# Patient Record
Sex: Male | Born: 1945 | Race: White | Hispanic: No | Marital: Married
Health system: Midwestern US, Community
[De-identification: ages and names within clinical notes are randomized; demographics above are authoritative.]

## PROBLEM LIST (undated history)

## (undated) DIAGNOSIS — I10 Essential (primary) hypertension: Secondary | ICD-10-CM

## (undated) DIAGNOSIS — N179 Acute kidney failure, unspecified: Secondary | ICD-10-CM

## (undated) DIAGNOSIS — Z515 Encounter for palliative care: Secondary | ICD-10-CM

## (undated) DIAGNOSIS — A0472 Enterocolitis due to Clostridium difficile, not specified as recurrent: Secondary | ICD-10-CM

## (undated) DIAGNOSIS — I2510 Atherosclerotic heart disease of native coronary artery without angina pectoris: Secondary | ICD-10-CM

## (undated) DIAGNOSIS — Z8639 Personal history of other endocrine, nutritional and metabolic disease: Secondary | ICD-10-CM

## (undated) DIAGNOSIS — D472 Monoclonal gammopathy: Secondary | ICD-10-CM

## (undated) DIAGNOSIS — R339 Retention of urine, unspecified: Secondary | ICD-10-CM

## (undated) DIAGNOSIS — Z7189 Other specified counseling: Secondary | ICD-10-CM

## (undated) DIAGNOSIS — R1010 Upper abdominal pain, unspecified: Secondary | ICD-10-CM

## (undated) DIAGNOSIS — R1084 Generalized abdominal pain: Secondary | ICD-10-CM

## (undated) DIAGNOSIS — E119 Type 2 diabetes mellitus without complications: Secondary | ICD-10-CM

## (undated) DIAGNOSIS — R531 Weakness: Secondary | ICD-10-CM

## (undated) DIAGNOSIS — D72829 Elevated white blood cell count, unspecified: Secondary | ICD-10-CM

## (undated) DIAGNOSIS — E785 Hyperlipidemia, unspecified: Secondary | ICD-10-CM

## (undated) DIAGNOSIS — C801 Malignant (primary) neoplasm, unspecified: Secondary | ICD-10-CM

## (undated) HISTORY — DX: Hyperlipidemia, unspecified: E78.5

## (undated) HISTORY — DX: Weakness: R53.1

## (undated) HISTORY — DX: Encounter for palliative care: Z51.5

## (undated) HISTORY — DX: Atherosclerotic heart disease of native coronary artery without angina pectoris: I25.10

## (undated) HISTORY — DX: Retention of urine, unspecified: R33.9

## (undated) HISTORY — DX: Generalized abdominal pain: R10.84

## (undated) HISTORY — DX: Enterocolitis due to Clostridium difficile, not specified as recurrent: A04.72

## (undated) HISTORY — DX: Other specified counseling: Z71.89

## (undated) HISTORY — DX: Upper abdominal pain, unspecified: R10.10

## (undated) HISTORY — DX: Acute kidney failure, unspecified: N17.9

## (undated) HISTORY — DX: Essential (primary) hypertension: I10

## (undated) HISTORY — DX: Elevated white blood cell count, unspecified: D72.829

## (undated) HISTORY — DX: Monoclonal gammopathy: D47.2

## (undated) HISTORY — DX: Personal history of other endocrine, nutritional and metabolic disease: Z86.39

---

## 2003-11-23 ENCOUNTER — Encounter: Admission: RE | Admit: 2003-11-23 | Discharge: 2003-11-23 | Payer: Self-pay | Admitting: Internal Medicine

## 2004-01-07 DIAGNOSIS — I2510 Atherosclerotic heart disease of native coronary artery without angina pectoris: Secondary | ICD-10-CM | POA: Insufficient documentation

## 2004-01-07 HISTORY — PX: CARDIAC CATHETERIZATION: SHX172

## 2004-01-07 HISTORY — DX: Atherosclerotic heart disease of native coronary artery without angina pectoris: I25.10

## 2004-04-15 ENCOUNTER — Emergency Department (HOSPITAL_COMMUNITY): Admission: EM | Admit: 2004-04-15 | Discharge: 2004-04-15 | Payer: Self-pay | Admitting: Emergency Medicine

## 2004-05-17 ENCOUNTER — Encounter: Payer: Self-pay | Admitting: Cardiology

## 2004-05-17 ENCOUNTER — Ambulatory Visit (HOSPITAL_COMMUNITY): Admission: RE | Admit: 2004-05-17 | Discharge: 2004-05-18 | Payer: Self-pay | Admitting: Cardiovascular Disease

## 2005-01-06 HISTORY — PX: CORONARY ANGIOPLASTY WITH STENT PLACEMENT: SHX49

## 2006-03-29 ENCOUNTER — Emergency Department (HOSPITAL_COMMUNITY): Admission: EM | Admit: 2006-03-29 | Discharge: 2006-03-30 | Payer: Self-pay | Admitting: Emergency Medicine

## 2011-07-21 DIAGNOSIS — E119 Type 2 diabetes mellitus without complications: Secondary | ICD-10-CM | POA: Diagnosis present

## 2011-07-21 DIAGNOSIS — I10 Essential (primary) hypertension: Secondary | ICD-10-CM | POA: Insufficient documentation

## 2011-07-21 DIAGNOSIS — I2510 Atherosclerotic heart disease of native coronary artery without angina pectoris: Secondary | ICD-10-CM

## 2011-07-21 HISTORY — DX: Atherosclerotic heart disease of native coronary artery without angina pectoris: I25.10

## 2011-07-21 HISTORY — DX: Type 2 diabetes mellitus without complications: E11.9

## 2011-07-21 HISTORY — DX: Essential (primary) hypertension: I10

## 2011-08-29 ENCOUNTER — Ambulatory Visit (INDEPENDENT_AMBULATORY_CARE_PROVIDER_SITE_OTHER): Payer: BC Managed Care – PPO | Admitting: Cardiology

## 2011-08-29 ENCOUNTER — Encounter: Payer: Self-pay | Admitting: Cardiology

## 2011-08-29 VITALS — BP 148/94 | HR 73 | Ht 71.0 in | Wt 170.0 lb

## 2011-08-29 DIAGNOSIS — E785 Hyperlipidemia, unspecified: Secondary | ICD-10-CM

## 2011-08-29 DIAGNOSIS — E119 Type 2 diabetes mellitus without complications: Secondary | ICD-10-CM

## 2011-08-29 DIAGNOSIS — I2510 Atherosclerotic heart disease of native coronary artery without angina pectoris: Secondary | ICD-10-CM

## 2011-08-29 DIAGNOSIS — I10 Essential (primary) hypertension: Secondary | ICD-10-CM

## 2011-08-29 DIAGNOSIS — Z8639 Personal history of other endocrine, nutritional and metabolic disease: Secondary | ICD-10-CM

## 2011-08-29 DIAGNOSIS — Z862 Personal history of diseases of the blood and blood-forming organs and certain disorders involving the immune mechanism: Secondary | ICD-10-CM

## 2011-08-29 DIAGNOSIS — E7800 Pure hypercholesterolemia, unspecified: Secondary | ICD-10-CM

## 2011-08-29 MED ORDER — AMLODIPINE BESYLATE 5 MG PO TABS: 5.0000 mg | ORAL_TABLET | Freq: Every day | ORAL | Status: DC

## 2011-08-29 MED ORDER — ATORVASTATIN CALCIUM 40 MG PO TABS: 40.0000 mg | ORAL_TABLET | Freq: Every day | ORAL | Status: DC

## 2011-08-29 MED ORDER — METFORMIN HCL 1000 MG PO TABS: 1000.0000 mg | ORAL_TABLET | Freq: Two times a day (BID) | ORAL | Status: DC

## 2011-08-29 NOTE — Progress Notes (Signed)
Douglas Rose. Date of Birth: 05-17-1945 Medical Record #[id number]  History of Present Illness: Mr. Delmonaco is seen at the request of Dr. Kathrynn Running for evaluation of coronary disease. 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 In 2006 he presented with symptoms of fatigue on exertion and chest pain. He had an abnormal stress test which led to cardiac catheterization. This demonstrated a occlusion of the right coronary with collateral flow. He also had a 95% stenosis in the proximal LAD. The LAD was stented with a 3.0 x 18 mm Cypher stent. He has been lost to cardiac followup since then. He has multiple cardiac risk factors including diabetes, hypertension, and hyperlipidemia. He states that he has been feeling well. He denies any significant fatigue, chest pain, or shortness of breath. His energy level is good. He has no history of stroke or TIA. His father did die of a stroke at age 62.  Current Outpatient Prescriptions on File Prior to Visit  Medication Sig Dispense Refill  . lisinopril-hydrochlorothiazide (PRINZIDE,ZESTORETIC) 20-25 MG per tablet Take 1 tablet by mouth daily.       . metoprolol succinate (TOPROL-XL) 50 MG 24 hr tablet Take 50 mg by mouth daily.       [name] amLODipine (NORVASC) 5 MG tablet Take 1 tablet (5 mg total) by mouth daily.  30 tablet  11  . atorvastatin (LIPITOR) 40 MG tablet Take 1 tablet (40 mg total) by mouth daily.  30 tablet  11  . metFORMIN (GLUCOPHAGE) 1000 MG tablet Take 1 tablet (1,000 mg total) by mouth 2 (two) times daily with a meal.  180 tablet  3    No Known Allergies  Past Medical History  Diagnosis Date  . H/O non-insulin dependent diabetes mellitus   . Hypertension   . Hyperlipidemia   . CAD (coronary artery disease) 2006    stent LAD 3.0x18 Cypher, occluded RCA    Past Surgical History  Procedure Date  . Cardiac catheterization 2006    History  Smoking status  . Never Smoker     Smokeless tobacco  . Not on file    History  Alcohol Use No    Family History  Problem Relation Age of Onset  . Arrhythmia Mother   . Diabetes Sister   . Diabetes Brother   . Diabetes Brother     Review of Systems: As noted in history of present illness. No history of diabetic neuropathy or nephropathy..  All other systems were reviewed and are negative.  Physical Exam: BP 148/94  Pulse 73  Ht 5\' 11"  (1.803 m)  Wt 170 lb (77.111 kg)  BMI 23.71 kg/m2 He is a pleasant black male in no acute distress.The patient is alert and oriented x 3.  The mood and affect are normal.  The skin is warm and dry.  Color is normal.  The HEENT exam reveals that the sclera are nonicteric.  The mucous membranes are moist.  The carotids are 2+ without bruits.  There is no thyromegaly.  There is no JVD.  The lungs are clear.  The chest wall is non tender.  The heart exam reveals a regular rate with a normal S1 and S2.  There are no murmurs, gallops, or rubs.  The PMI is not displaced.   Abdominal exam reveals good bowel sounds.  There is no guarding or rebound.  There is no hepatosplenomegaly or tenderness.  There are no masses.  Exam of  the legs reveal no clubbing, cyanosis, or edema.  The legs are without rashes.  The distal pulses are intact.  Cranial nerves II - XII are intact.  Motor and sensory functions are intact.  The gait is normal.  LABORATORY DATA:  ECG demonstrates normal sinus rhythm with possible left atrial enlargement. There is mild nonspecific ST abnormality.  Recent laboratory data from 07/21/2011 shows a glucose of 201, A1c of 8.5%. Cholesterol 224, triglycerides 96, HDL 65, LDL 140. Remainder of chemistry panel was normal. Assessment / Plan: 1. Coronary disease status post stenting of the LAD in 2006 with a drug-eluting stent. Chronic total occlusion of the RCA. Patient is without significant symptoms. I recommended a followup nuclear stress test at this point. I would anticipate that  there may be some ischemia in the inferior wall from his RCA occlusion but I think stress testing will still be beneficial to evaluate his overall risk status of his other vessels.  2. Diabetes mellitus type 2, poorly controlled. Target A1c of less than 7%. I recommended increasing his metformin to 1000 mg twice a day. Patient was given dietary information today.  3. Hypertension, poorly controlled. Continue lisinopril HCT and metoprolol at current doses. We will add amlodipine 5 mg daily.  4. Hypercholesterolemia. Patient is not at target on his current dose of simvastatin. Ideal target would be an LDL of 70. I recommended switching his simvastatin to Lipitor 40 mg per day. He will need repeat fasting lab work in 2-3 months. If still not at goal I would increase his Lipitor to 80 mg per day.

## 2011-08-29 NOTE — Patient Instructions (Signed)
We will add amlodipine 5 mg daily to your current medication for BP control. Check your blood pressure at home several times a week.  We will switch simvastatin to Lipitor 40 mg daily for cholesterol and recheck fasting lab work in 3 months.  Increase metformin to 1000 mg twice a day.  We will schedule you for a nuclear stress test.

## 2011-09-09 ENCOUNTER — Encounter (HOSPITAL_COMMUNITY): Payer: BC Managed Care – PPO

## 2011-09-11 ENCOUNTER — Ambulatory Visit (HOSPITAL_COMMUNITY): Payer: BC Managed Care – PPO | Attending: Cardiology | Admitting: Radiology

## 2011-09-11 VITALS — BP 141/70 | Ht 71.0 in | Wt 165.0 lb

## 2011-09-11 DIAGNOSIS — I10 Essential (primary) hypertension: Secondary | ICD-10-CM | POA: Insufficient documentation

## 2011-09-11 DIAGNOSIS — R9431 Abnormal electrocardiogram [ECG] [EKG]: Secondary | ICD-10-CM

## 2011-09-11 DIAGNOSIS — E7800 Pure hypercholesterolemia, unspecified: Secondary | ICD-10-CM

## 2011-09-11 DIAGNOSIS — E119 Type 2 diabetes mellitus without complications: Secondary | ICD-10-CM | POA: Insufficient documentation

## 2011-09-11 DIAGNOSIS — R002 Palpitations: Secondary | ICD-10-CM | POA: Insufficient documentation

## 2011-09-11 DIAGNOSIS — R0609 Other forms of dyspnea: Secondary | ICD-10-CM | POA: Insufficient documentation

## 2011-09-11 DIAGNOSIS — I2510 Atherosclerotic heart disease of native coronary artery without angina pectoris: Secondary | ICD-10-CM

## 2011-09-11 DIAGNOSIS — R0989 Other specified symptoms and signs involving the circulatory and respiratory systems: Secondary | ICD-10-CM

## 2011-09-11 MED ORDER — TECHNETIUM TC 99M TETROFOSMIN IV KIT
33.0000 | PACK | Freq: Once | INTRAVENOUS | Status: AC | PRN
  Administered 2011-09-11: 33 via INTRAVENOUS

## 2011-09-11 MED ORDER — TECHNETIUM TC 99M TETROFOSMIN IV KIT
11.0000 | PACK | Freq: Once | INTRAVENOUS | Status: AC | PRN
  Administered 2011-09-11: 11 via INTRAVENOUS

## 2011-09-11 NOTE — Progress Notes (Signed)
[facility] SITE 3 NUCLEAR MED [address] [place] [address] [phone]  Cardiology Nuclear Med Study  Douglas Rose. is a 66 y.o. male     MRN : [id number]     DOB: [birth year]  Procedure Date: 09/11/2011  Nuclear Med Background Indication for Stress Test:  Evaluation for Ischemia, Stent Patency, PTCA Patency and Abnormal EKG History:  '06  [facility]: occluded RCA  stent LAD-Angioplasty LAD  Cardiac Risk Factors: Hypertension, Lipids and NIDDM  Symptoms:  DOE and Palpitations   Nuclear Pre-Procedure Caffeine/Decaff Intake:  None NPO After: 9:00am   Lungs:  clear O2 Sat: 98% on room air. IV 0.9% NS with Angio Cath:  20g  IV Site: R Antecubital  IV Started by:  Stanton Kidney, EMT-P  Chest Size (in):  42 Cup Size: n/a  Height: 5\' 11"  (1.803 m)  Weight:  165 lb (74.844 kg)  BMI:  Body mass index is 23.01 kg/(m^2). Tech Comments:  Toprol held > 48 hours, per patient.    Nuclear Med Study 1 or 2 day study: 1 day  Stress Test Type:  Stress  Reading MD: Cassell Clement, MD  Order Authorizing Provider:  P.Jordan  Resting Radionuclide: Technetium 65m Tetrofosmin  Resting Radionuclide Dose: 11.0 mCi   Stress Radionuclide:  Technetium 19m Tetrofosmin  Stress Radionuclide Dose: 33.0 mCi           Stress Protocol Rest HR: 71 Stress HR: 153  Rest BP: 141/70 Stress BP: 225/98  Exercise Time (min): 6:30 METS: 7.70   Predicted Max HR: 153 bpm % Max HR: 100 bpm Rate Pressure Product: 56387   Dose of Adenosine (mg):  n/a Dose of Lexiscan: n/a mg  Dose of Atropine (mg): n/a Dose of Dobutamine: n/a mcg/kg/min (at max HR)  Stress Test Technologist: Milana Na, EMT-P  Nuclear Technologist:  Domenic Polite, CNMT     Rest Procedure:  Myocardial perfusion imaging was performed at rest 45 minutes following the intravenous administration of Technetium 79m Tetrofosmin. Rest ECG: NSR - Normal EKG  Stress Procedure:  The  patient performed treadmill exercise using a Bruce  Protocol for 6:30 minutes. The patient stopped due to fatigue and denied any chest pain.  There were + significant ST-T wave changes and rare pvcs.  Technetium 45m Tetrofosmin was injected at peak exercise and myocardial perfusion imaging was performed after a brief delay. Stress ECG: Insignificant upsloping ST depression in inferior leads at peak exercise  QPS Raw Data Images:  Normal; no motion artifact; normal heart/lung ratio. Stress Images:  Normal homogeneous uptake in all areas of the myocardium. Rest Images:  Normal homogeneous uptake in all areas of the myocardium. Subtraction (SDS):  No evidence of ischemia. Transient Ischemic Dilatation (Normal <1.22):  1.01 Lung/Heart Ratio (Normal <0.45):  0.30  Quantitative Gated Spect Images QGS EDV:  78 ml QGS ESV:  34 ml  Impression Exercise Capacity:  Good exercise capacity. BP Response:  Hypertensive blood pressure response. Clinical Symptoms:  No significant symptoms noted. ECG Impression:  Insignificant upsloping ST segment depression. Comparison with Prior Nuclear Study: No images to compare  Overall Impression:  Normal stress nuclear study.  LV Ejection Fraction: 56%.  LV Wall Motion:  NL LV Function; NL Wall Motion  [employer]

## 2011-11-05 ENCOUNTER — Other Ambulatory Visit: Payer: Self-pay | Admitting: *Deleted

## 2011-11-05 DIAGNOSIS — I2510 Atherosclerotic heart disease of native coronary artery without angina pectoris: Secondary | ICD-10-CM

## 2011-11-05 DIAGNOSIS — E785 Hyperlipidemia, unspecified: Secondary | ICD-10-CM

## 2011-11-05 DIAGNOSIS — Z8639 Personal history of other endocrine, nutritional and metabolic disease: Secondary | ICD-10-CM

## 2011-11-05 DIAGNOSIS — I10 Essential (primary) hypertension: Secondary | ICD-10-CM

## 2011-11-11 ENCOUNTER — Other Ambulatory Visit: Payer: BC Managed Care – PPO

## 2012-11-12 ENCOUNTER — Encounter (HOSPITAL_COMMUNITY): Payer: Self-pay | Admitting: Emergency Medicine

## 2012-11-12 ENCOUNTER — Emergency Department (HOSPITAL_COMMUNITY): Payer: BC Managed Care – PPO

## 2012-11-12 ENCOUNTER — Observation Stay (HOSPITAL_COMMUNITY)
Admission: EM | Admit: 2012-11-12 | Discharge: 2012-11-13 | Disposition: A | Discharge status: Home / Self Care | Payer: BC Managed Care – PPO | Attending: Cardiology | Admitting: Cardiology

## 2012-11-12 DIAGNOSIS — D649 Anemia, unspecified: Secondary | ICD-10-CM | POA: Insufficient documentation

## 2012-11-12 DIAGNOSIS — I10 Essential (primary) hypertension: Secondary | ICD-10-CM | POA: Insufficient documentation

## 2012-11-12 DIAGNOSIS — R079 Chest pain, unspecified: Secondary | ICD-10-CM

## 2012-11-12 DIAGNOSIS — I2510 Atherosclerotic heart disease of native coronary artery without angina pectoris: Secondary | ICD-10-CM

## 2012-11-12 DIAGNOSIS — Z9119 Patient's noncompliance with other medical treatment and regimen: Secondary | ICD-10-CM | POA: Insufficient documentation

## 2012-11-12 DIAGNOSIS — I208 Other forms of angina pectoris: Secondary | ICD-10-CM

## 2012-11-12 DIAGNOSIS — Z9861 Coronary angioplasty status: Secondary | ICD-10-CM | POA: Insufficient documentation

## 2012-11-12 DIAGNOSIS — Z79899 Other long term (current) drug therapy: Secondary | ICD-10-CM | POA: Insufficient documentation

## 2012-11-12 DIAGNOSIS — Z91199 Patient's noncompliance with other medical treatment and regimen due to unspecified reason: Secondary | ICD-10-CM | POA: Insufficient documentation

## 2012-11-12 DIAGNOSIS — E119 Type 2 diabetes mellitus without complications: Secondary | ICD-10-CM | POA: Insufficient documentation

## 2012-11-12 DIAGNOSIS — E785 Hyperlipidemia, unspecified: Secondary | ICD-10-CM

## 2012-11-12 LAB — BASIC METABOLIC PANEL
CO2: 26 mEq/L (ref 19–32)
Chloride: 101 mEq/L (ref 96–112)
GFR calc Af Amer: 83 mL/min — ABNORMAL LOW (ref >90)
Potassium: 4.1 mEq/L (ref 3.5–5.1)

## 2012-11-12 LAB — CBC WITH DIFFERENTIAL/PLATELET
Basophils Relative: 0 % (ref 0–1)
Eosinophils Absolute: 0.1 10*3/uL (ref 0.0–0.7)
Eosinophils Relative: 1 % (ref 0–5)
Lymphocytes Relative: 20 % (ref 12–46)
Lymphs Abs: 1 10*3/uL (ref 0.7–4.0)
MCH: 29.4 pg (ref 26.0–34.0)
MCHC: 35.8 g/dL (ref 30.0–36.0)
MCV: 82 fL (ref 78.0–100.0)
Neutrophils Relative %: 71 % (ref 43–77)
Platelets: 188 10*3/uL (ref 150–400)
RDW: 14 % (ref 11.5–15.5)
WBC: 4.9 10*3/uL (ref 4.0–10.5)

## 2012-11-12 LAB — TROPONIN I: Troponin I: <0.3 ng/mL (ref <0.30)

## 2012-11-12 MED ORDER — SODIUM CHLORIDE 0.9 % IJ SOLN: 3.0000 mL | Freq: Two times a day (BID) | INTRAMUSCULAR | Status: DC

## 2012-11-12 MED ORDER — ASPIRIN 81 MG PO CHEW: 324.0000 mg | CHEWABLE_TABLET | Freq: Once | ORAL | Status: DC

## 2012-11-12 MED ORDER — METOPROLOL SUCCINATE ER 50 MG PO TB24
50.0000 mg | ORAL_TABLET | Freq: Every day | ORAL | Status: DC
  Administered 2012-11-13: 50 mg via ORAL
  Filled 2012-11-12: qty 1

## 2012-11-12 MED ORDER — SODIUM CHLORIDE 0.9 % IV SOLN: 250.0000 mL | INTRAVENOUS | Status: DC | PRN

## 2012-11-12 MED ORDER — LISINOPRIL 20 MG PO TABS
20.0000 mg | ORAL_TABLET | Freq: Every day | ORAL | Status: DC
  Administered 2012-11-13: 20 mg via ORAL
  Filled 2012-11-12: qty 1

## 2012-11-12 MED ORDER — HYDROCHLOROTHIAZIDE 25 MG PO TABS
25.0000 mg | ORAL_TABLET | Freq: Every day | ORAL | Status: DC
  Administered 2012-11-13: 25 mg via ORAL
  Filled 2012-11-12: qty 1

## 2012-11-12 MED ORDER — LISINOPRIL-HYDROCHLOROTHIAZIDE 20-25 MG PO TABS: 1.0000 | ORAL_TABLET | Freq: Every day | ORAL | Status: DC

## 2012-11-12 MED ORDER — ACETAMINOPHEN 325 MG PO TABS: 650.0000 mg | ORAL_TABLET | ORAL | Status: DC | PRN

## 2012-11-12 MED ORDER — SODIUM CHLORIDE 0.9 % IJ SOLN: 3.0000 mL | INTRAMUSCULAR | Status: DC | PRN

## 2012-11-12 MED ORDER — ONDANSETRON HCL 4 MG/2ML IJ SOLN: 4.0000 mg | Freq: Four times a day (QID) | INTRAMUSCULAR | Status: DC | PRN

## 2012-11-12 MED ORDER — INSULIN ASPART 100 UNIT/ML ~~LOC~~ SOLN
0.0000 [IU] | Freq: Three times a day (TID) | SUBCUTANEOUS | Status: DC
  Administered 2012-11-13 (×2): 2 [IU] via SUBCUTANEOUS
  Administered 2012-11-13: 3 [IU] via SUBCUTANEOUS

## 2012-11-12 MED ORDER — NITROGLYCERIN 0.4 MG SL SUBL: 0.4000 mg | SUBLINGUAL_TABLET | SUBLINGUAL | Status: DC | PRN

## 2012-11-12 MED ORDER — ASPIRIN 81 MG PO CHEW
81.0000 mg | CHEWABLE_TABLET | Freq: Every day | ORAL | Status: DC
  Administered 2012-11-13: 81 mg via ORAL
  Filled 2012-11-12: qty 1

## 2012-11-12 NOTE — ED Provider Notes (Signed)
CSN: [id number]     Arrival date & time 11/12/12  1929 History   First MD Initiated Contact with Patient 11/12/12 1933     No chief complaint on file.  (Consider location/radiation/quality/duration/timing/severity/associated sxs/prior Treatment) HPI  The patient presents to the ED for chest pain that started approx an hour prior to arrival. He has a hx of stent placement for significant occlusions 7 years ago. He has had poor follow-up since then but saw Dr. Swaziland with [facility] cardiology last year. He has not been returning due to costs of appointments. His risk factors are diabetes, hypertension, hyperlipidemia. Up until today he has been feeling well. He was carrying his baby granddaughter up two flights of stairs when the symptoms slowly started, he then went back downstairs and then up the flights of stairs a second time carrying groceries. The chest tightness was severe at this point at which he sat down to rest and the pain slowly eased off until it completely resolved. His wife called EMS. The episode of pain lasted a total of 20 minutes. He has had no pain in the ER. EMS gave 1 nitro and 325 baby aspirin.     Past Medical History  Diagnosis Date  . H/O non-insulin dependent diabetes mellitus   . Hypertension   . Hyperlipidemia   . CAD (coronary artery disease) 2006    stent LAD 3.0x18 Cypher, occluded RCA   Past Surgical History  Procedure Laterality Date  . Cardiac catheterization  2006   Family History  Problem Relation Age of Onset  . Arrhythmia Mother   . Diabetes Sister   . Diabetes Brother   . Diabetes Brother    History  Substance Use Topics  . Smoking status: Never Smoker   . Smokeless tobacco: Never Used  . Alcohol Use: No    Review of Systems The patient denies anorexia, fever, weight loss,, vision loss, decreased hearing, hoarseness, chest pain, syncope, dyspnea on exertion, peripheral edema, balance deficits, hemoptysis, abdominal pain, melena,  hematochezia, severe indigestion/heartburn, hematuria, incontinence, genital sores, muscle weakness, suspicious skin lesions, transient blindness, difficulty walking, depression, unusual weight change, abnormal bleeding, enlarged lymph nodes, angioedema, and breast masses.  Allergies  Review of patient's allergies indicates no known allergies.  Home Medications   Current Outpatient Rx  Name  Route  Sig  Dispense  Refill  . aspirin 81 MG tablet   Oral   Take 81 mg by mouth daily.         [name] glipiZIDE (GLUCOTROL) 5 MG tablet   Oral   Take 10 mg by mouth every morning.          [name] lisinopril-hydrochlorothiazide (PRINZIDE,ZESTORETIC) 20-25 MG per tablet   Oral   Take 1 tablet by mouth daily.          . metFORMIN (GLUCOPHAGE) 1000 MG tablet   Oral   Take 1,000 mg by mouth 2 (two) times daily with a meal.         . metoprolol succinate (TOPROL-XL) 50 MG 24 hr tablet   Oral   Take 50 mg by mouth daily.          . sildenafil (VIAGRA) 50 MG tablet   Oral   Take 50 mg by mouth daily as needed for erectile dysfunction.          BP 165/87  Pulse 78  Temp(Src) 98.1 F (36.7 C) (Oral)  Resp 16  SpO2 100% Physical Exam  Nursing note and vitals reviewed. Constitutional: He  appears well-developed and well-nourished. No distress.  HENT:  Head: Normocephalic and atraumatic.  Eyes: Pupils are equal, round, and reactive to light.  Neck: Normal range of motion. Neck supple.  Cardiovascular: Normal rate and regular rhythm.   Pulmonary/Chest: Effort normal.  Abdominal: Soft.  Neurological: He is alert.  Skin: Skin is warm and dry.    ED Course  Procedures (including critical care time) Labs Review Labs Reviewed  CBC WITH DIFFERENTIAL - Abnormal; Notable for the following:    Hemoglobin 12.9 (*)    HCT 36.0 (*)    All other components within normal limits  BASIC METABOLIC PANEL - Abnormal; Notable for the following:    Glucose, Bld 105 (*)    GFR calc non Af Amer 72  (*)    GFR calc Af Amer 83 (*)    All other components within normal limits  TROPONIN I   Imaging Review Dg Chest 2 View  11/12/2012   CLINICAL DATA:  Mid chest pain starting today  EXAM: CHEST  2 VIEW  COMPARISON:  05/17/2004  FINDINGS: Cardiac silhouette mildly enlarged but stable. Vascular pattern normal. Lungs clear.  IMPRESSION: No active cardiopulmonary disease.   Electronically Signed   By: Esperanza Heir M.D.   On: 11/12/2012 20:46    EKG Interpretation     Ventricular Rate:  76 PR Interval:  193 QRS Duration: 80 QT Interval:  368 QTC Calculation: 414 R Axis:   -16 Text Interpretation:  Sinus rhythm Borderline left axis deviation RSR' in V1 or V2, probably normal variant ST elevation, consider inferior injury            MDM   1. Exertional angina     Patients ekg and labs are WNL. Due to exertional angina symptoms patient does not meet criteria for discharge at this time. I discussed with Dr. Adriana Simas and he agrees that patient should be admitted.  [facility] cardiology has agreed to come see patient and will most likely admit.  Patient and family members have been made aware of results and plan    Dorthula Matas, PA-C 11/12/12 2136

## 2012-11-12 NOTE — ED Notes (Signed)
Patient transported to X-ray 

## 2012-11-12 NOTE — ED Notes (Signed)
Family at bedside. 

## 2012-11-12 NOTE — ED Notes (Signed)
According to EMS, patient had been at the grocery store and came home and was carrying baby up stairs and he began experiencing pain in his chest.  The pain started on the right side of his chest and it radiated to his left and down his arm.  Denies N/V, SOB, dizziness and lightheadedness.  He describes his pain as tight and rated it at a 7/10.  He took some baby aspirins at home and EMS gave him one Nitro.  Pain now is a 0/10.  Patient does have a prescription for Viagra but he denies taking any in the last 36hours.

## 2012-11-12 NOTE — H&P (Addendum)
History and Physical  Patient ID: Douglas Rose. MRN: [id number], SOB: [birth year] 67 y.o. Date of Encounter: 11/12/2012, 9:38 PM  Primary Physician: Arlan Organ., MD Primary Cardiologist: Dr. Swaziland  Chief Complaint: chest pain  HPI: 67 y.o. male w/ PMHx significant for known CAD s/p PCI to LAD, known RCA occlusion 2006, DM, HTN and hyperlipidemia who presented to [facility] on 11/12/2012 with complaints of chest pain.  Last seen by [facility] cardiology 08/2011 presenting without symptoms. For risk stratification, he underwent an exercise MPI which did not demonstrate any ischemia by imaging, EF of 56%. Exercised 6 minutes with hypertensive response, upsloping EKG depression.  In 2006, his presentation was chest pain associated with moving heavy objects out of his car. Cath at that time demonstated RCA occlusion and LAD 95% that was stented with DES. Resolution of his symptoms. His presentation today is very similar to his symptoms then with a 20-30 minute episode of chest pressure that occurred with lifting his granddaughter and carry her up a half flight of stairs. Noted tingling sensation in his left elbow/arm which also concerned him. No other assoc symptoms (diaphoresis, presyncope, nausea). With resting, pain subsided substantially and resolved with a nitro provided by EMS. Asa given en route. Also noted at the time to have blood pressures in the 170s-190s.  He has been out of his metoprolol and atorvastatin for the last week as he has been out of town. Compliant with other medications.  No PND, orthopnea. Not physically active but is not limited by chest pain previously. No viagra use in > 6 months,.  EKG revealed NSR with j point elevation in V2, I, avf. CXR was without acute cardiopulmonary abnormalities. Labs are significant for negative troponin, mild normocytic anemia.   Past Medical History  Diagnosis Date  . H/O non-insulin dependent diabetes mellitus   .  Hypertension   . Hyperlipidemia   . CAD (coronary artery disease) 2006    stent LAD 3.0x18 Cypher, occluded RCA     Surgical History:  Past Surgical History  Procedure Laterality Date  . Cardiac catheterization  2006     Home Meds: Prior to Admission medications   Medication Sig Start Date End Date Taking? Authorizing Provider  aspirin 81 MG tablet Take 81 mg by mouth daily.   Yes Historical Provider, MD  glipiZIDE (GLUCOTROL) 5 MG tablet Take 10 mg by mouth every morning.  10/25/12  Yes Historical Provider, MD  lisinopril-hydrochlorothiazide (PRINZIDE,ZESTORETIC) 20-25 MG per tablet Take 1 tablet by mouth daily.  07/21/11  Yes Historical Provider, MD  metFORMIN (GLUCOPHAGE) 1000 MG tablet Take 1,000 mg by mouth 2 (two) times daily with a meal.   Yes Historical Provider, MD  metoprolol succinate (TOPROL-XL) 50 MG 24 hr tablet Take 50 mg by mouth daily.  07/21/11  Yes Historical Provider, MD  sildenafil (VIAGRA) 50 MG tablet Take 50 mg by mouth daily as needed for erectile dysfunction.   Yes Historical Provider, MD    Allergies: No Known Allergies  History   Social History  . Marital Status: Married    Spouse Name: N/A    Number of Children: 6  . Years of Education: N/A   Occupational History  . retired Optician, dispensing    Social History Main Topics  . Smoking status: Never Smoker   . Smokeless tobacco: Never Used  . Alcohol Use: No  . Drug Use: No  . Sexual Activity: Not on file   Other Topics Concern  . Not on  file   Social History Narrative  . No narrative on file     Family History  Problem Relation Age of Onset  . Arrhythmia Mother   . Diabetes Sister   . Diabetes Brother   . Diabetes Brother    Review of Systems: General: negative for chills, fever, night sweats or weight changes.  Cardiovascular: see HPI. Dermatological: negative for rash Respiratory: negative for cough or wheezing Urologic: negative for hematuria Abdominal: negative for nausea, vomiting,  diarrhea, bright red blood per rectum, melena, or hematemesis Neurologic: negative for visual changes, syncope, or dizziness All other systems reviewed and are otherwise negative except as noted above.  Labs:   Lab Results  Component Value Date   WBC 4.9 11/12/2012   HGB 12.9* 11/12/2012   HCT 36.0* 11/12/2012   MCV 82.0 11/12/2012   PLT 188 11/12/2012    Recent Labs Lab 11/12/12 2010  NA 138  K 4.1  CL 101  CO2 26  BUN 19  CREATININE 1.04  CALCIUM 9.2  GLUCOSE 105*    Recent Labs  11/12/12 2009  TROPONINI <0.30   No results found for this basename: CHOL, HDL, LDLCALC, TRIG   No results found for this basename: DDIMER    Radiology/Studies:  Dg Chest 2 View  11/12/2012   CLINICAL DATA:  Mid chest pain starting today  EXAM: CHEST  2 VIEW  COMPARISON:  05/17/2004  FINDINGS: Cardiac silhouette mildly enlarged but stable. Vascular pattern normal. Lungs clear.  IMPRESSION: No active cardiopulmonary disease.   Electronically Signed   By: Esperanza Heir M.D.   On: 11/12/2012 20:46     EKG: EKG revealed NSR with j point elevation in V2, I, avf. Unchanged from EMS recording.  Physical Exam: Blood pressure 165/87, pulse 78, temperature 98.1 F (36.7 C), temperature source Oral, resp. rate 16, SpO2 100.00%. General: Well developed, well nourished, in no acute distress. Head: Normocephalic, atraumatic, sclera non-icteric, nares are without discharge Neck: Supple. Negative for carotid bruits. JVD not elevated. Lungs: Clear bilaterally to auscultation without wheezes, rales, or rhonchi. Breathing is unlabored. Heart: RRR with S1 S2. No murmurs, rubs, or gallops appreciated. Abdomen: Soft, non-tender, non-distended with normoactive bowel sounds. No rebound/guarding. No obvious abdominal masses. Msk:  Strength and tone appear normal for age. Extremities: No edema. No clubbing or cyanosis. Distal pedal pulses are 2+ and equal bilaterally. Neuro: Alert and oriented X 3. Moves all  extremities spontaneously. Psych:  Responds to questions appropriately with a normal affect.   1. Chest pain consistent with accelerating angina 2. Known CAD s/p PCI 2006 3. HTN, poorly controlled due to being off meds 4. Diabetes 5. Hyperlipidemia 6. Mild normocytic anemia.  ASSESSMENT AND PLAN:   67 y.o. male w/ PMHx significant for known CAD s/p PCI to LAD, known RCA occlusion 2006, DM, HTN and hyperlipidemia who presented to [facility] on 11/12/2012 with complaints of chest pain --> classic symptoms of accelerating angina.  Encouragingly, is chest pain free, without EKG changes and negative initial troponin. Warrants observation with telemetry and serial enzymes. Disposition in the AM depends upon overnight course (inpatient vs. outpt stress vs. Catheterization). Symptoms possibly provoked by having underlying disease but being off his some of his meds x 1 week and having poorly controlled hypertension. Re-instate his medications of ACEI, HCTZ, beta blocker and statin. Continue aspirin. No heparin unless recurrent symptoms or + troponin.  Hold oral hypoglycemics while NPO. Cover with SSI.   Mild anemia- no evidence of blood  loss. Needs to ensure that he is up to date with colon CA screeening.  Discontinue outpt viagra at discharge.  Prophylaxis: Early ambulation NPO for possible stress in AM (exercise with imaging).  Full code.  Signed, Adolm Lula, Troy Sine MD 11/12/2012, 9:38 PM

## 2012-11-12 NOTE — ED Notes (Signed)
Dr. Adolm Ziquan, MD at bedside.

## 2012-11-12 NOTE — ED Notes (Signed)
Patient transported to CT 

## 2012-11-12 NOTE — ED Notes (Signed)
Patient is resting comfortably. 

## 2012-11-13 DIAGNOSIS — I209 Angina pectoris, unspecified: Secondary | ICD-10-CM

## 2012-11-13 DIAGNOSIS — I10 Essential (primary) hypertension: Secondary | ICD-10-CM

## 2012-11-13 DIAGNOSIS — E785 Hyperlipidemia, unspecified: Secondary | ICD-10-CM

## 2012-11-13 DIAGNOSIS — I2510 Atherosclerotic heart disease of native coronary artery without angina pectoris: Secondary | ICD-10-CM

## 2012-11-13 LAB — TROPONIN I
Troponin I: <0.3 ng/mL (ref <0.30)
Troponin I: <0.3 ng/mL (ref <0.30)

## 2012-11-13 LAB — BASIC METABOLIC PANEL
BUN: 16 mg/dL (ref 6–23)
CO2: 28 mEq/L (ref 19–32)
Calcium: 9.1 mg/dL (ref 8.4–10.5)
Creatinine, Ser: 1.08 mg/dL (ref 0.50–1.35)
GFR calc non Af Amer: 69 mL/min — ABNORMAL LOW (ref >90)
Glucose, Bld: 175 mg/dL — ABNORMAL HIGH (ref 70–99)
Sodium: 139 mEq/L (ref 135–145)

## 2012-11-13 LAB — LIPID PANEL
Cholesterol: 143 mg/dL (ref 0–200)
LDL Cholesterol: 74 mg/dL (ref 0–99)
Triglycerides: 122 mg/dL (ref <150)
VLDL: 24 mg/dL (ref 0–40)

## 2012-11-13 LAB — CBC
HCT: 36.1 % — ABNORMAL LOW (ref 39.0–52.0)
Hemoglobin: 12.5 g/dL — ABNORMAL LOW (ref 13.0–17.0)
MCH: 28.3 pg (ref 26.0–34.0)
MCHC: 34.6 g/dL (ref 30.0–36.0)
MCV: 81.7 fL (ref 78.0–100.0)
RBC: 4.42 MIL/uL (ref 4.22–5.81)

## 2012-11-13 LAB — HEMOGLOBIN A1C: Mean Plasma Glucose: 232 mg/dL — ABNORMAL HIGH (ref <117)

## 2012-11-13 LAB — PROTIME-INR: INR: 0.94 (ref 0.00–1.49)

## 2012-11-13 LAB — GLUCOSE, CAPILLARY: Glucose-Capillary: 150 mg/dL — ABNORMAL HIGH (ref 70–99)

## 2012-11-13 MED ORDER — NITROGLYCERIN 0.4 MG SL SUBL: 0.4000 mg | SUBLINGUAL_TABLET | SUBLINGUAL | Status: AC | PRN

## 2012-11-13 NOTE — Discharge Summary (Signed)
Please refer to my full rounding note for details.

## 2012-11-13 NOTE — Discharge Summary (Signed)
CARDIOLOGY DISCHARGE SUMMARY    Patient ID: Douglas Rose.,  MRN: [id number], DOB/AGE: 1945-04-21 67 y.o.  Admit date: 11/12/2012 Discharge date: 11/13/2012  Primary Care Physician: Aleatha Borer, MD Primary Cardiologist: Peter Swaziland, MD  Primary Discharge Diagnosis:  1. Chest pain - in setting of medical noncompliance; resolved, CEs negative 2. Medical noncompliance  Secondary Discharge Diagnoses:  1. CAD s/p DES to LAD in 2006, known occluded RCA 2. DM 3. HTN 4. Dyslipidemia  Procedures This Admission:  None  History and Hospital Course:  [name] is a 67 year old man with CAD, HTN, dyslipidemia and DM who presented with CP. He has been noncompliant with his metoprolol. He had an episode of CP lasting approximately 15 minutes after walking up and down steps a few times yesterday. This resolved and he has not had any recurrence. As noted above, he has been out of beta blocker for one week and has known CAD as detailed above. He was restarted on metoprolol. He was observed overnight. MI was ruled out with serial negative troponin. He remained hemodynamically stable. He is ambulating without difficulty. He has been seen, examined and deemed stable for discharge today by Dr. Diona Browner. He will have an outpatient LexiScan nuclear stress test in the next 1-2 weeks.   Discharge Vitals: Blood pressure 132/78, pulse 67, temperature 97.5 F (36.4 C), temperature source Oral, resp. rate 19, height 5\' 11"  (1.803 m), weight 169 lb 12.1 oz (77 kg), SpO2 100.00%.   Labs: Lab Results  Component Value Date   WBC 3.7* 11/13/2012   HGB 12.5* 11/13/2012   HCT 36.1* 11/13/2012   MCV 81.7 11/13/2012   PLT 183 11/13/2012    Recent Labs Lab 11/13/12 0220  NA 139  K 3.3*  CL 102  CO2 28  BUN 16  CREATININE 1.08  CALCIUM 9.1  GLUCOSE 175*   Lab Results  Component Value Date   TROPONINI <0.30 11/13/2012    Lab Results  Component Value Date   CHOL 143 11/13/2012   Lab Results    Component Value Date   HDL 45 11/13/2012   Lab Results  Component Value Date   LDLCALC 74 11/13/2012   Lab Results  Component Value Date   TRIG 122 11/13/2012   Lab Results  Component Value Date   CHOLHDL 3.2 11/13/2012   Recent Labs  11/13/12 0220  INR 0.94    Disposition:  The patient is being discharged in stable condition.  Follow-up: Follow-up Information   Follow up with [facility] In 1 week. (For stress test; Our office will call you with your appointment date and time)    Specialty:  Cardiology   Contact information:   [address], [address] [place] [address] [phone]      Follow up with Norma Fredrickson, NP On 12/06/2012. (At 3:00 PM for hospital follow-up)    Specialty:  Nurse Practitioner   Contact information:   [address] [place] [address] [phone]      Discharge Medications:    Medication List    STOP taking these medications       sildenafil 50 MG tablet  Commonly known as:  VIAGRA      TAKE these medications       aspirin 81 MG tablet  Take 81 mg by mouth daily.     glipiZIDE 5 MG tablet  Commonly known as:  GLUCOTROL  Take 10 mg by mouth every  morning.     lisinopril-hydrochlorothiazide 20-25 MG per tablet  Commonly known as:  PRINZIDE,ZESTORETIC  Take 1 tablet by mouth daily.     metFORMIN 1000 MG tablet  Commonly known as:  GLUCOPHAGE  Take 1,000 mg by mouth 2 (two) times daily with a meal.     metoprolol succinate 50 MG 24 hr tablet  Commonly known as:  TOPROL-XL  Take 50 mg by mouth daily.     nitroGLYCERIN 0.4 MG SL tablet  Commonly known as:  NITROSTAT  Place 1 tablet (0.4 mg total) under the tongue every 5 (five) minutes x 3 doses as needed for chest pain.       Duration of Discharge Encounter: Greater than 30 minutes including physician time.  Signed, Rick Duff, PA-C 11/13/2012, 3:50 PM

## 2012-11-13 NOTE — Progress Notes (Signed)
    Primary cardiologist: Dr. Peter Swaziland  Subjective:    Comfortable this morning, no recurrent chest pain. No breathlessness at rest.  Objective:   Temp:  [97.7 F (36.5 C)-98.1 F (36.7 C)] 97.7 F (36.5 C) (11/08 0340) Pulse Rate:  [68-81] 68 (11/08 0340) Resp:  [15-21] 19 (11/08 0340) BP: (138-197)/(80-98) 138/80 mmHg (11/08 0340) SpO2:  [98 %-100 %] 100 % (11/08 0340) Weight:  [169 lb 12.1 oz (77 kg)] 169 lb 12.1 oz (77 kg) (11/07 2344) Last BM Date: 11/12/12  Filed Weights   11/12/12 2344  Weight: 169 lb 12.1 oz (77 kg)    Intake/Output Summary (Last 24 hours) at 11/13/12 0852 Last data filed at 11/13/12 0700  Gross per 24 hour  Intake      0 ml  Output      0 ml  Net      0 ml    Telemetry: Sinus rhythm.  Exam:  General: Comfortable.  Lungs: Clear, nonlabored.  Cardiac: RRR, no gallop.  Extremities: No pitting edema.  Lab Results:  Basic Metabolic Panel:  Recent Labs Lab 11/12/12 2010 11/13/12 0220  NA 138 139  K 4.1 3.3*  CL 101 102  CO2 26 28  GLUCOSE 105* 175*  BUN 19 16  CREATININE 1.04 1.08  CALCIUM 9.2 9.1    CBC:  Recent Labs Lab 11/12/12 2010 11/13/12 0220  WBC 4.9 3.7*  HGB 12.9* 12.5*  HCT 36.0* 36.1*  MCV 82.0 81.7  PLT 188 183    Cardiac Enzymes:  Recent Labs Lab 11/12/12 2009 11/13/12 0540  TROPONINI <0.30 <0.30    ECG: Sinus rhythm with decreased R-wave progression and nonspecific ST changes.   Medications:   Scheduled Medications: . aspirin  81 mg Oral Daily  . hydrochlorothiazide  25 mg Oral Daily  . insulin aspart  0-15 Units Subcutaneous TID WC  . lisinopril  20 mg Oral Daily  . metoprolol succinate  50 mg Oral Daily  . sodium chloride  3 mL Intravenous Q12H     PRN Medications:  sodium chloride, acetaminophen, nitroGLYCERIN, ondansetron (ZOFRAN) IV, sodium chloride   Assessment:   1. Angina complicated by medication noncompliance, out of Lipitor and Lopressor for a week. Cardiac  markers argue against ACS.  2. CAD status post DES to LAD in 2006, known occluded RCA.  3. Hyperlipidemia.  4. Type 2 diabetes mellitus.   Plan/Discussion:    Discussed history with patient this morning. He tells that he had an episode of angina lasting approximately 15 minutes after walking up and down steps a few times on the day of presentation, none since then. As noted above, he has been out of beta blocker for a week and has known CAD as detailed above. Plan is to have him ambulate this morning and await last troponin I. If he remains clinically stable with normal cardiac markers, he can likely be discharged home for an outpatient followup Lexiscan Cardiolite on medical therapy next week and visit with Dr. Swaziland. Otherwise, we will keep him for further inpatient evaluation.   Jonelle Sidle, M.D., F.A.C.C.

## 2012-11-16 NOTE — ED Provider Notes (Signed)
Medical screening examination/treatment/procedure(s) were conducted as a shared visit with non-physician practitioner(s) and myself.  I personally evaluated the patient during the encounter.  EKG Interpretation     Ventricular Rate:  76 PR Interval:  193 QRS Duration: 80 QT Interval:  368 QTC Calculation: 414 R Axis:   -16 Text Interpretation:  Sinus rhythm Borderline left axis deviation RSR' in V1 or V2, probably normal variant ST elevation, consider inferior injury           Patient has chest pain with exertion with associated history of coronary artery disease. Admit  Donnetta Hutching, MD 11/16/12 1004

## 2012-12-06 ENCOUNTER — Encounter: Payer: Self-pay | Admitting: Nurse Practitioner

## 2012-12-06 ENCOUNTER — Ambulatory Visit (INDEPENDENT_AMBULATORY_CARE_PROVIDER_SITE_OTHER): Payer: BC Managed Care – PPO | Admitting: Nurse Practitioner

## 2012-12-06 VITALS — BP 160/90 | HR 79 | Ht 71.0 in | Wt 173.4 lb

## 2012-12-06 DIAGNOSIS — I2510 Atherosclerotic heart disease of native coronary artery without angina pectoris: Secondary | ICD-10-CM

## 2012-12-06 DIAGNOSIS — I10 Essential (primary) hypertension: Secondary | ICD-10-CM

## 2012-12-06 MED ORDER — ATORVASTATIN CALCIUM 20 MG PO TABS: 20.0000 mg | ORAL_TABLET | Freq: Every day | ORAL | Status: DC

## 2012-12-06 MED ORDER — AMLODIPINE BESYLATE 5 MG PO TABS: 5.0000 mg | ORAL_TABLET | Freq: Every day | ORAL | Status: DC

## 2012-12-06 NOTE — Progress Notes (Signed)
Douglas Rose. Date of Birth: 26-Aug-1945 Medical Record #[id number]  History of Present Illness: Douglas Rose is seen back today for a post hospital visit. Seen for Dr. Swaziland. He has known CAD, HTN, HLD and DM. He has had past DES to the LAD in 2006 and has a known totally occluded RCA.   Most recently presented to the hospital with chest pain. Had been noncompliant with his metoprolol. Enzymes negative. Was to have had an outpatient Lexiscan.   Comes back today. Here alone. Doing ok. BP up. Tells me that his BP is lower at home but later in the visit told me that he needed a new BP cuff. No more chest pain. Not interested in repeating his stress test - says he is "still paying for the last one". Can't tell me why he is no longer on statin. Looks like he was given Norvasc back in 2013 - not clear as to why he is not taking this either. Very poor historian.    Current Outpatient Prescriptions  Medication Sig Dispense Refill  . aspirin 81 MG tablet Take 81 mg by mouth daily.      [name] glipiZIDE (GLUCOTROL) 5 MG tablet Take 10 mg by mouth every morning.       [name] lisinopril-hydrochlorothiazide (PRINZIDE,ZESTORETIC) 20-25 MG per tablet Take 1 tablet by mouth daily.       . metFORMIN (GLUCOPHAGE) 1000 MG tablet Take 1,000 mg by mouth 2 (two) times daily with a meal.      . metoprolol succinate (TOPROL-XL) 50 MG 24 hr tablet Take 50 mg by mouth daily.       . nitroGLYCERIN (NITROSTAT) 0.4 MG SL tablet Place 1 tablet (0.4 mg total) under the tongue every 5 (five) minutes x 3 doses as needed for chest pain.  30 tablet  12  . amLODipine (NORVASC) 5 MG tablet Take 1 tablet (5 mg total) by mouth daily.  30 tablet  3  . atorvastatin (LIPITOR) 20 MG tablet Take 1 tablet (20 mg total) by mouth daily.  90 tablet  3   No current facility-administered medications for this visit.    No Known Allergies  Past Medical History  Diagnosis Date  . H/O non-insulin dependent diabetes mellitus   . Hypertension    . Hyperlipidemia   . CAD (coronary artery disease) 2006    stent LAD 3.0x18 Cypher, occluded RCA    Past Surgical History  Procedure Laterality Date  . Cardiac catheterization  2006    History  Smoking status  . Never Smoker   Smokeless tobacco  . Never Used    History  Alcohol Use No    Family History  Problem Relation Age of Onset  . Arrhythmia Mother   . Diabetes Sister   . Diabetes Brother   . Diabetes Brother     Review of Systems: The review of systems is per the HPI.  All other systems were reviewed and are negative.  Physical Exam: BP 160/90  Pulse 79  Ht 5\' 11"  (1.803 m)  Wt 173 lb 6.4 oz (78.654 kg)  BMI 24.20 kg/m2  SpO2 99% Patient is very pleasant and in no acute distress. Skin is warm and dry. Color is normal.  HEENT is unremarkable. Normocephalic/atraumatic. PERRL. Sclera are nonicteric. Neck is supple. No masses. No JVD. Lungs are clear. Cardiac exam shows a regular rate and rhythm. Abdomen is soft. Extremities are without edema. Gait and ROM are intact. No gross neurologic deficits noted.  LABORATORY  DATA:  Lab Results  Component Value Date   WBC 3.7* 11/13/2012   HGB 12.5* 11/13/2012   HCT 36.1* 11/13/2012   PLT 183 11/13/2012   GLUCOSE 175* 11/13/2012   CHOL 143 11/13/2012   TRIG 122 11/13/2012   HDL 45 11/13/2012   LDLCALC 74 11/13/2012   NA 139 11/13/2012   K 3.3* 11/13/2012   CL 102 11/13/2012   CREATININE 1.08 11/13/2012   BUN 16 11/13/2012   CO2 28 11/13/2012   INR 0.94 11/13/2012   HGBA1C 9.7* 11/13/2012   Lab Results  Component Value Date   TROPONINI <0.30 11/13/2012     Assessment / Plan: 1. Chest pain - this is resolved - he does not want to proceed on with stress testing  2. HTN - BP is 170/100 by me - I am adding back Norvasc 5 mg a day - he is not monitoring at home. BP was high for the most part during this last admission.   3. HLD - will restart statin.  See him back in a 4 weeks with fasting labs.   Patient is agreeable  to this plan and will call if any problems develop in the interim.   Rosalio Macadamia, RN, ANP-C [facility] [address] [place], [place]  [address]

## 2012-12-06 NOTE — Patient Instructions (Addendum)
See me in 1 month with fasting labs  Monitor your blood pressure at home and keep a diary  I am adding back Lipitor 20 mg a day - this is at the drug store  I am adding back Amlopidine 5 mg a day - this is at the drug store  Get a new blood pressure cuff  Call the [facility] office at [phone] if you have any questions, problems or concerns.

## 2013-01-11 ENCOUNTER — Ambulatory Visit (INDEPENDENT_AMBULATORY_CARE_PROVIDER_SITE_OTHER): Payer: BC Managed Care – PPO | Admitting: Nurse Practitioner

## 2013-01-11 ENCOUNTER — Encounter (INDEPENDENT_AMBULATORY_CARE_PROVIDER_SITE_OTHER): Payer: Self-pay

## 2013-01-11 ENCOUNTER — Encounter: Payer: Self-pay | Admitting: Nurse Practitioner

## 2013-01-11 VITALS — BP 140/82 | HR 72 | Ht 71.0 in | Wt 172.0 lb

## 2013-01-11 DIAGNOSIS — I10 Essential (primary) hypertension: Secondary | ICD-10-CM

## 2013-01-11 DIAGNOSIS — I2510 Atherosclerotic heart disease of native coronary artery without angina pectoris: Secondary | ICD-10-CM

## 2013-01-11 DIAGNOSIS — E785 Hyperlipidemia, unspecified: Secondary | ICD-10-CM

## 2013-01-11 LAB — LIPID PANEL
Cholesterol: 178 mg/dL (ref 0–200)
HDL: 48.4 mg/dL (ref >39.00)
LDL Cholesterol: 113 mg/dL — ABNORMAL HIGH (ref 0–99)
Total CHOL/HDL Ratio: 4
Triglycerides: 84 mg/dL (ref 0.0–149.0)
VLDL: 16.8 mg/dL (ref 0.0–40.0)

## 2013-01-11 LAB — BASIC METABOLIC PANEL
BUN: 27 mg/dL — ABNORMAL HIGH (ref 6–23)
CO2: 28 mEq/L (ref 19–32)
Calcium: 9.4 mg/dL (ref 8.4–10.5)
Chloride: 100 mEq/L (ref 96–112)
Creatinine, Ser: 1.4 mg/dL (ref 0.4–1.5)
GFR: 64.6 mL/min (ref >60.00)
Glucose, Bld: 214 mg/dL — ABNORMAL HIGH (ref 70–99)
Potassium: 3.8 mEq/L (ref 3.5–5.1)
Sodium: 134 mEq/L — ABNORMAL LOW (ref 135–145)

## 2013-01-11 LAB — HEPATIC FUNCTION PANEL
ALT: 22 U/L (ref 0–53)
AST: 19 U/L (ref 0–37)
Albumin: 4 g/dL (ref 3.5–5.2)
Alkaline Phosphatase: 59 U/L (ref 39–117)
Bilirubin, Direct: 0.1 mg/dL (ref 0.0–0.3)
Total Bilirubin: 0.6 mg/dL (ref 0.3–1.2)
Total Protein: 8.2 g/dL (ref 6.0–8.3)

## 2013-01-11 NOTE — Patient Instructions (Addendum)
We need to check fasting labs today  See me in 4 months  Stay on your current medicines  Let us know if you have any more chest pain/discomfort  Call the [facility] office at [phone] if you have any questions, problems or concerns.

## 2013-01-11 NOTE — Progress Notes (Signed)
Douglas Rose. Date of Birth: [birth year] Medical Record #[id number]  History of Present Illness: Mr. Witczak is seen back today for a one month follow up visit. Seen for Dr. [place]. He has known CAD, HTN, HLD and DM. He has had past DES to the LAD in 2006 and has a known totally occluded RCA.   Was in the hospital back in November with chest pain. Had been noncompliant with his metoprolol. Enzymes negative. Was to have had an outpatient Lexiscan.   Seen a month ago - was not interested in repeating his stress test - says he is "still paying for the last one". Could not tell me why he was no longer on statin. Looks like he was given Norvasc back in 2013 - not clear as to why he was not taking this either. Very poor historian. I restarted his statin and his Norvasc.   Comes back today. Here alone. Doing ok. Yesterday he noted that he was sore under his left arm - it hurt to touch - now fine. No fever or cough. No chest pain. BP has come down and is even lower at home. Notes that his only complaint is that his metformin makes him nauseated - leaves a chalky taste in his mouth. Has not touched base with his PCP to discuss. He is otherwise doing ok.   Current Outpatient Prescriptions  Medication Sig Dispense Refill  . amLODipine (NORVASC) 5 MG tablet Take 1 tablet (5 mg total) by mouth daily.  30 tablet  3  . aspirin 81 MG tablet Take 81 mg by mouth daily.      [name] atorvastatin (LIPITOR) 20 MG tablet Take 1 tablet (20 mg total) by mouth daily.  90 tablet  3  . glipiZIDE (GLUCOTROL) 5 MG tablet Take 10 mg by mouth every morning.       [name] lisinopril-hydrochlorothiazide (PRINZIDE,ZESTORETIC) 20-25 MG per tablet Take 1 tablet by mouth daily.       . metFORMIN (GLUCOPHAGE) 1000 MG tablet Take 500 mg by mouth 2 (two) times daily with a meal.       . metoprolol succinate (TOPROL-XL) 50 MG 24 hr tablet Take 50 mg by mouth daily.       . nitroGLYCERIN (NITROSTAT) 0.4 MG SL tablet Place 1 tablet (0.4 mg  total) under the tongue every 5 (five) minutes x 3 doses as needed for chest pain.  30 tablet  12   No current facility-administered medications for this visit.    No Known Allergies  Past Medical History  Diagnosis Date  . H/O non-insulin dependent diabetes mellitus   . Hypertension   . Hyperlipidemia   . CAD (coronary artery disease) 2006    stent LAD 3.0x18 [place]her, occluded RCA    Past Surgical History  Procedure Laterality Date  . Cardiac catheterization  2006    History  Smoking status  . Never Smoker   Smokeless tobacco  . Never Used    History  Alcohol Use No    Family History  Problem Relation Age of Onset  . Arrhythmia Mother   . Diabetes Sister   . Diabetes Brother   . Diabetes Brother     Review of Systems: The review of systems is per the HPI.  All other systems were reviewed and are negative.  Physical Exam: BP 140/82  Pulse 72  Ht 5\' 11"  (1.803 m)  Wt 172 lb (78.019 kg)  BMI 24.00 kg/m2 Patient is very pleasant and in no acute  distress. Skin is warm and dry. Color is normal.  HEENT is unremarkable. Normocephalic/atraumatic. PERRL. Sclera are nonicteric. Neck is supple. No masses. No JVD. Lungs are clear. Cardiac exam shows a regular rate and rhythm. Abdomen is soft. Extremities are without edema. Gait and ROM are intact. No gross neurologic deficits noted.  LABORATORY DATA: Lab Results  Component Value Date   WBC 3.7* 11/13/2012   HGB 12.5* 11/13/2012   HCT 36.1* 11/13/2012   PLT 183 11/13/2012   GLUCOSE 175* 11/13/2012   CHOL 143 11/13/2012   TRIG 122 11/13/2012   HDL 45 11/13/2012   LDLCALC 74 11/13/2012   NA 139 11/13/2012   K 3.3* 11/13/2012   CL 102 11/13/2012   CREATININE 1.08 11/13/2012   BUN 16 11/13/2012   CO2 28 11/13/2012   INR 0.94 11/13/2012   HGBA1C 9.7* 11/13/2012     Assessment / Plan: 1. Chest pain - this remains resolved - he still does not want to proceed on with stress testing - will continue with current management.   2.  HTN - back on Norvasc 5 mg a day - BP has improved and is lower at home. No change in current therapy.   3. HLD - statin restarted - needs labs today.    See him back in 4 months. Encouraged CV risk factor modification. He is planning on seeing PCP next month for discussion/treatment of his diabetes.   Patient is agreeable to this plan and will call if any problems develop in the interim.   Burtis Junes, RN, [place]  [address] [place]  [place], [place] [address]

## 2013-05-10 ENCOUNTER — Ambulatory Visit: Payer: BC Managed Care – PPO | Admitting: Nurse Practitioner

## 2014-07-19 DIAGNOSIS — D179 Benign lipomatous neoplasm, unspecified: Secondary | ICD-10-CM | POA: Insufficient documentation

## 2014-07-19 HISTORY — DX: Benign lipomatous neoplasm, unspecified: D17.9

## 2015-01-24 DIAGNOSIS — R972 Elevated prostate specific antigen [PSA]: Secondary | ICD-10-CM

## 2015-01-24 DIAGNOSIS — C61 Malignant neoplasm of prostate: Secondary | ICD-10-CM | POA: Insufficient documentation

## 2015-01-24 HISTORY — DX: Elevated prostate specific antigen (PSA): R97.20

## 2015-01-30 ENCOUNTER — Other Ambulatory Visit: Payer: Self-pay | Admitting: Cardiology

## 2015-01-30 DIAGNOSIS — R079 Chest pain, unspecified: Secondary | ICD-10-CM

## 2015-02-21 ENCOUNTER — Encounter (HOSPITAL_COMMUNITY)
Admission: RE | Admit: 2015-02-21 | Discharge: 2015-02-21 | Disposition: A | Discharge status: Home / Self Care | Payer: BLUE CROSS/BLUE SHIELD | Source: Ambulatory Visit | Attending: Cardiology | Admitting: Cardiology

## 2015-02-21 ENCOUNTER — Ambulatory Visit (HOSPITAL_COMMUNITY)
Admission: RE | Admit: 2015-02-21 | Discharge: 2015-02-21 | Disposition: A | Discharge status: Home / Self Care | Payer: BLUE CROSS/BLUE SHIELD | Source: Ambulatory Visit | Attending: Cardiology | Admitting: Cardiology

## 2015-02-21 DIAGNOSIS — R079 Chest pain, unspecified: Secondary | ICD-10-CM | POA: Insufficient documentation

## 2015-02-21 MED ORDER — TECHNETIUM TC 99M SESTAMIBI GENERIC - CARDIOLITE
30.0000 | Freq: Once | INTRAVENOUS | Status: AC | PRN
  Administered 2015-02-21: 30 via INTRAVENOUS

## 2015-02-21 MED ORDER — REGADENOSON 0.4 MG/5ML IV SOLN
INTRAVENOUS | Status: AC
  Filled 2015-02-21: qty 5

## 2015-02-21 MED ORDER — REGADENOSON 0.4 MG/5ML IV SOLN
0.4000 mg | Freq: Once | INTRAVENOUS | Status: AC
  Administered 2015-02-21: 0.4 mg via INTRAVENOUS

## 2015-02-21 MED ORDER — TECHNETIUM TC 99M SESTAMIBI GENERIC - CARDIOLITE
10.0000 | Freq: Once | INTRAVENOUS | Status: AC | PRN
  Administered 2015-02-21: 10 via INTRAVENOUS

## 2015-02-21 NOTE — Progress Notes (Signed)
5 mins, Dr. Lemmie Evens at bedside, pt denies SOB/CP, or Nausea.  Stress test ended

## 2015-02-21 NOTE — Progress Notes (Signed)
1 min, Dr. Terrence Dupont at bedside, pt reports "stomach upset." Pt denies CP/SOB

## 2015-02-21 NOTE — Progress Notes (Signed)
3 mins, Dr. Lemmie Evens at bedside. Pt denies CP/SOB, states "stomach upset is better."

## 2015-02-22 LAB — NM MYOCAR MULTI W/SPECT W/WALL MOTION / EF
CHL CUP STRESS STAGE 1 SBP: 194 mmHg
CHL CUP STRESS STAGE 3 SPEED: 0 mph
CHL CUP STRESS STAGE 4 GRADE: 0 %
CSEPEW: 1 METS
CSEPPHR: 82 {beats}/min
Peak BP: 164 mmHg
Percent of predicted max HR: 55 %
Stage 1 DBP: 96 mmHg
Stage 1 Grade: 0 %
Stage 1 HR: 65 {beats}/min
Stage 1 Speed: 0 mph
Stage 2 Grade: 0 %
Stage 2 HR: 65 {beats}/min
Stage 2 Speed: 0 mph
Stage 3 DBP: 75 mmHg
Stage 3 Grade: 0 %
Stage 3 HR: 85 {beats}/min
Stage 3 SBP: 145 mmHg
Stage 4 DBP: 87 mmHg
Stage 4 HR: 82 {beats}/min
Stage 4 SBP: 164 mmHg
Stage 4 Speed: 0 mph

## 2015-11-21 DIAGNOSIS — R591 Generalized enlarged lymph nodes: Secondary | ICD-10-CM | POA: Diagnosis present

## 2015-11-21 HISTORY — DX: Generalized enlarged lymph nodes: R59.1

## 2016-02-26 DIAGNOSIS — R198 Other specified symptoms and signs involving the digestive system and abdomen: Secondary | ICD-10-CM

## 2016-02-26 HISTORY — DX: Other specified symptoms and signs involving the digestive system and abdomen: R19.8

## 2016-03-01 ENCOUNTER — Encounter (HOSPITAL_COMMUNITY): Payer: Self-pay

## 2016-03-01 ENCOUNTER — Inpatient Hospital Stay (HOSPITAL_COMMUNITY)
Admission: EM | Admit: 2016-03-01 | Discharge: 2016-03-04 | DRG: 682 | Disposition: A | Discharge status: Home / Self Care | Payer: Medicare HMO | Attending: Oncology | Admitting: Oncology

## 2016-03-01 DIAGNOSIS — N401 Enlarged prostate with lower urinary tract symptoms: Secondary | ICD-10-CM | POA: Diagnosis present

## 2016-03-01 DIAGNOSIS — N179 Acute kidney failure, unspecified: Secondary | ICD-10-CM

## 2016-03-01 DIAGNOSIS — I2510 Atherosclerotic heart disease of native coronary artery without angina pectoris: Secondary | ICD-10-CM | POA: Diagnosis present

## 2016-03-01 DIAGNOSIS — K859 Acute pancreatitis without necrosis or infection, unspecified: Secondary | ICD-10-CM

## 2016-03-01 DIAGNOSIS — E1169 Type 2 diabetes mellitus with other specified complication: Secondary | ICD-10-CM | POA: Diagnosis present

## 2016-03-01 DIAGNOSIS — R1010 Upper abdominal pain, unspecified: Secondary | ICD-10-CM

## 2016-03-01 DIAGNOSIS — N138 Other obstructive and reflux uropathy: Secondary | ICD-10-CM | POA: Diagnosis present

## 2016-03-01 DIAGNOSIS — R338 Other retention of urine: Secondary | ICD-10-CM | POA: Diagnosis present

## 2016-03-01 DIAGNOSIS — R911 Solitary pulmonary nodule: Secondary | ICD-10-CM | POA: Diagnosis present

## 2016-03-01 DIAGNOSIS — C9000 Multiple myeloma not having achieved remission: Secondary | ICD-10-CM | POA: Diagnosis present

## 2016-03-01 DIAGNOSIS — N189 Chronic kidney disease, unspecified: Secondary | ICD-10-CM

## 2016-03-01 DIAGNOSIS — R339 Retention of urine, unspecified: Secondary | ICD-10-CM

## 2016-03-01 DIAGNOSIS — R630 Anorexia: Secondary | ICD-10-CM | POA: Diagnosis present

## 2016-03-01 DIAGNOSIS — E1122 Type 2 diabetes mellitus with diabetic chronic kidney disease: Secondary | ICD-10-CM | POA: Diagnosis present

## 2016-03-01 DIAGNOSIS — R319 Hematuria, unspecified: Secondary | ICD-10-CM | POA: Diagnosis present

## 2016-03-01 DIAGNOSIS — N289 Disorder of kidney and ureter, unspecified: Secondary | ICD-10-CM | POA: Diagnosis present

## 2016-03-01 DIAGNOSIS — R198 Other specified symptoms and signs involving the digestive system and abdomen: Secondary | ICD-10-CM | POA: Diagnosis present

## 2016-03-01 DIAGNOSIS — N400 Enlarged prostate without lower urinary tract symptoms: Secondary | ICD-10-CM

## 2016-03-01 DIAGNOSIS — Z79899 Other long term (current) drug therapy: Secondary | ICD-10-CM

## 2016-03-01 DIAGNOSIS — D649 Anemia, unspecified: Secondary | ICD-10-CM | POA: Diagnosis present

## 2016-03-01 DIAGNOSIS — R109 Unspecified abdominal pain: Secondary | ICD-10-CM | POA: Diagnosis not present

## 2016-03-01 DIAGNOSIS — I10 Essential (primary) hypertension: Secondary | ICD-10-CM | POA: Diagnosis present

## 2016-03-01 DIAGNOSIS — Z8639 Personal history of other endocrine, nutritional and metabolic disease: Secondary | ICD-10-CM

## 2016-03-01 DIAGNOSIS — K5900 Constipation, unspecified: Secondary | ICD-10-CM | POA: Diagnosis present

## 2016-03-01 DIAGNOSIS — K319 Disease of stomach and duodenum, unspecified: Secondary | ICD-10-CM | POA: Diagnosis present

## 2016-03-01 DIAGNOSIS — R591 Generalized enlarged lymph nodes: Secondary | ICD-10-CM | POA: Diagnosis present

## 2016-03-01 DIAGNOSIS — Z955 Presence of coronary angioplasty implant and graft: Secondary | ICD-10-CM

## 2016-03-01 DIAGNOSIS — E43 Unspecified severe protein-calorie malnutrition: Secondary | ICD-10-CM | POA: Insufficient documentation

## 2016-03-01 DIAGNOSIS — D472 Monoclonal gammopathy: Secondary | ICD-10-CM

## 2016-03-01 DIAGNOSIS — Z7984 Long term (current) use of oral hypoglycemic drugs: Secondary | ICD-10-CM

## 2016-03-01 DIAGNOSIS — Z7982 Long term (current) use of aspirin: Secondary | ICD-10-CM

## 2016-03-01 DIAGNOSIS — E785 Hyperlipidemia, unspecified: Secondary | ICD-10-CM | POA: Diagnosis present

## 2016-03-01 HISTORY — DX: Acute kidney failure, unspecified: N17.9

## 2016-03-01 HISTORY — DX: Chronic kidney disease, unspecified: N18.9

## 2016-03-01 LAB — CBC WITH DIFFERENTIAL/PLATELET
Basophils Absolute: 0 10*3/uL (ref 0.0–0.1)
Basophils Relative: 0 %
EOS PCT: 1 %
Eosinophils Absolute: 0.1 10*3/uL (ref 0.0–0.7)
HCT: 32.6 % — ABNORMAL LOW (ref 39.0–52.0)
HEMOGLOBIN: 10.8 g/dL — AB (ref 13.0–17.0)
LYMPHS ABS: 1.7 10*3/uL (ref 0.7–4.0)
Lymphocytes Relative: 34 %
MCH: 27.9 pg (ref 26.0–34.0)
MCHC: 33.1 g/dL (ref 30.0–36.0)
MCV: 84.2 fL (ref 78.0–100.0)
Monocytes Absolute: 0.7 10*3/uL (ref 0.1–1.0)
Monocytes Relative: 13 %
NEUTROS ABS: 2.6 10*3/uL (ref 1.7–7.7)
Neutrophils Relative %: 52 %
PLATELETS: 252 10*3/uL (ref 150–400)
RBC: 3.87 MIL/uL — ABNORMAL LOW (ref 4.22–5.81)
RDW: 14.1 % (ref 11.5–15.5)
WBC: 5.1 10*3/uL (ref 4.0–10.5)

## 2016-03-01 LAB — COMPREHENSIVE METABOLIC PANEL
ALT: 9 U/L — AB (ref 17–63)
AST: 14 U/L — ABNORMAL LOW (ref 15–41)
Albumin: 3.4 g/dL — ABNORMAL LOW (ref 3.5–5.0)
Alkaline Phosphatase: 49 U/L (ref 38–126)
Anion gap: 11 (ref 5–15)
BUN: 39 mg/dL — ABNORMAL HIGH (ref 6–20)
CHLORIDE: 100 mmol/L — AB (ref 101–111)
CO2: 25 mmol/L (ref 22–32)
Calcium: 9.2 mg/dL (ref 8.9–10.3)
Creatinine, Ser: 5.06 mg/dL — ABNORMAL HIGH (ref 0.61–1.24)
GFR calc non Af Amer: 10 mL/min — ABNORMAL LOW (ref >60)
GFR, EST AFRICAN AMERICAN: 12 mL/min — AB (ref >60)
Glucose, Bld: 172 mg/dL — ABNORMAL HIGH (ref 65–99)
POTASSIUM: 3.7 mmol/L (ref 3.5–5.1)
SODIUM: 136 mmol/L (ref 135–145)
Total Bilirubin: 0.3 mg/dL (ref 0.3–1.2)
Total Protein: 7.9 g/dL (ref 6.5–8.1)

## 2016-03-01 LAB — URINALYSIS, ROUTINE W REFLEX MICROSCOPIC
BACTERIA UA: NONE SEEN
BILIRUBIN URINE: NEGATIVE
Glucose, UA: NEGATIVE mg/dL
KETONES UR: NEGATIVE mg/dL
LEUKOCYTES UA: NEGATIVE
NITRITE: NEGATIVE
Protein, ur: NEGATIVE mg/dL
Specific Gravity, Urine: 1.004 — ABNORMAL LOW (ref 1.005–1.030)
Squamous Epithelial / LPF: NONE SEEN
pH: 6 (ref 5.0–8.0)

## 2016-03-01 LAB — LIPASE, BLOOD: Lipase: 102 U/L — ABNORMAL HIGH (ref 11–51)

## 2016-03-01 MED ORDER — IOPAMIDOL (ISOVUE-300) INJECTION 61%
INTRAVENOUS | Status: AC
  Filled 2016-03-01: qty 30

## 2016-03-01 MED ORDER — SODIUM CHLORIDE 0.9 % IV BOLUS (SEPSIS)
500.0000 mL | Freq: Once | INTRAVENOUS | Status: AC
  Administered 2016-03-01: 500 mL via INTRAVENOUS

## 2016-03-01 NOTE — H&P (Signed)
Date: 03/02/2016               Patient Name:  Douglas Rose. MRN: [id number]  DOB: 03-03-1945 Age / Sex: 71 y.o., male   PCP: Vickii Penna, MD         Medical Service: Internal Medicine Teaching Service         Attending Physician: Dr. Annia Belt, MD    First Contact: Dr. Philipp Ovens  Pager: [phone]  Second Contact: Dr. Charlynn Grimes  Pager: [phone]       After Hours (After 5p/  First Contact Pager: [phone]  weekends / holidays): Second Contact Pager: [phone]   Chief Complaint: abdominal bloating   History of Present Illness: [name]. is a 71 y.o. male with a PMH of non insulin dependent type 2 diabetes and hypertension who presents with abdominal bloating and pain for the past 3 and a half weeks. This pain is just over his umbilicus it is relieved with urination and worse first thing in the morning when he wakes up and has to urinate. It does not change with eating or defecation and does not radiate. He has not tried any pain medications to relieve the pain. Initially the pain was associated with constipation and back pain but those symptoms have resolved. He has noticed a change in the frequency and consistency of his bowel movements. He has "small, weak" bowel movements every 2-3 days and a feeling that he has not completely defecated. The bowel movements are soft without blood or mucous or change in color. He has also had a decrease in his appetite which is not related to feeling the abdominal pain. He believes that he has lost weight based on his home scale ready compared to office visit 2/20 where he weighed 163 lbs but today he weighs 172 lbs. He has not had any change in his urination. He has been drinking about 5x 4-oz waters per day. He denies fever, nausea, vomiting, or swelling. Prior colonoscopy was 12 years ago and revealed a polyp, he was told to follow up with colonoscopy in 10 years, the results of this colonoscopy are not available to me in his EMR.     He was seen in urgent care earlier this month and prescribed reglan, he took this 3x per day not in correlation with his eating schedule. The reglan did not help to relieve his symptoms.   Meds:  Current Meds  Medication Sig  . amLODipine (NORVASC) 10 MG tablet Take 10 mg by mouth daily.  [name] aspirin 81 MG tablet Take 81 mg by mouth daily.  [name] atorvastatin (LIPITOR) 20 MG tablet Take 1 tablet (20 mg total) by mouth daily.  [name] glipiZIDE (GLUCOTROL) 5 MG tablet Take 10 mg by mouth every morning.   [name] JANUMET XR [phone] MG TB24 Take 1 tablet by mouth daily.  [name] lisinopril-hydrochlorothiazide (PRINZIDE,ZESTORETIC) 20-25 MG per tablet Take 1 tablet by mouth daily.   . metFORMIN (GLUCOPHAGE) 1000 MG tablet Take 500 mg by mouth 2 (two) times daily with a meal.   . metoprolol succinate (TOPROL-XL) 50 MG 24 hr tablet Take 50 mg by mouth daily.   . nitroGLYCERIN (NITROSTAT) 0.4 MG SL tablet Place 1 tablet (0.4 mg total) under the tongue every 5 (five) minutes x 3 doses as needed for chest pain.     Allergies: Allergies as of 03/01/2016  . (No Known Allergies)   Past Medical History:  Diagnosis Date  . CAD (  coronary artery disease) 2006   stent LAD 3.0x18 Cypher, occluded RCA  . H/O non-insulin dependent diabetes mellitus   . Hyperlipidemia   . Hypertension    Family History:  Family History  Problem Relation Age of Onset  . Arrhythmia Mother   . Diabetes Sister   . Diabetes Brother   . Diabetes Brother    Social History: Social History   Social History  . Marital status: Married    Spouse name: N/A  . Number of children: 6  . Years of education: N/A   Occupational History  . retired Company secretary    Social History Main Topics  . Smoking status: Never Smoker  . Smokeless tobacco: Never Used  . Alcohol use No  . Drug use: No  . Sexual activity: Not Currently   Other Topics Concern  . Not on file   Social History Narrative  . No narrative on file   Review of Systems: A  complete ROS was negative except as per HPI.   Physical Exam: Blood pressure 165/79, pulse 82, temperature 98.6 F (37 C), temperature source Oral, resp. rate 18, SpO2 100 %. Physical Exam  Constitutional: He appears well-developed and well-nourished. No distress.  HENT:  Head: Normocephalic and atraumatic.  Eyes: Conjunctivae are normal. No scleral icterus.  Neck:  Right posterior auricular lymph node  Cardiovascular: Normal rate and regular rhythm.   No murmur heard. Pulmonary/Chest: Effort normal. No respiratory distress. He has no wheezes. He has no rales.  Abdominal: Soft. He exhibits no distension and no mass.  Tenderness and guarding with palpation over the umbilical area  Neurological: He is alert.  Skin: Skin is warm and dry. He is not diaphoretic.  Psychiatric: He has a normal mood and affect. His behavior is normal.   CT abdomen and pelvis: Personal review of the image reveals inflammatory changes in within the pancreas, thickened bladder wall, enlarged prostate gland and 2 mm pulmonary nodule.   LABS  Na 136, K 3.7, CO2 25, BUN 39, Crt 5.06, GFR 12, Glucose 172 Alk phos 49, Albumin 3.4, Lipase 102, AST 14, ALT 9 WBC 5.1, Hgb 10.8 (b/l 12.3), MCV 84, RDW 14, Plt 252  Urinalysis - small hgb dipstick, 0-5 RBC, negative protein spec grav 1.004, no squam epi, WBC 0-5   Assessment & Plan by Problem: Principal Problem:   Acute renal failure (ARF) (HCC) Active Problems:   Coronary artery disease   Hyperlipidemia   Hypertension   H/O non-insulin dependent diabetes mellitus   Abdominal fullness   Lymphadenopathy  71 y.o. male with a PMH of type 2 diabetes and hypertension who presents with abdominal pain and changes in bowel movements for the past month.  In the ED vitals were temp 98.2, HR 85, BP 160/74, and SpO2 100% on room air. On exam he was found to have supraumbilical tenderness. He was found to have an elevated lipase and abnormal renal function on labs.Was given  500cc NaCl bolus and admitted for acute kidney injury.   Acute renal failure  Crt 5.06 on admission up from 1.16 12/2015. At office visit 2/20 he had CRP 9.93 and ESR 72. Hep C antibody was negative 12/2015. His decreased po intake would correlate with prerenal AKI however he has hemodynamically stable vitals and FeNa >3 suggest that this is not the cause. In the ED he was found to have urinary retention and a foley catheter was placed so this kidney dysfunction may be related to post renal obstruction. He had  a normal PSA of 3.7 01/2015 but was found to have prostate enlargement on CT abdomen and pelvis. Imaging also showed thickened bladder wall which could be related to chronic obstruction.  - NS 124 cc/hr - f/u HIV  -f/u renal ultrasound  -f/u AM BMP   Pancreatitis  On admission lipase 102 with fat stranding on CT abdomen and pelvis. He denies alcohol use.  - NPO   Prostate enlargement  Found on CT abdomen and pelvis. He had a normal PSA of 3.7 01/2015. Was found to have post void residual on bladder scanning in the ED. He may have untreated BPH.  - f/u PSA   Lymphadenopathy PCP is following right submandibular lymph node. He has had CBCs showing mild anemia but normal peripheral smear. They have referred him to ENT.   Type 2 Diabetes  Last HbA1c 02/2016 8.1. Home medications include glipizide 5 mg daily and metformin 500 mg BID.  - ordered ISS- sensitive w HS coverage   HTN  Home medications include Amlodipine 5 mg daily, and lisinopril- HCTZ 20-25 mg daily.   Coronary artery disease  - continue home meds ASA, atorvastatin 20 mg daily, and metoprolol-xl 50 mg daily   Pulmonary nodule  CT abdomen and pelvis incidentally revealed 2 mm left anterior lung base nodule. Pt states he is a non smoker and has no family hx of lung cancer. Will not need follow up imaging scheduled at this time.   N NPO  DVT Ppx heparin Code Status FULL   Dispo: Admit patient to Inpatient with expected  length of stay greater than 2 midnights.  Signed: Ledell Noss, MD 03/02/2016, 12:51 AM  Pager: [phone]

## 2016-03-01 NOTE — ED Triage Notes (Signed)
Pt complaining of diarrhea x 4 weeks. Pt states seen by UC last week for same. Pt states no appetite since. Pt states scheduled for colonoscopy. Pt complaining of bloating in stomach. Pt states dry mouth at triage.

## 2016-03-01 NOTE — ED Provider Notes (Addendum)
[facility] DEPT Provider Note   CSN: [id number] Arrival date & time: 03/01/16  1718     History   Chief Complaint Chief Complaint  Patient presents with  . Bloated  . Anorexia    HPI Jermayne Galin. is a 71 y.o. male.  HPI Patient presents with abdominal pain and bloating. Has had it for the last month. Had initially had some diarrhea now is having more constipation. Has only around 3 bowel movements a week which is not as much form. States that he feels if he had a good bowel movement it would feel better. His been seen by his primary care doctor couple 4. Has had x-rays and some blood work along with treatment that has not really helped. Is scheduled to see a test for neurologist for colonoscopy and upper endoscopy. No chest pain. No trouble breathing. He has had a decreased appetite due to the pain. No blood in the stool. States he has lost around 7 or 8 pounds. He's had a decreased appetite.   Past Medical History:  Diagnosis Date  . CAD (coronary artery disease) 2006   stent LAD 3.0x18 Cypher, occluded RCA  . H/O non-insulin dependent diabetes mellitus   . Hyperlipidemia   . Hypertension     Patient Active Problem List   Diagnosis Date Noted  . CAD (coronary artery disease)   . Hyperlipidemia   . Hypertension   . H/O non-insulin dependent diabetes mellitus     Past Surgical History:  Procedure Laterality Date  . CARDIAC CATHETERIZATION  2006       Home Medications    Prior to Admission medications   Medication Sig Start Date End Date Taking? Authorizing Provider  amLODipine (NORVASC) 5 MG tablet Take 1 tablet (5 mg total) by mouth daily. 12/06/12   Burtis Junes, NP  aspirin 81 MG tablet Take 81 mg by mouth daily.    Historical Provider, MD  atorvastatin (LIPITOR) 20 MG tablet Take 1 tablet (20 mg total) by mouth daily. 12/06/12   Burtis Junes, NP  glipiZIDE (GLUCOTROL) 5 MG tablet Take 10 mg by mouth every morning.  10/25/12   Historical  Provider, MD  lisinopril-hydrochlorothiazide (PRINZIDE,ZESTORETIC) 20-25 MG per tablet Take 1 tablet by mouth daily.  07/21/11   Historical Provider, MD  metFORMIN (GLUCOPHAGE) 1000 MG tablet Take 500 mg by mouth 2 (two) times daily with a meal.     Historical Provider, MD  metoprolol succinate (TOPROL-XL) 50 MG 24 hr tablet Take 50 mg by mouth daily.  07/21/11   Historical Provider, MD  nitroGLYCERIN (NITROSTAT) 0.4 MG SL tablet Place 1 tablet (0.4 mg total) under the tongue every 5 (five) minutes x 3 doses as needed for chest pain. 11/13/12   Andrez Grime, PA-C    Family History Family History  Problem Relation Age of Onset  . Arrhythmia Mother   . Diabetes Sister   . Diabetes Brother   . Diabetes Brother     Social History Social History  Substance Use Topics  . Smoking status: Never Smoker  . Smokeless tobacco: Never Used  . Alcohol use No     Allergies   Patient has no known allergies.   Review of Systems Review of Systems  Constitutional: Positive for appetite change, fatigue and unexpected weight change. Negative for fever.  HENT: Negative for congestion.   Eyes: Negative for redness.  Respiratory: Negative for shortness of breath.   Cardiovascular: Negative for chest pain.  Gastrointestinal: Positive for  abdominal pain, constipation, diarrhea and nausea.  Endocrine: Negative for polyuria.  Genitourinary: Negative for flank pain.  Musculoskeletal: Negative for back pain.  Neurological: Negative for seizures and headaches.  Hematological: Negative for adenopathy.  Psychiatric/Behavioral: Negative for confusion.     Physical Exam Updated Vital Signs BP 165/79   Pulse 82   Temp 98.6 F (37 C) (Oral)   Resp 18   SpO2 100%   Physical Exam  Constitutional: He appears well-developed.  HENT:  Head: Atraumatic.  Neck: Neck supple.  Cardiovascular: Normal rate.   Pulmonary/Chest: Effort normal.  Abdominal: There is tenderness. There is no guarding.  Mild  upper abdominal tenderness without rebound or guarding. No masses.  Musculoskeletal: He exhibits no edema.  Neurological: He is alert.  Skin: Skin is warm. Capillary refill takes less than 2 seconds.     ED Treatments / Results  Labs (all labs ordered are listed, but only abnormal results are displayed) Labs Reviewed  COMPREHENSIVE METABOLIC PANEL - Abnormal; Notable for the following:       Result Value   Chloride 100 (*)    Glucose, Bld 172 (*)    BUN 39 (*)    Creatinine, Ser 5.06 (*)    Albumin 3.4 (*)    AST 14 (*)    ALT 9 (*)    GFR calc non Af Amer 10 (*)    GFR calc Af Amer 12 (*)    All other components within normal limits  LIPASE, BLOOD - Abnormal; Notable for the following:    Lipase 102 (*)    All other components within normal limits  CBC WITH DIFFERENTIAL/PLATELET - Abnormal; Notable for the following:    RBC 3.87 (*)    Hemoglobin 10.8 (*)    HCT 32.6 (*)    All other components within normal limits  URINALYSIS, ROUTINE W REFLEX MICROSCOPIC - Abnormal; Notable for the following:    Color, Urine COLORLESS (*)    Specific Gravity, Urine 1.004 (*)    Hgb urine dipstick SMALL (*)    All other components within normal limits  URINE CULTURE  SODIUM, URINE, RANDOM  CREATININE, URINE, RANDOM    EKG  EKG Interpretation None       Radiology No results found.  Procedures Procedures (including critical care time)  Medications Ordered in ED Medications  iopamidol (ISOVUE-300) 61 % injection (not administered)  sodium chloride 0.9 % bolus 500 mL (0 mLs Intravenous Stopped 03/01/16 2218)     Initial Impression / Assessment and Plan / ED Course  I have reviewed the triage vital signs and the nursing notes.  Pertinent labs & imaging results that were available during my care of the patient were reviewed by me and considered in my medical decision making (see chart for details).     Patient with abdominal pain. Decreased oral intake. Found to have new  renal failure. Creatinine of 5. It was normal 2 weeks ago. Did have some urinary retention after he voided. Foley catheter placed. Lipase is mildly elevated. Will admit to internal medicine.  Final Clinical Impressions(s) / ED Diagnoses   Final diagnoses:  Acute renal failure, unspecified acute renal failure type ([place])  Upper abdominal pain  Urinary retention    New Prescriptions New Prescriptions   No medications on file     Davonna Belling, MD 03/01/16 [place], MD 03/01/16 [phone]

## 2016-03-02 ENCOUNTER — Encounter (HOSPITAL_COMMUNITY): Payer: Self-pay | Admitting: *Deleted

## 2016-03-02 ENCOUNTER — Emergency Department (HOSPITAL_COMMUNITY): Payer: Medicare HMO

## 2016-03-02 ENCOUNTER — Inpatient Hospital Stay (HOSPITAL_COMMUNITY): Payer: Medicare HMO

## 2016-03-02 DIAGNOSIS — Z79899 Other long term (current) drug therapy: Secondary | ICD-10-CM | POA: Diagnosis not present

## 2016-03-02 DIAGNOSIS — R590 Localized enlarged lymph nodes: Secondary | ICD-10-CM | POA: Diagnosis not present

## 2016-03-02 DIAGNOSIS — Z955 Presence of coronary angioplasty implant and graft: Secondary | ICD-10-CM | POA: Diagnosis not present

## 2016-03-02 DIAGNOSIS — N401 Enlarged prostate with lower urinary tract symptoms: Secondary | ICD-10-CM

## 2016-03-02 DIAGNOSIS — R911 Solitary pulmonary nodule: Secondary | ICD-10-CM

## 2016-03-02 DIAGNOSIS — R339 Retention of urine, unspecified: Secondary | ICD-10-CM

## 2016-03-02 DIAGNOSIS — E119 Type 2 diabetes mellitus without complications: Secondary | ICD-10-CM

## 2016-03-02 DIAGNOSIS — Z8249 Family history of ischemic heart disease and other diseases of the circulatory system: Secondary | ICD-10-CM

## 2016-03-02 DIAGNOSIS — Z960 Presence of urogenital implants: Secondary | ICD-10-CM

## 2016-03-02 DIAGNOSIS — R748 Abnormal levels of other serum enzymes: Secondary | ICD-10-CM | POA: Diagnosis not present

## 2016-03-02 DIAGNOSIS — K859 Acute pancreatitis without necrosis or infection, unspecified: Secondary | ICD-10-CM

## 2016-03-02 DIAGNOSIS — I10 Essential (primary) hypertension: Secondary | ICD-10-CM

## 2016-03-02 DIAGNOSIS — N400 Enlarged prostate without lower urinary tract symptoms: Secondary | ICD-10-CM

## 2016-03-02 DIAGNOSIS — I2510 Atherosclerotic heart disease of native coronary artery without angina pectoris: Secondary | ICD-10-CM | POA: Diagnosis present

## 2016-03-02 DIAGNOSIS — K319 Disease of stomach and duodenum, unspecified: Secondary | ICD-10-CM | POA: Diagnosis present

## 2016-03-02 DIAGNOSIS — R630 Anorexia: Secondary | ICD-10-CM | POA: Diagnosis present

## 2016-03-02 DIAGNOSIS — E785 Hyperlipidemia, unspecified: Secondary | ICD-10-CM | POA: Diagnosis present

## 2016-03-02 DIAGNOSIS — N289 Disorder of kidney and ureter, unspecified: Secondary | ICD-10-CM | POA: Diagnosis present

## 2016-03-02 DIAGNOSIS — N179 Acute kidney failure, unspecified: Principal | ICD-10-CM

## 2016-03-02 DIAGNOSIS — N138 Other obstructive and reflux uropathy: Secondary | ICD-10-CM | POA: Diagnosis present

## 2016-03-02 DIAGNOSIS — Z7982 Long term (current) use of aspirin: Secondary | ICD-10-CM

## 2016-03-02 DIAGNOSIS — Z833 Family history of diabetes mellitus: Secondary | ICD-10-CM

## 2016-03-02 DIAGNOSIS — R338 Other retention of urine: Secondary | ICD-10-CM | POA: Diagnosis present

## 2016-03-02 DIAGNOSIS — E1169 Type 2 diabetes mellitus with other specified complication: Secondary | ICD-10-CM | POA: Diagnosis present

## 2016-03-02 DIAGNOSIS — Z7984 Long term (current) use of oral hypoglycemic drugs: Secondary | ICD-10-CM | POA: Diagnosis not present

## 2016-03-02 DIAGNOSIS — R319 Hematuria, unspecified: Secondary | ICD-10-CM | POA: Diagnosis present

## 2016-03-02 DIAGNOSIS — E1122 Type 2 diabetes mellitus with diabetic chronic kidney disease: Secondary | ICD-10-CM | POA: Diagnosis present

## 2016-03-02 DIAGNOSIS — K5900 Constipation, unspecified: Secondary | ICD-10-CM | POA: Diagnosis present

## 2016-03-02 DIAGNOSIS — R109 Unspecified abdominal pain: Secondary | ICD-10-CM | POA: Diagnosis present

## 2016-03-02 DIAGNOSIS — D649 Anemia, unspecified: Secondary | ICD-10-CM | POA: Diagnosis present

## 2016-03-02 DIAGNOSIS — C9000 Multiple myeloma not having achieved remission: Secondary | ICD-10-CM | POA: Diagnosis present

## 2016-03-02 DIAGNOSIS — R1010 Upper abdominal pain, unspecified: Secondary | ICD-10-CM

## 2016-03-02 HISTORY — DX: Benign prostatic hyperplasia without lower urinary tract symptoms: N40.0

## 2016-03-02 HISTORY — DX: Acute pancreatitis without necrosis or infection, unspecified: K85.90

## 2016-03-02 LAB — GLUCOSE, CAPILLARY
GLUCOSE-CAPILLARY: 136 mg/dL — AB (ref 65–99)
GLUCOSE-CAPILLARY: 109 mg/dL — AB (ref 65–99)
GLUCOSE-CAPILLARY: 134 mg/dL — AB (ref 65–99)
Glucose-Capillary: 88 mg/dL (ref 65–99)
Glucose-Capillary: 82 mg/dL (ref 65–99)

## 2016-03-02 LAB — CREATININE, URINE, RANDOM: Creatinine, Urine: 55.42 mg/dL

## 2016-03-02 LAB — CBC
HEMATOCRIT: 31.2 % — AB (ref 39.0–52.0)
Hemoglobin: 10.3 g/dL — ABNORMAL LOW (ref 13.0–17.0)
MCH: 27.6 pg (ref 26.0–34.0)
MCHC: 33 g/dL (ref 30.0–36.0)
MCV: 83.6 fL (ref 78.0–100.0)
PLATELETS: 242 10*3/uL (ref 150–400)
RBC: 3.73 MIL/uL — AB (ref 4.22–5.81)
RDW: 14 % (ref 11.5–15.5)
WBC: 5.4 10*3/uL (ref 4.0–10.5)

## 2016-03-02 LAB — BASIC METABOLIC PANEL
Anion gap: 8 (ref 5–15)
BUN: 33 mg/dL — AB (ref 6–20)
CHLORIDE: 106 mmol/L (ref 101–111)
CO2: 22 mmol/L (ref 22–32)
CREATININE: 4.37 mg/dL — AB (ref 0.61–1.24)
Calcium: 8.7 mg/dL — ABNORMAL LOW (ref 8.9–10.3)
GFR calc Af Amer: 14 mL/min — ABNORMAL LOW (ref >60)
GFR calc non Af Amer: 12 mL/min — ABNORMAL LOW (ref >60)
Glucose, Bld: 141 mg/dL — ABNORMAL HIGH (ref 65–99)
POTASSIUM: 3.9 mmol/L (ref 3.5–5.1)
Sodium: 136 mmol/L (ref 135–145)

## 2016-03-02 LAB — SODIUM, URINE, RANDOM: Sodium, Ur: 44 mmol/L

## 2016-03-02 LAB — PSA: PSA: 5.26 ng/mL — ABNORMAL HIGH (ref 0.00–4.00)

## 2016-03-02 MED ORDER — INSULIN ASPART 100 UNIT/ML ~~LOC~~ SOLN: 0.0000 [IU] | Freq: Every day | SUBCUTANEOUS | Status: DC

## 2016-03-02 MED ORDER — SODIUM CHLORIDE 0.9 % IV SOLN
INTRAVENOUS | Status: AC
  Administered 2016-03-02 – 2016-03-03 (×4): via INTRAVENOUS

## 2016-03-02 MED ORDER — POLYETHYLENE GLYCOL 3350 17 G PO PACK
17.0000 g | PACK | Freq: Every day | ORAL | Status: DC
  Administered 2016-03-02: 17 g via ORAL
  Filled 2016-03-02: qty 1

## 2016-03-02 MED ORDER — HEPARIN SODIUM (PORCINE) 5000 UNIT/ML IJ SOLN
5000.0000 [IU] | Freq: Three times a day (TID) | INTRAMUSCULAR | Status: DC
  Administered 2016-03-02 – 2016-03-03 (×6): 5000 [IU] via SUBCUTANEOUS
  Filled 2016-03-02 (×6): qty 1

## 2016-03-02 MED ORDER — METOPROLOL SUCCINATE ER 50 MG PO TB24
50.0000 mg | ORAL_TABLET | Freq: Every day | ORAL | Status: DC
  Administered 2016-03-02 – 2016-03-03 (×2): 50 mg via ORAL
  Filled 2016-03-02 (×2): qty 1

## 2016-03-02 MED ORDER — INSULIN ASPART 100 UNIT/ML ~~LOC~~ SOLN
0.0000 [IU] | Freq: Three times a day (TID) | SUBCUTANEOUS | Status: DC
  Administered 2016-03-02 – 2016-03-03 (×2): 1 [IU] via SUBCUTANEOUS
  Administered 2016-03-03: 2 [IU] via SUBCUTANEOUS

## 2016-03-02 MED ORDER — ACETAMINOPHEN 325 MG PO TABS
650.0000 mg | ORAL_TABLET | Freq: Four times a day (QID) | ORAL | Status: DC | PRN
  Administered 2016-03-04: 650 mg via ORAL
  Filled 2016-03-02: qty 2

## 2016-03-02 MED ORDER — ATORVASTATIN CALCIUM 20 MG PO TABS
20.0000 mg | ORAL_TABLET | Freq: Every day | ORAL | Status: DC
  Administered 2016-03-02 – 2016-03-03 (×2): 20 mg via ORAL
  Filled 2016-03-02 (×2): qty 1

## 2016-03-02 MED ORDER — ACETAMINOPHEN 650 MG RE SUPP: 650.0000 mg | Freq: Four times a day (QID) | RECTAL | Status: DC | PRN

## 2016-03-02 MED ORDER — ASPIRIN 81 MG PO CHEW
81.0000 mg | CHEWABLE_TABLET | Freq: Every day | ORAL | Status: DC
  Administered 2016-03-02 – 2016-03-04 (×3): 81 mg via ORAL
  Filled 2016-03-02 (×3): qty 1

## 2016-03-02 MED ORDER — AMLODIPINE BESYLATE 10 MG PO TABS
10.0000 mg | ORAL_TABLET | Freq: Every day | ORAL | Status: DC
  Administered 2016-03-02 – 2016-03-04 (×3): 10 mg via ORAL
  Filled 2016-03-02 (×3): qty 1

## 2016-03-02 NOTE — Progress Notes (Signed)
Subjective:  Douglas Rose reports feeling much improved this morning. Reports his abdominal pain is doing much better since the foley was placed. Denies any nausea or vomiting this morning.   Objective:  Vital signs in last 24 hours: Vitals:   03/02/16 0049 03/02/16 0148 03/02/16 0548 03/02/16 1032  BP: 175/96 (!) 188/85 (!) 152/82 140/76  Pulse: 82 85 86 83  Resp: 16 17 16 14   Temp:  98.5 F (36.9 C) 98.6 F (37 C) 97.6 F (36.4 C)  TempSrc:  Oral Oral Oral  SpO2: 100% 100% 100% 99%  Weight:  163 lb 4.8 oz (74.1 kg)    Height:  5\' 11"  (1.803 m)     Physical Exam  Constitutional: He is well-developed, well-nourished, and in no distress.  Cardiovascular: Normal rate and regular rhythm.   Pulmonary/Chest: Effort normal and breath sounds normal.  Abdominal: Soft. Bowel sounds are normal. He exhibits no distension. There is no tenderness. There is no rebound.  Genitourinary:  Genitourinary Comments: Foley in place; blood tinged urine in foley bag  Skin: Skin is warm and dry.  Vitals reviewed.  Assessment/Plan:  Acute renal failure  Cr 5.06 > 4.37 today following decompression with foley. 2.1 L documented output this morning. FeNa does not suggest a pre-renal process. CT abdomen int he ED only notable for enlarged prostate and thickened urinary bladder walls. No hydronephrosis or stones. Renal US this morning does not show any masses of hydronephrosis. Decompressed bladder with foley in place. He denies any symptoms of starting and stopping, weak stream/dribbling, incomplete voiding, straining. Does report frequent nocturnal urination. PSA was 3.7 01/2015. Mildly elevated at 5.26 this morning but difficult to interpret in light of foley insertion with some trauma to the prostate. UA largely unremarkable. Suspect his renal failure is secondary to outlet obstruction from BPH. Will continue foley and plan for discharge with foley in place and follow up with urology if kidney function  continues to improve.  -BMET in am -Strict I/O  Pancreatitis  On admission lipase 102 with fat stranding on CT abdomen and pelvis. He denies alcohol use or history of gallstones. No history of hypertriglyceridemia. Last lipid panel from 12/2015 shows TG of 68. Denies any nausea or vomiting today.  -Advance to clears; advance as tolerated afterwards -Continue IVF until tolerating PO   Prostate enlargement  Found on CT abdomen and pelvis. He had a normal PSA of 3.7 01/2015. PSA mildly elevated here at 5.26 but difficult to interpret given prostate manipulation with foley insertion. Will likely need to start Flomax at discharge with foley in place and follow up with urology.   Lymphadenopathy PCP is following right submandibular lymph node. He has had CBCs showing mild anemia but normal peripheral smear. They have referred him to ENT.   Type 2 Diabetes  Last HbA1c 02/2016 8.1. Home medications include glipizide 5 mg daily and metformin 500 mg BID.  CBGs well controlled here, 109-141. -Continue SSI-s  HTN  Home medications include Amlodipine 5 mg daily, and lisinopril- HCTZ 20-25 mg daily, metoprolol 50 mg BID.  BP at goal on home amlodipine and metoprolol; continue. Holding Lisinopril-HCTZ given AKI.   Coronary artery disease  - continue home meds ASA, atorvastatin 20 mg daily, and metoprolol-xl 50 mg daily   Pulmonary nodule  CT abdomen and pelvis incidentally revealed 2 mm left anterior lung base nodule. Pt states he is a non smoker and has no family hx of lung cancer. Will not need follow up imaging  scheduled at this time.   Dispo: Anticipated discharge in approximately 1 day(s).   Douglas Pile, MD 03/02/2016, 3:40 PM Pager: [phone]

## 2016-03-03 DIAGNOSIS — K5900 Constipation, unspecified: Secondary | ICD-10-CM

## 2016-03-03 DIAGNOSIS — E43 Unspecified severe protein-calorie malnutrition: Secondary | ICD-10-CM

## 2016-03-03 HISTORY — DX: Unspecified severe protein-calorie malnutrition: E43

## 2016-03-03 LAB — URINE CULTURE: Culture: NO GROWTH

## 2016-03-03 LAB — BASIC METABOLIC PANEL
Anion gap: 8 (ref 5–15)
BUN: 29 mg/dL — AB (ref 6–20)
CALCIUM: 8.5 mg/dL — AB (ref 8.9–10.3)
CO2: 22 mmol/L (ref 22–32)
CREATININE: 4.14 mg/dL — AB (ref 0.61–1.24)
Chloride: 108 mmol/L (ref 101–111)
GFR calc Af Amer: 15 mL/min — ABNORMAL LOW (ref >60)
GFR, EST NON AFRICAN AMERICAN: 13 mL/min — AB (ref >60)
GLUCOSE: 185 mg/dL — AB (ref 65–99)
Potassium: 3.9 mmol/L (ref 3.5–5.1)
Sodium: 138 mmol/L (ref 135–145)

## 2016-03-03 LAB — HIV ANTIBODY (ROUTINE TESTING W REFLEX): HIV SCREEN 4TH GENERATION: NONREACTIVE

## 2016-03-03 LAB — GLUCOSE, CAPILLARY
GLUCOSE-CAPILLARY: 196 mg/dL — AB (ref 65–99)
Glucose-Capillary: 129 mg/dL — ABNORMAL HIGH (ref 65–99)
Glucose-Capillary: 83 mg/dL (ref 65–99)
Glucose-Capillary: 156 mg/dL — ABNORMAL HIGH (ref 65–99)

## 2016-03-03 MED ORDER — TAMSULOSIN HCL 0.4 MG PO CAPS
0.4000 mg | ORAL_CAPSULE | Freq: Every day | ORAL | Status: DC
  Administered 2016-03-03: 0.4 mg via ORAL
  Filled 2016-03-03: qty 1

## 2016-03-03 MED ORDER — GLUCERNA SHAKE PO LIQD
237.0000 mL | Freq: Two times a day (BID) | ORAL | Status: DC
  Administered 2016-03-03 – 2016-03-04 (×2): 237 mL via ORAL

## 2016-03-03 MED ORDER — POLYETHYLENE GLYCOL 3350 17 G PO PACK
17.0000 g | PACK | Freq: Two times a day (BID) | ORAL | Status: DC
  Administered 2016-03-03 – 2016-03-04 (×3): 17 g via ORAL
  Filled 2016-03-03 (×3): qty 1

## 2016-03-03 MED ORDER — SODIUM CHLORIDE 0.9 % IV SOLN: INTRAVENOUS | Status: AC

## 2016-03-03 MED ORDER — GLUCERNA SHAKE PO LIQD: 237.0000 mL | Freq: Two times a day (BID) | ORAL | Status: DC

## 2016-03-03 NOTE — Progress Notes (Signed)
Medicine attending: I examined this patient today and reviewed pertinent clinical and laboratory data and I concur with the evaluation and management plan as recorded by resident physician Dr. Candace Cruise which we discussed together.  Modest improvement in creatinine since admission with ongoing high-volume diuresis however at this point, it is likely we are dealing with a major component of medical renal disease with an additional, minor, component of bladder outlet obstruction.  Initial urine analysis was negative with no protein, no casts, no hematuria making glomerular disease unlikely.  He has no signs or symptoms of a collagen vascular disorder.  He does have underlying risk factors for renal disease including his diabetes and hypertension.  We will go ahead and get a myeloma screen with SPEP, IFE, and kappa/lambda free light chain analysis.  We can get routine serologic tests and complements but I think they will be low yield. Interface with his primary care physician to discuss nephrology and urology referrals at time of discharge.

## 2016-03-03 NOTE — Progress Notes (Addendum)
   Subjective: Patient is doing well this morning. Complaining of some constipation, no BM in 3 days but wife reports he has had a poor appetite. He has had good UOP since foley placement, 3.4L over the past 24 hours. No abdominal pain.   Objective:  Vital signs in last 24 hours: Vitals:   03/02/16 1032 03/02/16 1842 03/02/16 2125 03/03/16 0608  BP: 140/76 (!) 155/77 (!) 155/84 (!) 174/81  Pulse: 83 83 85 83  Resp: 14 16 16 16   Temp: 97.6 F (36.4 C) 98.5 F (36.9 C) 99.4 F (37.4 C) 98.4 F (36.9 C)  TempSrc: Oral Oral Oral Tympanic  SpO2: 99% 100% 100% 99%  Weight:   165 lb (74.8 kg)   Height:       Physical Exam Constitutional: NAD, appears comfortable Cardiovascular: RRR, no murmurs, rubs, or gallops.  Pulmonary/Chest: CTAB, no wheezes, rales, or rhonchi.  Abdominal: Soft, non tender, non distended. +BS.  GU: Foley in place  Extremities: Warm and well perfused. Distal pulses intact. No edema.  Neurological: A&Ox3, CN II - XII grossly intact.   Assessment/Plan:  71 y.o. male with a PMH of type 2 diabetes and hypertension who presented with acute renal failure secondary to urinary retention, likely BPH.   Acute Renal Failure: Likely acute on chronic, possible bladder outlet obstruction from BPH. Prostate enlargement noted on CT abdomen pelvis. UOP improved with foley catheter, 3.4L since yesterday and net negative 1.6L. Morning labs are still pending. Patient will likely be discharge with foley catheter in place and plan to follow up urology outpatient.  -- Continue NS @ 125  -- Start flomax 0.4mg  daily  -- F/u HIV -- PSA 5.26 -- Renal US unremarkable  -- Daily BMP  ?Pancreatitis: Lipase elevated 102 on admission and fat stranding was noted on CT abdomen pelvis. However patient denies N/V and abdominal pain. He is tolerating some po but reports a poor appetite.   -- Clear liquid diet, advance as tolerated  -- NS @ 125  Constipation: Patient is complaining of  constipation, no BM in 3 days.  -- Changed miralax daily --> BID  Type 2 Diabetes  Last HbA1c 02/2016 8.1. Home medications include glipizide 5 mg daily and metformin 500 mg BID.  -- Hold home meds -- SSI - sensitive w HS coverage   HTN: Home medications include Amlodipine 5 mg daily, and lisinopril- HCTZ 20-25 mg daily. -- Continue home amlodipine  -- Holding home lisinopril-HCTZ in the setting of AKI   Coronary artery disease  -- Continue home ASA, atorvastatin 20 mg daily, and metoprolol-xl 50 mg daily   Pulmonary nodule: CT abdomen and pelvis incidentally revealed 2 mm left anterior lung base nodule. Pt states he is a non smoker and has no family hx of lung cancer. Will not need follow up imaging scheduled at this time.  -- F/u PCP  FEN: No fluids for now, replete lytes prn, advance diet as tolerated VTE ppx: Heparin  Code Status: FULL   Dispo: Anticipated discharge pending improvement in renal function.   Velna Ochs, MD 03/03/2016, 7:23 AM Pager: [phone]

## 2016-03-03 NOTE — Progress Notes (Signed)
Transitions of Care Pharmacy Note  Plan:  Educated on new tamsulosin. Reviewed indication, side effects, and dosing.  Addressed concerns regarding what blood pressure medications he is taking inpatient.   --------------------------------------------- Douglas Rose. is an 71 y.o. male who presents with a chief complaint of abdominal pain and bloating. Patient was found to have acute renal failure.  In anticipation of discharge, pharmacy has reviewed this patient's prior to admission medication history, as well as current inpatient medications listed per the [facility].  Current medication indications, dosing, frequency, and notable side effects reviewed with patient and family. patient and family verbalized understanding of current inpatient medication regimen and is aware that the After Visit Summary when presented, will represent the most accurate medication list at discharge.     Assessment: Understanding of regimen: good Understanding of indications: good Potential of compliance: good Barriers to Obtaining Medications: No  Patient instructed to contact inpatient pharmacy team with further questions or concerns if needed.    Time spent preparing for discharge counseling: 10 minutes Time spent counseling patient: 10 minutes   Thank you for allowing pharmacy to be a part of this patient's care.  Ihor Austin, PharmD PGY1 Pharmacy Resident Pager: [phone]

## 2016-03-03 NOTE — Progress Notes (Signed)
Initial Nutrition Assessment  DOCUMENTATION CODES:   Severe malnutrition in context of acute illness/injury  INTERVENTION:  Provide Glucerna Shake po TID, each supplement provides 220 kcal and 10 grams of protein.  Encourage adequate PO intake.   NUTRITION DIAGNOSIS:   Malnutrition (severe) related to acute illness as evidenced by energy intake < or equal to 50% for > or equal to 5 days, moderate depletions of muscle mass.  GOAL:   Patient will meet greater than or equal to 90% of their needs  MONITOR:   PO intake, Supplement acceptance, Diet advancement, Labs, Weight trends, Skin, I & O's  REASON FOR ASSESSMENT:   Malnutrition Screening Tool    ASSESSMENT:   71 y.o. male with a PMH of type 2 diabetes and hypertension who presented with acute renal failure secondary to urinary retention, likely BPH.   Pt has just been advanced to a full liquid diet. Meal completion has been 75-100% on a clear liquid diet. Pt reports having a decreased appetite which has been ongoing over the past 1 month. Pt reports he has only been able to consume 1 meal a day. Pt reports a 8-10 lb weight loss over the past 1 month. Pt with a 4.6-5.7% weight loss in 1 month. Pt is agreeable to nutritional supplements to aid in caloric and protein needs. RD to order. Pt encouraged to eat his food at meals.   Nutrition-Focused physical exam completed. Findings are no fat depletion, mild to moderate muscle depletion, and no edema.   Labs and mediations reviewed.   Diet Order:  Diet full liquid Room service appropriate? Yes; Fluid consistency: Thin  Skin:  Reviewed, no issues  Last BM:  2/23  Height:   Ht Readings from Last 1 Encounters:  03/02/16 5\' 11"  (1.803 m)    Weight:   Wt Readings from Last 1 Encounters:  03/02/16 165 lb (74.8 kg)    Ideal Body Weight:  78 kg  BMI:  Body mass index is 23.01 kg/m.  Estimated Nutritional Needs:   Kcal:  2000-2200  Protein:  90-100 grams  Fluid:   Per MD  EDUCATION NEEDS:   No education needs identified at this time  Douglas Parker, MS, RD, LDN Pager # [phone] After hours/ weekend pager # [phone]

## 2016-03-04 DIAGNOSIS — C9000 Multiple myeloma not having achieved remission: Secondary | ICD-10-CM

## 2016-03-04 DIAGNOSIS — D472 Monoclonal gammopathy: Secondary | ICD-10-CM

## 2016-03-04 LAB — BASIC METABOLIC PANEL
ANION GAP: 6 (ref 5–15)
BUN: 25 mg/dL — ABNORMAL HIGH (ref 6–20)
CALCIUM: 8.7 mg/dL — AB (ref 8.9–10.3)
CO2: 24 mmol/L (ref 22–32)
Chloride: 113 mmol/L — ABNORMAL HIGH (ref 101–111)
Creatinine, Ser: 3.71 mg/dL — ABNORMAL HIGH (ref 0.61–1.24)
GFR, EST AFRICAN AMERICAN: 17 mL/min — AB (ref >60)
GFR, EST NON AFRICAN AMERICAN: 15 mL/min — AB (ref >60)
GLUCOSE: 117 mg/dL — AB (ref 65–99)
POTASSIUM: 3.7 mmol/L (ref 3.5–5.1)
Sodium: 143 mmol/L (ref 135–145)

## 2016-03-04 LAB — KAPPA/LAMBDA LIGHT CHAINS
KAPPA, LAMDA LIGHT CHAIN RATIO: 46.65 — AB (ref 0.26–1.65)
Kappa free light chain: 858.4 mg/L — ABNORMAL HIGH (ref 3.3–19.4)
LAMDA FREE LIGHT CHAINS: 18.4 mg/L (ref 5.7–26.3)

## 2016-03-04 LAB — GLUCOSE, CAPILLARY
GLUCOSE-CAPILLARY: 105 mg/dL — AB (ref 65–99)
Glucose-Capillary: 213 mg/dL — ABNORMAL HIGH (ref 65–99)

## 2016-03-04 MED ORDER — METOPROLOL SUCCINATE ER 25 MG PO TB24
75.0000 mg | ORAL_TABLET | Freq: Every day | ORAL | Status: DC
  Administered 2016-03-04: 75 mg via ORAL
  Filled 2016-03-04: qty 1

## 2016-03-04 MED ORDER — METOPROLOL SUCCINATE ER 25 MG PO TB24: 75.0000 mg | ORAL_TABLET | Freq: Every day | ORAL | 1 refills | Status: DC

## 2016-03-04 MED ORDER — TAMSULOSIN HCL 0.4 MG PO CAPS: 0.4000 mg | ORAL_CAPSULE | Freq: Every day | ORAL | 2 refills | Status: DC

## 2016-03-04 MED ORDER — HYDROCHLOROTHIAZIDE 25 MG PO TABS
25.0000 mg | ORAL_TABLET | Freq: Every day | ORAL | Status: DC
  Filled 2016-03-04: qty 1

## 2016-03-04 NOTE — Progress Notes (Signed)
Patient discharge teaching given, including activity, diet, follow-up appoints, and medications. Patient verbalized understanding of all discharge instructions. IV access was d/c'd. Vitals are stable. Skin is intact except as charted in most recent assessments. Pt to be escorted out by NT, to be driven home by family.  Tonetta Napoles, MBA, BSN, RN 

## 2016-03-04 NOTE — Discharge Summary (Signed)
Medicine attending discharge note: I personally examined this patient on the day of discharge and I tested the accuracy of the discharge evaluation and plan as recorded by resident physician Dr. Candace Cruise.  Further details forthcoming in her discharge summary.  Clinical summary: 71 year old man with hypertension, coronary artery disease status post prior stent placement, hyperlipidemia, and type 2 diabetes on oral agents.  Lab values on record from January 2015 showed mild chronic renal insufficiency with creatinine 1.4 and estimated GFR 65 mL/min.  He presented on the day of the current admission March 01, 2016 with a 3 week history of mid abdominal pain and change in bowel habit.  On presentation to the emergency department he was found to have acute renal failure with BUN 39 and creatinine 5.1.  CT scan remarkable for mild peripancreatic inflammation, normal appearing kidneys without hydronephrosis or obstructing stones, no intra-abdominal adenopathy or splenomegaly.  Prostate gland was enlarged.  Bladder wall was thickened.  Incidental 2 mm left base pulmonary nodule noted.  A Foley catheter was placed with a working diagnosis of bladder outlet obstruction secondary to prostate hyperplasia. There was no abdominal tenderness or suprapubic tenderness at time of my initial exam. He had a normochromic anemia with hemoglobin 10.8, MCV 84.  Mild elevation of PSA 5.26.  Serum total protein 7.9, albumin 3.4.  Calcium 9.2.  Lipase 102. He had voluminous urine output.  Renal ultrasound confirmed CT findings with absence of hydronephrosis, normal size kidneys without focal mass, no obstructing stones.  Bladder decompressed with Foley catheter.He had no complaints of bone pain while hospitalized.  He denied any obstructive symptoms prior to admission and we felt it was reasonable to remove the Foley catheter.  He did well on a voiding trial.  He was started on Flomax a day before the Foley catheter was  removed. Despite good urine output, there was only a modest improvement of his creatinine down to 3.7 by discharge.  In view of previous renal insufficiency and other risk factors for renal disease, we felt that there was likely an additional reason for his progressive renal dysfunction.  Screening tests to exclude multiple myeloma were ordered.  After the patient was discharged,  markedly abnormal results returned with serum light chain analysis showing kappa free light chains 858 mg percent, lambda 18.4, with a ratio of 46.65 virtually diagnostic for multiple myeloma. Quantitative immunoglobulins and immunofixation electrophoresis of the serum are pending.  Disposition: Condition stable at time of discharge Abnormal laboratories returned after discharge. Patient will be called back to complete an evaluation including a 24-hour urine for creatinine clearance, total protein, and immunofixation electrophoresis, skeletal bone survey, and a bone marrow aspiration and biopsy will be scheduled. We will be in touch with his primary care physician to coordinate further care.  If myeloma diagnosis confirmed, he will need expeditious oncology referral and treatment.

## 2016-03-04 NOTE — Progress Notes (Signed)
Subjective: Patient feels well today and is excited about the prospect of discharge home. He has no complaints. Foley catheter is in place, UOP recorded as almost 4L since yesterday. Renal function is somewhat improved this morning.   Objective:  Vital signs in last 24 hours: Vitals:   03/03/16 1700 03/03/16 2310 03/04/16 0503 03/04/16 0946  BP: (!) 171/83 (!) 171/78 (!) 156/76 (!) 163/78  Pulse: 80 86 74 83  Resp: 20 20 19 18   Temp: 98.3 F (36.8 C) 98.4 F (36.9 C) 98.5 F (36.9 C) 97.9 F (36.6 C)  TempSrc: Oral Oral Oral Oral  SpO2: 100% 100% 98% 97%  Weight:  167 lb 1.6 oz (75.8 kg)    Height:       Physical Exam Constitutional: NAD, appears comfortable Cardiovascular: RRR, no murmurs, rubs, or gallops.  Pulmonary/Chest: CTAB, no wheezes, rales, or rhonchi.  Abdominal: Soft, non tender, non distended. +BS.  GU: Foley in place  Extremities: Warm and well perfused. Distal pulses intact. No edema.  Neurological: A&Ox3, CN II - XII grossly intact.   Assessment/Plan:  71 y.o. malewith a PMH of type 2 diabetes and hypertensionwho presented with acute renal failure secondary to urinary retention, likely BPH.   Acute Renal Failure: Likely acute on chronic, from medical renal disease (?HTN, DM) and bladder outlet obstruction from BPH. Prostate enlargement was noted on CT abdomen pelvis, no evidence of hydronephrosis. 1.5L urine was drained after foley placement on admission. Patient denies any symptoms of urinary retention or BPH and says he has been urinating normally at home. He continues to have good UOP with foley in place and diuresing well with IVFs, almost 4L out since yesterday. Renal function moderately improved since admission. I am hopeful that creatinine will continue to downtrend towards his baseline. He will need outpatient nephrology follow up. Flomax was started yesterday. We will do a voiding trial today without the foley catheter. If he fails, he will need to be  discharged with catheter in place and follow up with outpatient urology.  -- Voiding trial, post void residual bladder scan  -- Continue NS @ 125  -- Continue flomax 0.4mg  daily  -- HIV negative  -- Daily BMP  HTN: Home medications include Amlodipine 5 mg daily, metoprolol 50 mg daily, and lisinopril- HCTZ 20-25 mg daily. We have been holding lisinopril-HCTZ due to AKI. Patient has been hypertensive with SBP 150s - 170s. Will increase metoprolol today, continue to hold ACEi and diuretic.  -- Continue home amlodipine  -- Increase Toprol-XL to 75 mg daily  -- Holding home lisinopril-HCTZ in the setting of AKI   Constipation: Patient is complaining of constipation, no BM in 3 days.  -- Changed miralax daily --> BID  Type 2 Diabetes  Last HbA1c 02/2016 8.1. Home medications include glipizide 5 mg daily and metformin 500 mg BID.  -- Hold home meds -- SSI - sensitive w HS coverage   Coronary artery disease  -- Continue home ASA, atorvastatin 20 mg daily, and metoprolol-xl 50 mg daily   Pulmonary nodule: CT abdomen and pelvis incidentally revealed 2 mm left anterior lung base nodule. Pt states he is a non smoker and has no family hx of lung cancer. Will not need follow up imaging scheduled at this time.  -- F/u PCP  FEN: No fluids for now, replete lytes prn, regular diet VTE ppx: Heparin  Code Status: FULL   Dispo: Anticipated discharge today pending voiding trial.   Velna Ochs, MD 03/04/2016, 11:14  AM Pager: [phone]

## 2016-03-04 NOTE — Discharge Summary (Signed)
Name: Douglas Rose. MRN: [id number] DOB: [birth year] 71 y.o. PCP: [name], MD  Date of Admission: 03/01/2016  8:28 PM Date of Discharge: 03/04/2016 Attending Physician: Dr. Annia Belt  Discharge Diagnosis: 1. Multiple Myeloma  2. Acute Renal Failure   Discharge Medications: Allergies as of 03/04/2016   No Known Allergies     Medication List    STOP taking these medications   lisinopril-hydrochlorothiazide 20-25 MG tablet Commonly known as:  PRINZIDE,ZESTORETIC     TAKE these medications   amLODipine 10 MG tablet Commonly known as:  NORVASC Take 10 mg by mouth daily.   aspirin 81 MG tablet Take 81 mg by mouth daily.   atorvastatin 20 MG tablet Commonly known as:  LIPITOR Take 1 tablet (20 mg total) by mouth daily.   glipiZIDE 5 MG tablet Commonly known as:  GLUCOTROL Take 10 mg by mouth every morning.   JANUMET XR [phone] MG Tb24 Generic drug:  SitaGLIPtin-MetFORMIN HCl Take 1 tablet by mouth daily.   metFORMIN 1000 MG tablet Commonly known as:  GLUCOPHAGE Take 500 mg by mouth 2 (two) times daily with a meal.   metoprolol succinate 25 MG 24 hr tablet Commonly known as:  TOPROL-XL Take 3 tablets (75 mg total) by mouth daily. Take with or immediately following a meal. Start taking on:  03/05/2016 What changed:  medication strength  how much to take  additional instructions   nitroGLYCERIN 0.4 MG SL tablet Commonly known as:  NITROSTAT Place 1 tablet (0.4 mg total) under the tongue every 5 (five) minutes x 3 doses as needed for chest pain.   tamsulosin 0.4 MG Caps capsule Commonly known as:  FLOMAX Take 1 capsule (0.4 mg total) by mouth daily after supper.       Disposition and follow-up:   [name]. was discharged from [facility] in Stable condition.  At the hospital follow up visit please address:  1.  Multiple Myeloma: Patient presented with acute renal failure, initially felt to be acute on  chronic from medical renal disease (HTN, DM) and bladder outlet obstruction from BPH. MM screen was checked and pending on discharge. Unfortunately, shortly after patient left the hospital, kappa free light chains resulted and were elevated at 858 with an elevated kappa, lamda light chain ratio of 46. He has now been scheduled to follow up with Dr. Beryle Beams in the Internal Medicine Clinic on Feb 29th at 3:30 pm for further work up and evaluation. Patient was called with the appointment time and instructed to arrive 30 minutes early for blood work.   2.  Labs / imaging needed at time of follow-up: Per Oncology (Dr. Beryle Beams)   3.  Pending labs/ test needing follow-up: IFE, SPEP  Follow-up Appointments: Follow-up Information    Suzanna Obey, MD. Go on 03/11/2016.   Specialty:  Family Medicine Why:  at 2:45pm for hospital follow up  Contact information: [place] [address] [place] by problem list:  1. Multiple Myeloma: Patient is a 71 yo AA male who presented to the ED with acute renal failure. He presented with complaints of suprapubic abdominal pain and bloating x 3 weeks. On laboratory evaluation in the ED he was found to have an elevated BUN and creatinine of 39 and 5.1 respectively. CT abdomen and pelvis revealed non specific bilateral perinephric fat stranding, a thickened urinary bladder wall, and an  enlarged prostate - but no hydronephrosis. UA was notable for small hematuria but otherwise negative. There was initial concern for urinary retention due to bladder outlet obstruction from BPH, and a foley catheter was placed with 1.5L output. PSA was also checked and mildly elevated, 5.26. He was admitted and placed on continuous maintainence fluids. He continued to have good UOP with foley catheter in place, and diuresed well with fluids putting out almost 4L a day. He was also started on Flomax 0.30m daily. Renal  function only moderate improved on discharge, creatinine 5.1 -> 3.7.  Foley catheter was removed prior to discharge with a successful voiding trial. He was discharged with plans to follow up with his PCP, outpatient urology, and outpatient nephrology. A multiple myeloma screen (SPEP, IFE, and Kappa/lambda light chains) was also checked during his stay and pending on discharge. Unfortunately, shortly after patient left the hospital, kappa free light chains resulted and were elevated at 858 with an elevated kappa, lamda light chain ratio of 46. Patient has now been scheduled to follow up with Dr. GBeryle Beamsin the Internal Medicine clinic on 2/29/2018 for further work up. He was called 03/05/16 (the day after discharge) and informed that his lab tests were abnormal. He is agreeable to come in tomorrow for follow up.  2. Elevated Lipase: CT abdomen pelvis on admission showed mild inflammatory pancreatic changes concerning for possible pancreatitis. Lipase was mildly elevated 102. Physical exam was reassuring and patient was tolerating PO without N/V. Clinically patient did not appear to have pancreatitis.   3. HTN: Home amlodipine 10 mg daily and metoprolol 50 mg daily were continued. His home lisinopril-HCTZ 20-25 mg was held due to his renal dysfunction. BP up-trended during his stay with SBP in the 150s-170s. Metoprolol was increased to 75 mg daily. He was instructed to stop taking his lisinopril-HCTZ until he followed up with his PCP.   4. Pulmonary Nodule: CT abdomen and pelvis incidentally revealed 2 mm left anterior lung base nodule. Pt states he is a non smoker and has no family hx of lung cancer. This was initially felt to be low risk without need for follow up imaging. However, given his newly diagnosed multiple myeloma, patient may benefit from a CT chest. Defer to oncology.   Discharge Vitals:   BP (!) 163/78 (BP Location: Left Arm)   Pulse 83   Temp 97.9 F (36.6 C) (Oral)   Resp 18   Ht 5'  11" (1.803 m)   Wt 167 lb 1.6 oz (75.8 kg)   SpO2 97%   BMI 23.31 kg/m   Pertinent Labs, Studies, and Procedures:   03/02/2016 CT Abdomen Pelvis Wo Contrast: IMPRESSION: 1. Indistinct appearance of the neck and body of pancreas with suggestion of mild inflammatory change, findings may relate to a mild pancreatitis, suggest correlation with laboratory values. 2. Nonspecific bilateral perinephric fat stranding. No ureteral stones. No hydronephrosis. Thick-walled urinary bladder could be secondary to cystitis or chronic obstruction. 3. Enlarged prostate gland 4. 2 mm left anterior lung base pulmonary nodule. No follow-up needed if patient is low-risk. Non-contrast chest CT can be considered in 12 months if patient is high-risk. This recommendation follows the consensus statement: Guidelines for Management of Incidental Pulmonary Nodules Detected on CT Images: From the Fleischner Society 2017; Radiology 2017; 2681:275-170  03/02/2016 Renal [place] FINDINGS: Right Kidney: Length: 11.0. Echogenicity within normal limits. No mass or hydronephrosis visualized.  Left Kidney: Length: 11.6. Echogenicity within normal limits. No mass or hydronephrosis visualized.  Bladder:  Completely decompressed with a Foley catheter in place.  IMPRESSION: Negative exam.   Discharge Instructions: Discharge Instructions    Call MD for:  persistant nausea and vomiting    Complete by:  As directed    Call MD for:  severe uncontrolled pain    Complete by:  As directed    Call MD for:  temperature >100.4    Complete by:  As directed    Diet - low sodium heart healthy    Complete by:  As directed    Discharge instructions    Complete by:  As directed    Mr. Pavek, it was a pleasure taking care of you. I am glad you are feeling better! We have made a few changes to your medications. Please take Flomax 0.4 mg daily for your enlarged prostate. For your blood pressure, please stop taking your  lisinopril-HCTZ as this can be harmful to your kidneys. Instead, I have increased the dose of your metoprolol to 75 mg (3 tablets) daily. I have sent prescriptions for these medications to your pharmacy. You are scheduled to follow up with your primary care doctor on March 6th at 2:45pm. Please keep this appointment. You will also need to follow up with Urology and Nephrology, please ask your primary care doctor to refer you. I will send her my recommendations. Thank you!   Increase activity slowly    Complete by:  As directed       Signed: Velna Ochs, MD 03/04/2016, 4:48 PM   Pager: [phone]

## 2016-03-05 ENCOUNTER — Other Ambulatory Visit: Payer: Self-pay | Admitting: Oncology

## 2016-03-05 DIAGNOSIS — D472 Monoclonal gammopathy: Secondary | ICD-10-CM

## 2016-03-05 DIAGNOSIS — N179 Acute kidney failure, unspecified: Secondary | ICD-10-CM

## 2016-03-06 ENCOUNTER — Ambulatory Visit (INDEPENDENT_AMBULATORY_CARE_PROVIDER_SITE_OTHER): Payer: Medicare HMO | Admitting: Oncology

## 2016-03-06 ENCOUNTER — Ambulatory Visit (HOSPITAL_COMMUNITY)
Admission: RE | Admit: 2016-03-06 | Discharge: 2016-03-06 | Disposition: A | Discharge status: Home / Self Care | Payer: Medicare HMO | Source: Ambulatory Visit | Attending: Oncology | Admitting: Oncology

## 2016-03-06 ENCOUNTER — Encounter: Payer: Self-pay | Admitting: Oncology

## 2016-03-06 VITALS — BP 157/83 | HR 76 | Temp 98.1°F | Wt 163.3 lb

## 2016-03-06 DIAGNOSIS — D472 Monoclonal gammopathy: Secondary | ICD-10-CM | POA: Diagnosis present

## 2016-03-06 DIAGNOSIS — R590 Localized enlarged lymph nodes: Secondary | ICD-10-CM

## 2016-03-06 DIAGNOSIS — N179 Acute kidney failure, unspecified: Secondary | ICD-10-CM | POA: Diagnosis not present

## 2016-03-06 DIAGNOSIS — R591 Generalized enlarged lymph nodes: Secondary | ICD-10-CM

## 2016-03-06 HISTORY — DX: Acute kidney failure, unspecified: N17.9

## 2016-03-06 LAB — CBC WITH DIFFERENTIAL/PLATELET
BASOS ABS: 0 10*3/uL (ref 0.0–0.1)
Basophils Relative: 0 %
EOS PCT: 1 %
Eosinophils Absolute: 0.1 10*3/uL (ref 0.0–0.7)
HCT: 34.7 % — ABNORMAL LOW (ref 39.0–52.0)
HEMOGLOBIN: 11.4 g/dL — AB (ref 13.0–17.0)
LYMPHS ABS: 1.3 10*3/uL (ref 0.7–4.0)
LYMPHS PCT: 24 %
MCH: 27.5 pg (ref 26.0–34.0)
MCHC: 32.9 g/dL (ref 30.0–36.0)
MCV: 83.8 fL (ref 78.0–100.0)
Monocytes Absolute: 0.3 10*3/uL (ref 0.1–1.0)
Monocytes Relative: 5 %
Neutro Abs: 3.7 10*3/uL (ref 1.7–7.7)
Neutrophils Relative %: 70 %
Platelets: 217 10*3/uL (ref 150–400)
RBC: 4.14 MIL/uL — AB (ref 4.22–5.81)
RDW: 14 % (ref 11.5–15.5)
WBC: 5.3 10*3/uL (ref 4.0–10.5)

## 2016-03-06 LAB — COMPREHENSIVE METABOLIC PANEL
ALK PHOS: 67 U/L (ref 38–126)
ALT: 81 U/L — AB (ref 17–63)
AST: 113 U/L — AB (ref 15–41)
Albumin: 3.6 g/dL (ref 3.5–5.0)
Anion gap: 8 (ref 5–15)
BILIRUBIN TOTAL: 0.6 mg/dL (ref 0.3–1.2)
BUN: 22 mg/dL — AB (ref 6–20)
CALCIUM: 9.5 mg/dL (ref 8.9–10.3)
CHLORIDE: 107 mmol/L (ref 101–111)
CO2: 24 mmol/L (ref 22–32)
CREATININE: 3.61 mg/dL — AB (ref 0.61–1.24)
GFR, EST AFRICAN AMERICAN: 18 mL/min — AB (ref >60)
GFR, EST NON AFRICAN AMERICAN: 15 mL/min — AB (ref >60)
Glucose, Bld: 169 mg/dL — ABNORMAL HIGH (ref 65–99)
Potassium: 3.8 mmol/L (ref 3.5–5.1)
Sodium: 139 mmol/L (ref 135–145)
TOTAL PROTEIN: 8.8 g/dL — AB (ref 6.5–8.1)

## 2016-03-06 LAB — IMMUNOFIXATION ELECTROPHORESIS
IGM, SERUM: 31 mg/dL (ref 15–143)
IgA: 107 mg/dL (ref 61–437)
IgG (Immunoglobin G), Serum: 2499 mg/dL — ABNORMAL HIGH (ref 700–1600)
Total Protein ELP: 7.5 g/dL (ref 6.0–8.5)

## 2016-03-06 LAB — PROTEIN ELECTROPHORESIS, SERUM
A/G Ratio: 0.7 (ref 0.7–1.7)
ALBUMIN ELP: 3.1 g/dL (ref 2.9–4.4)
Alpha-1-Globulin: 0.3 g/dL (ref 0.0–0.4)
Alpha-2-Globulin: 0.9 g/dL (ref 0.4–1.0)
BETA GLOBULIN: 0.9 g/dL (ref 0.7–1.3)
GAMMA GLOBULIN: 2.4 g/dL — AB (ref 0.4–1.8)
Globulin, Total: 4.6 g/dL — ABNORMAL HIGH (ref 2.2–3.9)
M-SPIKE, %: 1.7 g/dL — AB
TOTAL PROTEIN ELP: 7.7 g/dL (ref 6.0–8.5)

## 2016-03-06 LAB — LACTATE DEHYDROGENASE: LDH: 253 U/L — AB (ref 98–192)

## 2016-03-06 NOTE — Patient Instructions (Addendum)
Schedule bone marrow aspiration and biopsy with Interventional Radiology as soon as possible To Radiology today for skeletal bone survey Urgent consult with Dr Irene Limbo at [facility]  We will contact you as results become available.

## 2016-03-06 NOTE — Progress Notes (Signed)
Hematology and Oncology Follow Up Visit  Douglas Rose [id number] 1945/07/17 71 y.o. 03/06/2016 6:10 PM   Principle Diagnosis: Encounter Diagnoses  Name Primary?  . Monoclonal gammopathy   . Acute renal failure, unspecified acute renal failure type ([place])   . Posterior cervical adenopathy Yes   Interim History:  71 year old man I recently saw when I was attending on the inpatient medical service.  He came to the emergency department on February 24 for evaluation of periumbilical abdominal pain.  He was found to be in acute renal failure with BUN 39 and creatinine 5.06.  Most recent kidney functions in our system from January 11, 2013 when BUN was 27 and creatinine 1.4.  He is hypertensive and diabetic. CT scan of the abdomen did not show hydronephrosis or nephrolithiasis.  Ultrasound of the kidneys also normal.  The CT scan did show an enlarged prostate and a thickened bladder wall.  A Foley catheter was placed in the emergency department and there was a large volume of residual urine.  Urine analysis did not show an active sediment and was negative for protein on dipstick.  Lipase was elevated at 102 and there was some mild peripancreatic stranding but overnight the patient's abdominal pain resolved.  Abdominal exam was benign.  He does not use alcohol.  Liver functions were normal.  Borderline decrease in albumin at 3.4 g percent.  Normal transaminases and alkaline phosphatase, and bilirubin. He continued to diuresis well but after 48 hours only a moderate improvement in his creatinine down to 4.1.  Estimated GFR 15.  Other potential etiologies of acute renal failure were explored. Serum free light chain analysis showed a marked elevation of kappa free light chains at 858 mg percent, lambda normal 18.4 mg percent, ratio 46.65.  Results returned the day the patient left the hospital.  Additional results returned today.  There is an M spike in the gamma region 2.4 g percent.  Immunofixation  electrophoresis shows an IgG monoclonal protein with kappa specificity.  Total IgG immunoglobulin 2499 mg percent without suppression of IgA, 107 mg percent, or IgM, 31 mg percent. Calcium 9.2 with albumin 3.4 on February 24. He has no complaints of bone pain. Skeletal bone survey will be done later today. 24-hour urine will be collected for total protein, creatinine clearance, and IFE. Bone marrow biopsy has been scheduled.  Prior to admission he was under evaluation for an approximate 3 cm right posterior cervical lymph gland enlargement.  Referral to ear nose and throat surgery did not occur due to the interim hospitalization.  Medications: reviewed  Allergies: No Known Allergies  Review of Systems: See interim history. Remaining ROS negative:   Physical Exam: Blood pressure (!) 157/83, pulse 76, temperature 98.1 F (36.7 C), temperature source Oral, weight 163 lb 4.8 oz (74.1 kg), SpO2 100 %. Wt Readings from Last 3 Encounters:  03/06/16 163 lb 4.8 oz (74.1 kg)  03/03/16 167 lb 1.6 oz (75.8 kg)  01/11/13 172 lb (78 kg)     General appearance: Thin, adequately nourished, African-American man HENNT: Pharynx no erythema, exudate, mass, or ulcer. No thyromegaly or thyroid nodules Lymph nodes: Approximate 3 cm high posterior right cervical rubbery movable lymph node palpable, no supraclavicular, or axillary lymphadenopathy Remainder of the exam done during recent hospitalization and not repeated today.   Lab Results: CBC W/Diff    Component Value Date/Time   WBC 5.3 03/06/2016 1445   RBC 4.14 (L) 03/06/2016 1445   HGB 11.4 (L) 03/06/2016 1445  HCT 34.7 (L) 03/06/2016 1445   PLT 217 03/06/2016 1445   MCV 83.8 03/06/2016 1445   MCH 27.5 03/06/2016 1445   MCHC 32.9 03/06/2016 1445   RDW 14.0 03/06/2016 1445   LYMPHSABS 1.3 03/06/2016 1445   MONOABS 0.3 03/06/2016 1445   EOSABS 0.1 03/06/2016 1445   BASOSABS 0.0 03/06/2016 1445     Chemistry      Component Value  Date/Time   NA 139 03/06/2016 1445   K 3.8 03/06/2016 1445   CL 107 03/06/2016 1445   CO2 24 03/06/2016 1445   BUN 22 (H) 03/06/2016 1445   CREATININE 3.61 (H) 03/06/2016 1445      Component Value Date/Time   CALCIUM 9.5 03/06/2016 1445   ALKPHOS 67 03/06/2016 1445   AST 113 (H) 03/06/2016 1445   ALT 81 (H) 03/06/2016 1445   BILITOT 0.6 03/06/2016 1445       Radiological Studies: Ct Abdomen Pelvis Wo Contrast  Result Date: 03/02/2016 CLINICAL DATA:  Upper abdominal pain, difficulty urinating EXAM: CT ABDOMEN AND PELVIS WITHOUT CONTRAST TECHNIQUE: Multidetector CT imaging of the abdomen and pelvis was performed following the standard protocol without IV contrast. COMPARISON:  None. FINDINGS: Lower chest: No significant pleural effusion. 2 mm pulmonary nodule anterior left lung base. Borderline cardiomegaly. Small esophageal hiatal hernia. Hepatobiliary: No focal liver abnormality is seen. No gallstones, gallbladder wall thickening, or biliary dilatation. Pancreas: Mildly indistinct appearance of the neck and body of pancreas with suggestion of mild surrounding soft tissue stranding. Spleen: Normal in size without focal abnormality. Adrenals/Urinary Tract: Adrenal glands are within normal limits. Nonspecific bilateral perinephric fat stranding. No hydronephrosis. Thick-walled urinary bladder with small air and a Foley catheter Stomach/Bowel: Stomach is within normal limits. Appendix appears normal. No evidence of bowel wall thickening, distention, or inflammatory changes. Vascular/Lymphatic: Aortic atherosclerosis. No enlarged abdominal or pelvic lymph nodes. Reproductive: Enlarged prostate gland. Other: No significant free air or free fluid. Musculoskeletal: No acute or suspicious bone lesions. IMPRESSION: 1. Indistinct appearance of the neck and body of pancreas with suggestion of mild inflammatory change, findings may relate to a mild pancreatitis, suggest correlation with laboratory values. 2.  Nonspecific bilateral perinephric fat stranding. No ureteral stones. No hydronephrosis. Thick-walled urinary bladder could be secondary to cystitis or chronic obstruction. 3. Enlarged prostate gland 4. 2 mm left anterior lung base pulmonary nodule. No follow-up needed if patient is low-risk. Non-contrast chest CT can be considered in 12 months if patient is high-risk. This recommendation follows the consensus statement: Guidelines for Management of Incidental Pulmonary Nodules Detected on CT Images: From the Fleischner Society 2017; Radiology 2017; 284:228-243. Electronically Signed   By: Donavan Foil M.D.   On: 03/02/2016 01:14   US Renal  Result Date: 03/02/2016 CLINICAL DATA:  Acute kidney injury. Patient admitted with pancreatitis 03/02/2016. 3.5 week history of abdominal pain. EXAM: RENAL / URINARY TRACT ULTRASOUND COMPLETE COMPARISON:  CT abdomen and pelvis 03/02/2016. FINDINGS: Right Kidney: Length: 11.0. Echogenicity within normal limits. No mass or hydronephrosis visualized. Left Kidney: Length: 11.6. Echogenicity within normal limits. No mass or hydronephrosis visualized. Bladder: Completely decompressed with a Foley catheter in place. IMPRESSION: Negative exam. Electronically Signed   By: Inge Rise M.D.   On: 03/02/2016 11:57    Impression: 1.  Acute renal failure with findings of a marked elevation of kappa serum free light chain and significant elevation of kappa/lambda free light chain ratio with only a moderate elevation of total IgG and no concomitant suppression of IgA or  IgM.  Bone survey and ancillary laboratory studies to include LDH, and beta-2 microglobulin, are pending.  24-hour urine collection for total protein and IFE pending.  He will be scheduled for a bone marrow aspiration and biopsy.  Further recommendations pending results of this evaluation. At present, laboratory values alone do not meet criteria for active myeloma unless we demonstrate that his acute renal failure  is related to light chain proteinuria or bone marrow plasma cells are greater than 10%. I had an approximate 1 hour discussion with the patient and his wife about the possible diagnosis of myeloma and what the treatment would involve with respect to induction, consolidation, and maintenance treatment.  I gave them written notes to summarize treatments if in fact we secure the diagnosis of active myeloma. I will expedite his evaluation so that if he does have myeloma we can initiate treatment promptly and hopefully not see further deterioration in his renal function.  2.  Enlarged right posterior cervical lymph node. This still needs evaluation as well.    CC: Patient Care Team: [name], MD as PCP - General (Family Medicine)   Murriel Hopper, MD, [place]  Hematology-Oncology/Internal Medicine  3/1/20186:10 PM

## 2016-03-07 ENCOUNTER — Other Ambulatory Visit: Payer: Self-pay | Admitting: Hematology

## 2016-03-07 LAB — BETA 2 MICROGLOBULIN, SERUM: BETA 2 MICROGLOBULIN: 4.4 mg/L — AB (ref 0.6–2.4)

## 2016-03-10 ENCOUNTER — Other Ambulatory Visit: Payer: Medicare HMO

## 2016-03-10 ENCOUNTER — Telehealth: Payer: Self-pay | Admitting: Hematology

## 2016-03-10 DIAGNOSIS — N179 Acute kidney failure, unspecified: Secondary | ICD-10-CM

## 2016-03-10 DIAGNOSIS — D472 Monoclonal gammopathy: Secondary | ICD-10-CM

## 2016-03-10 NOTE — Telephone Encounter (Signed)
Spoke to the pt's wife and scheduled an appt for the pt to see [name] on 3/8 at 12pm. Aware to arrive 30 minutes early. Voiced understanding.

## 2016-03-11 ENCOUNTER — Other Ambulatory Visit: Payer: Self-pay | Admitting: Radiology

## 2016-03-11 LAB — UPEP/TP, 24-HR URINE
ALPHA 1 UR: 0.4 %
ALPHA 2 UR: 1.6 %
Albumin, U: 3.2 %
Beta, Urine: 91.8 %
GAMMA UR: 3 %
M-SPIKE, %-U24PEL: 85.9 % — AB
M-Spike, mg/24 hr: 1318.7 mg/24 hr — ABNORMAL HIGH
PROTEIN UR: 80.8 mg/dL
Protein, 24H Urine: 1535 mg/24 hr — ABNORMAL HIGH (ref 30–150)

## 2016-03-13 ENCOUNTER — Encounter (HOSPITAL_COMMUNITY): Payer: Self-pay

## 2016-03-13 ENCOUNTER — Ambulatory Visit (HOSPITAL_COMMUNITY)
Admission: RE | Admit: 2016-03-13 | Discharge: 2016-03-13 | Disposition: A | Discharge status: Home / Self Care | Payer: Medicare HMO | Source: Ambulatory Visit | Attending: Oncology | Admitting: Oncology

## 2016-03-13 ENCOUNTER — Ambulatory Visit (HOSPITAL_BASED_OUTPATIENT_CLINIC_OR_DEPARTMENT_OTHER): Payer: Medicare HMO | Admitting: Hematology

## 2016-03-13 ENCOUNTER — Encounter: Payer: Self-pay | Admitting: Hematology

## 2016-03-13 ENCOUNTER — Other Ambulatory Visit: Payer: Self-pay | Admitting: Oncology

## 2016-03-13 VITALS — BP 184/94 | HR 79 | Temp 97.8°F | Resp 18 | Ht 71.0 in | Wt 161.4 lb

## 2016-03-13 DIAGNOSIS — Z833 Family history of diabetes mellitus: Secondary | ICD-10-CM | POA: Diagnosis not present

## 2016-03-13 DIAGNOSIS — I2510 Atherosclerotic heart disease of native coronary artery without angina pectoris: Secondary | ICD-10-CM | POA: Insufficient documentation

## 2016-03-13 DIAGNOSIS — Z7982 Long term (current) use of aspirin: Secondary | ICD-10-CM | POA: Diagnosis not present

## 2016-03-13 DIAGNOSIS — R590 Localized enlarged lymph nodes: Secondary | ICD-10-CM | POA: Diagnosis not present

## 2016-03-13 DIAGNOSIS — D649 Anemia, unspecified: Secondary | ICD-10-CM | POA: Diagnosis not present

## 2016-03-13 DIAGNOSIS — I10 Essential (primary) hypertension: Secondary | ICD-10-CM

## 2016-03-13 DIAGNOSIS — C9030 Solitary plasmacytoma not having achieved remission: Secondary | ICD-10-CM | POA: Diagnosis not present

## 2016-03-13 DIAGNOSIS — N179 Acute kidney failure, unspecified: Secondary | ICD-10-CM | POA: Insufficient documentation

## 2016-03-13 DIAGNOSIS — D472 Monoclonal gammopathy: Secondary | ICD-10-CM | POA: Diagnosis present

## 2016-03-13 DIAGNOSIS — Z79899 Other long term (current) drug therapy: Secondary | ICD-10-CM | POA: Diagnosis not present

## 2016-03-13 DIAGNOSIS — Z9889 Other specified postprocedural states: Secondary | ICD-10-CM | POA: Insufficient documentation

## 2016-03-13 DIAGNOSIS — Z8249 Family history of ischemic heart disease and other diseases of the circulatory system: Secondary | ICD-10-CM | POA: Insufficient documentation

## 2016-03-13 DIAGNOSIS — N08 Glomerular disorders in diseases classified elsewhere: Secondary | ICD-10-CM

## 2016-03-13 DIAGNOSIS — E119 Type 2 diabetes mellitus without complications: Secondary | ICD-10-CM | POA: Diagnosis not present

## 2016-03-13 DIAGNOSIS — I119 Hypertensive heart disease without heart failure: Secondary | ICD-10-CM | POA: Diagnosis not present

## 2016-03-13 DIAGNOSIS — N289 Disorder of kidney and ureter, unspecified: Secondary | ICD-10-CM | POA: Diagnosis not present

## 2016-03-13 DIAGNOSIS — E785 Hyperlipidemia, unspecified: Secondary | ICD-10-CM | POA: Insufficient documentation

## 2016-03-13 DIAGNOSIS — Z7984 Long term (current) use of oral hypoglycemic drugs: Secondary | ICD-10-CM | POA: Diagnosis not present

## 2016-03-13 DIAGNOSIS — C9000 Multiple myeloma not having achieved remission: Secondary | ICD-10-CM

## 2016-03-13 LAB — GLUCOSE, CAPILLARY: GLUCOSE-CAPILLARY: 122 mg/dL — AB (ref 65–99)

## 2016-03-13 LAB — BASIC METABOLIC PANEL
ANION GAP: 7 (ref 5–15)
BUN: 25 mg/dL — ABNORMAL HIGH (ref 6–20)
CHLORIDE: 108 mmol/L (ref 101–111)
CO2: 25 mmol/L (ref 22–32)
Calcium: 9.4 mg/dL (ref 8.9–10.3)
Creatinine, Ser: 3.59 mg/dL — ABNORMAL HIGH (ref 0.61–1.24)
GFR calc non Af Amer: 16 mL/min — ABNORMAL LOW (ref >60)
GFR, EST AFRICAN AMERICAN: 18 mL/min — AB (ref >60)
Glucose, Bld: 118 mg/dL — ABNORMAL HIGH (ref 65–99)
POTASSIUM: 3.8 mmol/L (ref 3.5–5.1)
SODIUM: 140 mmol/L (ref 135–145)

## 2016-03-13 LAB — CBC WITH DIFFERENTIAL/PLATELET
BASOS PCT: 0 %
Basophils Absolute: 0 10*3/uL (ref 0.0–0.1)
EOS ABS: 0.1 10*3/uL (ref 0.0–0.7)
Eosinophils Relative: 1 %
HEMATOCRIT: 32.3 % — AB (ref 39.0–52.0)
Hemoglobin: 10.7 g/dL — ABNORMAL LOW (ref 13.0–17.0)
LYMPHS ABS: 2.2 10*3/uL (ref 0.7–4.0)
Lymphocytes Relative: 39 %
MCH: 27.5 pg (ref 26.0–34.0)
MCHC: 33.1 g/dL (ref 30.0–36.0)
MCV: 83 fL (ref 78.0–100.0)
Monocytes Absolute: 0.6 10*3/uL (ref 0.1–1.0)
Monocytes Relative: 11 %
NEUTROS ABS: 2.8 10*3/uL (ref 1.7–7.7)
NEUTROS PCT: 49 %
PLATELETS: 224 10*3/uL (ref 150–400)
RBC: 3.89 MIL/uL — ABNORMAL LOW (ref 4.22–5.81)
RDW: 14.5 % (ref 11.5–15.5)
WBC: 5.6 10*3/uL (ref 4.0–10.5)

## 2016-03-13 LAB — PROTIME-INR
INR: 1.03
Prothrombin Time: 13.5 seconds (ref 11.4–15.2)

## 2016-03-13 MED ORDER — FENTANYL CITRATE (PF) 100 MCG/2ML IJ SOLN
INTRAMUSCULAR | Status: AC
  Filled 2016-03-13: qty 4

## 2016-03-13 MED ORDER — MIDAZOLAM HCL 2 MG/2ML IJ SOLN
INTRAMUSCULAR | Status: AC
  Filled 2016-03-13: qty 6

## 2016-03-13 MED ORDER — HYDROCODONE-ACETAMINOPHEN 5-325 MG PO TABS: 1.0000 | ORAL_TABLET | ORAL | Status: DC | PRN

## 2016-03-13 MED ORDER — SODIUM CHLORIDE 0.9 % IV SOLN
INTRAVENOUS | Status: DC
  Administered 2016-03-13: 09:00:00 via INTRAVENOUS

## 2016-03-13 MED ORDER — MIDAZOLAM HCL 2 MG/2ML IJ SOLN
INTRAMUSCULAR | Status: AC | PRN
  Administered 2016-03-13: 1 mg via INTRAVENOUS

## 2016-03-13 MED ORDER — FENTANYL CITRATE (PF) 100 MCG/2ML IJ SOLN
INTRAMUSCULAR | Status: AC | PRN
  Administered 2016-03-13: 50 ug via INTRAVENOUS

## 2016-03-13 NOTE — Discharge Instructions (Signed)
Bone Marrow Aspiration and Bone Marrow Biopsy, Adult Bone marrow aspiration and bone marrow biopsy are procedures that are done to diagnose blood disorders. You may also have one of these procedures to help diagnose infections or some types of cancer. Bone marrow is the soft tissue that is inside your bones. Blood cells are produced in bone marrow. For bone marrow aspiration, a sample of tissue in liquid form is removed from inside your bone. For a bone marrow biopsy, a small core of bone marrow tissue is removed. These samples are examined under a microscope or tested in a lab. You may need these procedures if you have an abnormal complete blood count (CBC). The aspiration or biopsy sample is usually taken from the top of your hip bone. Sometimes, an aspiration sample is taken from your chest bone (sternum). Tell a health care provider about:  Any allergies you have.  All medicines you are taking, including vitamins, herbs, eye drops, creams, and over-the-counter medicines.  Any problems you or family members have had with anesthetic medicines.  Any blood or bone disorders you have.  Any surgeries you have had.  Any medical conditions you have.  Whether you are pregnant or you think that you may be pregnant. What are the risks? Generally, this is a safe procedure. However, problems may occur, including:  Infection.  Bleeding.  Persistent pain after the procedure.  Cracking (fracture) of the bone.  Allergic reactions to medicines. What happens before the procedure? Staying hydrated  Follow instructions from your health care provider about hydration, which may include:  Up to 2 hours before the procedure - you may continue to drink clear liquids, such as water, clear fruit juice, black coffee, and plain tea. Eating and drinking restrictions  Follow instructions from your health care provider about eating and drinking, which may include:  8 hours before the procedure - stop  eating heavy meals or foods such as meat, fried foods, or fatty foods.  6 hours before the procedure - stop eating light meals or foods, such as toast or cereal.  6 hours before the procedure - stop drinking milk or drinks that contain milk.  2 hours before the procedure - stop drinking clear liquids. Medicines   Ask your health care provider about:  Changing or stopping your regular medicines. This is especially important if you are taking diabetes medicines or blood thinners.  Taking medicines such as aspirin and ibuprofen. These medicines can thin your blood. Do not take these medicines before your procedure if your health care provider instructs you not to.  You may be given antibiotic medicine to help prevent infection. General instructions   Plan to have someone take you home after the procedure.  If you will be going home right after the procedure, plan to have someone with you for 24 hours.  Ask your health care provider how your surgical site will be marked or identified. What happens during the procedure?  To reduce your risk of infection:  Your health care team will wash or sanitize their hands.  Your skin will be washed with soap.  Hair may be removed from the surgical area.  An IV tube may be inserted into one of your veins.  The injection site will be cleaned with a germ-killing solution (antiseptic).  You will be given one or more of the following:  A medicine to help you relax (sedative).  A medicine to numb the area (local anesthetic).  A medicine to make you fall  asleep (general anesthetic).  The bone marrow sample will be removed as follows:  For an aspiration, a hollow needle will be inserted through your skin and into your bone. Bone marrow fluid will be drawn up into a syringe.  For a biopsy, your health care provider will use a hollow needle to remove a core of tissue from your bone marrow.  The needle will be removed.  A bandage (dressing)  will be placed over the insertion site and taped in place. The procedure may vary among health care providers and hospitals. What happens after the procedure?  Your blood pressure, heart rate, breathing rate, and blood oxygen level will be monitored until the medicines you were given have worn off.  Your IV tube will be removed, and the insertion site will be checked for bleeding.  Do not drive for 24 hours if you were given a sedative. This information is not intended to replace advice given to you by your health care provider. Make sure you discuss any questions you have with your health care provider. Document Released: 12/27/2003 Document Revised: 07/13/2015 Document Reviewed: 06/06/2015 Elsevier Interactive Patient Education  2017 [place]. Bone Marrow Aspiration and Bone Marrow Biopsy, Adult, Care After This sheet gives you information about how to care for yourself after your procedure. Your health care provider may also give you more specific instructions. If you have problems or questions, contact your health care provider. What can I expect after the procedure? After the procedure, it is common to have:  Mild pain and tenderness.  Swelling.  Bruising. Follow these instructions at home:  Take over-the-counter or prescription medicines only as told by your health care provider.  Do not take baths, swim, or use a hot tub until your health care provider approves. Ask if you can take a shower or have a sponge bath.  Follow instructions from your health care provider about how to take care of the puncture site. Make sure you:  Wash your hands with soap and water before you change your bandage (dressing). If soap and water are not available, use hand sanitizer.  Change your dressing as told by your health care provider.  Check your puncture siteevery day for signs of infection. Check for:  More redness, swelling, or pain.  More fluid or blood.  Warmth.  Pus or a bad  smell.  Return to your normal activities as told by your health care provider. Ask your health care provider what activities are safe for you.  Do not drive for 24 hours if you were given a medicine to help you relax (sedative).  Keep all follow-up visits as told by your health care provider. This is important. Contact a health care provider if:  You have more redness, swelling, or pain around the puncture site.  You have more fluid or blood coming from the puncture site.  Your puncture site feels warm to the touch.  You have pus or a bad smell coming from the puncture site.  You have a fever.  Your pain is not controlled with medicine. This information is not intended to replace advice given to you by your health care provider. Make sure you discuss any questions you have with your health care provider. Document Released: 07/12/2004 Document Revised: 07/13/2015 Document Reviewed: 06/06/2015 Elsevier Interactive Patient Education  2017 [place]. Moderate Conscious Sedation, Adult, Care After These instructions provide you with information about caring for yourself after your procedure. Your health care provider may also give you more  specific instructions. Your treatment has been planned according to current medical practices, but problems sometimes occur. Call your health care provider if you have any problems or questions after your procedure. What can I expect after the procedure? After your procedure, it is common:  To feel sleepy for several hours.  To feel clumsy and have poor balance for several hours.  To have poor judgment for several hours.  To vomit if you eat too soon. Follow these instructions at home: For at least 24 hours after the procedure:    Do not:  Participate in activities where you could fall or become injured.  Drive.  Use heavy machinery.  Drink alcohol.  Take sleeping pills or medicines that cause drowsiness.  Make important decisions  or sign legal documents.  Take care of children on your own.  Rest. Eating and drinking   Follow the diet recommended by your health care provider.  If you vomit:  Drink water, juice, or soup when you can drink without vomiting.  Make sure you have little or no nausea before eating solid foods. General instructions   Have a responsible adult stay with you until you are awake and alert.  Take over-the-counter and prescription medicines only as told by your health care provider.  If you smoke, do not smoke without supervision.  Keep all follow-up visits as told by your health care provider. This is important. Contact a health care provider if:  You keep feeling nauseous or you keep vomiting.  You feel light-headed.  You develop a rash.  You have a fever. Get help right away if:  You have trouble breathing. This information is not intended to replace advice given to you by your health care provider. Make sure you discuss any questions you have with your health care provider. Document Released: 10/13/2012 Document Revised: 05/28/2015 Document Reviewed: 04/14/2015 Elsevier Interactive Patient Education  2017 [employer].

## 2016-03-13 NOTE — Patient Instructions (Signed)
-  Hold metformin -discuss with PCP Dr Doreene Nest about education/teaching to start sliding scale insulin that would be needed while on high dose dexamethasone. -we will recommend Dr Doreene Nest set you up for a nephrology followup.

## 2016-03-13 NOTE — Progress Notes (Signed)
[name]    HEMATOLOGY/ONCOLOGY CONSULTATION NOTE  Date of Service: 03/13/2016  Patient Care Team: [name], MD as PCP - General (Family Medicine)  [facility] family medicine ([employer] ) [phone]  CHIEF COMPLAINTS/PURPOSE OF CONSULTATION:  Concern for multiple myeloma  HISTORY OF PRESENTING ILLNESS:   Douglas Rose. is a wonderful 71 y.o. male who has been referred to [name] by Dr .Suzanna Obey, MD And Dr Waymon Budge MD for evaluation and management of likely multiple myeloma.  Patient has a history of hypertension, coronary artery disease status post PCi about 7 yrs ago, dyslipidemia, type 2 diabetes and presented recently to the hospital with 3-4 week history of abdominal pain and diarrhea. Patient was noted to have acute renal failure with a creatinine of 5.1 with a CT scan showing no overt hydronephrosis or intra-abdominal pathology. He was noted to have an enlarged prostate with possible element of bladder outlet obstruction secondary to BPH requiring Foley's catheterization. He was also noted to have normocytic anemia with a hemoglobin of 10.8 and MCV of 84. Patient had good urine output after Foley's catheterization but his creatinine only improved down to 3.7 on discharge. (Baseline creatinine was 1.1 in December 2017) Workup for his kidney failure showed significantly elevated kappa free light chains 858 mg percent, lambda 18.4, with a ratio of 46.65 . He was also noted to have an M spike of 1.7 g/dL with IFE showing IgG kappa monoclonal protein. Quantitative IgG level was increased to nearly 2500 mg/dL. 24-hour UPEP showed monoclonal protein of 1300 mg per 24 hours which constituted about 86% of all the urinary protein.  Patient was referred to [name] for further evaluation and management of his multiple myeloma. He had a bone marrow biopsy this morning the results of which are currently pending.  Patient and his wife had multiple questions about his  lab results and his kidney function which were answered in details.  We discussed that his abdominal pain could have been from lactic acidosis due to metformin use in the setting of new renal failure. We discussed that he should absolutely hold his metformin.  MEDICAL HISTORY:  Past Medical History:  Diagnosis Date  . CAD (coronary artery disease) 2006   stent LAD 3.0x18 Cypher, occluded RCA  . H/O non-insulin dependent diabetes mellitus   . Hyperlipidemia   . Hypertension     SURGICAL HISTORY: Past Surgical History:  Procedure Laterality Date  . CARDIAC CATHETERIZATION  2006    SOCIAL HISTORY: Social History   Social History  . Marital status: Married    Spouse name: N/A  . Number of children: 6  . Years of education: N/A   Occupational History  . retired Company secretary    Social History Main Topics  . Smoking status: Never Smoker  . Smokeless tobacco: Never Used  . Alcohol use No  . Drug use: No  . Sexual activity: Not Currently   Other Topics Concern  . Not on file   Social History Narrative  . No narrative on file    FAMILY HISTORY: Family History  Problem Relation Age of Onset  . Arrhythmia Mother   . Diabetes Sister   . Diabetes Brother   . Diabetes Brother     ALLERGIES:  has No Known Allergies.  MEDICATIONS:  Current Outpatient Prescriptions  Medication Sig Dispense Refill  . amLODipine (NORVASC) 10 MG tablet Take 10 mg by mouth daily.    [name] aspirin 81 MG tablet Take 81 mg by  mouth daily.    [name] atorvastatin (LIPITOR) 20 MG tablet Take 1 tablet (20 mg total) by mouth daily. 90 tablet 3  . glipiZIDE (GLUCOTROL) 5 MG tablet Take 10 mg by mouth every morning.     [name] JANUMET XR [phone] MG TB24 Take 1 tablet by mouth daily.    . metFORMIN (GLUCOPHAGE) 1000 MG tablet Take 500 mg by mouth 2 (two) times daily with a meal.     . metoprolol succinate (TOPROL-XL) 25 MG 24 hr tablet Take 3 tablets (75 mg total) by mouth daily. Take with or immediately following a  meal. 90 tablet 1  . nitroGLYCERIN (NITROSTAT) 0.4 MG SL tablet Place 1 tablet (0.4 mg total) under the tongue every 5 (five) minutes x 3 doses as needed for chest pain. 30 tablet 12  . tamsulosin (FLOMAX) 0.4 MG CAPS capsule Take 1 capsule (0.4 mg total) by mouth daily after supper. 30 capsule 2   No current facility-administered medications for this visit.    Facility-Administered Medications Ordered in Other Visits  Medication Dose Route Frequency Provider Last Rate Last Dose  . 0.9 %  sodium chloride infusion   Intravenous Continuous Darrell K Allred, PA-C   Stopped at 03/13/16 1230  . HYDROcodone-acetaminophen (NORCO/VICODIN) 5-325 MG per tablet 1-2 tablet  1-2 tablet Oral Q4H PRN Markus Daft, MD        REVIEW OF SYSTEMS:    10 Point review of Systems was done is negative except as noted above.  PHYSICAL EXAMINATION: ECOG PERFORMANCE STATUS: 1 - Symptomatic but completely ambulatory  . Vitals:   03/13/16 1256  BP: (!) 184/94  Pulse: 79  Resp: 18  Temp: 97.8 F (36.6 C)   Filed Weights   03/13/16 1256  Weight: 161 lb 6 oz (73.2 kg)   .Body mass index is 22.51 kg/m.  GENERAL:alert, in no acute distress and comfortable SKIN: skin color, texture, turgor are normal, no rashes or significant lesions EYES: normal, conjunctiva are pink and non-injected, sclera clear OROPHARYNX:no exudate, no erythema and lips, buccal mucosa, and tongue normal  NECK: supple, no JVD, thyroid normal size, non-tender, without nodularity LYMPH:  no palpable lymphadenopathy in the cervical, axillary or inguinal LUNGS: clear to auscultation with normal respiratory effort HEART: regular rate & rhythm,  no murmurs and no lower extremity edema ABDOMEN: abdomen soft, non-tender, normoactive bowel sounds  Musculoskeletal: no cyanosis of digits and no clubbing  PSYCH: alert & oriented x 3 with fluent speech NEURO: no focal motor/sensory deficits  LABORATORY DATA:  I have reviewed the data as  listed  . CBC Latest Ref Rng & Units 03/13/2016 03/06/2016 03/02/2016  WBC 4.0 - 10.5 K/uL 5.6 5.3 5.4  Hemoglobin 13.0 - 17.0 g/dL 10.7(L) 11.4(L) 10.3(L)  Hematocrit 39.0 - 52.0 % 32.3(L) 34.7(L) 31.2(L)  Platelets 150 - 400 K/uL 224 217 242    . CMP Latest Ref Rng & Units 03/13/2016 03/06/2016 03/04/2016  Glucose 65 - 99 mg/dL 118(H) 169(H) 117(H)  BUN 6 - 20 mg/dL 25(H) 22(H) 25(H)  Creatinine 0.61 - 1.24 mg/dL 3.59(H) 3.61(H) 3.71(H)  Sodium 135 - 145 mmol/L 140 139 143  Potassium 3.5 - 5.1 mmol/L 3.8 3.8 3.7  Chloride 101 - 111 mmol/L 108 107 113(H)  CO2 22 - 32 mmol/L 25 24 24   Calcium 8.9 - 10.3 mg/dL 9.4 9.5 8.7(L)  Total Protein 6.5 - 8.1 g/dL - 8.8(H) -  Total Bilirubin 0.3 - 1.2 mg/dL - 0.6 -  Alkaline Phos 38 - 126 U/L -  67 -  AST 15 - 41 U/L - 113(H) -  ALT 17 - 63 U/L - 81(H) -   Component     Latest Ref Rng & Units 03/03/2016 03/03/2016 03/06/2016         6:16 PM  6:16 PM   Total Protein ELP     6.0 - 8.5 g/dL 7.5 7.7   Albumin ELP     2.9 - 4.4 g/dL  3.1   Alpha-1-Globulin     0.0 - 0.4 g/dL  0.3   Alpha-2-Globulin     0.4 - 1.0 g/dL  0.9   Beta Globulin     0.7 - 1.3 g/dL  0.9   Gamma Globulin     0.4 - 1.8 g/dL  2.4 (H)   M-SPIKE, %     Not Observed g/dL  1.7 (H)   SPE Interp.       Comment   Comment       Comment   Globulin, Total     2.2 - 3.9 g/dL  4.6 (H)   A/G Ratio     0.7 - 1.7  0.7   IgG (Immunoglobin G), Serum     700 - 1,600 mg/dL 2,499 (H)    IgA     61 - 437 mg/dL 107    IgM, Serum     15 - 143 mg/dL 31    Immunofixation Result, Serum      Comment    Kappa free light chain     3.3 - 19.4 mg/L 858.4 (H)    Lamda free light chains     5.7 - 26.3 mg/L 18.4    Kappa, lamda light chain ratio     0.26 - 1.65 46.65 (H)    LDH     98 - 192 U/L   253 (H)  Beta-2 Microglobulin     0.6 - 2.4 mg/L   4.4 (H)   Immunofixation electrophoresis      The value has a corrected status.      No reference range information available       Comments:           (NOTE)           Immunofixation shows IgG monoclonal protein with kappa light           chain           Specificity.    RADIOGRAPHIC STUDIES: I have personally reviewed the radiological images as listed and agreed with the findings in the report. Ct Abdomen Pelvis Wo Contrast  Result Date: 03/02/2016 CLINICAL DATA:  Upper abdominal pain, difficulty urinating EXAM: CT ABDOMEN AND PELVIS WITHOUT CONTRAST TECHNIQUE: Multidetector CT imaging of the abdomen and pelvis was performed following the standard protocol without IV contrast. COMPARISON:  None. FINDINGS: Lower chest: No significant pleural effusion. 2 mm pulmonary nodule anterior left lung base. Borderline cardiomegaly. Small esophageal hiatal hernia. Hepatobiliary: No focal liver abnormality is seen. No gallstones, gallbladder wall thickening, or biliary dilatation. Pancreas: Mildly indistinct appearance of the neck and body of pancreas with suggestion of mild surrounding soft tissue stranding. Spleen: Normal in size without focal abnormality. Adrenals/Urinary Tract: Adrenal glands are within normal limits. Nonspecific bilateral perinephric fat stranding. No hydronephrosis. Thick-walled urinary bladder with small air and a Foley catheter Stomach/Bowel: Stomach is within normal limits. Appendix appears normal. No evidence of bowel wall thickening, distention, or inflammatory changes. Vascular/Lymphatic: Aortic atherosclerosis. No enlarged abdominal or pelvic lymph nodes. Reproductive: Enlarged prostate  gland. Other: No significant free air or free fluid. Musculoskeletal: No acute or suspicious bone lesions. IMPRESSION: 1. Indistinct appearance of the neck and body of pancreas with suggestion of mild inflammatory change, findings may relate to a mild pancreatitis, suggest correlation with laboratory values. 2. Nonspecific bilateral perinephric fat stranding. No ureteral stones. No hydronephrosis. Thick-walled urinary bladder could  be secondary to cystitis or chronic obstruction. 3. Enlarged prostate gland 4. 2 mm left anterior lung base pulmonary nodule. No follow-up needed if patient is low-risk. Non-contrast chest CT can be considered in 12 months if patient is high-risk. This recommendation follows the consensus statement: Guidelines for Management of Incidental Pulmonary Nodules Detected on CT Images: From the Fleischner Society 2017; Radiology 2017; 284:228-243. Electronically Signed   By: Donavan Foil M.D.   On: 03/02/2016 01:14   US Renal  Result Date: 03/02/2016 CLINICAL DATA:  Acute kidney injury. Patient admitted with pancreatitis 03/02/2016. 3.5 week history of abdominal pain. EXAM: RENAL / URINARY TRACT ULTRASOUND COMPLETE COMPARISON:  CT abdomen and pelvis 03/02/2016. FINDINGS: Right Kidney: Length: 11.0. Echogenicity within normal limits. No mass or hydronephrosis visualized. Left Kidney: Length: 11.6. Echogenicity within normal limits. No mass or hydronephrosis visualized. Bladder: Completely decompressed with a Foley catheter in place. IMPRESSION: Negative exam. Electronically Signed   By: Inge Rise M.D.   On: 03/02/2016 11:57   Dg Bone Survey Met  Result Date: 03/07/2016 CLINICAL DATA:  Monoclonal gammopathy.  Rule out myeloma. EXAM: METASTATIC BONE SURVEY COMPARISON:  None. FINDINGS: No lytic lesions of myeloma identified on skeletal survey. Negative for fracture. Cervical spondylosis with prominent spurring at C5-6 and C6-7. No acute abnormality in the chest. IMPRESSION: No acute skeletal abnormality.  No evidence of myeloma. Electronically Signed   By: Franchot Gallo M.D.   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 Anemia + Renal insufficiency. Bone survey neg for concerning bone lesions. significantly elevated kappa free light  chains 858 mg percent, lambda 18.4, with a ratio of 46.65 . He was also noted to have an M spike of 1.7 g/dL with IFE showing IgG kappa monoclonal protein. Quantitative IgG level was increased to nearly 2500 mg/dL. 24-hour UPEP showed monoclonal protein of 1300 mg per 24 hours which constituted about 86% of all the urinary protein.  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 Had an element of obstructive uropathy due to BPH which has resolved. Creatinine still at about 3.5.  3) normocytic anemia likely related to multiple myeloma. Plan -I discussed with the patient and his wife the likely diagnosis of multiple myeloma, ending workup including bone marrow biopsy results, prognosis and likely treatment options. -Patient had a bone marrow biopsy this morning the results of which should be available in the next 3-4 days. -I discussed with the patient that he needs to continue following up with his primary care physician to optimize his other medical comorbidities -He will also need to discuss a plan for treatment of his diabetes and consideration of sliding scale insulin since he will likely be on high-dose steroids which will cause hyperglycemia and uncontrolled diabetes. -He will also need a nephrology referral for management of myeloma kidney alongside [name]. -No indication for PRBC transfusion at this time.  4)  Patient Active Problem List   Diagnosis Date Noted  . Multiple myeloma (  [place]) 03/20/2016  . Acute renal failure ([place]) 03/06/2016  . Monoclonal gammopathy   . Protein-calorie malnutrition, severe 03/03/2016  . Pancreatitis 03/02/2016  . Enlarged prostate 03/02/2016  . Upper abdominal pain   . Urinary retention   . Acute renal failure (ARF) ([place]) 03/01/2016  . Abdominal fullness 02/26/2016  . Lymphadenopathy 11/21/2015  . Lipoma 07/19/2014  . Coronary artery disease   . Hyperlipidemia   . Hypertension   . H/O non-insulin dependent  diabetes mellitus    Plan -Continue follow-up with primary care physician for management of other medical comorbidities. --Hold metformin -discuss with PCP Dr Doreene Nest about education/teaching to start sliding scale insulin that would be needed while on high dose dexamethasone. -we will recommend Dr Doreene Nest set you up for a nephrology followu  RTC with Dr Irene Limbo in about 1 week with results of BM Bx to determine specific treatment options  All of the patients questions were answered with apparent satisfaction. The patient knows to call the clinic with any problems, questions or concerns.  I spent 60 minutes counseling the patient face to face. The total time spent in the appointment was 80 minutes and more than 50% was on counseling and direct patient cares.    Sullivan Lone MD [place] AAHIVMS [facility] Hematology/Oncology Physician [facility]  (Office):       [phone] (Work cell):  [phone] (Fax):           [phone]  03/13/2016 1:18 PM

## 2016-03-13 NOTE — Progress Notes (Signed)
Patient transported to [facility] via wheelchair with no issues noted.  Patient companied with wife for appointment with [name].

## 2016-03-13 NOTE — Consult Note (Signed)
Chief Complaint: Patient was seen in consultation today for CT-guided bone marrow biopsy  Referring Physician(s): Granfortuna,James M  Supervising Physician: Markus Daft  Patient Status: [facility] - Out-pt  History of Present Illness: Douglas Rose. is a 71 y.o. male with past medical history as listed below as well as recently noted acute renal failure and MGUS/abnormal SPEP/UPEP who presents today for CT-guided bone marrow biopsy for further evaluation.  Past Medical History:  Diagnosis Date  . CAD (coronary artery disease) 2006   stent LAD 3.0x18 [place], occluded RCA  . H/O non-insulin dependent diabetes mellitus   . Hyperlipidemia   . Hypertension     Past Surgical History:  Procedure Laterality Date  . CARDIAC CATHETERIZATION  2006    Allergies: Patient has no known allergies.  Medications: Prior to Admission medications   Medication Sig Start Date End Date Taking? Authorizing Provider  amLODipine (NORVASC) 10 MG tablet Take 10 mg by mouth daily.   Yes Historical Provider, MD  aspirin 81 MG tablet Take 81 mg by mouth daily.   Yes Historical Provider, MD  atorvastatin (LIPITOR) 20 MG tablet Take 1 tablet (20 mg total) by mouth daily. 12/06/12  Yes Burtis Junes, NP  glipiZIDE (GLUCOTROL) 5 MG tablet Take 10 mg by mouth every morning.  10/25/12  Yes Historical Provider, MD  JANUMET XR [phone] MG TB24 Take 1 tablet by mouth daily. 02/02/16  Yes Historical Provider, MD  metFORMIN (GLUCOPHAGE) 1000 MG tablet Take 500 mg by mouth 2 (two) times daily with a meal.    Yes Historical Provider, MD  metoprolol succinate (TOPROL-XL) 25 MG 24 hr tablet Take 3 tablets (75 mg total) by mouth daily. Take with or immediately following a meal. 03/05/16  Yes Velna Ochs, MD  tamsulosin (FLOMAX) 0.4 MG CAPS capsule Take 1 capsule (0.4 mg total) by mouth daily after supper. 03/04/16  Yes Velna Ochs, MD  nitroGLYCERIN (NITROSTAT) 0.4 MG SL tablet Place 1 tablet (0.4 mg total)  under the tongue every 5 (five) minutes x 3 doses as needed for chest pain. 11/13/12   Azzie Roup Edmisten, PA-C     Family History  Problem Relation Age of Onset  . Arrhythmia Mother   . Diabetes Sister   . Diabetes Brother   . Diabetes Brother     Social History   Social History  . Marital status: Married    Spouse name: N/A  . Number of children: 6  . Years of education: N/A   Occupational History  . retired Company secretary    Social History Main Topics  . Smoking status: Never Smoker  . Smokeless tobacco: Never Used  . Alcohol use No  . Drug use: No  . Sexual activity: Not Currently   Other Topics Concern  . None   Social History Narrative  . None      Review of Systems denies fever, headache, chest pain, dyspnea, cough, abdominal pain, nausea, vomiting abnormal bleeding. He does have a broken as well as loose tooth on the right.  Vital Signs: Blood pressure 169/92, temp 98.2, heart rate 83, respirations 18, O2 sat 100% room air  Physical Exam awake, alert. Chest clear to auscultation bilaterally. Heart with regular rate and rhythm. Abdomen soft, positive bowel sounds, nontender. Lower extremities with no edema.  Mallampati Score:     Imaging: Ct Abdomen Pelvis Wo Contrast  Result Date: 03/02/2016 CLINICAL DATA:  Upper abdominal pain, difficulty urinating EXAM: CT ABDOMEN AND PELVIS WITHOUT CONTRAST TECHNIQUE: Multidetector  CT imaging of the abdomen and pelvis was performed following the standard protocol without IV contrast. COMPARISON:  None. FINDINGS: Lower chest: No significant pleural effusion. 2 mm pulmonary nodule anterior left lung base. Borderline cardiomegaly. Small esophageal hiatal hernia. Hepatobiliary: No focal liver abnormality is seen. No gallstones, gallbladder wall thickening, or biliary dilatation. Pancreas: Mildly indistinct appearance of the neck and body of pancreas with suggestion of mild surrounding soft tissue stranding. Spleen: Normal in size  without focal abnormality. Adrenals/Urinary Tract: Adrenal glands are within normal limits. Nonspecific bilateral perinephric fat stranding. No hydronephrosis. Thick-walled urinary bladder with small air and a Foley catheter Stomach/Bowel: Stomach is within normal limits. Appendix appears normal. No evidence of bowel wall thickening, distention, or inflammatory changes. Vascular/Lymphatic: Aortic atherosclerosis. No enlarged abdominal or pelvic lymph nodes. Reproductive: Enlarged prostate gland. Other: No significant free air or free fluid. Musculoskeletal: No acute or suspicious bone lesions. IMPRESSION: 1. Indistinct appearance of the neck and body of pancreas with suggestion of mild inflammatory change, findings may relate to a mild pancreatitis, suggest correlation with laboratory values. 2. Nonspecific bilateral perinephric fat stranding. No ureteral stones. No hydronephrosis. Thick-walled urinary bladder could be secondary to cystitis or chronic obstruction. 3. Enlarged prostate gland 4. 2 mm left anterior lung base pulmonary nodule. No follow-up needed if patient is low-risk. Non-contrast chest CT can be considered in 12 months if patient is high-risk. This recommendation follows the consensus statement: Guidelines for Management of Incidental Pulmonary Nodules Detected on CT Images: From the Fleischner Society 2017; Radiology 2017; 284:228-243. Electronically Signed   By: Donavan Foil M.D.   On: 03/02/2016 01:14   US Renal  Result Date: 03/02/2016 CLINICAL DATA:  Acute kidney injury. Patient admitted with pancreatitis 03/02/2016. 3.5 week history of abdominal pain. EXAM: RENAL / URINARY TRACT ULTRASOUND COMPLETE COMPARISON:  CT abdomen and pelvis 03/02/2016. FINDINGS: Right Kidney: Length: 11.0. Echogenicity within normal limits. No mass or hydronephrosis visualized. Left Kidney: Length: 11.6. Echogenicity within normal limits. No mass or hydronephrosis visualized. Bladder: Completely decompressed with  a Foley catheter in place. IMPRESSION: Negative exam. Electronically Signed   By: Inge Rise M.D.   On: 03/02/2016 11:57   Dg Bone Survey Met  Result Date: 03/07/2016 CLINICAL DATA:  Monoclonal gammopathy.  Rule out myeloma. EXAM: METASTATIC BONE SURVEY COMPARISON:  None. FINDINGS: No lytic lesions of myeloma identified on skeletal survey. Negative for fracture. Cervical spondylosis with prominent spurring at C5-6 and C6-7. No acute abnormality in the chest. IMPRESSION: No acute skeletal abnormality.  No evidence of myeloma. Electronically Signed   By: Franchot Gallo M.D.   On: 03/07/2016 08:17    Labs:  CBC:  Recent Labs  03/01/16 2058 03/02/16 0724 03/06/16 1445  WBC 5.1 5.4 5.3  HGB 10.8* 10.3* 11.4*  HCT 32.6* 31.2* 34.7*  PLT 252 242 217    COAGS: No results for input(s): INR, APTT in the last 8760 hours.  BMP:  Recent Labs  03/02/16 0724 03/03/16 0835 03/04/16 0612 03/06/16 1445  NA 136 138 143 139  K 3.9 3.9 3.7 3.8  CL 106 108 113* 107  CO2 22 22 24 24   GLUCOSE 141* 185* 117* 169*  BUN 33* 29* 25* 22*  CALCIUM 8.7* 8.5* 8.7* 9.5  CREATININE 4.37* 4.14* 3.71* 3.61*  GFRNONAA 12* 13* 15* 15*  GFRAA 14* 15* 17* 18*    LIVER FUNCTION TESTS:  Recent Labs  03/01/16 2058 03/06/16 1445  BILITOT 0.3 0.6  AST 14* 113*  ALT 9* 81*  ALKPHOS 49 67  PROT 7.9 8.8*  ALBUMIN 3.4* 3.6    TUMOR MARKERS: No results for input(s): AFPTM, CEA, CA199, CHROMGRNA in the last 8760 hours.  Assessment and Plan: 71 y.o. male with hx CAD/prior stenting, DM,HTN,HLD as well as recently noted acute renal failure and MGUS/abnormal SPEP/UPEP who presents today for CT-guided bone marrow biopsy for further evaluation.Risks and benefits discussed with the patient/spouse including, but not limited to bleeding, infection, damage to adjacent structures or low yield requiring additional tests. All of the patient's questions were answered, patient is agreeable to proceed. Consent  signed and in chart.      Thank you for this interesting consult.  I greatly enjoyed meeting Rishaan Gunner. and look forward to participating in their care.  A copy of this report was sent to the requesting provider on this date.  Electronically Signed: D. Rowe Robert 03/13/2016, 8:53 AM   I spent a total of  20 minutes  in face to face in clinical consultation, greater than 50% of which was counseling/coordinating care for CT-guided bone marrow biopsy

## 2016-03-13 NOTE — Discharge Instructions (Signed)
Bone Marrow Aspiration and Bone Marrow Biopsy, Adult, Care After °This sheet gives you information about how to care for yourself after your procedure. Your health care provider may also give you more specific instructions. If you have problems or questions, contact your health care provider. °What can I expect after the procedure? °After the procedure, it is common to have: °· Mild pain and tenderness. °· Swelling. °· Bruising. °Follow these instructions at home: °· Take over-the-counter or prescription medicines only as told by your health care provider. °· Do not take baths, swim, or use a hot tub until your health care provider approves. Ask if you can take a shower or have a sponge bath. °· Follow instructions from your health care provider about how to take care of the puncture site. Make sure you: °¨ Wash your hands with soap and water before you change your bandage (dressing). If soap and water are not available, use hand sanitizer. °¨ Change your dressing as told by your health care provider. °· Check your puncture site every day for signs of infection. Check for: °¨ More redness, swelling, or pain. °¨ More fluid or blood. °¨ Warmth. °¨ Pus or a bad smell. °· Return to your normal activities as told by your health care provider. Ask your health care provider what activities are safe for you. °· Do not drive for 24 hours if you were given a medicine to help you relax (sedative). °· Keep all follow-up visits as told by your health care provider. This is important. °Contact a health care provider if: °· You have more redness, swelling, or pain around the puncture site. °· You have more fluid or blood coming from the puncture site. °· Your puncture site feels warm to the touch. °· You have pus or a bad smell coming from the puncture site. °· You have a fever. °· Your pain is not controlled with medicine. °This information is not intended to replace advice given to you by your health care provider. Make sure you  discuss any questions you have with your health care provider. °Document Released: 07/12/2004 Document Revised: 07/13/2015 Document Reviewed: 06/06/2015 °Elsevier Interactive Patient Education © 2017 [employer] °Moderate Conscious Sedation, Adult, Care After °These instructions provide you with information about caring for yourself after your procedure. Your health care provider may also give you more specific instructions. Your treatment has been planned according to current medical practices, but problems sometimes occur. Call your health care provider if you have any problems or questions after your procedure. °What can I expect after the procedure? °After your procedure, it is common: °· To feel sleepy for several hours. °· To feel clumsy and have poor balance for several hours. °· To have poor judgment for several hours. °· To vomit if you eat too soon. °Follow these instructions at home: °For at least 24 hours after the procedure:  ° °· Do not: °¨ Participate in activities where you could fall or become injured. °¨ Drive. °¨ Use heavy machinery. °¨ Drink alcohol. °¨ Take sleeping pills or medicines that cause drowsiness. °¨ Make important decisions or sign legal documents. °¨ Take care of children on your own. °· Rest. °Eating and drinking  °· Follow the diet recommended by your health care provider. °· If you vomit: °¨ Drink water, juice, or soup when you can drink without vomiting. °¨ Make sure you have little or no nausea before eating solid foods. °General instructions  °· Have a responsible adult stay with you until you   until you are awake and alert.  Take over-the-counter and prescription medicines only as told by your health care provider.  If you smoke, do not smoke without supervision.  Keep all follow-up visits as told by your health care provider. This is important. Contact a health care provider if:  You keep feeling nauseous or you keep vomiting.  You feel light-headed.  You develop a  rash.  You have a fever. Get help right away if:  You have trouble breathing. This information is not intended to replace advice given to you by your health care provider. Make sure you discuss any questions you have with your health care provider. Document Released: 10/13/2012 Document Revised: 05/28/2015 Document Reviewed: 04/14/2015 Elsevier Interactive Patient Education  2017 [employer].

## 2016-03-13 NOTE — Procedures (Signed)
  Pre-operative Diagnosis: Monoclonal gammopathy       Post-operative Diagnosis: Monoclonal gammopathy  Indications: Bone marrow is part of workup  Procedure: CT guided bone marrow biopsy  Findings: 2 aspirates and 1 core from right ilium  Complications: None     EBL: Minimal    Plan: Bedrest 1 hour.

## 2016-03-17 ENCOUNTER — Ambulatory Visit (HOSPITAL_COMMUNITY): Payer: Medicare HMO

## 2016-03-18 ENCOUNTER — Other Ambulatory Visit: Payer: Self-pay | Admitting: *Deleted

## 2016-03-18 DIAGNOSIS — D472 Monoclonal gammopathy: Secondary | ICD-10-CM

## 2016-03-20 ENCOUNTER — Other Ambulatory Visit (HOSPITAL_BASED_OUTPATIENT_CLINIC_OR_DEPARTMENT_OTHER): Payer: Medicare HMO

## 2016-03-20 ENCOUNTER — Ambulatory Visit (HOSPITAL_BASED_OUTPATIENT_CLINIC_OR_DEPARTMENT_OTHER): Payer: Medicare HMO | Admitting: Hematology

## 2016-03-20 ENCOUNTER — Encounter: Payer: Self-pay | Admitting: Hematology

## 2016-03-20 VITALS — BP 172/84 | HR 78 | Temp 97.9°F | Resp 18 | Wt 162.1 lb

## 2016-03-20 DIAGNOSIS — C9000 Multiple myeloma not having achieved remission: Secondary | ICD-10-CM | POA: Diagnosis not present

## 2016-03-20 DIAGNOSIS — D472 Monoclonal gammopathy: Secondary | ICD-10-CM | POA: Diagnosis not present

## 2016-03-20 DIAGNOSIS — N179 Acute kidney failure, unspecified: Secondary | ICD-10-CM

## 2016-03-20 DIAGNOSIS — I10 Essential (primary) hypertension: Secondary | ICD-10-CM

## 2016-03-20 DIAGNOSIS — D649 Anemia, unspecified: Secondary | ICD-10-CM | POA: Diagnosis not present

## 2016-03-20 DIAGNOSIS — N08 Glomerular disorders in diseases classified elsewhere: Secondary | ICD-10-CM

## 2016-03-20 HISTORY — DX: Multiple myeloma not having achieved remission: C90.00

## 2016-03-20 LAB — COMPREHENSIVE METABOLIC PANEL
ALK PHOS: 82 U/L (ref 40–150)
ALT: 43 U/L (ref 0–55)
ANION GAP: 9 meq/L (ref 3–11)
AST: 21 U/L (ref 5–34)
Albumin: 3.5 g/dL (ref 3.5–5.0)
BUN: 20 mg/dL (ref 7.0–26.0)
CALCIUM: 9.3 mg/dL (ref 8.4–10.4)
CHLORIDE: 106 meq/L (ref 98–109)
CO2: 24 mEq/L (ref 22–29)
Creatinine: 2.9 mg/dL — ABNORMAL HIGH (ref 0.7–1.3)
EGFR: 24 mL/min/{1.73_m2} — AB (ref >90)
Glucose: 164 mg/dl — ABNORMAL HIGH (ref 70–140)
POTASSIUM: 3.9 meq/L (ref 3.5–5.1)
SODIUM: 139 meq/L (ref 136–145)
Total Bilirubin: 0.3 mg/dL (ref 0.20–1.20)
Total Protein: 8.6 g/dL — ABNORMAL HIGH (ref 6.4–8.3)

## 2016-03-20 LAB — CBC WITH DIFFERENTIAL/PLATELET
BASO%: 0.4 % (ref 0.0–2.0)
Basophils Absolute: 0 10*3/uL (ref 0.0–0.1)
EOS ABS: 0.1 10*3/uL (ref 0.0–0.5)
EOS%: 1.6 % (ref 0.0–7.0)
HCT: 32.6 % — ABNORMAL LOW (ref 38.4–49.9)
HGB: 10.7 g/dL — ABNORMAL LOW (ref 13.0–17.1)
LYMPH%: 39 % (ref 14.0–49.0)
MCH: 27.8 pg (ref 27.2–33.4)
MCHC: 32.8 g/dL (ref 32.0–36.0)
MCV: 84.6 fL (ref 79.3–98.0)
MONO#: 0.7 10*3/uL (ref 0.1–0.9)
MONO%: 12.3 % (ref 0.0–14.0)
NEUT%: 46.7 % (ref 39.0–75.0)
NEUTROS ABS: 2.5 10*3/uL (ref 1.5–6.5)
Platelets: 199 10*3/uL (ref 140–400)
RBC: 3.86 10*6/uL — ABNORMAL LOW (ref 4.20–5.82)
RDW: 14.5 % (ref 11.0–14.6)
WBC: 5.3 10*3/uL (ref 4.0–10.3)
lymph#: 2.1 10*3/uL (ref 0.9–3.3)

## 2016-03-20 NOTE — Progress Notes (Signed)
START ON PATHWAY REGIMEN - Multiple Myeloma     A cycle is every 21 days:     Bortezomib      Cyclophosphamide      Dexamethasone   **Always confirm dose/schedule in your pharmacy ordering system**    Patient Characteristics: Newly Diagnosed, Transplant Eligible, High Risk R-ISS Staging: II Disease Classification: Newly Diagnosed Is Patient Eligible for Transplant? Transplant Eligible Risk Status: High Risk  Intent of Therapy: Non-Curative / Palliative Intent, Discussed with Patient

## 2016-03-24 LAB — CHROMOSOME ANALYSIS, BONE MARROW

## 2016-03-24 LAB — TISSUE HYBRIDIZATION (BONE MARROW)-NCBH

## 2016-03-24 NOTE — Progress Notes (Signed)
[facility]  HEMATOLOGY ONCOLOGY PROGRESS NOTE  Date of service: .03/20/2016  Patient Care Team: [name], MD as PCP - General (Family Medicine)  CC: f/u for multiple myeloma  Diagnosis:  IgG Kappa multiple myeloma - newly diagnosed  Current Treatment: Planned to start treatment with CyBorD  INTERVAL HISTORY: Patient and his wife are here for follow-up on the bone marrow results. Bone marrow biopsy shows 20-30% Restricted plasma cells consistent with multiple myeloma. Results were discussed in details, printed information regarding multiple myeloma diagnosis and treatment were provided to the patient. We discussed treatment options including the recommendation to use CyBorD. We discussed the patient needs to follow up with his primary care physician to optimize diabetes management and get education for sliding scale insulin.  REVIEW OF SYSTEMS:    10 Point review of systems of done and is negative except as noted above.  . Past Medical History:  Diagnosis Date  . CAD (coronary artery disease) 2006   stent LAD 3.0x18 [place], occluded RCA  . H/O non-insulin dependent diabetes mellitus   . Hyperlipidemia   . Hypertension     . Past Surgical History:  Procedure Laterality Date  . CARDIAC CATHETERIZATION  2006    . Social History  Substance Use Topics  . Smoking status: Never Smoker  . Smokeless tobacco: Never Used  . Alcohol use No    ALLERGIES:  has No Known Allergies.  MEDICATIONS:  Current Outpatient Prescriptions  Medication Sig Dispense Refill  . amLODipine (NORVASC) 10 MG tablet Take 10 mg by mouth daily.    [facility] aspirin 81 MG tablet Take 81 mg by mouth daily.    [facility] atorvastatin (LIPITOR) 20 MG tablet Take 1 tablet (20 mg total) by mouth daily. 90 tablet 3  . glipiZIDE (GLUCOTROL) 5 MG tablet Take 10 mg by mouth every morning.     . metoprolol succinate (TOPROL-XL) 25 MG 24 hr tablet Take 3 tablets (75 mg total) by mouth daily. Take with or immediately following a  meal. 90 tablet 1  . nitroGLYCERIN (NITROSTAT) 0.4 MG SL tablet Place 1 tablet (0.4 mg total) under the tongue every 5 (five) minutes x 3 doses as needed for chest pain. 30 tablet 12  . tamsulosin (FLOMAX) 0.4 MG CAPS capsule Take 1 capsule (0.4 mg total) by mouth daily after supper. 30 capsule 2   No current facility-administered medications for this visit.     PHYSICAL EXAMINATION: ECOG PERFORMANCE STATUS: 1 - Symptomatic but completely ambulatory  . Vitals:   03/20/16 1009  BP: (!) 172/84  Pulse: 78  Resp: 18  Temp: 97.9 F (36.6 C)    Filed Weights   03/20/16 1009  Weight: 162 lb 1.6 oz (73.5 kg)   .Body mass index is 22.61 kg/m.  GENERAL:alert, in no acute distress and comfortable SKIN: no acute rashes, no significant lesions EYES: conjunctiva are pink and non-injected, sclera anicteric OROPHARYNX: MMM, no exudates, no oropharyngeal erythema or ulceration NECK: supple, no JVD LYMPH:  no palpable lymphadenopathy in the cervical, axillary or inguinal regions LUNGS: clear to auscultation b/l with normal respiratory effort HEART: regular rate & rhythm ABDOMEN:  normoactive bowel sounds , non tender, not distended. Extremity: no pedal edema PSYCH: alert & oriented x 3 with fluent speech NEURO: no focal motor/sensory deficits  LABORATORY DATA:   I have reviewed the data as listed  . CBC Latest Ref Rng & Units 03/20/2016 03/13/2016 03/06/2016  WBC 4.0 - 10.3 10e3/uL 5.3 5.6 5.3  Hemoglobin 13.0 -  17.1 g/dL 10.7(L) 10.7(L) 11.4(L)  Hematocrit 38.4 - 49.9 % 32.6(L) 32.3(L) 34.7(L)  Platelets 140 - 400 10e3/uL 199 224 217    . CMP Latest Ref Rng & Units 03/20/2016 03/13/2016 03/06/2016  Glucose 70 - 140 mg/dl 164(H) 118(H) 169(H)  BUN 7.0 - 26.0 mg/dL 20.0 25(H) 22(H)  Creatinine 0.7 - 1.3 mg/dL 2.9(H) 3.59(H) 3.61(H)  Sodium 136 - 145 mEq/L 139 140 139  Potassium 3.5 - 5.1 mEq/L 3.9 3.8 3.8  Chloride 101 - 111 mmol/L - 108 107  CO2 22 - 29 mEq/L _0 Calcium 8.4 -  10.4 mg/dL 9.3 9.4 9.5  Total Protein 6.4 - 8.3 g/dL 8.6(H) - 8.8(H)  Total Bilirubin 0.20 - 1.20 mg/dL 0.30 - 0.6  Alkaline Phos 40 - 150 U/L 82 - 67  AST 5 - 34 U/L 21 - 113(H)  ALT 0 - 55 U/L 43 - 81(H)     RADIOGRAPHIC STUDIES: I have personally reviewed the radiological images as listed and agreed with the findings in the report. Ct Abdomen Pelvis Wo Contrast  Result Date: 03/02/2016 CLINICAL DATA:  Upper abdominal pain, difficulty urinating EXAM: CT ABDOMEN AND PELVIS WITHOUT CONTRAST TECHNIQUE: Multidetector CT imaging of the abdomen and pelvis was performed following the standard protocol without IV contrast. COMPARISON:  None. FINDINGS: Lower chest: No significant pleural effusion. 2 mm pulmonary nodule anterior left lung base. Borderline cardiomegaly. Small esophageal hiatal hernia. Hepatobiliary: No focal liver abnormality is seen. No gallstones, gallbladder wall thickening, or biliary dilatation. Pancreas: Mildly indistinct appearance of the neck and body of pancreas with suggestion of mild surrounding soft tissue stranding. Spleen: Normal in size without focal abnormality. Adrenals/Urinary Tract: Adrenal glands are within normal limits. Nonspecific bilateral perinephric fat stranding. No hydronephrosis. Thick-walled urinary bladder with small air and a Foley catheter Stomach/Bowel: Stomach is within normal limits. Appendix appears normal. No evidence of bowel wall thickening, distention, or inflammatory changes. Vascular/Lymphatic: Aortic atherosclerosis. No enlarged abdominal or pelvic lymph nodes. Reproductive: Enlarged prostate gland. Other: No significant free air or free fluid. Musculoskeletal: No acute or suspicious bone lesions. IMPRESSION: 1. Indistinct appearance of the neck and body of pancreas with suggestion of mild inflammatory change, findings may relate to a mild pancreatitis, suggest correlation with laboratory values. 2. Nonspecific bilateral perinephric fat stranding. No  ureteral stones. No hydronephrosis. Thick-walled urinary bladder could be secondary to cystitis or chronic obstruction. 3. Enlarged prostate gland 4. 2 mm left anterior lung base pulmonary nodule. No follow-up needed if patient is low-risk. Non-contrast chest CT can be considered in 12 months if patient is high-risk. This recommendation follows the consensus statement: Guidelines for Management of Incidental Pulmonary Nodules Detected on CT Images: From the Fleischner Society 2017; Radiology 2017; 284:228-243. Electronically Signed   By: Donavan Foil M.D.   On: 03/02/2016 01:14   US Renal  Result Date: 03/02/2016 CLINICAL DATA:  Acute kidney injury. Patient admitted with pancreatitis 03/02/2016. 3.5 week history of abdominal pain. EXAM: RENAL / URINARY TRACT ULTRASOUND COMPLETE COMPARISON:  CT abdomen and pelvis 03/02/2016. FINDINGS: Right Kidney: Length: 11.0. Echogenicity within normal limits. No mass or hydronephrosis visualized. Left Kidney: Length: 11.6. Echogenicity within normal limits. No mass or hydronephrosis visualized. Bladder: Completely decompressed with a Foley catheter in place. IMPRESSION: Negative exam. Electronically Signed   By: Inge Rise M.D.   On: 03/02/2016 11:57   Ct Biopsy  Result Date: 03/13/2016 INDICATION: 71 year old with monoclonal gammopathy. EXAM: CT GUIDED BONE MARROW ASPIRATES AND BIOPSY Physician: Stephan Minister.  Anselm Pancoast, MD MEDICATIONS: None. ANESTHESIA/SEDATION: Fentanyl 50 mcg IV; Versed 2.0 mg IV Moderate Sedation Time:  13 The patient was continuously monitored during the procedure by the interventional radiology nurse under my direct supervision. COMPLICATIONS: None immediate. PROCEDURE: The procedure was explained to the patient. The risks and benefits of the procedure were discussed and the patient's questions were addressed. Informed consent was obtained from the patient. The patient was placed prone on CT scan. Images of the pelvis were obtained. The right side of  back was prepped and draped in sterile fashion. The skin and right posterior iliac bone were anesthetized with 1% lidocaine. 11 gauge bone needle was directed into the right iliac bone with CT guidance. Two aspirates and one core biopsy obtained. Bandage placed over the puncture site. FINDINGS: Bone needle directed into the posterior right ilium. IMPRESSION: CT guided bone marrow aspirates and core biopsy. Electronically Signed   By: Markus Daft M.D.   On: 03/13/2016 14:35   Dg Bone Survey Met  Result Date: 03/07/2016 CLINICAL DATA:  Monoclonal gammopathy.  Rule out myeloma. EXAM: METASTATIC BONE SURVEY COMPARISON:  None. FINDINGS: No lytic lesions of myeloma identified on skeletal survey. Negative for fracture. Cervical spondylosis with prominent spurring at C5-6 and C6-7. No acute abnormality in the chest. IMPRESSION: No acute skeletal abnormality.  No evidence of myeloma. Electronically Signed   By: Franchot Gallo M.D.   On: 03/07/2016 08:17   Ct Bone Marrow Biopsy & Aspiration  Result Date: 03/13/2016 INDICATION: 71 year old with monoclonal gammopathy. EXAM: CT GUIDED BONE MARROW ASPIRATES AND BIOPSY Physician: Stephan Minister. Anselm Pancoast, MD MEDICATIONS: None. ANESTHESIA/SEDATION: Fentanyl 50 mcg IV; Versed 2.0 mg IV Moderate Sedation Time:  13 The patient was continuously monitored during the procedure by the interventional radiology nurse under my direct supervision. COMPLICATIONS: None immediate. PROCEDURE: The procedure was explained to the patient. The risks and benefits of the procedure were discussed and the patient's questions were addressed. Informed consent was obtained from the patient. The patient was placed prone on CT scan. Images of the pelvis were obtained. The right side of back was prepped and draped in sterile fashion. The skin and right posterior iliac bone were anesthetized with 1% lidocaine. 11 gauge bone needle was directed into the right iliac bone with CT guidance. Two aspirates and one core  biopsy obtained. Bandage placed over the puncture site. FINDINGS: Bone needle directed into the posterior right ilium. IMPRESSION: CT guided bone marrow aspirates and core biopsy. Electronically Signed   By: Markus Daft M.D.   On: 03/13/2016 14:35    ASSESSMENT & PLAN:   71 yo with multiple medical comorbidities but fairly good performance status overall with   1) ISS Stage II IgG Kapppa multiple myeloma. Anemia + Renal insufficiency. Bone survey neg for concerning bone lesions. significantly elevated kappafree light chains 858 mg percent, lambda 18.4, with a ratio of 46.65 . He was also noted to have an M spike of 1.7 g/dL with IFE showing IgG kappa monoclonal protein. Quantitative IgG level was increased to nearly 2500 mg/dL. 24-hour UPEP showed monoclonal protein of 1300 mg per 24 hours which constituted about 86% of all the urinary protein. Bone marrow biopsy showed 20-30% Restricted plasma cells consistent with multiple myeloma Cytogenetics/FISH pending.  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 Had an element of obstructive uropathy due to BPH which has resolved. Creatinine some somewhat improved from 3.5 to 2.9 3) normocytic anemia likely related to multiple myeloma.-stable. Plan -  I discussed with the patient and his wife the likely diagnosis of multiple myeloma, ending workup including bone marrow biopsy results, prognosis and likely treatment options. - Recommended starting treatment with CyBorD (plan built) -chemo-counseling -He will also need to discuss a plan for treatment of his diabetes and consideration of sliding scale insulin since he will likely be on high-dose steroids which will cause hyperglycemia and uncontrolled diabetes. 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 -No indication for PRBC transfusion at this time.  Chemo-counseling for CyBorD CyBorD starting treatment 03/24/2016 Labs D1  and D8 every 21 day cycle RTC with Dr Irene Limbo in C1D8 for toxicity check Nephrology referral   I spent 30 minutes counseling the patient face to face. The total time spent in the appointment was 40 minutes and more than 50% was on counseling and direct patient cares.    Sullivan Lone MD [place] AAHIVMS [facility] Hematology/Oncology Physician [facility]  (Office):       [phone] (Work cell):  [phone] (Fax):           [phone]

## 2016-03-28 ENCOUNTER — Encounter (HOSPITAL_COMMUNITY): Payer: Self-pay

## 2016-03-30 ENCOUNTER — Other Ambulatory Visit: Payer: Self-pay | Admitting: Hematology

## 2016-03-31 ENCOUNTER — Other Ambulatory Visit: Payer: Self-pay | Admitting: *Deleted

## 2016-03-31 DIAGNOSIS — K047 Periapical abscess without sinus: Secondary | ICD-10-CM | POA: Insufficient documentation

## 2016-03-31 DIAGNOSIS — R590 Localized enlarged lymph nodes: Secondary | ICD-10-CM

## 2016-03-31 DIAGNOSIS — C9000 Multiple myeloma not having achieved remission: Secondary | ICD-10-CM

## 2016-03-31 DIAGNOSIS — L720 Epidermal cyst: Secondary | ICD-10-CM | POA: Insufficient documentation

## 2016-03-31 HISTORY — DX: Localized enlarged lymph nodes: R59.0

## 2016-03-31 HISTORY — DX: Periapical abscess without sinus: K04.7

## 2016-03-31 HISTORY — DX: Epidermal cyst: L72.0

## 2016-03-31 MED ORDER — CYCLOPHOSPHAMIDE 50 MG PO TABS: 300.0000 mg/m2 | ORAL_TABLET | ORAL | 3 refills | Status: DC

## 2016-03-31 MED ORDER — DEXAMETHASONE 4 MG PO TABS: ORAL_TABLET | ORAL | 3 refills | Status: DC

## 2016-03-31 MED ORDER — ONDANSETRON HCL 8 MG PO TABS: 8.0000 mg | ORAL_TABLET | Freq: Two times a day (BID) | ORAL | 1 refills | Status: DC | PRN

## 2016-03-31 MED ORDER — PROCHLORPERAZINE MALEATE 10 MG PO TABS: 10.0000 mg | ORAL_TABLET | Freq: Four times a day (QID) | ORAL | 1 refills | Status: DC | PRN

## 2016-04-02 ENCOUNTER — Encounter: Payer: Self-pay | Admitting: Hematology

## 2016-04-02 ENCOUNTER — Other Ambulatory Visit: Payer: Medicare HMO

## 2016-04-02 ENCOUNTER — Ambulatory Visit (HOSPITAL_COMMUNITY)
Admission: RE | Admit: 2016-04-02 | Discharge: 2016-04-02 | Disposition: A | Discharge status: Home / Self Care | Payer: Medicare HMO | Source: Ambulatory Visit | Attending: Otolaryngology | Admitting: Otolaryngology

## 2016-04-02 ENCOUNTER — Encounter: Payer: Self-pay | Admitting: *Deleted

## 2016-04-02 ENCOUNTER — Other Ambulatory Visit (HOSPITAL_COMMUNITY): Payer: Self-pay | Admitting: Otolaryngology

## 2016-04-02 DIAGNOSIS — R590 Localized enlarged lymph nodes: Secondary | ICD-10-CM

## 2016-04-02 DIAGNOSIS — K047 Periapical abscess without sinus: Secondary | ICD-10-CM

## 2016-04-02 NOTE — Progress Notes (Signed)
Received PA request for Cytoxan.  Called [employer] to initiate and spoke with [facility]. Answered 3 clinical questions. Decision will be received via fax in 24-72 hours.

## 2016-04-03 ENCOUNTER — Other Ambulatory Visit: Payer: Self-pay | Admitting: *Deleted

## 2016-04-03 ENCOUNTER — Encounter: Payer: Self-pay | Admitting: Hematology

## 2016-04-03 ENCOUNTER — Other Ambulatory Visit: Payer: Self-pay | Admitting: Hematology

## 2016-04-03 DIAGNOSIS — C9000 Multiple myeloma not having achieved remission: Secondary | ICD-10-CM

## 2016-04-03 MED ORDER — CLINDAMYCIN HCL 300 MG PO CAPS: 300.0000 mg | ORAL_CAPSULE | Freq: Three times a day (TID) | ORAL | 0 refills | Status: AC

## 2016-04-03 MED ORDER — CYCLOPHOSPHAMIDE 50 MG PO TABS: 300.0000 mg/m2 | ORAL_TABLET | ORAL | 3 refills | Status: DC

## 2016-04-03 MED ORDER — CHLORHEXIDINE GLUCONATE 0.12 % MT SOLN: 15.0000 mL | Freq: Three times a day (TID) | OROMUCOSAL | 0 refills | Status: AC

## 2016-04-03 NOTE — Progress Notes (Signed)
Called patient to introduce myself as Arboriculturist and to ask if he had any financial questions or concerns. Patient had a question regarding co-pays to come here. Advised patient his specialist office visit co-pay will be charged only when he has an appointment to see the doctor. Patient verbalized understanding. I asked patient if he knows if he has met his deductible/OOP for his insurance. Patient states he has met his deductible for medications and not sure about the OOP. Asked patient if he would like for me to apply on his behalf through [place] whom currently has funding available for his diagnosis. He states that would be great. While enrolling patient online through portal, I asked him for a verbal of the annual gross household income. He and his spouse were able to provide this information to the best of their knowledge. Advised patient I would continue to work on application and would also place a note for them to see me at his next visit on 04/08/16 to introduce in person and go over application and have him sign any neccessary paperwork. They thanked me and verbalized understanding.     I submitted application online and it is pending documents. Will have patient and physician sign their portions and return to LLS once complete.

## 2016-04-08 ENCOUNTER — Ambulatory Visit (HOSPITAL_BASED_OUTPATIENT_CLINIC_OR_DEPARTMENT_OTHER): Payer: Medicare HMO

## 2016-04-08 ENCOUNTER — Encounter: Payer: Self-pay | Admitting: Hematology

## 2016-04-08 ENCOUNTER — Encounter: Payer: Self-pay | Admitting: *Deleted

## 2016-04-08 ENCOUNTER — Other Ambulatory Visit (HOSPITAL_BASED_OUTPATIENT_CLINIC_OR_DEPARTMENT_OTHER): Payer: Medicare HMO

## 2016-04-08 ENCOUNTER — Other Ambulatory Visit: Payer: Self-pay | Admitting: *Deleted

## 2016-04-08 VITALS — BP 151/78 | HR 76 | Temp 98.1°F | Resp 18

## 2016-04-08 DIAGNOSIS — Z5112 Encounter for antineoplastic immunotherapy: Secondary | ICD-10-CM

## 2016-04-08 DIAGNOSIS — C9000 Multiple myeloma not having achieved remission: Secondary | ICD-10-CM

## 2016-04-08 LAB — COMPREHENSIVE METABOLIC PANEL
ALBUMIN: 3.8 g/dL (ref 3.5–5.0)
ALK PHOS: 82 U/L (ref 40–150)
ALT: 31 U/L (ref 0–55)
ANION GAP: 11 meq/L (ref 3–11)
AST: 19 U/L (ref 5–34)
BILIRUBIN TOTAL: 0.36 mg/dL (ref 0.20–1.20)
BUN: 21 mg/dL (ref 7.0–26.0)
CO2: 21 mEq/L — ABNORMAL LOW (ref 22–29)
Calcium: 9.4 mg/dL (ref 8.4–10.4)
Chloride: 104 mEq/L (ref 98–109)
Creatinine: 2.5 mg/dL — ABNORMAL HIGH (ref 0.7–1.3)
EGFR: 29 mL/min/{1.73_m2} — AB (ref >90)
Glucose: 309 mg/dl — ABNORMAL HIGH (ref 70–140)
Potassium: 4 mEq/L (ref 3.5–5.1)
Sodium: 135 mEq/L — ABNORMAL LOW (ref 136–145)
Total Protein: 8.9 g/dL — ABNORMAL HIGH (ref 6.4–8.3)

## 2016-04-08 LAB — CBC & DIFF AND RETIC
BASO%: 0.1 % (ref 0.0–2.0)
Basophils Absolute: 0 10*3/uL (ref 0.0–0.1)
EOS%: 0.1 % (ref 0.0–7.0)
Eosinophils Absolute: 0 10*3/uL (ref 0.0–0.5)
HCT: 29.5 % — ABNORMAL LOW (ref 38.4–49.9)
HGB: 9.9 g/dL — ABNORMAL LOW (ref 13.0–17.1)
Immature Retic Fract: 2.8 % — ABNORMAL LOW (ref 3.00–10.60)
LYMPH%: 19.2 % (ref 14.0–49.0)
MCH: 27.6 pg (ref 27.2–33.4)
MCHC: 33.6 g/dL (ref 32.0–36.0)
MCV: 82.2 fL (ref 79.3–98.0)
MONO#: 0.2 10*3/uL (ref 0.1–0.9)
MONO%: 2.2 % (ref 0.0–14.0)
NEUT#: 5.4 10*3/uL (ref 1.5–6.5)
NEUT%: 78.4 % — AB (ref 39.0–75.0)
PLATELETS: 224 10*3/uL (ref 140–400)
RBC: 3.59 10*6/uL — ABNORMAL LOW (ref 4.20–5.82)
RDW: 14.8 % — AB (ref 11.0–14.6)
RETIC %: 0.7 % — AB (ref 0.80–1.80)
RETIC CT ABS: 25.13 10*3/uL — AB (ref 34.80–93.90)
WBC: 6.9 10*3/uL (ref 4.0–10.3)
lymph#: 1.3 10*3/uL (ref 0.9–3.3)
nRBC: 0 % (ref 0–0)

## 2016-04-08 MED ORDER — PROCHLORPERAZINE MALEATE 10 MG PO TABS
ORAL_TABLET | ORAL | Status: AC
  Filled 2016-04-08: qty 1

## 2016-04-08 MED ORDER — ACYCLOVIR 200 MG PO CAPS
200.0000 mg | ORAL_CAPSULE | Freq: Two times a day (BID) | ORAL | 3 refills | Status: DC
Start: 2016-04-08 — End: 2016-07-08

## 2016-04-08 MED ORDER — PROCHLORPERAZINE MALEATE 10 MG PO TABS
10.0000 mg | ORAL_TABLET | Freq: Once | ORAL | Status: AC
  Administered 2016-04-08: 10 mg via ORAL

## 2016-04-08 MED ORDER — BORTEZOMIB CHEMO SQ INJECTION 3.5 MG (2.5MG/ML)
1.3000 mg/m2 | Freq: Once | INTRAMUSCULAR | Status: AC
  Administered 2016-04-08: 2.5 mg via SUBCUTANEOUS
  Filled 2016-04-08: qty 2.5

## 2016-04-08 NOTE — Progress Notes (Signed)
Okay to treat with creatinine 2.5

## 2016-04-08 NOTE — Progress Notes (Signed)
Patient came to office. Introduced myself as Arboriculturist. Obtained signature on LLS application. Left physician form with RN for signature. Will then upload on the portal.

## 2016-04-08 NOTE — Progress Notes (Signed)
Pt tolerated injection well, pt monitored 30 minutes post Velcade injection. Pt and patient's wife educated and given printed discharge instructions, pt and patient's wife verbalizes understanding. Pt and VS stable at discharge.

## 2016-04-08 NOTE — Patient Instructions (Signed)
[facility] Discharge Instructions for Patients Receiving Chemotherapy  Today you received the following chemotherapy agents Velcade.  To help prevent nausea and vomiting after your treatment, we encourage you to take your nausea medication as directed by    If you develop nausea and vomiting that is not controlled by your nausea medication, call the clinic.   BELOW ARE SYMPTOMS THAT SHOULD BE REPORTED IMMEDIATELY:  *FEVER GREATER THAN 100.5 F  *CHILLS WITH OR WITHOUT FEVER  NAUSEA AND VOMITING THAT IS NOT CONTROLLED WITH YOUR NAUSEA MEDICATION  *UNUSUAL SHORTNESS OF BREATH  *UNUSUAL BRUISING OR BLEEDING  TENDERNESS IN MOUTH AND THROAT WITH OR WITHOUT PRESENCE OF ULCERS  *URINARY PROBLEMS  *BOWEL PROBLEMS  UNUSUAL RASH Items with * indicate a potential emergency and should be followed up as soon as possible.  Feel free to call the clinic you have any questions or concerns. The clinic phone number is [phone].  Please show the [place] at check-in to the Emergency Department and triage nurse.  Prochlorperazine tablets What is this medicine? PROCHLORPERAZINE (proe klor PER a zeen) helps to control severe nausea and vomiting. This medicine is also used to treat schizophrenia. It can also help patients who experience anxiety that is not due to psychological illness. This medicine may be used for other purposes; ask your health care provider or pharmacist if you have questions. COMMON BRAND NAME(S): Compazine What should I tell my health care provider before I take this medicine? They need to know if you have any of these conditions: -blood disorders or disease -dementia -liver disease or jaundice -Parkinson's disease -uncontrollable movement disorder -an unusual or allergic reaction to prochlorperazine, other medicines, foods, dyes, or preservatives -pregnant or trying to get pregnant -breast-feeding How should I use this medicine? Take this  medicine by mouth with a glass of water. Follow the directions on the prescription label. Take your doses at regular intervals. Do not take your medicine more often than directed. Do not stop taking this medicine suddenly. This can cause nausea, vomiting, and dizziness. Ask your doctor or health care professional for advice. Talk to your pediatrician regarding the use of this medicine in children. Special care may be needed. While this drug may be prescribed for children as young as 2 years for selected conditions, precautions do apply. Overdosage: If you think you have taken too much of this medicine contact a poison control center or emergency room at once. NOTE: This medicine is only for you. Do not share this medicine with others. What if I miss a dose? If you miss a dose, take it as soon as you can. If it is almost time for your next dose, take only that dose. Do not take double or extra doses. What may interact with this medicine? Do not take this medicine with any of the following medications: -amoxapine -antidepressants like citalopram, escitalopram, fluoxetine, paroxetine, and sertraline -deferoxamine -dofetilide -maprotiline -tricyclic antidepressants like amitriptyline, clomipramine, imipramine, nortiptyline and others This medicine may also interact with the following medications: -lithium -medicines for pain -phenytoin -propranolol -warfarin This list may not describe all possible interactions. Give your health care provider a list of all the medicines, herbs, non-prescription drugs, or dietary supplements you use. Also tell them if you smoke, drink alcohol, or use illegal drugs. Some items may interact with your medicine. What should I watch for while using this medicine? Visit your doctor or health care professional for regular checks on your progress. You may get drowsy or  dizzy. Do not drive, use machinery, or do anything that needs mental alertness until you know how this  medicine affects you. Do not stand or sit up quickly, especially if you are an older patient. This reduces the risk of dizzy or fainting spells. Alcohol may interfere with the effect of this medicine. Avoid alcoholic drinks. This medicine can reduce the response of your body to heat or cold. Dress warm in cold weather and stay hydrated in hot weather. If possible, avoid extreme temperatures like saunas, hot tubs, very hot or cold showers, or activities that can cause dehydration such as vigorous exercise. This medicine can make you more sensitive to the sun. Keep out of the sun. If you cannot avoid being in the sun, wear protective clothing and use sunscreen. Do not use sun lamps or tanning beds/booths. Your mouth may get dry. Chewing sugarless gum or sucking hard candy, and drinking plenty of water may help. Contact your doctor if the problem does not go away or is severe. What side effects may I notice from receiving this medicine? Side effects that you should report to your doctor or health care professional as soon as possible: -blurred vision -breast enlargement in men or women -breast milk in women who are not breast-feeding -chest pain, fast or irregular heartbeat -confusion, restlessness -dark yellow or brown urine -difficulty breathing or swallowing -dizziness or fainting spells -drooling, shaking, movement difficulty (shuffling walk) or rigidity -fever, chills, sore throat -involuntary or uncontrollable movements of the eyes, mouth, head, arms, and legs -seizures -stomach area pain -unusually weak or tired -unusual bleeding or bruising -yellowing of skin or eyes Side effects that usually do not require medical attention (report to your doctor or health care professional if they continue or are bothersome): -difficulty passing urine -difficulty sleeping -headache -sexual dysfunction -skin rash, or itching This list may not describe all possible side effects. Call your doctor for  medical advice about side effects. You may report side effects to FDA at 1-800-FDA-1088. Where should I keep my medicine? Keep out of the reach of children. Store at room temperature between 15 and 30 degrees C (59 and 86 degrees F). Protect from light. Throw away any unused medicine after the expiration date. NOTE: This sheet is a summary. It may not cover all possible information. If you have questions about this medicine, talk to your doctor, pharmacist, or health care provider.  2018 Elsevier/Gold Standard (2011-05-13 16:59:39)  Bortezomib injection What is this medicine? BORTEZOMIB (bor TEZ oh mib) is a medicine that targets proteins in cancer cells and stops the cancer cells from growing. It is used to treat multiple myeloma and mantle-cell lymphoma. This medicine may be used for other purposes; ask your health care provider or pharmacist if you have questions. COMMON BRAND NAME(S): Velcade What should I tell my health care provider before I take this medicine? They need to know if you have any of these conditions: -diabetes -heart disease -irregular heartbeat -liver disease -on hemodialysis -low blood counts, like low white blood cells, platelets, or hemoglobin -peripheral neuropathy -taking medicine for blood pressure -an unusual or allergic reaction to bortezomib, mannitol, boron, other medicines, foods, dyes, or preservatives -pregnant or trying to get pregnant -breast-feeding How should I use this medicine? This medicine is for injection into a vein or for injection under the skin. It is given by a health care professional in a hospital or clinic setting. Talk to your pediatrician regarding the use of this medicine in children. Special care may  be needed. Overdosage: If you think you have taken too much of this medicine contact a poison control center or emergency room at once. NOTE: This medicine is only for you. Do not share this medicine with others. What if I miss a  dose? It is important not to miss your dose. Call your doctor or health care professional if you are unable to keep an appointment. What may interact with this medicine? This medicine may interact with the following medications: -ketoconazole -rifampin -ritonavir -St. John's Wort This list may not describe all possible interactions. Give your health care provider a list of all the medicines, herbs, non-prescription drugs, or dietary supplements you use. Also tell them if you smoke, drink alcohol, or use illegal drugs. Some items may interact with your medicine. What should I watch for while using this medicine? You may get drowsy or dizzy. Do not drive, use machinery, or do anything that needs mental alertness until you know how this medicine affects you. Do not stand or sit up quickly, especially if you are an older patient. This reduces the risk of dizzy or fainting spells. In some cases, you may be given additional medicines to help with side effects. Follow all directions for their use. Call your doctor or health care professional for advice if you get a fever, chills or sore throat, or other symptoms of a cold or flu. Do not treat yourself. This drug decreases your body's ability to fight infections. Try to avoid being around people who are sick. This medicine may increase your risk to bruise or bleed. Call your doctor or health care professional if you notice any unusual bleeding. You may need blood work done while you are taking this medicine. In some patients, this medicine may cause a serious brain infection that may cause death. If you have any problems seeing, thinking, speaking, walking, or standing, tell your doctor right away. If you cannot reach your doctor, urgently seek other source of medical care. Check with your doctor or health care professional if you get an attack of severe diarrhea, nausea and vomiting, or if you sweat a lot. The loss of too much body fluid can make it  dangerous for you to take this medicine. Do not become pregnant while taking this medicine or for at least 2 months after stopping it. Women should inform their doctor if they wish to become pregnant or think they might be pregnant. Men should not father a child while taking this medicine and for at least 2 months after stopping it. There is a potential for serious side effects to an unborn child. Talk to your health care professional or pharmacist for more information. Do not breast-feed an infant while taking this medicine or for 2 months after stopping it. This medicine may interfere with the ability to have a child. You should talk with your doctor or health care professional if you are concerned about your fertility. What side effects may I notice from receiving this medicine? Side effects that you should report to your doctor or health care professional as soon as possible: -allergic reactions like skin rash, itching or hives, swelling of the face, lips, or tongue -breathing problems -changes in hearing -changes in vision -fast, irregular heartbeat -feeling faint or lightheaded, falls -pain, tingling, numbness in the hands or feet -right upper belly pain -seizures -swelling of the ankles, feet, hands -unusual bleeding or bruising -unusually weak or tired -vomiting -yellowing of the eyes or skin Side effects that usually do not  require medical attention (report to your doctor or health care professional if they continue or are bothersome): -changes in emotions or moods -constipation -diarrhea -loss of appetite -headache -irritation at site where injected -nausea This list may not describe all possible side effects. Call your doctor for medical advice about side effects. You may report side effects to FDA at 1-800-FDA-1088. Where should I keep my medicine? This drug is given in a hospital or clinic and will not be stored at home. NOTE: This sheet is a summary. It may not cover all  possible information. If you have questions about this medicine, talk to your doctor, pharmacist, or health care provider.  2018 Elsevier/Gold Standard (2015-11-22 15:53:51)

## 2016-04-09 ENCOUNTER — Encounter: Payer: Self-pay | Admitting: Hematology

## 2016-04-09 ENCOUNTER — Telehealth: Payer: Self-pay | Admitting: *Deleted

## 2016-04-09 LAB — KAPPA/LAMBDA LIGHT CHAINS
IG KAPPA FREE LIGHT CHAIN: 616.2 mg/L — AB (ref 3.3–19.4)
IG LAMBDA FREE LIGHT CHAIN: 16.2 mg/L (ref 5.7–26.3)
KAPPA/LAMBDA FLC RATIO: 38.04 — AB (ref 0.26–1.65)

## 2016-04-09 NOTE — Progress Notes (Signed)
Called [employer] to initiate PA and spoke with McGally. He submitted clinical answers and advised would receive a response in 24-72 hours.   Received PA approval from [facility] under Part Medicare part B for 2 years.  [place] and spoke with Jimmie Molly to advise of approval through Medicare B vs D. He states a new rx with the ICD 10 code on it needs to be submitted.   Will leave for oral chemo clinic.    Electronically signed by Minette Brine at 04/02/2016 5:32 PM      Documentation on 04/02/2016

## 2016-04-09 NOTE — Telephone Encounter (Signed)
Returned pt's call - stated he was prescribed Flomax in the hospital and now it's almost time for another refill. He wants to know if he should continue taking it or not . He has appt w/PCP on the 24th.

## 2016-04-09 NOTE — Telephone Encounter (Signed)
Does he feel like it is helping with his urinary retention symptoms? I am happy to give a 1 month supply if you send me a refill request. He can follow up with his PCP regarding continuation of the medication. Thanks!

## 2016-04-10 LAB — MULTIPLE MYELOMA PANEL, SERUM
ALBUMIN/GLOB SERPL: 0.9 (ref 0.7–1.7)
ALPHA2 GLOB SERPL ELPH-MCNC: 0.8 g/dL (ref 0.4–1.0)
Albumin SerPl Elph-Mcnc: 3.8 g/dL (ref 2.9–4.4)
Alpha 1: 0.2 g/dL (ref 0.0–0.4)
B-GLOBULIN SERPL ELPH-MCNC: 0.9 g/dL (ref 0.7–1.3)
Gamma Glob SerPl Elph-Mcnc: 2.6 g/dL — ABNORMAL HIGH (ref 0.4–1.8)
Globulin, Total: 4.7 g/dL — ABNORMAL HIGH (ref 2.2–3.9)
IgA, Qn, Serum: 106 mg/dL (ref 61–437)
IgM, Qn, Serum: 32 mg/dL (ref 15–143)
M PROTEIN SERPL ELPH-MCNC: 2 g/dL — AB
Total Protein: 8.5 g/dL (ref 6.0–8.5)

## 2016-04-10 NOTE — Telephone Encounter (Signed)
Talked to pt - stated he's just started chemo and taking many pills so he's not sure if Flomax is helping which he never knew he had urinary problems until after being in the hospital. He has refills on Flomax so he stated he will refill it and f/u with his PCP and go from there.

## 2016-04-10 NOTE — Telephone Encounter (Signed)
Ok great. Thanks Glenda.

## 2016-04-11 ENCOUNTER — Ambulatory Visit (HOSPITAL_BASED_OUTPATIENT_CLINIC_OR_DEPARTMENT_OTHER): Payer: Medicare HMO

## 2016-04-11 VITALS — BP 172/84 | HR 79 | Temp 97.8°F | Resp 18

## 2016-04-11 DIAGNOSIS — C9000 Multiple myeloma not having achieved remission: Secondary | ICD-10-CM

## 2016-04-11 DIAGNOSIS — Z5112 Encounter for antineoplastic immunotherapy: Secondary | ICD-10-CM

## 2016-04-11 MED ORDER — PROCHLORPERAZINE MALEATE 10 MG PO TABS
ORAL_TABLET | ORAL | Status: AC
  Filled 2016-04-11: qty 1

## 2016-04-11 MED ORDER — PROCHLORPERAZINE MALEATE 10 MG PO TABS
10.0000 mg | ORAL_TABLET | Freq: Once | ORAL | Status: AC
  Administered 2016-04-11: 10 mg via ORAL

## 2016-04-11 MED ORDER — BORTEZOMIB CHEMO SQ INJECTION 3.5 MG (2.5MG/ML)
1.3000 mg/m2 | Freq: Once | INTRAMUSCULAR | Status: AC
  Administered 2016-04-11: 2.5 mg via SUBCUTANEOUS
  Filled 2016-04-11: qty 2.5

## 2016-04-11 NOTE — Patient Instructions (Signed)
[facility] Discharge Instructions for Patients Receiving Chemotherapy  Today you received the following chemotherapy agents Velcade. To help prevent nausea and vomiting after your treatment, we encourage you to take your nausea medication as directed.  If you develop nausea and vomiting that is not controlled by your nausea medication, call the clinic.   BELOW ARE SYMPTOMS THAT SHOULD BE REPORTED IMMEDIATELY:  *FEVER GREATER THAN 100.5 F  *CHILLS WITH OR WITHOUT FEVER  NAUSEA AND VOMITING THAT IS NOT CONTROLLED WITH YOUR NAUSEA MEDICATION  *UNUSUAL SHORTNESS OF BREATH  *UNUSUAL BRUISING OR BLEEDING  TENDERNESS IN MOUTH AND THROAT WITH OR WITHOUT PRESENCE OF ULCERS  *URINARY PROBLEMS  *BOWEL PROBLEMS  UNUSUAL RASH Items with * indicate a potential emergency and should be followed up as soon as possible.  Feel free to call the clinic you have any questions or concerns. The clinic phone number is [phone].  Please show the CHEMO ALERT CARD at check-in to the Emergency Department and triage nurse.    

## 2016-04-14 ENCOUNTER — Encounter: Payer: Self-pay | Admitting: Hematology

## 2016-04-14 NOTE — Progress Notes (Signed)
Received signed physician form for [facility]. Uploaded the physician and patient forms on the [facility] portal for processing.

## 2016-04-15 ENCOUNTER — Other Ambulatory Visit (HOSPITAL_BASED_OUTPATIENT_CLINIC_OR_DEPARTMENT_OTHER): Payer: Medicare HMO

## 2016-04-15 ENCOUNTER — Telehealth: Payer: Self-pay | Admitting: Hematology

## 2016-04-15 ENCOUNTER — Encounter: Payer: Self-pay | Admitting: Hematology

## 2016-04-15 ENCOUNTER — Ambulatory Visit (HOSPITAL_BASED_OUTPATIENT_CLINIC_OR_DEPARTMENT_OTHER): Payer: Medicare HMO | Admitting: Hematology

## 2016-04-15 ENCOUNTER — Ambulatory Visit (HOSPITAL_BASED_OUTPATIENT_CLINIC_OR_DEPARTMENT_OTHER): Payer: Medicare HMO

## 2016-04-15 VITALS — BP 148/79 | HR 81 | Temp 98.2°F | Resp 16 | Wt 166.0 lb

## 2016-04-15 DIAGNOSIS — M189 Osteoarthritis of first carpometacarpal joint, unspecified: Secondary | ICD-10-CM | POA: Diagnosis not present

## 2016-04-15 DIAGNOSIS — C9000 Multiple myeloma not having achieved remission: Secondary | ICD-10-CM

## 2016-04-15 DIAGNOSIS — D649 Anemia, unspecified: Secondary | ICD-10-CM | POA: Diagnosis not present

## 2016-04-15 DIAGNOSIS — K047 Periapical abscess without sinus: Secondary | ICD-10-CM | POA: Diagnosis not present

## 2016-04-15 DIAGNOSIS — N08 Glomerular disorders in diseases classified elsewhere: Secondary | ICD-10-CM

## 2016-04-15 DIAGNOSIS — E119 Type 2 diabetes mellitus without complications: Secondary | ICD-10-CM | POA: Diagnosis not present

## 2016-04-15 DIAGNOSIS — Z5112 Encounter for antineoplastic immunotherapy: Secondary | ICD-10-CM | POA: Diagnosis not present

## 2016-04-15 LAB — COMPREHENSIVE METABOLIC PANEL
ALBUMIN: 3.5 g/dL (ref 3.5–5.0)
ALT: 36 U/L (ref 0–55)
AST: 21 U/L (ref 5–34)
Alkaline Phosphatase: 74 U/L (ref 40–150)
Anion Gap: 9 mEq/L (ref 3–11)
BILIRUBIN TOTAL: 0.33 mg/dL (ref 0.20–1.20)
BUN: 23.1 mg/dL (ref 7.0–26.0)
CALCIUM: 9.5 mg/dL (ref 8.4–10.4)
CO2: 24 mEq/L (ref 22–29)
Chloride: 105 mEq/L (ref 98–109)
Creatinine: 1.9 mg/dL — ABNORMAL HIGH (ref 0.7–1.3)
EGFR: 39 mL/min/{1.73_m2} — AB (ref >90)
GLUCOSE: 219 mg/dL — AB (ref 70–140)
Potassium: 4.4 mEq/L (ref 3.5–5.1)
SODIUM: 138 meq/L (ref 136–145)
TOTAL PROTEIN: 7.9 g/dL (ref 6.4–8.3)

## 2016-04-15 LAB — CBC & DIFF AND RETIC
BASO%: 0 % (ref 0.0–2.0)
Basophils Absolute: 0 10*3/uL (ref 0.0–0.1)
EOS ABS: 0 10*3/uL (ref 0.0–0.5)
EOS%: 0.7 % (ref 0.0–7.0)
HCT: 29.6 % — ABNORMAL LOW (ref 38.4–49.9)
HEMOGLOBIN: 9.8 g/dL — AB (ref 13.0–17.1)
IMMATURE RETIC FRACT: 4.7 % (ref 3.00–10.60)
LYMPH%: 20.8 % (ref 14.0–49.0)
MCH: 26.9 pg — AB (ref 27.2–33.4)
MCHC: 33.1 g/dL (ref 32.0–36.0)
MCV: 81.3 fL (ref 79.3–98.0)
MONO#: 0.3 10*3/uL (ref 0.1–0.9)
MONO%: 4.8 % (ref 0.0–14.0)
NEUT%: 73.7 % (ref 39.0–75.0)
NEUTROS ABS: 4.3 10*3/uL (ref 1.5–6.5)
Platelets: 174 10*3/uL (ref 140–400)
RBC: 3.64 10*6/uL — AB (ref 4.20–5.82)
RDW: 14.8 % — AB (ref 11.0–14.6)
RETIC %: 0.66 % — AB (ref 0.80–1.80)
Retic Ct Abs: 24.02 10*3/uL — ABNORMAL LOW (ref 34.80–93.90)
WBC: 5.8 10*3/uL (ref 4.0–10.3)
lymph#: 1.2 10*3/uL (ref 0.9–3.3)

## 2016-04-15 MED ORDER — PROCHLORPERAZINE MALEATE 10 MG PO TABS
10.0000 mg | ORAL_TABLET | Freq: Once | ORAL | Status: AC
  Administered 2016-04-15: 10 mg via ORAL

## 2016-04-15 MED ORDER — PROCHLORPERAZINE MALEATE 10 MG PO TABS
ORAL_TABLET | ORAL | Status: AC
  Filled 2016-04-15: qty 1

## 2016-04-15 MED ORDER — BORTEZOMIB CHEMO SQ INJECTION 3.5 MG (2.5MG/ML)
1.3000 mg/m2 | Freq: Once | INTRAMUSCULAR | Status: AC
  Administered 2016-04-15: 2.5 mg via SUBCUTANEOUS
  Filled 2016-04-15: qty 2.5

## 2016-04-15 MED ORDER — CHLORHEXIDINE GLUCONATE 0.12 % MT SOLN: 15.0000 mL | Freq: Two times a day (BID) | OROMUCOSAL | 0 refills | Status: DC

## 2016-04-15 NOTE — Patient Instructions (Signed)
Thank you for choosing [facility] to provide your oncology and hematology care.  To afford each patient quality time with our providers, please arrive 30 minutes before your scheduled appointment time.  If you arrive late for your appointment, you may be asked to reschedule.  We strive to give you quality time with our providers, and arriving late affects you and other patients whose appointments are after yours.  If you are a no show for multiple scheduled visits, you may be dismissed from the clinic at the providers discretion.   Again, thank you for choosing [facility], our hope is that these requests will decrease the amount of time that you wait before being seen by our physicians.  ______________________________________________________________________ Should you have questions after your visit to the [facility], please contact our office at [phone] between the hours of 8:30 and 4:30 p.m.    Voicemails left after 4:30p.m will not be returned until the following business day.   For prescription refill requests, please have your pharmacy contact us directly.  Please also try to allow 48 hours for prescription requests.   Please contact the scheduling department for questions regarding scheduling.  For scheduling of procedures such as PET scans, CT scans, MRI, Ultrasound, etc please contact central scheduling at [phone].   Resources For Cancer Patients and Caregivers:  American Cancer Society:  800-227-2345  Can help patients locate various types of support and financial assistance Cancer Care: 1-800-813-HOPE (4673) Provides financial assistance, online support groups, medication/co-pay assistance.   [place] DSS:  336-641-3447 Where to apply for food stamps, Medicaid, and utility assistance Medicare Rights Center: 800-333-4114 Helps people with Medicare understand their rights and benefits, navigate the Medicare system, and secure the  quality healthcare they deserve SCAT: 336-333-6589 [employer]'s shared-ride transportation service for eligible riders who have a disability that prevents them from riding the fixed route bus.   For additional information on assistance programs please contact our social worker:   Grier Hock/Abigail Elmore:  [phone] 

## 2016-04-15 NOTE — Telephone Encounter (Signed)
Appointments scheduled per 04/15/16 los. Patient was given a copy of the AVS report and appointment schedule,per 04/15/16 los. °

## 2016-04-15 NOTE — Patient Instructions (Signed)
[facility] Discharge Instructions for Patients Receiving Chemotherapy  Today you received the following chemotherapy agents Velcade. To help prevent nausea and vomiting after your treatment, we encourage you to take your nausea medication as directed.  If you develop nausea and vomiting that is not controlled by your nausea medication, call the clinic.   BELOW ARE SYMPTOMS THAT SHOULD BE REPORTED IMMEDIATELY:  *FEVER GREATER THAN 100.5 F  *CHILLS WITH OR WITHOUT FEVER  NAUSEA AND VOMITING THAT IS NOT CONTROLLED WITH YOUR NAUSEA MEDICATION  *UNUSUAL SHORTNESS OF BREATH  *UNUSUAL BRUISING OR BLEEDING  TENDERNESS IN MOUTH AND THROAT WITH OR WITHOUT PRESENCE OF ULCERS  *URINARY PROBLEMS  *BOWEL PROBLEMS  UNUSUAL RASH Items with * indicate a potential emergency and should be followed up as soon as possible.  Feel free to call the clinic you have any questions or concerns. The clinic phone number is [phone].  Please show the CHEMO ALERT CARD at check-in to the Emergency Department and triage nurse.    

## 2016-04-15 NOTE — Progress Notes (Signed)
[name]  HEMATOLOGY ONCOLOGY PROGRESS NOTE  Date of service: .04/15/2016  Patient Care Team: [name], MD as PCP - General (Family Medicine)  CC: f/u for multiple myeloma  Diagnosis:  IgG Kappa multiple myeloma   Current Treatment:  CyBorD  INTERVAL HISTORY: Patient and his wife are here for follow-up for a toxicity check after starting treatment for his myeloma with CyborD. He was seen by Dr Constance Holster and has a dental Xray which had shown a likely dental abscess. He completed his Clindamycin prescribed by me and has been doing peridex mouthwashes as directed and notes that his dental symptoms have resolved. He was given a dental referral by his primary care physician but wonders if he could see someone at the [facility]. He was given a referral to Dr. Enrique Sack for dental evaluation to determine if any dental extractions are needed.  No fevers no chills no night sweats . No overt toxicities from his myeloma treatment thus far . Labs are stable today with improvement in his creatinine which is now down to 1.9 . He notes that his blood sugars have been well-controlled and that he has been started on Lantus and sliding scale NovoLog insulin by his primary care physician.  REVIEW OF SYSTEMS:    10 Point review of systems of done and is negative except as noted above.  . Past Medical History:  Diagnosis Date  . CAD (coronary artery disease) 2006   stent LAD 3.0x18 Cypher, occluded RCA  . H/O non-insulin dependent diabetes mellitus   . Hyperlipidemia   . Hypertension     . Past Surgical History:  Procedure Laterality Date  . CARDIAC CATHETERIZATION  2006    . Social History  Substance Use Topics  . Smoking status: Never Smoker  . Smokeless tobacco: Never Used  . Alcohol use No    ALLERGIES:  has No Known Allergies.  MEDICATIONS:  Current Outpatient Prescriptions  Medication Sig Dispense Refill  . acyclovir (ZOVIRAX) 200 MG capsule Take 1 capsule (200 mg total) by  mouth 2 (two) times daily. 30 capsule 3  . amLODipine (NORVASC) 10 MG tablet Take 10 mg by mouth daily.    [name] aspirin 81 MG tablet Take 81 mg by mouth daily.    [name] atorvastatin (LIPITOR) 20 MG tablet Take 1 tablet (20 mg total) by mouth daily. 90 tablet 3  . cyclophosphamide (CYTOXAN) 50 MG tablet Take 12 tablets (600 mg total) by mouth once a week. Take days 1,8,15 of chemo 1hr before or 2hr after meals every 21d. Capsules. ICD:C90.00 36 tablet 3  . dexamethasone (DECADRON) 4 MG tablet Take 5 tablets (20 mg) on days 1, 8, and 15 of chemo. Repeat every 21 days. Take 2 tablets (52m) daily for 2 days after days1 and 8 of chemo 30 tablet 3  . glipiZIDE (GLUCOTROL) 5 MG tablet Take 10 mg by mouth every morning.     . insulin aspart (NOVOLOG) 100 UNIT/ML FlexPen Three times a day before meals per sliding scale    . Insulin Glargine (LANTUS SOLOSTAR) 100 UNIT/ML Solostar Pen     . metoprolol succinate (TOPROL-XL) 25 MG 24 hr tablet Take 3 tablets (75 mg total) by mouth daily. Take with or immediately following a meal. 90 tablet 1  . nitroGLYCERIN (NITROSTAT) 0.4 MG SL tablet Place 1 tablet (0.4 mg total) under the tongue every 5 (five) minutes x 3 doses as needed for chest pain. 30 tablet 12  . ondansetron (ZOFRAN) 8 MG tablet  Take 1 tablet (8 mg total) by mouth 2 (two) times daily as needed for refractory nausea / vomiting. 30 tablet 1  . prochlorperazine (COMPAZINE) 10 MG tablet Take 1 tablet (10 mg total) by mouth every 6 (six) hours as needed (Nausea or vomiting). 30 tablet 1  . tamsulosin (FLOMAX) 0.4 MG CAPS capsule Take 1 capsule (0.4 mg total) by mouth daily after supper. 30 capsule 2  . chlorhexidine (PERIDEX) 0.12 % solution Use as directed 15 mLs in the mouth or throat 2 (two) times daily. 473 mL 0   No current facility-administered medications for this visit.     PHYSICAL EXAMINATION: ECOG PERFORMANCE STATUS: 1 - Symptomatic but completely ambulatory  . Vitals:   04/15/16 1008  BP: (!)  148/79  Pulse: 81  Resp: 16  Temp: 98.2 F (36.8 C)    Filed Weights   04/15/16 1008  Weight: 166 lb (75.3 kg)   .Body mass index is 23.15 kg/m.  GENERAL:alert, in no acute distress and comfortable SKIN: no acute rashes, no significant lesions EYES: conjunctiva are pink and non-injected, sclera anicteric OROPHARYNX: MMM, no exudates, no oropharyngeal erythema or ulceration NECK: supple, no JVD LYMPH:  no palpable lymphadenopathy in the cervical, axillary or inguinal regions LUNGS: clear to auscultation b/l with normal respiratory effort HEART: regular rate & rhythm ABDOMEN:  normoactive bowel sounds , non tender, not distended. Extremity: no pedal edema PSYCH: alert & oriented x 3 with fluent speech NEURO: no focal motor/sensory deficits  LABORATORY DATA:   I have reviewed the data as listed  . CBC Latest Ref Rng & Units 04/15/2016 04/08/2016 03/20/2016  WBC 4.0 - 10.3 10e3/uL 5.8 6.9 5.3  Hemoglobin 13.0 - 17.1 g/dL 9.8(L) 9.9(L) 10.7(L)  Hematocrit 38.4 - 49.9 % 29.6(L) 29.5(L) 32.6(L)  Platelets 140 - 400 10e3/uL 174 224 199    . CMP Latest Ref Rng & Units 04/15/2016 04/08/2016 04/08/2016  Glucose 70 - 140 mg/dl 219(H) 309(H) -  BUN 7.0 - 26.0 mg/dL 23.1 21.0 -  Creatinine 0.7 - 1.3 mg/dL 1.9(H) 2.5(H) -  Sodium 136 - 145 mEq/L 138 135(L) -  Potassium 3.5 - 5.1 mEq/L 4.4 4.0 -  Chloride 101 - 111 mmol/L - - -  CO2 22 - 29 mEq/L 24 21(L) -  Calcium 8.4 - 10.4 mg/dL 9.5 9.4 -  Total Protein 6.4 - 8.3 g/dL 7.9 8.9(H) 8.5  Total Bilirubin 0.20 - 1.20 mg/dL 0.33 0.36 -  Alkaline Phos 40 - 150 U/L 74 82 -  AST 5 - 34 U/L 21 19 -  ALT 0 - 55 U/L 36 31 -     RADIOGRAPHIC STUDIES: I have personally reviewed the radiological images as listed and agreed with the findings in the report. Dg Orthopantogram  Result Date: 04/02/2016 CLINICAL DATA:  Infected tooth. EXAM: ORTHOPANTOGRAM/PANORAMIC COMPARISON:  None. FINDINGS: No fracture is noted. Extremely poor dentition is  noted. There is noted lucency surrounding the root of a right molar in the mandible which may represent infection. IMPRESSION: Extremely poor dentition is noted. No fracture is noted. Lucency surrounding root of molar and right mandible concerning for possible infection. Electronically Signed   By: Marijo Conception, M.D.   On: 04/02/2016 16:29    ASSESSMENT & PLAN:   71 yo with multiple medical comorbidities but fairly good performance status overall with   1) ISS Stage II IgG Kapppa multiple myeloma. Anemia + Renal insufficiency. Bone survey neg for concerning bone lesions. significantly elevated kappafree light chains  858 mg percent, lambda 18.4, with a ratio of 46.65 . He was also noted to have an M spike of 1.7 g/dL with IFE showing IgG kappa monoclonal protein. Quantitative IgG level was increased to nearly 2500 mg/dL. 24-hour UPEP showed monoclonal protein of 1300 mg per 24 hours which constituted about 86% of all the urinary protein. 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 Had an element of obstructive uropathy due to BPH which has resolved. Creatinine continues to improve from 3.5 to 2.9 to 1.9 3) normocytic anemia likely related to multiple myeloma.-stable. Plan -Patient has no prohibitive toxicities from his CyBorD treatment thus far - labs today are stable and we will continue with his treatment. -Continue follow-up with nephrology . -No indication for PRBC transfusion at this time.  4)  poor dentition with right mandibular dental abscess . Patient is completed course of clindamycin and noted that his tooth pain has resolved . Plan  -Continue Peridex mouthwash twice daily -given refill . -Given referral to Dr. Enrique Sack for dental evaluation to determine need for dental extraction and other dental cares .  5)  DM2 - continue management as per primary care physician   Continue treatment as per schedule RTC with Dr Irene Limbo C2D1 with labs Continue labs D1 and D8 every cycle  I spent 20 minutes counseling the patient face to face. The total time spent in the appointment was 25 minutes and more than 50% was on counseling and direct patient cares.    Sullivan Lone MD [place] AAHIVMS [facility] Hematology/Oncology [facility]  (Office):       [phone] (Work cell):  [phone] (Fax):           [phone]

## 2016-04-16 ENCOUNTER — Telehealth (HOSPITAL_COMMUNITY): Payer: Self-pay | Admitting: Dentistry

## 2016-04-16 ENCOUNTER — Telehealth: Payer: Self-pay | Admitting: Hematology

## 2016-04-16 NOTE — Telephone Encounter (Signed)
Called Dr. Ritta Slot office and spoke to Tennille - she has patient's name and med rec. - will take a look at the notes and f/u with Dr. Tommie Raymond and reach out to patient when appt is scheduled.

## 2016-04-16 NOTE — Telephone Encounter (Signed)
04/16/16  Pt. called to cancel Dental Consult w/Dr. Enrique Sack on 04/18/16 @ 7:45 due to economic concerns.  Pt. does not wish to reschedule.  LRI

## 2016-04-18 ENCOUNTER — Other Ambulatory Visit (HOSPITAL_COMMUNITY): Payer: Medicare HMO | Admitting: Dentistry

## 2016-04-18 ENCOUNTER — Ambulatory Visit (HOSPITAL_BASED_OUTPATIENT_CLINIC_OR_DEPARTMENT_OTHER): Payer: Medicare HMO

## 2016-04-18 VITALS — BP 145/74 | HR 76 | Temp 97.9°F | Resp 18

## 2016-04-18 DIAGNOSIS — C9000 Multiple myeloma not having achieved remission: Secondary | ICD-10-CM | POA: Diagnosis not present

## 2016-04-18 DIAGNOSIS — Z5112 Encounter for antineoplastic immunotherapy: Secondary | ICD-10-CM

## 2016-04-18 MED ORDER — PROCHLORPERAZINE MALEATE 10 MG PO TABS
ORAL_TABLET | ORAL | Status: AC
  Filled 2016-04-18: qty 1

## 2016-04-18 MED ORDER — PROCHLORPERAZINE MALEATE 10 MG PO TABS
10.0000 mg | ORAL_TABLET | Freq: Once | ORAL | Status: AC
  Administered 2016-04-18: 10 mg via ORAL

## 2016-04-18 MED ORDER — BORTEZOMIB CHEMO SQ INJECTION 3.5 MG (2.5MG/ML)
1.3000 mg/m2 | Freq: Once | INTRAMUSCULAR | Status: AC
  Administered 2016-04-18: 2.5 mg via SUBCUTANEOUS
  Filled 2016-04-18: qty 2.5

## 2016-04-18 NOTE — Patient Instructions (Signed)
[facility] Discharge Instructions for Patients Receiving Chemotherapy  Today you received the following chemotherapy agents:  Velcade  To help prevent nausea and vomiting after your treatment, we encourage you to take your nausea medication as prescribed.   If you develop nausea and vomiting that is not controlled by your nausea medication, call the clinic.   BELOW ARE SYMPTOMS THAT SHOULD BE REPORTED IMMEDIATELY:  *FEVER GREATER THAN 100.5 F  *CHILLS WITH OR WITHOUT FEVER  NAUSEA AND VOMITING THAT IS NOT CONTROLLED WITH YOUR NAUSEA MEDICATION  *UNUSUAL SHORTNESS OF BREATH  *UNUSUAL BRUISING OR BLEEDING  TENDERNESS IN MOUTH AND THROAT WITH OR WITHOUT PRESENCE OF ULCERS  *URINARY PROBLEMS  *BOWEL PROBLEMS  UNUSUAL RASH Items with * indicate a potential emergency and should be followed up as soon as possible.  Feel free to call the clinic you have any questions or concerns. The clinic phone number is [phone].  Please show the CHEMO ALERT CARD at check-in to the Emergency Department and triage nurse.   

## 2016-04-21 ENCOUNTER — Encounter (HOSPITAL_COMMUNITY): Payer: Self-pay | Admitting: Emergency Medicine

## 2016-04-21 ENCOUNTER — Emergency Department (HOSPITAL_COMMUNITY): Payer: Medicare HMO

## 2016-04-21 ENCOUNTER — Telehealth: Payer: Self-pay | Admitting: *Deleted

## 2016-04-21 ENCOUNTER — Emergency Department (HOSPITAL_COMMUNITY)
Admission: EM | Admit: 2016-04-21 | Discharge: 2016-04-22 | Disposition: A | Discharge status: Home / Self Care | Payer: Medicare HMO | Attending: Emergency Medicine | Admitting: Emergency Medicine

## 2016-04-21 DIAGNOSIS — I10 Essential (primary) hypertension: Secondary | ICD-10-CM | POA: Diagnosis not present

## 2016-04-21 DIAGNOSIS — K5909 Other constipation: Secondary | ICD-10-CM | POA: Diagnosis not present

## 2016-04-21 DIAGNOSIS — I2510 Atherosclerotic heart disease of native coronary artery without angina pectoris: Secondary | ICD-10-CM | POA: Diagnosis not present

## 2016-04-21 DIAGNOSIS — Z79899 Other long term (current) drug therapy: Secondary | ICD-10-CM | POA: Diagnosis not present

## 2016-04-21 DIAGNOSIS — Z7982 Long term (current) use of aspirin: Secondary | ICD-10-CM | POA: Diagnosis not present

## 2016-04-21 DIAGNOSIS — R109 Unspecified abdominal pain: Secondary | ICD-10-CM | POA: Diagnosis present

## 2016-04-21 HISTORY — DX: Malignant (primary) neoplasm, unspecified: C80.1

## 2016-04-21 LAB — CBC WITH DIFFERENTIAL/PLATELET
BASOS ABS: 0 10*3/uL (ref 0.0–0.1)
Basophils Relative: 0 %
EOS PCT: 1 %
Eosinophils Absolute: 0 10*3/uL (ref 0.0–0.7)
HCT: 27.3 % — ABNORMAL LOW (ref 39.0–52.0)
Hemoglobin: 9.7 g/dL — ABNORMAL LOW (ref 13.0–17.0)
LYMPHS ABS: 0.8 10*3/uL (ref 0.7–4.0)
LYMPHS PCT: 21 %
MCH: 27.8 pg (ref 26.0–34.0)
MCHC: 35.5 g/dL (ref 30.0–36.0)
MCV: 78.2 fL (ref 78.0–100.0)
MONO ABS: 0.5 10*3/uL (ref 0.1–1.0)
Monocytes Relative: 13 %
Neutro Abs: 2.5 10*3/uL (ref 1.7–7.7)
Neutrophils Relative %: 66 %
PLATELETS: 121 10*3/uL — AB (ref 150–400)
RBC: 3.49 MIL/uL — ABNORMAL LOW (ref 4.22–5.81)
RDW: 15.1 % (ref 11.5–15.5)
WBC: 3.8 10*3/uL — ABNORMAL LOW (ref 4.0–10.5)

## 2016-04-21 LAB — BASIC METABOLIC PANEL
Anion gap: 7 (ref 5–15)
BUN: 21 mg/dL — AB (ref 6–20)
CALCIUM: 8.8 mg/dL — AB (ref 8.9–10.3)
CHLORIDE: 109 mmol/L (ref 101–111)
CO2: 24 mmol/L (ref 22–32)
Creatinine, Ser: 1.49 mg/dL — ABNORMAL HIGH (ref 0.61–1.24)
GFR calc non Af Amer: 45 mL/min — ABNORMAL LOW (ref >60)
GFR, EST AFRICAN AMERICAN: 52 mL/min — AB (ref >60)
Glucose, Bld: 143 mg/dL — ABNORMAL HIGH (ref 65–99)
Potassium: 3.1 mmol/L — ABNORMAL LOW (ref 3.5–5.1)
SODIUM: 140 mmol/L (ref 135–145)

## 2016-04-21 LAB — URINALYSIS, ROUTINE W REFLEX MICROSCOPIC
BILIRUBIN URINE: NEGATIVE
Glucose, UA: NEGATIVE mg/dL
HGB URINE DIPSTICK: NEGATIVE
KETONES UR: NEGATIVE mg/dL
Leukocytes, UA: NEGATIVE
NITRITE: NEGATIVE
PH: 6 (ref 5.0–8.0)
Protein, ur: NEGATIVE mg/dL
SPECIFIC GRAVITY, URINE: 1.005 (ref 1.005–1.030)

## 2016-04-21 MED ORDER — POTASSIUM CHLORIDE CRYS ER 20 MEQ PO TBCR
30.0000 meq | EXTENDED_RELEASE_TABLET | Freq: Once | ORAL | Status: AC
  Administered 2016-04-21: 30 meq via ORAL
  Filled 2016-04-21: qty 1

## 2016-04-21 MED ORDER — SODIUM CHLORIDE 0.9 % IV SOLN
INTRAVENOUS | Status: DC
  Administered 2016-04-21: 20 mL/h via INTRAVENOUS

## 2016-04-21 NOTE — Consult Note (Signed)
Reason for Consult:  Possible appendicitis Chief complaint:  Bloating and discomfort on left side Referring Physician: Dr. Lacretia Leigh  Douglas Rose. is an 71 y.o. male.  HPI: Pt with multiple medical issues, recently diagnosed with Multiple myeloma, possible pancreatitis 2/24-27/2018. He reports presenting with abdominal bloating and some discomfort at that time also. Symptoms at that time are very similar to what he is experiencing today. He reports symptoms resolved after his hospitalization in February. Symptoms started again last evening around midnight and the discomfort and bloating persist currently.  He was seen and followed by Dr. Waymon Budge.  Bone Marrow biopsy 03/13/16. Seen on 04/15/16 by Dr. Ruben Im a diagnosis of IgG Kappa multiple myeloma, and is undergoing chemotherapy. Last treatment was 3 days ago. He presents to the ED with back/kidney pain that started last PM. Pain reported going to his left flank.  Nothing is making his symptoms better.  Last BM was yesterday and it was normal.    Work up in the ED shows BP is up.  K+ 3.1,creatinine is 1.49, WBC is 3.8, H/H 9.7/27, platelets are 121K.  UA is normal.  CT renal study without contrast: Mid to distal appendix top-normal in size with minimal haziness of adjacent fat planes. In the proper clinical setting, very early appendicitis cannot be excluded based on imaging. This represents a change from prior CT. He has a very heavy stool burden on CT also. We are asked to see.  Past Medical History:  Diagnosis Date  . CAD (coronary artery disease) 2006   stent LAD 3.0x18 Cypher, occluded RCA  . Cancer ([place])   . H/O non-insulin dependent diabetes mellitus   . Hyperlipidemia   . Hypertension     Past Surgical History:  Procedure Laterality Date  . CARDIAC CATHETERIZATION  2006    Family History  Problem Relation Age of Onset  . Arrhythmia Mother   . Diabetes Sister   . Diabetes Brother   . Diabetes  Brother     Social History:  reports that he has never smoked. He has never used smokeless tobacco. He reports that he does not drink alcohol or use drugs.  Allergies: No Known Allergies  Medications: Prior to Admission:  Prior to Admission medications   Medication Sig Start Date End Date Taking? Authorizing Provider  acyclovir (ZOVIRAX) 200 MG capsule Take 1 capsule (200 mg total) by mouth 2 (two) times daily. 04/08/16   Brunetta Genera, MD  amLODipine (NORVASC) 10 MG tablet Take 10 mg by mouth daily.    Historical Provider, MD  aspirin 81 MG tablet Take 81 mg by mouth daily.    Historical Provider, MD  atorvastatin (LIPITOR) 20 MG tablet Take 1 tablet (20 mg total) by mouth daily. 12/06/12   Burtis Junes, NP  chlorhexidine (PERIDEX) 0.12 % solution Use as directed 15 mLs in the mouth or throat 2 (two) times daily. 04/15/16   Brunetta Genera, MD  cyclophosphamide (CYTOXAN) 50 MG tablet Take 12 tablets (600 mg total) by mouth once a week. Take days 1,8,15 of chemo 1hr before or 2hr after meals every 21d. Capsules. ICD:C90.00 04/03/16   Brunetta Genera, MD  dexamethasone (DECADRON) 4 MG tablet Take 5 tablets (20 mg) on days 1, 8, and 15 of chemo. Repeat every 21 days. Take 2 tablets (26m) daily for 2 days after days1 and 8 of chemo 03/31/16   GBrunetta Genera MD  glipiZIDE (GLUCOTROL) 5 MG tablet Take 10 mg by  mouth every morning.  10/25/12   Historical Provider, MD  insulin aspart (NOVOLOG) 100 UNIT/ML FlexPen Three times a day before meals per sliding scale 04/09/16   Historical Provider, MD  Insulin Glargine (LANTUS SOLOSTAR) 100 UNIT/ML Solostar Pen  03/25/16   Historical Provider, MD  metoprolol succinate (TOPROL-XL) 25 MG 24 hr tablet Take 3 tablets (75 mg total) by mouth daily. Take with or immediately following a meal. 03/05/16   Velna Ochs, MD  nitroGLYCERIN (NITROSTAT) 0.4 MG SL tablet Place 1 tablet (0.4 mg total) under the tongue every 5 (five) minutes x 3 doses as  needed for chest pain. 11/13/12   Brooke O Edmisten, PA-C  ondansetron (ZOFRAN) 8 MG tablet Take 1 tablet (8 mg total) by mouth 2 (two) times daily as needed for refractory nausea / vomiting. 03/31/16   Brunetta Genera, MD  prochlorperazine (COMPAZINE) 10 MG tablet Take 1 tablet (10 mg total) by mouth every 6 (six) hours as needed (Nausea or vomiting). 03/31/16   Brunetta Genera, MD  tamsulosin (FLOMAX) 0.4 MG CAPS capsule Take 1 capsule (0.4 mg total) by mouth daily after supper. 03/04/16   Velna Ochs, MD     Results for orders placed or performed during the hospital encounter of 04/21/16 (from the past 48 hour(s))  CBC with Differential/Platelet     Status: Abnormal   Collection Time: 04/21/16  7:32 AM  Result Value Ref Range   WBC 3.8 (L) 4.0 - 10.5 K/uL   RBC 3.49 (L) 4.22 - 5.81 MIL/uL   Hemoglobin 9.7 (L) 13.0 - 17.0 g/dL   HCT 27.3 (L) 39.0 - 52.0 %   MCV 78.2 78.0 - 100.0 fL   MCH 27.8 26.0 - 34.0 pg   MCHC 35.5 30.0 - 36.0 g/dL   RDW 15.1 11.5 - 15.5 %   Platelets 121 (L) 150 - 400 K/uL   Neutrophils Relative % 66 %   Neutro Abs 2.5 1.7 - 7.7 K/uL   Lymphocytes Relative 21 %   Lymphs Abs 0.8 0.7 - 4.0 K/uL   Monocytes Relative 13 %   Monocytes Absolute 0.5 0.1 - 1.0 K/uL   Eosinophils Relative 1 %   Eosinophils Absolute 0.0 0.0 - 0.7 K/uL   Basophils Relative 0 %   Basophils Absolute 0.0 0.0 - 0.1 K/uL  Basic metabolic panel     Status: Abnormal   Collection Time: 04/21/16  7:32 AM  Result Value Ref Range   Sodium 140 135 - 145 mmol/L   Potassium 3.1 (L) 3.5 - 5.1 mmol/L   Chloride 109 101 - 111 mmol/L   CO2 24 22 - 32 mmol/L   Glucose, Bld 143 (H) 65 - 99 mg/dL   BUN 21 (H) 6 - 20 mg/dL   Creatinine, Ser 1.49 (H) 0.61 - 1.24 mg/dL   Calcium 8.8 (L) 8.9 - 10.3 mg/dL   GFR calc non Af Amer 45 (L) >60 mL/min   GFR calc Af Amer 52 (L) >60 mL/min    Comment: (NOTE) The eGFR has been calculated using the CKD EPI equation. This calculation has not been  validated in all clinical situations. eGFR's persistently <60 mL/min signify possible Chronic Kidney Disease.    Anion gap 7 5 - 15  Urinalysis, Routine w reflex microscopic     Status: Abnormal   Collection Time: 04/21/16  8:57 AM  Result Value Ref Range   Color, Urine COLORLESS (A) YELLOW   APPearance CLEAR CLEAR   Specific Gravity, Urine 1.005  1.005 - 1.030   pH 6.0 5.0 - 8.0   Glucose, UA NEGATIVE NEGATIVE mg/dL   Hgb urine dipstick NEGATIVE NEGATIVE   Bilirubin Urine NEGATIVE NEGATIVE   Ketones, ur NEGATIVE NEGATIVE mg/dL   Protein, ur NEGATIVE NEGATIVE mg/dL   Nitrite NEGATIVE NEGATIVE   Leukocytes, UA NEGATIVE NEGATIVE    Ct Renal Stone Study  Result Date: 04/21/2016 CLINICAL DATA:  71 year old hypertensive male with left flank pain starting last night. Abdominal bloating. New diagnosis of multiple myeloma 3 weeks ago. Chemotherapy in progress. Initial encounter. EXAM: CT ABDOMEN AND PELVIS WITHOUT CONTRAST TECHNIQUE: Multidetector CT imaging of the abdomen and pelvis was performed following the standard protocol without IV contrast. COMPARISON:  03/02/2016 CT and ultrasound. FINDINGS: Lower chest: Tiny granuloma left lung base. Minimal basilar atelectatic changes. Coronary artery calcifications suspected.  Heart size top-normal. Hepatobiliary: Taking into account limitation by non contrast imaging, no hepatic lesion. No calcified gallstone. Gallbladder sludge or noncalcified stones noted. Pancreas: Taking into account limitation by non contrast imaging, no mass or focal inflammation. Slight third spacing of fluid limits evaluation. Spleen: Taking into account limitation by non contrast imaging, no mass or enlargement. Adrenals/Urinary Tract: No renal or ureteral obstructing stone or evidence hydronephrosis. Taking into account limitation by non contrast imaging, no renal or adrenal lesion. Mild impression upon the bladder base by slightly lobulated prostate gland. Stomach/Bowel:  Scattered diverticula without surrounding inflammation. Mid to distal appendix top-normal in size with minimal haziness of adjacent fat planes. In the proper clinical setting, very early appendicitis cannot be excluded based on imaging. This represents a change from prior CT. Portions of stomach, small bowel and colon are under distended and evaluation limited. Vascular/Lymphatic: Atherosclerotic changes aorta, iliac arteries, femoral arteries and aortic branch vessels without aneurysm. Scattered small lymph nodes. Reproductive: Enlarged slightly lobulated prostate gland with impression upon the bladder base. Clinical laboratory correlation recommended. Other: Mild third spacing of fluid. Musculoskeletal: Subtle lucency left aspect L3 vertebral body (Series 2, image 34) may represent result of myeloma. Mild degenerative changes lumbar spine and hip joints. Small well-circumscribed lucency right intertrochanteric region felt to be incidental finding. IMPRESSION: No renal or ureteral obstructing stone or evidence hydronephrosis. Mid to distal appendix top-normal in size with minimal haziness of adjacent fat planes. In the proper clinical setting, very early appendicitis cannot be excluded. The minimal haziness of fat planes may be related to third spacing of fluid which is new from the prior exam. Scattered colonic diverticula without surrounding inflammation. Gallbladder sludge/ noncalcified stones noted. Subtle lucency left aspect L3 vertebral body (Series 2, image 34) may represent result of myeloma. Slightly lobulated prostate gland impresses upon the bladder base. Clinical and laboratory correlation recommended. Aortic atherosclerosis. Coronary artery calcifications. Call is into Dr. Zenia Resides (via Lattie Haw) currently on phone. Electronically Signed   By: Genia Del M.D.   On: 04/21/2016 08:37    Review of Systems  HENT: Negative.   Eyes: Negative.   Respiratory: Negative.   Cardiovascular: Negative.    Gastrointestinal: Negative for abdominal pain, blood in stool, constipation, diarrhea, heartburn, melena, nausea and vomiting.       Pt complains of bloating and discomfort left side and flank over kidney.  Genitourinary: Negative.   Musculoskeletal: Negative.   Skin: Negative.   Neurological: Negative.   Endo/Heme/Allergies: Negative.   Psychiatric/Behavioral: Negative.    Blood pressure (!) 157/94, pulse 79, temperature 97.8 F (36.6 C), temperature source Oral, resp. rate 16, SpO2 100 %. Physical Exam  Constitutional: He  is oriented to person, place, and time. He appears well-developed and well-nourished. No distress.  HENT:  Head: Normocephalic and atraumatic.  Mouth/Throat: No oropharyngeal exudate.  Eyes: Right eye exhibits no discharge. Left eye exhibits no discharge. No scleral icterus.  Pupils are equal  Neck: Normal range of motion. Neck supple. No JVD present. No tracheal deviation present. No thyromegaly present.  Cardiovascular: Normal rate, regular rhythm, normal heart sounds and intact distal pulses.   No murmur heard. Respiratory: Effort normal and breath sounds normal. No respiratory distress. He has no wheezes. He has no rales. He exhibits no tenderness.  GI: Soft. Bowel sounds are normal. He exhibits no distension and no mass. There is no tenderness. There is no rebound and no guarding.  She describes a bloating and fullness. Says it feels like he could get relief with a bowel movement. Some discomfort of the left kidney. On exam he's not really tender over his abdomen or sides.  Musculoskeletal: He exhibits no edema or tenderness.  Lymphadenopathy:    He has no cervical adenopathy.  Neurological: He is alert and oriented to person, place, and time. No cranial nerve deficit.  Skin: Skin is warm and dry. No rash noted. He is not diaphoretic. No erythema. No pallor.  Psychiatric: He has a normal mood and affect. His behavior is normal. Judgment and thought content  normal.    Assessment/Plan: Left flank abdominal discomfort/bloating IgG Kappa multiple myeloma on chemotherapy Chronic kidney disease CAD Type II diabetes Hypertension  Plan: Dr. Johney Maine to review the CT and see the patient. Current exam is not consistent with appendicitis.   Allee Busk 04/21/2016, 9:38 AM

## 2016-04-21 NOTE — ED Notes (Signed)
Pts wife given sandwich and a coke

## 2016-04-21 NOTE — ED Provider Notes (Signed)
[facility] DEPT Provider Note   CSN: [id number] Arrival date & time: 04/21/16  0715     History   Chief Complaint Chief Complaint  Patient presents with  . Back Pain  . Abdominal Pain    HPI Douglas Overby. is a 71 y.o. male.  71 year old male presents with 12 hour history of midline Abdominal pain going to his left flank. Pain characterized as cramping and has been persistent and not associated with fever, vomiting, nausea. Has had some urinary frequency but denies any dysuria or hematuria. No prior history of renal colic. He is currently being treated for multiple myeloma and last chemotherapy treatment was 3 days ago. No history of same. Nothing makes his symptoms better. Last bowel movement was yesterday and was normal per him. He has passing flatus.      Past Medical History:  Diagnosis Date  . CAD (coronary artery disease) 2006   stent LAD 3.0x18 Cypher, occluded RCA  . Cancer ([place])   . H/O non-insulin dependent diabetes mellitus   . Hyperlipidemia   . Hypertension     Patient Active Problem List   Diagnosis Date Noted  . Multiple myeloma ([place]) 03/20/2016  . Acute renal failure ([place]) 03/06/2016  . Monoclonal gammopathy   . Protein-calorie malnutrition, severe 03/03/2016  . Pancreatitis 03/02/2016  . Enlarged prostate 03/02/2016  . Upper abdominal pain   . Urinary retention   . Acute renal failure (ARF) ([place]) 03/01/2016  . Abdominal fullness 02/26/2016  . Lymphadenopathy 11/21/2015  . Lipoma 07/19/2014  . Coronary artery disease   . Hyperlipidemia   . Hypertension   . H/O non-insulin dependent diabetes mellitus     Past Surgical History:  Procedure Laterality Date  . CARDIAC CATHETERIZATION  2006       Home Medications    Prior to Admission medications   Medication Sig Start Date End Date Taking? Authorizing Provider  acyclovir (ZOVIRAX) 200 MG capsule Take 1 capsule (200 mg total) by mouth 2 (two) times daily. 04/08/16   Brunetta Genera, MD  amLODipine (NORVASC) 10 MG tablet Take 10 mg by mouth daily.    Historical Provider, MD  aspirin 81 MG tablet Take 81 mg by mouth daily.    Historical Provider, MD  atorvastatin (LIPITOR) 20 MG tablet Take 1 tablet (20 mg total) by mouth daily. 12/06/12   Burtis Junes, NP  chlorhexidine (PERIDEX) 0.12 % solution Use as directed 15 mLs in the mouth or throat 2 (two) times daily. 04/15/16   Brunetta Genera, MD  cyclophosphamide (CYTOXAN) 50 MG tablet Take 12 tablets (600 mg total) by mouth once a week. Take days 1,8,15 of chemo 1hr before or 2hr after meals every 21d. Capsules. ICD:C90.00 04/03/16   Brunetta Genera, MD  dexamethasone (DECADRON) 4 MG tablet Take 5 tablets (20 mg) on days 1, 8, and 15 of chemo. Repeat every 21 days. Take 2 tablets (60m) daily for 2 days after days1 and 8 of chemo 03/31/16   GBrunetta Genera MD  glipiZIDE (GLUCOTROL) 5 MG tablet Take 10 mg by mouth every morning.  10/25/12   Historical Provider, MD  insulin aspart (NOVOLOG) 100 UNIT/ML FlexPen Three times a day before meals per sliding scale 04/09/16   Historical Provider, MD  Insulin Glargine (LANTUS SOLOSTAR) 100 UNIT/ML Solostar Pen  03/25/16   Historical Provider, MD  metoprolol succinate (TOPROL-XL) 25 MG 24 hr tablet Take 3 tablets (75 mg total) by mouth daily. Take with or immediately following  a meal. 03/05/16   Velna Ochs, MD  nitroGLYCERIN (NITROSTAT) 0.4 MG SL tablet Place 1 tablet (0.4 mg total) under the tongue every 5 (five) minutes x 3 doses as needed for chest pain. 11/13/12   Brooke O Edmisten, PA-C  ondansetron (ZOFRAN) 8 MG tablet Take 1 tablet (8 mg total) by mouth 2 (two) times daily as needed for refractory nausea / vomiting. 03/31/16   Brunetta Genera, MD  prochlorperazine (COMPAZINE) 10 MG tablet Take 1 tablet (10 mg total) by mouth every 6 (six) hours as needed (Nausea or vomiting). 03/31/16   Brunetta Genera, MD  tamsulosin (FLOMAX) 0.4 MG CAPS capsule Take 1 capsule  (0.4 mg total) by mouth daily after supper. 03/04/16   Velna Ochs, MD    Family History Family History  Problem Relation Age of Onset  . Arrhythmia Mother   . Diabetes Sister   . Diabetes Brother   . Diabetes Brother     Social History Social History  Substance Use Topics  . Smoking status: Never Smoker  . Smokeless tobacco: Never Used  . Alcohol use No     Allergies   Patient has no known allergies.   Review of Systems Review of Systems  All other systems reviewed and are negative.    Physical Exam Updated Vital Signs BP (!) 157/94 (BP Location: Right Arm)   Pulse 79   Temp 97.8 F (36.6 C) (Oral)   Resp 16   SpO2 100%   Physical Exam  Constitutional: He is oriented to person, place, and time. He appears well-developed and well-nourished.  Non-toxic appearance. No distress.  HENT:  Head: Normocephalic and atraumatic.  Eyes: Conjunctivae, EOM and lids are normal. Pupils are equal, round, and reactive to light.  Neck: Normal range of motion. Neck supple. No tracheal deviation present. No thyroid mass present.  Cardiovascular: Normal rate, regular rhythm and normal heart sounds.  Exam reveals no gallop.   No murmur heard. Pulmonary/Chest: Effort normal and breath sounds normal. No stridor. No respiratory distress. He has no decreased breath sounds. He has no wheezes. He has no rhonchi. He has no rales.  Abdominal: Soft. Normal appearance and bowel sounds are normal. He exhibits no distension. There is no tenderness. There is no rigidity, no rebound, no guarding and no CVA tenderness.  Musculoskeletal: Normal range of motion. He exhibits no edema or tenderness.  Neurological: He is alert and oriented to person, place, and time. He has normal strength. No cranial nerve deficit or sensory deficit. GCS eye subscore is 4. GCS verbal subscore is 5. GCS motor subscore is 6.  Skin: Skin is warm and dry. No abrasion and no rash noted.  Psychiatric: He has a normal mood  and affect. His speech is normal and behavior is normal.  Nursing note and vitals reviewed.    ED Treatments / Results  Labs (all labs ordered are listed, but only abnormal results are displayed) Labs Reviewed - No data to display  EKG  EKG Interpretation None       Radiology No results found.  Procedures Procedures (including critical care time)  Medications Ordered in ED Medications - No data to display   Initial Impression / Assessment and Plan / ED Course  I have reviewed the triage vital signs and the nursing notes.  Pertinent labs & imaging results that were available during my care of the patient were reviewed by me and considered in my medical decision making (see chart for details).  Patient's labs and x-rays noted. Potassium was low at 3.1 and he was given oral potassium. Discussed the case with general surgery and will see the patient for evaluation of possible early appendicitis.  Final Clinical Impressions(s) / ED Diagnoses   Final diagnoses:  None    New Prescriptions New Prescriptions   No medications on file     Lacretia Leigh, MD 04/21/16 [phone]

## 2016-04-21 NOTE — ED Triage Notes (Signed)
Per pt, states same symptoms one month ago-was diagnosed with myeloma-states back/kidney pain and abdominal pain since last night-states no dysuria

## 2016-04-21 NOTE — ED Notes (Signed)
Pt providing urine sample.

## 2016-04-21 NOTE — Telephone Encounter (Signed)
"  This is Licensed conveyancer calling to notify [name] we've been in the ED for three hours.  Douglas Rose is having the same pain to his left side he had before he was diagnosed with the myeloma.  They're saying his appendix is swollen.  Calling as I'm not sure what we need to do."  Confirmed presenting their chemo alert card to ED staff and are in a room.   Notified her the ED staff will take care of this visit.  If ED staff has any questions they will contact [name].  No further questions or needs at this time.

## 2016-04-22 ENCOUNTER — Telehealth: Payer: Self-pay | Admitting: *Deleted

## 2016-04-22 ENCOUNTER — Encounter: Payer: Self-pay | Admitting: Hematology

## 2016-04-22 NOTE — Progress Notes (Signed)
Reviewed portal on LLS for co-pay assistance. Patient approved 04/17/16-04/05/17 for up to $7500. A copy was mailed to patient as well. Emailed a copy of approval letter and POE to billing.

## 2016-04-22 NOTE — Telephone Encounter (Signed)
FYI, forty minute phone call.  On 04-29-2016 F/U please provide detailed calender with generic medicine names for Cycle 2 and every cycle.  "I am confused about what medications to give my husband.  He is on Cycle 15.  I gave him two pills or 8 mg of the dexamethasone.  Does he need to take the cytoxan and how much.  The names on the bottles are not what they should be so I'm looking at the dose on te bottles.  He was discharged and is at home.  I did not give him the decadron on 04-09-2016 or 04-10-2016 because I did not know.  Is this going to hurt him taking the pills the wrong way?"  Twenty-one days equals one cycle.  Today is day fifteen.  He is to take five of the decadron or dexamethasone 4 mg tablets today with food and take twelve cytoxan or cyclophosphamide 50 mg one hour before or two hours after a meal.  The brand and generic names begin with the same alphabet letter.  Answered questions.  Spouse and patient on this call have no further questions.  "Will bring bottles to [facility] for the visit on 04-29-2016.  Will take there to make sure he's taking them correctly."

## 2016-04-29 ENCOUNTER — Ambulatory Visit: Payer: Medicare HMO

## 2016-04-29 ENCOUNTER — Telehealth: Payer: Self-pay | Admitting: Hematology

## 2016-04-29 ENCOUNTER — Other Ambulatory Visit (HOSPITAL_BASED_OUTPATIENT_CLINIC_OR_DEPARTMENT_OTHER): Payer: Medicare HMO

## 2016-04-29 ENCOUNTER — Ambulatory Visit (HOSPITAL_BASED_OUTPATIENT_CLINIC_OR_DEPARTMENT_OTHER): Payer: Medicare HMO | Admitting: Hematology

## 2016-04-29 ENCOUNTER — Encounter: Payer: Self-pay | Admitting: Hematology

## 2016-04-29 VITALS — BP 177/74 | HR 84 | Temp 98.1°F | Resp 18 | Ht 71.0 in | Wt 167.8 lb

## 2016-04-29 DIAGNOSIS — E11649 Type 2 diabetes mellitus with hypoglycemia without coma: Secondary | ICD-10-CM | POA: Diagnosis not present

## 2016-04-29 DIAGNOSIS — K089 Disorder of teeth and supporting structures, unspecified: Secondary | ICD-10-CM

## 2016-04-29 DIAGNOSIS — N179 Acute kidney failure, unspecified: Secondary | ICD-10-CM | POA: Diagnosis not present

## 2016-04-29 DIAGNOSIS — C9000 Multiple myeloma not having achieved remission: Secondary | ICD-10-CM

## 2016-04-29 DIAGNOSIS — D472 Monoclonal gammopathy: Secondary | ICD-10-CM

## 2016-04-29 DIAGNOSIS — R740 Nonspecific elevation of levels of transaminase and lactic acid dehydrogenase [LDH]: Secondary | ICD-10-CM

## 2016-04-29 DIAGNOSIS — N289 Disorder of kidney and ureter, unspecified: Secondary | ICD-10-CM | POA: Diagnosis not present

## 2016-04-29 DIAGNOSIS — D649 Anemia, unspecified: Secondary | ICD-10-CM

## 2016-04-29 LAB — COMPREHENSIVE METABOLIC PANEL
ALK PHOS: 99 U/L (ref 40–150)
ALT: 171 U/L — ABNORMAL HIGH (ref 0–55)
AST: 106 U/L — ABNORMAL HIGH (ref 5–34)
Albumin: 3.6 g/dL (ref 3.5–5.0)
Anion Gap: 11 mEq/L (ref 3–11)
BUN: 13.6 mg/dL (ref 7.0–26.0)
CALCIUM: 8.9 mg/dL (ref 8.4–10.4)
CO2: 25 mEq/L (ref 22–29)
Chloride: 102 mEq/L (ref 98–109)
Creatinine: 1.6 mg/dL — ABNORMAL HIGH (ref 0.7–1.3)
EGFR: 48 mL/min/{1.73_m2} — AB (ref >90)
Glucose: 517 mg/dl — ABNORMAL HIGH (ref 70–140)
POTASSIUM: 4.3 meq/L (ref 3.5–5.1)
Sodium: 138 mEq/L (ref 136–145)
Total Bilirubin: 0.23 mg/dL (ref 0.20–1.20)
Total Protein: 7.6 g/dL (ref 6.4–8.3)

## 2016-04-29 LAB — CBC & DIFF AND RETIC
BASO%: 0 % (ref 0.0–2.0)
BASOS ABS: 0 10*3/uL (ref 0.0–0.1)
EOS ABS: 0 10*3/uL (ref 0.0–0.5)
EOS%: 0.2 % (ref 0.0–7.0)
HEMATOCRIT: 30.4 % — AB (ref 38.4–49.9)
HEMOGLOBIN: 10.2 g/dL — AB (ref 13.0–17.1)
Immature Retic Fract: 3.1 % (ref 3.00–10.60)
LYMPH%: 5.2 % — AB (ref 14.0–49.0)
MCH: 28 pg (ref 27.2–33.4)
MCHC: 33.6 g/dL (ref 32.0–36.0)
MCV: 83.5 fL (ref 79.3–98.0)
MONO#: 0.2 10*3/uL (ref 0.1–0.9)
MONO%: 2.6 % (ref 0.0–14.0)
NEUT%: 92 % — ABNORMAL HIGH (ref 39.0–75.0)
NEUTROS ABS: 5.3 10*3/uL (ref 1.5–6.5)
Platelets: 242 10*3/uL (ref 140–400)
RBC: 3.64 10*6/uL — ABNORMAL LOW (ref 4.20–5.82)
RDW: 16.7 % — ABNORMAL HIGH (ref 11.0–14.6)
Retic %: 1.77 % (ref 0.80–1.80)
Retic Ct Abs: 64.43 10*3/uL (ref 34.80–93.90)
WBC: 5.8 10*3/uL (ref 4.0–10.3)
lymph#: 0.3 10*3/uL — ABNORMAL LOW (ref 0.9–3.3)

## 2016-04-29 NOTE — Telephone Encounter (Signed)
Scheduled addition cycles for treatment per treatment plan

## 2016-04-29 NOTE — Telephone Encounter (Signed)
Scheduled appt per 4/24 los. And 5/8 follow up and lab per Power ( verbal) - [facility] off site on 5/4.

## 2016-04-29 NOTE — Progress Notes (Signed)
[name]  HEMATOLOGY ONCOLOGY PROGRESS NOTE  Date of service: .04/29/2016  Patient Care Team: [name], MD as PCP - General (Family Medicine)  CC: f/u for multiple myeloma  Diagnosis:  IgG Kappa multiple myeloma   Current Treatment:  CyBorD  INTERVAL HISTORY: Patient and his wife are here for follow-up prior to his second cycle of treatment for his multiple myeloma. He had severe hypoglycemia this morning with a blood sugar in the 20s requiring EMS to be called. He notes he is had a few low blood sugars. It appears that he was continued take his glipizide despite this being discontinued by his primary care physician. He was taking this in addition to his Lantus and NovoLog. He required dietary interventions and oral sugars to try to and his labs show a bump in his blood sugar to the 500s right now. He also didn't get his morning dexamethasone and did take a cyclophosphamide. He was asked to immediately call his primary care physician to optimize his diabetes management. Given that his blood sugars are this high and he had some bump in his liver functions-transaminases we decided to hold his Velcade dose today and recheck labs on day 4 before additional Velcade. No fevers no chills. he has been eating healthy and notes that his blood sugars have generally been well-controlled.  REVIEW OF SYSTEMS:    10 Point review of systems of done and is negative except as noted above.  . Past Medical History:  Diagnosis Date  . CAD (coronary artery disease) 2006   stent LAD 3.0x18 [place], occluded RCA  . Cancer ([place])   . H/O non-insulin dependent diabetes mellitus   . Hyperlipidemia   . Hypertension     . Past Surgical History:  Procedure Laterality Date  . CARDIAC CATHETERIZATION  2006    . Social History  Substance Use Topics  . Smoking status: Never Smoker  . Smokeless tobacco: Never Used  . Alcohol use No    ALLERGIES:  has No Known Allergies.  MEDICATIONS:  Current  Outpatient Prescriptions  Medication Sig Dispense Refill  . acyclovir (ZOVIRAX) 200 MG capsule Take 1 capsule (200 mg total) by mouth 2 (two) times daily. 30 capsule 3  . amLODipine (NORVASC) 10 MG tablet Take 10 mg by mouth daily.    [name] aspirin 81 MG tablet Take 81 mg by mouth daily.    [name] atorvastatin (LIPITOR) 20 MG tablet Take 1 tablet (20 mg total) by mouth daily. 90 tablet 3  . chlorhexidine (PERIDEX) 0.12 % solution Use as directed 15 mLs in the mouth or throat 2 (two) times daily. 473 mL 0  . cyclophosphamide (CYTOXAN) 50 MG tablet Take 12 tablets (600 mg total) by mouth once a week. Take days 1,8,15 of chemo 1hr before or 2hr after meals every 21d. Capsules. ICD:C90.00 36 tablet 3  . dexamethasone (DECADRON) 4 MG tablet Take 5 tablets (20 mg) on days 1, 8, and 15 of chemo. Repeat every 21 days. Take 2 tablets (29m) daily for 2 days after days1 and 8 of chemo 30 tablet 3  . glipiZIDE (GLUCOTROL) 5 MG tablet Take 15 mg by mouth every morning.     . insulin aspart (NOVOLOG) 100 UNIT/ML FlexPen Three times a day before meals per sliding scale. between 2-6 on average    . Insulin Glargine (LANTUS SOLOSTAR) 100 UNIT/ML Solostar Pen Inject 5 Units into the skin daily.     . metoprolol succinate (TOPROL-XL) 25 MG 24 hr tablet Take  3 tablets (75 mg total) by mouth daily. Take with or immediately following a meal. 90 tablet 1  . nitroGLYCERIN (NITROSTAT) 0.4 MG SL tablet Place 1 tablet (0.4 mg total) under the tongue every 5 (five) minutes x 3 doses as needed for chest pain. 30 tablet 12  . ondansetron (ZOFRAN) 8 MG tablet Take 1 tablet (8 mg total) by mouth 2 (two) times daily as needed for refractory nausea / vomiting. 30 tablet 1  . prochlorperazine (COMPAZINE) 10 MG tablet Take 1 tablet (10 mg total) by mouth every 6 (six) hours as needed (Nausea or vomiting). 30 tablet 1  . tamsulosin (FLOMAX) 0.4 MG CAPS capsule Take 1 capsule (0.4 mg total) by mouth daily after supper. 30 capsule 2   No current  facility-administered medications for this visit.     PHYSICAL EXAMINATION: ECOG PERFORMANCE STATUS: 1 - Symptomatic but completely ambulatory  . Vitals:   04/29/16 0840  BP: (!) 177/74  Pulse: 84  Resp: 18  Temp: 98.1 F (36.7 C)    Filed Weights   04/29/16 0840  Weight: 167 lb 12.8 oz (76.1 kg)   .Body mass index is 23.4 kg/m.  GENERAL:alert, in no acute distress and comfortable SKIN: no acute rashes, no significant lesions EYES: conjunctiva are pink and non-injected, sclera anicteric OROPHARYNX: MMM, no exudates, no oropharyngeal erythema or ulceration NECK: supple, no JVD LYMPH:  no palpable lymphadenopathy in the cervical, axillary or inguinal regions LUNGS: clear to auscultation b/l with normal respiratory effort HEART: regular rate & rhythm ABDOMEN:  normoactive bowel sounds , non tender, not distended. Extremity: no pedal edema PSYCH: alert & oriented x 3 with fluent speech NEURO: no focal motor/sensory deficits  LABORATORY DATA:   I have reviewed the data as listed  . CBC Latest Ref Rng & Units 04/29/2016 04/21/2016 04/15/2016  WBC 4.0 - 10.3 10e3/uL 5.8 3.8(L) 5.8  Hemoglobin 13.0 - 17.1 g/dL 10.2(L) 9.7(L) 9.8(L)  Hematocrit 38.4 - 49.9 % 30.4(L) 27.3(L) 29.6(L)  Platelets 140 - 400 10e3/uL 242 121(L) 174    . CMP Latest Ref Rng & Units 04/29/2016 04/21/2016 04/15/2016  Glucose 70 - 140 mg/dl 517(H) 143(H) 219(H)  BUN 7.0 - 26.0 mg/dL 13.6 21(H) 23.1  Creatinine 0.7 - 1.3 mg/dL 1.6(H) 1.49(H) 1.9(H)  Sodium 136 - 145 mEq/L 138 140 138  Potassium 3.5 - 5.1 mEq/L 4.3 3.1(L) 4.4  Chloride 101 - 111 mmol/L - 109 -  CO2 22 - 29 mEq/L _0 Calcium 8.4 - 10.4 mg/dL 8.9 8.8(L) 9.5  Total Protein 6.4 - 8.3 g/dL 7.6 - 7.9  Total Bilirubin 0.20 - 1.20 mg/dL 0.23 - 0.33  Alkaline Phos 40 - 150 U/L 99 - 74  AST 5 - 34 U/L 106(H) - 21  ALT 0 - 55 U/L 171(H) - 36     RADIOGRAPHIC STUDIES: I have personally reviewed the radiological images as listed and  agreed with the findings in the report. Dg Orthopantogram  Result Date: 04/02/2016 CLINICAL DATA:  Infected tooth. EXAM: ORTHOPANTOGRAM/PANORAMIC COMPARISON:  None. FINDINGS: No fracture is noted. Extremely poor dentition is noted. There is noted lucency surrounding the root of a right molar in the mandible which may represent infection. IMPRESSION: Extremely poor dentition is noted. No fracture is noted. Lucency surrounding root of molar and right mandible concerning for possible infection. Electronically Signed   By: Marijo Conception, M.D.   On: 04/02/2016 16:29   Ct Renal Stone Study  Result Date: 04/21/2016 CLINICAL DATA:  71 year old hypertensive male with left flank pain starting last night. Abdominal bloating. New diagnosis of multiple myeloma 3 weeks ago. Chemotherapy in progress. Initial encounter. EXAM: CT ABDOMEN AND PELVIS WITHOUT CONTRAST TECHNIQUE: Multidetector CT imaging of the abdomen and pelvis was performed following the standard protocol without IV contrast. COMPARISON:  03/02/2016 CT and ultrasound. FINDINGS: Lower chest: Tiny granuloma left lung base. Minimal basilar atelectatic changes. Coronary artery calcifications suspected.  Heart size top-normal. Hepatobiliary: Taking into account limitation by non contrast imaging, no hepatic lesion. No calcified gallstone. Gallbladder sludge or noncalcified stones noted. Pancreas: Taking into account limitation by non contrast imaging, no mass or focal inflammation. Slight third spacing of fluid limits evaluation. Spleen: Taking into account limitation by non contrast imaging, no mass or enlargement. Adrenals/Urinary Tract: No renal or ureteral obstructing stone or evidence hydronephrosis. Taking into account limitation by non contrast imaging, no renal or adrenal lesion. Mild impression upon the bladder base by slightly lobulated prostate gland. Stomach/Bowel: Scattered diverticula without surrounding inflammation. Mid to distal appendix  top-normal in size with minimal haziness of adjacent fat planes. In the proper clinical setting, very early appendicitis cannot be excluded based on imaging. This represents a change from prior CT. Portions of stomach, small bowel and colon are under distended and evaluation limited. Vascular/Lymphatic: Atherosclerotic changes aorta, iliac arteries, femoral arteries and aortic branch vessels without aneurysm. Scattered small lymph nodes. Reproductive: Enlarged slightly lobulated prostate gland with impression upon the bladder base. Clinical laboratory correlation recommended. Other: Mild third spacing of fluid. Musculoskeletal: Subtle lucency left aspect L3 vertebral body (Series 2, image 34) may represent result of myeloma. Mild degenerative changes lumbar spine and hip joints. Small well-circumscribed lucency right intertrochanteric region felt to be incidental finding. IMPRESSION: No renal or ureteral obstructing stone or evidence hydronephrosis. Mid to distal appendix top-normal in size with minimal haziness of adjacent fat planes. In the proper clinical setting, very early appendicitis cannot be excluded. The minimal haziness of fat planes may be related to third spacing of fluid which is new from the prior exam. Scattered colonic diverticula without surrounding inflammation. Gallbladder sludge/ noncalcified stones noted. Subtle lucency left aspect L3 vertebral body (Series 2, image 34) may represent result of myeloma. Slightly lobulated prostate gland impresses upon the bladder base. Clinical and laboratory correlation recommended. Aortic atherosclerosis. Coronary artery calcifications. Call is into Dr. Zenia Resides (via [facility] Haw) currently on phone. Electronically Signed   By: Genia Del M.D.   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 Anemia + Renal insufficiency. Bone  survey neg for concerning bone lesions. significantly elevated kappafree light chains 858 mg percent, lambda 18.4, with a ratio of 46.65 . He was also noted to have an M spike of 1.7 g/dL with IFE showing IgG kappa monoclonal protein. Quantitative IgG level was increased to nearly 2500 mg/dL. 24-hour UPEP showed monoclonal protein of 1300 mg per 24 hours which constituted about 86% of all the urinary protein. 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 Had an element of obstructive uropathy due to BPH which has resolved. Creatinine continues to improve from 3.5 to 2.9 to 1.9 to 1.6 3) Normocytic anemia likely related to multiple myeloma.-stable, improving 4) Elevated transaminases today - ?related to cyclophosphamide  vs metabolic insult from severe hypoglycemia. No abdominal pain or discomfort over the right upper quadrant. No fevers or chills. Plan -Patient has no prohibitive toxicities from his CyBorD treatment thus far - labs today showed improving anemia and stable renal function but noted to have elevated transaminases. Given the elevated transaminases severe uncontrolled blood sugars including severe hypoglycemia followed by significant hyperglycemia will hold off on Velcade dose today. -he will come back on cycle 2 day 4 with repeat labs and if his liver functions are improving we'll continue his treatment as per plan. -Continue follow-up with nephrology- patient is following with Dr Ila Mcgill. -No indication for PRBC transfusion at this time.  4) Poor dentition with right mandibular dental abscess . Patient is completed course of clindamycin and noted that his tooth pain has resolved . Plan  -Continue Peridex mouthwash twice daily -given refill . -He notes that he has plans for follow-up with a dentist that's within his  insurance network. He was recommended to see one as soon as possible to address his poor dentition and any possible residual dental abscess. No active discomfort at this time.   5) DM2 - poorly controlled with multiple episodes of significant hypoglycemia. Had blood sugar 23 requiring EMS treatment today. -It appears that he has been erroneously taking the glipizide in addition to his Lantus and NovoLog. -He was recommended to call his primary care physician urgently to ensure he has an appropriate plan for his diabetes management. -Given his severe blood sugar fluctuations and metabolic changes today we will hold off on treatment with Velcade today. -he has been recommended to discontinue his glipizide. -Would recommend minimal needed basal Lantus insulin and NovoLog sliding scale. If there is predictable blood sugars could schedule some of the NovoLog pre meals. -Patient is aware that his diabetes management will be primarily driven by his primary care physician - continue management as per primary care physician   Continue treatment as per schedule. He will need repeat labs on day 4 prior to his Velcade to check his liver function tests again. RTC with Dr Irene Limbo [phone] with labs Continue labs D1 and D8 every cycle  I spent 25 minutes counseling the patient face to face. The total time spent in the appointment was 30 minutes and more than 50% was on counseling and direct patient cares.    Sullivan Lone MD [place] [facility] Hematology/Oncology Physician [facility]  (Office):       [phone] (Work cell):  [phone] (Fax):           [phone]

## 2016-04-30 LAB — KAPPA/LAMBDA LIGHT CHAINS
IG KAPPA FREE LIGHT CHAIN: 14.2 mg/L (ref 3.3–19.4)
Ig Lambda Free Light Chain: 9.8 mg/L (ref 5.7–26.3)
Kappa/Lambda FluidC Ratio: 1.45 (ref 0.26–1.65)

## 2016-05-01 ENCOUNTER — Other Ambulatory Visit: Payer: Self-pay | Admitting: *Deleted

## 2016-05-01 ENCOUNTER — Telehealth: Payer: Self-pay | Admitting: *Deleted

## 2016-05-01 NOTE — Telephone Encounter (Signed)
Call received from patient's wife to see if patient should be taking Decadron at this time. Per Dr. Grier Mitts office visit note, Velcade was held on 04/29/16 d/t elevated glucose and liver enzymes.  Instructed patient's wife to hold Decadron at this time until lab results are back tomorrow and patient's nurse in infusion will give further instructions at that time regarding Decadron. Patient's wife appreciative of assistance and has no further questions at this time.

## 2016-05-02 ENCOUNTER — Other Ambulatory Visit (HOSPITAL_BASED_OUTPATIENT_CLINIC_OR_DEPARTMENT_OTHER): Payer: Medicare HMO

## 2016-05-02 ENCOUNTER — Encounter: Payer: Self-pay | Admitting: *Deleted

## 2016-05-02 ENCOUNTER — Ambulatory Visit (HOSPITAL_BASED_OUTPATIENT_CLINIC_OR_DEPARTMENT_OTHER): Payer: Medicare HMO

## 2016-05-02 VITALS — BP 159/86 | HR 76 | Temp 97.6°F | Resp 16

## 2016-05-02 DIAGNOSIS — C9000 Multiple myeloma not having achieved remission: Secondary | ICD-10-CM

## 2016-05-02 DIAGNOSIS — Z5112 Encounter for antineoplastic immunotherapy: Secondary | ICD-10-CM

## 2016-05-02 LAB — COMPREHENSIVE METABOLIC PANEL
ALBUMIN: 3.6 g/dL (ref 3.5–5.0)
ALK PHOS: 81 U/L (ref 40–150)
ALT: 142 U/L — ABNORMAL HIGH (ref 0–55)
ANION GAP: 8 meq/L (ref 3–11)
AST: 47 U/L — ABNORMAL HIGH (ref 5–34)
BILIRUBIN TOTAL: 0.37 mg/dL (ref 0.20–1.20)
BUN: 16 mg/dL (ref 7.0–26.0)
CALCIUM: 8.9 mg/dL (ref 8.4–10.4)
CO2: 26 mEq/L (ref 22–29)
Chloride: 103 mEq/L (ref 98–109)
Creatinine: 1.6 mg/dL — ABNORMAL HIGH (ref 0.7–1.3)
EGFR: 49 mL/min/{1.73_m2} — AB (ref >90)
Glucose: 262 mg/dl — ABNORMAL HIGH (ref 70–140)
POTASSIUM: 3.6 meq/L (ref 3.5–5.1)
SODIUM: 137 meq/L (ref 136–145)
TOTAL PROTEIN: 7.4 g/dL (ref 6.4–8.3)

## 2016-05-02 LAB — CBC & DIFF AND RETIC
BASO%: 0 % (ref 0.0–2.0)
BASOS ABS: 0 10*3/uL (ref 0.0–0.1)
EOS ABS: 0 10*3/uL (ref 0.0–0.5)
EOS%: 0.5 % (ref 0.0–7.0)
HEMATOCRIT: 29.3 % — AB (ref 38.4–49.9)
HGB: 10 g/dL — ABNORMAL LOW (ref 13.0–17.1)
Immature Retic Fract: 1.7 % — ABNORMAL LOW (ref 3.00–10.60)
LYMPH%: 10.4 % — AB (ref 14.0–49.0)
MCH: 28.2 pg (ref 27.2–33.4)
MCHC: 34.1 g/dL (ref 32.0–36.0)
MCV: 82.8 fL (ref 79.3–98.0)
MONO#: 0.3 10*3/uL (ref 0.1–0.9)
MONO%: 5.9 % (ref 0.0–14.0)
NEUT#: 3.5 10*3/uL (ref 1.5–6.5)
NEUT%: 83.2 % — ABNORMAL HIGH (ref 39.0–75.0)
PLATELETS: 214 10*3/uL (ref 140–400)
RBC: 3.54 10*6/uL — ABNORMAL LOW (ref 4.20–5.82)
RDW: 17 % — AB (ref 11.0–14.6)
Retic %: 1.64 % (ref 0.80–1.80)
Retic Ct Abs: 58.06 10*3/uL (ref 34.80–93.90)
WBC: 4.3 10*3/uL (ref 4.0–10.3)
lymph#: 0.4 10*3/uL — ABNORMAL LOW (ref 0.9–3.3)

## 2016-05-02 MED ORDER — BORTEZOMIB CHEMO SQ INJECTION 3.5 MG (2.5MG/ML)
1.3000 mg/m2 | Freq: Once | INTRAMUSCULAR | Status: AC
  Administered 2016-05-02: 2.5 mg via SUBCUTANEOUS
  Filled 2016-05-02: qty 2.5

## 2016-05-02 MED ORDER — PROCHLORPERAZINE MALEATE 10 MG PO TABS
10.0000 mg | ORAL_TABLET | Freq: Once | ORAL | Status: AC
  Administered 2016-05-02: 10 mg via ORAL

## 2016-05-02 MED ORDER — PROCHLORPERAZINE MALEATE 10 MG PO TABS
ORAL_TABLET | ORAL | Status: AC
  Filled 2016-05-02: qty 1

## 2016-05-02 NOTE — Patient Instructions (Signed)
[facility] Discharge Instructions for Patients Receiving Chemotherapy  Today you received the following chemotherapy agents Velcade. To help prevent nausea and vomiting after your treatment, we encourage you to take your nausea medication as directed.  If you develop nausea and vomiting that is not controlled by your nausea medication, call the clinic.   BELOW ARE SYMPTOMS THAT SHOULD BE REPORTED IMMEDIATELY:  *FEVER GREATER THAN 100.5 F  *CHILLS WITH OR WITHOUT FEVER  NAUSEA AND VOMITING THAT IS NOT CONTROLLED WITH YOUR NAUSEA MEDICATION  *UNUSUAL SHORTNESS OF BREATH  *UNUSUAL BRUISING OR BLEEDING  TENDERNESS IN MOUTH AND THROAT WITH OR WITHOUT PRESENCE OF ULCERS  *URINARY PROBLEMS  *BOWEL PROBLEMS  UNUSUAL RASH Items with * indicate a potential emergency and should be followed up as soon as possible.  Feel free to call the clinic you have any questions or concerns. The clinic phone number is [phone].  Please show the CHEMO ALERT CARD at check-in to the Emergency Department and triage nurse.    

## 2016-05-02 NOTE — Progress Notes (Signed)
OK to treat with CMET 1.6

## 2016-05-05 LAB — MULTIPLE MYELOMA PANEL, SERUM
ALBUMIN/GLOB SERPL: 1.2 (ref 0.7–1.7)
ALPHA 1: 0.2 g/dL (ref 0.0–0.4)
ALPHA2 GLOB SERPL ELPH-MCNC: 0.7 g/dL (ref 0.4–1.0)
Albumin SerPl Elph-Mcnc: 3.7 g/dL (ref 2.9–4.4)
B-Globulin SerPl Elph-Mcnc: 0.8 g/dL (ref 0.7–1.3)
GAMMA GLOB SERPL ELPH-MCNC: 1.6 g/dL (ref 0.4–1.8)
GLOBULIN, TOTAL: 3.3 g/dL (ref 2.2–3.9)
IGA/IMMUNOGLOBULIN A, SERUM: 83 mg/dL (ref 61–437)
IgG, Qn, Serum: 1707 mg/dL — ABNORMAL HIGH (ref 700–1600)
IgM, Qn, Serum: 28 mg/dL (ref 15–143)
M Protein SerPl Elph-Mcnc: 1 g/dL — ABNORMAL HIGH
Total Protein: 7 g/dL (ref 6.0–8.5)

## 2016-05-06 ENCOUNTER — Ambulatory Visit (HOSPITAL_BASED_OUTPATIENT_CLINIC_OR_DEPARTMENT_OTHER): Payer: Medicare HMO

## 2016-05-06 ENCOUNTER — Ambulatory Visit: Payer: Medicare HMO | Admitting: Nutrition

## 2016-05-06 ENCOUNTER — Other Ambulatory Visit (HOSPITAL_BASED_OUTPATIENT_CLINIC_OR_DEPARTMENT_OTHER): Payer: Medicare HMO

## 2016-05-06 VITALS — BP 148/80 | HR 70 | Temp 97.7°F | Resp 16

## 2016-05-06 DIAGNOSIS — C9000 Multiple myeloma not having achieved remission: Secondary | ICD-10-CM | POA: Diagnosis not present

## 2016-05-06 DIAGNOSIS — Z5112 Encounter for antineoplastic immunotherapy: Secondary | ICD-10-CM | POA: Diagnosis not present

## 2016-05-06 LAB — COMPREHENSIVE METABOLIC PANEL
ALK PHOS: 75 U/L (ref 40–150)
ALT: 118 U/L — AB (ref 0–55)
ANION GAP: 6 meq/L (ref 3–11)
AST: 22 U/L (ref 5–34)
Albumin: 3.4 g/dL — ABNORMAL LOW (ref 3.5–5.0)
BILIRUBIN TOTAL: 0.39 mg/dL (ref 0.20–1.20)
BUN: 19.2 mg/dL (ref 7.0–26.0)
CHLORIDE: 104 meq/L (ref 98–109)
CO2: 27 meq/L (ref 22–29)
Calcium: 9.1 mg/dL (ref 8.4–10.4)
Creatinine: 1.5 mg/dL — ABNORMAL HIGH (ref 0.7–1.3)
EGFR: 52 mL/min/{1.73_m2} — ABNORMAL LOW (ref >90)
Glucose: 289 mg/dl — ABNORMAL HIGH (ref 70–140)
POTASSIUM: 4 meq/L (ref 3.5–5.1)
Sodium: 138 mEq/L (ref 136–145)
Total Protein: 6.9 g/dL (ref 6.4–8.3)

## 2016-05-06 LAB — CBC & DIFF AND RETIC
BASO%: 0 % (ref 0.0–2.0)
Basophils Absolute: 0 10*3/uL (ref 0.0–0.1)
EOS%: 0.2 % (ref 0.0–7.0)
Eosinophils Absolute: 0 10*3/uL (ref 0.0–0.5)
HCT: 29.2 % — ABNORMAL LOW (ref 38.4–49.9)
HGB: 9.8 g/dL — ABNORMAL LOW (ref 13.0–17.1)
Immature Retic Fract: 4.4 % (ref 3.00–10.60)
LYMPH#: 0.3 10*3/uL — AB (ref 0.9–3.3)
LYMPH%: 6 % — ABNORMAL LOW (ref 14.0–49.0)
MCH: 27.8 pg (ref 27.2–33.4)
MCHC: 33.6 g/dL (ref 32.0–36.0)
MCV: 82.7 fL (ref 79.3–98.0)
MONO#: 0.1 10*3/uL (ref 0.1–0.9)
MONO%: 3 % (ref 0.0–14.0)
NEUT#: 3.9 10*3/uL (ref 1.5–6.5)
NEUT%: 90.8 % — ABNORMAL HIGH (ref 39.0–75.0)
PLATELETS: 171 10*3/uL (ref 140–400)
RBC: 3.53 10*6/uL — AB (ref 4.20–5.82)
RDW: 16.9 % — AB (ref 11.0–14.6)
RETIC CT ABS: 26.48 10*3/uL — AB (ref 34.80–93.90)
Retic %: 0.75 % — ABNORMAL LOW (ref 0.80–1.80)
WBC: 4.3 10*3/uL (ref 4.0–10.3)

## 2016-05-06 MED ORDER — BORTEZOMIB CHEMO SQ INJECTION 3.5 MG (2.5MG/ML)
1.3000 mg/m2 | Freq: Once | INTRAMUSCULAR | Status: AC
  Administered 2016-05-06: 2.5 mg via SUBCUTANEOUS
  Filled 2016-05-06: qty 2.5

## 2016-05-06 MED ORDER — PROCHLORPERAZINE MALEATE 10 MG PO TABS
ORAL_TABLET | ORAL | Status: AC
  Filled 2016-05-06: qty 1

## 2016-05-06 MED ORDER — PROCHLORPERAZINE MALEATE 10 MG PO TABS
10.0000 mg | ORAL_TABLET | Freq: Once | ORAL | Status: AC
  Administered 2016-05-06: 10 mg via ORAL

## 2016-05-06 NOTE — Progress Notes (Signed)
71 year old male diagnosed with multiple myeloma receiving chemotherapy.  Past medical history includes hypertension, hyperlipidemia, non-insulin-dependent diabetes, CAD.  Medications include Lipitor, Decadron, Glucotrol, Lantus, NovoLog.  Height: 5 feet 11 inches. Weight: 167.8 pounds on April 24. Usual body weight: 160 pounds. BMI: 20 3.  Patient reports he is eating well and has a good appetite. He denies nutrition impact symptoms from chemotherapy. Wife would like clarification on reducing total carbohydrates and salt in his diet in the setting of new cancer diagnosis.  Nutrition diagnosis:  Food and nutrition related knowledge deficit related to multiple myeloma and associated treatments as evidenced by no prior need for nutrition related information.  Intervention: Educated patient and wife on the importance of eating a controlled carbohydrate diet.  Small frequent meals and snacks which contain complex carbohydrates and adequate protein. Encouraged patient to continue taking blood sugars and communicate with his physician regarding abnormal CBGs Educated patient's wife on seasoning without salt and provided information on processed foods which have a lot of sodium in them. Questions were answered.  Teach back method used.  Contact information provided.  Monitoring, evaluation, goals: Patient will tolerate healthy plant-based diet with adequate protein and calories to maintain weight.  No follow-up scheduled; nutrition diagnosis resolved.  **Disclaimer: This note was dictated with voice recognition software. Similar sounding words can inadvertently be transcribed and this note may contain transcription errors which may not have been corrected upon publication of note.**

## 2016-05-06 NOTE — Patient Instructions (Signed)
[facility] Discharge Instructions for Patients Receiving Chemotherapy  Today you received the following chemotherapy agents Velcade. To help prevent nausea and vomiting after your treatment, we encourage you to take your nausea medication as directed.  If you develop nausea and vomiting that is not controlled by your nausea medication, call the clinic.   BELOW ARE SYMPTOMS THAT SHOULD BE REPORTED IMMEDIATELY:  *FEVER GREATER THAN 100.5 F  *CHILLS WITH OR WITHOUT FEVER  NAUSEA AND VOMITING THAT IS NOT CONTROLLED WITH YOUR NAUSEA MEDICATION  *UNUSUAL SHORTNESS OF BREATH  *UNUSUAL BRUISING OR BLEEDING  TENDERNESS IN MOUTH AND THROAT WITH OR WITHOUT PRESENCE OF ULCERS  *URINARY PROBLEMS  *BOWEL PROBLEMS  UNUSUAL RASH Items with * indicate a potential emergency and should be followed up as soon as possible.  Feel free to call the clinic you have any questions or concerns. The clinic phone number is [phone].  Please show the CHEMO ALERT CARD at check-in to the Emergency Department and triage nurse.    

## 2016-05-09 ENCOUNTER — Ambulatory Visit (HOSPITAL_BASED_OUTPATIENT_CLINIC_OR_DEPARTMENT_OTHER): Payer: Medicare HMO

## 2016-05-09 DIAGNOSIS — Z5112 Encounter for antineoplastic immunotherapy: Secondary | ICD-10-CM

## 2016-05-09 DIAGNOSIS — C9000 Multiple myeloma not having achieved remission: Secondary | ICD-10-CM | POA: Diagnosis not present

## 2016-05-09 MED ORDER — PROCHLORPERAZINE MALEATE 10 MG PO TABS
10.0000 mg | ORAL_TABLET | Freq: Once | ORAL | Status: AC
  Administered 2016-05-09: 10 mg via ORAL

## 2016-05-09 MED ORDER — BORTEZOMIB CHEMO SQ INJECTION 3.5 MG (2.5MG/ML)
1.3000 mg/m2 | Freq: Once | INTRAMUSCULAR | Status: AC
  Administered 2016-05-09: 2.5 mg via SUBCUTANEOUS
  Filled 2016-05-09: qty 2.5

## 2016-05-09 MED ORDER — PROCHLORPERAZINE MALEATE 10 MG PO TABS
ORAL_TABLET | ORAL | Status: AC
  Filled 2016-05-09: qty 1

## 2016-05-09 NOTE — Patient Instructions (Signed)
[facility] Discharge Instructions for Patients Receiving Chemotherapy  Today you received the following chemotherapy agents Velcade. To help prevent nausea and vomiting after your treatment, we encourage you to take your nausea medication as directed.  If you develop nausea and vomiting that is not controlled by your nausea medication, call the clinic.   BELOW ARE SYMPTOMS THAT SHOULD BE REPORTED IMMEDIATELY:  *FEVER GREATER THAN 100.5 F  *CHILLS WITH OR WITHOUT FEVER  NAUSEA AND VOMITING THAT IS NOT CONTROLLED WITH YOUR NAUSEA MEDICATION  *UNUSUAL SHORTNESS OF BREATH  *UNUSUAL BRUISING OR BLEEDING  TENDERNESS IN MOUTH AND THROAT WITH OR WITHOUT PRESENCE OF ULCERS  *URINARY PROBLEMS  *BOWEL PROBLEMS  UNUSUAL RASH Items with * indicate a potential emergency and should be followed up as soon as possible.  Feel free to call the clinic you have any questions or concerns. The clinic phone number is [phone].  Please show the CHEMO ALERT CARD at check-in to the Emergency Department and triage nurse.    

## 2016-05-10 ENCOUNTER — Encounter (HOSPITAL_COMMUNITY): Payer: Self-pay | Admitting: Emergency Medicine

## 2016-05-10 ENCOUNTER — Emergency Department (HOSPITAL_COMMUNITY)
Admission: EM | Admit: 2016-05-10 | Discharge: 2016-05-11 | Disposition: A | Discharge status: Home / Self Care | Payer: Medicare HMO | Source: Home / Self Care | Attending: Emergency Medicine | Admitting: Emergency Medicine

## 2016-05-10 ENCOUNTER — Other Ambulatory Visit: Payer: Self-pay

## 2016-05-10 DIAGNOSIS — E1165 Type 2 diabetes mellitus with hyperglycemia: Secondary | ICD-10-CM

## 2016-05-10 DIAGNOSIS — Z794 Long term (current) use of insulin: Secondary | ICD-10-CM

## 2016-05-10 DIAGNOSIS — R739 Hyperglycemia, unspecified: Secondary | ICD-10-CM

## 2016-05-10 DIAGNOSIS — Z7982 Long term (current) use of aspirin: Secondary | ICD-10-CM

## 2016-05-10 DIAGNOSIS — I10 Essential (primary) hypertension: Secondary | ICD-10-CM | POA: Insufficient documentation

## 2016-05-10 DIAGNOSIS — Z79899 Other long term (current) drug therapy: Secondary | ICD-10-CM

## 2016-05-10 DIAGNOSIS — E860 Dehydration: Secondary | ICD-10-CM | POA: Insufficient documentation

## 2016-05-10 DIAGNOSIS — I2510 Atherosclerotic heart disease of native coronary artery without angina pectoris: Secondary | ICD-10-CM

## 2016-05-10 DIAGNOSIS — A414 Sepsis due to anaerobes: Secondary | ICD-10-CM | POA: Diagnosis not present

## 2016-05-10 HISTORY — DX: Type 2 diabetes mellitus without complications: E11.9

## 2016-05-10 LAB — COMPREHENSIVE METABOLIC PANEL
ALBUMIN: 2.8 g/dL — AB (ref 3.5–5.0)
ALT: 42 U/L (ref 17–63)
ANION GAP: 12 (ref 5–15)
AST: 19 U/L (ref 15–41)
Alkaline Phosphatase: 48 U/L (ref 38–126)
BUN: 18 mg/dL (ref 6–20)
CO2: 20 mmol/L — AB (ref 22–32)
Calcium: 8.4 mg/dL — ABNORMAL LOW (ref 8.9–10.3)
Chloride: 102 mmol/L (ref 101–111)
Creatinine, Ser: 1.67 mg/dL — ABNORMAL HIGH (ref 0.61–1.24)
GFR calc Af Amer: 46 mL/min — ABNORMAL LOW (ref >60)
GFR calc non Af Amer: 39 mL/min — ABNORMAL LOW (ref >60)
Glucose, Bld: 310 mg/dL — ABNORMAL HIGH (ref 65–99)
POTASSIUM: 3.3 mmol/L — AB (ref 3.5–5.1)
SODIUM: 134 mmol/L — AB (ref 135–145)
Total Bilirubin: 0.8 mg/dL (ref 0.3–1.2)
Total Protein: 5.8 g/dL — ABNORMAL LOW (ref 6.5–8.1)

## 2016-05-10 LAB — CBC
HCT: 31.2 % — ABNORMAL LOW (ref 39.0–52.0)
Hemoglobin: 10.6 g/dL — ABNORMAL LOW (ref 13.0–17.0)
MCH: 27.5 pg (ref 26.0–34.0)
MCHC: 34 g/dL (ref 30.0–36.0)
MCV: 80.8 fL (ref 78.0–100.0)
Platelets: 126 10*3/uL — ABNORMAL LOW (ref 150–400)
RBC: 3.86 MIL/uL — ABNORMAL LOW (ref 4.22–5.81)
RDW: 16.9 % — AB (ref 11.5–15.5)
WBC: 6 10*3/uL (ref 4.0–10.5)

## 2016-05-10 NOTE — ED Notes (Signed)
Assisted MD with rectal exam

## 2016-05-10 NOTE — ED Notes (Signed)
Pt unable to void at this time, 

## 2016-05-10 NOTE — ED Notes (Signed)
Called for room, no response.  

## 2016-05-10 NOTE — ED Provider Notes (Signed)
[facility] DEPT Provider Note   CSN: [id number] Arrival date & time: 05/10/16  2233  By signing my name below, I, Douglas Rose, attest that this documentation has been prepared under the direction and in the presence of Douglas Fraise, MD . Electronically Signed: Levester Rose, Scribe. 05/11/2016. 1:08 AM.  History   Chief Complaint Chief Complaint  Patient presents with  . Abdominal Pain  . Fecal Impaction    Douglas Rose. is a 71 y.o. male, currently undergoing chemo for multiple myleoma, who presents to the Emergency Department with complaints of abdominal pain 2/2 constipation, x2 days. Pt feels as if he may be impacted.   Pt had a small BM PTA, but was instructed to come to the ED to r/o obstruction.   The history is provided by the patient. No language interpreter was used.  Abdominal Pain   This is a new problem. The current episode started 2 days ago. The problem occurs daily. The problem has not changed since onset.The pain is associated with an unknown factor. The pain is at a severity of 7/10. The pain is moderate. Associated symptoms include constipation. Pertinent negatives include nausea and vomiting. Nothing aggravates the symptoms. Nothing relieves the symptoms.   Past Medical History:  Diagnosis Date  . CAD (coronary artery disease) 2006   stent LAD 3.0x18 [place], occluded RCA  . Cancer ([place])    multiple myeloma  . Diabetes mellitus without complication ([place])   . H/O non-insulin dependent diabetes mellitus   . Hyperlipidemia   . Hypertension     Patient Active Problem List   Diagnosis Date Noted  . Multiple myeloma ([name]) 03/20/2016  . Acute renal failure ([place]) 03/06/2016  . Monoclonal gammopathy   . Protein-calorie malnutrition, severe 03/03/2016  . Pancreatitis 03/02/2016  . Enlarged prostate 03/02/2016  . Upper abdominal pain   . Urinary retention   . Acute renal failure (ARF) ([place]) 03/01/2016  . Abdominal fullness 02/26/2016  .  Lymphadenopathy 11/21/2015  . Lipoma 07/19/2014  . Coronary artery disease   . Hyperlipidemia   . Hypertension   . H/O non-insulin dependent diabetes mellitus     Past Surgical History:  Procedure Laterality Date  . CARDIAC CATHETERIZATION  2006       Home Medications    Prior to Admission medications   Medication Sig Start Date End Date Taking? Authorizing Provider  acyclovir (ZOVIRAX) 200 MG capsule Take 1 capsule (200 mg total) by mouth 2 (two) times daily. 04/08/16   Brunetta Genera, MD  amLODipine (NORVASC) 10 MG tablet Take 10 mg by mouth daily.    [provider]  aspirin 81 MG tablet Take 81 mg by mouth daily.    [provider]  atorvastatin (LIPITOR) 20 MG tablet Take 1 tablet (20 mg total) by mouth daily. 12/06/12   Burtis Junes, NP  chlorhexidine (PERIDEX) 0.12 % solution Use as directed 15 mLs in the mouth or throat 2 (two) times daily. 04/15/16   Brunetta Genera, MD  cyclophosphamide (CYTOXAN) 50 MG tablet Take 12 tablets (600 mg total) by mouth once a week. Take days 1,8,15 of chemo 1hr before or 2hr after meals every 21d. Capsules. ICD:C90.00 04/03/16   Brunetta Genera, MD  dexamethasone (DECADRON) 4 MG tablet Take 5 tablets (20 mg) on days 1, 8, and 15 of chemo. Repeat every 21 days. Take 2 tablets (62m) daily for 2 days after days1 and 8 of chemo 03/31/16   KBrunetta Genera  MD  insulin aspart (NOVOLOG) 100 UNIT/ML FlexPen Three times a day before meals per sliding scale. between 2-6 on average 04/09/16   [provider]  Insulin Glargine (LANTUS SOLOSTAR) 100 UNIT/ML Solostar Pen Inject 5 Units into the skin daily.  03/25/16   [provider]  metoprolol succinate (TOPROL-XL) 25 MG 24 hr tablet Take 3 tablets (75 mg total) by mouth daily. Take with or immediately following a meal. 03/05/16   Velna Ochs, MD  nitroGLYCERIN (NITROSTAT) 0.4 MG SL tablet Place 1 tablet (0.4 mg total) under the tongue every 5 (five)  minutes x 3 doses as needed for chest pain. 11/13/12   Edmisten, Brooke O, PA-C  ondansetron (ZOFRAN) 8 MG tablet Take 1 tablet (8 mg total) by mouth 2 (two) times daily as needed for refractory nausea / vomiting. 03/31/16   Brunetta Genera, MD  prochlorperazine (COMPAZINE) 10 MG tablet Take 1 tablet (10 mg total) by mouth every 6 (six) hours as needed (Nausea or vomiting). 03/31/16   Brunetta Genera, MD  tamsulosin (FLOMAX) 0.4 MG CAPS capsule Take 1 capsule (0.4 mg total) by mouth daily after supper. 03/04/16   Velna Ochs, MD    Family History Family History  Problem Relation Age of Onset  . Arrhythmia Mother   . Diabetes Sister   . Diabetes Brother   . Diabetes Brother     Social History Social History  Substance Use Topics  . Smoking status: Never Smoker  . Smokeless tobacco: Never Used  . Alcohol use No     Allergies   Patient has no known allergies.   Review of Systems Review of Systems  Respiratory: Negative for shortness of breath.   Gastrointestinal: Positive for abdominal pain and constipation. Negative for nausea and vomiting.  All other systems reviewed and are negative.   Physical Exam Updated Vital Signs BP 120/86 (BP Location: Right Arm)   Pulse (!) 112   Temp 99.8 F (37.7 C) (Oral)   Resp 16   SpO2 100%   Physical Exam CONSTITUTIONAL: Elderly, no acute distress HEAD: Normocephalic/atraumatic EYES: EOMI/PERRL ENMT: Mucous membranes moist NECK: supple no meningeal signs SPINE/BACK:entire spine nontender CV: S1/S2 noted, no murmurs/rubs/gallops noted LUNGS: Lungs are clear to auscultation bilaterally, no apparent distress ABDOMEN: soft, nontender, no rebound or guarding, bowel sounds noted throughout abdomen RECTAL: No stool impaction noted. No blood or melena. No abscess. Chaperone present. GU:no cva tenderness NEURO: Pt is awake/alert/appropriate, moves all extremitiesx4.  No facial droop.   EXTREMITIES: pulses normal/equal, full  ROM SKIN: warm, color normal PSYCH: no abnormalities of mood noted, alert and oriented to situation   ED Treatments / Results  DIAGNOSTIC STUDIES: Oxygen Saturation is 100% on RA, nl by my interpretation.    COORDINATION OF CARE: 11:55 PM Discussed treatment plan with pt at bedside and pt agreed to plan.  1:08 AM Updated pt and family on labs and imaging results.   Labs (all labs ordered are listed, but only abnormal results are displayed) Labs Reviewed  CBC - Abnormal; Notable for the following:       Result Value   RBC 3.86 (*)    Hemoglobin 10.6 (*)    HCT 31.2 (*)    RDW 16.9 (*)    Platelets 126 (*)    All other components within normal limits  COMPREHENSIVE METABOLIC PANEL - Abnormal; Notable for the following:    Sodium 134 (*)    Potassium 3.3 (*)    CO2 20 (*)  Glucose, Bld 310 (*)    Creatinine, Ser 1.67 (*)    Calcium 8.4 (*)    Total Protein 5.8 (*)    Albumin 2.8 (*)    GFR calc non Af Amer 39 (*)    GFR calc Af Amer 46 (*)    All other components within normal limits    EKG  EKG Interpretation  Date/Time:  Saturday May 10 2016 23:20:00 EDT Ventricular Rate:  105 PR Interval:    QRS Duration: 94 QT Interval:  333 QTC Calculation: 441 R Axis:   93 Text Interpretation:  Sinus tachycardia Probable left atrial enlargement Right axis deviation Interpretation limited secondary to artifact Confirmed by Douglas Rose [phone]) on 05/11/2016 12:07:52 AM       Radiology Dg Abd Acute W/chest  Result Date: 05/11/2016 CLINICAL DATA:  Abdominal pain and constipation EXAM: DG ABDOMEN ACUTE W/ 1V CHEST COMPARISON:  None. FINDINGS: There is a generous volume of air an small bowel, more likely nonobstructive. No extraluminal gas. Stool and air is present in the colon. No biliary or urinary calculi. Upright view of the chest is negative for acute cardiopulmonary abnormality. IMPRESSION: Generous volume of air throughout small bowel. There also is stool and air in  the colon. No evidence of bowel obstruction or perforation. Electronically Signed   By: Andreas Newport M.D.   On: 05/11/2016 00:45    Procedures Procedures   Medications Ordered in ED Medications - No data to display   Initial Impression / Assessment and Plan / ED Course  I have reviewed the triage vital signs and the nursing notes.  Pertinent labs & imaging results that were available during my care of the patient were reviewed by me and considered in my medical decision making (see chart for details).     Pt improved Taking PO He ambulates without diffculty He had NO focal abdominal tenderness He is well appearing He is mildly dehydrated and also mild hyperglycemia without anion gap He would like to be discharged  At this point, no signs of acute obstruction or other acute abdominal emergency Encouraged fluid intake We discussed ER return precautions   Final Clinical Impressions(s) / ED Diagnoses   Final diagnoses:  Dehydration  Hyperglycemia    New Prescriptions Discharge Medication List as of 05/11/2016  3:32 AM     I personally performed the services described in this documentation, which was scribed in my presence. The recorded information has been reviewed and is accurate.       Douglas Fraise, MD 05/11/16 [phone]

## 2016-05-10 NOTE — ED Triage Notes (Signed)
Pt presents with abd pain and constipation; pt reports small BM prior to arrival with small hard pebbles; pt reports hx of impaction; pt also currently getting chemo treatment for multple myeloma

## 2016-05-11 ENCOUNTER — Encounter (HOSPITAL_COMMUNITY): Payer: Self-pay | Admitting: Emergency Medicine

## 2016-05-11 ENCOUNTER — Emergency Department (HOSPITAL_COMMUNITY): Payer: Medicare HMO

## 2016-05-11 ENCOUNTER — Other Ambulatory Visit: Payer: Self-pay

## 2016-05-11 ENCOUNTER — Inpatient Hospital Stay (HOSPITAL_COMMUNITY)
Admission: EM | Admit: 2016-05-11 | Discharge: 2016-06-01 | DRG: 871 | Disposition: A | Discharge status: Home Health | Payer: Medicare HMO | Attending: Internal Medicine | Admitting: Internal Medicine

## 2016-05-11 DIAGNOSIS — N472 Paraphimosis: Secondary | ICD-10-CM | POA: Diagnosis not present

## 2016-05-11 DIAGNOSIS — E876 Hypokalemia: Secondary | ICD-10-CM | POA: Diagnosis not present

## 2016-05-11 DIAGNOSIS — K219 Gastro-esophageal reflux disease without esophagitis: Secondary | ICD-10-CM | POA: Diagnosis present

## 2016-05-11 DIAGNOSIS — Z7982 Long term (current) use of aspirin: Secondary | ICD-10-CM

## 2016-05-11 DIAGNOSIS — T508X5A Adverse effect of diagnostic agents, initial encounter: Secondary | ICD-10-CM | POA: Diagnosis present

## 2016-05-11 DIAGNOSIS — Z87891 Personal history of nicotine dependence: Secondary | ICD-10-CM

## 2016-05-11 DIAGNOSIS — N179 Acute kidney failure, unspecified: Secondary | ICD-10-CM

## 2016-05-11 DIAGNOSIS — I10 Essential (primary) hypertension: Secondary | ICD-10-CM | POA: Diagnosis present

## 2016-05-11 DIAGNOSIS — R531 Weakness: Secondary | ICD-10-CM

## 2016-05-11 DIAGNOSIS — E43 Unspecified severe protein-calorie malnutrition: Secondary | ICD-10-CM | POA: Diagnosis present

## 2016-05-11 DIAGNOSIS — E1165 Type 2 diabetes mellitus with hyperglycemia: Secondary | ICD-10-CM | POA: Diagnosis present

## 2016-05-11 DIAGNOSIS — R5381 Other malaise: Secondary | ICD-10-CM

## 2016-05-11 DIAGNOSIS — E8770 Fluid overload, unspecified: Secondary | ICD-10-CM | POA: Diagnosis not present

## 2016-05-11 DIAGNOSIS — T451X5A Adverse effect of antineoplastic and immunosuppressive drugs, initial encounter: Secondary | ICD-10-CM | POA: Diagnosis present

## 2016-05-11 DIAGNOSIS — E871 Hypo-osmolality and hyponatremia: Secondary | ICD-10-CM | POA: Diagnosis present

## 2016-05-11 DIAGNOSIS — A414 Sepsis due to anaerobes: Principal | ICD-10-CM | POA: Diagnosis present

## 2016-05-11 DIAGNOSIS — N400 Enlarged prostate without lower urinary tract symptoms: Secondary | ICD-10-CM | POA: Diagnosis present

## 2016-05-11 DIAGNOSIS — R6510 Systemic inflammatory response syndrome (SIRS) of non-infectious origin without acute organ dysfunction: Secondary | ICD-10-CM | POA: Diagnosis present

## 2016-05-11 DIAGNOSIS — I2510 Atherosclerotic heart disease of native coronary artery without angina pectoris: Secondary | ICD-10-CM | POA: Diagnosis present

## 2016-05-11 DIAGNOSIS — D6959 Other secondary thrombocytopenia: Secondary | ICD-10-CM | POA: Diagnosis present

## 2016-05-11 DIAGNOSIS — E1122 Type 2 diabetes mellitus with diabetic chronic kidney disease: Secondary | ICD-10-CM | POA: Diagnosis present

## 2016-05-11 DIAGNOSIS — R64 Cachexia: Secondary | ICD-10-CM | POA: Diagnosis present

## 2016-05-11 DIAGNOSIS — R1084 Generalized abdominal pain: Secondary | ICD-10-CM

## 2016-05-11 DIAGNOSIS — N183 Chronic kidney disease, stage 3 (moderate): Secondary | ICD-10-CM | POA: Diagnosis present

## 2016-05-11 DIAGNOSIS — Z794 Long term (current) use of insulin: Secondary | ICD-10-CM

## 2016-05-11 DIAGNOSIS — Z833 Family history of diabetes mellitus: Secondary | ICD-10-CM

## 2016-05-11 DIAGNOSIS — E872 Acidosis: Secondary | ICD-10-CM | POA: Diagnosis present

## 2016-05-11 DIAGNOSIS — A0472 Enterocolitis due to Clostridium difficile, not specified as recurrent: Secondary | ICD-10-CM

## 2016-05-11 DIAGNOSIS — E785 Hyperlipidemia, unspecified: Secondary | ICD-10-CM | POA: Diagnosis present

## 2016-05-11 DIAGNOSIS — Z6822 Body mass index (BMI) 22.0-22.9, adult: Secondary | ICD-10-CM

## 2016-05-11 DIAGNOSIS — I129 Hypertensive chronic kidney disease with stage 1 through stage 4 chronic kidney disease, or unspecified chronic kidney disease: Secondary | ICD-10-CM | POA: Diagnosis present

## 2016-05-11 DIAGNOSIS — Z79899 Other long term (current) drug therapy: Secondary | ICD-10-CM

## 2016-05-11 DIAGNOSIS — I959 Hypotension, unspecified: Secondary | ICD-10-CM | POA: Diagnosis present

## 2016-05-11 DIAGNOSIS — Z515 Encounter for palliative care: Secondary | ICD-10-CM

## 2016-05-11 DIAGNOSIS — Z955 Presence of coronary angioplasty implant and graft: Secondary | ICD-10-CM

## 2016-05-11 DIAGNOSIS — D631 Anemia in chronic kidney disease: Secondary | ICD-10-CM | POA: Diagnosis present

## 2016-05-11 DIAGNOSIS — M272 Inflammatory conditions of jaws: Secondary | ICD-10-CM | POA: Diagnosis present

## 2016-05-11 DIAGNOSIS — R6520 Severe sepsis without septic shock: Secondary | ICD-10-CM | POA: Diagnosis present

## 2016-05-11 DIAGNOSIS — R34 Anuria and oliguria: Secondary | ICD-10-CM | POA: Diagnosis not present

## 2016-05-11 DIAGNOSIS — D72829 Elevated white blood cell count, unspecified: Secondary | ICD-10-CM

## 2016-05-11 DIAGNOSIS — E860 Dehydration: Secondary | ICD-10-CM | POA: Diagnosis present

## 2016-05-11 DIAGNOSIS — R627 Adult failure to thrive: Secondary | ICD-10-CM | POA: Diagnosis present

## 2016-05-11 DIAGNOSIS — D638 Anemia in other chronic diseases classified elsewhere: Secondary | ICD-10-CM | POA: Diagnosis present

## 2016-05-11 DIAGNOSIS — E1159 Type 2 diabetes mellitus with other circulatory complications: Secondary | ICD-10-CM

## 2016-05-11 DIAGNOSIS — E119 Type 2 diabetes mellitus without complications: Secondary | ICD-10-CM | POA: Diagnosis present

## 2016-05-11 DIAGNOSIS — Z7189 Other specified counseling: Secondary | ICD-10-CM

## 2016-05-11 DIAGNOSIS — R000 Tachycardia, unspecified: Secondary | ICD-10-CM | POA: Diagnosis present

## 2016-05-11 DIAGNOSIS — N19 Unspecified kidney failure: Secondary | ICD-10-CM

## 2016-05-11 DIAGNOSIS — N141 Nephropathy induced by other drugs, medicaments and biological substances: Secondary | ICD-10-CM | POA: Diagnosis present

## 2016-05-11 DIAGNOSIS — C9000 Multiple myeloma not having achieved remission: Secondary | ICD-10-CM | POA: Diagnosis present

## 2016-05-11 DIAGNOSIS — K529 Noninfective gastroenteritis and colitis, unspecified: Secondary | ICD-10-CM | POA: Diagnosis present

## 2016-05-11 MED ORDER — SODIUM CHLORIDE 0.9 % IV BOLUS (SEPSIS): 1000.0000 mL | Freq: Once | INTRAVENOUS | Status: DC

## 2016-05-11 MED ORDER — DOCUSATE SODIUM 100 MG PO CAPS: 100.0000 mg | ORAL_CAPSULE | Freq: Two times a day (BID) | ORAL | 0 refills | Status: DC

## 2016-05-11 NOTE — ED Triage Notes (Signed)
Patient arrived with EMS from home reports hypotension this evening 58/30 , alert and oriented at arrival , CBG= 116 , BP= 134/82 at arrival , pt. added diarrhea today after taking Miralax.

## 2016-05-11 NOTE — Discharge Instructions (Signed)

## 2016-05-11 NOTE — ED Notes (Signed)
Patient back from radiology.

## 2016-05-11 NOTE — ED Notes (Signed)
Patient transported to x-ray. ?

## 2016-05-11 NOTE — ED Notes (Signed)
Tolerating PO fluids, denies nausea

## 2016-05-12 ENCOUNTER — Encounter (HOSPITAL_COMMUNITY): Payer: Self-pay | Admitting: Internal Medicine

## 2016-05-12 ENCOUNTER — Emergency Department (HOSPITAL_COMMUNITY): Payer: Medicare HMO

## 2016-05-12 DIAGNOSIS — R531 Weakness: Secondary | ICD-10-CM | POA: Diagnosis not present

## 2016-05-12 DIAGNOSIS — E785 Hyperlipidemia, unspecified: Secondary | ICD-10-CM | POA: Diagnosis present

## 2016-05-12 DIAGNOSIS — E43 Unspecified severe protein-calorie malnutrition: Secondary | ICD-10-CM | POA: Diagnosis present

## 2016-05-12 DIAGNOSIS — Z7189 Other specified counseling: Secondary | ICD-10-CM | POA: Diagnosis not present

## 2016-05-12 DIAGNOSIS — N471 Phimosis: Secondary | ICD-10-CM | POA: Diagnosis not present

## 2016-05-12 DIAGNOSIS — R197 Diarrhea, unspecified: Secondary | ICD-10-CM | POA: Diagnosis not present

## 2016-05-12 DIAGNOSIS — R6520 Severe sepsis without septic shock: Secondary | ICD-10-CM | POA: Diagnosis present

## 2016-05-12 DIAGNOSIS — A419 Sepsis, unspecified organism: Secondary | ICD-10-CM | POA: Diagnosis not present

## 2016-05-12 DIAGNOSIS — N183 Chronic kidney disease, stage 3 (moderate): Secondary | ICD-10-CM | POA: Diagnosis present

## 2016-05-12 DIAGNOSIS — K529 Noninfective gastroenteritis and colitis, unspecified: Secondary | ICD-10-CM | POA: Diagnosis present

## 2016-05-12 DIAGNOSIS — Z515 Encounter for palliative care: Secondary | ICD-10-CM | POA: Diagnosis not present

## 2016-05-12 DIAGNOSIS — R34 Anuria and oliguria: Secondary | ICD-10-CM | POA: Diagnosis not present

## 2016-05-12 DIAGNOSIS — R6510 Systemic inflammatory response syndrome (SIRS) of non-infectious origin without acute organ dysfunction: Secondary | ICD-10-CM

## 2016-05-12 DIAGNOSIS — R5381 Other malaise: Secondary | ICD-10-CM | POA: Diagnosis not present

## 2016-05-12 DIAGNOSIS — N189 Chronic kidney disease, unspecified: Secondary | ICD-10-CM | POA: Diagnosis not present

## 2016-05-12 DIAGNOSIS — R000 Tachycardia, unspecified: Secondary | ICD-10-CM | POA: Diagnosis not present

## 2016-05-12 DIAGNOSIS — I129 Hypertensive chronic kidney disease with stage 1 through stage 4 chronic kidney disease, or unspecified chronic kidney disease: Secondary | ICD-10-CM | POA: Diagnosis present

## 2016-05-12 DIAGNOSIS — R739 Hyperglycemia, unspecified: Secondary | ICD-10-CM | POA: Diagnosis not present

## 2016-05-12 DIAGNOSIS — K219 Gastro-esophageal reflux disease without esophagitis: Secondary | ICD-10-CM | POA: Diagnosis present

## 2016-05-12 DIAGNOSIS — E1159 Type 2 diabetes mellitus with other circulatory complications: Secondary | ICD-10-CM

## 2016-05-12 DIAGNOSIS — D649 Anemia, unspecified: Secondary | ICD-10-CM | POA: Diagnosis not present

## 2016-05-12 DIAGNOSIS — A4189 Other specified sepsis: Secondary | ICD-10-CM | POA: Diagnosis not present

## 2016-05-12 DIAGNOSIS — A0472 Enterocolitis due to Clostridium difficile, not specified as recurrent: Secondary | ICD-10-CM | POA: Diagnosis present

## 2016-05-12 DIAGNOSIS — E1122 Type 2 diabetes mellitus with diabetic chronic kidney disease: Secondary | ICD-10-CM | POA: Diagnosis present

## 2016-05-12 DIAGNOSIS — E876 Hypokalemia: Secondary | ICD-10-CM | POA: Diagnosis not present

## 2016-05-12 DIAGNOSIS — E8770 Fluid overload, unspecified: Secondary | ICD-10-CM | POA: Diagnosis not present

## 2016-05-12 DIAGNOSIS — D631 Anemia in chronic kidney disease: Secondary | ICD-10-CM | POA: Diagnosis present

## 2016-05-12 DIAGNOSIS — I2510 Atherosclerotic heart disease of native coronary artery without angina pectoris: Secondary | ICD-10-CM

## 2016-05-12 DIAGNOSIS — I10 Essential (primary) hypertension: Secondary | ICD-10-CM | POA: Diagnosis not present

## 2016-05-12 DIAGNOSIS — N179 Acute kidney failure, unspecified: Secondary | ICD-10-CM | POA: Diagnosis present

## 2016-05-12 DIAGNOSIS — D72829 Elevated white blood cell count, unspecified: Secondary | ICD-10-CM | POA: Diagnosis not present

## 2016-05-12 DIAGNOSIS — R64 Cachexia: Secondary | ICD-10-CM | POA: Diagnosis present

## 2016-05-12 DIAGNOSIS — E860 Dehydration: Secondary | ICD-10-CM | POA: Diagnosis present

## 2016-05-12 DIAGNOSIS — D638 Anemia in other chronic diseases classified elsewhere: Secondary | ICD-10-CM | POA: Diagnosis present

## 2016-05-12 DIAGNOSIS — R1084 Generalized abdominal pain: Secondary | ICD-10-CM | POA: Diagnosis not present

## 2016-05-12 DIAGNOSIS — E1165 Type 2 diabetes mellitus with hyperglycemia: Secondary | ICD-10-CM | POA: Diagnosis present

## 2016-05-12 DIAGNOSIS — K5100 Ulcerative (chronic) pancolitis without complications: Secondary | ICD-10-CM | POA: Diagnosis not present

## 2016-05-12 DIAGNOSIS — E872 Acidosis: Secondary | ICD-10-CM | POA: Diagnosis present

## 2016-05-12 DIAGNOSIS — C9000 Multiple myeloma not having achieved remission: Secondary | ICD-10-CM | POA: Diagnosis present

## 2016-05-12 DIAGNOSIS — E871 Hypo-osmolality and hyponatremia: Secondary | ICD-10-CM | POA: Diagnosis present

## 2016-05-12 DIAGNOSIS — A414 Sepsis due to anaerobes: Secondary | ICD-10-CM | POA: Diagnosis present

## 2016-05-12 HISTORY — DX: Systemic inflammatory response syndrome (sirs) of non-infectious origin without acute organ dysfunction: R65.10

## 2016-05-12 HISTORY — DX: Noninfective gastroenteritis and colitis, unspecified: K52.9

## 2016-05-12 LAB — CBC WITH DIFFERENTIAL/PLATELET
BASOS ABS: 0 10*3/uL (ref 0.0–0.1)
BASOS PCT: 0 %
Basophils Absolute: 0 10*3/uL (ref 0.0–0.1)
Basophils Relative: 0 %
EOS ABS: 0 10*3/uL (ref 0.0–0.7)
EOS ABS: 0 10*3/uL (ref 0.0–0.7)
Eosinophils Relative: 0 %
Eosinophils Relative: 0 %
HCT: 31.8 % — ABNORMAL LOW (ref 39.0–52.0)
HCT: 30.2 % — ABNORMAL LOW (ref 39.0–52.0)
HEMOGLOBIN: 11 g/dL — AB (ref 13.0–17.0)
Hemoglobin: 10.3 g/dL — ABNORMAL LOW (ref 13.0–17.0)
LYMPHS PCT: 3 %
LYMPHS PCT: 4 %
Lymphs Abs: 0.4 10*3/uL — ABNORMAL LOW (ref 0.7–4.0)
Lymphs Abs: 0.5 10*3/uL — ABNORMAL LOW (ref 0.7–4.0)
MCH: 27.7 pg (ref 26.0–34.0)
MCH: 27.5 pg (ref 26.0–34.0)
MCHC: 34.1 g/dL (ref 30.0–36.0)
MCHC: 34.6 g/dL (ref 30.0–36.0)
MCV: 80.1 fL (ref 78.0–100.0)
MCV: 80.5 fL (ref 78.0–100.0)
Monocytes Absolute: 1 10*3/uL (ref 0.1–1.0)
Monocytes Absolute: 1.2 10*3/uL — ABNORMAL HIGH (ref 0.1–1.0)
Monocytes Relative: 8 %
Monocytes Relative: 9 %
NEUTROS ABS: 10.9 10*3/uL — AB (ref 1.7–7.7)
NEUTROS PCT: 87 %
Neutro Abs: 11.8 10*3/uL — ABNORMAL HIGH (ref 1.7–7.7)
Neutrophils Relative %: 89 %
PLATELETS: 102 10*3/uL — AB (ref 150–400)
PLATELETS: 117 10*3/uL — AB (ref 150–400)
RBC: 3.97 MIL/uL — ABNORMAL LOW (ref 4.22–5.81)
RBC: 3.75 MIL/uL — ABNORMAL LOW (ref 4.22–5.81)
RDW: 16.7 % — ABNORMAL HIGH (ref 11.5–15.5)
RDW: 17.1 % — AB (ref 11.5–15.5)
WBC MORPHOLOGY: INCREASED
WBC: 12.3 10*3/uL — ABNORMAL HIGH (ref 4.0–10.5)
WBC: 13.5 10*3/uL — ABNORMAL HIGH (ref 4.0–10.5)

## 2016-05-12 LAB — COMPREHENSIVE METABOLIC PANEL
ALBUMIN: 2.1 g/dL — AB (ref 3.5–5.0)
ALBUMIN: 2.4 g/dL — AB (ref 3.5–5.0)
ALK PHOS: 48 U/L (ref 38–126)
ALT: 25 U/L (ref 17–63)
ALT: 31 U/L (ref 17–63)
ANION GAP: 13 (ref 5–15)
AST: 15 U/L (ref 15–41)
AST: 14 U/L — ABNORMAL LOW (ref 15–41)
Alkaline Phosphatase: 45 U/L (ref 38–126)
Anion gap: 10 (ref 5–15)
BUN: 25 mg/dL — AB (ref 6–20)
BUN: 27 mg/dL — ABNORMAL HIGH (ref 6–20)
CALCIUM: 8.4 mg/dL — AB (ref 8.9–10.3)
CHLORIDE: 101 mmol/L (ref 101–111)
CHLORIDE: 104 mmol/L (ref 101–111)
CO2: 17 mmol/L — AB (ref 22–32)
CO2: 23 mmol/L (ref 22–32)
CREATININE: 1.89 mg/dL — AB (ref 0.61–1.24)
Calcium: 7.7 mg/dL — ABNORMAL LOW (ref 8.9–10.3)
Creatinine, Ser: 1.88 mg/dL — ABNORMAL HIGH (ref 0.61–1.24)
GFR calc non Af Amer: 34 mL/min — ABNORMAL LOW (ref >60)
GFR calc non Af Amer: 34 mL/min — ABNORMAL LOW (ref >60)
GFR, EST AFRICAN AMERICAN: 39 mL/min — AB (ref >60)
GFR, EST AFRICAN AMERICAN: 39 mL/min — AB (ref >60)
GLUCOSE: 148 mg/dL — AB (ref 65–99)
Glucose, Bld: 202 mg/dL — ABNORMAL HIGH (ref 65–99)
Potassium: 3.7 mmol/L (ref 3.5–5.1)
Potassium: 3.3 mmol/L — ABNORMAL LOW (ref 3.5–5.1)
SODIUM: 134 mmol/L — AB (ref 135–145)
SODIUM: 134 mmol/L — AB (ref 135–145)
Total Bilirubin: 0.8 mg/dL (ref 0.3–1.2)
Total Bilirubin: 1 mg/dL (ref 0.3–1.2)
Total Protein: 5 g/dL — ABNORMAL LOW (ref 6.5–8.1)
Total Protein: 4.4 g/dL — ABNORMAL LOW (ref 6.5–8.1)

## 2016-05-12 LAB — URINALYSIS, ROUTINE W REFLEX MICROSCOPIC
BACTERIA UA: NONE SEEN
Bilirubin Urine: NEGATIVE
GLUCOSE, UA: NEGATIVE mg/dL
HGB URINE DIPSTICK: NEGATIVE
KETONES UR: 5 mg/dL — AB
LEUKOCYTES UA: NEGATIVE
NITRITE: NEGATIVE
PH: 5 (ref 5.0–8.0)
PROTEIN: 30 mg/dL — AB
Specific Gravity, Urine: 1.031 — ABNORMAL HIGH (ref 1.005–1.030)

## 2016-05-12 LAB — I-STAT CG4 LACTIC ACID, ED
Lactic Acid, Venous: 1.3 mmol/L (ref 0.5–1.9)
Lactic Acid, Venous: 1.53 mmol/L (ref 0.5–1.9)

## 2016-05-12 LAB — GLUCOSE, CAPILLARY
GLUCOSE-CAPILLARY: 297 mg/dL — AB (ref 65–99)
GLUCOSE-CAPILLARY: 452 mg/dL — AB (ref 65–99)
GLUCOSE-CAPILLARY: 397 mg/dL — AB (ref 65–99)
GLUCOSE-CAPILLARY: 463 mg/dL — AB (ref 65–99)
Glucose-Capillary: 220 mg/dL — ABNORMAL HIGH (ref 65–99)

## 2016-05-12 LAB — C DIFFICILE QUICK SCREEN W PCR REFLEX
C DIFFICLE (CDIFF) ANTIGEN: POSITIVE — AB
C Diff interpretation: DETECTED
C Diff toxin: POSITIVE — AB

## 2016-05-12 LAB — MAGNESIUM: MAGNESIUM: 1.6 mg/dL — AB (ref 1.7–2.4)

## 2016-05-12 LAB — TROPONIN I
TROPONIN I: 0.07 ng/mL — AB (ref <0.03)
Troponin I: 0.1 ng/mL (ref <0.03)
Troponin I: 0.06 ng/mL (ref <0.03)

## 2016-05-12 LAB — I-STAT TROPONIN, ED: Troponin i, poc: 0.04 ng/mL (ref 0.00–0.08)

## 2016-05-12 LAB — LACTIC ACID, PLASMA: LACTIC ACID, VENOUS: 1.5 mmol/L (ref 0.5–1.9)

## 2016-05-12 LAB — LIPASE, BLOOD: Lipase: 16 U/L (ref 11–51)

## 2016-05-12 MED ORDER — MAGNESIUM SULFATE 4 GM/100ML IV SOLN
4.0000 g | Freq: Once | INTRAVENOUS | Status: AC
  Administered 2016-05-12: 4 g via INTRAVENOUS
  Filled 2016-05-12: qty 100

## 2016-05-12 MED ORDER — POTASSIUM CHLORIDE 10 MEQ/100ML IV SOLN
10.0000 meq | INTRAVENOUS | Status: AC
  Administered 2016-05-12 (×4): 10 meq via INTRAVENOUS
  Filled 2016-05-12 (×4): qty 100

## 2016-05-12 MED ORDER — ENOXAPARIN SODIUM 40 MG/0.4ML ~~LOC~~ SOLN
40.0000 mg | SUBCUTANEOUS | Status: DC
  Administered 2016-05-12 – 2016-05-13 (×2): 40 mg via SUBCUTANEOUS
  Filled 2016-05-12 (×2): qty 0.4

## 2016-05-12 MED ORDER — IOPAMIDOL (ISOVUE-300) INJECTION 61%
INTRAVENOUS | Status: AC
  Administered 2016-05-12: 100 mL
  Filled 2016-05-12: qty 100

## 2016-05-12 MED ORDER — HYDROCORTISONE NA SUCCINATE PF 100 MG IJ SOLR
50.0000 mg | Freq: Once | INTRAMUSCULAR | Status: AC
  Administered 2016-05-12: 50 mg via INTRAVENOUS
  Filled 2016-05-12: qty 2

## 2016-05-12 MED ORDER — SODIUM CHLORIDE 0.9 % IV BOLUS (SEPSIS)
1000.0000 mL | Freq: Once | INTRAVENOUS | Status: AC
  Administered 2016-05-12: 1000 mL via INTRAVENOUS

## 2016-05-12 MED ORDER — SODIUM CHLORIDE 0.9 % IV SOLN
INTRAVENOUS | Status: DC
  Administered 2016-05-12: 07:00:00 via INTRAVENOUS

## 2016-05-12 MED ORDER — ONDANSETRON HCL 4 MG/2ML IJ SOLN
4.0000 mg | Freq: Four times a day (QID) | INTRAMUSCULAR | Status: DC | PRN
  Administered 2016-05-12 – 2016-05-14 (×3): 4 mg via INTRAVENOUS
  Filled 2016-05-12 (×3): qty 2

## 2016-05-12 MED ORDER — INSULIN ASPART 100 UNIT/ML ~~LOC~~ SOLN
0.0000 [IU] | Freq: Three times a day (TID) | SUBCUTANEOUS | Status: DC
  Administered 2016-05-12: 3 [IU] via SUBCUTANEOUS
  Administered 2016-05-12: 5 [IU] via SUBCUTANEOUS

## 2016-05-12 MED ORDER — NITROGLYCERIN 0.4 MG SL SUBL: 0.4000 mg | SUBLINGUAL_TABLET | SUBLINGUAL | Status: DC | PRN

## 2016-05-12 MED ORDER — INSULIN ASPART 100 UNIT/ML ~~LOC~~ SOLN: 10.0000 [IU] | Freq: Once | SUBCUTANEOUS | Status: DC

## 2016-05-12 MED ORDER — CIPROFLOXACIN HCL 500 MG PO TABS
500.0000 mg | ORAL_TABLET | Freq: Once | ORAL | Status: AC
  Administered 2016-05-12: 500 mg via ORAL
  Filled 2016-05-12: qty 1

## 2016-05-12 MED ORDER — INSULIN DETEMIR 100 UNIT/ML ~~LOC~~ SOLN
5.0000 [IU] | Freq: Every day | SUBCUTANEOUS | Status: DC
  Administered 2016-05-12: 5 [IU] via SUBCUTANEOUS
  Filled 2016-05-12 (×2): qty 0.05

## 2016-05-12 MED ORDER — ACYCLOVIR 200 MG PO CAPS
200.0000 mg | ORAL_CAPSULE | Freq: Two times a day (BID) | ORAL | Status: DC
  Administered 2016-05-12 – 2016-05-19 (×14): 200 mg via ORAL
  Filled 2016-05-12 (×15): qty 1

## 2016-05-12 MED ORDER — METRONIDAZOLE 500 MG PO TABS
500.0000 mg | ORAL_TABLET | Freq: Once | ORAL | Status: AC
  Administered 2016-05-12: 500 mg via ORAL
  Filled 2016-05-12: qty 1

## 2016-05-12 MED ORDER — VANCOMYCIN 50 MG/ML ORAL SOLUTION
500.0000 mg | Freq: Four times a day (QID) | ORAL | Status: AC
  Administered 2016-05-12 – 2016-05-26 (×50): 500 mg via ORAL
  Filled 2016-05-12 (×59): qty 10

## 2016-05-12 MED ORDER — INSULIN ASPART 100 UNIT/ML ~~LOC~~ SOLN
0.0000 [IU] | SUBCUTANEOUS | Status: DC
  Administered 2016-05-13: 8 [IU] via SUBCUTANEOUS
  Administered 2016-05-13: 15 [IU] via SUBCUTANEOUS
  Administered 2016-05-13: 11 [IU] via SUBCUTANEOUS
  Administered 2016-05-14: 3 [IU] via SUBCUTANEOUS
  Administered 2016-05-14: 2 [IU] via SUBCUTANEOUS
  Administered 2016-05-14: 5 [IU] via SUBCUTANEOUS

## 2016-05-12 MED ORDER — ACETAMINOPHEN 325 MG PO TABS: 650.0000 mg | ORAL_TABLET | Freq: Four times a day (QID) | ORAL | Status: DC | PRN

## 2016-05-12 MED ORDER — ACETAMINOPHEN 650 MG RE SUPP: 650.0000 mg | Freq: Four times a day (QID) | RECTAL | Status: DC | PRN

## 2016-05-12 MED ORDER — INSULIN ASPART 100 UNIT/ML ~~LOC~~ SOLN: 0.0000 [IU] | SUBCUTANEOUS | Status: DC

## 2016-05-12 MED ORDER — KCL-LACTATED RINGERS-D5W 20 MEQ/L IV SOLN
INTRAVENOUS | Status: DC
  Administered 2016-05-12 (×2): via INTRAVENOUS
  Filled 2016-05-12 (×3): qty 1000

## 2016-05-12 MED ORDER — METRONIDAZOLE IN NACL 5-0.79 MG/ML-% IV SOLN
500.0000 mg | Freq: Three times a day (TID) | INTRAVENOUS | Status: DC
  Administered 2016-05-12 – 2016-05-22 (×30): 500 mg via INTRAVENOUS
  Filled 2016-05-12 (×30): qty 100

## 2016-05-12 MED ORDER — ASPIRIN EC 81 MG PO TBEC
81.0000 mg | DELAYED_RELEASE_TABLET | Freq: Every day | ORAL | Status: DC
  Administered 2016-05-12 – 2016-05-19 (×7): 81 mg via ORAL
  Filled 2016-05-12 (×8): qty 1

## 2016-05-12 MED ORDER — INSULIN ASPART 100 UNIT/ML ~~LOC~~ SOLN
15.0000 [IU] | Freq: Once | SUBCUTANEOUS | Status: AC
  Administered 2016-05-12: 15 [IU] via SUBCUTANEOUS

## 2016-05-12 MED ORDER — ATORVASTATIN CALCIUM 20 MG PO TABS
20.0000 mg | ORAL_TABLET | Freq: Every day | ORAL | Status: DC
  Administered 2016-05-12 – 2016-05-31 (×19): 20 mg via ORAL
  Filled 2016-05-12 (×20): qty 1

## 2016-05-12 MED ORDER — ONDANSETRON HCL 4 MG PO TABS
4.0000 mg | ORAL_TABLET | Freq: Four times a day (QID) | ORAL | Status: DC | PRN
  Administered 2016-05-23: 4 mg via ORAL
  Filled 2016-05-12: qty 1

## 2016-05-12 NOTE — ED Notes (Signed)
Pt. had another BM on the way to CT scan , returned to room for incontinent care .

## 2016-05-12 NOTE — ED Notes (Signed)
Dr. Hal Hope notified on pt.'s hypotension , he ordered 1 liter bolus.

## 2016-05-12 NOTE — ED Notes (Signed)
CT scan tech notified that pt. Is ready for transport.

## 2016-05-12 NOTE — Progress Notes (Signed)
NURSING PROGRESS NOTE  Douglas Rose [id number] Admission Data: 05/12/2016 5:27 AM Attending Provider: Rise Patience, MD [id number], Douglas Rodney, MD Code Status: Full  Allergies:  Patient has no known allergies. Past Medical History:   has a past medical history of CAD (coronary artery disease) (2006); Cancer ([place]); Diabetes mellitus without complication ([place]); H/O non-insulin dependent diabetes mellitus; Hyperlipidemia; and Hypertension. Past Surgical History:   has a past surgical history that includes Cardiac catheterization (2006). Social History:   reports that he has never smoked. He has never used smokeless tobacco. He reports that he does not drink alcohol or use drugs.  Douglas Rose. is a 71 y.o. male patient admitted from ED:   Last Documented Vital Signs: Blood pressure 122/69, pulse (!) 116, temperature 97.8 F (36.6 C), temperature source Oral, resp. rate 18, height 5\' 11"  (1.803 m), weight 73.9 kg (163 lb), SpO2 99 %.  Cardiac Monitoring: Box # 37 in place. Cardiac monitor yields:sinus tachycardia.  IV Fluids:  IV in place, occlusive dsg intact without redness, IV cath antecubital left, condition patent and no redness normal saline. IV cath right hand, condition patent and no redness  Skin: Appropriate for ethnicity and intact.  Patient/Family orientated to room. Information packet given to patient/family. Admission inpatient armband information verified with patient/family to include name and date of birth and placed on patient arm. Side rails up x 2, fall assessment and education completed with patient/family. Patient/family able to verbalize understanding of risk associated with falls and verbalized understanding to call for assistance before getting out of bed. Call light within reach. Patient/family able to voice and demonstrate understanding of unit orientation instructions.    Will continue to evaluate and treat per MD orders.  Douglas Hakim RN,  BSN

## 2016-05-12 NOTE — ED Provider Notes (Signed)
[facility] DEPT Provider Note   CSN: [id number] Arrival date & time: 05/11/16  2348  By signing my name below, I, Douglas Rose, attest that this documentation has been prepared under the direction and in the presence of Mackuen, [name], MD. Electronically Signed: [name], ED Scribe. 05/12/16. 12:40 AM.    History   Chief Complaint Chief Complaint  Patient presents with  . Hypotension    HPI Douglas Rose. is a 71 y.o. male with h/o CAD, stent placement, multiple myeloma, DM, HLD, HTN who presents to the Emergency Department complaining of moderate, generalized abdominal pain that began 3 days ago. Pt was seen in the ED on 05/11/16 for his pain, was dx with constipation via XR and was started on an aggressive bowel regimen. Per wife, the pt received Miralax one  time yesterday and it began to work today, causing diarrhea. Pt complains he became generally weak and began to vomit yesterday evening. No h/o CHF. He denies fever.    The history is provided by the patient. No language interpreter was used.    Past Medical History:  Diagnosis Date  . CAD (coronary artery disease) 2006   stent LAD 3.0x18 [place], occluded RCA  . Cancer ([place])    multiple myeloma  . Diabetes mellitus without complication ([place])   . H/O non-insulin dependent diabetes mellitus   . Hyperlipidemia   . Hypertension     Patient Active Problem List   Diagnosis Date Noted  . Colitis 05/12/2016  . SIRS (systemic inflammatory response syndrome) ([place]) 05/12/2016  . 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 Multiple myeloma ([place]) 03/20/2016  . Acute renal failure ([place]) 03/06/2016  . Monoclonal gammopathy   . Protein-calorie malnutrition, severe 03/03/2016  . Pancreatitis 03/02/2016  . Enlarged prostate 03/02/2016  . Upper abdominal pain   . Urinary retention   . Acute renal failure (ARF) ([place]) 03/01/2016  . Abdominal fullness 02/26/2016  . Lymphadenopathy 11/21/2015  .  Lipoma 07/19/2014  . Coronary artery disease   . Hyperlipidemia   . Hypertension   . H/O non-insulin dependent diabetes mellitus     Past Surgical History:  Procedure Laterality Date  . CARDIAC CATHETERIZATION  2006       Home Medications    Prior to Admission medications   Medication Sig Start Date End Date Taking? Authorizing Provider  acyclovir (ZOVIRAX) 200 MG capsule Take 1 capsule (200 mg total) by mouth 2 (two) times daily. 04/08/16  Yes Brunetta Genera, MD  amLODipine (NORVASC) 10 MG tablet Take 10 mg by mouth daily.   Yes [provider]  aspirin 81 MG tablet Take 81 mg by mouth daily.   Yes [provider]  atorvastatin (LIPITOR) 20 MG tablet Take 1 tablet (20 mg total) by mouth daily. 12/06/12  Yes Burtis Junes, NP  chlorhexidine (PERIDEX) 0.12 % solution Use as directed 15 mLs in the mouth or throat 2 (two) times daily. 04/15/16  Yes Brunetta Genera, MD  cyclophosphamide (CYTOXAN) 50 MG tablet Take 12 tablets (600 mg total) by mouth once a week. Take days 1,8,15 of chemo 1hr before or 2hr after meals every 21d. Capsules. ICD:C90.00 04/03/16  Yes Brunetta Genera, MD  dexamethasone (DECADRON) 4 MG tablet Take 5 tablets (20 mg) on days 1, 8, and 15 of chemo. Repeat every 21 days. Take 2 tablets (39m) daily for 2 days after days1 and 8 of chemo 03/31/16  Yes KBrunetta Genera MD  docusate sodium (COLACE) 100 MG capsule Take 1 capsule (100 mg total) by mouth every 12 (twelve) hours. 05/11/16  Yes [name], MD  insulin aspart (NOVOLOG) 100 UNIT/ML FlexPen Three times a day before meals per sliding scale. between 2-6 on average 04/09/16  Yes [provider]  Insulin Glargine (LANTUS SOLOSTAR) 100 UNIT/ML Solostar Pen Inject 5 Units into the skin daily.  03/25/16  Yes [provider]  metoprolol succinate (TOPROL-XL) 25 MG 24 hr tablet Take 3 tablets (75 mg total) by mouth daily. Take with or immediately following a meal.  03/05/16  Yes Velna Ochs, MD  nitroGLYCERIN (NITROSTAT) 0.4 MG SL tablet Place 1 tablet (0.4 mg total) under the tongue every 5 (five) minutes x 3 doses as needed for chest pain. 11/13/12  Yes Edmisten, Brooke O, PA-C  ondansetron (ZOFRAN) 8 MG tablet Take 1 tablet (8 mg total) by mouth 2 (two) times daily as needed for refractory nausea / vomiting. 03/31/16  Yes Brunetta Genera, MD  prochlorperazine (COMPAZINE) 10 MG tablet Take 1 tablet (10 mg total) by mouth every 6 (six) hours as needed (Nausea or vomiting). 03/31/16  Yes Brunetta Genera, MD  tamsulosin (FLOMAX) 0.4 MG CAPS capsule Take 1 capsule (0.4 mg total) by mouth daily after supper. 03/04/16  Yes Velna Ochs, MD    Family History Family History  Problem Relation Age of Onset  . Arrhythmia Mother   . Diabetes Sister   . Diabetes Brother   . Diabetes Brother     Social History Social History  Substance Use Topics  . Smoking status: Never Smoker  . Smokeless tobacco: Never Used  . Alcohol use No     Allergies   Patient has no known allergies.   Review of Systems Review of Systems  Constitutional: Negative for fever.  Gastrointestinal: Positive for abdominal pain, diarrhea and vomiting.  Neurological: Positive for weakness (generalized).  All other systems reviewed and are negative.    Physical Exam Updated Vital Signs BP 122/69 (BP Location: Right Arm)   Pulse (!) 116   Temp 97.8 F (36.6 C) (Oral)   Resp 18   Ht 5' 11"  (1.803 m)   Wt 163 lb (73.9 kg)   SpO2 99%   BMI 22.73 kg/m   Physical Exam  Constitutional: He appears well-developed and well-nourished.  HENT:  Head: Normocephalic.  Eyes: Conjunctivae are normal.  Cardiovascular: Normal rate, regular rhythm and normal heart sounds.   Pulmonary/Chest: Effort normal and breath sounds normal. No respiratory distress. He has no wheezes. He has no rales.  Abdominal: He exhibits no distension. There is tenderness.  Diffuse tenderness,  worse in LLQ.   Musculoskeletal: Normal range of motion. He exhibits no edema.  No peripheral edema.   Neurological: He is alert.  Skin: Skin is warm and dry.  Psychiatric: He has a normal mood and affect. His behavior is normal.  Nursing note and vitals reviewed.    ED Treatments / Results   DIAGNOSTIC STUDIES: Oxygen Saturation is 100% on RA, normal by my interpretation.    COORDINATION OF CARE: 12:37 AM Discussed treatment plan with pt at bedside which includes labs, CT, CXR and pt agreed to plan.    Labs (all labs ordered are listed, but only abnormal results are displayed) Labs Reviewed  CBC WITH DIFFERENTIAL/PLATELET - Abnormal; Notable for the following:       Result Value   WBC 12.3 (*)    RBC 3.97 (*)    Hemoglobin 11.0 (*)  HCT 31.8 (*)    RDW 16.7 (*)    Platelets 117 (*)    Neutro Abs 10.9 (*)    Lymphs Abs 0.4 (*)    All other components within normal limits  COMPREHENSIVE METABOLIC PANEL - Abnormal; Notable for the following:    Sodium 134 (*)    Glucose, Bld 148 (*)    BUN 25 (*)    Creatinine, Ser 1.89 (*)    Calcium 8.4 (*)    Total Protein 5.0 (*)    Albumin 2.4 (*)    GFR calc non Af Amer 34 (*)    GFR calc Af Amer 39 (*)    All other components within normal limits  URINALYSIS, ROUTINE W REFLEX MICROSCOPIC - Abnormal; Notable for the following:    Specific Gravity, Urine 1.031 (*)    Ketones, ur 5 (*)    Protein, ur 30 (*)    Squamous Epithelial / LPF 0-5 (*)    All other components within normal limits  CBC WITH DIFFERENTIAL/PLATELET - Abnormal; Notable for the following:    WBC 13.5 (*)    RBC 3.75 (*)    Hemoglobin 10.3 (*)    HCT 30.2 (*)    RDW 17.1 (*)    Platelets 102 (*)    All other components within normal limits  C DIFFICILE QUICK SCREEN W PCR REFLEX  GASTROINTESTINAL PANEL BY PCR, STOOL (REPLACES STOOL CULTURE)  LIPASE, BLOOD  LACTIC ACID, PLASMA  TROPONIN I  TROPONIN I  TROPONIN I  COMPREHENSIVE METABOLIC PANEL    CBC  I-STAT TROPOININ, ED  I-STAT CG4 LACTIC ACID, ED  I-STAT CG4 LACTIC ACID, ED    EKG  EKG Interpretation None       Radiology Dg Chest 2 View  Result Date: 05/12/2016 CLINICAL DATA:  Diffuse abdominal pain and fever tonight, chest pain. EXAM: CHEST  2 VIEW COMPARISON:  Chest radiograph May 11, 2016 FINDINGS: Cardiomediastinal silhouette is normal. No pleural effusions or focal consolidations. Trachea projects midline and there is no pneumothorax. Soft tissue planes and included osseous structures are non-suspicious. Multiple EKG lines overlie the patient and may obscure subtle underlying pathology. IMPRESSION: Stable examination:  No acute cardiopulmonary process. Electronically Signed   By: Elon Alas M.D.   On: 05/12/2016 02:57   Ct Abdomen Pelvis W Contrast  Result Date: 05/12/2016 CLINICAL DATA:  Constipation since yesterday, took MiraLax and now has uncontrollable diarrhea and vomiting. Diffuse abdominal pain. History of multiple myeloma, diabetes. EXAM: CT ABDOMEN AND PELVIS WITH CONTRAST TECHNIQUE: Multidetector CT imaging of the abdomen and pelvis was performed using the standard protocol following bolus administration of intravenous contrast. CONTRAST:  166m ISOVUE-300 IOPAMIDOL (ISOVUE-300) INJECTION 61% COMPARISON:  Abdominal radiograph May 11, 2016 and CT abdomen and pelvis April 21, 2016 FINDINGS: LOWER CHEST: Lung bases are clear. Included heart size is normal. No pericardial effusion. HEPATOBILIARY: Liver and gallbladder are normal. PANCREAS: Normal. SPLEEN: Normal. ADRENALS/URINARY TRACT: Kidneys are orthotopic, demonstrating symmetric enhancement. No nephrolithiasis, hydronephrosis or solid renal masses. Too small to characterize hypodensities in the kidneys bilaterally. The unopacified ureters are normal in course and caliber. Delayed imaging through the kidneys demonstrates symmetric prompt contrast excretion within the proximal urinary collecting system. Urinary  bladder is partially distended. Normal adrenal glands. STOMACH/BOWEL: Severe general colonic wall thickening and edema with pericolonic inflammation. Very small hiatal hernia. The stomach, small bowel are normal in course and caliber without inflammatory changes. Normal appendix. VASCULAR/LYMPHATIC: Aortoiliac vessels are normal in course and caliber,  mild to moderate calcific atherosclerosis. No lymphadenopathy by CT size criteria. REPRODUCTIVE: Prostatomegaly invading the base of the bladder. OTHER: Small to moderate low-density free fluid without focal fluid collection or intraperitoneal free air. MUSCULOSKELETAL: Nonacute. Bridging RIGHT sacroiliac osteophyte. Severe L5-S1 facet arthropathy. IMPRESSION: Severe colitis, recommend correlation with stool sampling (Clostridium difficile infection can have this appearance). Small to moderate ascites. Electronically Signed   By: Elon Alas M.D.   On: 05/12/2016 02:54   Dg Abd Acute W/chest  Result Date: 05/11/2016 CLINICAL DATA:  Abdominal pain and constipation EXAM: DG ABDOMEN ACUTE W/ 1V CHEST COMPARISON:  None. FINDINGS: There is a generous volume of air an small bowel, more likely nonobstructive. No extraluminal gas. Stool and air is present in the colon. No biliary or urinary calculi. Upright view of the chest is negative for acute cardiopulmonary abnormality. IMPRESSION: Generous volume of air throughout small bowel. There also is stool and air in the colon. No evidence of bowel obstruction or perforation. Electronically Signed   By: Andreas Newport M.D.   On: 05/11/2016 00:45    Procedures Procedures (including critical care time)  Medications Ordered in ED Medications  acyclovir (ZOVIRAX) 200 MG capsule 200 mg (not administered)  atorvastatin (LIPITOR) tablet 20 mg (not administered)  nitroGLYCERIN (NITROSTAT) SL tablet 0.4 mg (not administered)  aspirin EC tablet 81 mg (not administered)  metroNIDAZOLE (FLAGYL) IVPB 500 mg (500 mg  Intravenous New Bag/Given 05/12/16 0636)  acetaminophen (TYLENOL) tablet 650 mg (not administered)    Or  acetaminophen (TYLENOL) suppository 650 mg (not administered)  ondansetron (ZOFRAN) tablet 4 mg (not administered)    Or  ondansetron (ZOFRAN) injection 4 mg (not administered)  insulin aspart (novoLOG) injection 0-9 Units (not administered)  enoxaparin (LOVENOX) injection 40 mg (not administered)  0.9 %  sodium chloride infusion ( Intravenous New Bag/Given 05/12/16 0633)  sodium chloride 0.9 % bolus 1,000 mL (0 mLs Intravenous Stopped 05/12/16 0252)  iopamidol (ISOVUE-300) 61 % injection (100 mLs  Contrast Given 05/12/16 0228)  metroNIDAZOLE (FLAGYL) tablet 500 mg (500 mg Oral Given 05/12/16 0338)  ciprofloxacin (CIPRO) tablet 500 mg (500 mg Oral Given 05/12/16 0338)  sodium chloride 0.9 % bolus 1,000 mL (1,000 mLs Intravenous New Bag/Given 05/12/16 0436)  hydrocortisone sodium succinate (SOLU-CORTEF) 100 MG injection 50 mg (50 mg Intravenous Given 05/12/16 5053)     Initial Impression / Assessment and Plan / ED Course  I have reviewed the triage vital signs and the nursing notes.  Pertinent labs & imaging results that were available during my care of the patient were reviewed by me and considered in my medical decision making (see chart for details).     I personally performed the services described in this documentation, which was scribed in my presence. The recorded information has been reviewed and is accurate.   Patient is a 71 year old male on chemotherapy for multiple myeloma presenting today with abdominal pain. Patient has had abdominal pain since last week. He had a CAT scan showing some inflammation done 2 weeks ago. Patient is a things gotten worse the last several days. Patient came to the emergency Department yesterday had an x-ray and was treated for constipation with MiraLAX. Patient took one cap full. Today's pt started stooling and unable to stop. Since being in the emergency  department patient's had 6-7 loose bowel movements. Patient feels very dehydrated. CT reveals severe colitis. Patient tachycardic despite fluid resuscitation with 1 L. Creatinine went from 1.3 to1.8 since last month.  We will send  C. difficile. Given his hypotension at home, residual tachycardia after 1 L, will admit for observation to hospital.  Final Clinical Impressions(s) / ED Diagnoses   Final diagnoses:  None    New Prescriptions Current Discharge Medication List       Macarthur Critchley, MD 05/12/16 [phone]

## 2016-05-12 NOTE — Progress Notes (Signed)
CRITICAL VALUE ALERT  Critical value received:  Troponin 0.07  Date of notification:  05/12/2016  Time of notification:  0650  Critical value read back:Yes.    Nurse who received alert:  Clydell Hakim RN  MD notified (1st page):  NP Schorr  Time of first page:  [phone]

## 2016-05-12 NOTE — ED Notes (Signed)
Stool specimen delivered to lab. 

## 2016-05-12 NOTE — Progress Notes (Signed)
CRITICAL VALUE ALERT  Critical value received: C.dif positive  Date of notification: 05/12/16  Time of notification: 7:35  Critical value read back yes  Nurse who received alert: Lonn Georgia, RN  MD notified (1st page): Dr. Sheran Fava  Time of first page: 7:45  Responding MD: Dr. Sheran Fava  Time MD responded: 7:45

## 2016-05-12 NOTE — Progress Notes (Signed)
Attempted to receive report.  ED will call RN back.

## 2016-05-12 NOTE — Progress Notes (Signed)
Received report from ED RN, Mortimer Fries.

## 2016-05-12 NOTE — Progress Notes (Signed)
Inpatient Diabetes Program Recommendations  AACE/ADA: New Consensus Statement on Inpatient Glycemic Control (2015)  Target Ranges:  Prepandial:   less than 140 mg/dL      Peak postprandial:   less than 180 mg/dL (1-2 hours)      Critically ill patients:  140 - 180 mg/dL   Lab Results  Component Value Date   GLUCAP 297 (H) 05/12/2016   HGBA1C 9.7 (H) 11/13/2012    Review of Glycemic Control:  Results for Douglas Rose, Douglas Rose (MRN [id number]) as of 05/12/2016 13:46  Ref. Range 05/12/2016 08:35 05/12/2016 12:25  Glucose-Capillary Latest Ref Range: 65 - 99 mg/dL 220 (H) 297 (H)   Diabetes history: Type 2 diabetes Outpatient Diabetes medications: Novolog sensitive tid with meals Current orders for Inpatient glycemic control:  Novolog sensitive tid with meals  Inpatient Diabetes Program Recommendations:   May consider adding Lantus 10 units daily while in the hospital.    Thanks, Adah Perl, RN, BC-ADM Inpatient Diabetes Coordinator Pager [phone] (8a-5p)

## 2016-05-12 NOTE — Progress Notes (Signed)
Patient admitted after midnight.  Seen and examined.  He continues to have severe abdominal pain with frequent emesis and diarrhea.  He reports he had C. Diff colitis in February and completed his course of antibiotics.  His diarrhea resolved and he was doing well and tolerating his chemotherapy since then.  He does not typically get vomiting or diarrhea with his chemotherapy.  C. Diff PCR positive this morning.  He is increasingly tachycardic and currently unable to tolerate PO.  On exam, ill-appearing male, no acute distress.  Dry mouth, NCAT.  Tachycardic.  CTAB.  Hypoactive bowel sounds, soft, non-distended, TTP diffusely without rebound or guarding.  He vomited yellow-green and had a liquid stool during my exam.  Added oral vancomycin to IV flagyl.  1 L NS bolus.  Changed fluids to contain dextrose as CBGs are now in low 100s and not tolerating PO intake.  I will adjust insulin as needed to compensate for dextrose in fluids if needed.

## 2016-05-12 NOTE — ED Notes (Signed)
Pt. transported back to CT scan.

## 2016-05-12 NOTE — H&P (Signed)
History and Physical    [name]. ZOX:[id number] DOB: [birth year] DOA: 05/11/2016  PCP: Douglas Mires, MD  Patient coming from: Home.  Chief Complaint: Abdominal pain with diarrhea.  HPI: Douglas Rose. is a 71 y.o. male with history of multiple myeloma recently diagnosed and chemotherapy last chemotherapy 3 days ago and recently placed on clindamycin for the last 3 weeks for mandibular abscess presents to the ER with complaints of diffuse crampy abdominal pain with multiple episodes of diarrhea. Patient had one episode of nausea and vomiting. Denies any fever or chills. Over the last 4 weeks patient has been on chemotherapy for multiple myeloma. Patient states over the last 24 hours patient also had some exertional chest pressure. No chest pain on resting.   ED Course: In the ER patient is found to be tachycardic with leukocytosis. Patient blood pressure was in the low normal. Lactate was normal. CT scan shows diffuse colitis concerning for C. difficile. C. difficile panel and GI pathogen panel are pending. Patient was given 1 L fluid bolus and the second liter is in the process. On my exam patient is not having any chest pain presently. Abdomen is diffusely mildly tender. No rigidity. Patient is admitted for SIRS from diffuse colitis.  Review of Systems: As per HPI, rest all negative.   Past Medical History:  Diagnosis Date  . CAD (coronary artery disease) 2006   stent LAD 3.0x18 [place], occluded RCA  . Cancer (Douglas Rose)    multiple myeloma  . Diabetes mellitus without complication (Douglas Rose)   . H/O non-insulin dependent diabetes mellitus   . Hyperlipidemia   . Hypertension     Past Surgical History:  Procedure Laterality Date  . CARDIAC CATHETERIZATION  2006     reports that he has never smoked. He has never used smokeless tobacco. He reports that he does not drink alcohol or use drugs.  No Known Allergies  Family History  Problem Relation Age of Onset  . Arrhythmia  Mother   . Diabetes Sister   . Diabetes Brother   . Diabetes Brother     Prior to Admission medications   Medication Sig Start Date End Date Taking? Authorizing Provider  acyclovir (ZOVIRAX) 200 MG capsule Take 1 capsule (200 mg total) by mouth 2 (two) times daily. 04/08/16  Yes Brunetta Genera, MD  amLODipine (NORVASC) 10 MG tablet Take 10 mg by mouth daily.   Yes [provider]  aspirin 81 MG tablet Take 81 mg by mouth daily.   Yes [provider]  atorvastatin (LIPITOR) 20 MG tablet Take 1 tablet (20 mg total) by mouth daily. 12/06/12  Yes Burtis Junes, NP  chlorhexidine (PERIDEX) 0.12 % solution Use as directed 15 mLs in the mouth or throat 2 (two) times daily. 04/15/16  Yes Brunetta Genera, MD  cyclophosphamide (CYTOXAN) 50 MG tablet Take 12 tablets (600 mg total) by mouth once a week. Take days 1,8,15 of chemo 1hr before or 2hr after meals every 21d. Capsules. ICD:C90.00 04/03/16  Yes Brunetta Genera, MD  dexamethasone (DECADRON) 4 MG tablet Take 5 tablets (20 mg) on days 1, 8, and 15 of chemo. Repeat every 21 days. Take 2 tablets (12m) daily for 2 days after days1 and 8 of chemo 03/31/16  Yes KBrunetta Genera MD  docusate sodium (COLACE) 100 MG capsule Take 1 capsule (100 mg total) by mouth every 12 (twelve) hours. 05/11/16  Yes WRipley Fraise MD  insulin aspart (NOVOLOG) 100 UNIT/ML  FlexPen Three times a day before meals per sliding scale. between 2-6 on average 04/09/16  Yes [provider]  Insulin Glargine (LANTUS SOLOSTAR) 100 UNIT/ML Solostar Pen Inject 5 Units into the skin daily.  03/25/16  Yes [provider]  metoprolol succinate (TOPROL-XL) 25 MG 24 hr tablet Take 3 tablets (75 mg total) by mouth daily. Take with or immediately following a meal. 03/05/16  Yes Velna Ochs, MD  nitroGLYCERIN (NITROSTAT) 0.4 MG SL tablet Place 1 tablet (0.4 mg total) under the tongue every 5 (five) minutes x 3 doses as needed for chest pain.  11/13/12  Yes Edmisten, Brooke O, PA-C  ondansetron (ZOFRAN) 8 MG tablet Take 1 tablet (8 mg total) by mouth 2 (two) times daily as needed for refractory nausea / vomiting. 03/31/16  Yes Brunetta Genera, MD  prochlorperazine (COMPAZINE) 10 MG tablet Take 1 tablet (10 mg total) by mouth every 6 (six) hours as needed (Nausea or vomiting). 03/31/16  Yes Brunetta Genera, MD  tamsulosin (FLOMAX) 0.4 MG CAPS capsule Take 1 capsule (0.4 mg total) by mouth daily after supper. 03/04/16  Yes Velna Ochs, MD    Physical Exam: Vitals:   05/12/16 0400 05/12/16 0424 05/12/16 0430 05/12/16 0501  BP: (!) 108/56 (!) 91/59 101/62 122/69  Pulse:  (!) 112 (!) 112 (!) 116  Resp: _0 Temp:    97.8 F (36.6 C)  TempSrc:    Oral  SpO2:  100% 98% 99%  Weight:    73.9 kg (163 lb)  Height:    _1  (1.803 m)      Constitutional: Moderately built and nourished. Vitals:   05/12/16 0400 05/12/16 0424 05/12/16 0430 05/12/16 0501  BP: (!) 108/56 (!) 91/59 101/62 122/69  Pulse:  (!) 112 (!) 112 (!) 116  Resp: _2 Temp:    97.8 F (36.6 C)  TempSrc:    Oral  SpO2:  100% 98% 99%  Weight:    73.9 kg (163 lb)  Height:    _3  (1.803 m)   Eyes: Anicteric. No pallor. ENMT: No discharge from the ears eyes nose and mouth. Neck: No mass felt. No neck rigidity. No JVD appreciated. Respiratory: No rhonchi or crepitations. Cardiovascular: S1 and S2 heard no murmurs appreciated. Abdomen: Mild diffuse tenderness no guarding or rigidity. Musculoskeletal: No edema. No joint effusion. Skin: No rash. Skin appears warm. Neurologic: Alert awake oriented to time place and person. Moves all extremities. Psychiatric: Appears normal. Normal affect.   Labs on Admission: I have personally reviewed following labs and imaging studies  CBC:  Recent Labs Lab 05/06/16 1302 05/10/16 2258 05/12/16 0050  WBC 4.3 6.0 12.3*  NEUTROABS 3.9  --  10.9*  HGB 9.8* 10.6* 11.0*  HCT 29.2* 31.2* 31.8*   MCV 82.7 80.8 80.1  PLT 171 126* 482*   Basic Metabolic Panel:  Recent Labs Lab 05/06/16 1302 05/10/16 2258 05/12/16 0050  NA 138 134* 134*  K 4.0 3.3* 3.7  CL  --  102 101  CO2 27 20* 23  GLUCOSE 289* 310* 148*  BUN 19.2 18 25*  CREATININE 1.5* 1.67* 1.89*  CALCIUM 9.1 8.4* 8.4*   GFR: Estimated Creatinine Clearance: 36.9 mL/min (A) (by C-G formula based on SCr of 1.89 mg/dL (H)). Liver Function Tests:  Recent Labs Lab 05/06/16 1302 05/10/16 2258 05/12/16 0050  AST _4 ALT 118* 42 31  ALKPHOS 75 48 48  BILITOT 0.39 0.8  0.8  PROT 6.9 5.8* 5.0*  ALBUMIN 3.4* 2.8* 2.4*    Recent Labs Lab 05/12/16 0050  LIPASE 16   No results for input(s): AMMONIA in the last 168 hours. Coagulation Profile: No results for input(s): INR, PROTIME in the last 168 hours. Cardiac Enzymes: No results for input(s): CKTOTAL, CKMB, CKMBINDEX, TROPONINI in the last 168 hours. BNP (last 3 results) No results for input(s): PROBNP in the last 8760 hours. HbA1C: No results for input(s): HGBA1C in the last 72 hours. CBG: No results for input(s): GLUCAP in the last 168 hours. Lipid Profile: No results for input(s): CHOL, HDL, LDLCALC, TRIG, CHOLHDL, LDLDIRECT in the last 72 hours. Thyroid Function Tests: No results for input(s): TSH, T4TOTAL, FREET4, T3FREE, THYROIDAB in the last 72 hours. Anemia Panel: No results for input(s): VITAMINB12, FOLATE, FERRITIN, TIBC, IRON, RETICCTPCT in the last 72 hours. Urine analysis:    Component Value Date/Time   COLORURINE YELLOW 05/12/2016 0400   APPEARANCEUR CLEAR 05/12/2016 0400   LABSPEC 1.031 (H) 05/12/2016 0400   PHURINE 5.0 05/12/2016 0400   GLUCOSEU NEGATIVE 05/12/2016 0400   HGBUR NEGATIVE 05/12/2016 0400   BILIRUBINUR NEGATIVE 05/12/2016 0400   KETONESUR 5 (A) 05/12/2016 0400   PROTEINUR 30 (A) 05/12/2016 0400   NITRITE NEGATIVE 05/12/2016 0400   LEUKOCYTESUR NEGATIVE 05/12/2016 0400   Sepsis  Labs: _0 (procalcitonin:4,lacticidven:4) )No results found for this or any previous visit (from the past 240 hour(s)).   Radiological Exams on Admission: Dg Chest 2 View  Result Date: 05/12/2016 CLINICAL DATA:  Diffuse abdominal pain and fever tonight, chest pain. EXAM: CHEST  2 VIEW COMPARISON:  Chest radiograph May 11, 2016 FINDINGS: Cardiomediastinal silhouette is normal. No pleural effusions or focal consolidations. Trachea projects midline and there is no pneumothorax. Soft tissue planes and included osseous structures are non-suspicious. Multiple EKG lines overlie the patient and may obscure subtle underlying pathology. IMPRESSION: Stable examination:  No acute cardiopulmonary process. Electronically Signed   By: Elon Alas M.D.   On: 05/12/2016 02:57   Ct Abdomen Pelvis W Contrast  Result Date: 05/12/2016 CLINICAL DATA:  Constipation since yesterday, took MiraLax and now has uncontrollable diarrhea and vomiting. Diffuse abdominal pain. History of multiple myeloma, diabetes. EXAM: CT ABDOMEN AND PELVIS WITH CONTRAST TECHNIQUE: Multidetector CT imaging of the abdomen and pelvis was performed using the standard protocol following bolus administration of intravenous contrast. CONTRAST:  165m ISOVUE-300 IOPAMIDOL (ISOVUE-300) INJECTION 61% COMPARISON:  Abdominal radiograph May 11, 2016 and CT abdomen and pelvis April 21, 2016 FINDINGS: LOWER CHEST: Lung bases are clear. Included heart size is normal. No pericardial effusion. HEPATOBILIARY: Liver and gallbladder are normal. PANCREAS: Normal. SPLEEN: Normal. ADRENALS/URINARY TRACT: Kidneys are orthotopic, demonstrating symmetric enhancement. No nephrolithiasis, hydronephrosis or solid renal masses. Too small to characterize hypodensities in the kidneys bilaterally. The unopacified ureters are normal in course and caliber. Delayed imaging through the kidneys demonstrates symmetric prompt contrast excretion within the proximal urinary collecting  system. Urinary bladder is partially distended. Normal adrenal glands. STOMACH/BOWEL: Severe general colonic wall thickening and edema with pericolonic inflammation. Very small hiatal hernia. The stomach, small bowel are normal in course and caliber without inflammatory changes. Normal appendix. VASCULAR/LYMPHATIC: Aortoiliac vessels are normal in course and caliber, mild to moderate calcific atherosclerosis. No lymphadenopathy by CT size criteria. REPRODUCTIVE: Prostatomegaly invading the base of the bladder. OTHER: Small to moderate low-density free fluid without focal fluid collection or intraperitoneal free air. MUSCULOSKELETAL: Nonacute. Bridging RIGHT sacroiliac osteophyte. Severe L5-S1 facet arthropathy. IMPRESSION: Severe colitis, recommend  correlation with stool sampling (Clostridium difficile infection can have this appearance). Small to moderate ascites. Electronically Signed   By: Elon Alas M.D.   On: 05/12/2016 02:54   Dg Abd Acute W/chest  Result Date: 05/11/2016 CLINICAL DATA:  Abdominal pain and constipation EXAM: DG ABDOMEN ACUTE W/ 1V CHEST COMPARISON:  None. FINDINGS: There is a generous volume of air an small bowel, more likely nonobstructive. No extraluminal gas. Stool and air is present in the colon. No biliary or urinary calculi. Upright view of the chest is negative for acute cardiopulmonary abnormality. IMPRESSION: Generous volume of air throughout small bowel. There also is stool and air in the colon. No evidence of bowel obstruction or perforation. Electronically Signed   By: Andreas Newport M.D.   On: 05/11/2016 00:45    EKG: Independently reviewed. Normal sinus rhythm.  Assessment/Plan Principal Problem:   SIRS (systemic inflammatory response syndrome) (HCC) Active Problems:   Coronary artery disease   Hyperlipidemia   Hypertension   Multiple myeloma (HCC)   Colitis   Type 2 diabetes mellitus with vascular disease ([place])    1. SIRS secondary to colitis -  patient was initially given Cipro and Flagyl in the ER. Since highly suspicious for C. difficile I continued patient on IV Flagyl for now. Follow C. difficile panel GI pathogen panel and cultures. Continue with aggressive hydration. Patient is placed on clear liquid diet. Since patient has recently been on multiple doses of Decadron I have given 1 dose of hydrocortisone 50 mg for now. May need further doses. 2. CAD status post stenting in 2006 - patient has been having some exertional symptoms since patient's diarrhea started. EKG is unremarkable. Will check troponin. Patient is on statins and aspirin. Unable to give beta blocker secondary to low normal blood pressure. 3. Hypertension - holding off antihypertensives secondary to low normal blood pressure. 4. Diabetes mellitus type 2 - since patient has not been eating well from yesterday morning due to the abdominal pain and diarrhea will keep patient on sliding scale coverage only and hold off Lantus. 5. Anemia and thrombocytopenia most likely from chemotherapy. Follow CBC. 6. 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 7. Multiple myeloma being managed by [name] - oncologist. 8. Mandibular abscess - presently on Flagyl.   DVT prophylaxis: Lovenox. Code Status: Full code.  Family Communication: Patient's wife.  Disposition Plan: Home.  Consults called: None.  Admission status: Observation.    Rise Patience MD [facility] Pager [phone].  If 7PM-7AM, please contact night-coverage [email or web address] Password [facility]  05/12/2016, 5:33 AM

## 2016-05-12 NOTE — ED Notes (Signed)
Patient transported to X-ray and CT scan.  

## 2016-05-13 ENCOUNTER — Inpatient Hospital Stay (HOSPITAL_COMMUNITY): Payer: Medicare HMO

## 2016-05-13 ENCOUNTER — Other Ambulatory Visit: Payer: Medicare HMO

## 2016-05-13 ENCOUNTER — Ambulatory Visit: Payer: Medicare HMO | Admitting: Hematology

## 2016-05-13 DIAGNOSIS — E860 Dehydration: Secondary | ICD-10-CM

## 2016-05-13 DIAGNOSIS — R000 Tachycardia, unspecified: Secondary | ICD-10-CM

## 2016-05-13 DIAGNOSIS — A0472 Enterocolitis due to Clostridium difficile, not specified as recurrent: Secondary | ICD-10-CM

## 2016-05-13 DIAGNOSIS — K5100 Ulcerative (chronic) pancolitis without complications: Secondary | ICD-10-CM

## 2016-05-13 DIAGNOSIS — D649 Anemia, unspecified: Secondary | ICD-10-CM

## 2016-05-13 DIAGNOSIS — C9000 Multiple myeloma not having achieved remission: Secondary | ICD-10-CM

## 2016-05-13 DIAGNOSIS — R197 Diarrhea, unspecified: Secondary | ICD-10-CM

## 2016-05-13 DIAGNOSIS — N179 Acute kidney failure, unspecified: Secondary | ICD-10-CM

## 2016-05-13 DIAGNOSIS — R739 Hyperglycemia, unspecified: Secondary | ICD-10-CM

## 2016-05-13 HISTORY — DX: Tachycardia, unspecified: R00.0

## 2016-05-13 LAB — BASIC METABOLIC PANEL
Anion gap: 10 (ref 5–15)
Anion gap: 7 (ref 5–15)
BUN: 38 mg/dL — AB (ref 6–20)
BUN: 39 mg/dL — ABNORMAL HIGH (ref 6–20)
CALCIUM: 7.6 mg/dL — AB (ref 8.9–10.3)
CHLORIDE: 107 mmol/L (ref 101–111)
CO2: 17 mmol/L — AB (ref 22–32)
CO2: 17 mmol/L — AB (ref 22–32)
CREATININE: 2.91 mg/dL — AB (ref 0.61–1.24)
CREATININE: 3.02 mg/dL — AB (ref 0.61–1.24)
Calcium: 7.5 mg/dL — ABNORMAL LOW (ref 8.9–10.3)
Chloride: 102 mmol/L (ref 101–111)
GFR calc non Af Amer: 20 mL/min — ABNORMAL LOW (ref >60)
GFR calc non Af Amer: 19 mL/min — ABNORMAL LOW (ref >60)
GFR, EST AFRICAN AMERICAN: 23 mL/min — AB (ref >60)
GFR, EST AFRICAN AMERICAN: 22 mL/min — AB (ref >60)
GLUCOSE: 298 mg/dL — AB (ref 65–99)
Glucose, Bld: 494 mg/dL — ABNORMAL HIGH (ref 65–99)
Potassium: 3.9 mmol/L (ref 3.5–5.1)
Potassium: 3.8 mmol/L (ref 3.5–5.1)
Sodium: 129 mmol/L — ABNORMAL LOW (ref 135–145)
Sodium: 131 mmol/L — ABNORMAL LOW (ref 135–145)

## 2016-05-13 LAB — URINALYSIS, ROUTINE W REFLEX MICROSCOPIC
BACTERIA UA: NONE SEEN
BILIRUBIN URINE: NEGATIVE
Glucose, UA: >=500 mg/dL — AB
KETONES UR: NEGATIVE mg/dL
Nitrite: NEGATIVE
Protein, ur: 30 mg/dL — AB
SPECIFIC GRAVITY, URINE: 1.027 (ref 1.005–1.030)
SQUAMOUS EPITHELIAL / LPF: NONE SEEN
pH: 5 (ref 5.0–8.0)

## 2016-05-13 LAB — CBC
HCT: 28.8 % — ABNORMAL LOW (ref 39.0–52.0)
Hemoglobin: 10.5 g/dL — ABNORMAL LOW (ref 13.0–17.0)
MCH: 28.9 pg (ref 26.0–34.0)
MCHC: 36.5 g/dL — AB (ref 30.0–36.0)
MCV: 79.3 fL (ref 78.0–100.0)
PLATELETS: 98 10*3/uL — AB (ref 150–400)
RBC: 3.63 MIL/uL — AB (ref 4.22–5.81)
RDW: 17.5 % — AB (ref 11.5–15.5)
WBC: 11.1 10*3/uL — ABNORMAL HIGH (ref 4.0–10.5)

## 2016-05-13 LAB — GLUCOSE, CAPILLARY
GLUCOSE-CAPILLARY: 383 mg/dL — AB (ref 65–99)
GLUCOSE-CAPILLARY: 457 mg/dL — AB (ref 65–99)
Glucose-Capillary: 230 mg/dL — ABNORMAL HIGH (ref 65–99)
Glucose-Capillary: 278 mg/dL — ABNORMAL HIGH (ref 65–99)
Glucose-Capillary: 327 mg/dL — ABNORMAL HIGH (ref 65–99)
Glucose-Capillary: 375 mg/dL — ABNORMAL HIGH (ref 65–99)
Glucose-Capillary: 420 mg/dL — ABNORMAL HIGH (ref 65–99)
Glucose-Capillary: 436 mg/dL — ABNORMAL HIGH (ref 65–99)

## 2016-05-13 LAB — LACTIC ACID, PLASMA
LACTIC ACID, VENOUS: 1.6 mmol/L (ref 0.5–1.9)
Lactic Acid, Venous: 1.9 mmol/L (ref 0.5–1.9)

## 2016-05-13 LAB — SODIUM, URINE, RANDOM

## 2016-05-13 LAB — CREATININE, URINE, RANDOM: Creatinine, Urine: 187.37 mg/dL

## 2016-05-13 MED ORDER — HYDROMORPHONE HCL 1 MG/ML IJ SOLN
0.5000 mg | INTRAMUSCULAR | Status: DC | PRN
  Administered 2016-05-13 – 2016-05-15 (×5): 0.5 mg via INTRAVENOUS
  Filled 2016-05-13 (×5): qty 1

## 2016-05-13 MED ORDER — INSULIN ASPART 100 UNIT/ML ~~LOC~~ SOLN
8.0000 [IU] | Freq: Once | SUBCUTANEOUS | Status: AC
  Administered 2016-05-13: 8 [IU] via SUBCUTANEOUS

## 2016-05-13 MED ORDER — SODIUM CHLORIDE 0.9 % IV BOLUS (SEPSIS)
1000.0000 mL | Freq: Once | INTRAVENOUS | Status: AC
  Administered 2016-05-13: 1000 mL via INTRAVENOUS

## 2016-05-13 MED ORDER — DEXTROSE-NACL 5-0.9 % IV SOLN
INTRAVENOUS | Status: DC
  Administered 2016-05-13: 01:00:00 via INTRAVENOUS

## 2016-05-13 MED ORDER — GLUCERNA SHAKE PO LIQD
237.0000 mL | Freq: Three times a day (TID) | ORAL | Status: DC
  Administered 2016-05-13 – 2016-05-19 (×5): 237 mL via ORAL

## 2016-05-13 MED ORDER — INSULIN DETEMIR 100 UNIT/ML ~~LOC~~ SOLN
20.0000 [IU] | Freq: Every day | SUBCUTANEOUS | Status: DC
  Administered 2016-05-13 – 2016-05-14 (×2): 20 [IU] via SUBCUTANEOUS
  Filled 2016-05-13 (×2): qty 0.2

## 2016-05-13 MED ORDER — INSULIN ASPART 100 UNIT/ML ~~LOC~~ SOLN
15.0000 [IU] | Freq: Once | SUBCUTANEOUS | Status: AC
Start: 2016-05-13 — End: 2016-05-13
  Administered 2016-05-13: 15 [IU] via SUBCUTANEOUS

## 2016-05-13 MED ORDER — SODIUM CHLORIDE 0.9 % IV BOLUS (SEPSIS): 1000.0000 mL | Freq: Once | INTRAVENOUS | Status: DC

## 2016-05-13 MED ORDER — METOPROLOL SUCCINATE ER 25 MG PO TB24: 75.0000 mg | ORAL_TABLET | Freq: Three times a day (TID) | ORAL | Status: DC

## 2016-05-13 MED ORDER — ENOXAPARIN SODIUM 30 MG/0.3ML ~~LOC~~ SOLN
30.0000 mg | SUBCUTANEOUS | Status: DC
  Administered 2016-05-14 – 2016-05-21 (×7): 30 mg via SUBCUTANEOUS
  Filled 2016-05-13 (×8): qty 0.3

## 2016-05-13 MED ORDER — METOPROLOL TARTRATE 5 MG/5ML IV SOLN
5.0000 mg | Freq: Four times a day (QID) | INTRAVENOUS | Status: DC | PRN
  Administered 2016-05-13: 5 mg via INTRAVENOUS
  Filled 2016-05-13 (×2): qty 5

## 2016-05-13 MED ORDER — BACID PO TABS
2.0000 | ORAL_TABLET | Freq: Three times a day (TID) | ORAL | Status: DC
  Administered 2016-05-14 – 2016-05-26 (×32): 2 via ORAL
  Filled 2016-05-13 (×39): qty 2

## 2016-05-13 MED ORDER — METOPROLOL SUCCINATE ER 50 MG PO TB24
75.0000 mg | ORAL_TABLET | Freq: Every day | ORAL | Status: DC
  Administered 2016-05-13 – 2016-05-31 (×16): 75 mg via ORAL
  Filled 2016-05-13 (×18): qty 1

## 2016-05-13 MED ORDER — ADULT MULTIVITAMIN W/MINERALS CH
1.0000 | ORAL_TABLET | Freq: Every day | ORAL | Status: DC
  Administered 2016-05-14 – 2016-05-19 (×6): 1 via ORAL
  Filled 2016-05-13 (×6): qty 1

## 2016-05-13 MED ORDER — INSULIN ASPART 100 UNIT/ML ~~LOC~~ SOLN
12.0000 [IU] | Freq: Once | SUBCUTANEOUS | Status: AC
  Administered 2016-05-13: 12 [IU] via SUBCUTANEOUS

## 2016-05-13 MED ORDER — SODIUM CHLORIDE 0.9 % IV SOLN
INTRAVENOUS | Status: DC
  Administered 2016-05-13 – 2016-05-14 (×5): via INTRAVENOUS

## 2016-05-13 MED ORDER — BOOST / RESOURCE BREEZE PO LIQD
1.0000 | Freq: Three times a day (TID) | ORAL | Status: DC
  Administered 2016-05-13: 1 via ORAL

## 2016-05-13 NOTE — Progress Notes (Signed)
Inpatient Diabetes Program Recommendations  AACE/ADA: New Consensus Statement on Inpatient Glycemic Control (2015)  Target Ranges:  Prepandial:   less than 140 mg/dL      Peak postprandial:   less than 180 mg/dL (1-2 hours)      Critically ill patients:  140 - 180 mg/dL   Lab Results  Component Value Date   GLUCAP 375 (H) 05/13/2016   HGBA1C 9.7 (H) 11/13/2012    Review of Glycemic Control:  Results for PETR, BONTEMPO (MRN [id number]) as of 05/13/2016 13:46  Ref. Range 05/13/2016 01:33 05/13/2016 04:38 05/13/2016 08:29 05/13/2016 10:04 05/13/2016 11:51  Glucose-Capillary Latest Ref Range: 65 - 99 mg/dL 383 (H) 457 (H) 436 (H) 420 (H) 375 (H)   Inpatient Diabetes Program Recommendations:    Consider increasing Lantus to 20 units daily.  Note that patient resistant to insulins.  Will continue to follow.  If blood sugars remain>350 mg/dL, consider IV insulin.   Thanks, Adah Perl, RN, BC-ADM Inpatient Diabetes Coordinator Pager [phone] (8a-5p)

## 2016-05-13 NOTE — Progress Notes (Signed)
[name]   HEMATOLOGY/ONCOLOGY INPATIENT PROGRESS NOTE  Date of Service: 05/13/2016  Inpatient Attending: .Janece Canterbury, MD   SUBJECTIVE  Douglas Rose is my patient from clinic with recently diagnosed multiple myeloma on on CyBorD chemotherapy. He recently completed his 1st cycle of treatment with good response to treatment thus far. He recently received antibiotics for a periodontal abscess and is now currently admitted with C. difficile colitis, dehydration, acute kidney injury and uncontrolled hyperglycemia. He notes that his nausea settled down and his diarrhea is starting to settle. He noted poor oral intake for couple of days. Today was cycle 2 Day 4 of his treatment and this was understandably held. The patient is on treatment with IV Flagyl and oral vancomycin. Notes that his nausea and diarrhea are starting to settle down. No other acute symptoms at this time. Encouraged to hydrate as much as possible orally. Also receiving IV fluids.    OBJECTIVE:  NAD  PHYSICAL EXAMINATION: . Vitals:   05/13/16 0605 05/13/16 0608 05/13/16 1344 05/13/16 2133  BP: 131/72  119/82 131/78  Pulse: (!) 129  (!) 107 (!) 104  Resp: 18  17 18   Temp:  98.7 F (37.1 C) 97.6 F (36.4 C) 97.8 F (36.6 C)  TempSrc: Oral Oral Oral Oral  SpO2: 97%  100% 99%  Weight:      Height:       Filed Weights   05/11/16 2353 05/12/16 0501  Weight: 168 lb (76.2 kg) 163 lb (73.9 kg)   .Body mass index is 22.73 kg/m.  GENERAL:alert, in no acute distress and comfortable SKIN: skin color, texture, turgor are normal, no rashes or significant lesions EYES: normal, conjunctiva are pink and non-injected, sclera clear OROPHARYNX:no exudate, no erythema and lips, buccal mucosa, and tongue normal  NECK: supple, no JVD, thyroid normal size, non-tender, without nodularity LYMPH:  no palpable lymphadenopathy in the cervical, axillary or inguinal LUNGS: clear to auscultation with normal respiratory effort HEART: regular  rate & rhythm,  no murmurs and no lower extremity edema ABDOMEN: abdomen soft, mild non focal soreness to palpation, normoactive bowel sounds  Musculoskeletal: no cyanosis of digits and no clubbing  PSYCH: alert & oriented x 3 with fluent speech NEURO: no focal motor/sensory deficits  MEDICAL HISTORY:  Past Medical History:  Diagnosis Date  . CAD (coronary artery disease) 2006   stent LAD 3.0x18 [place], occluded RCA  . Cancer ([place])    multiple myeloma  . Diabetes mellitus without complication ([place])   . H/O non-insulin dependent diabetes mellitus   . Hyperlipidemia   . Hypertension     SURGICAL HISTORY: Past Surgical History:  Procedure Laterality Date  . CARDIAC CATHETERIZATION  2006    SOCIAL HISTORY: Social History   Social History  . Marital status: Married    Spouse name: N/A  . Number of children: 6  . Years of education: N/A   Occupational History  . retired Company secretary    Social History Main Topics  . Smoking status: Never Smoker  . Smokeless tobacco: Never Used  . Alcohol use No  . Drug use: No  . Sexual activity: Not Currently   Other Topics Concern  . Not on file   Social History Narrative  . No narrative on file    FAMILY HISTORY: Family History  Problem Relation Age of Onset  . Arrhythmia Mother   . Diabetes Sister   . Diabetes Brother   . Diabetes Brother     ALLERGIES:  has No Known Allergies.  MEDICATIONS:  Scheduled Meds: . acyclovir  200 mg Oral BID  . aspirin EC  81 mg Oral Daily  . atorvastatin  20 mg Oral Daily  . [START ON 05/14/2016] enoxaparin (LOVENOX) injection  30 mg Subcutaneous Q24H  . feeding supplement  1 Container Oral TID BM  . insulin aspart  0-15 Units Subcutaneous Q4H  . insulin detemir  5 Units Subcutaneous QHS  . metoprolol succinate  75 mg Oral Daily  . [START ON 05/14/2016] multivitamin with minerals  1 tablet Oral Daily  . vancomycin  500 mg Oral Q6H   Continuous Infusions: . sodium chloride 125 mL/hr at  05/13/16 0737  . metronidazole 500 mg (05/13/16 1427)   PRN Meds:.acetaminophen **OR** acetaminophen, HYDROmorphone (DILAUDID) injection, metoprolol, nitroGLYCERIN, ondansetron **OR** ondansetron (ZOFRAN) IV  REVIEW OF SYSTEMS:    10 Point review of Systems was done is negative except as noted above.   LABORATORY DATA:  I have reviewed the data as listed  . CBC Latest Ref Rng & Units 05/13/2016 05/12/2016 05/12/2016  WBC 4.0 - 10.5 K/uL 11.1(H) 13.5(H) 12.3(H)  Hemoglobin 13.0 - 17.0 g/dL 10.5(L) 10.3(L) 11.0(L)  Hematocrit 39.0 - 52.0 % 28.8(L) 30.2(L) 31.8(L)  Platelets 150 - 400 K/uL 98(L) 102(L) 117(L)    . CMP Latest Ref Rng & Units 05/13/2016 05/13/2016 05/12/2016  Glucose 65 - 99 mg/dL 298(H) 494(H) 202(H)  BUN 6 - 20 mg/dL 39(H) 38(H) 27(H)  Creatinine 0.61 - 1.24 mg/dL 3.02(H) 2.91(H) 1.88(H)  Sodium 135 - 145 mmol/L 131(L) 129(L) 134(L)  Potassium 3.5 - 5.1 mmol/L 3.8 3.9 3.3(L)  Chloride 101 - 111 mmol/L 107 102 104  CO2 22 - 32 mmol/L 17(L) 17(L) 17(L)  Calcium 8.9 - 10.3 mg/dL 7.5(L) 7.6(L) 7.7(L)  Total Protein 6.5 - 8.1 g/dL - - 4.4(L)  Total Bilirubin 0.3 - 1.2 mg/dL - - 1.0  Alkaline Phos 38 - 126 U/L - - 45  AST 15 - 41 U/L - - 14(L)  ALT 17 - 63 U/L - - 25    RADIOGRAPHIC STUDIES: I have personally reviewed the radiological images as listed and agreed with the findings in the report. Dg Chest 2 View  Result Date: 05/12/2016 CLINICAL DATA:  Diffuse abdominal pain and fever tonight, chest pain. EXAM: CHEST  2 VIEW COMPARISON:  Chest radiograph May 11, 2016 FINDINGS: Cardiomediastinal silhouette is normal. No pleural effusions or focal consolidations. Trachea projects midline and there is no pneumothorax. Soft tissue planes and included osseous structures are non-suspicious. Multiple EKG lines overlie the patient and may obscure subtle underlying pathology. IMPRESSION: Stable examination:  No acute cardiopulmonary process. Electronically Signed   By: Elon Alas M.D.    On: 05/12/2016 02:57   Ct Abdomen Pelvis W Contrast  Result Date: 05/12/2016 CLINICAL DATA:  Constipation since yesterday, took MiraLax and now has uncontrollable diarrhea and vomiting. Diffuse abdominal pain. History of multiple myeloma, diabetes. EXAM: CT ABDOMEN AND PELVIS WITH CONTRAST TECHNIQUE: Multidetector CT imaging of the abdomen and pelvis was performed using the standard protocol following bolus administration of intravenous contrast. CONTRAST:  171m ISOVUE-300 IOPAMIDOL (ISOVUE-300) INJECTION 61% COMPARISON:  Abdominal radiograph May 11, 2016 and CT abdomen and pelvis April 21, 2016 FINDINGS: LOWER CHEST: Lung bases are clear. Included heart size is normal. No pericardial effusion. HEPATOBILIARY: Liver and gallbladder are normal. PANCREAS: Normal. SPLEEN: Normal. ADRENALS/URINARY TRACT: Kidneys are orthotopic, demonstrating symmetric enhancement. No nephrolithiasis, hydronephrosis or solid renal masses. Too small to characterize hypodensities in the kidneys bilaterally. The unopacified ureters  are normal in course and caliber. Delayed imaging through the kidneys demonstrates symmetric prompt contrast excretion within the proximal urinary collecting system. Urinary bladder is partially distended. Normal adrenal glands. STOMACH/BOWEL: Severe general colonic wall thickening and edema with pericolonic inflammation. Very small hiatal hernia. The stomach, small bowel are normal in course and caliber without inflammatory changes. Normal appendix. VASCULAR/LYMPHATIC: Aortoiliac vessels are normal in course and caliber, mild to moderate calcific atherosclerosis. No lymphadenopathy by CT size criteria. REPRODUCTIVE: Prostatomegaly invading the base of the bladder. OTHER: Small to moderate low-density free fluid without focal fluid collection or intraperitoneal free air. MUSCULOSKELETAL: Nonacute. Bridging RIGHT sacroiliac osteophyte. Severe L5-S1 facet arthropathy. IMPRESSION: Severe colitis, recommend  correlation with stool sampling (Clostridium difficile infection can have this appearance). Small to moderate ascites. Electronically Signed   By: Elon Alas M.D.   On: 05/12/2016 02:54   US Renal  Result Date: 05/13/2016 CLINICAL DATA:  Acute kidney injury 05/08/2016. EXAM: RENAL / URINARY TRACT ULTRASOUND COMPLETE COMPARISON:  CT abdomen and pelvis 05/12/2016. FINDINGS: Right Kidney: Length: 10.2 cm. Echogenicity within normal limits. No mass or hydronephrosis visualized. Left Kidney: Length: 10.3 cm. Echogenicity within normal limits. No mass or hydronephrosis visualized. Bladder: Appears normal for degree of bladder distention. Small bilateral pleural effusions and ascites are identified. IMPRESSION: Negative for hydronephrosis.  Normal appearing kidneys. Ascites and small bilateral pleural effusions Electronically Signed   By: Inge Rise M.D.   On: 05/13/2016 12:36   Dg Abd Acute W/chest  Result Date: 05/11/2016 CLINICAL DATA:  Abdominal pain and constipation EXAM: DG ABDOMEN ACUTE W/ 1V CHEST COMPARISON:  None. FINDINGS: There is a generous volume of air an small bowel, more likely nonobstructive. No extraluminal gas. Stool and air is present in the colon. No biliary or urinary calculi. Upright view of the chest is negative for acute cardiopulmonary abnormality. IMPRESSION: Generous volume of air throughout small bowel. There also is stool and air in the colon. No evidence of bowel obstruction or perforation. Electronically Signed   By: Andreas Newport M.D.   On: 05/11/2016 00:45   Ct Renal Stone Study  Result Date: 04/21/2016 CLINICAL DATA:  71 year old hypertensive male with left flank pain starting last night. Abdominal bloating. New diagnosis of multiple myeloma 3 weeks ago. Chemotherapy in progress. Initial encounter. EXAM: CT ABDOMEN AND PELVIS WITHOUT CONTRAST TECHNIQUE: Multidetector CT imaging of the abdomen and pelvis was performed following the standard protocol without IV  contrast. COMPARISON:  03/02/2016 CT and ultrasound. FINDINGS: Lower chest: Tiny granuloma left lung base. Minimal basilar atelectatic changes. Coronary artery calcifications suspected.  Heart size top-normal. Hepatobiliary: Taking into account limitation by non contrast imaging, no hepatic lesion. No calcified gallstone. Gallbladder sludge or noncalcified stones noted. Pancreas: Taking into account limitation by non contrast imaging, no mass or focal inflammation. Slight third spacing of fluid limits evaluation. Spleen: Taking into account limitation by non contrast imaging, no mass or enlargement. Adrenals/Urinary Tract: No renal or ureteral obstructing stone or evidence hydronephrosis. Taking into account limitation by non contrast imaging, no renal or adrenal lesion. Mild impression upon the bladder base by slightly lobulated prostate gland. Stomach/Bowel: Scattered diverticula without surrounding inflammation. Mid to distal appendix top-normal in size with minimal haziness of adjacent fat planes. In the proper clinical setting, very early appendicitis cannot be excluded based on imaging. This represents a change from prior CT. Portions of stomach, small bowel and colon are under distended and evaluation limited. Vascular/Lymphatic: Atherosclerotic changes aorta, iliac arteries, femoral arteries and aortic branch  vessels without aneurysm. Scattered small lymph nodes. Reproductive: Enlarged slightly lobulated prostate gland with impression upon the bladder base. Clinical laboratory correlation recommended. Other: Mild third spacing of fluid. Musculoskeletal: Subtle lucency left aspect L3 vertebral body (Series 2, image 34) may represent result of myeloma. Mild degenerative changes lumbar spine and hip joints. Small well-circumscribed lucency right intertrochanteric region felt to be incidental finding. IMPRESSION: No renal or ureteral obstructing stone or evidence hydronephrosis. Mid to distal appendix top-normal  in size with minimal haziness of adjacent fat planes. In the proper clinical setting, very early appendicitis cannot be excluded. The minimal haziness of fat planes may be related to third spacing of fluid which is new from the prior exam. Scattered colonic diverticula without surrounding inflammation. Gallbladder sludge/ noncalcified stones noted. Subtle lucency left aspect L3 vertebral body (Series 2, image 34) may represent result of myeloma. Slightly lobulated prostate gland impresses upon the bladder base. Clinical and laboratory correlation recommended. Aortic atherosclerosis. Coronary artery calcifications. Call is into Dr. Zenia Resides (via [facility]) currently on phone. Electronically Signed   By: Genia Del M.D.   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 Anemia + Renal insufficiency. Bone survey neg for concerning bone lesions. significantly elevated kappafree light chains 858 mg percent, lambda 18.4, with a ratio of 46.65 . He was also noted to have an M spike of 1.7 g/dL with IFE showing IgG kappa monoclonal protein. Quantitative IgG level was increased to nearly 2500 mg/dL. 24-hour UPEP showed monoclonal protein of 1300 mg per 24 hours which constituted about 86% of all the urinary protein. Bone marrow biopsy showed 20-30% Restricted plasma cells consistent with multiple myeloma Cytogenetics/FISH  Showed   2) acute kidney injury likely due to dehydration from the patient's oral intake and hyperglycemia and diarrhea. Also had an IV contrast load with his CT of the abdomen on 05/12/2016. Previously noted to have Subacute renal failure primary appears to be related to monoclonal paraproteinemia. Had an element of obstructive uropathy due to BPH which has resolved.  3) Normocytic anemia likely related to multiple myeloma.-stable, improving 4) pancolitis due  to Clostridium difficile infection. Recent clindamycin use for his periodontal abscess was likely risk factor so is his immunosuppression from myeloma treatment to some extent.  5) Plan -Aggressive treatment of Clostridium difficile diarrhea as is being done already with oralvancomycin and IV Flagyl.  -Aggressive IV fluids. -? Role for Mucomyst for nephro protection from IV contrast. Would avoid future IV contrast studies unless absolutely needed. -Avoid any nephrotoxic agents -Rule out possibility of some element of urinary obstruction with post void residual -Uncontrolled hyperglycemia - will need optimization of his diabetes control -Will hold his myeloma treatment at this time including improvement in clinical condition   I spent 30 minutes counseling the patient face to face. The total time spent in the appointment was 40 minutes and more than 50% was on counseling and direct patient cares.    Sullivan Lone MD [place] AAHIVMS [facility] Hematology/Oncology Physician [facility]  (Office):       [phone] (Work cell):  [phone] (Fax):           [phone]  05/13/2016 3:37 PM

## 2016-05-13 NOTE — Progress Notes (Signed)
PROGRESS NOTE  Douglas Rose.  IPJ:[id number] DOB: 1945-02-02 DOA: 05/11/2016 PCP: [name], MD  Brief Narrative:   Douglas Rose. is a 71 y.o. male with history of multiple myeloma on chemotherapy, recent history of C. Diff colitis, who was placed on clindamycin for the last 3 weeks for mandibular abscess who presented to the ER with complaints of diffuse crampy abdominal pain with multiple episodes of diarrhea.  He was found to have sepsis due to C. Diff colitis.  His course has been complicated by ongoing sinus tachycardia and AKI.   Assessment & Plan:   Principal Problem:   SIRS (systemic inflammatory response syndrome) (HCC) Active Problems:   Coronary artery disease   Hyperlipidemia   Hypertension   Multiple myeloma (HCC)   Colitis   Type 2 diabetes mellitus with vascular disease ([place])  Sepsis secondary to C. Diff colitis with improving leukocytosis, but persistent tachycardia and AKI.  Ongoing emesis with inability to tolerate more than a few sips of water and frequent diarrhea.   - continue IV flagyl - continue oral vancomycin day 2 - start IV dilaudid for pain control -  Continue zofran  Sinus tachycardia, likely related to sepsis and underresuscitation and holding beta blocker -  2L NS bolus -  Change to normal saline IVF -  Check lactic acid -  TSH likely to be abnormal in setting of acute severe infection -  Doubt that this is due to adrenal insufficiency as BP are currently stable -  Resume metoprolol  Diabetes mellitus type 2 with hyperglycemia  -  D/c dextrose fluids as tolerating at least some PO -  Stopped steroids after initial dose of hydrocortisone was given at admission -  Continue levemir -  Continue moderate dose SSI -  Will check q2 h CBG todays and give additional insulin as needed to get CBG to 140-180 range today  Acute on chronic kidney disease stage 3 due to MM and DM, likely due to dehydration and sepsis.  Creatinine up to 2.91,  baseline of 1.5 -  Bladder scan:  Only a little more than 257m retained -  RUS -  FENa -  Increase IVF, boluses -  Repeat BMP in AM -  Not on ACE/ARB or NSAIDS  Hypertension, blood pressures stable enough to resume metoprolol at this time  CAD s/p stenting in 2006, chest pain free -  Troponin negative -  Aspirin, statin, and beta blocker  Anemia and thrombocytopenia most likely from chemotherapy and MM - repeat CBC in AM  MM, followed by Dr. KIrene Limbo  Mandibular abscess, continue flagyl  Hypomagnesemia due to diarrhea -  IV magnesium repletion  Hyponatremia due to dehydration and hyperglycemia -  IVF and correct hyperglycemia  DVT prophylaxis: Lovenox. Code Status: Full code.  Family Communication: Patient alone Disposition Plan: Home in several more days.  Patient still in serious condition with tachycardia, AKI, inability to tolerate PO, ongoing vomiting and diarrhea.   Consultants:   none  Procedures:  none  Antimicrobials:  Anti-infectives    Start     Dose/Rate Route Frequency Ordered Stop   05/12/16 1000  acyclovir (ZOVIRAX) 200 MG capsule 200 mg     200 mg Oral 2 times daily 05/12/16 0532     05/12/16 0900  vancomycin (VANCOCIN) 50 mg/mL oral solution 500 mg     500 mg Oral Every 6 hours 05/12/16 0748 05/26/16 0859   05/12/16 0600  metroNIDAZOLE (FLAGYL) IVPB 500 mg  500 mg 100 mL/hr over 60 Minutes Intravenous Every 8 hours 05/12/16 0532     05/12/16 0330  metroNIDAZOLE (FLAGYL) tablet 500 mg     500 mg Oral  Once 05/12/16 0327 05/12/16 0338   05/12/16 0330  ciprofloxacin (CIPRO) tablet 500 mg     500 mg Oral  Once 05/12/16 0327 05/12/16 0338       Subjective: Feeling very unwell.  Frequent vomiting nad diarrhea which often occur at the same time.  Only had sips of water yesterday.  Denies chest pains, but having ongoing tachycardia on telemetry.  Feels very thirsty.    Objective: Vitals:   05/12/16 1510 05/12/16 2140 05/13/16 0605 05/13/16  0608  BP: 134/69 102/75 131/72   Pulse: (!) 120 (!) 140 (!) 129   Resp: 18 17 18    Temp: 98.6 F (37 C) 97.9 F (36.6 C)  98.7 F (37.1 C)  TempSrc:  Oral Oral Oral  SpO2: 98% 100% 97%   Weight:      Height:        Intake/Output Summary (Last 24 hours) at 05/13/16 1323 Last data filed at 05/13/16 0900  Gross per 24 hour  Intake          1310.83 ml  Output                0 ml  Net          1310.83 ml   Filed Weights   05/11/16 2353 05/12/16 0501  Weight: 76.2 kg (168 lb) 73.9 kg (163 lb)    Examination:  General exam:  Adult male.  No acute distress.  HEENT:  NCAT, dry tongue Respiratory system: Clear to auscultation bilaterally Cardiovascular system: tachycardic to 120s, normal S1/S2. No murmurs, rubs, gallops or clicks.  Warm extremities Gastrointestinal system: Hyperactive bowel sounds, soft, moderately distended and TTP diffusely without rebound or guarding MSK:  Normal tone and bulk, no lower extremity edema Neuro:  Grossly intact    Data Reviewed: I have personally reviewed following labs and imaging studies  CBC:  Recent Labs Lab 05/10/16 2258 05/12/16 0050 05/12/16 0533 05/13/16 0549  WBC 6.0 12.3* 13.5* 11.1*  NEUTROABS  --  10.9* 11.8*  --   HGB 10.6* 11.0* 10.3* 10.5*  HCT 31.2* 31.8* 30.2* 28.8*  MCV 80.8 80.1 80.5 79.3  PLT 126* 117* 102* 98*   Basic Metabolic Panel:  Recent Labs Lab 05/10/16 2258 05/12/16 0050 05/12/16 0533 05/12/16 0715 05/13/16 0549  NA 134* 134* 134*  --  129*  K 3.3* 3.7 3.3*  --  3.9  CL 102 101 104  --  102  CO2 20* 23 17*  --  17*  GLUCOSE 310* 148* 202*  --  494*  BUN 18 25* 27*  --  38*  CREATININE 1.67* 1.89* 1.88*  --  2.91*  CALCIUM 8.4* 8.4* 7.7*  --  7.6*  MG  --   --   --  1.6*  --    GFR: Estimated Creatinine Clearance: 24 mL/min (A) (by C-G formula based on SCr of 2.91 mg/dL (H)). Liver Function Tests:  Recent Labs Lab 05/10/16 2258 05/12/16 0050 05/12/16 0533  AST 19 15 14*  ALT 42 31  25  ALKPHOS 48 48 45  BILITOT 0.8 0.8 1.0  PROT 5.8* 5.0* 4.4*  ALBUMIN 2.8* 2.4* 2.1*    Recent Labs Lab 05/12/16 0050  LIPASE 16   No results for input(s): AMMONIA in the last 168 hours. Coagulation Profile:  No results for input(s): INR, PROTIME in the last 168 hours. Cardiac Enzymes:  Recent Labs Lab 05/12/16 0533 05/12/16 1133 05/12/16 1915  TROPONINI 0.07* 0.10* 0.06*   BNP (last 3 results) No results for input(s): PROBNP in the last 8760 hours. HbA1C: No results for input(s): HGBA1C in the last 72 hours. CBG:  Recent Labs Lab 05/13/16 0133 05/13/16 0438 05/13/16 0829 05/13/16 1004 05/13/16 1151  GLUCAP 383* 457* 436* 420* 375*   Lipid Profile: No results for input(s): CHOL, HDL, LDLCALC, TRIG, CHOLHDL, LDLDIRECT in the last 72 hours. Thyroid Function Tests: No results for input(s): TSH, T4TOTAL, FREET4, T3FREE, THYROIDAB in the last 72 hours. Anemia Panel: No results for input(s): VITAMINB12, FOLATE, FERRITIN, TIBC, IRON, RETICCTPCT in the last 72 hours. Urine analysis:    Component Value Date/Time   COLORURINE YELLOW 05/12/2016 0400   APPEARANCEUR CLEAR 05/12/2016 0400   LABSPEC 1.031 (H) 05/12/2016 0400   PHURINE 5.0 05/12/2016 0400   GLUCOSEU NEGATIVE 05/12/2016 0400   HGBUR NEGATIVE 05/12/2016 0400   BILIRUBINUR NEGATIVE 05/12/2016 0400   KETONESUR 5 (A) 05/12/2016 0400   PROTEINUR 30 (A) 05/12/2016 0400   NITRITE NEGATIVE 05/12/2016 0400   LEUKOCYTESUR NEGATIVE 05/12/2016 0400   Sepsis Labs: @LABRCNTIP (procalcitonin:4,lacticidven:4)  ) Recent Results (from the past 240 hour(s))  C difficile quick scan w PCR reflex     Status: Abnormal   Collection Time: 05/12/16  4:00 AM  Result Value Ref Range Status   C Diff antigen POSITIVE (A) NEGATIVE Final   C Diff toxin POSITIVE (A) NEGATIVE Final   C Diff interpretation Toxin producing C. difficile detected.  Final    Comment: RESULT CALLED TO, READ BACK BY AND VERIFIED WITH: KAYLA PRICE AT  [place] ON May 12 2016 BY [facility]       Radiology Studies: Dg Chest 2 View  Result Date: 05/12/2016 CLINICAL DATA:  Diffuse abdominal pain and fever tonight, chest pain. EXAM: CHEST  2 VIEW COMPARISON:  Chest radiograph May 11, 2016 FINDINGS: Cardiomediastinal silhouette is normal. No pleural effusions or focal consolidations. Trachea projects midline and there is no pneumothorax. Soft tissue planes and included osseous structures are non-suspicious. Multiple EKG lines overlie the patient and may obscure subtle underlying pathology. IMPRESSION: Stable examination:  No acute cardiopulmonary process. Electronically Signed   By: Elon Alas M.D.   On: 05/12/2016 02:57   Ct Abdomen Pelvis W Contrast  Result Date: 05/12/2016 CLINICAL DATA:  Constipation since yesterday, took MiraLax and now has uncontrollable diarrhea and vomiting. Diffuse abdominal pain. History of multiple myeloma, diabetes. EXAM: CT ABDOMEN AND PELVIS WITH CONTRAST TECHNIQUE: Multidetector CT imaging of the abdomen and pelvis was performed using the standard protocol following bolus administration of intravenous contrast. CONTRAST:  197m ISOVUE-300 IOPAMIDOL (ISOVUE-300) INJECTION 61% COMPARISON:  Abdominal radiograph May 11, 2016 and CT abdomen and pelvis April 21, 2016 FINDINGS: LOWER CHEST: Lung bases are clear. Included heart size is normal. No pericardial effusion. HEPATOBILIARY: Liver and gallbladder are normal. PANCREAS: Normal. SPLEEN: Normal. ADRENALS/URINARY TRACT: Kidneys are orthotopic, demonstrating symmetric enhancement. No nephrolithiasis, hydronephrosis or solid renal masses. Too small to characterize hypodensities in the kidneys bilaterally. The unopacified ureters are normal in course and caliber. Delayed imaging through the kidneys demonstrates symmetric prompt contrast excretion within the proximal urinary collecting system. Urinary bladder is partially distended. Normal adrenal glands. STOMACH/BOWEL: Severe general  colonic wall thickening and edema with pericolonic inflammation. Very small hiatal hernia. The stomach, small bowel are normal in course and caliber without inflammatory  changes. Normal appendix. VASCULAR/LYMPHATIC: Aortoiliac vessels are normal in course and caliber, mild to moderate calcific atherosclerosis. No lymphadenopathy by CT size criteria. REPRODUCTIVE: Prostatomegaly invading the base of the bladder. OTHER: Small to moderate low-density free fluid without focal fluid collection or intraperitoneal free air. MUSCULOSKELETAL: Nonacute. Bridging RIGHT sacroiliac osteophyte. Severe L5-S1 facet arthropathy. IMPRESSION: Severe colitis, recommend correlation with stool sampling (Clostridium difficile infection can have this appearance). Small to moderate ascites. Electronically Signed   By: Elon Alas M.D.   On: 05/12/2016 02:54   US Renal  Result Date: 05/13/2016 CLINICAL DATA:  Acute kidney injury 05/08/2016. EXAM: RENAL / URINARY TRACT ULTRASOUND COMPLETE COMPARISON:  CT abdomen and pelvis 05/12/2016. FINDINGS: Right Kidney: Length: 10.2 cm. Echogenicity within normal limits. No mass or hydronephrosis visualized. Left Kidney: Length: 10.3 cm. Echogenicity within normal limits. No mass or hydronephrosis visualized. Bladder: Appears normal for degree of bladder distention. Small bilateral pleural effusions and ascites are identified. IMPRESSION: Negative for hydronephrosis.  Normal appearing kidneys. Ascites and small bilateral pleural effusions Electronically Signed   By: Inge Rise M.D.   On: 05/13/2016 12:36     Scheduled Meds: . acyclovir  200 mg Oral BID  . aspirin EC  81 mg Oral Daily  . atorvastatin  20 mg Oral Daily  . enoxaparin (LOVENOX) injection  40 mg Subcutaneous Q24H  . insulin aspart  0-15 Units Subcutaneous Q4H  . insulin detemir  5 Units Subcutaneous QHS  . metoprolol succinate  75 mg Oral Daily  . vancomycin  500 mg Oral Q6H   Continuous Infusions: . sodium  chloride 125 mL/hr at 05/13/16 0737  . metronidazole Stopped (05/13/16 0617)     LOS: 1 day    Time spent: 30 min    Janece Canterbury, MD [facility] Pager [phone]  If 7PM-7AM, please contact night-coverage [email or web address] Password [email or web address] 05/13/2016, 1:23 PM

## 2016-05-13 NOTE — Progress Notes (Signed)
Initial Nutrition Assessment  DOCUMENTATION CODES:   Not applicable  INTERVENTION:   -Boost Breeze po TID, each supplement provides 250 kcal and 9 grams of protein -MVI daily  NUTRITION DIAGNOSIS:   Inadequate oral intake related to poor appetite, vomiting, nausea as evidenced by meal completion < 25%.  GOAL:   Patient will meet greater than or equal to 90% of their needs  MONITOR:   PO intake, Supplement acceptance, Labs, Weight trends, Skin, I & O's  REASON FOR ASSESSMENT:   Malnutrition Screening Tool    ASSESSMENT:   Douglas Rose. is a 71 y.o. male with history of multiple myeloma recently diagnosed and chemotherapy last chemotherapy 3 days ago and recently placed on clindamycin for the last 3 weeks for mandibular abscess presents to the ER with complaints of diffuse crampy abdominal pain with multiple episodes of diarrhea. Patient had one episode of nausea and vomiting. Denies any fever or chills. Over the last 4 weeks patient has been on chemotherapy for multiple myeloma. Patient states over the last 24 hours patient also had some exertional chest pressure. No chest pain on resting.   Pt admitted with sepsis secondary to C. Diff colitis with improving leukocytosis, but persistent tachycardia and AKI.   Pt was sleeping soundly at time of visit and did not arouse when name was called. Pt had just returned from ultrasound and was covered in multiple blankets. Abbreviated nutrition-focused physical exam revealed no signs of fat or muscle depletion.   Reviewed wt hx; wt has been stable.   Pt with very minimal intake (PO: 0-10%). Noted breakfast tray at bedside was unattempted.   Labs reviewed: CBGS: 375-420. Per DM coordinator note, recommending increasing Lantus to 20 units daily (pt is insulin resistant).   Diet Order:  Diet full liquid Room service appropriate? Yes; Fluid consistency: Thin  Skin:  Reviewed, no issues  Last BM:  05/13/16  Height:   Ht Readings  from Last 1 Encounters:  05/12/16 5' 11"  (1.803 m)    Weight:   Wt Readings from Last 1 Encounters:  05/12/16 163 lb (73.9 kg)    Ideal Body Weight:  78.2 kg  BMI:  Body mass index is 22.73 kg/m.  Estimated Nutritional Needs:   Kcal:  1800-2000  Protein:  90-105 grams  Fluid:  1.8-2.0 L  EDUCATION NEEDS:   Education needs addressed  Carling Liberman A. Jimmye Norman, RD, LDN, CDE Pager: [phone] After hours Pager: [phone]

## 2016-05-14 DIAGNOSIS — R000 Tachycardia, unspecified: Secondary | ICD-10-CM

## 2016-05-14 DIAGNOSIS — E785 Hyperlipidemia, unspecified: Secondary | ICD-10-CM

## 2016-05-14 LAB — BASIC METABOLIC PANEL
ANION GAP: 6 (ref 5–15)
BUN: 42 mg/dL — ABNORMAL HIGH (ref 6–20)
CALCIUM: 7.4 mg/dL — AB (ref 8.9–10.3)
CO2: 15 mmol/L — ABNORMAL LOW (ref 22–32)
CREATININE: 3.19 mg/dL — AB (ref 0.61–1.24)
Chloride: 112 mmol/L — ABNORMAL HIGH (ref 101–111)
GFR, EST AFRICAN AMERICAN: 21 mL/min — AB (ref >60)
GFR, EST NON AFRICAN AMERICAN: 18 mL/min — AB (ref >60)
Glucose, Bld: 158 mg/dL — ABNORMAL HIGH (ref 65–99)
Potassium: 3.7 mmol/L (ref 3.5–5.1)
SODIUM: 133 mmol/L — AB (ref 135–145)

## 2016-05-14 LAB — CBC
HEMATOCRIT: 32.4 % — AB (ref 39.0–52.0)
HEMOGLOBIN: 11.6 g/dL — AB (ref 13.0–17.0)
MCH: 28.4 pg (ref 26.0–34.0)
MCHC: 35.8 g/dL (ref 30.0–36.0)
MCV: 79.2 fL (ref 78.0–100.0)
Platelets: 117 10*3/uL — ABNORMAL LOW (ref 150–400)
RBC: 4.09 MIL/uL — ABNORMAL LOW (ref 4.22–5.81)
RDW: 17.9 % — ABNORMAL HIGH (ref 11.5–15.5)
WBC: 8.8 10*3/uL (ref 4.0–10.5)

## 2016-05-14 LAB — GLUCOSE, CAPILLARY
GLUCOSE-CAPILLARY: 147 mg/dL — AB (ref 65–99)
GLUCOSE-CAPILLARY: 145 mg/dL — AB (ref 65–99)
Glucose-Capillary: 170 mg/dL — ABNORMAL HIGH (ref 65–99)
Glucose-Capillary: 135 mg/dL — ABNORMAL HIGH (ref 65–99)
Glucose-Capillary: 146 mg/dL — ABNORMAL HIGH (ref 65–99)

## 2016-05-14 MED ORDER — INSULIN ASPART 100 UNIT/ML ~~LOC~~ SOLN
0.0000 [IU] | Freq: Three times a day (TID) | SUBCUTANEOUS | Status: DC
  Administered 2016-05-14 (×2): 2 [IU] via SUBCUTANEOUS
  Administered 2016-05-15: 3 [IU] via SUBCUTANEOUS

## 2016-05-14 MED ORDER — INSULIN ASPART 100 UNIT/ML ~~LOC~~ SOLN
5.0000 [IU] | Freq: Three times a day (TID) | SUBCUTANEOUS | Status: DC
  Administered 2016-05-15: 5 [IU] via SUBCUTANEOUS

## 2016-05-14 MED ORDER — INSULIN ASPART 100 UNIT/ML ~~LOC~~ SOLN: 0.0000 [IU] | Freq: Three times a day (TID) | SUBCUTANEOUS | Status: DC

## 2016-05-14 MED ORDER — STERILE WATER FOR INJECTION IV SOLN
INTRAVENOUS | Status: DC
  Administered 2016-05-14 – 2016-05-17 (×4): via INTRAVENOUS
  Filled 2016-05-14 (×9): qty 850

## 2016-05-14 MED ORDER — ALUM & MAG HYDROXIDE-SIMETH 200-200-20 MG/5ML PO SUSP
15.0000 mL | ORAL | Status: DC | PRN
  Administered 2016-05-14: 15 mL via ORAL
  Filled 2016-05-14: qty 30

## 2016-05-14 NOTE — Consult Note (Signed)
Douglas Rose. is an 71 y.o. male referred by Dr Douglas Rose   Chief Complaint: Acute on CKD 3 HPI: 71yo BM with hx of MM (on cytoxan, velcade and decadron) presented to ER 05/12/16 for abd pain and diarrhea.  He had been on clindamycin for last 3 weeks for mandibular abscess and now positive for C diff.  Baseline Scr mid to upper 1's (1.67 05/10/16) and on admission Scr was 1.89 and progressively increased to 3.19 today.  He had a contrasted CT of abdomen in the ER on 05/12/16 with some variably lowish BP.  UO has not been well recorded. UNa < 10.  I saw pt for initial renal consult in the office on 04/30/16 with CKD 3 most likely due to MM.  He also has hx DM x 6-23yr and HTN for 5 yrs.  Past Medical History:  Diagnosis Date  . CAD (coronary artery disease) 2006   stent LAD 3.0x18 [place], occluded RCA  . [place]    multiple [place]oma  . Diabetes mellitus without complication ([place]   . H/O non-insulin dependent diabetes mellitus   . Hyperlipidemia   . Hypertension     Past Surgical History:  Procedure Laterality Date  . CARDIAC CATHETERIZATION  2006    Family History  Problem Relation Age of Onset  . Arrhythmia Mother   . Diabetes Sister   . Diabetes Brother   . Diabetes Brother   FH neg renal ds  Social History:  reports that he has never smoked. He has never used smokeless tobacco. He reports that he does not drink alcohol or use drugs. Married, ex-smoker, quit 1968, retired  Allergies: No Known Allergies  Medications Prior to Admission  Medication Sig Dispense Refill  . acyclovir (ZOVIRAX) 200 MG capsule Take 1 capsule (200 mg total) by mouth 2 (two) times daily. 30 capsule 3  . amLODipine (NORVASC) 10 MG tablet Take 10 mg by mouth daily.    .[name] Kitchenaspirin 81 MG tablet Take 81 mg by mouth daily.    .[name] Kitchenatorvastatin (LIPITOR) 20 MG tablet Take 1 tablet (20 mg total) by mouth daily. 90 tablet 3  . chlorhexidine (PERIDEX) 0.12 % solution Use as directed 15 mLs in the mouth or throat 2  (two) times daily. 473 mL 0  . cyclophosphamide (CYTOXAN) 50 MG tablet Take 12 tablets (600 mg total) by mouth once a week. Take days 1,8,15 of chemo 1hr before or 2hr after meals every 21d. Capsules. ICD:C90.00 36 tablet 3  . dexamethasone (DECADRON) 4 MG tablet Take 5 tablets (20 mg) on days 1, 8, and 15 of chemo. Repeat every 21 days. Take 2 tablets (826m daily for 2 days after days1 and 8 of chemo 30 tablet 3  . docusate sodium (COLACE) 100 MG capsule Take 1 capsule (100 mg total) by mouth every 12 (twelve) hours. 60 capsule 0  . insulin aspart (NOVOLOG) 100 UNIT/ML FlexPen Three times a day before meals per sliding scale. between 2-6 on average    . Insulin Glargine (LANTUS SOLOSTAR) 100 UNIT/ML Solostar Pen Inject 5 Units into the skin daily.     . metoprolol succinate (TOPROL-XL) 25 MG 24 hr tablet Take 3 tablets (75 mg total) by mouth daily. Take with or immediately following a meal. 90 tablet 1  . nitroGLYCERIN (NITROSTAT) 0.4 MG SL tablet Place 1 tablet (0.4 mg total) under the tongue every 5 (five) minutes x 3 doses as needed for chest pain. 30 tablet 12  . ondansetron (ZOFRAN)  8 MG tablet Take 1 tablet (8 mg total) by mouth 2 (two) times daily as needed for refractory nausea / vomiting. 30 tablet 1  . prochlorperazine (COMPAZINE) 10 MG tablet Take 1 tablet (10 mg total) by mouth every 6 (six) hours as needed (Nausea or vomiting). 30 tablet 1  . tamsulosin (FLOMAX) 0.4 MG CAPS capsule Take 1 capsule (0.4 mg total) by mouth daily after supper. 30 capsule 2     Lab Results: UA: 0-5 WBC and rbc, 70m protein  Recent Labs  05/12/16 0533 05/13/16 0549 05/14/16 0724  WBC 13.5* 11.1* 8.8  HGB 10.3* 10.5* 11.6*  HCT 30.2* 28.8* 32.4*  PLT 102* 98* 117*   BMET  Recent Labs  05/13/16 0549 05/13/16 1719 05/14/16 0724  NA 129* 131* 133*  K 3.9 3.8 3.7  CL 102 107 112*  CO2 17* 17* 15*  GLUCOSE 494* 298* 158*  BUN 38* 39* 42*  CREATININE 2.91* 3.02* 3.19*  CALCIUM 7.6* 7.5*  7.4*   LFT  Recent Labs  05/12/16 0533  PROT 4.4*  ALBUMIN 2.1*  AST 14*  ALT 25  ALKPHOS 45  BILITOT 1.0   [place]  Result Date: 05/13/2016 CLINICAL DATA:  Acute kidney injury 05/08/2016. EXAM: RENAL / URINARY TRACT ULTRASOUND COMPLETE COMPARISON:  CT abdomen and pelvis 05/12/2016. FINDINGS: Right Kidney: Length: 10.2 cm. Echogenicity within normal limits. No mass or hydronephrosis visualized. Left Kidney: Length: 10.3 cm. Echogenicity within normal limits. No mass or hydronephrosis visualized. Bladder: Appears normal for degree of bladder distention. Small bilateral pleural effusions and ascites are identified. IMPRESSION: Negative for hydronephrosis.  Normal appearing kidneys. Ascites and small bilateral pleural effusions Electronically Signed   By: TInge RiseM.D.   On: 05/13/2016 12:36    ROS: Appetite poor + Chest tightness when he exerts himself No sob + abd bloating.   + diarrhea but lessening No dysuria  PHYSICAL EXAM: Blood pressure 137/82, pulse (!) 114, temperature 97.5 F (36.4 C), temperature source Oral, resp. rate 18, height _0  (1.803 m), weight 73.9 kg (163 lb), SpO2 98 %. HEENT: PERRLA EOMI NECK:No JVD LUNGS:Clear CARDIAC:RRR wo MRG ABD:+ BS Mildly distended. Minimal tenderness with deep palpation EXT:No edema NEURO:CNI Ox3 no asterixis  Assessment: 1. Acute on CKD 3.  Chronic portion most likely due to MM.  Acute portion most likely secondary contrast nephropathy 2. MM 3. HTN 4. DM 5. C diff colitis on vanco/flagyl 6. Met acidosis mostly due to C diff but ARF playing a role PLAN: 1. Change IV fluids to isotonic bicarb 2. Daily Scr 3. Cont AB for C diff 4. Discussed with pt the possibility of HD if renal fx were cont to worsen but hopefully Scr will level off and improve before that   Douglas Rose T 05/14/2016, 1:48 PM

## 2016-05-14 NOTE — Progress Notes (Signed)
05/14/2016 4:37 PM  Updated wife per patient's request about care plan. She verbalized understanding.    Murvin Natal, MD

## 2016-05-14 NOTE — Progress Notes (Signed)
PROGRESS NOTE  Douglas Rose.  PJK:[id number] DOB: 1945/02/10 DOA: 05/11/2016 PCP: [name], MD  Brief Narrative:   Douglas Rose. is a 71 y.o. male with history of multiple myeloma on chemotherapy, recent history of C. Diff colitis, who was placed on clindamycin for the last 3 weeks for mandibular abscess who presented to the ER with complaints of diffuse crampy abdominal pain with multiple episodes of diarrhea.  He was found to have sepsis due to C. Diff colitis.  His course has been complicated by ongoing sinus tachycardia and AKI.   Assessment & Plan:   Principal Problem:   SIRS (systemic inflammatory response syndrome) (HCC) Active Problems:   Coronary artery disease   Hyperlipidemia   Hypertension   Multiple myeloma (HCC)   Colitis   Type 2 diabetes mellitus with vascular disease ([place])   Sinus tachycardia   AKI (acute kidney injury) ([place])   Enteritis due to Clostridium difficile  Sepsis secondary to C. Diff colitis with improving leukocytosis, but persistent tachycardia and AKI.  Ongoing emesis with inability to tolerate more than a few sips of water and frequent diarrhea.   - continue IV flagyl - continue oral vancomycin day 3 - start IV dilaudid for pain control -  Continue zofran  Sinus tachycardia, likely related to sepsis and underresuscitation and holding beta blocker -  IVF  -  Doubt that this is due to adrenal insufficiency as BP are currently stable -  Resume metoprolol  Diabetes mellitus type 2 with hyperglycemia  -  D/c dextrose fluids as tolerating at least some PO -  Stopped steroids after initial dose of hydrocortisone was given at admission -  Continue levemir, add prandial coverage -  Continue moderate dose SSI  Acute on chronic kidney disease stage 3 due to MM and DM, likely due to dehydration and sepsis.  Creatinine up to 3.10, baseline of 1.5 -  Bladder scan:  Only a little more than 246m retained -  RUS: normal kidneys  -  FENa -   Repeat BMP in AM -  Not on ACE/ARB or NSAIDS -  Renal consult requested  Hypertension, blood pressures stable enough to resume metoprolol at this time  CAD s/p stenting in 2006, chest pain free -  Troponin negative -  Aspirin, statin, and beta blocker  Anemia and thrombocytopenia most likely from chemotherapy and MM - repeat CBC in AM  MM, followed by Dr. KIrene Limbo- see consult notes  Mandibular abscess, continue flagyl  Hypomagnesemia due to diarrhea -  IV magnesium repletion  Hyponatremia due to dehydration and hyperglycemia -  IVF and correct hyperglycemia  DVT prophylaxis: Lovenox. Code Status: Full code.  Family Communication: Patient alone Disposition Plan: TBD  Consultants:   nephrology  Procedures:  none  Antimicrobials:  Anti-infectives    Start     Dose/Rate Route Frequency Ordered Stop   05/12/16 1000  acyclovir (ZOVIRAX) 200 MG capsule 200 mg     200 mg Oral 2 times daily 05/12/16 0532     05/12/16 0900  vancomycin (VANCOCIN) 50 mg/mL oral solution 500 mg     500 mg Oral Every 6 hours 05/12/16 0748 05/26/16 0859   05/12/16 0600  metroNIDAZOLE (FLAGYL) IVPB 500 mg     500 mg 100 mL/hr over 60 Minutes Intravenous Every 8 hours 05/12/16 0532     05/12/16 0330  metroNIDAZOLE (FLAGYL) tablet 500 mg     500 mg Oral  Once 05/12/16 0327 05/12/16 08099  05/12/16 0330  ciprofloxacin (CIPRO) tablet 500 mg     500 mg Oral  Once 05/12/16 0327 05/12/16 5361      Subjective: Pt says he is a little better but overall has not been feeling well over last couple of days. Diarrhea seems to be slowing down.    Objective: Vitals:   05/13/16 0608 05/13/16 1344 05/13/16 2133 05/14/16 0541  BP:  119/82 131/78 137/82  Pulse:  (!) 107 (!) 104 (!) 114  Resp:  _0 Temp: 98.7 F (37.1 C) 97.6 F (36.4 C) 97.8 F (36.6 C) 97.5 F (36.4 C)  TempSrc: Oral Oral Oral Oral  SpO2:  100% 99% 98%  Weight:      Height:        Intake/Output Summary (Last 24 hours) at  05/14/16 1243 Last data filed at 05/14/16 0945  Gross per 24 hour  Intake          5226.67 ml  Output              200 ml  Net          5026.67 ml   Filed Weights   05/11/16 2353 05/12/16 0501  Weight: 76.2 kg (168 lb) 73.9 kg (163 lb)    Examination:  General exam:  Adult male.  No acute distress.  HEENT:  NCAT, dry tongue Respiratory system: Clear to auscultation bilaterally Cardiovascular system: tachycardic to 120s, normal S1/S2. No murmurs, rubs, gallops or clicks.  Warm extremities Gastrointestinal system: Hyperactive bowel sounds, soft, moderately distended and TTP diffusely without rebound or guarding MSK:  Normal tone and bulk, no lower extremity edema Neuro:  Grossly intact  Data Reviewed: I have personally reviewed following labs and imaging studies  CBC:  Recent Labs Lab 05/10/16 2258 05/12/16 0050 05/12/16 0533 05/13/16 0549 05/14/16 0724  WBC 6.0 12.3* 13.5* 11.1* 8.8  NEUTROABS  --  10.9* 11.8*  --   --   HGB 10.6* 11.0* 10.3* 10.5* 11.6*  HCT 31.2* 31.8* 30.2* 28.8* 32.4*  MCV 80.8 80.1 80.5 79.3 79.2  PLT 126* 117* 102* 98* 443*   Basic Metabolic Panel:  Recent Labs Lab 05/12/16 0050 05/12/16 0533 05/12/16 0715 05/13/16 0549 05/13/16 1719 05/14/16 0724  NA 134* 134*  --  129* 131* 133*  K 3.7 3.3*  --  3.9 3.8 3.7  CL 101 104  --  102 107 112*  CO2 23 17*  --  17* 17* 15*  GLUCOSE 148* 202*  --  494* 298* 158*  BUN 25* 27*  --  38* 39* 42*  CREATININE 1.89* 1.88*  --  2.91* 3.02* 3.19*  CALCIUM 8.4* 7.7*  --  7.6* 7.5* 7.4*  MG  --   --  1.6*  --   --   --    GFR: Estimated Creatinine Clearance: 21.9 mL/min (A) (by C-G formula based on SCr of 3.19 mg/dL (H)). Liver Function Tests:  Recent Labs Lab 05/10/16 2258 05/12/16 0050 05/12/16 0533  AST 19 15 14*  ALT 42 31 25  ALKPHOS 48 48 45  BILITOT 0.8 0.8 1.0  PROT 5.8* 5.0* 4.4*  ALBUMIN 2.8* 2.4* 2.1*    Recent Labs Lab 05/12/16 0050  LIPASE 16   No results for input(s):  AMMONIA in the last 168 hours. Coagulation Profile: No results for input(s): INR, PROTIME in the last 168 hours. Cardiac Enzymes:  Recent Labs Lab 05/12/16 0533 05/12/16 1133 05/12/16 1915  TROPONINI 0.07* 0.10* 0.06*  BNP (last 3 results) No results for input(s): PROBNP in the last 8760 hours. HbA1C: No results for input(s): HGBA1C in the last 72 hours. CBG:  Recent Labs Lab 05/13/16 2022 05/13/16 2356 05/14/16 0350 05/14/16 0813 05/14/16 1201  GLUCAP 327* 230* 170* 147* 135*   Lipid Profile: No results for input(s): CHOL, HDL, LDLCALC, TRIG, CHOLHDL, LDLDIRECT in the last 72 hours. Thyroid Function Tests: No results for input(s): TSH, T4TOTAL, FREET4, T3FREE, THYROIDAB in the last 72 hours. Anemia Panel: No results for input(s): VITAMINB12, FOLATE, FERRITIN, TIBC, IRON, RETICCTPCT in the last 72 hours. Urine analysis:    Component Value Date/Time   COLORURINE AMBER (A) 05/13/2016 1756   APPEARANCEUR HAZY (A) 05/13/2016 1756   LABSPEC 1.027 05/13/2016 1756   PHURINE 5.0 05/13/2016 1756   GLUCOSEU >=500 (A) 05/13/2016 1756   HGBUR SMALL (A) 05/13/2016 1756   BILIRUBINUR NEGATIVE 05/13/2016 1756   KETONESUR NEGATIVE 05/13/2016 1756   PROTEINUR 30 (A) 05/13/2016 1756   NITRITE NEGATIVE 05/13/2016 1756   LEUKOCYTESUR SMALL (A) 05/13/2016 1756    Recent Results (from the past 240 hour(s))  C difficile quick scan w PCR reflex     Status: Abnormal   Collection Time: 05/12/16  4:00 AM  Result Value Ref Range Status   C Diff antigen POSITIVE (A) NEGATIVE Final   C Diff toxin POSITIVE (A) NEGATIVE Final   C Diff interpretation Toxin producing C. difficile detected.  Final    Comment: RESULT CALLED TO, READ BACK BY AND VERIFIED WITH: KAYLA PRICE AT [place] ON May 12 2016 BY [facility]     Radiology Studies: US Renal  Result Date: 05/13/2016 CLINICAL DATA:  Acute kidney injury 05/08/2016. EXAM: RENAL / URINARY TRACT ULTRASOUND COMPLETE COMPARISON:  CT abdomen and  pelvis 05/12/2016. FINDINGS: Right Kidney: Length: 10.2 cm. Echogenicity within normal limits. No mass or hydronephrosis visualized. Left Kidney: Length: 10.3 cm. Echogenicity within normal limits. No mass or hydronephrosis visualized. Bladder: Appears normal for degree of bladder distention. Small bilateral pleural effusions and ascites are identified. IMPRESSION: Negative for hydronephrosis.  Normal appearing kidneys. Ascites and small bilateral pleural effusions Electronically Signed   By: Inge Rise M.D.   On: 05/13/2016 12:36   Scheduled Meds: . acyclovir  200 mg Oral BID  . aspirin EC  81 mg Oral Daily  . atorvastatin  20 mg Oral Daily  . enoxaparin (LOVENOX) injection  30 mg Subcutaneous Q24H  . feeding supplement (GLUCERNA SHAKE)  237 mL Oral TID BM  . insulin aspart  0-15 Units Subcutaneous Q4H  . insulin detemir  20 Units Subcutaneous QHS  . lactobacillus acidophilus  2 tablet Oral TID  . metoprolol succinate  75 mg Oral Daily  . multivitamin with minerals  1 tablet Oral Daily  . vancomycin  500 mg Oral Q6H   Continuous Infusions: . sodium chloride 150 mL/hr at 05/14/16 1012  . metronidazole Stopped (05/14/16 1324)    LOS: 2 days   Time spent: 30 min  Irwin Brakeman, MD [facility] Pager [phone]  If 7PM-7AM, please contact night-coverage [email or web address] Password [email or web address] 05/14/2016, 12:43 PM

## 2016-05-15 LAB — RENAL FUNCTION PANEL
ANION GAP: 10 (ref 5–15)
Albumin: 1.8 g/dL — ABNORMAL LOW (ref 3.5–5.0)
BUN: 50 mg/dL — ABNORMAL HIGH (ref 6–20)
CHLORIDE: 106 mmol/L (ref 101–111)
CO2: 16 mmol/L — ABNORMAL LOW (ref 22–32)
Calcium: 7.6 mg/dL — ABNORMAL LOW (ref 8.9–10.3)
Creatinine, Ser: 4.37 mg/dL — ABNORMAL HIGH (ref 0.61–1.24)
GFR calc non Af Amer: 12 mL/min — ABNORMAL LOW (ref >60)
GFR, EST AFRICAN AMERICAN: 14 mL/min — AB (ref >60)
GLUCOSE: 175 mg/dL — AB (ref 65–99)
Phosphorus: 5.8 mg/dL — ABNORMAL HIGH (ref 2.5–4.6)
Potassium: 4.2 mmol/L (ref 3.5–5.1)
SODIUM: 132 mmol/L — AB (ref 135–145)

## 2016-05-15 LAB — CBC
HCT: 33.6 % — ABNORMAL LOW (ref 39.0–52.0)
Hemoglobin: 12.1 g/dL — ABNORMAL LOW (ref 13.0–17.0)
MCH: 28.3 pg (ref 26.0–34.0)
MCHC: 36 g/dL (ref 30.0–36.0)
MCV: 78.7 fL (ref 78.0–100.0)
PLATELETS: 159 10*3/uL (ref 150–400)
RBC: 4.27 MIL/uL (ref 4.22–5.81)
RDW: 18.1 % — ABNORMAL HIGH (ref 11.5–15.5)
WBC: 8.9 10*3/uL (ref 4.0–10.5)

## 2016-05-15 LAB — GLUCOSE, CAPILLARY
GLUCOSE-CAPILLARY: 150 mg/dL — AB (ref 65–99)
GLUCOSE-CAPILLARY: 173 mg/dL — AB (ref 65–99)
Glucose-Capillary: 176 mg/dL — ABNORMAL HIGH (ref 65–99)
Glucose-Capillary: 201 mg/dL — ABNORMAL HIGH (ref 65–99)

## 2016-05-15 MED ORDER — INSULIN DETEMIR 100 UNIT/ML ~~LOC~~ SOLN
15.0000 [IU] | Freq: Every day | SUBCUTANEOUS | Status: DC
  Administered 2016-05-15 – 2016-05-21 (×7): 15 [IU] via SUBCUTANEOUS
  Filled 2016-05-15 (×8): qty 0.15

## 2016-05-15 MED ORDER — FUROSEMIDE 10 MG/ML IJ SOLN
160.0000 mg | Freq: Two times a day (BID) | INTRAVENOUS | Status: DC
  Administered 2016-05-15 – 2016-05-18 (×7): 160 mg via INTRAVENOUS
  Filled 2016-05-15: qty 16
  Filled 2016-05-15: qty 10
  Filled 2016-05-15: qty 4
  Filled 2016-05-15 (×4): qty 16

## 2016-05-15 MED ORDER — HYDROMORPHONE HCL 1 MG/ML IJ SOLN
0.5000 mg | INTRAMUSCULAR | Status: DC | PRN
  Administered 2016-05-15 – 2016-05-21 (×7): 0.5 mg via INTRAVENOUS
  Filled 2016-05-15 (×7): qty 1

## 2016-05-15 MED ORDER — INSULIN ASPART 100 UNIT/ML ~~LOC~~ SOLN
0.0000 [IU] | Freq: Three times a day (TID) | SUBCUTANEOUS | Status: DC
  Administered 2016-05-15: 1 [IU] via SUBCUTANEOUS
  Administered 2016-05-15 – 2016-05-16 (×2): 3 [IU] via SUBCUTANEOUS
  Administered 2016-05-16: 2 [IU] via SUBCUTANEOUS
  Administered 2016-05-16: 3 [IU] via SUBCUTANEOUS
  Administered 2016-05-17: 2 [IU] via SUBCUTANEOUS
  Administered 2016-05-17: 3 [IU] via SUBCUTANEOUS
  Administered 2016-05-17 – 2016-05-19 (×2): 2 [IU] via SUBCUTANEOUS
  Administered 2016-05-19 – 2016-05-20 (×4): 3 [IU] via SUBCUTANEOUS
  Administered 2016-05-21: 5 [IU] via SUBCUTANEOUS
  Administered 2016-05-21: 1 [IU] via SUBCUTANEOUS
  Administered 2016-05-22: 2 [IU] via SUBCUTANEOUS
  Administered 2016-05-22 (×2): 3 [IU] via SUBCUTANEOUS
  Administered 2016-05-23: 2 [IU] via SUBCUTANEOUS
  Administered 2016-05-24: 1 [IU] via SUBCUTANEOUS
  Administered 2016-05-25: 3 [IU] via SUBCUTANEOUS
  Administered 2016-05-25 (×2): 2 [IU] via SUBCUTANEOUS
  Administered 2016-05-27: 1 [IU] via SUBCUTANEOUS
  Administered 2016-05-27 (×2): 2 [IU] via SUBCUTANEOUS
  Administered 2016-05-28: 1 [IU] via SUBCUTANEOUS
  Administered 2016-05-28: 3 [IU] via SUBCUTANEOUS
  Administered 2016-05-28: 1 [IU] via SUBCUTANEOUS
  Administered 2016-05-29 – 2016-05-30 (×6): 2 [IU] via SUBCUTANEOUS
  Administered 2016-05-31: 3 [IU] via SUBCUTANEOUS
  Administered 2016-05-31: 1 [IU] via SUBCUTANEOUS
  Administered 2016-05-31: 3 [IU] via SUBCUTANEOUS

## 2016-05-15 MED ORDER — INSULIN ASPART 100 UNIT/ML ~~LOC~~ SOLN
4.0000 [IU] | Freq: Three times a day (TID) | SUBCUTANEOUS | Status: DC
  Administered 2016-05-15 – 2016-05-17 (×7): 4 [IU] via SUBCUTANEOUS

## 2016-05-15 MED ORDER — SODIUM BICARBONATE 650 MG PO TABS
650.0000 mg | ORAL_TABLET | Freq: Three times a day (TID) | ORAL | Status: DC
  Administered 2016-05-15 – 2016-05-21 (×18): 650 mg via ORAL
  Filled 2016-05-15 (×20): qty 1

## 2016-05-15 NOTE — Progress Notes (Signed)
Md notified pt bladder scan of 214ml and UO of 85ml post Lasix IV piggyback.

## 2016-05-15 NOTE — Progress Notes (Signed)
S: diarrhea less but still present.  No SOB.  Taking PO fluids O:BP 114/65 (BP Location: Right Arm)   Pulse (!) 109   Temp 97.5 F (36.4 C) (Oral)   Resp 12   Ht 5' 11" (1.803 m)   Wt 73.9 kg (163 lb)   SpO2 100%   BMI 22.73 kg/m   Intake/Output Summary (Last 24 hours) at 05/15/16 0744 Last data filed at 05/14/16 1851  Gross per 24 hour  Intake           402.08 ml  Output               75 ml  Net           327.08 ml   Weight change:  RTI:KKENI and alert CVS: RRR Resp:clear Abd:+ BS NT mild distention Ext:no edema NEURO:CNI Ox3 no asterixis   . acyclovir  200 mg Oral BID  . aspirin EC  81 mg Oral Daily  . atorvastatin  20 mg Oral Daily  . enoxaparin (LOVENOX) injection  30 mg Subcutaneous Q24H  . feeding supplement (GLUCERNA SHAKE)  237 mL Oral TID BM  . insulin aspart  0-15 Units Subcutaneous TID WC  . insulin aspart  5 Units Subcutaneous TID WC  . insulin detemir  20 Units Subcutaneous QHS  . lactobacillus acidophilus  2 tablet Oral TID  . metoprolol succinate  75 mg Oral Daily  . multivitamin with minerals  1 tablet Oral Daily  . vancomycin  500 mg Oral Q6H   US Renal  Result Date: 05/13/2016 CLINICAL DATA:  Acute kidney injury 05/08/2016. EXAM: RENAL / URINARY TRACT ULTRASOUND COMPLETE COMPARISON:  CT abdomen and pelvis 05/12/2016. FINDINGS: Right Kidney: Length: 10.2 cm. Echogenicity within normal limits. No mass or hydronephrosis visualized. Left Kidney: Length: 10.3 cm. Echogenicity within normal limits. No mass or hydronephrosis visualized. Bladder: Appears normal for degree of bladder distention. Small bilateral pleural effusions and ascites are identified. IMPRESSION: Negative for hydronephrosis.  Normal appearing kidneys. Ascites and small bilateral pleural effusions Electronically Signed   By: Inge Rise M.D.   On: 05/13/2016 12:36   BMET    Component Value Date/Time   NA 132 (L) 05/15/2016 0455   NA 138 05/06/2016 1302   K 4.2 05/15/2016 0455   K  4.0 05/06/2016 1302   CL 106 05/15/2016 0455   CO2 16 (L) 05/15/2016 0455   CO2 27 05/06/2016 1302   GLUCOSE 175 (H) 05/15/2016 0455   GLUCOSE 289 (H) 05/06/2016 1302   BUN 50 (H) 05/15/2016 0455   BUN 19.2 05/06/2016 1302   CREATININE 4.37 (H) 05/15/2016 0455   CREATININE 1.5 (H) 05/06/2016 1302   CALCIUM 7.6 (L) 05/15/2016 0455   CALCIUM 9.1 05/06/2016 1302   GFRNONAA 12 (L) 05/15/2016 0455   GFRAA 14 (L) 05/15/2016 0455   CBC    Component Value Date/Time   WBC 8.9 05/15/2016 0455   RBC 4.27 05/15/2016 0455   HGB 12.1 (L) 05/15/2016 0455   HGB 9.8 (L) 05/06/2016 1302   HCT 33.6 (L) 05/15/2016 0455   HCT 29.2 (L) 05/06/2016 1302   PLT 159 05/15/2016 0455   PLT 171 05/06/2016 1302   MCV 78.7 05/15/2016 0455   MCV 82.7 05/06/2016 1302   MCH 28.3 05/15/2016 0455   MCHC 36.0 05/15/2016 0455   RDW 18.1 (H) 05/15/2016 0455   RDW 16.9 (H) 05/06/2016 1302   LYMPHSABS 0.5 (L) 05/12/2016 0533   LYMPHSABS 0.3 (L) 05/06/2016 1302  MONOABS 1.2 (H) 05/12/2016 0533   MONOABS 0.1 05/06/2016 1302   EOSABS 0.0 05/12/2016 0533   EOSABS 0.0 05/06/2016 1302   BASOSABS 0.0 05/12/2016 0533   BASOSABS 0.0 05/06/2016 1302     Assessment: 1. Acute on CKD 3 sec contrast nephropathy.  Scr higher, UO not well recorded 2. Multiple Myeloma 3. HTN 4. DM 5. C diff colitis 6. Met acidosis  Plan: 1. Decrease IV fluids 2. PO bicarb 3. IV lasix to try to stimulate UO 4. Daily labs.  Discussed the role of HD if renal fx cont to worsen   Tiffinie Caillier T

## 2016-05-15 NOTE — Progress Notes (Signed)
Pt has Foley for acute urinary retention, post Lasix urinary output 50 cc. Bladder scan indicated 277 cc. Pt denied discomfort. Olevia Bowens, MD notified.

## 2016-05-15 NOTE — Progress Notes (Signed)
PROGRESS NOTE  Douglas Rose.  TZG:[id number] DOB: 1945-10-30 DOA: 05/11/2016 PCP: [name], MD  Brief Narrative:   Furious Chiarelli. is a 71 y.o. male with history of multiple myeloma on chemotherapy, recent history of C. Diff colitis, who was placed on clindamycin for the last 3 weeks for mandibular abscess who presented to the ER with complaints of diffuse crampy abdominal pain with multiple episodes of diarrhea.  He was found to have sepsis due to C. Diff colitis.  His course has been complicated by ongoing sinus tachycardia and AKI.   Assessment & Plan:   Principal Problem:   SIRS (systemic inflammatory response syndrome) (HCC) Active Problems:   Coronary artery disease   Hyperlipidemia   Hypertension   Multiple myeloma (HCC)   Colitis   Type 2 diabetes mellitus with vascular disease ([place])   Sinus tachycardia   AKI (acute kidney injury) ([place])   Enteritis due to Clostridium difficile  Sepsis secondary to C. Diff colitis with improving leukocytosis, but persistent tachycardia and AKI.  Ongoing emesis with inability to tolerate more than a few sips of water and frequent diarrhea.   - continue IV flagyl - continue oral vancomycin day 3 - start IV dilaudid for pain control -  Continue zofran  Sinus tachycardia, likely related to sepsis and underresuscitation and holding beta blocker -  IVF  -  Doubt that this is due to adrenal insufficiency as BP are currently stable -  Resumed home metoprolol with improvement  Diabetes mellitus type 2 with hyperglycemia  -  D/c dextrose fluids as tolerating at least some PO -  Stopped steroids after initial dose of hydrocortisone was given at admission -  Continue levemir, add prandial coverage - reduced doses due to worsening renal function -  Continue moderate dose SSI CBG (last 3)   Recent Labs  05/14/16 2119 05/15/16 0801 05/15/16 1212  GLUCAP 145* 176* 201*   Acute on chronic kidney disease stage 3 due to MM and DM,  likely due to dehydration and sepsis and contrast nephropathy.  Creatinine up to 4.37,  baseline of 1.5 -  RUS: normal kidneys  -  Following daily Renal function panel -  Not on ACE/ARB or NSAIDS -  Appreciate renal consult, discussed possibility of HD with patient if renal function continues to deteriorate  Hypertension, blood pressures well controlled.  Following closely.    CAD s/p stenting in 2006, chest pain free -  Troponin negative -  Aspirin, statin, and beta blocker  Anemia and thrombocytopenia most likely from chemotherapy and MM - repeat CBC in AM  MM, followed by [name] - see consult notes   Mandibular abscess, continue flagyl - Pt had been working on following up with an oral surgeon for definitive treatment.    Hypomagnesemia due to diarrhea -  IV magnesium repletion as needed, follow levels.   Hyponatremia due to dehydration and hyperglycemia -  Stable, continue bicarbonate infusion per nephrologist  DVT prophylaxis: Lovenox. Code Status: Full code.  Family Communication: wife and daughter telephone Disposition Plan: TBD  Consultants:   nephrology  Procedures:  none  Antimicrobials:  Anti-infectives    Start     Dose/Rate Route Frequency Ordered Stop   05/12/16 1000  acyclovir (ZOVIRAX) 200 MG capsule 200 mg     200 mg Oral 2 times daily 05/12/16 0532     05/12/16 0900  vancomycin (VANCOCIN) 50 mg/mL oral solution 500 mg     500 mg Oral Every  6 hours 05/12/16 0748 05/26/16 0859   05/12/16 0600  metroNIDAZOLE (FLAGYL) IVPB 500 mg     500 mg 100 mL/hr over 60 Minutes Intravenous Every 8 hours 05/12/16 0532     05/12/16 0330  metroNIDAZOLE (FLAGYL) tablet 500 mg     500 mg Oral  Once 05/12/16 0327 05/12/16 0338   05/12/16 0330  ciprofloxacin (CIPRO) tablet 500 mg     500 mg Oral  Once 05/12/16 0327 05/12/16 0338      Subjective: Pt sleepy but arousable, says diarrhea is slowing.  No CP, No SOB.  He is worried about his kidneys.    Objective: Vitals:   05/14/16 1352 05/14/16 2122 05/15/16 0617 05/15/16 0936  BP: 119/67 137/82 114/65 (!) 129/109  Pulse: (!) 111 (!) 112 (!) 109 (!) 109  Resp: 18  12   Temp: 97.8 F (36.6 C) 97.4 F (36.3 C) 97.5 F (36.4 C)   TempSrc:   Oral   SpO2: 100% 98% 100%   Weight:      Height:        Intake/Output Summary (Last 24 hours) at 05/15/16 1027 Last data filed at 05/14/16 1851  Gross per 24 hour  Intake           402.08 ml  Output               75 ml  Net           327.08 ml   Filed Weights   05/11/16 2353 05/12/16 0501  Weight: 76.2 kg (168 lb) 73.9 kg (163 lb)   Review of systems: As per history otherwise all reviewed negative  Examination:  General exam:  Adult male.  No acute distress.  HEENT:  NCAT, MMM Respiratory system: Clear to auscultation bilaterally Cardiovascular system: normal S1/S2. No murmurs, rubs, gallops or clicks.  Warm extremities Gastrointestinal system: Hyperactive bowel sounds, soft, nondistended, without rebound or guarding MSK:  Normal tone and bulk, no lower extremity edema Neuro:  Grossly intact  Data Reviewed: I have personally reviewed following labs and imaging studies  CBC:  Recent Labs Lab 05/12/16 0050 05/12/16 0533 05/13/16 0549 05/14/16 0724 05/15/16 0455  WBC 12.3* 13.5* 11.1* 8.8 8.9  NEUTROABS 10.9* 11.8*  --   --   --   HGB 11.0* 10.3* 10.5* 11.6* 12.1*  HCT 31.8* 30.2* 28.8* 32.4* 33.6*  MCV 80.1 80.5 79.3 79.2 78.7  PLT 117* 102* 98* 117* 338   Basic Metabolic Panel:  Recent Labs Lab 05/12/16 0533 05/12/16 0715 05/13/16 0549 05/13/16 1719 05/14/16 0724 05/15/16 0455  NA 134*  --  129* 131* 133* 132*  K 3.3*  --  3.9 3.8 3.7 4.2  CL 104  --  102 107 112* 106  CO2 17*  --  17* 17* 15* 16*  GLUCOSE 202*  --  494* 298* 158* 175*  BUN 27*  --  38* 39* 42* 50*  CREATININE 1.88*  --  2.91* 3.02* 3.19* 4.37*  CALCIUM 7.7*  --  7.6* 7.5* 7.4* 7.6*  MG  --  1.6*  --   --   --   --   PHOS  --   --    --   --   --  5.8*   GFR: Estimated Creatinine Clearance: 16 mL/min (A) (by C-G formula based on SCr of 4.37 mg/dL (H)). Liver Function Tests:  Recent Labs Lab 05/10/16 2258 05/12/16 0050 05/12/16 0533 05/15/16 0455  AST 19 15 14*  --  ALT 42 31 25  --   ALKPHOS 48 48 45  --   BILITOT 0.8 0.8 1.0  --   PROT 5.8* 5.0* 4.4*  --   ALBUMIN 2.8* 2.4* 2.1* 1.8*    Recent Labs Lab 05/12/16 0050  LIPASE 16   No results for input(s): AMMONIA in the last 168 hours. Coagulation Profile: No results for input(s): INR, PROTIME in the last 168 hours. Cardiac Enzymes:  Recent Labs Lab 05/12/16 0533 05/12/16 1133 05/12/16 1915  TROPONINI 0.07* 0.10* 0.06*   BNP (last 3 results) No results for input(s): PROBNP in the last 8760 hours. HbA1C: No results for input(s): HGBA1C in the last 72 hours. CBG:  Recent Labs Lab 05/14/16 0813 05/14/16 1201 05/14/16 1711 05/14/16 2119 05/15/16 0801  GLUCAP 147* 135* 146* 145* 176*   Lipid Profile: No results for input(s): CHOL, HDL, LDLCALC, TRIG, CHOLHDL, LDLDIRECT in the last 72 hours. Thyroid Function Tests: No results for input(s): TSH, T4TOTAL, FREET4, T3FREE, THYROIDAB in the last 72 hours. Anemia Panel: No results for input(s): VITAMINB12, FOLATE, FERRITIN, TIBC, IRON, RETICCTPCT in the last 72 hours. Urine analysis:    Component Value Date/Time   COLORURINE AMBER (A) 05/13/2016 1756   APPEARANCEUR HAZY (A) 05/13/2016 1756   LABSPEC 1.027 05/13/2016 1756   PHURINE 5.0 05/13/2016 1756   GLUCOSEU >=500 (A) 05/13/2016 1756   HGBUR SMALL (A) 05/13/2016 1756   BILIRUBINUR NEGATIVE 05/13/2016 1756   KETONESUR NEGATIVE 05/13/2016 1756   PROTEINUR 30 (A) 05/13/2016 1756   NITRITE NEGATIVE 05/13/2016 1756   LEUKOCYTESUR SMALL (A) 05/13/2016 1756    Recent Results (from the past 240 hour(s))  C difficile quick scan w PCR reflex     Status: Abnormal   Collection Time: 05/12/16  4:00 AM  Result Value Ref Range Status   C Diff  antigen POSITIVE (A) NEGATIVE Final   C Diff toxin POSITIVE (A) NEGATIVE Final   C Diff interpretation Toxin producing C. difficile detected.  Final    Comment: RESULT CALLED TO, READ BACK BY AND VERIFIED WITH: KAYLA PRICE AT [place] ON May 12 2016 BY [facility]     Radiology Studies: US Renal  Result Date: 05/13/2016 CLINICAL DATA:  Acute kidney injury 05/08/2016. EXAM: RENAL / URINARY TRACT ULTRASOUND COMPLETE COMPARISON:  CT abdomen and pelvis 05/12/2016. FINDINGS: Right Kidney: Length: 10.2 cm. Echogenicity within normal limits. No mass or hydronephrosis visualized. Left Kidney: Length: 10.3 cm. Echogenicity within normal limits. No mass or hydronephrosis visualized. Bladder: Appears normal for degree of bladder distention. Small bilateral pleural effusions and ascites are identified. IMPRESSION: Negative for hydronephrosis.  Normal appearing kidneys. Ascites and small bilateral pleural effusions Electronically Signed   By: Inge Rise M.D.   On: 05/13/2016 12:36   Scheduled Meds: . acyclovir  200 mg Oral BID  . aspirin EC  81 mg Oral Daily  . atorvastatin  20 mg Oral Daily  . enoxaparin (LOVENOX) injection  30 mg Subcutaneous Q24H  . feeding supplement (GLUCERNA SHAKE)  237 mL Oral TID BM  . insulin aspart  0-9 Units Subcutaneous TID WC  . insulin aspart  4 Units Subcutaneous TID WC  . insulin detemir  15 Units Subcutaneous QHS  . lactobacillus acidophilus  2 tablet Oral TID  . metoprolol succinate  75 mg Oral Daily  . multivitamin with minerals  1 tablet Oral Daily  . sodium bicarbonate  650 mg Oral TID  . vancomycin  500 mg Oral Q6H   Continuous Infusions: .  furosemide 160 mg (05/15/16 0931)  . metronidazole Stopped (05/15/16 0612)  .  sodium bicarbonate (isotonic) infusion in sterile water 50 mL/hr at 05/15/16 0930    LOS: 3 days   Time spent: 24 min  Irwin Brakeman, MD [facility] Pager [phone]  If 7PM-7AM, please contact  night-coverage [email or web address] Password [email or web address] 05/15/2016, 10:27 AM

## 2016-05-16 LAB — RENAL FUNCTION PANEL
Albumin: 1.8 g/dL — ABNORMAL LOW (ref 3.5–5.0)
Anion gap: 11 (ref 5–15)
BUN: 70 mg/dL — ABNORMAL HIGH (ref 6–20)
CALCIUM: 7.4 mg/dL — AB (ref 8.9–10.3)
CO2: 18 mmol/L — AB (ref 22–32)
CREATININE: 5.37 mg/dL — AB (ref 0.61–1.24)
Chloride: 103 mmol/L (ref 101–111)
GFR calc non Af Amer: 10 mL/min — ABNORMAL LOW (ref >60)
GFR, EST AFRICAN AMERICAN: 11 mL/min — AB (ref >60)
GLUCOSE: 166 mg/dL — AB (ref 65–99)
Phosphorus: 6.8 mg/dL — ABNORMAL HIGH (ref 2.5–4.6)
Potassium: 4.2 mmol/L (ref 3.5–5.1)
SODIUM: 132 mmol/L — AB (ref 135–145)

## 2016-05-16 LAB — CBC
HCT: 33.6 % — ABNORMAL LOW (ref 39.0–52.0)
HEMOGLOBIN: 11.7 g/dL — AB (ref 13.0–17.0)
MCH: 27.4 pg (ref 26.0–34.0)
MCHC: 34.8 g/dL (ref 30.0–36.0)
MCV: 78.7 fL (ref 78.0–100.0)
Platelets: 216 10*3/uL (ref 150–400)
RBC: 4.27 MIL/uL (ref 4.22–5.81)
RDW: 18.1 % — ABNORMAL HIGH (ref 11.5–15.5)
WBC: 11 10*3/uL — AB (ref 4.0–10.5)

## 2016-05-16 LAB — GLUCOSE, CAPILLARY
GLUCOSE-CAPILLARY: 213 mg/dL — AB (ref 65–99)
Glucose-Capillary: 194 mg/dL — ABNORMAL HIGH (ref 65–99)
Glucose-Capillary: 211 mg/dL — ABNORMAL HIGH (ref 65–99)
Glucose-Capillary: 170 mg/dL — ABNORMAL HIGH (ref 65–99)

## 2016-05-16 MED ORDER — PANTOPRAZOLE SODIUM 40 MG PO TBEC
40.0000 mg | DELAYED_RELEASE_TABLET | Freq: Every day | ORAL | Status: DC
  Administered 2016-05-16 – 2016-05-21 (×6): 40 mg via ORAL
  Filled 2016-05-16 (×6): qty 1

## 2016-05-16 MED ORDER — HYDROMORPHONE HCL 1 MG/ML IJ SOLN
0.5000 mg | INTRAMUSCULAR | Status: AC
  Administered 2016-05-16: 0.5 mg via INTRAVENOUS
  Filled 2016-05-16: qty 1

## 2016-05-16 NOTE — Progress Notes (Signed)
Pt wife called to get update on pt condition. She was informed that pt is resting at the time.

## 2016-05-16 NOTE — Progress Notes (Signed)
PT Cancellation Note  Patient Details Name: Douglas Rose. MRN: [id number] DOB: 20-Apr-1945   Cancelled Treatment:    Reason Eval/Treat Not Completed: Other (comment);Fatigue/lethargy limiting ability to participate.  Will check back as he is getting meds and does not want to have any more than one person in the room at a time.   Ramond Dial 05/16/2016, 9:40 AM   Mee Hives, PT MS Acute Rehab Dept. Number: [place] [facility] [place]

## 2016-05-16 NOTE — Progress Notes (Signed)
PROGRESS NOTE  Douglas Rose.  ZDG:[id number] DOB: November 16, 1945 DOA: 05/11/2016 PCP: [name], MD  Brief Narrative:   Douglas Rose. is a 71 y.o. male with history of multiple myeloma on chemotherapy, recent history of C. Diff colitis, who was placed on clindamycin for the last 3 weeks for mandibular abscess who presented to the ER with complaints of diffuse crampy abdominal pain with multiple episodes of diarrhea.  He was found to have sepsis due to C. Diff colitis.  His course has been complicated by ongoing sinus tachycardia and AKI.   Assessment & Plan:   Principal Problem:   SIRS (systemic inflammatory response syndrome) (HCC) Active Problems:   Coronary artery disease   Hyperlipidemia   Hypertension   Multiple myeloma (HCC)   Colitis   Type 2 diabetes mellitus with vascular disease ([place])   Sinus tachycardia   AKI (acute kidney injury) ([place])   Enteritis due to Clostridium difficile  Sepsis secondary to C. Diff colitis with improving leukocytosis, but persistent tachycardia and AKI.  Ongoing emesis with inability to tolerate more than a few sips of water and frequent diarrhea.   - continue IV flagyl - continue oral vancomycin day 3 - start IV dilaudid for pain control -  Continue zofran for nausea symptoms  Sinus tachycardia -  IVF discontinued by nephrology -  Doubt that this is due to adrenal insufficiency as BP are currently stable -  Resumed home metoprolol with improvement  Diabetes mellitus type 2 with hyperglycemia  -  D/c dextrose fluids as tolerating at least some PO -  Stopped steroids after initial dose of hydrocortisone was given at admission -  Continue levemir, add prandial coverage - reduced doses due to worsening renal function -  Continue moderate dose SSI CBG (last 3)   Recent Labs  05/15/16 1704 05/15/16 2041 05/16/16 0823  GLUCAP 150* 173* 194*   Acute on chronic kidney disease stage 3 due to MM and DM, likely due to dehydration  and sepsis and contrast nephropathy.  Creatinine up to 5.3,  baseline of 1.5 -  RUS: normal kidneys  -  Following daily Renal function panel -  Not on ACE/ARB or NSAIDS -  Appreciate renal consult, discussed possibility of HD with patient if renal function continues to deteriorate  Hypertension, blood pressures well controlled.  Following closely.    CAD s/p stenting in 2006, chest pain free -  Troponin negative -  Aspirin, statin, and beta blocker  Anemia and thrombocytopenia most likely from chemotherapy and MM - repeat CBC in AM  MM, followed by [name] - see consult notes   Mandibular abscess, continue flagyl - Pt had been working on following up with an oral surgeon for definitive treatment.    Hypomagnesemia due to diarrhea -  IV magnesium repletion as needed, follow levels.   Hyponatremia due to dehydration and hyperglycemia -  Stable, continue bicarbonate infusion per nephrologist  DVT prophylaxis: Lovenox. Code Status: Full code.  Family Communication: wife at bedside Disposition Plan: TBD  Consultants:   nephrology  Procedures:  none  Antimicrobials:  Anti-infectives    Start     Dose/Rate Route Frequency Ordered Stop   05/12/16 1000  acyclovir (ZOVIRAX) 200 MG capsule 200 mg     200 mg Oral 2 times daily 05/12/16 0532     05/12/16 0900  vancomycin (VANCOCIN) 50 mg/mL oral solution 500 mg     500 mg Oral Every 6 hours 05/12/16 0748 05/26/16 0859  05/12/16 0600  metroNIDAZOLE (FLAGYL) IVPB 500 mg     500 mg 100 mL/hr over 60 Minutes Intravenous Every 8 hours 05/12/16 0532     05/12/16 0330  metroNIDAZOLE (FLAGYL) tablet 500 mg     500 mg Oral  Once 05/12/16 0327 05/12/16 0338   05/12/16 0330  ciprofloxacin (CIPRO) tablet 500 mg     500 mg Oral  Once 05/12/16 0327 05/12/16 0338      Subjective: Pt says he does not feel well, he has poor appetite, nausea and sensitivity to smells. Denies CP and SOB.    Objective: Vitals:   05/15/16 2000 05/15/16  2043 05/16/16 0437 05/16/16 0931  BP: 118/76 135/75 120/77 111/77  Pulse: (!) 106 (!) 108 (!) 104 (!) 110  Resp:  18 18   Temp:  97.4 F (36.3 C) 97.9 F (36.6 C)   TempSrc:   Oral   SpO2:  97% 100% 98%  Weight:      Height:        Intake/Output Summary (Last 24 hours) at 05/16/16 1249 Last data filed at 05/16/16 0506  Gross per 24 hour  Intake             2701 ml  Output              200 ml  Net             2501 ml   Filed Weights   05/11/16 2353 05/12/16 0501  Weight: 76.2 kg (168 lb) 73.9 kg (163 lb)   Review of systems: As per history otherwise all reviewed negative  Examination:  General exam:  Adult male.  No acute distress.  HEENT:  NCAT, MMM Respiratory system: Clear to auscultation bilaterally Cardiovascular system: normal S1/S2. No murmurs, rubs, gallops or clicks.  Warm extremities Gastrointestinal system: active bowel sounds, soft, nondistended, without rebound or guarding MSK:  Normal tone and bulk, no lower extremity edema Neuro:  Grossly intact  Data Reviewed: I have personally reviewed following labs and imaging studies  CBC:  Recent Labs Lab 05/12/16 0050 05/12/16 0533 05/13/16 0549 05/14/16 0724 05/15/16 0455 05/16/16 0446  WBC 12.3* 13.5* 11.1* 8.8 8.9 11.0*  NEUTROABS 10.9* 11.8*  --   --   --   --   HGB 11.0* 10.3* 10.5* 11.6* 12.1* 11.7*  HCT 31.8* 30.2* 28.8* 32.4* 33.6* 33.6*  MCV 80.1 80.5 79.3 79.2 78.7 78.7  PLT 117* 102* 98* 117* 159 761   Basic Metabolic Panel:  Recent Labs Lab 05/12/16 0715 05/13/16 0549 05/13/16 1719 05/14/16 0724 05/15/16 0455 05/16/16 0446  NA  --  129* 131* 133* 132* 132*  K  --  3.9 3.8 3.7 4.2 4.2  CL  --  102 107 112* 106 103  CO2  --  17* 17* 15* 16* 18*  GLUCOSE  --  494* 298* 158* 175* 166*  BUN  --  38* 39* 42* 50* 70*  CREATININE  --  2.91* 3.02* 3.19* 4.37* 5.37*  CALCIUM  --  7.6* 7.5* 7.4* 7.6* 7.4*  MG 1.6*  --   --   --   --   --   PHOS  --   --   --   --  5.8* 6.8*    GFR: Estimated Creatinine Clearance: 13 mL/min (A) (by C-G formula based on SCr of 5.37 mg/dL (H)). Liver Function Tests:  Recent Labs Lab 05/10/16 2258 05/12/16 0050 05/12/16 0533 05/15/16 0455 05/16/16 0446  AST 19 15 14*  --   --  ALT 42 31 25  --   --   ALKPHOS 48 48 45  --   --   BILITOT 0.8 0.8 1.0  --   --   PROT 5.8* 5.0* 4.4*  --   --   ALBUMIN 2.8* 2.4* 2.1* 1.8* 1.8*    Recent Labs Lab 05/12/16 0050  LIPASE 16   No results for input(s): AMMONIA in the last 168 hours. Coagulation Profile: No results for input(s): INR, PROTIME in the last 168 hours. Cardiac Enzymes:  Recent Labs Lab 05/12/16 0533 05/12/16 1133 05/12/16 1915  TROPONINI 0.07* 0.10* 0.06*   BNP (last 3 results) No results for input(s): PROBNP in the last 8760 hours. HbA1C: No results for input(s): HGBA1C in the last 72 hours. CBG:  Recent Labs Lab 05/15/16 0801 05/15/16 1212 05/15/16 1704 05/15/16 2041 05/16/16 0823  GLUCAP 176* 201* 150* 173* 194*   Lipid Profile: No results for input(s): CHOL, HDL, LDLCALC, TRIG, CHOLHDL, LDLDIRECT in the last 72 hours. Thyroid Function Tests: No results for input(s): TSH, T4TOTAL, FREET4, T3FREE, THYROIDAB in the last 72 hours. Anemia Panel: No results for input(s): VITAMINB12, FOLATE, FERRITIN, TIBC, IRON, RETICCTPCT in the last 72 hours. Urine analysis:    Component Value Date/Time   COLORURINE AMBER (A) 05/13/2016 1756   APPEARANCEUR HAZY (A) 05/13/2016 1756   LABSPEC 1.027 05/13/2016 1756   PHURINE 5.0 05/13/2016 1756   GLUCOSEU >=500 (A) 05/13/2016 1756   HGBUR SMALL (A) 05/13/2016 1756   BILIRUBINUR NEGATIVE 05/13/2016 1756   KETONESUR NEGATIVE 05/13/2016 1756   PROTEINUR 30 (A) 05/13/2016 1756   NITRITE NEGATIVE 05/13/2016 1756   LEUKOCYTESUR SMALL (A) 05/13/2016 1756    Recent Results (from the past 240 hour(s))  C difficile quick scan w PCR reflex     Status: Abnormal   Collection Time: 05/12/16  4:00 AM  Result Value  Ref Range Status   C Diff antigen POSITIVE (A) NEGATIVE Final   C Diff toxin POSITIVE (A) NEGATIVE Final   C Diff interpretation Toxin producing C. difficile detected.  Final    Comment: RESULT CALLED TO, READ BACK BY AND VERIFIED WITH: KAYLA PRICE AT [place] ON May 12 2016 BY [facility]     Radiology Studies: No results found. Scheduled Meds: . acyclovir  200 mg Oral BID  . aspirin EC  81 mg Oral Daily  . atorvastatin  20 mg Oral Daily  . enoxaparin (LOVENOX) injection  30 mg Subcutaneous Q24H  . feeding supplement (GLUCERNA SHAKE)  237 mL Oral TID BM  . insulin aspart  0-9 Units Subcutaneous TID WC  . insulin aspart  4 Units Subcutaneous TID WC  . insulin detemir  15 Units Subcutaneous QHS  . lactobacillus acidophilus  2 tablet Oral TID  . metoprolol succinate  75 mg Oral Daily  . multivitamin with minerals  1 tablet Oral Daily  . sodium bicarbonate  650 mg Oral TID  . vancomycin  500 mg Oral Q6H   Continuous Infusions: . furosemide Stopped (05/16/16 1033)  . metronidazole 500 mg (05/16/16 0506)  .  sodium bicarbonate (isotonic) infusion in sterile water 50 mL/hr at 05/15/16 2319    LOS: 4 days   Time spent: 23 min  Irwin Brakeman, MD [facility] Pager [phone]  If 7PM-7AM, please contact night-coverage [email or web address] Password [facility] 05/16/2016, 12:49 PM

## 2016-05-16 NOTE — Care Management Important Message (Signed)
Important Message  Patient Details  Name: Douglas Rose. MRN: [id number] Date of Birth: 1945/12/04   Medicare Important Message Given:  Yes    Sharin Mons, RN 05/16/2016, 10:50 AM

## 2016-05-16 NOTE — Progress Notes (Signed)
Pt repositioned, slight pressure applied to bladder area, pt ambulated in room, no urinary output. Catheter tubing free of kinks. Pt resting. Olevia Bowens, MD notified.

## 2016-05-16 NOTE — Progress Notes (Signed)
PT Cancellation Note  Patient Details Name: Douglas Rose. MRN: [id number] DOB: 1945-03-26   Cancelled Treatment:    Reason Eval/Treat Not Completed: Other (comment).  Pt is asking to wait due to general exhaustion.  Try again tomorrow.   Ramond Dial 05/16/2016, 12:44 PM   Mee Hives, PT MS Acute Rehab Dept. Number: [place] and [place]

## 2016-05-17 ENCOUNTER — Inpatient Hospital Stay (HOSPITAL_COMMUNITY): Payer: Medicare HMO

## 2016-05-17 DIAGNOSIS — I10 Essential (primary) hypertension: Secondary | ICD-10-CM

## 2016-05-17 LAB — CBC
HCT: 31.9 % — ABNORMAL LOW (ref 39.0–52.0)
HEMOGLOBIN: 11.7 g/dL — AB (ref 13.0–17.0)
MCH: 28.6 pg (ref 26.0–34.0)
MCHC: 36.7 g/dL — ABNORMAL HIGH (ref 30.0–36.0)
MCV: 78 fL (ref 78.0–100.0)
Platelets: 238 10*3/uL (ref 150–400)
RBC: 4.09 MIL/uL — ABNORMAL LOW (ref 4.22–5.81)
RDW: 18.2 % — ABNORMAL HIGH (ref 11.5–15.5)
WBC: 14.2 10*3/uL — ABNORMAL HIGH (ref 4.0–10.5)

## 2016-05-17 LAB — RENAL FUNCTION PANEL
ALBUMIN: 1.8 g/dL — AB (ref 3.5–5.0)
ANION GAP: 11 (ref 5–15)
BUN: 81 mg/dL — ABNORMAL HIGH (ref 6–20)
CALCIUM: 7.2 mg/dL — AB (ref 8.9–10.3)
CO2: 19 mmol/L — AB (ref 22–32)
Chloride: 102 mmol/L (ref 101–111)
Creatinine, Ser: 5.68 mg/dL — ABNORMAL HIGH (ref 0.61–1.24)
GFR, EST AFRICAN AMERICAN: 10 mL/min — AB (ref >60)
GFR, EST NON AFRICAN AMERICAN: 9 mL/min — AB (ref >60)
Glucose, Bld: 154 mg/dL — ABNORMAL HIGH (ref 65–99)
PHOSPHORUS: 7.1 mg/dL — AB (ref 2.5–4.6)
Potassium: 3.8 mmol/L (ref 3.5–5.1)
SODIUM: 132 mmol/L — AB (ref 135–145)

## 2016-05-17 LAB — URINALYSIS, ROUTINE W REFLEX MICROSCOPIC
BACTERIA UA: NONE SEEN
Bilirubin Urine: NEGATIVE
GLUCOSE, UA: NEGATIVE mg/dL
KETONES UR: NEGATIVE mg/dL
NITRITE: NEGATIVE
PROTEIN: NEGATIVE mg/dL
Specific Gravity, Urine: 1.012 (ref 1.005–1.030)
Squamous Epithelial / LPF: NONE SEEN
pH: 5 (ref 5.0–8.0)

## 2016-05-17 LAB — GLUCOSE, CAPILLARY
Glucose-Capillary: 156 mg/dL — ABNORMAL HIGH (ref 65–99)
Glucose-Capillary: 249 mg/dL — ABNORMAL HIGH (ref 65–99)
Glucose-Capillary: 179 mg/dL — ABNORMAL HIGH (ref 65–99)

## 2016-05-17 NOTE — Progress Notes (Signed)
PT Cancellation Note  Patient Details Name: Douglas Rose. MRN: [id number] DOB: 12-27-1945   Cancelled Treatment:    Reason Eval/Treat Not Completed: Patient declined, no reason specified. Pt refusing to participate in PT evaluation at this time as he just transferred from chair to bed and reported that he was short of breath and tired. PT will continue to f/u with pt as available.   [name] 05/17/2016, 12:04 PM

## 2016-05-17 NOTE — Evaluation (Signed)
Physical Therapy Evaluation Patient Details Name: Douglas Rose. MRN: [id number] DOB: [birth year] Today's Date: 05/17/2016   History of Present Illness  Douglas Rose. is a 71 y.o. male with history of multiple myeloma on chemotherapy, recent history of C. Diff colitis, who was placed on clindamycin for the last 3 weeks for mandibular abscess who presented to the ER with complaints of diffuse crampy abdominal pain with multiple episodes of diarrhea.  He was found to have sepsis due to C. Diff colitis.  His course has been complicated by ongoing sinus tachycardia and AKI.   Clinical Impression  Pt admitted with/for sepsis due to C. Diff colitis.  Eval limited to exercise in the bed and rolling for pericare, but pt noticeably weak and will likely need moderate assist..  Pt currently limited functionally due to the problems listed. ( See problems list.)   Pt will benefit from PT to maximize function and safety in order to get ready for next venue listed below.     Follow Up Recommendations SNF;Other (comment) (unless pt gets going he will not have the function to go hom)    Equipment Recommendations  Other (comment) (TBA)    Recommendations for Other Services       Precautions / Restrictions Precautions Precautions: Fall      Mobility  Bed Mobility Overal bed mobility: Needs Assistance Bed Mobility: Rolling Rolling: Min assist         General bed mobility comments: rail and min assist initially then min guard once aroused from sleep.  Transfers                 General transfer comment: pt deferred all OOB  Ambulation/Gait                Stairs            Wheelchair Mobility    Modified Rankin (Stroke Patients Only)       Balance                                             Pertinent Vitals/Pain Pain Assessment: Faces Faces Pain Scale: Hurts little more Pain Location: bottom from cdiff Pain Descriptors / Indicators:  Burning Pain Intervention(s): Monitored during session    Home Living Family/patient expects to be discharged to:: Private residence Living Arrangements: Spouse/significant other;Children Available Help at Discharge: Family;Available 24 hours/day Type of Home: House Home Access: Stairs to enter Entrance Stairs-Rails: Right;Left   Home Layout: Multi-level Home Equipment: None      Prior Function Level of Independence: Independent         Comments: drove, ran errands     Hand Dominance        Extremity/Trunk Assessment   Upper Extremity Assessment Upper Extremity Assessment: Overall WFL for tasks assessed (bil weakness, but functional)    Lower Extremity Assessment Lower Extremity Assessment: Generalized weakness       Communication   Communication: No difficulties  Cognition Arousal/Alertness: Awake/alert;Lethargic Behavior During Therapy: WFL for tasks assessed/performed Overall Cognitive Status: Within Functional Limits for tasks assessed                                        General Comments      Exercises General Exercises - Lower Extremity  Straight Leg Raises: AROM;AAROM;Both;10 reps;Supine Hip Flexion/Marching: AROM;Strengthening;Both;10 reps (graded resistance) Other Exercises Other Exercises: bicep/tricep presses bil x 10 reps   Assessment/Plan    PT Assessment Patient needs continued PT services  PT Problem List Decreased strength;Decreased activity tolerance;Decreased mobility;Decreased knowledge of use of DME;Decreased coordination       PT Treatment Interventions Gait training;DME instruction;Functional mobility training;Therapeutic activities;Therapeutic exercise;Balance training;Patient/family education    PT Goals (Current goals can be found in the Care Plan section)  Acute Rehab PT Goals Patient Stated Goal: get back home PT Goal Formulation: With patient Time For Goal Achievement: 05/31/16 Potential to Achieve  Goals: Fair    Frequency Min 3X/week   Barriers to discharge        Co-evaluation               AM-PAC PT "6 Clicks" Daily Activity  Outcome Measure Difficulty turning over in bed (including adjusting bedclothes, sheets and blankets)?: Total Difficulty moving from lying on back to sitting on the side of the bed? : Total Difficulty sitting down on and standing up from a chair with arms (e.g., wheelchair, bedside commode, etc,.)?: Total Help needed moving to and from a bed to chair (including a wheelchair)?: A Lot Help needed walking in hospital room?: A Lot Help needed climbing 3-5 steps with a railing? : A Lot 6 Click Score: 9    End of Session   Activity Tolerance: Patient limited by fatigue;Patient limited by lethargy Patient left: in bed;with call bell/phone within reach;with bed alarm set Nurse Communication: Mobility status PT Visit Diagnosis: Muscle weakness (generalized) (M62.81)    Time: 5248-1859 PT Time Calculation (min) (ACUTE ONLY): 26 min   Charges:   PT Evaluation $PT Eval Moderate Complexity: 1 Procedure PT Treatments $Therapeutic Exercise: 8-22 mins   PT G Codes:        05-25-2016  Donnella Sham, PT [phone]  (pager)  Tessie Fass Jaimie Redditt 2016-05-25, 4:37 PM

## 2016-05-17 NOTE — Progress Notes (Signed)
S: Sitting up in chair.  Still with diarrhea.  Appetite poor but taking po fluids O:BP 135/66 (BP Location: Left Arm)   Pulse (!) 109   Temp 97.5 F (36.4 C) (Oral)   Resp 18   Ht _0  (1.803 m)   Wt 73.9 kg (163 lb)   SpO2 100%   BMI 22.73 kg/m   Intake/Output Summary (Last 24 hours) at 05/17/16 1041 Last data filed at 05/17/16 0618  Gross per 24 hour  Intake              120 ml  Output              300 ml  Net             -180 ml   Weight change:  QJF:HLKTG and alert CVS: RRR Resp:clear Abd:+ BS NT mild distention Ext: 1+ edema NEURO:CNI Ox3 no asterixis   . acyclovir  200 mg Oral BID  . aspirin EC  81 mg Oral Daily  . atorvastatin  20 mg Oral Daily  . enoxaparin (LOVENOX) injection  30 mg Subcutaneous Q24H  . feeding supplement (GLUCERNA SHAKE)  237 mL Oral TID BM  . insulin aspart  0-9 Units Subcutaneous TID WC  . insulin aspart  4 Units Subcutaneous TID WC  . insulin detemir  15 Units Subcutaneous QHS  . lactobacillus acidophilus  2 tablet Oral TID  . metoprolol succinate  75 mg Oral Daily  . multivitamin with minerals  1 tablet Oral Daily  . pantoprazole  40 mg Oral Daily  . sodium bicarbonate  650 mg Oral TID  . vancomycin  500 mg Oral Q6H   No results found. BMET    Component Value Date/Time   NA 132 (L) 05/17/2016 0816   NA 138 05/06/2016 1302   K 3.8 05/17/2016 0816   K 4.0 05/06/2016 1302   CL 102 05/17/2016 0816   CO2 19 (L) 05/17/2016 0816   CO2 27 05/06/2016 1302   GLUCOSE 154 (H) 05/17/2016 0816   GLUCOSE 289 (H) 05/06/2016 1302   BUN 81 (H) 05/17/2016 0816   BUN 19.2 05/06/2016 1302   CREATININE 5.68 (H) 05/17/2016 0816   CREATININE 1.5 (H) 05/06/2016 1302   CALCIUM 7.2 (L) 05/17/2016 0816   CALCIUM 9.1 05/06/2016 1302   GFRNONAA 9 (L) 05/17/2016 0816   GFRAA 10 (L) 05/17/2016 0816   CBC    Component Value Date/Time   WBC 14.2 (H) 05/17/2016 0816   RBC 4.09 (L) 05/17/2016 0816   HGB 11.7 (L) 05/17/2016 0816   HGB 9.8 (L)  05/06/2016 1302   HCT 31.9 (L) 05/17/2016 0816   HCT 29.2 (L) 05/06/2016 1302   PLT 238 05/17/2016 0816   PLT 171 05/06/2016 1302   MCV 78.0 05/17/2016 0816   MCV 82.7 05/06/2016 1302   MCH 28.6 05/17/2016 0816   MCHC 36.7 (H) 05/17/2016 0816   RDW 18.2 (H) 05/17/2016 0816   RDW 16.9 (H) 05/06/2016 1302   LYMPHSABS 0.5 (L) 05/12/2016 0533   LYMPHSABS 0.3 (L) 05/06/2016 1302   MONOABS 1.2 (H) 05/12/2016 0533   MONOABS 0.1 05/06/2016 1302   EOSABS 0.0 05/12/2016 0533   EOSABS 0.0 05/06/2016 1302   BASOSABS 0.0 05/12/2016 0533   BASOSABS 0.0 05/06/2016 1302     Assessment: 1. Acute on CKD 3 sec contrast nephropathy.  Scr higher, UO yest not much though UO appears to be increasing based on the urine in his foley 2. Multiple Myeloma  3. HTN 4. DM 5. C diff colitis 6. Met acidosis on oral and IV bicarb  Plan: 1. DC IV fluids 2. Cont lasix 3. Daily labs.  He is aware of potential need for HD if renal fx does not improve soon.  I would like to hold off as long as possible as I expect this to get better    Douglas Rose

## 2016-05-17 NOTE — Progress Notes (Signed)
PROGRESS NOTE  [name].  JJO:[id number] DOB: 11/28/1945 DOA: 05/11/2016 PCP: [name], MD  Brief Narrative:   Culver Feighner. is a 71 y.o. male with history of multiple myeloma on chemotherapy, recent history of C. Diff colitis, who was placed on clindamycin for the last 3 weeks for mandibular abscess who presented to the ER with complaints of diffuse crampy abdominal pain with multiple episodes of diarrhea.  He was found to have sepsis due to C. Diff colitis.  His course has been complicated by ongoing sinus tachycardia and AKI.   Assessment & Plan:   Principal Problem:   SIRS (systemic inflammatory response syndrome) (HCC) Active Problems:   Coronary artery disease   Hyperlipidemia   Hypertension   Multiple myeloma (HCC)   Colitis   Type 2 diabetes mellitus with vascular disease ([place])   Sinus tachycardia   AKI (acute kidney injury) ([place])   Enteritis due to Clostridium difficile  Sepsis secondary to C. Diff colitis with improving leukocytosis, but persistent tachycardia and AKI.  Ongoing emesis with inability to tolerate more than a few sips of water and frequent diarrhea.   - continue IV flagyl - continue oral vancomycin day 3 - start IV dilaudid for pain control -  Continue zofran for nausea symptoms  Sinus tachycardia -  IVF discontinued by nephrology -  Doubt that this is due to adrenal insufficiency as BP are currently stable -  Resumed home metoprolol with improvement  Diabetes mellitus type 2 with hyperglycemia  -  D/c dextrose fluids as tolerating at least some PO -  Stopped steroids after initial dose of hydrocortisone was given at admission -  Continue levemir, add prandial coverage - reduced doses due to worsening renal function -  Continue moderate dose SSI CBG (last 3)   Recent Labs  05/16/16 2153 05/17/16 0742 05/17/16 1206  GLUCAP 170* 156* 249*   Acute on chronic kidney disease stage 3 due to MM and DM, likely due to dehydration  and sepsis and contrast nephropathy.  Creatinine up to 5.3,  baseline of 1.5 -  RUS: normal kidneys  -  Following daily Renal function panel -  Not on ACE/ARB or NSAIDS -  Appreciate renal consult, discussed possibility of HD with patient if renal function continues to deteriorate but hopeful of recovery  Hypertension, blood pressures well controlled.  Following closely.    CAD s/p stenting in 2006, chest pain free -  Troponin negative -  Aspirin, statin, and beta blocker  Anemia and thrombocytopenia most likely from chemotherapy and MM - repeat CBC in AM  Leukocytosis - ? From c diff infection, check portable CXR, urinalysis   MM, followed by [name] - see consult notes   Mandibular abscess, continue flagyl - Pt had been working on following up with an oral surgeon for definitive treatment.    Hypomagnesemia due to diarrhea -  IV magnesium repletion as needed, follow levels.   Hyponatremia due to dehydration and hyperglycemia -  Stable, continue bicarbonate infusion per nephrologist  DVT prophylaxis: Lovenox. Code Status: Full code.  Family Communication: wife at bedside Disposition Plan: TBD  Consultants:   nephrology  Procedures:  none  Antimicrobials:  Anti-infectives    Start     Dose/Rate Route Frequency Ordered Stop   05/12/16 1000  acyclovir (ZOVIRAX) 200 MG capsule 200 mg     200 mg Oral 2 times daily 05/12/16 0532     05/12/16 0900  vancomycin (VANCOCIN) 50 mg/mL oral  solution 500 mg     500 mg Oral Every 6 hours 05/12/16 0748 05/26/16 0859   05/12/16 0600  metroNIDAZOLE (FLAGYL) IVPB 500 mg     500 mg 100 mL/hr over 60 Minutes Intravenous Every 8 hours 05/12/16 0532     05/12/16 0330  metroNIDAZOLE (FLAGYL) tablet 500 mg     500 mg Oral  Once 05/12/16 0327 05/12/16 0338   05/12/16 0330  ciprofloxacin (CIPRO) tablet 500 mg     500 mg Oral  Once 05/12/16 0327 05/12/16 0338      Subjective: Pt says he does not have appetite. Denies CP and SOB.     Objective: Vitals:   05/16/16 0931 05/16/16 1605 05/16/16 2155 05/17/16 0601  BP: 111/77 118/74 120/72 135/66  Pulse: (!) 110 (!) 104 (!) 113 (!) 109  Resp:  20 18 18   Temp:  97.4 F (36.3 C)  97.5 F (36.4 C)  TempSrc:    Oral  SpO2: 98% 95% 97% 100%  Weight:      Height:        Intake/Output Summary (Last 24 hours) at 05/17/16 1324 Last data filed at 05/17/16 1700  Gross per 24 hour  Intake              120 ml  Output              300 ml  Net             -180 ml   Filed Weights   05/11/16 2353 05/12/16 0501  Weight: 76.2 kg (168 lb) 73.9 kg (163 lb)   Review of systems: As per history otherwise all reviewed negative  Examination:  General exam:  Adult male.  No acute distress.  HEENT:  NCAT, MMM Respiratory system: Clear to auscultation bilaterally Cardiovascular system: normal S1/S2. No murmurs, rubs, gallops or clicks.  Warm extremities Gastrointestinal system: active bowel sounds, soft, nondistended, without rebound or guarding MSK:  Normal tone and bulk, no lower extremity edema Neuro:  Grossly intact  Data Reviewed: I have personally reviewed following labs and imaging studies  CBC:  Recent Labs Lab 05/12/16 0050 05/12/16 0533 05/13/16 0549 05/14/16 0724 05/15/16 0455 05/16/16 0446 05/17/16 0816  WBC 12.3* 13.5* 11.1* 8.8 8.9 11.0* 14.2*  NEUTROABS 10.9* 11.8*  --   --   --   --   --   HGB 11.0* 10.3* 10.5* 11.6* 12.1* 11.7* 11.7*  HCT 31.8* 30.2* 28.8* 32.4* 33.6* 33.6* 31.9*  MCV 80.1 80.5 79.3 79.2 78.7 78.7 78.0  PLT 117* 102* 98* 117* 159 216 174   Basic Metabolic Panel:  Recent Labs Lab 05/12/16 0715  05/13/16 1719 05/14/16 0724 05/15/16 0455 05/16/16 0446 05/17/16 0816  NA  --   < > 131* 133* 132* 132* 132*  K  --   < > 3.8 3.7 4.2 4.2 3.8  CL  --   < > 107 112* 106 103 102  CO2  --   < > 17* 15* 16* 18* 19*  GLUCOSE  --   < > 298* 158* 175* 166* 154*  BUN  --   < > 39* 42* 50* 70* 81*  CREATININE  --   < > 3.02* 3.19* 4.37*  5.37* 5.68*  CALCIUM  --   < > 7.5* 7.4* 7.6* 7.4* 7.2*  MG 1.6*  --   --   --   --   --   --   PHOS  --   --   --   --  5.8* 6.8* 7.1*  < > = values in this interval not displayed. GFR: Estimated Creatinine Clearance: 12.3 mL/min (A) (by C-G formula based on SCr of 5.68 mg/dL (H)). Liver Function Tests:  Recent Labs Lab 05/10/16 2258 05/12/16 0050 05/12/16 0533 05/15/16 0455 05/16/16 0446 05/17/16 0816  AST 19 15 14*  --   --   --   ALT 42 31 25  --   --   --   ALKPHOS 48 48 45  --   --   --   BILITOT 0.8 0.8 1.0  --   --   --   PROT 5.8* 5.0* 4.4*  --   --   --   ALBUMIN 2.8* 2.4* 2.1* 1.8* 1.8* 1.8*    Recent Labs Lab 05/12/16 0050  LIPASE 16   No results for input(s): AMMONIA in the last 168 hours. Coagulation Profile: No results for input(s): INR, PROTIME in the last 168 hours. Cardiac Enzymes:  Recent Labs Lab 05/12/16 0533 05/12/16 1133 05/12/16 1915  TROPONINI 0.07* 0.10* 0.06*   BNP (last 3 results) No results for input(s): PROBNP in the last 8760 hours. HbA1C: No results for input(s): HGBA1C in the last 72 hours. CBG:  Recent Labs Lab 05/16/16 1253 05/16/16 1727 05/16/16 2153 05/17/16 0742 05/17/16 1206  GLUCAP 213* 211* 170* 156* 249*   Lipid Profile: No results for input(s): CHOL, HDL, LDLCALC, TRIG, CHOLHDL, LDLDIRECT in the last 72 hours. Thyroid Function Tests: No results for input(s): TSH, T4TOTAL, FREET4, T3FREE, THYROIDAB in the last 72 hours. Anemia Panel: No results for input(s): VITAMINB12, FOLATE, FERRITIN, TIBC, IRON, RETICCTPCT in the last 72 hours. Urine analysis:    Component Value Date/Time   COLORURINE AMBER (A) 05/13/2016 1756   APPEARANCEUR HAZY (A) 05/13/2016 1756   LABSPEC 1.027 05/13/2016 1756   PHURINE 5.0 05/13/2016 1756   GLUCOSEU >=500 (A) 05/13/2016 1756   HGBUR SMALL (A) 05/13/2016 1756   BILIRUBINUR NEGATIVE 05/13/2016 1756   KETONESUR NEGATIVE 05/13/2016 1756   PROTEINUR 30 (A) 05/13/2016 1756    NITRITE NEGATIVE 05/13/2016 1756   LEUKOCYTESUR SMALL (A) 05/13/2016 1756    Recent Results (from the past 240 hour(s))  C difficile quick scan w PCR reflex     Status: Abnormal   Collection Time: 05/12/16  4:00 AM  Result Value Ref Range Status   C Diff antigen POSITIVE (A) NEGATIVE Final   C Diff toxin POSITIVE (A) NEGATIVE Final   C Diff interpretation Toxin producing C. difficile detected.  Final    Comment: RESULT CALLED TO, READ BACK BY AND VERIFIED WITH: KAYLA PRICE AT [place] ON May 12 2016 BY [facility]     Radiology Studies: No results found. Scheduled Meds: . acyclovir  200 mg Oral BID  . aspirin EC  81 mg Oral Daily  . atorvastatin  20 mg Oral Daily  . enoxaparin (LOVENOX) injection  30 mg Subcutaneous Q24H  . feeding supplement (GLUCERNA SHAKE)  237 mL Oral TID BM  . insulin aspart  0-9 Units Subcutaneous TID WC  . insulin aspart  4 Units Subcutaneous TID WC  . insulin detemir  15 Units Subcutaneous QHS  . lactobacillus acidophilus  2 tablet Oral TID  . metoprolol succinate  75 mg Oral Daily  . multivitamin with minerals  1 tablet Oral Daily  . pantoprazole  40 mg Oral Daily  . sodium bicarbonate  650 mg Oral TID  . vancomycin  500 mg Oral Q6H   Continuous Infusions: . furosemide  Stopped (05/17/16 9641)  . metronidazole Stopped (05/17/16 0655)    LOS: 5 days   Time spent: 21 min  Irwin Brakeman, MD [facility] Pager [phone]  If 7PM-7AM, please contact night-coverage [email or web address] Password [email or web address] 05/17/2016, 1:24 PM

## 2016-05-18 LAB — CBC
HCT: 31.9 % — ABNORMAL LOW (ref 39.0–52.0)
Hemoglobin: 11.5 g/dL — ABNORMAL LOW (ref 13.0–17.0)
MCH: 28.2 pg (ref 26.0–34.0)
MCHC: 36.1 g/dL — AB (ref 30.0–36.0)
MCV: 78.2 fL (ref 78.0–100.0)
PLATELETS: 295 10*3/uL (ref 150–400)
RBC: 4.08 MIL/uL — ABNORMAL LOW (ref 4.22–5.81)
RDW: 18.6 % — AB (ref 11.5–15.5)
WBC: 14.1 10*3/uL — ABNORMAL HIGH (ref 4.0–10.5)

## 2016-05-18 LAB — RENAL FUNCTION PANEL
Albumin: 1.7 g/dL — ABNORMAL LOW (ref 3.5–5.0)
Anion gap: 14 (ref 5–15)
BUN: 89 mg/dL — AB (ref 6–20)
CO2: 18 mmol/L — AB (ref 22–32)
CREATININE: 6.68 mg/dL — AB (ref 0.61–1.24)
Calcium: 7.2 mg/dL — ABNORMAL LOW (ref 8.9–10.3)
Chloride: 101 mmol/L (ref 101–111)
GFR calc Af Amer: 9 mL/min — ABNORMAL LOW (ref >60)
GFR calc non Af Amer: 7 mL/min — ABNORMAL LOW (ref >60)
GLUCOSE: 119 mg/dL — AB (ref 65–99)
Phosphorus: 7.5 mg/dL — ABNORMAL HIGH (ref 2.5–4.6)
Potassium: 3.9 mmol/L (ref 3.5–5.1)
Sodium: 133 mmol/L — ABNORMAL LOW (ref 135–145)

## 2016-05-18 LAB — GLUCOSE, CAPILLARY
GLUCOSE-CAPILLARY: 148 mg/dL — AB (ref 65–99)
Glucose-Capillary: 143 mg/dL — ABNORMAL HIGH (ref 65–99)
Glucose-Capillary: 124 mg/dL — ABNORMAL HIGH (ref 65–99)
Glucose-Capillary: 159 mg/dL — ABNORMAL HIGH (ref 65–99)
Glucose-Capillary: 161 mg/dL — ABNORMAL HIGH (ref 65–99)

## 2016-05-18 MED ORDER — FUROSEMIDE 10 MG/ML IJ SOLN
160.0000 mg | Freq: Three times a day (TID) | INTRAVENOUS | Status: DC
  Administered 2016-05-18 – 2016-05-20 (×5): 160 mg via INTRAVENOUS
  Filled 2016-05-18 (×6): qty 16

## 2016-05-18 NOTE — Progress Notes (Signed)
PROGRESS NOTE  Douglas Rose.  SVX:[id number] DOB: 1945-10-05 DOA: 05/11/2016 PCP: [name], MD  Brief Narrative:   Douglas Rose. is a 71 y.o. male with history of multiple myeloma on chemotherapy, recent history of C. Diff colitis, who was placed on clindamycin for the last 3 weeks for mandibular abscess who presented to the ER with complaints of diffuse crampy abdominal pain with multiple episodes of diarrhea.  He was found to have sepsis due to C. Diff colitis.  His course has been complicated by ongoing sinus tachycardia and AKI.   Assessment & Plan:   Principal Problem:   SIRS (systemic inflammatory response syndrome) (HCC) Active Problems:   Coronary artery disease   Hyperlipidemia   Hypertension   Multiple myeloma (HCC)   Colitis   Type 2 diabetes mellitus with vascular disease ([place])   Sinus tachycardia   AKI (acute kidney injury) ([place])   Enteritis due to Clostridium difficile  Sepsis secondary to C. Diff colitis with improving leukocytosis, but persistent tachycardia and AKI.  Ongoing emesis with inability to tolerate more than a few sips of water and frequent diarrhea.   - continue IV flagyl - continue oral vancomycin  -  IV dilaudid for pain control as needed -  Continue zofran for nausea symptoms  Sinus tachycardia -  IVF discontinued by nephrology -  Doubt that this is due to adrenal insufficiency as BP are currently stable -  Resumed home metoprolol with improvement  Diabetes mellitus type 2 with hyperglycemia  -  D/c dextrose fluids as tolerating at least some PO -  Stopped steroids after initial dose of hydrocortisone was given at admission -  Continue levemir, add prandial coverage - reduced doses due to worsening renal function -  Continue moderate dose SSI CBG (last 3)   Recent Labs  05/17/16 2125 05/18/16 0758 05/18/16 1146  GLUCAP 143* 124* 148*   Acute on chronic kidney disease stage 3 due to MM and DM, likely due to dehydration  and sepsis and contrast nephropathy.  Creatinine up to 5.3,  baseline of 1.5 -  RUS: normal kidneys  -  Following daily Renal function panel -  Not on ACE/ARB or NSAIDS -  Appreciate renal consult, discussed possibility of HD with patient if renal function continues to deteriorate but hopeful of recovery -  Pt with more symptoms of uremia, likely will need HD in next 24 hours.  Following closely.    Hypertension, blood pressures well controlled.  Following closely.    Metabolic acidosis - started on sodium bicarbonate tablets by nephrology  CAD s/p stenting in 2006, chest pain free -  Troponin negative -  Aspirin, statin, and beta blocker  Anemia and thrombocytopenia most likely from chemotherapy and MM - repeat CBC in AM  Leukocytosis - ? From c diff infection, portable CXR nonspecific for pneumonia, patient too weak to go for 2 view CXR, no fever, no cough, no SOB. Following closely.     MM, followed by [name] - see consult notes   Mandibular abscess, continue flagyl - Pt had been working on following up with an oral surgeon for definitive treatment.    Hypomagnesemia due to diarrhea -  IV magnesium repletion as needed, follow levels.   Hyponatremia due to dehydration and hyperglycemia -  Stable, completed bicarbonate infusion per nephrologist  DVT prophylaxis: Lovenox. Code Status: Full code.  Family Communication: wife at bedside Disposition Plan: TBD  Consultants:   nephrology  Procedures:  none  Antimicrobials:  Anti-infectives    Start     Dose/Rate Route Frequency Ordered Stop   05/12/16 1000  acyclovir (ZOVIRAX) 200 MG capsule 200 mg     200 mg Oral 2 times daily 05/12/16 0532     05/12/16 0900  vancomycin (VANCOCIN) 50 mg/mL oral solution 500 mg     500 mg Oral Every 6 hours 05/12/16 0748 05/26/16 0859   05/12/16 0600  metroNIDAZOLE (FLAGYL) IVPB 500 mg     500 mg 100 mL/hr over 60 Minutes Intravenous Every 8 hours 05/12/16 0532     05/12/16 0330   metroNIDAZOLE (FLAGYL) tablet 500 mg     500 mg Oral  Once 05/12/16 0327 05/12/16 0338   05/12/16 0330  ciprofloxacin (CIPRO) tablet 500 mg     500 mg Oral  Once 05/12/16 0327 05/12/16 0338      Subjective: Pt says he does not have appetite. Denies CP and SOB.    Objective: Vitals:   05/16/16 2155 05/17/16 0601 05/17/16 2129 05/18/16 0526  BP: 120/72 135/66 129/69 117/66  Pulse: (!) 113 (!) 109 (!) 108 (!) 104  Resp: 18 18 18 18   Temp:  97.5 F (36.4 C) 97.4 F (36.3 C) 97.7 F (36.5 C)  TempSrc:  Oral Oral Oral  SpO2: 97% 100% 100% 100%  Weight:      Height:        Intake/Output Summary (Last 24 hours) at 05/18/16 1245 Last data filed at 05/18/16 1200  Gross per 24 hour  Intake                0 ml  Output              650 ml  Net             -650 ml   Filed Weights   05/11/16 2353 05/12/16 0501  Weight: 76.2 kg (168 lb) 73.9 kg (163 lb)   Review of systems: As per history otherwise all reviewed negative  Examination:  General exam:  Adult male.  No acute distress.  HEENT:  NCAT, MMM Respiratory system: Clear to auscultation bilaterally Cardiovascular system: normal S1/S2. No murmurs, rubs, gallops or clicks.  Warm extremities Gastrointestinal system: active bowel sounds, soft, nondistended, without rebound or guarding MSK:  Normal tone and bulk, no lower extremity edema Neuro:  Grossly intact  Data Reviewed: I have personally reviewed following labs and imaging studies  CBC:  Recent Labs Lab 05/12/16 0050 05/12/16 0533  05/14/16 0724 05/15/16 0455 05/16/16 0446 05/17/16 0816 05/18/16 0824  WBC 12.3* 13.5*  < > 8.8 8.9 11.0* 14.2* 14.1*  NEUTROABS 10.9* 11.8*  --   --   --   --   --   --   HGB 11.0* 10.3*  < > 11.6* 12.1* 11.7* 11.7* 11.5*  HCT 31.8* 30.2*  < > 32.4* 33.6* 33.6* 31.9* 31.9*  MCV 80.1 80.5  < > 79.2 78.7 78.7 78.0 78.2  PLT 117* 102*  < > 117* 159 216 238 295  < > = values in this interval not displayed. Basic Metabolic  Panel:  Recent Labs Lab 05/12/16 0715  05/14/16 0724 05/15/16 0455 05/16/16 0446 05/17/16 0816 05/18/16 0824  NA  --   < > 133* 132* 132* 132* 133*  K  --   < > 3.7 4.2 4.2 3.8 3.9  CL  --   < > 112* 106 103 102 101  CO2  --   < > 15* 16*  18* 19* 18*  GLUCOSE  --   < > 158* 175* 166* 154* 119*  BUN  --   < > 42* 50* 70* 81* 89*  CREATININE  --   < > 3.19* 4.37* 5.37* 5.68* 6.68*  CALCIUM  --   < > 7.4* 7.6* 7.4* 7.2* 7.2*  MG 1.6*  --   --   --   --   --   --   PHOS  --   --   --  5.8* 6.8* 7.1* 7.5*  < > = values in this interval not displayed. GFR: Estimated Creatinine Clearance: 10.4 mL/min (A) (by C-G formula based on SCr of 6.68 mg/dL (H)). Liver Function Tests:  Recent Labs Lab 05/12/16 0050 05/12/16 0533 05/15/16 0455 05/16/16 0446 05/17/16 0816 05/18/16 0824  AST 15 14*  --   --   --   --   ALT 31 25  --   --   --   --   ALKPHOS 48 45  --   --   --   --   BILITOT 0.8 1.0  --   --   --   --   PROT 5.0* 4.4*  --   --   --   --   ALBUMIN 2.4* 2.1* 1.8* 1.8* 1.8* 1.7*    Recent Labs Lab 05/12/16 0050  LIPASE 16   No results for input(s): AMMONIA in the last 168 hours. Coagulation Profile: No results for input(s): INR, PROTIME in the last 168 hours. Cardiac Enzymes:  Recent Labs Lab 05/12/16 0533 05/12/16 1133 05/12/16 1915  TROPONINI 0.07* 0.10* 0.06*   BNP (last 3 results) No results for input(s): PROBNP in the last 8760 hours. HbA1C: No results for input(s): HGBA1C in the last 72 hours. CBG:  Recent Labs Lab 05/17/16 1206 05/17/16 1711 05/17/16 2125 05/18/16 0758 05/18/16 1146  GLUCAP 249* 179* 143* 124* 148*   Lipid Profile: No results for input(s): CHOL, HDL, LDLCALC, TRIG, CHOLHDL, LDLDIRECT in the last 72 hours. Thyroid Function Tests: No results for input(s): TSH, T4TOTAL, FREET4, T3FREE, THYROIDAB in the last 72 hours. Anemia Panel: No results for input(s): VITAMINB12, FOLATE, FERRITIN, TIBC, IRON, RETICCTPCT in the last 72  hours. Urine analysis:    Component Value Date/Time   COLORURINE AMBER (A) 05/17/2016 1736   APPEARANCEUR HAZY (A) 05/17/2016 1736   LABSPEC 1.012 05/17/2016 1736   PHURINE 5.0 05/17/2016 1736   GLUCOSEU NEGATIVE 05/17/2016 1736   HGBUR MODERATE (A) 05/17/2016 1736   BILIRUBINUR NEGATIVE 05/17/2016 1736   KETONESUR NEGATIVE 05/17/2016 1736   PROTEINUR NEGATIVE 05/17/2016 1736   NITRITE NEGATIVE 05/17/2016 1736   LEUKOCYTESUR TRACE (A) 05/17/2016 1736    Recent Results (from the past 240 hour(s))  C difficile quick scan w PCR reflex     Status: Abnormal   Collection Time: 05/12/16  4:00 AM  Result Value Ref Range Status   C Diff antigen POSITIVE (A) NEGATIVE Final   C Diff toxin POSITIVE (A) NEGATIVE Final   C Diff interpretation Toxin producing C. difficile detected.  Final    Comment: RESULT CALLED TO, READ BACK BY AND VERIFIED WITH: KAYLA PRICE AT [place] ON May 12 2016 BY [place]     Radiology Studies: Dg Chest Port 1 View  Result Date: 05/17/2016 CLINICAL DATA:  71 year old male with leukocytosis and general malaise EXAM: PORTABLE CHEST 1 VIEW COMPARISON:  Prior chest x-ray 05/12/2016 FINDINGS: Bilateral basilar hazy airspace opacities most consistent with bilateral layering pleural effusions  and associated atelectasis. The cardiac and mediastinal contours are unchanged. The visualized upper abdominal bowel gas pattern is unremarkable. No acute osseous abnormality. Inspiratory volumes are low. IMPRESSION: 1. Layering right pleural effusion and associated atelectasis. 2. Nonspecific left basilar opacity may also reflect pleural fluid with atelectasis. Superimposed airspace infiltrate/pneumonia is difficult to exclude on the left. Electronically Signed   By: Jacqulynn Cadet M.D.   On: 05/17/2016 14:41   Scheduled Meds: . acyclovir  200 mg Oral BID  . aspirin EC  81 mg Oral Daily  . atorvastatin  20 mg Oral Daily  . enoxaparin (LOVENOX) injection  30 mg Subcutaneous Q24H  .  feeding supplement (GLUCERNA SHAKE)  237 mL Oral TID BM  . insulin aspart  0-9 Units Subcutaneous TID WC  . insulin detemir  15 Units Subcutaneous QHS  . lactobacillus acidophilus  2 tablet Oral TID  . metoprolol succinate  75 mg Oral Daily  . multivitamin with minerals  1 tablet Oral Daily  . pantoprazole  40 mg Oral Daily  . sodium bicarbonate  650 mg Oral TID  . vancomycin  500 mg Oral Q6H   Continuous Infusions: . furosemide    . metronidazole Stopped (05/18/16 0723)    LOS: 6 days   Time spent: 21 min  Irwin Brakeman, MD [facility] Pager [phone]  If 7PM-7AM, please contact night-coverage [email or web address] Password [facility] 05/18/2016, 12:45 PM

## 2016-05-18 NOTE — Progress Notes (Signed)
S: Feels bad "all over"  CO abd pain, pain in legs.  PO intake marginal O:BP 117/66 (BP Location: Left Arm)   Pulse (!) 104   Temp 97.7 F (36.5 C) (Oral)   Resp 18   Ht _0  (1.803 m)   Wt 73.9 kg (163 lb)   SpO2 100%   BMI 22.73 kg/m   Intake/Output Summary (Last 24 hours) at 05/18/16 1003 Last data filed at 05/18/16 0329  Gross per 24 hour  Intake                0 ml  Output              400 ml  Net             -400 ml   Weight change:  XBM:WUXLK and alert CVS: RRR Resp:clear Abd:+ BS Mild Lt upper quadrant tenderness, mild distention Ext: 1+ edema NEURO:CNI Ox3 no asterixis   . acyclovir  200 mg Oral BID  . aspirin EC  81 mg Oral Daily  . atorvastatin  20 mg Oral Daily  . enoxaparin (LOVENOX) injection  30 mg Subcutaneous Q24H  . feeding supplement (GLUCERNA SHAKE)  237 mL Oral TID BM  . insulin aspart  0-9 Units Subcutaneous TID WC  . insulin detemir  15 Units Subcutaneous QHS  . lactobacillus acidophilus  2 tablet Oral TID  . metoprolol succinate  75 mg Oral Daily  . multivitamin with minerals  1 tablet Oral Daily  . pantoprazole  40 mg Oral Daily  . sodium bicarbonate  650 mg Oral TID  . vancomycin  500 mg Oral Q6H   Dg Chest Port 1 View  Result Date: 05/17/2016 CLINICAL DATA:  71 year old male with leukocytosis and general malaise EXAM: PORTABLE CHEST 1 VIEW COMPARISON:  Prior chest x-ray 05/12/2016 FINDINGS: Bilateral basilar hazy airspace opacities most consistent with bilateral layering pleural effusions and associated atelectasis. The cardiac and mediastinal contours are unchanged. The visualized upper abdominal bowel gas pattern is unremarkable. No acute osseous abnormality. Inspiratory volumes are low. IMPRESSION: 1. Layering right pleural effusion and associated atelectasis. 2. Nonspecific left basilar opacity may also reflect pleural fluid with atelectasis. Superimposed airspace infiltrate/pneumonia is difficult to exclude on the left. Electronically  Signed   By: Jacqulynn Cadet M.D.   On: 05/17/2016 14:41   BMET    Component Value Date/Time   NA 133 (L) 05/18/2016 0824   NA 138 05/06/2016 1302   K 3.9 05/18/2016 0824   K 4.0 05/06/2016 1302   CL 101 05/18/2016 0824   CO2 18 (L) 05/18/2016 0824   CO2 27 05/06/2016 1302   GLUCOSE 119 (H) 05/18/2016 0824   GLUCOSE 289 (H) 05/06/2016 1302   BUN 89 (H) 05/18/2016 0824   BUN 19.2 05/06/2016 1302   CREATININE 6.68 (H) 05/18/2016 0824   CREATININE 1.5 (H) 05/06/2016 1302   CALCIUM 7.2 (L) 05/18/2016 0824   CALCIUM 9.1 05/06/2016 1302   GFRNONAA 7 (L) 05/18/2016 0824   GFRAA 9 (L) 05/18/2016 0824   CBC    Component Value Date/Time   WBC 14.1 (H) 05/18/2016 0824   RBC 4.08 (L) 05/18/2016 0824   HGB 11.5 (L) 05/18/2016 0824   HGB 9.8 (L) 05/06/2016 1302   HCT 31.9 (L) 05/18/2016 0824   HCT 29.2 (L) 05/06/2016 1302   PLT 295 05/18/2016 0824   PLT 171 05/06/2016 1302   MCV 78.2 05/18/2016 0824   MCV 82.7 05/06/2016 1302   MCH 28.2  05/18/2016 0824   MCHC 36.1 (H) 05/18/2016 0824   RDW 18.6 (H) 05/18/2016 0824   RDW 16.9 (H) 05/06/2016 1302   LYMPHSABS 0.5 (L) 05/12/2016 0533   LYMPHSABS 0.3 (L) 05/06/2016 1302   MONOABS 1.2 (H) 05/12/2016 0533   MONOABS 0.1 05/06/2016 1302   EOSABS 0.0 05/12/2016 0533   EOSABS 0.0 05/06/2016 1302   BASOSABS 0.0 05/12/2016 0533   BASOSABS 0.0 05/06/2016 1302     Assessment: 1. Acute on CKD 3 sec contrast nephropathy.  Scr higher, UO yest not much though UO appears to be increasing based on the urine in his foley 2. Multiple Myeloma 3. HTN 4. DM 5. C diff colitis 6. Met acidosis on oral bicarb  Plan: 1. Cont lasix 2. Daily labs.  He is aware of potential need for HD if renal fx does not improve soon.  I am optimisitic renal fx will recover, it is just a matter of time and he may need HD before it does.  He would like to put off as long as possible but if no improvement in next 1-2d then will need it    Devone Bonilla T

## 2016-05-19 LAB — RENAL FUNCTION PANEL
ALBUMIN: 1.8 g/dL — AB (ref 3.5–5.0)
Anion gap: 14 (ref 5–15)
BUN: 98 mg/dL — AB (ref 6–20)
CHLORIDE: 100 mmol/L — AB (ref 101–111)
CO2: 20 mmol/L — ABNORMAL LOW (ref 22–32)
Calcium: 7.2 mg/dL — ABNORMAL LOW (ref 8.9–10.3)
Creatinine, Ser: 5.92 mg/dL — ABNORMAL HIGH (ref 0.61–1.24)
GFR calc Af Amer: 10 mL/min — ABNORMAL LOW (ref >60)
GFR calc non Af Amer: 9 mL/min — ABNORMAL LOW (ref >60)
GLUCOSE: 170 mg/dL — AB (ref 65–99)
PHOSPHORUS: 8.7 mg/dL — AB (ref 2.5–4.6)
Potassium: 4.2 mmol/L (ref 3.5–5.1)
Sodium: 134 mmol/L — ABNORMAL LOW (ref 135–145)

## 2016-05-19 LAB — CBC
HEMATOCRIT: 32.1 % — AB (ref 39.0–52.0)
Hemoglobin: 11.7 g/dL — ABNORMAL LOW (ref 13.0–17.0)
MCH: 28.9 pg (ref 26.0–34.0)
MCHC: 36.4 g/dL — AB (ref 30.0–36.0)
MCV: 79.3 fL (ref 78.0–100.0)
PLATELETS: 323 10*3/uL (ref 150–400)
RBC: 4.05 MIL/uL — ABNORMAL LOW (ref 4.22–5.81)
RDW: 19.1 % — AB (ref 11.5–15.5)
WBC: 12.7 10*3/uL — ABNORMAL HIGH (ref 4.0–10.5)

## 2016-05-19 LAB — GLUCOSE, CAPILLARY
GLUCOSE-CAPILLARY: 210 mg/dL — AB (ref 65–99)
GLUCOSE-CAPILLARY: 233 mg/dL — AB (ref 65–99)
Glucose-Capillary: 168 mg/dL — ABNORMAL HIGH (ref 65–99)
Glucose-Capillary: 265 mg/dL — ABNORMAL HIGH (ref 65–99)

## 2016-05-19 MED ORDER — ACYCLOVIR 200 MG PO CAPS
200.0000 mg | ORAL_CAPSULE | Freq: Every day | ORAL | Status: DC
  Administered 2016-05-20 – 2016-05-31 (×12): 200 mg via ORAL
  Filled 2016-05-19 (×13): qty 1

## 2016-05-19 NOTE — Progress Notes (Signed)
Benefits check for oral vancomycin:  Roselind Messier, RN        # 2.  S/W [place] @ [employer] [facility] # [phone]   VANCOMYCIN 500 MG Q 6H   COVER- YES  C-PAY- $ 47.00  TIER- 3 DRUG  PRIOR APPROVAL- NO   PREFERRED PHARMACY : [facility] AND [facility]   Previous Messages    ----- Message -----  From: Cecille Rubin, RN  Sent: 05/18/2016  9:41 PM  To: [facility] Case Mgr Assistant  Subject: benefits check                  Please check vancomycin 500 mg oral Q 6H, [place]. thanks       Whitman Hero RN,BSN,CM

## 2016-05-19 NOTE — Progress Notes (Signed)
Referring Physician(s): Shirl Harris  Supervising Physician: Aletta Edouard  Patient Status:  [facility] - In-pt  Chief Complaint:  Renal failure  Subjective: Pt familiar to IR service from prior bone marrow biopsy in March of this year. He has hx multiple myeloma, recently treated submandibular abscess and now admitted with sepsis secondary to C diff colitis and AKI. Creatinine currently 5.92(6.68), BUN 98. Request received from nephrology for nontunneled HD cath if creat does not continue downward trend. Additional hx as below. Pt currently appears weak with diarrhea, intermittent abd cramping/nausea.  Past Medical History:  Diagnosis Date  . CAD (coronary artery disease) 2006   stent LAD 3.0x18 Cypher, occluded RCA  . Cancer ([place])    multiple myeloma  . Diabetes mellitus without complication ([place])   . H/O non-insulin dependent diabetes mellitus   . Hyperlipidemia   . Hypertension    Past Surgical History:  Procedure Laterality Date  . CARDIAC CATHETERIZATION  2006    '  Allergies: Patient has no known allergies.  Medications: Prior to Admission medications   Medication Sig Start Date End Date Taking? Authorizing Provider  acyclovir (ZOVIRAX) 200 MG capsule Take 1 capsule (200 mg total) by mouth 2 (two) times daily. 04/08/16  Yes Brunetta Genera, MD  amLODipine (NORVASC) 10 MG tablet Take 10 mg by mouth daily.   Yes [provider]  aspirin 81 MG tablet Take 81 mg by mouth daily.   Yes [provider]  atorvastatin (LIPITOR) 20 MG tablet Take 1 tablet (20 mg total) by mouth daily. 12/06/12  Yes Burtis Junes, NP  chlorhexidine (PERIDEX) 0.12 % solution Use as directed 15 mLs in the mouth or throat 2 (two) times daily. 04/15/16  Yes Brunetta Genera, MD  cyclophosphamide (CYTOXAN) 50 MG tablet Take 12 tablets (600 mg total) by mouth once a week. Take days 1,8,15 of chemo 1hr before or 2hr after meals every 21d. Capsules. ICD:C90.00 04/03/16  Yes  Brunetta Genera, MD  dexamethasone (DECADRON) 4 MG tablet Take 5 tablets (20 mg) on days 1, 8, and 15 of chemo. Repeat every 21 days. Take 2 tablets (41m) daily for 2 days after days1 and 8 of chemo 03/31/16  Yes KBrunetta Genera MD  docusate sodium (COLACE) 100 MG capsule Take 1 capsule (100 mg total) by mouth every 12 (twelve) hours. 05/11/16  Yes WRipley Fraise MD  insulin aspart (NOVOLOG) 100 UNIT/ML FlexPen Three times a day before meals per sliding scale. between 2-6 on average 04/09/16  Yes [provider]  Insulin Glargine (LANTUS SOLOSTAR) 100 UNIT/ML Solostar Pen Inject 5 Units into the skin daily.  03/25/16  Yes [provider]  metoprolol succinate (TOPROL-XL) 25 MG 24 hr tablet Take 3 tablets (75 mg total) by mouth daily. Take with or immediately following a meal. 03/05/16  Yes GVelna Ochs MD  nitroGLYCERIN (NITROSTAT) 0.4 MG SL tablet Place 1 tablet (0.4 mg total) under the tongue every 5 (five) minutes x 3 doses as needed for chest pain. 11/13/12  Yes Edmisten, Brooke O, PA-C  ondansetron (ZOFRAN) 8 MG tablet Take 1 tablet (8 mg total) by mouth 2 (two) times daily as needed for refractory nausea / vomiting. 03/31/16  Yes KBrunetta Genera MD  prochlorperazine (COMPAZINE) 10 MG tablet Take 1 tablet (10 mg total) by mouth every 6 (six) hours as needed (Nausea or vomiting). 03/31/16  Yes KBrunetta Genera MD  tamsulosin (FLOMAX) 0.4 MG CAPS capsule Take 1 capsule (0.4 mg total)  by mouth daily after supper. 03/04/16  Yes Velna Ochs, MD     Vital Signs: BP 103/64   Pulse 71   Temp 97.5 F (36.4 C)   Resp 18   Ht _0  (1.803 m)   Wt 163 lb (73.9 kg)   SpO2 99%   BMI 22.73 kg/m   Physical Exam awake but weak appearing; answers questions appropriately; chest- dim BS bases; heart- tachy but reg; abd- soft,+BS,mild gen tenderness; LE- 1-2+ edema bilat   Imaging: Dg Chest Port 1 View  Result Date: 05/17/2016 CLINICAL DATA:  71 year old  male with leukocytosis and general malaise EXAM: PORTABLE CHEST 1 VIEW COMPARISON:  Prior chest x-ray 05/12/2016 FINDINGS: Bilateral basilar hazy airspace opacities most consistent with bilateral layering pleural effusions and associated atelectasis. The cardiac and mediastinal contours are unchanged. The visualized upper abdominal bowel gas pattern is unremarkable. No acute osseous abnormality. Inspiratory volumes are low. IMPRESSION: 1. Layering right pleural effusion and associated atelectasis. 2. Nonspecific left basilar opacity may also reflect pleural fluid with atelectasis. Superimposed airspace infiltrate/pneumonia is difficult to exclude on the left. Electronically Signed   By: Jacqulynn Cadet M.D.   On: 05/17/2016 14:41    Labs:  CBC:  Recent Labs  05/16/16 0446 05/17/16 0816 05/18/16 0824 05/19/16 0700  WBC 11.0* 14.2* 14.1* 12.7*  HGB 11.7* 11.7* 11.5* 11.7*  HCT 33.6* 31.9* 31.9* 32.1*  PLT 216 238 295 323    COAGS:  Recent Labs  03/13/16 0859  INR 1.03    BMP:  Recent Labs  05/16/16 0446 05/17/16 0816 05/18/16 0824 05/19/16 0700  NA 132* 132* 133* 134*  K 4.2 3.8 3.9 4.2  CL 103 102 101 100*  CO2 18* 19* 18* 20*  GLUCOSE 166* 154* 119* 170*  BUN 70* 81* 89* 98*  CALCIUM 7.4* 7.2* 7.2* 7.2*  CREATININE 5.37* 5.68* 6.68* 5.92*  GFRNONAA 10* 9* 7* 9*  GFRAA 11* 10* 9* 10*    LIVER FUNCTION TESTS:  Recent Labs  05/06/16 1302 05/10/16 2258 05/12/16 0050 05/12/16 0533  05/16/16 0446 05/17/16 0816 05/18/16 0824 05/19/16 0700  BILITOT 0.39 0.8 0.8 1.0  --   --   --   --   --   AST _1 14*  --   --   --   --   --   ALT 118* 42 31 25  --   --   --   --   --   ALKPHOS 75 48 48 45  --   --   --   --   --   PROT 6.9 5.8* 5.0* 4.4*  --   --   --   --   --   ALBUMIN 3.4* 2.8* 2.4* 2.1*  < > 1.8* 1.8* 1.7* 1.8*  < > = values in this interval not displayed.  Assessment and Plan: Pt with hx MM, DM, c diff sepsis, acute on CKD (creat 5.92), HTN,  CAD, anemia, mandibular abscess; request received from nephrology for nontunneled HD cath if creat cont to worsen. Details/risks of procedure, incl but not limited to, internal bleeding, infection, injury to adjacent structures d/w pt with his understanding and consent. Will cont to monitor.    Electronically Signed: D. Nash Mantis 05/19/2016, 12:40 PM   I spent a total of 20 minutes at the the patient's bedside AND on the patient's hospital floor or unit, greater than 50% of which was counseling/coordinating care for hemodialysis catheter placement    Patient  ID: Douglas Rose., male   DOB: 1945/04/09, 71 y.o.   MRN: [id number]

## 2016-05-19 NOTE — Progress Notes (Signed)
I spoke with urologist on call and we are concerned about paraphimosis because patient is uncircumcised.  Dr. Pilar Jarvis said will come and evaluate patient tonight and he is not far away.  I spoke with patient's wife and updated her and she verbalized understanding.   Murvin Natal MD

## 2016-05-19 NOTE — Progress Notes (Signed)
Subjective:  Feels and looks very weak... UOP more than it has been and creatinine down, but BUN up.  Foley cath was removed this AM due to swelling   Objective Vital signs in last 24 hours: Vitals:   05/18/16 1729 05/18/16 2217 05/19/16 0343 05/19/16 0945  BP: 108/78 127/73 134/84 103/64  Pulse: (!) 101 99 (!) 101 71  Resp: 20 16 18    Temp: 97.6 F (36.4 C) 97.5 F (36.4 C) 97.5 F (36.4 C)   TempSrc:      SpO2: 97% 98% 99%   Weight:      Height:       Weight change:   Intake/Output Summary (Last 24 hours) at 05/19/16 1126 Last data filed at 05/19/16 1021  Gross per 24 hour  Intake                0 ml  Output              950 ml  Net             -950 ml    Assessment/ Plan: Pt is a 71 y.o. yo male with baseline MM and creatinine in the mid 1's.  who was admitted on 05/11/2016 with C diff colitis- had contrasted CT.  Course complicated by AKI  Assessment/Plan: 1. Renal- creatinine 5/5 was 1.67 but has worsened every day he has been in house EXCEPT for today it has actually trended down with increased UOP.  However, BUN did go up and patient seems clinically uremic.  I want to give it one more day to see if it will continue to improve so that we may be able to avoid HD but if BUN worsens again with his symptoms he is willing to under go HD.  Given baseline crt good would hope only temporary and not permanent.  I also have concerns abut relatively high dose valcyte so will decrease to more appropriate dose based on renal function. Will need to make sure he can void without foley  2. HTN/vol- overloaded- BP marginal  - high dose lasix and lopressor- given tachycardia will cont both 3. Anemia- due to CKD and multiple myeloma- hgb over 11- supportive care  4. Metabolic acidosis- on oral bicarb 5.C Diff- still symptomatic- on oral vanc and flagyl   I have written order for HD access in am if numbers and patient not better    Chantelle Verdi A    Labs: Basic Metabolic  Panel:  Recent Labs Lab 05/17/16 0816 05/18/16 0824 05/19/16 0700  NA 132* 133* 134*  K 3.8 3.9 4.2  CL 102 101 100*  CO2 19* 18* 20*  GLUCOSE 154* 119* 170*  BUN 81* 89* 98*  CREATININE 5.68* 6.68* 5.92*  CALCIUM 7.2* 7.2* 7.2*  PHOS 7.1* 7.5* 8.7*   Liver Function Tests:  Recent Labs Lab 05/17/16 0816 05/18/16 0824 05/19/16 0700  ALBUMIN 1.8* 1.7* 1.8*   No results for input(s): LIPASE, AMYLASE in the last 168 hours. No results for input(s): AMMONIA in the last 168 hours. CBC:  Recent Labs Lab 05/15/16 0455 05/16/16 0446 05/17/16 0816 05/18/16 0824 05/19/16 0700  WBC 8.9 11.0* 14.2* 14.1* 12.7*  HGB 12.1* 11.7* 11.7* 11.5* 11.7*  HCT 33.6* 33.6* 31.9* 31.9* 32.1*  MCV 78.7 78.7 78.0 78.2 79.3  PLT 159 216 238 295 323   Cardiac Enzymes:  Recent Labs Lab 05/12/16 1133 05/12/16 1915  TROPONINI 0.10* 0.06*   CBG:  Recent Labs Lab 05/18/16 0758 05/18/16 1146  05/18/16 1730 05/18/16 2214 05/19/16 0811  GLUCAP 124* 148* 159* 161* 168*    Iron Studies: No results for input(s): IRON, TIBC, TRANSFERRIN, FERRITIN in the last 72 hours. Studies/Results: Dg Chest Port 1 View  Result Date: 05/17/2016 CLINICAL DATA:  71 year old male with leukocytosis and general malaise EXAM: PORTABLE CHEST 1 VIEW COMPARISON:  Prior chest x-ray 05/12/2016 FINDINGS: Bilateral basilar hazy airspace opacities most consistent with bilateral layering pleural effusions and associated atelectasis. The cardiac and mediastinal contours are unchanged. The visualized upper abdominal bowel gas pattern is unremarkable. No acute osseous abnormality. Inspiratory volumes are low. IMPRESSION: 1. Layering right pleural effusion and associated atelectasis. 2. Nonspecific left basilar opacity may also reflect pleural fluid with atelectasis. Superimposed airspace infiltrate/pneumonia is difficult to exclude on the left. Electronically Signed   By: Jacqulynn Cadet M.D.   On: 05/17/2016 14:41    Medications: Infusions: . furosemide Stopped (05/19/16 0717)  . metronidazole Stopped (05/19/16 0717)    Scheduled Medications: . acyclovir  200 mg Oral BID  . aspirin EC  81 mg Oral Daily  . atorvastatin  20 mg Oral Daily  . enoxaparin (LOVENOX) injection  30 mg Subcutaneous Q24H  . feeding supplement (GLUCERNA SHAKE)  237 mL Oral TID BM  . insulin aspart  0-9 Units Subcutaneous TID WC  . insulin detemir  15 Units Subcutaneous QHS  . lactobacillus acidophilus  2 tablet Oral TID  . metoprolol succinate  75 mg Oral Daily  . multivitamin with minerals  1 tablet Oral Daily  . pantoprazole  40 mg Oral Daily  . sodium bicarbonate  650 mg Oral TID  . vancomycin  500 mg Oral Q6H    have reviewed scheduled and prn medications.  Physical Exam: General:very weak- cannot move head- says feels terrible- nauseated and weak Heart: tachy Lungs: mostly clear Abdomen: soft, non tender Extremities: pitting edema throughout including scrotum    05/19/2016,11:26 AM  LOS: 7 days

## 2016-05-19 NOTE — Progress Notes (Signed)
Nurse Tech called to inform me about blood in the stool, I  was in room to assess stool. Pt had only been eating red jello which ar a result was seen in the stool. There was no any indication for blood in the stool. The subsequent stool was normal, there was no redness noted.

## 2016-05-19 NOTE — Progress Notes (Signed)
MD ordered indwelling cath to be removed due to swollen penis. Catheter was removed at 1130 am. MD Wynetta Emery was aware of swollen penis.

## 2016-05-19 NOTE — Progress Notes (Signed)
Patient states "I can't move my legs" Vital Signs taken, neuro assessment done. Although weak, he can move his legs. Will continue to monitor patient.

## 2016-05-19 NOTE — Progress Notes (Addendum)
Physical Therapy Treatment Patient Details Name: Douglas Rose. MRN: [id number] DOB: March 07, 1945 Today's Date: 05/19/2016    History of Present Illness Sebastyan Snodgrass. is a 71 y.o. male with history of multiple myeloma on chemotherapy, recent history of C. Diff colitis, who was placed on clindamycin for the last 3 weeks for mandibular abscess who presented to the ER with complaints of diffuse crampy abdominal pain with multiple episodes of diarrhea.  He was found to have sepsis due to C. Diff colitis.  His course has been complicated by ongoing sinus tachycardia and AKI.     PT Comments    Pt continues to feel weak and frequent statements of "I can't", but actually moved with only MIN A with 2nd person for safety.  He did demonstrate slow processing at times and needed cues to hold phone up to ear when he received phone call.  Encouraged pt to work with PT and [place] activities.  Con't to recommend SNF.   Follow Up Recommendations  SNF     Equipment Recommendations  None recommended by PT    Recommendations for Other Services       Precautions / Restrictions Precautions Precautions: Fall Restrictions Weight Bearing Restrictions: No    Mobility  Bed Mobility Overal bed mobility: Needs Assistance Bed Mobility: Rolling;Sidelying to Sit Rolling: Min assist Sidelying to sit: Min assist;+2 for safety/equipment       General bed mobility comments: heavy cues, but moved with min A  Transfers Overall transfer level: Needs assistance Equipment used: Rolling walker (2 wheeled) Transfers: Sit to/from [facility] Sit to Stand: Min assist;+2 safety/equipment Stand pivot transfers: Min assist;+2 safety/equipment       General transfer comment: SPT only due to loose stool.  Stood 2nd time to get cleaned  Ambulation/Gait             General Gait Details: deferred   Financial [employer] [place] (Stroke Patients  Only)       Balance                                            Cognition Arousal/Alertness: Awake/alert Behavior During Therapy: Flat affect Overall Cognitive Status: No family/caregiver present to determine baseline cognitive functioning Area of Impairment: Problem solving                             Problem Solving: Slow processing;Decreased initiation;Requires tactile cues;Requires verbal cues General Comments: Pt demostrating slow processing.  At end of session, phone ringing and he was able to answer with cues, but then left in lap attempting to talk.  Needed cues to hold up to his face.      Exercises      General Comments        Pertinent Vitals/Pain Pain Assessment: Faces Faces Pain Scale: Hurts a little bit Pain Location: bottom from cdiff Pain Descriptors / Indicators: Burning Pain Intervention(s): Limited activity within patient's tolerance;Repositioned    Home Living                      Prior Function            PT Goals (current goals can now be found in the care plan section) Acute  Rehab PT Goals PT Goal Formulation: With patient Time For Goal Achievement: 05/31/16 Potential to Achieve Goals: Fair Progress towards PT goals: Progressing toward goals    Frequency    Min 3X/week      PT Plan Current plan remains appropriate    Co-evaluation              AM-PAC PT "6 Clicks" Daily Activity  Outcome Measure  Difficulty turning over in bed (including adjusting bedclothes, sheets and blankets)?: Total Difficulty moving from lying on back to sitting on the side of the bed? : Total Difficulty sitting down on and standing up from a chair with arms (e.g., wheelchair, bedside commode, etc,.)?: Total Help needed moving to and from a bed to chair (including a wheelchair)?: A Little Help needed walking in hospital room?: A Lot Help needed climbing 3-5 steps with a railing? : A Lot 6 Click Score: 10     End of Session Equipment Utilized During Treatment: Gait belt Activity Tolerance: Patient limited by fatigue Patient left: in chair;with call bell/phone within reach;with chair alarm set Nurse Communication: Mobility status PT Visit Diagnosis: Muscle weakness (generalized) (M62.81)     Time: 1350-1410 PT Time Calculation (min) (ACUTE ONLY): 20 min  Charges:  $Therapeutic Activity: 8-22 mins                    G Codes:       Kali Deadwyler L. Tamala Julian, [place] Pager [phone] 05/19/2016    [name] 05/19/2016, 2:27 PM

## 2016-05-19 NOTE — Progress Notes (Signed)
MD, primary nurse and charge nurse were all made aware of pt edematous penis. This was the reason indwelling cath was removed in the the first place. Wife was concern when she saw husband swollen penis, while he was being cleaned after a bowel movement.  She alerted me about it. I paged the attending MD to come in the room twice to talk with the wife, and I also called the charge nurse to come and speak with wife. Charge nurse came in and talked to wife and also paged MD to come and speak to wife. Order was put in to do bladder scan, this was done and 68 ML of urine was noted during bladder scan. Wife refused in and out cath, as ordered by the MD.

## 2016-05-19 NOTE — Consult Note (Addendum)
05/19/16 7:52 PM   Douglas Rose. 1945-07-24 [id number]  Referring provider: Dr. Irwin Brakeman  CC: Paraphimosis  HPI: The patient is 71 year old gentleman who was admitted to the hospital for acute renal failure. Urology was consulted for distal penile swelling and a uncircumcised gentleman that was concerning for a paraphimosis.  1. Paraphimosis The patient a Foley catheter placed and it was removed today after low urine output from his AK I. The patient and his family as noted increased swelling of his distal penis since his Foley catheter was removed. The patient is uncircumcised but is never had a problem with paraphimosis or phimosis in the past. The patient's wife did show me a picture which showed a paraphimosis with his foreskin withdrawn and edematous in his glans is visible. This has never happened the patient before. It is not painful.  2. AKI The patient does have acute renal failure. He is making very slow urine times this time. His urine output has been minimal. A recent renal ultrasound during this admission did show no hydronephrosis or nephrolithiasis.     PMH: Past Medical History:  Diagnosis Date  . CAD (coronary artery disease) 2006   stent LAD 3.0x18 [place], occluded RCA  . Cancer ([place])    multiple myeloma  . Diabetes mellitus without complication ([place])   . H/O non-insulin dependent diabetes mellitus   . Hyperlipidemia   . Hypertension     Surgical History: Past Surgical History:  Procedure Laterality Date  . CARDIAC CATHETERIZATION  2006     Allergies: No Known Allergies  Family History: Family History  Problem Relation Age of Onset  . Arrhythmia Mother   . Diabetes Sister   . Diabetes Brother   . Diabetes Brother     Social History:  reports that he has never smoked. He has never used smokeless tobacco. He reports that he does not drink alcohol or use drugs.  ROS: 12 point ROS negative except for above                                         Physical Exam: BP 103/66 (BP Location: Right Arm)   Pulse (!) 101   Temp 98.6 F (37 C)   Resp 18   Ht _0  (1.803 m)   Wt 163 lb (73.9 kg)   SpO2 99%   BMI 22.73 kg/m   Constitutional:  Alert and oriented, No acute distress. HEENT: [place] AT, moist mucus membranes.  Trachea midline, no masses. Cardiovascular: No clubbing, cyanosis, or edema. Respiratory: Normal respiratory effort, no increased work of breathing. GI: Abdomen is soft, nontender, nondistended, no abdominal masses GU: No CVA tenderness. Phallus with foreskin reduced. The foreskin is edematous at this time however it is properly reduce the normal position. Testicles descended equally bilaterally nontender to palpation. Skin: No rashes, bruises or suspicious lesions. Lymph: No cervical or inguinal adenopathy. Neurologic: Grossly intact, no focal deficits, moving all 4 extremities. Psychiatric: Normal mood and affect.  Laboratory Data: Lab Results  Component Value Date   WBC 12.7 (H) 05/19/2016   HGB 11.7 (L) 05/19/2016   HCT 32.1 (L) 05/19/2016   MCV 79.3 05/19/2016   PLT 323 05/19/2016    Lab Results  Component Value Date   CREATININE 5.92 (H) 05/19/2016     Lab Results  Component Value Date   HGBA1C 9.7 (H) 11/13/2012    Urinalysis  Component Value Date/Time   COLORURINE AMBER (A) 05/17/2016 1736   APPEARANCEUR HAZY (A) 05/17/2016 1736   LABSPEC 1.012 05/17/2016 1736   PHURINE 5.0 05/17/2016 1736   GLUCOSEU NEGATIVE 05/17/2016 1736   HGBUR MODERATE (A) 05/17/2016 1736   BILIRUBINUR NEGATIVE 05/17/2016 1736   KETONESUR NEGATIVE 05/17/2016 1736   PROTEINUR NEGATIVE 05/17/2016 1736   NITRITE NEGATIVE 05/17/2016 1736   LEUKOCYTESUR TRACE (A) 05/17/2016 1736    Pertinent Imaging: Renal u/s reviewed. No hydronephrosis or stones  Assessment & Plan:    1. Paraphimosis When I examined the patient, his foreskin was reduced appropriately. He does have edema of his  prepuce likely from recent paraphimosis. In review of the wife's photos, he did in fact have a paraphimosis at the time of the photo that was reduced prior to my examination. At this point, no further intervention is needed.  2. AKI The patient is in acute renal failure. Postobstructive etiology has been ruled out. At this point, I do not think the patient needs a Foley catheter or intermittent catheterization. His residual edema from his recent paraphimosis would make this difficult, and since his acute renal failure is not postobstructive in nature, it is not necessary. Further management of AKI per nephrology.  Nickie Retort, MD

## 2016-05-19 NOTE — Progress Notes (Signed)
PROGRESS NOTE  Douglas Rose.  HCW:[id number] DOB: 1945/11/14 DOA: 05/11/2016 PCP: [name], MD  Brief Narrative:   Douglas Rose. is a 72 y.o. male with history of multiple myeloma on chemotherapy, recent history of C. Diff colitis, who was placed on clindamycin for the last 3 weeks for mandibular abscess who presented to the ER with complaints of diffuse crampy abdominal pain with multiple episodes of diarrhea.  He was found to have sepsis due to C. Diff colitis.  His course has been complicated by ongoing sinus tachycardia and AKI.   Assessment & Plan:   Principal Problem:   SIRS (systemic inflammatory response syndrome) (HCC) Active Problems:   Coronary artery disease   Hyperlipidemia   Hypertension   Multiple myeloma (HCC)   Colitis   Type 2 diabetes mellitus with vascular disease ([place])   Sinus tachycardia   AKI (acute kidney injury) ([place])   Enteritis due to Clostridium difficile  Sepsis secondary to C. Diff colitis with improving leukocytosis, but persistent tachycardia and AKI. Slowly improving.  - continue IV flagyl - continue oral vancomycin  -  IV dilaudid for pain control as needed -  Continue zofran for nausea symptoms  Sinus tachycardia -  IVF discontinued by nephrology -  Doubt that this is due to adrenal insufficiency as BP are currently stable -  Resumed home metoprolol with improvement  Diabetes mellitus type 2 with hyperglycemia  -  D/c dextrose fluids as tolerating at least some PO -  Stopped steroids after initial dose of hydrocortisone was given at admission -  Continue levemir, no prandial coverage as he is not eating well.  -  Continue moderate dose SSI CBG (last 3)   Recent Labs  05/18/16 1730 05/18/16 2214 05/19/16 0811  GLUCAP 159* 161* 168*   Acute on chronic kidney disease stage 3 due to MM and DM, likely due to dehydration and sepsis and contrast nephropathy.  Creatinine up to 5.3,  baseline of 1.5 -  RUS: normal kidneys    -  Following daily Renal function panel -  Not on ACE/ARB or NSAIDS -  Appreciate renal consult, discussed possibility of HD with patient if renal function continues to deteriorate but hopeful of recovery -  Pt with more symptoms of uremia, but UOP picking up, creatine slightly improved today.  Renal team following closely.   -  Foley discontinued today as penis was found to be swollen, placed condom cath.   Hypertension, blood pressures well controlled.  Following closely.    Metabolic acidosis - started on sodium bicarbonate tablets by nephrology  CAD s/p stenting in 2006, chest pain free -  Troponin negative -  Aspirin, statin, and beta blocker  Anemia and thrombocytopenia most likely from chemotherapy and MM - repeat CBC in AM  Leukocytosis - ? From c diff infection, portable CXR nonspecific for pneumonia, patient too weak to go for 2 view CXR, no fever, no cough, no SOB. Following closely.  Slowly trending down.    MM, followed by [name] - see consult notes   Mandibular abscess, continue flagyl - Pt had been working on following up with an oral surgeon for definitive treatment.    Hypomagnesemia due to diarrhea -  IV magnesium repletion as needed, follow levels.   Hyponatremia due to dehydration and hyperglycemia -  Stable, completed bicarbonate infusion per nephrologist  DVT prophylaxis: Lovenox. Code Status: Full code.  Family Communication: wife at bedside Disposition Plan: TBD  Consultants:  nephrology  Procedures:  none  Antimicrobials:  Anti-infectives    Start     Dose/Rate Route Frequency Ordered Stop   05/20/16 1000  acyclovir (ZOVIRAX) 200 MG capsule 200 mg     200 mg Oral Daily 05/19/16 1137     05/12/16 1000  acyclovir (ZOVIRAX) 200 MG capsule 200 mg  Status:  Discontinued     200 mg Oral 2 times daily 05/12/16 0532 05/19/16 1137   05/12/16 0900  vancomycin (VANCOCIN) 50 mg/mL oral solution 500 mg     500 mg Oral Every 6 hours 05/12/16 0748  05/26/16 0859   05/12/16 0600  metroNIDAZOLE (FLAGYL) IVPB 500 mg     500 mg 100 mL/hr over 60 Minutes Intravenous Every 8 hours 05/12/16 0532     05/12/16 0330  metroNIDAZOLE (FLAGYL) tablet 500 mg     500 mg Oral  Once 05/12/16 0327 05/12/16 0338   05/12/16 0330  ciprofloxacin (CIPRO) tablet 500 mg     500 mg Oral  Once 05/12/16 0327 05/12/16 0338      Subjective: Pt says he still feels terrible.  No appetite, only drinking sips of juice and water and ensure as tolerated.  Loose stools    Objective: Vitals:   05/18/16 1729 05/18/16 2217 05/19/16 0343 05/19/16 0945  BP: 108/78 127/73 134/84 103/64  Pulse: (!) 101 99 (!) 101 71  Resp: 20 16 18    Temp: 97.6 F (36.4 C) 97.5 F (36.4 C) 97.5 F (36.4 C)   TempSrc:      SpO2: 97% 98% 99%   Weight:      Height:        Intake/Output Summary (Last 24 hours) at 05/19/16 1209 Last data filed at 05/19/16 1021  Gross per 24 hour  Intake                0 ml  Output              700 ml  Net             -700 ml   Filed Weights   05/11/16 2353 05/12/16 0501  Weight: 76.2 kg (168 lb) 73.9 kg (163 lb)   Review of systems: As per history otherwise all reviewed negative  Examination:  General exam:  Adult male.  No acute distress.  HEENT:  NCAT, MMM Respiratory system: Clear to auscultation bilaterally Cardiovascular system: normal S1/S2. No murmurs, rubs, gallops or clicks.  Warm extremities Gastrointestinal system: active bowel sounds, soft, nondistended, without rebound or guarding GU: swollen penis glans with foley, (removed at bedside with RN) MSK:  Normal tone and bulk, no lower extremity edema Neuro:  Grossly intact  Data Reviewed: I have personally reviewed following labs and imaging studies  CBC:  Recent Labs Lab 05/15/16 0455 05/16/16 0446 05/17/16 0816 05/18/16 0824 05/19/16 0700  WBC 8.9 11.0* 14.2* 14.1* 12.7*  HGB 12.1* 11.7* 11.7* 11.5* 11.7*  HCT 33.6* 33.6* 31.9* 31.9* 32.1*  MCV 78.7 78.7 78.0 78.2  79.3  PLT 159 216 238 295 388   Basic Metabolic Panel:  Recent Labs Lab 05/15/16 0455 05/16/16 0446 05/17/16 0816 05/18/16 0824 05/19/16 0700  NA 132* 132* 132* 133* 134*  K 4.2 4.2 3.8 3.9 4.2  CL 106 103 102 101 100*  CO2 16* 18* 19* 18* 20*  GLUCOSE 175* 166* 154* 119* 170*  BUN 50* 70* 81* 89* 98*  CREATININE 4.37* 5.37* 5.68* 6.68* 5.92*  CALCIUM 7.6* 7.4* 7.2* 7.2* 7.2*  PHOS 5.8* 6.8* 7.1* 7.5* 8.7*   GFR: Estimated Creatinine Clearance: 11.8 mL/min (A) (by C-G formula based on SCr of 5.92 mg/dL (H)). Liver Function Tests:  Recent Labs Lab 05/15/16 0455 05/16/16 0446 05/17/16 0816 05/18/16 0824 05/19/16 0700  ALBUMIN 1.8* 1.8* 1.8* 1.7* 1.8*   No results for input(s): LIPASE, AMYLASE in the last 168 hours. No results for input(s): AMMONIA in the last 168 hours. Coagulation Profile: No results for input(s): INR, PROTIME in the last 168 hours. Cardiac Enzymes:  Recent Labs Lab 05/12/16 1915  TROPONINI 0.06*   BNP (last 3 results) No results for input(s): PROBNP in the last 8760 hours. HbA1C: No results for input(s): HGBA1C in the last 72 hours. CBG:  Recent Labs Lab 05/18/16 0758 05/18/16 1146 05/18/16 1730 05/18/16 2214 05/19/16 0811  GLUCAP 124* 148* 159* 161* 168*   Lipid Profile: No results for input(s): CHOL, HDL, LDLCALC, TRIG, CHOLHDL, LDLDIRECT in the last 72 hours. Thyroid Function Tests: No results for input(s): TSH, T4TOTAL, FREET4, T3FREE, THYROIDAB in the last 72 hours. Anemia Panel: No results for input(s): VITAMINB12, FOLATE, FERRITIN, TIBC, IRON, RETICCTPCT in the last 72 hours. Urine analysis:    Component Value Date/Time   COLORURINE AMBER (A) 05/17/2016 1736   APPEARANCEUR HAZY (A) 05/17/2016 1736   LABSPEC 1.012 05/17/2016 1736   PHURINE 5.0 05/17/2016 1736   GLUCOSEU NEGATIVE 05/17/2016 1736   HGBUR MODERATE (A) 05/17/2016 1736   BILIRUBINUR NEGATIVE 05/17/2016 1736   KETONESUR NEGATIVE 05/17/2016 1736   PROTEINUR  NEGATIVE 05/17/2016 1736   NITRITE NEGATIVE 05/17/2016 1736   LEUKOCYTESUR TRACE (A) 05/17/2016 1736    Recent Results (from the past 240 hour(s))  C difficile quick scan w PCR reflex     Status: Abnormal   Collection Time: 05/12/16  4:00 AM  Result Value Ref Range Status   C Diff antigen POSITIVE (A) NEGATIVE Final   C Diff toxin POSITIVE (A) NEGATIVE Final   C Diff interpretation Toxin producing C. difficile detected.  Final    Comment: RESULT CALLED TO, READ BACK BY AND VERIFIED WITH: KAYLA PRICE AT 0726 ON May 12 2016 BY [place]     Radiology Studies: Dg Chest Port 1 View  Result Date: 05/17/2016 CLINICAL DATA:  71 year old male with leukocytosis and general malaise EXAM: PORTABLE CHEST 1 VIEW COMPARISON:  Prior chest x-ray 05/12/2016 FINDINGS: Bilateral basilar hazy airspace opacities most consistent with bilateral layering pleural effusions and associated atelectasis. The cardiac and mediastinal contours are unchanged. The visualized upper abdominal bowel gas pattern is unremarkable. No acute osseous abnormality. Inspiratory volumes are low. IMPRESSION: 1. Layering right pleural effusion and associated atelectasis. 2. Nonspecific left basilar opacity may also reflect pleural fluid with atelectasis. Superimposed airspace infiltrate/pneumonia is difficult to exclude on the left. Electronically Signed   By: Jacqulynn Cadet M.D.   On: 05/17/2016 14:41   Scheduled Meds: . [START ON 05/20/2016] acyclovir  200 mg Oral Daily  . atorvastatin  20 mg Oral Daily  . enoxaparin (LOVENOX) injection  30 mg Subcutaneous Q24H  . feeding supplement (GLUCERNA SHAKE)  237 mL Oral TID BM  . insulin aspart  0-9 Units Subcutaneous TID WC  . insulin detemir  15 Units Subcutaneous QHS  . lactobacillus acidophilus  2 tablet Oral TID  . metoprolol succinate  75 mg Oral Daily  . multivitamin with minerals  1 tablet Oral Daily  . pantoprazole  40 mg Oral Daily  . sodium bicarbonate  650 mg Oral TID  .  vancomycin  500 mg Oral Q6H   Continuous Infusions: . furosemide Stopped (05/19/16 0717)  . metronidazole Stopped (05/19/16 0717)    LOS: 7 days   Time spent: 23 min  Irwin Brakeman, MD [facility] Pager [phone]  If 7PM-7AM, please contact night-coverage [email or web address] Password [facility] 05/19/2016, 12:09 PM

## 2016-05-19 NOTE — Progress Notes (Signed)
Pt voided large amount on the bed pads. The two bed pads on the bed was soak and wet.

## 2016-05-19 NOTE — Progress Notes (Signed)
05/19/2016 6:50 PM  RN texted me now that patient hasn't urinated since foley removed this morning and penis a little more swollen than it was this morning.  I am asking for bladder scan and will order in/out cath PRN and will request urology consult in AM.    Murvin Natal MD

## 2016-05-19 NOTE — Progress Notes (Signed)
Patients penis is edematous, as previously documented. Wife insistent that I page the MD. I Paged Dr Pilar Jarvis, and he returned the page right away. He informed me this was natural, it would not burst and that it will take 2-3 weeks for swelling to reduce.  Will inform the wife what the md said. He will not come to assess again. If the patient wants an icebag, that is okay, not necessary.

## 2016-05-20 ENCOUNTER — Ambulatory Visit: Payer: Medicare HMO

## 2016-05-20 ENCOUNTER — Other Ambulatory Visit: Payer: Medicare HMO

## 2016-05-20 ENCOUNTER — Inpatient Hospital Stay (HOSPITAL_COMMUNITY): Payer: Medicare HMO

## 2016-05-20 ENCOUNTER — Encounter (HOSPITAL_COMMUNITY): Payer: Self-pay | Admitting: Interventional Radiology

## 2016-05-20 HISTORY — PX: IR FLUORO GUIDE CV LINE RIGHT: IMG2283

## 2016-05-20 HISTORY — PX: IR US GUIDE VASC ACCESS RIGHT: IMG2390

## 2016-05-20 LAB — CBC WITH DIFFERENTIAL/PLATELET
Basophils Absolute: 0 10*3/uL (ref 0.0–0.1)
Basophils Relative: 0 %
EOS ABS: 0 10*3/uL (ref 0.0–0.7)
EOS PCT: 0 %
HCT: 32.6 % — ABNORMAL LOW (ref 39.0–52.0)
Hemoglobin: 11.5 g/dL — ABNORMAL LOW (ref 13.0–17.0)
LYMPHS PCT: 10 %
Lymphs Abs: 1.1 10*3/uL (ref 0.7–4.0)
MCH: 28 pg (ref 26.0–34.0)
MCHC: 35.3 g/dL (ref 30.0–36.0)
MCV: 79.3 fL (ref 78.0–100.0)
MONOS PCT: 6 %
Monocytes Absolute: 0.7 10*3/uL (ref 0.1–1.0)
NEUTROS PCT: 84 %
Neutro Abs: 9.8 10*3/uL — ABNORMAL HIGH (ref 1.7–7.7)
PLATELETS: 345 10*3/uL (ref 150–400)
RBC: 4.11 MIL/uL — AB (ref 4.22–5.81)
RDW: 19.5 % — ABNORMAL HIGH (ref 11.5–15.5)
WBC: 11.6 10*3/uL — ABNORMAL HIGH (ref 4.0–10.5)

## 2016-05-20 LAB — RENAL FUNCTION PANEL
Albumin: 1.8 g/dL — ABNORMAL LOW (ref 3.5–5.0)
Anion gap: 12 (ref 5–15)
BUN: 105 mg/dL — ABNORMAL HIGH (ref 6–20)
CALCIUM: 7.3 mg/dL — AB (ref 8.9–10.3)
CO2: 20 mmol/L — ABNORMAL LOW (ref 22–32)
Chloride: 101 mmol/L (ref 101–111)
Creatinine, Ser: 5.66 mg/dL — ABNORMAL HIGH (ref 0.61–1.24)
GFR, EST AFRICAN AMERICAN: 10 mL/min — AB (ref >60)
GFR, EST NON AFRICAN AMERICAN: 9 mL/min — AB (ref >60)
Glucose, Bld: 229 mg/dL — ABNORMAL HIGH (ref 65–99)
PHOSPHORUS: 8.3 mg/dL — AB (ref 2.5–4.6)
Potassium: 3.9 mmol/L (ref 3.5–5.1)
SODIUM: 133 mmol/L — AB (ref 135–145)

## 2016-05-20 LAB — BASIC METABOLIC PANEL
ANION GAP: 12 (ref 5–15)
BUN: 105 mg/dL — ABNORMAL HIGH (ref 6–20)
CHLORIDE: 102 mmol/L (ref 101–111)
CO2: 20 mmol/L — ABNORMAL LOW (ref 22–32)
CREATININE: 5.64 mg/dL — AB (ref 0.61–1.24)
Calcium: 7.4 mg/dL — ABNORMAL LOW (ref 8.9–10.3)
GFR calc non Af Amer: 9 mL/min — ABNORMAL LOW (ref >60)
GFR, EST AFRICAN AMERICAN: 10 mL/min — AB (ref >60)
GLUCOSE: 231 mg/dL — AB (ref 65–99)
Potassium: 4 mmol/L (ref 3.5–5.1)
Sodium: 134 mmol/L — ABNORMAL LOW (ref 135–145)

## 2016-05-20 LAB — GLUCOSE, CAPILLARY
GLUCOSE-CAPILLARY: 197 mg/dL — AB (ref 65–99)
Glucose-Capillary: 239 mg/dL — ABNORMAL HIGH (ref 65–99)
Glucose-Capillary: 203 mg/dL — ABNORMAL HIGH (ref 65–99)
Glucose-Capillary: 181 mg/dL — ABNORMAL HIGH (ref 65–99)

## 2016-05-20 LAB — PROTIME-INR
INR: 1.3
PROTHROMBIN TIME: 16.2 s — AB (ref 11.4–15.2)

## 2016-05-20 MED ORDER — SODIUM CHLORIDE 0.9% FLUSH
10.0000 mL | INTRAVENOUS | Status: DC | PRN
  Administered 2016-05-21 – 2016-05-30 (×6): 10 mL
  Filled 2016-05-20 (×6): qty 40

## 2016-05-20 MED ORDER — LIDOCAINE HCL 1 % IJ SOLN
INTRAMUSCULAR | Status: AC
  Filled 2016-05-20: qty 20

## 2016-05-20 MED ORDER — HEPARIN SODIUM (PORCINE) 1000 UNIT/ML IJ SOLN
INTRAMUSCULAR | Status: DC | PRN
  Administered 2016-05-20: 2.4 mL via INTRAVENOUS

## 2016-05-20 MED ORDER — HEPARIN SODIUM (PORCINE) 1000 UNIT/ML IJ SOLN
INTRAMUSCULAR | Status: AC
  Filled 2016-05-20: qty 1

## 2016-05-20 MED ORDER — BOOST / RESOURCE BREEZE PO LIQD
1.0000 | Freq: Three times a day (TID) | ORAL | Status: DC
  Administered 2016-05-20 – 2016-05-22 (×5): 1 via ORAL

## 2016-05-20 MED ORDER — PRO-STAT SUGAR FREE PO LIQD
30.0000 mL | Freq: Two times a day (BID) | ORAL | Status: DC
  Administered 2016-05-20 – 2016-05-31 (×18): 30 mL via ORAL
  Filled 2016-05-20 (×19): qty 30

## 2016-05-20 MED ORDER — LIDOCAINE HCL (PF) 1 % IJ SOLN
INTRAMUSCULAR | Status: DC | PRN
  Administered 2016-05-20: 5 mL

## 2016-05-20 NOTE — Progress Notes (Signed)
PROGRESS NOTE  Douglas Rose.  OXB:[id number] DOB: 1945/04/21 DOA: 05/11/2016 PCP: [name], MD  Brief Narrative:   Douglas Wachsmuth. is a 71 y.o. male with history of multiple myeloma on chemotherapy, recent history of C. Diff colitis, who was placed on clindamycin for the last 3 weeks for mandibular abscess who presented to the ER with complaints of diffuse crampy abdominal pain with multiple episodes of diarrhea.  He was found to have sepsis due to C. Diff colitis.  His course has been complicated by ongoing sinus tachycardia and AKI.   Assessment & Plan:   Principal Problem:   SIRS (systemic inflammatory response syndrome) (HCC) Active Problems:   Coronary artery disease   Hyperlipidemia   Hypertension   Multiple myeloma (HCC)   Colitis   Type 2 diabetes mellitus with vascular disease ([place])   Sinus tachycardia   AKI (acute kidney injury) ([place])   Enteritis due to Clostridium difficile  Sepsis secondary to C. Diff colitis with improving leukocytosis, but persistent tachycardia and AKI. Slowly improving.  - continue IV flagyl - continue oral vancomycin  -  IV dilaudid for pain control as needed -  Continue zofran for nausea symptoms  Sinus tachycardia -  IVF discontinued by nephrology -  Doubt that this is due to adrenal insufficiency as BP are currently stable -  Resumed home metoprolol with improvement  Diabetes mellitus type 2 with hyperglycemia  -  D/c dextrose fluids as tolerating at least some PO -  Stopped steroids after initial dose of hydrocortisone was given at admission -  Continue levemir, no prandial coverage as he is not eating well.  -  Continue moderate dose SSI CBG (last 3)   Recent Labs  05/19/16 2237 05/20/16 0815 05/20/16 1221  GLUCAP 265* 239* 203*   Acute on chronic kidney disease stage 3 due to MM and DM, likely due to dehydration and sepsis and contrast nephropathy.  Creatinine up to 5.3,  baseline of 1.5 -  RUS: normal kidneys    -  Following daily Renal function panel -  Not on ACE/ARB or NSAIDS -  Appreciate renal consult, discussed possibility of HD with patient if renal function continues to deteriorate but hopeful of recovery -  Pt with more symptoms of uremia, but UOP picking up, creatine slightly improved today.  Renal team following closely.   -  Foley discontinued 5/14 as penis was found to be swollen, placed condom cath.   Paraphimosis - I consulted urologist Dr. Pilar Jarvis to come and evaluate.  See his notes.  It has been reduced and can expect swelling to go down over next weeks.  Also HD will help with swelling.    Hypertension, blood pressures well controlled.  Following closely.    Metabolic acidosis - started on sodium bicarbonate tablets by nephrology  CAD s/p stenting in 2006, chest pain free -  Troponin negative -  Aspirin, statin, and beta blocker  Anemia and thrombocytopenia most likely from chemotherapy and MM - Has been stable, following.   Leukocytosis - ? From c diff infection, portable CXR nonspecific for pneumonia, repeat study after HD and fluid removal, no fever, no cough, no SOB. Following closely.  WBC slowly trending down.    MM, followed by [name] - see consult notes   Mandibular abscess, continue flagyl - Pt had been working on following up with an oral surgeon for definitive treatment.    Hypomagnesemia due to diarrhea -  IV magnesium repletion as  needed, follow levels.   Hyponatremia due to dehydration and hyperglycemia -  Stable, completed bicarbonate infusion per nephrologist  DVT prophylaxis: Lovenox. Code Status: Full code.  Family Communication: wife at bedside Disposition Plan: TBD  Consultants:   Nephrology  Urology  IR  Procedures:  Pending temp vas cath placement 5/15  Antimicrobials:  Anti-infectives    Start     Dose/Rate Route Frequency Ordered Stop   05/20/16 1000  acyclovir (ZOVIRAX) 200 MG capsule 200 mg     200 mg Oral Daily 05/19/16 1137      05/12/16 1000  acyclovir (ZOVIRAX) 200 MG capsule 200 mg  Status:  Discontinued     200 mg Oral 2 times daily 05/12/16 0532 05/19/16 1137   05/12/16 0900  vancomycin (VANCOCIN) 50 mg/mL oral solution 500 mg     500 mg Oral Every 6 hours 05/12/16 0748 05/26/16 0859   05/12/16 0600  metroNIDAZOLE (FLAGYL) IVPB 500 mg     500 mg 100 mL/hr over 60 Minutes Intravenous Every 8 hours 05/12/16 0532     05/12/16 0330  metroNIDAZOLE (FLAGYL) tablet 500 mg     500 mg Oral  Once 05/12/16 0327 05/12/16 0338   05/12/16 0330  ciprofloxacin (CIPRO) tablet 500 mg     500 mg Oral  Once 05/12/16 0327 05/12/16 0338      Subjective: Pt still feels terrible. He is going to start temporary HD today per renal team.     Objective: Vitals:   05/19/16 0945 05/19/16 1541 05/19/16 2244 05/20/16 0516  BP: 103/64 103/66 132/71 105/73  Pulse: 71 (!) 101 (!) 102 (!) 103  Resp:  _0 Temp:  98.6 F (37 C) 98 F (36.7 C) 98 F (36.7 C)  TempSrc:      SpO2:  99% 98% 98%  Weight:      Height:        Intake/Output Summary (Last 24 hours) at 05/20/16 1241 Last data filed at 05/19/16 1600  Gross per 24 hour  Intake             1418 ml  Output                0 ml  Net             1418 ml   Filed Weights   05/11/16 2353 05/12/16 0501  Weight: 76.2 kg (168 lb) 73.9 kg (163 lb)   Review of systems: As per history otherwise all reviewed negative  Examination:  General exam:  Adult male.  No acute distress.  HEENT:  NCAT, MMM Respiratory system: Clear to auscultation bilaterally Cardiovascular system: normal S1/S2. No murmurs, rubs, gallops or clicks.  Warm extremities Gastrointestinal system: active bowel sounds, soft, nondistended, without rebound or guarding GU: swollen penis glans with foley, (removed at bedside with RN) MSK:  Normal tone and bulk, no lower extremity edema Neuro:  Grossly intact  Data Reviewed: I have personally reviewed following labs and imaging studies  CBC:  Recent  Labs Lab 05/16/16 0446 05/17/16 0816 05/18/16 0824 05/19/16 0700 05/20/16 0624  WBC 11.0* 14.2* 14.1* 12.7* 11.6*  NEUTROABS  --   --   --   --  9.8*  HGB 11.7* 11.7* 11.5* 11.7* 11.5*  HCT 33.6* 31.9* 31.9* 32.1* 32.6*  MCV 78.7 78.0 78.2 79.3 79.3  PLT 216 238 295 323 546   Basic Metabolic Panel:  Recent Labs Lab 05/16/16 0446 05/17/16 0816 05/18/16 0824 05/19/16 0700 05/20/16 5681  NA 132* 132* 133* 134* 133*  134*  K 4.2 3.8 3.9 4.2 3.9  4.0  CL 103 102 101 100* 101  102  CO2 18* 19* 18* 20* 20*  20*  GLUCOSE 166* 154* 119* 170* 229*  231*  BUN 70* 81* 89* 98* 105*  105*  CREATININE 5.37* 5.68* 6.68* 5.92* 5.66*  5.64*  CALCIUM 7.4* 7.2* 7.2* 7.2* 7.3*  7.4*  PHOS 6.8* 7.1* 7.5* 8.7* 8.3*   GFR: Estimated Creatinine Clearance: 12.3 mL/min (A) (by C-G formula based on SCr of 5.66 mg/dL (H)). Liver Function Tests:  Recent Labs Lab 05/16/16 0446 05/17/16 0816 05/18/16 0824 05/19/16 0700 05/20/16 0624  ALBUMIN 1.8* 1.8* 1.7* 1.8* 1.8*   No results for input(s): LIPASE, AMYLASE in the last 168 hours. No results for input(s): AMMONIA in the last 168 hours. Coagulation Profile:  Recent Labs Lab 05/20/16 0624  INR 1.30   Cardiac Enzymes: No results for input(s): CKTOTAL, CKMB, CKMBINDEX, TROPONINI in the last 168 hours. BNP (last 3 results) No results for input(s): PROBNP in the last 8760 hours. HbA1C: No results for input(s): HGBA1C in the last 72 hours. CBG:  Recent Labs Lab 05/19/16 1245 05/19/16 1709 05/19/16 2237 05/20/16 0815 05/20/16 1221  GLUCAP 210* 233* 265* 239* 203*   Lipid Profile: No results for input(s): CHOL, HDL, LDLCALC, TRIG, CHOLHDL, LDLDIRECT in the last 72 hours. Thyroid Function Tests: No results for input(s): TSH, T4TOTAL, FREET4, T3FREE, THYROIDAB in the last 72 hours. Anemia Panel: No results for input(s): VITAMINB12, FOLATE, FERRITIN, TIBC, IRON, RETICCTPCT in the last 72 hours. Urine analysis:    Component  Value Date/Time   COLORURINE AMBER (A) 05/17/2016 1736   APPEARANCEUR HAZY (A) 05/17/2016 1736   LABSPEC 1.012 05/17/2016 1736   PHURINE 5.0 05/17/2016 1736   GLUCOSEU NEGATIVE 05/17/2016 1736   HGBUR MODERATE (A) 05/17/2016 1736   BILIRUBINUR NEGATIVE 05/17/2016 1736   KETONESUR NEGATIVE 05/17/2016 1736   PROTEINUR NEGATIVE 05/17/2016 1736   NITRITE NEGATIVE 05/17/2016 1736   LEUKOCYTESUR TRACE (A) 05/17/2016 1736    Recent Results (from the past 240 hour(s))  C difficile quick scan w PCR reflex     Status: Abnormal   Collection Time: 05/12/16  4:00 AM  Result Value Ref Range Status   C Diff antigen POSITIVE (A) NEGATIVE Final   C Diff toxin POSITIVE (A) NEGATIVE Final   C Diff interpretation Toxin producing C. difficile detected.  Final    Comment: RESULT CALLED TO, READ BACK BY AND VERIFIED WITH: KAYLA PRICE AT [place] ON May 12 2016 BY [facility]     Radiology Studies: No results found. Scheduled Meds: . acyclovir  200 mg Oral Daily  . atorvastatin  20 mg Oral Daily  . enoxaparin (LOVENOX) injection  30 mg Subcutaneous Q24H  . feeding supplement  1 Container Oral TID BM  . feeding supplement (PRO-STAT SUGAR FREE 64)  30 mL Oral BID  . insulin aspart  0-9 Units Subcutaneous TID WC  . insulin detemir  15 Units Subcutaneous QHS  . lactobacillus acidophilus  2 tablet Oral TID  . metoprolol succinate  75 mg Oral Daily  . pantoprazole  40 mg Oral Daily  . sodium bicarbonate  650 mg Oral TID  . vancomycin  500 mg Oral Q6H   Continuous Infusions: . metronidazole 500 mg (05/19/16 2210)    LOS: 8 days   Time spent: 21 min  Irwin Brakeman, MD [facility] Pager [phone]  If 7PM-7AM, please contact night-coverage  [email or web address] Password [facility] 05/20/2016, 12:41 PM

## 2016-05-20 NOTE — Progress Notes (Signed)
Subjective:  Feels and looks very weak... UOP not recorded but he says he is passing with some difficulty- foley taken out due to penile swelling, which I think is more of a volume overload issue- creatinine stable, but BUN up again. He still feels miserable  Objective Vital signs in last 24 hours: Vitals:   05/19/16 0945 05/19/16 1541 05/19/16 2244 05/20/16 0516  BP: 103/64 103/66 132/71 105/73  Pulse: 71 (!) 101 (!) 102 (!) 103  Resp:  18 18 18   Temp:  98.6 F (37 C) 98 F (36.7 C) 98 F (36.7 C)  TempSrc:      SpO2:  99% 98% 98%  Weight:      Height:       Weight change:   Intake/Output Summary (Last 24 hours) at 05/20/16 0843 Last data filed at 05/19/16 1600  Gross per 24 hour  Intake             1418 ml  Output                0 ml  Net             1418 ml    Assessment/ Plan: Pt is a 71 y.o. yo male with baseline MM and creatinine in the mid 1's.  who was admitted on 05/11/2016 with C diff colitis- had contrasted CT.  Course complicated by AKI  Assessment/Plan: 1. Renal- creatinine 5/5 was 1.67 but has worsened basically every day he has been in house.  I was hoping yesterday he was showing some signs of improvement but unfortunately it has not continued- Today, BUN did go up again and patient seems clinically uremic.  It is difficult for me to tell if he is miserable from C diff or uremia but will have him undergo HD today and tomorrow to correct the uremia and also the volume overload. IR to place vascath. Given baseline crt good would hope only temporary and not permanent.  I also have decreased valcyte to more appropriate dose based on renal function. He is voiding without foley, hopefully as we get volume off this will improve as swelling will come down.  2. HTN/vol- overloaded- BP marginal  - on lopressor for tachycardia will stop lasix as is starting HD 3. Anemia- due to CKD and multiple myeloma- hgb over 11- supportive care  4. Metabolic acidosis- on oral bicarb- will  correct further with HD 5.C Diff- still symptomatic- on oral vanc and flagyl      Verneal Wiers A    Labs: Basic Metabolic Panel:  Recent Labs Lab 05/18/16 0824 05/19/16 0700 05/20/16 0624  NA 133* 134* 133*  134*  K 3.9 4.2 3.9  4.0  CL 101 100* 101  102  CO2 18* 20* 20*  20*  GLUCOSE 119* 170* 229*  231*  BUN 89* 98* 105*  105*  CREATININE 6.68* 5.92* 5.66*  5.64*  CALCIUM 7.2* 7.2* 7.3*  7.4*  PHOS 7.5* 8.7* 8.3*   Liver Function Tests:  Recent Labs Lab 05/18/16 0824 05/19/16 0700 05/20/16 0624  ALBUMIN 1.7* 1.8* 1.8*   No results for input(s): LIPASE, AMYLASE in the last 168 hours. No results for input(s): AMMONIA in the last 168 hours. CBC:  Recent Labs Lab 05/16/16 0446 05/17/16 0816 05/18/16 0824 05/19/16 0700 05/20/16 0624  WBC 11.0* 14.2* 14.1* 12.7* 11.6*  NEUTROABS  --   --   --   --  9.8*  HGB 11.7* 11.7* 11.5* 11.7* 11.5*  HCT 33.6*  31.9* 31.9* 32.1* 32.6*  MCV 78.7 78.0 78.2 79.3 79.3  PLT 216 238 295 323 345   Cardiac Enzymes: No results for input(s): CKTOTAL, CKMB, CKMBINDEX, TROPONINI in the last 168 hours. CBG:  Recent Labs Lab 05/19/16 0811 05/19/16 1245 05/19/16 1709 05/19/16 2237 05/20/16 0815  GLUCAP 168* 210* 233* 265* 239*    Iron Studies: No results for input(s): IRON, TIBC, TRANSFERRIN, FERRITIN in the last 72 hours. Studies/Results: No results found. Medications: Infusions: . furosemide 160 mg (05/20/16 0656)  . metronidazole 500 mg (05/19/16 2210)    Scheduled Medications: . acyclovir  200 mg Oral Daily  . atorvastatin  20 mg Oral Daily  . enoxaparin (LOVENOX) injection  30 mg Subcutaneous Q24H  . feeding supplement (GLUCERNA SHAKE)  237 mL Oral TID BM  . insulin aspart  0-9 Units Subcutaneous TID WC  . insulin detemir  15 Units Subcutaneous QHS  . lactobacillus acidophilus  2 tablet Oral TID  . metoprolol succinate  75 mg Oral Daily  . multivitamin with minerals  1 tablet Oral Daily  .  pantoprazole  40 mg Oral Daily  . sodium bicarbonate  650 mg Oral TID  . vancomycin  500 mg Oral Q6H    have reviewed scheduled and prn medications.  Physical Exam: General:very weak- cannot move head- says feels terrible- nauseated and weak Heart: tachy Lungs: mostly clear Abdomen: soft, non tender Extremities: pitting edema throughout including scrotum    05/20/2016,8:43 AM  LOS: 8 days

## 2016-05-20 NOTE — Care Management Important Message (Signed)
Important Message  Patient Details  Name: Douglas Rose. MRN: [id number] Date of Birth: [birth year]   Medicare Important Message Given:  Yes    Douglas Rose May 05/20/2016, 11:35 AM

## 2016-05-20 NOTE — Progress Notes (Addendum)
Nutrition Follow-up  DOCUMENTATION CODES:   Not applicable  INTERVENTION:   -D/c Glucerna Shake po TID, each supplement provides 220 kcal and 10 grams of protein, secondary to poor acceptance -Boost Breeze po TID, each supplement provides 250 kcal and 9 grams of protein -30 ml Prostat BID, each supplement provides 100 kcals and 15 grams protein  NUTRITION DIAGNOSIS:   Inadequate oral intake related to poor appetite, vomiting, nausea as evidenced by meal completion < 25%.  Ongoing  GOAL:   Patient will meet greater than or equal to 90% of their needs  Unmet  MONITOR:   PO intake, Supplement acceptance, Labs, Weight trends, Skin, I & O's  REASON FOR ASSESSMENT:   Malnutrition Screening Tool    ASSESSMENT:   Douglas Rose. is a 71 y.o. male with history of multiple myeloma recently diagnosed and chemotherapy last chemotherapy 3 days ago and recently placed on clindamycin for the last 3 weeks for mandibular abscess presents to the ER with complaints of diffuse crampy abdominal pain with multiple episodes of diarrhea. Patient had one episode of nausea and vomiting. Denies any fever or chills. Over the last 4 weeks patient has been on chemotherapy for multiple myeloma. Patient states over the last 24 hours patient also had some exertional chest pressure. No chest pain on resting.   Spoke with pt and wife at bedside. Pt wife reports PTA that appetite was normal- pt typically consumes 1-2 meals daily at baseline. However, intake has been almost minimal during hospitalization. Per pt wife, intake has consisted mainly of jello and a few sips of liquids. Pt shares that the smell of foods often trigger nausea, which is a large barrier to adequate oral intake. Pt also does not like the Glucerna supplements and has been refusing them- noted 3 unopened bottles on tray table at bedside.   Pt denies any weight loss PTA, however, suspects that he has lost weight due to poor oral intake. Pt  slightly edematous on exam, which may be masking true weight loss.   Nutrition-Focused physical exam completed. Findings are no fat depletion, no muscle depletion, and mild edema.   Reviewed nephrology notes; plan to place HD cath today and undergo HD today and tomorrow to correct uremia. Pt and wife are hopeful that HD will help improve symptoms.   Discussed importance of adequate nutrition to support healing. Pt amenable to try Boost Breeze, despite high sugar content, due to clear liquid and renal friendliness. Will also order Prostat, due to small volume.   Noted high CBGS, but likely not contributing from po intake. Inpatient DM regimen includes sliding scale insulin aspart TID and insulin detemir 15 units daily.  Labs reviewed: Na: 133, CBGS: 233-265, Phos: 8.3.   Diet Order:  Diet NPO time specified  Skin:  Reviewed, no issues  Last BM:  05/19/16  Height:   Ht Readings from Last 1 Encounters:  05/12/16 5' 11"  (1.803 m)    Weight:   Wt Readings from Last 1 Encounters:  05/12/16 163 lb (73.9 kg)    Ideal Body Weight:  78.2 kg  BMI:  Body mass index is 22.73 kg/m.  Estimated Nutritional Needs:   Kcal:  1800-2000  Protein:  90-105 grams  Fluid:  1.8-2.0 L  EDUCATION NEEDS:   Education needs addressed  Jerah Esty A. Jimmye Norman, RD, LDN, CDE Pager: [phone] After hours Pager: [phone]

## 2016-05-20 NOTE — Progress Notes (Signed)
PT Cancellation Note  Patient Details Name: Douglas Rose. MRN: [id number] DOB: 23-Oct-1945   Cancelled Treatment:    Reason Eval/Treat Not Completed: Fatigue/lethargy limiting ability to participate (pt stated he's feeling too poorly to do bed exercises or any other activity at present. Will follow. )   Philomena Doheny 05/20/2016, 1:22 PM [phone]

## 2016-05-21 DIAGNOSIS — A419 Sepsis, unspecified organism: Secondary | ICD-10-CM

## 2016-05-21 DIAGNOSIS — N183 Chronic kidney disease, stage 3 (moderate): Secondary | ICD-10-CM

## 2016-05-21 LAB — RENAL FUNCTION PANEL
ANION GAP: 10 (ref 5–15)
Albumin: 1.7 g/dL — ABNORMAL LOW (ref 3.5–5.0)
BUN: 103 mg/dL — AB (ref 6–20)
CALCIUM: 7.3 mg/dL — AB (ref 8.9–10.3)
CO2: 22 mmol/L (ref 22–32)
Chloride: 104 mmol/L (ref 101–111)
Creatinine, Ser: 4.98 mg/dL — ABNORMAL HIGH (ref 0.61–1.24)
GFR calc Af Amer: 12 mL/min — ABNORMAL LOW (ref >60)
GFR calc non Af Amer: 10 mL/min — ABNORMAL LOW (ref >60)
GLUCOSE: 184 mg/dL — AB (ref 65–99)
PHOSPHORUS: 7.5 mg/dL — AB (ref 2.5–4.6)
Potassium: 3.3 mmol/L — ABNORMAL LOW (ref 3.5–5.1)
Sodium: 136 mmol/L (ref 135–145)

## 2016-05-21 LAB — CBC
HEMATOCRIT: 30.9 % — AB (ref 39.0–52.0)
Hemoglobin: 10.9 g/dL — ABNORMAL LOW (ref 13.0–17.0)
MCH: 27.9 pg (ref 26.0–34.0)
MCHC: 35.3 g/dL (ref 30.0–36.0)
MCV: 79 fL (ref 78.0–100.0)
Platelets: 350 10*3/uL (ref 150–400)
RBC: 3.91 MIL/uL — AB (ref 4.22–5.81)
RDW: 19.8 % — ABNORMAL HIGH (ref 11.5–15.5)
WBC: 10.5 10*3/uL (ref 4.0–10.5)

## 2016-05-21 LAB — GLUCOSE, CAPILLARY
GLUCOSE-CAPILLARY: 149 mg/dL — AB (ref 65–99)
Glucose-Capillary: 280 mg/dL — ABNORMAL HIGH (ref 65–99)
Glucose-Capillary: 237 mg/dL — ABNORMAL HIGH (ref 65–99)

## 2016-05-21 LAB — HEPATITIS B SURFACE ANTIGEN: Hepatitis B Surface Ag: NEGATIVE

## 2016-05-21 MED ORDER — FAMOTIDINE 20 MG PO TABS
20.0000 mg | ORAL_TABLET | Freq: Every day | ORAL | Status: DC
  Administered 2016-05-21 – 2016-05-30 (×10): 20 mg via ORAL
  Filled 2016-05-21 (×10): qty 1

## 2016-05-21 MED ORDER — SODIUM CHLORIDE 0.9 % IV SOLN: 100.0000 mL | INTRAVENOUS | Status: DC | PRN

## 2016-05-21 MED ORDER — PENTAFLUOROPROP-TETRAFLUOROETH EX AERO: 1.0000 "application " | INHALATION_SPRAY | CUTANEOUS | Status: DC | PRN

## 2016-05-21 MED ORDER — NEPRO/CARBSTEADY PO LIQD
237.0000 mL | Freq: Two times a day (BID) | ORAL | Status: DC
  Administered 2016-05-22 – 2016-05-31 (×11): 237 mL via ORAL
  Filled 2016-05-21 (×2): qty 237

## 2016-05-21 MED ORDER — HEPARIN SODIUM (PORCINE) 5000 UNIT/ML IJ SOLN
5000.0000 [IU] | Freq: Three times a day (TID) | INTRAMUSCULAR | Status: DC
  Administered 2016-05-21 – 2016-06-01 (×30): 5000 [IU] via SUBCUTANEOUS
  Filled 2016-05-21 (×32): qty 1

## 2016-05-21 MED ORDER — LIDOCAINE-PRILOCAINE 2.5-2.5 % EX CREA: 1.0000 "application " | TOPICAL_CREAM | CUTANEOUS | Status: DC | PRN

## 2016-05-21 MED ORDER — ALTEPLASE 2 MG IJ SOLR: 2.0000 mg | Freq: Once | INTRAMUSCULAR | Status: DC | PRN

## 2016-05-21 MED ORDER — HEPARIN SODIUM (PORCINE) 1000 UNIT/ML DIALYSIS: 1000.0000 [IU] | INTRAMUSCULAR | Status: DC | PRN

## 2016-05-21 MED ORDER — LIDOCAINE HCL (PF) 1 % IJ SOLN: 5.0000 mL | INTRAMUSCULAR | Status: DC | PRN

## 2016-05-21 NOTE — Progress Notes (Signed)
Left voicemail for patient's wife to contact CSW.   Douglas Rose LCSWA [phone]

## 2016-05-21 NOTE — Progress Notes (Signed)
[facility] PROGRESS NOTE  Douglas Rose. JME:[id number] DOB: 1945-10-02 DOA: 05/11/2016 PCP: [name], MD  Interim summary and history of present illness 71 year old male with history of multiple myeloma on chemotherapy and recent mandibular abscess which was treated with clindamycin; presented to the emergency department secondary to crampy abdominal pain and multiple episode of diarrhea. Patient was found to have sepsis due to C. difficile colitis. His course has been now complicated with acute on chronic renal failure and oliguria requiring hemodialysis.  Assessment/Plan: 1-sepsis secondary to C. difficile colitis -Slowly improving -Sepsis features resolved -Patient is now afebrile, with normal WBCs and improvement in the amount of loose stool episodes -Still tachycardic -Will continue vancomycin and Flagyl -Continue supportive care and as needed antiemetics. -PPI has been discontinue while patient received treatment for C. 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  -Up for nephrotoxic agents (ACE/ARB/NSAIDs).  -Renal ultrasound has rule out hydronephrosis and obstructive uropathy. -will d/c lovenox and use heparin instead.  5-paraphimosis -Urology has been consulted at this  particular moment Foley catheter has been removed and phimosis essentially reduce manually. -Anticipating improvement in this condition as his swelling decreases now that hemodialysis has been initiated  6-hypertension -Blood pressure overall stable and well controlled. -Lasix has been discontinue as he had not been started on hemodialysis -Continue beta blocker (with many intention of his metoprolol to be used for tachycardia control).  7-metabolic acidosis: Secondary to renal failure -Continue sodium bicarbonate -Anticipating further improvement now that he is on hemodialysis  8-hypoalbuminemia with severe protein calorie malnutrition -Started on Nepro  9-anemia/thrombocytopenia: Due to chronic kidney disease and chemotherapy given for his multiple myeloma. -Overall stable and not requiring transfusion currently -Will monitor training intermittently  10-hypomagnesemia -will replete as needed -most likely associated with diarrhea   11-MM  -continue outpatient follow-up with oncology service -Continue Zovirax   Code Status: Full code Family Communication: Wife at bedside Disposition Plan: To be determined. For now remains as inpatient and will follow response and need for further hemodialysis therapy; continue;    Consultants:  Nephrology  Urology  IR   Procedures:  See below for x-ray reports  Hemodialysis 5/16  Antibiotics:  Vancomycin  05/12/16  Metronidazole  05/13/16  Zovirax   HPI/Subjective: Patient is feeling nauseated, weak, and very decondition. Endorses very poor appetite. No chest pain and is afebrile. Per family there has been decrease in the amount of loose stools episodes.  Objective: Vitals:   05/21/16 1105 05/21/16 1610  BP: (!) 79/56 95/64  Pulse: (!) 114 (!) 117  Resp:  20  Temp:  97.7 F (36.5 C)    Intake/Output Summary (Last 24 hours) at 05/21/16 1805 Last data filed at 05/21/16 0955  Gross per 24 hour  Intake               10 ml  Output              700 ml  Net             -690 ml   Filed Weights   05/12/16 0501 05/21/16 0700 05/21/16 0955  Weight: 73.9 kg (163 lb) 90 kg (198 lb 6.6 oz) 88.8 kg (195 lb 12.3 oz)    Exam:   General: Sleepy, tired and chronically ill/deconditioned in appearance. Patient denies chest pain. Fluid overload on exam. Reports some improvement in urine output. Denies nausea/vomiting. Very poor oral intake.  Cardiovascular: S1 and S2, tachycardic, (sinus rhythm per telemetry evaluation), no rubs, no gallops  Respiratory: No using accessory muscles, no wheezing, decreased breath sounds at bases bilaterally, no frank crackles  Abdomen: Soft, nontender, nondistended, positive bowel sounds  Musculoskeletal: 2-3++ edema bilaterally all the way to his waist (including scrotal area); no open wounds, no cyanosis or clubbing.   Data Reviewed: Basic Metabolic Panel:  Recent Labs Lab 05/17/16 0816 05/18/16 0824 05/19/16 0700 05/20/16 0624 05/21/16 0800  NA 132* 133* 134* 133*  134* 136  K 3.8 3.9 4.2 3.9  4.0 3.3*  CL 102 101 100* 101  102 104  CO2 19* 18* 20* 20*  20* 22  GLUCOSE 154* 119* 170* 229*  231* 184*  BUN 81* 89* 98* 105*  105* 103*  CREATININE 5.68* 6.68* 5.92* 5.66*  5.64* 4.98*  CALCIUM 7.2* 7.2* 7.2* 7.3*  7.4* 7.3*  PHOS 7.1* 7.5* 8.7* 8.3* 7.5*   Liver Function Tests:  Recent Labs Lab 05/17/16 0816 05/18/16 0824 05/19/16 0700 05/20/16 0624 05/21/16 0800  ALBUMIN 1.8* 1.7* 1.8* 1.8* 1.7*   CBC:  Recent Labs Lab 05/17/16 0816 05/18/16 0824 05/19/16 0700 05/20/16 0624 05/21/16 0800  WBC 14.2* 14.1* 12.7* 11.6* 10.5  NEUTROABS  --   --   --  9.8*  --   HGB 11.7* 11.5* 11.7* 11.5* 10.9*  HCT 31.9* 31.9* 32.1* 32.6* 30.9*  MCV 78.0 78.2 79.3 79.3 79.0  PLT 238 295 323 345 350   CBG:  Recent Labs Lab 05/20/16 1221 05/20/16 1722 05/20/16 2120 05/21/16 1133 05/21/16 1727  GLUCAP 203* 197* 181* 149* 280*    Recent Results (from the  past 240 hour(s))  C difficile quick scan w PCR reflex     Status: Abnormal   Collection Time: 05/12/16  4:00 AM  Result Value Ref Range Status   C Diff antigen POSITIVE (A) NEGATIVE Final   C Diff toxin POSITIVE (A) NEGATIVE Final   C Diff interpretation Toxin producing C. difficile detected.  Final    Comment: RESULT CALLED TO, READ BACK BY AND VERIFIED WITH: KAYLA PRICE AT 0726 ON May 12 2016 BY SHEILA WOOD      Studies: Ir Fluoro Guide Cv Line Right  Result Date: 05/20/2016 CLINICAL DATA:  Renal failure and need for temporary hemodialysis catheter. EXAM: NON-TUNNELED CENTRAL VENOUS CATHETER PLACEMENT WITH ULTRASOUND AND FLUOROSCOPIC GUIDANCE FLUOROSCOPY TIME:  Less than 6 seconds. PROCEDURE: The procedure, risks, benefits, and alternatives were explained to the patient. Questions regarding the procedure were encouraged and answered. The patient understands and consents to the procedure. The right neck and chest were prepped with chlorhexidine in a sterile fashion, and a sterile drape was applied covering the operative field. Maximum barrier sterile technique with sterile gowns and gloves were used for the procedure. Local anesthesia was provided with 1% lidocaine. After creating a small venotomy incision, a 21 gauge needle was advanced into the right internal  jugular vein under direct, real-time ultrasound guidance. Ultrasound image documentation was performed. After securing guidewire access, the venotomy was dilated. A 13 French, 20 cm non tunneled dialysis catheter was then advanced over the wire. Final catheter positioning was confirmed and documented with a fluoroscopic spot image. The catheter was aspirated, flushed with saline, and injected with appropriate volume heparin dwells. The catheter exit site was secured with 0-Prolene retention sutures. COMPLICATIONS: None.  No pneumothorax. FINDINGS: After catheter placement, the tip lies at the cavoatrial junction. The catheter aspirates  normally and is ready for immediate use. IMPRESSION: Placement of non-tunneled central venous catheter via the right internal jugular vein. The catheter tip lies at the cavoatrial junction. The catheter is ready for immediate use. Electronically Signed   By: Glenn  Yamagata M.D.   On: 05/20/2016 16:49   Ir Us Guide Vasc Access Right  Result Date: 05/20/2016 CLINICAL DATA:  Renal failure and need for temporary hemodialysis catheter. EXAM: NON-TUNNELED CENTRAL VENOUS CATHETER PLACEMENT WITH ULTRASOUND AND FLUOROSCOPIC GUIDANCE FLUOROSCOPY TIME:  Less than 6 seconds. PROCEDURE: The procedure, risks, benefits, and alternatives were explained to the patient. Questions regarding the procedure were encouraged and answered. The patient understands and consents to the procedure. The right neck and chest were prepped with chlorhexidine in a sterile fashion, and a sterile drape was applied covering the operative field. Maximum barrier sterile technique with sterile gowns and gloves were used for the procedure. Local anesthesia was provided with 1% lidocaine. After creating a small venotomy incision, a 21 gauge needle was advanced into the right internal jugular vein under direct, real-time ultrasound guidance. Ultrasound image documentation was performed. After securing guidewire access, the venotomy was dilated. A 13 French, 20 cm non tunneled dialysis catheter was then advanced over the wire. Final catheter positioning was confirmed and documented with a fluoroscopic spot image. The catheter was aspirated, flushed with saline, and injected with appropriate volume heparin dwells. The catheter exit site was secured with 0-Prolene retention sutures. COMPLICATIONS: None.  No pneumothorax. FINDINGS: After catheter placement, the tip lies at the cavoatrial junction. The catheter aspirates normally and is ready for immediate use. IMPRESSION: Placement of non-tunneled central venous catheter via the right internal jugular vein.  The catheter tip lies at the cavoatrial junction. The catheter is ready for immediate use. Electronically Signed   By: Glenn  Yamagata M.D.   On: 05/20/2016 16:49    Scheduled Meds: . acyclovir  200 mg Oral Daily  . atorvastatin  20 mg Oral Daily  . enoxaparin (LOVENOX) injection  30 mg Subcutaneous Q24H  . feeding supplement  1 Container Oral TID BM  . feeding supplement (PRO-STAT SUGAR FREE 64)  30 mL Oral BID  . insulin aspart  0-9 Units Subcutaneous TID WC  . insulin detemir  15 Units Subcutaneous QHS  . lactobacillus acidophilus  2 tablet Oral TID  . metoprolol succinate  75 mg Oral Daily  . pantoprazole  40 mg Oral Daily  . sodium bicarbonate  650 mg Oral TID  . vancomycin  500 mg Oral Q6H   Continuous Infusions: . metronidazole Stopped (05/21/16 1544)    Principal Problem:   SIRS (systemic inflammatory response syndrome) (HCC) Active Problems:   Coronary artery disease   Hyperlipidemia   Hypertension   Multiple myeloma (HCC)   Colitis   Type 2 diabetes mellitus with vascular disease (HCC)   Sinus tachycardia   AKI (acute kidney injury) (HCC)   Enteritis due to Clostridium difficile    Time spent:   25 minutes    ,   [facility] Pager [phone]. If 7PM-7AM, please contact night-coverage at [email or web address], password [email or web address] 05/21/2016, 6:05 PM  LOS: 9 days             

## 2016-05-21 NOTE — Progress Notes (Signed)
Subjective:  Feels and looks very weak... UOP not recorded but he says he is passing a reasonable amount with some difficulty due to penile swelling- did not get vascath until late lat night so first HD was put off for this AM-   Objective Vital signs in last 24 hours: Vitals:   05/20/16 2122 05/21/16 0634 05/21/16 0700 05/21/16 0735  BP: 105/69 135/74  (!) 98/48  Pulse: 96 (!) 102  98  Resp: 18 20  20   Temp: 97.8 F (36.6 C) 98 F (36.7 C)  98 F (36.7 C)  TempSrc: Oral Oral  Oral  SpO2: 100% 95%  96%  Weight:   90 kg (198 lb 6.6 oz)   Height:       Weight change:   Intake/Output Summary (Last 24 hours) at 05/21/16 0851 Last data filed at 05/21/16 0557  Gross per 24 hour  Intake               10 ml  Output                0 ml  Net               10 ml    Assessment/ Plan: Pt is a 71 y.o. yo male with baseline MM and creatinine in the mid 1's.  who was admitted on 05/11/2016 with C diff colitis- had contrasted CT.  Course complicated by AKI  Assessment/Plan: 1. Renal- creatinine 5/5 was 1.67 but has worsened basically every day he has been in house.  I was hoping he was showing some signs of improvement but unfortunately it has not continued- Today, BUN stable and crt down but patient seems clinically uremic.  It is difficult for me to tell if he is miserable from C diff or uremia but will have him undergo HD today and tomorrow to correct the uremia and also the volume overload. IR placed vascath 5/15. Given baseline crt good would hope only temporary and not permanent.  I also have decreased valcyte to more appropriate dose based on renal function. He is voiding without foley, hopefully as we get volume off this will improve as his penile swelling will come down.  2. HTN/vol- overloaded- BP marginal  - on lopressor for tachycardia will stop lasix as is starting HD- third spacing due to low albumin  3. Anemia- due to CKD and multiple myeloma- hgb 10.9- supportive care  4. Metabolic  acidosis- on oral bicarb- will correct further with HD 5.C Diff- still symptomatic- on oral vanc and flagyl      Koben Daman A    Labs: Basic Metabolic Panel:  Recent Labs Lab 05/19/16 0700 05/20/16 0624 05/21/16 0800  NA 134* 133*  134* 136  K 4.2 3.9  4.0 3.3*  CL 100* 101  102 104  CO2 20* 20*  20* 22  GLUCOSE 170* 229*  231* 184*  BUN 98* 105*  105* 103*  CREATININE 5.92* 5.66*  5.64* 4.98*  CALCIUM 7.2* 7.3*  7.4* 7.3*  PHOS 8.7* 8.3* 7.5*   Liver Function Tests:  Recent Labs Lab 05/19/16 0700 05/20/16 0624 05/21/16 0800  ALBUMIN 1.8* 1.8* 1.7*   No results for input(s): LIPASE, AMYLASE in the last 168 hours. No results for input(s): AMMONIA in the last 168 hours. CBC:  Recent Labs Lab 05/17/16 0816 05/18/16 0824 05/19/16 0700 05/20/16 0624 05/21/16 0800  WBC 14.2* 14.1* 12.7* 11.6* 10.5  NEUTROABS  --   --   --  9.8*  --  HGB 11.7* 11.5* 11.7* 11.5* 10.9*  HCT 31.9* 31.9* 32.1* 32.6* 30.9*  MCV 78.0 78.2 79.3 79.3 79.0  PLT 238 295 323 345 350   Cardiac Enzymes: No results for input(s): CKTOTAL, CKMB, CKMBINDEX, TROPONINI in the last 168 hours. CBG:  Recent Labs Lab 05/19/16 2237 05/20/16 0815 05/20/16 1221 05/20/16 1722 05/20/16 2120  GLUCAP 265* 239* 203* 197* 181*    Iron Studies: No results for input(s): IRON, TIBC, TRANSFERRIN, FERRITIN in the last 72 hours. Studies/Results: Ir Fluoro Guide Cv Line Right  Result Date: 05/20/2016 CLINICAL DATA:  Renal failure and need for temporary hemodialysis catheter. EXAM: NON-TUNNELED CENTRAL VENOUS CATHETER PLACEMENT WITH ULTRASOUND AND FLUOROSCOPIC GUIDANCE FLUOROSCOPY TIME:  Less than 6 seconds. PROCEDURE: The procedure, risks, benefits, and alternatives were explained to the patient. Questions regarding the procedure were encouraged and answered. The patient understands and consents to the procedure. The right neck and chest were prepped with chlorhexidine in a sterile  fashion, and a sterile drape was applied covering the operative field. Maximum barrier sterile technique with sterile gowns and gloves were used for the procedure. Local anesthesia was provided with 1% lidocaine. After creating a small venotomy incision, a 21 gauge needle was advanced into the right internal jugular vein under direct, real-time ultrasound guidance. Ultrasound image documentation was performed. After securing guidewire access, the venotomy was dilated. A 13 French, 20 cm non tunneled dialysis catheter was then advanced over the wire. Final catheter positioning was confirmed and documented with a fluoroscopic spot image. The catheter was aspirated, flushed with saline, and injected with appropriate volume heparin dwells. The catheter exit site was secured with 0-Prolene retention sutures. COMPLICATIONS: None.  No pneumothorax. FINDINGS: After catheter placement, the tip lies at the cavoatrial junction. The catheter aspirates normally and is ready for immediate use. IMPRESSION: Placement of non-tunneled central venous catheter via the right internal jugular vein. The catheter tip lies at the cavoatrial junction. The catheter is ready for immediate use. Electronically Signed   By: Aletta Edouard M.D.   On: 05/20/2016 16:49   Ir US Guide Vasc Access Right  Result Date: 05/20/2016 CLINICAL DATA:  Renal failure and need for temporary hemodialysis catheter. EXAM: NON-TUNNELED CENTRAL VENOUS CATHETER PLACEMENT WITH ULTRASOUND AND FLUOROSCOPIC GUIDANCE FLUOROSCOPY TIME:  Less than 6 seconds. PROCEDURE: The procedure, risks, benefits, and alternatives were explained to the patient. Questions regarding the procedure were encouraged and answered. The patient understands and consents to the procedure. The right neck and chest were prepped with chlorhexidine in a sterile fashion, and a sterile drape was applied covering the operative field. Maximum barrier sterile technique with sterile gowns and gloves were  used for the procedure. Local anesthesia was provided with 1% lidocaine. After creating a small venotomy incision, a 21 gauge needle was advanced into the right internal jugular vein under direct, real-time ultrasound guidance. Ultrasound image documentation was performed. After securing guidewire access, the venotomy was dilated. A 13 French, 20 cm non tunneled dialysis catheter was then advanced over the wire. Final catheter positioning was confirmed and documented with a fluoroscopic spot image. The catheter was aspirated, flushed with saline, and injected with appropriate volume heparin dwells. The catheter exit site was secured with 0-Prolene retention sutures. COMPLICATIONS: None.  No pneumothorax. FINDINGS: After catheter placement, the tip lies at the cavoatrial junction. The catheter aspirates normally and is ready for immediate use. IMPRESSION: Placement of non-tunneled central venous catheter via the right internal jugular vein. The catheter tip lies at the cavoatrial junction.  The catheter is ready for immediate use. Electronically Signed   By: Aletta Edouard M.D.   On: 05/20/2016 16:49   Medications: Infusions: . sodium chloride    . sodium chloride    . metronidazole 500 mg (05/21/16 9570)    Scheduled Medications: . acyclovir  200 mg Oral Daily  . atorvastatin  20 mg Oral Daily  . enoxaparin (LOVENOX) injection  30 mg Subcutaneous Q24H  . feeding supplement  1 Container Oral TID BM  . feeding supplement (PRO-STAT SUGAR FREE 64)  30 mL Oral BID  . insulin aspart  0-9 Units Subcutaneous TID WC  . insulin detemir  15 Units Subcutaneous QHS  . lactobacillus acidophilus  2 tablet Oral TID  . metoprolol succinate  75 mg Oral Daily  . pantoprazole  40 mg Oral Daily  . sodium bicarbonate  650 mg Oral TID  . vancomycin  500 mg Oral Q6H    have reviewed scheduled and prn medications.  Physical Exam: General:very weak- cannot move head- says feels terrible- nauseated and weak Heart:  tachy Lungs: mostly clear Abdomen: soft, non tender Extremities: pitting edema throughout including scrotum    05/21/2016,8:51 AM  LOS: 9 days

## 2016-05-21 NOTE — Procedures (Signed)
Patient was seen on dialysis and the procedure was supervised.  BFR 250  Via vascath BP is  98/48.   Patient appears to be tolerating treatment well  Troy Hartzog A 05/21/2016

## 2016-05-21 NOTE — Care Management Note (Addendum)
Case Management Note  Patient Details  Name: Douglas Rose. MRN: [id number] Date of Birth: [birth year]  Subjective/Objective:      Admitted with SIRS/ sepsis 2/2 c.diff, hx of multiple myeloma recently diagnosed and chemotherapy last chemotherapy 3 days ago and recently placed on clindamycin for the last 3 weeks for mandibular abscess.PTA independent with ADL's, no DME usage.         - hospital course - AKI , tachycardia         - temp. Vasc. Cath placed 05/20/2016 for dialysis  [name] (Spouse)     [phone]       PCP: Suzanna Obey  Action/Plan: Nephrology following.... Per PT's recommendation : SNF. CSW is aware.   CM continue to follow for disposition needs.  Expected Discharge Date:                  Expected Discharge Plan:  [name] (resides with wife)  In-House Referral:  Clinical Social Work  Discharge planning Services  CM Consult  Post Acute Care Choice:    Choice offered to:     DME Arranged:    DME Agency:     HH Arranged:    [place] Agency:     Status of Service:  In process, will continue to follow  If discussed at Long Length of Stay Meetings, dates discussed:    Additional Comments:  Sharin Mons, RN 05/21/2016, 1:25 PM

## 2016-05-21 NOTE — Progress Notes (Signed)
Physical Therapy Treatment Patient Details Name: Douglas Rose. MRN: [id number] DOB: 1945-05-25 Today's Date: 05/21/2016    History of Present Illness Douglas Rose. is a 71 y.o. male with history of multiple myeloma on chemotherapy, recent history of C. Diff colitis, who was placed on clindamycin for the last 3 weeks for mandibular abscess who presented to the ER with complaints of diffuse crampy abdominal pain with multiple episodes of diarrhea.  He was found to have sepsis due to C. Diff colitis.  His course has been complicated by ongoing sinus tachycardia and AKI.     PT Comments    Pt a little more lethargic today, not moving LE's as well for exercise as earlier treatment session, but pt was able to participate with transfers throughout the transfer trial with face to face assist.  Pt too lethargic/fatigued to follow transfers with there ex.   Follow Up Recommendations  SNF     Equipment Recommendations  Other (comment) (TBA at next venue)    Recommendations for Other Services       Precautions / Restrictions Precautions Precautions: Fall    Mobility  Bed Mobility Overal bed mobility: Needs Assistance Bed Mobility: Rolling;Sidelying to Sit Rolling: Mod assist (with mod + assist to get knees bent up.) Sidelying to sit: Mod assist;Min assist       General bed mobility comments: needed more assist today, more lethargic  Transfers Overall transfer level: Needs assistance   Transfers: Sit to/from Stand;Squat Pivot Transfers Sit to Stand: Mod assist   Squat pivot transfers: Mod assist     General transfer comment: 2 person assist for safety.  Face to face transfer assist for support/control of trunk and guard of knees for the transfer.  Ambulation/Gait                 Stairs            Wheelchair Mobility    Modified Rankin (Stroke Patients Only)       Balance Overall balance assessment: Needs assistance Sitting-balance support: No  upper extremity supported Sitting balance-Leahy Scale: Fair                                      Cognition Arousal/Alertness: Awake/alert Behavior During Therapy: Flat affect                                          Exercises      General Comments        Pertinent Vitals/Pain Pain Assessment: Faces Faces Pain Scale: Hurts a little bit Pain Location: bottom with wiping Pain Descriptors / Indicators: Burning Pain Intervention(s): Monitored during session    Home Living                      Prior Function            PT Goals (current goals can now be found in the care plan section) Acute Rehab PT Goals Patient Stated Goal: get back home PT Goal Formulation: With patient Time For Goal Achievement: 05/31/16 Potential to Achieve Goals: Fair Progress towards PT goals: Progressing toward goals    Frequency    Min 3X/week      PT Plan Current plan remains appropriate    Co-evaluation  AM-PAC PT "6 Clicks" Daily Activity  Outcome Measure  Difficulty turning over in bed (including adjusting bedclothes, sheets and blankets)?: Total Difficulty moving from lying on back to sitting on the side of the bed? : Total Difficulty sitting down on and standing up from a chair with arms (e.g., wheelchair, bedside commode, etc,.)?: Total Help needed moving to and from a bed to chair (including a wheelchair)?: A Lot Help needed walking in hospital room?: A Lot Help needed climbing 3-5 steps with a railing? : A Lot 6 Click Score: 9    End of Session   Activity Tolerance: Patient limited by fatigue Patient left: in chair;with call bell/phone within reach;with family/visitor present Nurse Communication: Mobility status PT Visit Diagnosis: Unsteadiness on feet (R26.81);Muscle weakness (generalized) (M62.81);Other abnormalities of gait and mobility (R26.89)     Time: 5248-4948 PT Time Calculation (min) (ACUTE ONLY):  26 min  Charges:  $Therapeutic Activity: 23-37 mins                    G Codes:       06-09-16  Donnella Sham, PT [phone]  (pager)   Douglas Rose 06/09/2016, 6:42 PM

## 2016-05-22 LAB — GLUCOSE, CAPILLARY
Glucose-Capillary: 197 mg/dL — ABNORMAL HIGH (ref 65–99)
Glucose-Capillary: 212 mg/dL — ABNORMAL HIGH (ref 65–99)
Glucose-Capillary: 224 mg/dL — ABNORMAL HIGH (ref 65–99)

## 2016-05-22 LAB — HEPATITIS B CORE ANTIBODY, TOTAL: HEP B C TOTAL AB: NEGATIVE

## 2016-05-22 LAB — CBC
HEMATOCRIT: 31.6 % — AB (ref 39.0–52.0)
Hemoglobin: 11.2 g/dL — ABNORMAL LOW (ref 13.0–17.0)
MCH: 28.6 pg (ref 26.0–34.0)
MCHC: 35.4 g/dL (ref 30.0–36.0)
MCV: 80.6 fL (ref 78.0–100.0)
PLATELETS: 297 10*3/uL (ref 150–400)
RBC: 3.92 MIL/uL — ABNORMAL LOW (ref 4.22–5.81)
RDW: 20.3 % — AB (ref 11.5–15.5)
WBC: 12.5 10*3/uL — AB (ref 4.0–10.5)

## 2016-05-22 LAB — RENAL FUNCTION PANEL
ALBUMIN: 1.7 g/dL — AB (ref 3.5–5.0)
Anion gap: 12 (ref 5–15)
BUN: 82 mg/dL — AB (ref 6–20)
CHLORIDE: 101 mmol/L (ref 101–111)
CO2: 23 mmol/L (ref 22–32)
CREATININE: 4.1 mg/dL — AB (ref 0.61–1.24)
Calcium: 7.2 mg/dL — ABNORMAL LOW (ref 8.9–10.3)
GFR calc Af Amer: 15 mL/min — ABNORMAL LOW (ref >60)
GFR, EST NON AFRICAN AMERICAN: 13 mL/min — AB (ref >60)
GLUCOSE: 217 mg/dL — AB (ref 65–99)
PHOSPHORUS: 7 mg/dL — AB (ref 2.5–4.6)
POTASSIUM: 3.4 mmol/L — AB (ref 3.5–5.1)
Sodium: 136 mmol/L (ref 135–145)

## 2016-05-22 LAB — HEPATITIS B SURFACE ANTIBODY,QUALITATIVE: HEP B S AB: NONREACTIVE

## 2016-05-22 LAB — HEPATITIS C ANTIBODY: HCV AB: 0.1 {s_co_ratio} (ref 0.0–0.9)

## 2016-05-22 MED ORDER — INSULIN DETEMIR 100 UNIT/ML ~~LOC~~ SOLN
20.0000 [IU] | Freq: Every day | SUBCUTANEOUS | Status: DC
  Administered 2016-05-23 – 2016-05-31 (×10): 20 [IU] via SUBCUTANEOUS
  Filled 2016-05-22 (×10): qty 0.2

## 2016-05-22 NOTE — Progress Notes (Signed)
[facility] PROGRESS NOTE  Karyl Kinnier. CBJ:[id number] DOB: 1945-09-20 DOA: 05/11/2016 PCP: [name], MD  Interim summary and history of present illness 71 year old male with history of multiple myeloma on chemotherapy and recent mandibular abscess which was treated with clindamycin; presented to the emergency department secondary to crampy abdominal pain and multiple episode of diarrhea. Patient was found to have sepsis due to C. difficile colitis. His course has been now complicated with acute on chronic renal failure and oliguria requiring hemodialysis.  Assessment/Plan: 1-sepsis secondary to C. difficile colitis -Continue to improve -Patient's sepsis features essentially resolve Sepsis features resolved -Will discontinue Flagyl and continue treatment with oral vancomycin -Continue supportive care and as needed antiemetics -Will use Pepcid for gastroesophageal reflux symptoms  2-sinus tachycardia: Appears to be secondary to rebound tachycardia -No chest pain, no palpitations, patient denies feeling short of breath  -Will continue metoprolol for heart rate control  -Will continue telemetry monitoring and electrolytes repletion as needed  3-diabetes mellitus type 2 with hyperglycemia -CBGs continue to be elevated -Will continue sliding scale insulin and Levemir (last one adjusted for better control of his diabetes)  4-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 -Nephrology service is on before and will follow recommendations -Will continue avoiding any nephrotoxic agent for now (ACE/ARB/NSAIDs/diuretics)  -Renal ultrasound has rule out hydronephrosis and obstructive uropathy -Will follow renal function trend and recovery  5-paraphimosis -Patient  has been seen by urology service; at this moment folic catheter removed and phimosis manually reduced -Patient endorses some difficulty passing urine due to scrotal/penile swelling -Will monitor as his a volume status improved with HD  6-hypertension -Well controlled, to slightly soft around hemodialysis treatment -Will continue metoprolol -Follow vital signs and make adjustments as needed on his antihypertensive regimen.  7-metabolic acidosis: Secondary to renal failure -Stable after hemodialysis  -Will continue sodium bicarbonate and follow recommendations from nephrology service regarding further electrolytes adjustments/repletion  -Anticipated next HD treatment today (5/17).  8-hypoalbuminemia with severe protein calorie malnutrition -Encouraged to increase by mouth intake  -will continue feeding supplements -Patient with poor appetite   9-anemia/thrombocytopenia: Due to chronic kidney disease and chemotherapy given for his multiple myeloma. -Hemoglobin level has remained stable. -No needs for transfusion -Will follow trend.  10-hypomagnesemia -Stable. -Will replete as needed and follow trend  11-MM  -Patient will continue follow-up after discharge with his oncologist  -Will continue Zovirax, dose has been adjusted for his renal function.   Code Status: Full code Family Communication: Wife at bedside Disposition Plan: Given increased deconditioning, it might be reasonable pursuing skilled nursing facility at the moment of discharge. For now will continue supportive care and follow recommendations from renal service. We'll discontinue IV Flagyl.   Consultants:  Nephrology  Urology  IR   Procedures:  See below for x-ray reports  Hemodialysis 5/16; next treatment of hemodialysis anticipated for 5/17  Antibiotics:  Vancomycin  05/12/16  Metronidazole  05/13/16  Zovirax   HPI/Subjective: Patient expressing some reflux symptoms, no chest pain, no shortness of  breath and reporting some improvement in his urine output.   Objective: Vitals:   05/22/16 0536 05/22/16 1419  BP: 122/73 90/66  Pulse: (!) 111 91  Resp: 18 16  Temp: 98.2 F (36.8 C) 98.7 F (37.1 C)    Intake/Output Summary (Last 24 hours) at  05/22/16 1549 Last data filed at 05/22/16 1033  Gross per 24 hour  Intake              740 ml  Output                0 ml  Net              740 ml   Filed Weights   05/12/16 0501 05/21/16 0700 05/21/16 0955  Weight: 73.9 kg (163 lb) 90 kg (198 lb 6.6 oz) 88.8 kg (195 lb 12.3 oz)    Exam:   General: Patient is weak, didn't condition and complaining of poor appetite. Still with loose stools, but significant improved. Patient denies chest pain or shortness of breath.   Cardiovascular: S1 and S2, RRR, no rubs or gallops appreciated on exam   Respiratory: No wheezing, no using accessory muscles and overall with fair air movement. Some decrease breath sounds at the bases bilaterally appreciated.   Abdomen: Soft, nontender, nondistended, positive bowel sounds   Musculoskeletal: 2++ bilaterally; no cyanosis or clubbing appreciated.   Data Reviewed: Basic Metabolic Panel:  Recent Labs Lab 05/18/16 0824 05/19/16 0700 05/20/16 0624 05/21/16 0800 05/22/16 0422  NA 133* 134* 133*  134* 136 136  K 3.9 4.2 3.9  4.0 3.3* 3.4*  CL 101 100* 101  102 104 101  CO2 18* 20* 20*  20* 22 23  GLUCOSE 119* 170* 229*  231* 184* 217*  BUN 89* 98* 105*  105* 103* 82*  CREATININE 6.68* 5.92* 5.66*  5.64* 4.98* 4.10*  CALCIUM 7.2* 7.2* 7.3*  7.4* 7.3* 7.2*  PHOS 7.5* 8.7* 8.3* 7.5* 7.0*   Liver Function Tests:  Recent Labs Lab 05/18/16 0824 05/19/16 0700 05/20/16 0624 05/21/16 0800 05/22/16 0422  ALBUMIN 1.7* 1.8* 1.8* 1.7* 1.7*   CBC:  Recent Labs Lab 05/18/16 0824 05/19/16 0700 05/20/16 0624 05/21/16 0800 05/22/16 0422  WBC 14.1* 12.7* 11.6* 10.5 12.5*  NEUTROABS  --   --  9.8*  --   --   HGB 11.5* 11.7* 11.5* 10.9*  11.2*  HCT 31.9* 32.1* 32.6* 30.9* 31.6*  MCV 78.2 79.3 79.3 79.0 80.6  PLT 295 323 345 350 297   CBG:  Recent Labs Lab 05/21/16 1133 05/21/16 1727 05/21/16 2130 05/22/16 0812 05/22/16 1209  GLUCAP 149* 280* 237* 197* 212*    No results found for this or any previous visit (from the past 240 hour(s)).   Studies: Ir Fluoro Guide Cv Line Right  Result Date: 05/20/2016 CLINICAL DATA:  Renal failure and need for temporary hemodialysis catheter. EXAM: NON-TUNNELED CENTRAL VENOUS CATHETER PLACEMENT WITH ULTRASOUND AND FLUOROSCOPIC GUIDANCE FLUOROSCOPY TIME:  Less than 6 seconds. PROCEDURE: The procedure, risks, benefits, and alternatives were explained to the patient. Questions regarding the procedure were encouraged and answered. The patient understands and consents to the procedure. The right neck and chest were prepped with chlorhexidine in a sterile fashion, and a sterile drape was applied covering the operative field. Maximum barrier sterile technique with sterile gowns and gloves were used for the procedure. Local anesthesia was provided with 1% lidocaine. After creating a small venotomy incision, a 21 gauge needle was advanced into the right internal jugular vein under direct, real-time ultrasound guidance. Ultrasound image documentation was performed. After securing guidewire access, the venotomy was dilated. A 13 French, 20 cm non tunneled dialysis catheter was then advanced over the wire. Final catheter positioning was confirmed and documented with a fluoroscopic spot image. The catheter was  aspirated, flushed with saline, and injected with appropriate volume heparin dwells. The catheter exit site was secured with 0-Prolene retention sutures. COMPLICATIONS: None.  No pneumothorax. FINDINGS: After catheter placement, the tip lies at the cavoatrial junction. The catheter aspirates normally and is ready for immediate use. IMPRESSION: Placement of non-tunneled central venous catheter via the  right internal jugular vein. The catheter tip lies at the cavoatrial junction. The catheter is ready for immediate use. Electronically Signed   By: Aletta Edouard M.D.   On: 05/20/2016 16:49   Ir US Guide Vasc Access Right  Result Date: 05/20/2016 CLINICAL DATA:  Renal failure and need for temporary hemodialysis catheter. EXAM: NON-TUNNELED CENTRAL VENOUS CATHETER PLACEMENT WITH ULTRASOUND AND FLUOROSCOPIC GUIDANCE FLUOROSCOPY TIME:  Less than 6 seconds. PROCEDURE: The procedure, risks, benefits, and alternatives were explained to the patient. Questions regarding the procedure were encouraged and answered. The patient understands and consents to the procedure. The right neck and chest were prepped with chlorhexidine in a sterile fashion, and a sterile drape was applied covering the operative field. Maximum barrier sterile technique with sterile gowns and gloves were used for the procedure. Local anesthesia was provided with 1% lidocaine. After creating a small venotomy incision, a 21 gauge needle was advanced into the right internal jugular vein under direct, real-time ultrasound guidance. Ultrasound image documentation was performed. After securing guidewire access, the venotomy was dilated. A 13 French, 20 cm non tunneled dialysis catheter was then advanced over the wire. Final catheter positioning was confirmed and documented with a fluoroscopic spot image. The catheter was aspirated, flushed with saline, and injected with appropriate volume heparin dwells. The catheter exit site was secured with 0-Prolene retention sutures. COMPLICATIONS: None.  No pneumothorax. FINDINGS: After catheter placement, the tip lies at the cavoatrial junction. The catheter aspirates normally and is ready for immediate use. IMPRESSION: Placement of non-tunneled central venous catheter via the right internal jugular vein. The catheter tip lies at the cavoatrial junction. The catheter is ready for immediate use. Electronically  Signed   By: Aletta Edouard M.D.   On: 05/20/2016 16:49    Scheduled Meds: . acyclovir  200 mg Oral Daily  . atorvastatin  20 mg Oral Daily  . famotidine  20 mg Oral Daily  . feeding supplement  1 Container Oral TID BM  . feeding supplement (NEPRO CARB STEADY)  237 mL Oral BID BM  . feeding supplement (PRO-STAT SUGAR FREE 64)  30 mL Oral BID  . heparin subcutaneous  5,000 Units Subcutaneous Q8H  . insulin aspart  0-9 Units Subcutaneous TID WC  . insulin detemir  20 Units Subcutaneous QHS  . lactobacillus acidophilus  2 tablet Oral TID  . metoprolol succinate  75 mg Oral Daily  . vancomycin  500 mg Oral Q6H   Continuous Infusions:   Principal Problem:   SIRS (systemic inflammatory response syndrome) (HCC) Active Problems:   Coronary artery disease   Hyperlipidemia   Hypertension   Multiple myeloma (HCC)   Colitis   Type 2 diabetes mellitus with vascular disease ([place])   Sinus tachycardia   AKI (acute kidney injury) ([place])   Enteritis due to Clostridium difficile    Time spent: 25 minutes    Barton Dubois  [facility] Pager [phone]. If 7PM-7AM, please contact night-coverage at [email or web address], password [facility] 05/22/2016, 3:49 PM  LOS: 10 days

## 2016-05-22 NOTE — Progress Notes (Signed)
Patient verbalized that he is very weak and he refused to walk. Will continue to monitor.

## 2016-05-22 NOTE — Progress Notes (Signed)
Inpatient Diabetes Program Recommendations  AACE/ADA: New Consensus Statement on Inpatient Glycemic Control (2015)  Target Ranges:  Prepandial:   less than 140 mg/dL      Peak postprandial:   less than 180 mg/dL (1-2 hours)      Critically ill patients:  140 - 180 mg/dL   Results for Douglas Rose, Douglas Rose (MRN [id number]) as of 05/22/2016 09:26  Ref. Range 05/20/2016 21:20 05/21/2016 11:33 05/21/2016 17:27 05/21/2016 21:30 05/22/2016 08:12  Glucose-Capillary Latest Ref Range: 65 - 99 mg/dL 181 (H) 149 (H) 280 (H) 237 (H) 197 (H)   Review of Glycemic Control  Diabetes history: DM2 Outpatient Diabetes medications: Lantus 5 units daily, Novolog 2-6 units TID with meals Current orders for Inpatient glycemic control: Lantus 15 units QHS, Novolog 0-9 units TID with meals  Inpatient Diabetes Program Recommendations: Insulin - Basal: Please consider increasing Lantus to 17 units QHS. Correction (SSI): Please consider ordering Novolog 0-5 units QHS for bedtime correction scale.  Thanks, Barnie Alderman, RN, MSN, CDE Diabetes Coordinator Inpatient Diabetes Program [phone] (Team Pager from 8am to 5pm)

## 2016-05-22 NOTE — Progress Notes (Signed)
CSW received consult regarding PT recommendation of SNF at discharge.  CSW spoke with patient's wife. She stated that she would prefer to take the patient home with home health. She states she understands they do not come out everyday and she also has family and friends to help. RNCM alerted.   CSW signing off.    Douglas Rose LCSWA [phone]

## 2016-05-22 NOTE — Progress Notes (Signed)
Subjective:  Feels and looks very weak... UOP not recorded but he says he is passing with some difficulty due to penile swelling- s/p HD yesterday- removed 700 tolerated well- for second tx today  Objective Vital signs in last 24 hours: Vitals:   05/21/16 1105 05/21/16 1610 05/21/16 2133 05/22/16 0536  BP: (!) 79/56 95/64 105/68 122/73  Pulse: (!) 114 (!) 117 (!) 116 (!) 111  Resp:  20 18 18   Temp:  97.7 F (36.5 C) 98.6 F (37 C) 98.2 F (36.8 C)  TempSrc:  Oral    SpO2:  98% 96% 99%  Weight:      Height:       Weight change: -1.2 kg (-2 lb 10.3 oz)  Intake/Output Summary (Last 24 hours) at 05/22/16 0913 Last data filed at 05/22/16 0840  Gross per 24 hour  Intake              540 ml  Output              700 ml  Net             -160 ml    Assessment/ Plan: Pt is a 71 y.o. yo male with baseline MM and creatinine in the mid 1's.  who was admitted on 05/11/2016 with C diff colitis- had contrasted CT.  Course complicated by AKI  Assessment/Plan: 1. Renal- creatinine 5/5 was 1.67 but has worsened basically every day he has been in house.  I was hoping he was showing some signs of improvement but unfortunately it has not continued-  patient seems clinically uremic.  It is difficult for me to tell if he is miserable from C diff or uremia but made the decision to have him undergo  HD yesterday and today to correct the uremia and also the volume overload. IR placed vascath 5/15. Given baseline crt good would hope only temporary and not permanent.  I also have decreased valcyte to more appropriate dose based on renal function. He is voiding without foley, hopefully as we get volume off this will improve as his penile swelling will come down. Likely will do HD on Friday vs Sat as well 2. HTN/vol- overloaded- BP marginal  - on lopressor for tachycardia will stop lasix as is starting HD- third spacing due to low albumin  3. Anemia- due to CKD and multiple myeloma- hgb 11.2- supportive care  4.  Metabolic acidosis- on oral bicarb- will correct further with HD- will stop pills  5.C Diff- still symptomatic- on oral vanc and flagyl  6. Hyperphos- treat only with HD for now     Foster: Basic Metabolic Panel:  Recent Labs Lab 05/20/16 0624 05/21/16 0800 05/22/16 0422  NA 133*  134* 136 136  K 3.9  4.0 3.3* 3.4*  CL 101  102 104 101  CO2 20*  20* 22 23  GLUCOSE 229*  231* 184* 217*  BUN 105*  105* 103* 82*  CREATININE 5.66*  5.64* 4.98* 4.10*  CALCIUM 7.3*  7.4* 7.3* 7.2*  PHOS 8.3* 7.5* 7.0*   Liver Function Tests:  Recent Labs Lab 05/20/16 0624 05/21/16 0800 05/22/16 0422  ALBUMIN 1.8* 1.7* 1.7*   No results for input(s): LIPASE, AMYLASE in the last 168 hours. No results for input(s): AMMONIA in the last 168 hours. CBC:  Recent Labs Lab 05/18/16 0824 05/19/16 0700 05/20/16 0624 05/21/16 0800 05/22/16 0422  WBC 14.1* 12.7* 11.6* 10.5 12.5*  NEUTROABS  --   --  9.8*  --   --   HGB 11.5* 11.7* 11.5* 10.9* 11.2*  HCT 31.9* 32.1* 32.6* 30.9* 31.6*  MCV 78.2 79.3 79.3 79.0 80.6  PLT 295 323 345 350 297   Cardiac Enzymes: No results for input(s): CKTOTAL, CKMB, CKMBINDEX, TROPONINI in the last 168 hours. CBG:  Recent Labs Lab 05/20/16 2120 05/21/16 1133 05/21/16 1727 05/21/16 2130 05/22/16 0812  GLUCAP 181* 149* 280* 237* 197*    Iron Studies: No results for input(s): IRON, TIBC, TRANSFERRIN, FERRITIN in the last 72 hours. Studies/Results: Ir Fluoro Guide Cv Line Right  Result Date: 05/20/2016 CLINICAL DATA:  Renal failure and need for temporary hemodialysis catheter. EXAM: NON-TUNNELED CENTRAL VENOUS CATHETER PLACEMENT WITH ULTRASOUND AND FLUOROSCOPIC GUIDANCE FLUOROSCOPY TIME:  Less than 6 seconds. PROCEDURE: The procedure, risks, benefits, and alternatives were explained to the patient. Questions regarding the procedure were encouraged and answered. The patient understands and consents to the procedure. The right  neck and chest were prepped with chlorhexidine in a sterile fashion, and a sterile drape was applied covering the operative field. Maximum barrier sterile technique with sterile gowns and gloves were used for the procedure. Local anesthesia was provided with 1% lidocaine. After creating a small venotomy incision, a 21 gauge needle was advanced into the right internal jugular vein under direct, real-time ultrasound guidance. Ultrasound image documentation was performed. After securing guidewire access, the venotomy was dilated. A 13 French, 20 cm non tunneled dialysis catheter was then advanced over the wire. Final catheter positioning was confirmed and documented with a fluoroscopic spot image. The catheter was aspirated, flushed with saline, and injected with appropriate volume heparin dwells. The catheter exit site was secured with 0-Prolene retention sutures. COMPLICATIONS: None.  No pneumothorax. FINDINGS: After catheter placement, the tip lies at the cavoatrial junction. The catheter aspirates normally and is ready for immediate use. IMPRESSION: Placement of non-tunneled central venous catheter via the right internal jugular vein. The catheter tip lies at the cavoatrial junction. The catheter is ready for immediate use. Electronically Signed   By: Aletta Edouard M.D.   On: 05/20/2016 16:49   Ir US Guide Vasc Access Right  Result Date: 05/20/2016 CLINICAL DATA:  Renal failure and need for temporary hemodialysis catheter. EXAM: NON-TUNNELED CENTRAL VENOUS CATHETER PLACEMENT WITH ULTRASOUND AND FLUOROSCOPIC GUIDANCE FLUOROSCOPY TIME:  Less than 6 seconds. PROCEDURE: The procedure, risks, benefits, and alternatives were explained to the patient. Questions regarding the procedure were encouraged and answered. The patient understands and consents to the procedure. The right neck and chest were prepped with chlorhexidine in a sterile fashion, and a sterile drape was applied covering the operative field. Maximum  barrier sterile technique with sterile gowns and gloves were used for the procedure. Local anesthesia was provided with 1% lidocaine. After creating a small venotomy incision, a 21 gauge needle was advanced into the right internal jugular vein under direct, real-time ultrasound guidance. Ultrasound image documentation was performed. After securing guidewire access, the venotomy was dilated. A 13 French, 20 cm non tunneled dialysis catheter was then advanced over the wire. Final catheter positioning was confirmed and documented with a fluoroscopic spot image. The catheter was aspirated, flushed with saline, and injected with appropriate volume heparin dwells. The catheter exit site was secured with 0-Prolene retention sutures. COMPLICATIONS: None.  No pneumothorax. FINDINGS: After catheter placement, the tip lies at the cavoatrial junction. The catheter aspirates normally and is ready for immediate use. IMPRESSION: Placement of non-tunneled central venous catheter via the right internal jugular vein.  The catheter tip lies at the cavoatrial junction. The catheter is ready for immediate use. Electronically Signed   By: Aletta Edouard M.D.   On: 05/20/2016 16:49   Medications: Infusions: . metronidazole Stopped (05/22/16 5409)    Scheduled Medications: . acyclovir  200 mg Oral Daily  . atorvastatin  20 mg Oral Daily  . famotidine  20 mg Oral Daily  . feeding supplement  1 Container Oral TID BM  . feeding supplement (NEPRO CARB STEADY)  237 mL Oral BID BM  . feeding supplement (PRO-STAT SUGAR FREE 64)  30 mL Oral BID  . heparin subcutaneous  5,000 Units Subcutaneous Q8H  . insulin aspart  0-9 Units Subcutaneous TID WC  . insulin detemir  15 Units Subcutaneous QHS  . lactobacillus acidophilus  2 tablet Oral TID  . metoprolol succinate  75 mg Oral Daily  . sodium bicarbonate  650 mg Oral TID  . vancomycin  500 mg Oral Q6H    have reviewed scheduled and prn medications.  Physical  Exam: General:looks a little brighter to me- but still very weak-  Heart: tachy Lungs: mostly clear Abdomen: soft, non tender Extremities: pitting edema throughout including scrotum    05/22/2016,9:13 AM  LOS: 10 days

## 2016-05-22 NOTE — Progress Notes (Signed)
Physical Therapy Treatment Patient Details Name: Douglas Rose. MRN: [id number] DOB: 04-09-1945 Today's Date: 05/22/2016    History of Present Illness Douglas Rose. is a 71 y.o. male with history of multiple myeloma on chemotherapy, recent history of C. Diff colitis, who was placed on clindamycin for the last 3 weeks for mandibular abscess who presented to the ER with complaints of diffuse crampy abdominal pain with multiple episodes of diarrhea.  He was found to have sepsis due to C. Diff colitis.  His course has been complicated by ongoing sinus tachycardia and AKI.     PT Comments    Progressing slowly with basic mobility.  Pt only able to tolerate a few exercises in bed and 2-3 trials of standing before getting in the chair.   Follow Up Recommendations  SNF     Equipment Recommendations  Other (comment)    Recommendations for Other Services       Precautions / Restrictions Precautions Precautions: Fall    Mobility  Bed Mobility Overal bed mobility: Needs Assistance Bed Mobility: Rolling;Sidelying to Sit Rolling: Min assist Sidelying to sit: Min assist;+2 for physical assistance       General bed mobility comments: pt improved as he became more arounsed/less lethargic  Transfers Overall transfer level: Needs assistance Equipment used: Rolling walker (2 wheeled) Transfers: Sit to/from [facility] Sit to Stand: Min assist;+2 physical assistance (x2) Stand pivot transfers: +2 safety/equipment;Mod assist       General transfer comment: cues for hand placement, pivot with RW and mod assist for stability and maneuvering the RW  Ambulation/Gait             General Gait Details: pt not ready to try   Stairs            Wheelchair Mobility    Modified Rankin (Stroke Patients Only)       Balance     Sitting balance-Leahy Scale: Fair       Standing balance-Leahy Scale: Poor                               Cognition Arousal/Alertness: Awake/alert Behavior During Therapy: Flat affect Overall Cognitive Status: Within Functional Limits for tasks assessed                                        Exercises General Exercises - Lower Extremity Heel Slides: AROM;Right;Left;10 reps (graded resistance in extension) Straight Leg Raises: AAROM;Right;Left;10 reps;Supine    General Comments        Pertinent Vitals/Pain Pain Assessment: Faces Faces Pain Scale: Hurts little more Pain Location: bottom with wiping Pain Descriptors / Indicators: Burning Pain Intervention(s): Monitored during session    Home Living Family/patient expects to be discharged to:: Private residence Living Arrangements: Spouse/significant other;Children                  Prior Function            PT Goals (current goals can now be found in the care plan section) Acute Rehab PT Goals PT Goal Formulation: With patient Time For Goal Achievement: 05/31/16 Potential to Achieve Goals: Fair Progress towards PT goals: Progressing toward goals    Frequency    Min 3X/week      PT Plan Current plan remains appropriate    Co-evaluation  AM-PAC PT "6 Clicks" Daily Activity  Outcome Measure  Difficulty turning over in bed (including adjusting bedclothes, sheets and blankets)?: Total Difficulty moving from lying on back to sitting on the side of the bed? : Total Difficulty sitting down on and standing up from a chair with arms (e.g., wheelchair, bedside commode, etc,.)?: Total Help needed moving to and from a bed to chair (including a wheelchair)?: A Lot Help needed walking in hospital room?: A Lot Help needed climbing 3-5 steps with a railing? : A Lot 6 Click Score: 9    End of Session Equipment Utilized During Treatment: Gait belt Activity Tolerance: Patient tolerated treatment well;Patient limited by fatigue Patient left: in chair;with call bell/phone within reach;with  family/visitor present Nurse Communication: Mobility status PT Visit Diagnosis: Unsteadiness on feet (R26.81);Muscle weakness (generalized) (M62.81);Other abnormalities of gait and mobility (R26.89)     Time: 4996-9249 PT Time Calculation (min) (ACUTE ONLY): 33 min  Charges:  $Therapeutic Exercise: 8-22 mins $Therapeutic Activity: 8-22 mins                    G Codes:       30-May-2016  Douglas Rose, PT [phone]  (pager)   Douglas Rose 2016-05-30, 4:09 PM

## 2016-05-23 ENCOUNTER — Ambulatory Visit: Payer: Medicare HMO

## 2016-05-23 DIAGNOSIS — K219 Gastro-esophageal reflux disease without esophagitis: Secondary | ICD-10-CM

## 2016-05-23 DIAGNOSIS — E876 Hypokalemia: Secondary | ICD-10-CM

## 2016-05-23 DIAGNOSIS — N471 Phimosis: Secondary | ICD-10-CM

## 2016-05-23 LAB — CBC
HEMATOCRIT: 29.9 % — AB (ref 39.0–52.0)
HEMOGLOBIN: 10.4 g/dL — AB (ref 13.0–17.0)
MCH: 28.2 pg (ref 26.0–34.0)
MCHC: 34.8 g/dL (ref 30.0–36.0)
MCV: 81 fL (ref 78.0–100.0)
Platelets: 260 10*3/uL (ref 150–400)
RBC: 3.69 MIL/uL — AB (ref 4.22–5.81)
RDW: 20.7 % — ABNORMAL HIGH (ref 11.5–15.5)
WBC: 12.9 10*3/uL — ABNORMAL HIGH (ref 4.0–10.5)

## 2016-05-23 LAB — GLUCOSE, CAPILLARY
Glucose-Capillary: 111 mg/dL — ABNORMAL HIGH (ref 65–99)
Glucose-Capillary: 129 mg/dL — ABNORMAL HIGH (ref 65–99)
Glucose-Capillary: 144 mg/dL — ABNORMAL HIGH (ref 65–99)
Glucose-Capillary: 185 mg/dL — ABNORMAL HIGH (ref 65–99)
Glucose-Capillary: 248 mg/dL — ABNORMAL HIGH (ref 65–99)

## 2016-05-23 LAB — RENAL FUNCTION PANEL
ALBUMIN: 1.7 g/dL — AB (ref 3.5–5.0)
ANION GAP: 10 (ref 5–15)
BUN: 58 mg/dL — ABNORMAL HIGH (ref 6–20)
CALCIUM: 7.2 mg/dL — AB (ref 8.9–10.3)
CO2: 23 mmol/L (ref 22–32)
Chloride: 104 mmol/L (ref 101–111)
Creatinine, Ser: 3.41 mg/dL — ABNORMAL HIGH (ref 0.61–1.24)
GFR calc non Af Amer: 17 mL/min — ABNORMAL LOW (ref >60)
GFR, EST AFRICAN AMERICAN: 19 mL/min — AB (ref >60)
Glucose, Bld: 133 mg/dL — ABNORMAL HIGH (ref 65–99)
PHOSPHORUS: 5.1 mg/dL — AB (ref 2.5–4.6)
POTASSIUM: 3.1 mmol/L — AB (ref 3.5–5.1)
SODIUM: 137 mmol/L (ref 135–145)

## 2016-05-23 MED ORDER — GI COCKTAIL ~~LOC~~: 30.0000 mL | Freq: Two times a day (BID) | ORAL | Status: DC | PRN

## 2016-05-23 MED ORDER — SACCHAROMYCES BOULARDII 250 MG PO CAPS
250.0000 mg | ORAL_CAPSULE | Freq: Two times a day (BID) | ORAL | Status: DC
  Administered 2016-05-23 – 2016-05-31 (×16): 250 mg via ORAL
  Filled 2016-05-23 (×16): qty 1

## 2016-05-23 NOTE — Progress Notes (Signed)
Nutrition Follow-up  DOCUMENTATION CODES:   Not applicable  INTERVENTION:   -D/c Boost Breeze po TID, each supplement provides 250 kcal and 9 grams of protein, due to poor acceptance -Continue Nepro Shake po BID, each supplement provides 425 kcal and 19 grams protein -Continue 30 ml Prostat TID, each supplement provides 100 kcals and 15 grams protein -Due to prolonged poor oral intake, strongly consider enteral nutrition support via cortrak tube. Recommend:  Initiate Nepro @ 20 ml/hr via cortrak tube and increase by 10 ml every 12 hours to goal rate of 40 ml/hr.   30 ml Prostat daily.    Tube feeding regimen provides 1828 kcal (100% of needs), 93 grams of protein, and 698 ml of H2O.   -If TF is initiated, recommend closely monitor Mg, K, and Phos daily x 3 days, due to high refeeding risk  NUTRITION DIAGNOSIS:   Inadequate oral intake related to poor appetite, vomiting, nausea as evidenced by meal completion < 25%.  Ongoing  GOAL:   Patient will meet greater than or equal to 90% of their needs  Unmet  MONITOR:   PO intake, Supplement acceptance, Labs, Weight trends, Skin, I & O's  REASON FOR ASSESSMENT:   Malnutrition Screening Tool    ASSESSMENT:   Douglas Rose. is a 71 y.o. male with history of multiple myeloma recently diagnosed and chemotherapy last chemotherapy 3 days ago and recently placed on clindamycin for the last 3 weeks for mandibular abscess presents to the ER with complaints of diffuse crampy abdominal pain with multiple episodes of diarrhea. Patient had one episode of nausea and vomiting. Denies any fever or chills. Over the last 4 weeks patient has been on chemotherapy for multiple myeloma. Patient states over the last 24 hours patient also had some exertional chest pressure. No chest pain on resting.   Pt underwent temporary HD cath and first HD treatment on 05/21/16 related to uremia. Per nephrology notes, hopeful HD will be temporary.   Spoke  with pt granddaughter at bedside, who requests that this RD not wake pt up. She confirms that pt continues to have very poor intake, but is unsure if pt has a preference in relation to supplements.   Intake continues to be very poor; PO: 0%. Case discussed with RN, who reports that pt is often refusing medications. Observed pt refuse AM meds during visit. Per RN, pt does not like Boost Breeze, however, unsure of he would tolerance Nepro or Prostat. Per MAR, pt has intermittently refused both of these supplements as well.   Per RN notes after RD visit, pt wife is requesting a feeding tube.   Labs reviewed: K: 3.1, Phos: 5.1, CBGS: 111-224.   Diet Order:  Diet full liquid Room service appropriate? Yes; Fluid consistency: Thin  Skin:  Reviewed, no issues  Last BM:  05/22/16  Height:   Ht Readings from Last 1 Encounters:  05/12/16 _0  (1.803 m)    Weight:   Wt Readings from Last 1 Encounters:  05/22/16 199 lb 1.2 oz (90.3 kg)    Ideal Body Weight:  78.2 kg  BMI:  Body mass index is 27.77 kg/m.  Estimated Nutritional Needs:   Kcal:  1800-2000  Protein:  90-105 grams  Fluid:  1.8-2.0 L  EDUCATION NEEDS:   Education needs addressed  Douglas Rose A. Jimmye Norman, RD, LDN, CDE Pager: [phone] After hours Pager: [phone]

## 2016-05-23 NOTE — Progress Notes (Signed)
[facility] PROGRESS NOTE  Karyl Kinnier. ONG:[id number] DOB: 1945/04/12 DOA: 05/11/2016 PCP: [name], MD  Interim summary and history of present illness 71 year old male with history of multiple myeloma on chemotherapy and recent mandibular abscess which was treated with clindamycin; presented to the emergency department secondary to crampy abdominal pain and multiple episode of diarrhea. Patient was found to have sepsis due to C. difficile colitis. His course has been now complicated with acute on chronic renal failure and oliguria requiring hemodialysis.  Assessment/Plan: 1-sepsis secondary to C. difficile colitis -Slowly improving  -Patient and family less episodes of loose stools and increase in his stool form -Will add florastor -Continue avoiding PPIs -Continue supportive care and follow clinical response. -Patient's sepsis features essentially resolved.  2-sinus tachycardia: Appears to be secondary to rebound tachycardia -No chest pain, no shortness of breath. -Patient also denies any palpitation symptoms. -Will continue metoprolol  -Continue electrolytes repletion as needed and will also continue assessment on telemetry bed.   3-diabetes mellitus type 2 with hyperglycemia -CBGs is slightly better with most recent adjustment to his hypoglycemic regimen -Will continue sliding scale insulin and Levemir -Continue adjusting insulin therapy as needed based on his CBGs fluctuation.  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 -At this moment has received back-to-back 2 hemodialysis treatments -Anticipated dialysis is for 5/19 -Improvement in his volume status and also in his uremia/Cr range appreciated. -Will continue holding nephrotoxic agents and follow renal service recommendations. -Recent renal  ultrasound demonstrated no obstructive uropathy or hydronephrosis.  5-paraphimosis -Seen by Urology; phimosis manually reduced and foley discontinued -will anticipated further improvements with decrease in swelling. -will monitor   6-hypertension -overall well controlled -will continue metoprolol  7-metabolic acidosis: Secondary to renal failure -Stable/improved after hemodialysis treatment -Continue sodium bicarbonate and follow nephrology recommendations.  8-hypoalbuminemia with severe protein calorie malnutrition -Discussed at large with patient and family members at bedside; patient has been encouraged to increase by mouth intake  -will continue Prostat and Nepro for feeding supplements.  9-anemia/thrombocytopenia: Due to chronic kidney disease and chemotherapy given for his multiple myeloma. -Hemoglobin has remained stable and there is no signs of acute bleeding  -Will continue monitoring trend -No need for transfusion currently   10-hypomagnesemia -Overall stable. -Will monitor trend and replete as needed  11-MM  -Outpatient follow-up with oncology service. -Continue Zovirax prophylactically.   Code Status: Full code Family Communication: Wife at bedside Disposition Plan: Will need SNF at discharge most likely. Will follow renal service recommendations. Continue treatment for C. difficile colitis.   Consultants:  Nephrology  Urology  IR   Procedures:  See below for x-ray reports  Hemodialysis 5/16; next treatment of hemodialysis anticipated for 5/17  Antibiotics:  Vancomycin  05/12/16  Metronidazole  05/13/16  Zovirax   HPI/Subjective: Patient reports feeling weak, poor appetite, complaining of nausea and feeling sick on his stomach. Has tolerated pretty well two hemodialysis treatment. Improvement of his fluid overload status appreciated. No chest pain, no shortness of breath.  Objective: Vitals:   05/23/16 1039 05/23/16 1410  BP:  (!) 102/59   Pulse:  (!) 109  Resp: 16 16  Temp:  97.6 F (36.4 C)    Intake/Output Summary (Last 24 hours) at 05/23/16 1812 Last data filed at 05/23/16 1600  Gross per 24 hour  Intake  250 ml  Output              931 ml  Net             -681 ml   Filed Weights   05/21/16 0955 05/22/16 2025 05/22/16 2334  Weight: 88.8 kg (195 lb 12.3 oz) 91.5 kg (201 lb 11.5 oz) 90.3 kg (199 lb 1.2 oz)    Exam:   General: Patient is afebrile, denies chest pain or shortness of breath. Endorses improvement in his overall fluid overload status and increase in his urine output. Complaining of nausea, weakness, deconditioning, poor appetite.   Cardiovascular: Regular rate and rhythm, S1 and S2, no murmurs, no gallops, no rubs.   Respiratory: Decreased breath sounds at the bases, no frank crackles, no wheezing, no using accessory muscles. Good oxygen saturation on room air.   Abdomen: Soft, nontender, nondistended, positive bowel sounds.    Musculoskeletal: 1-2 ++ edema, no cyanosis, no clubbing.    Data Reviewed: Basic Metabolic Panel:  Recent Labs Lab 05/19/16 0700 05/20/16 0624 05/21/16 0800 05/22/16 0422 05/23/16 0721  NA 134* 133*  134* 136 136 137  K 4.2 3.9  4.0 3.3* 3.4* 3.1*  CL 100* 101  102 104 101 104  CO2 20* 20*  20* _0 GLUCOSE 170* 229*  231* 184* 217* 133*  BUN 98* 105*  105* 103* 82* 58*  CREATININE 5.92* 5.66*  5.64* 4.98* 4.10* 3.41*  CALCIUM 7.2* 7.3*  7.4* 7.3* 7.2* 7.2*  PHOS 8.7* 8.3* 7.5* 7.0* 5.1*   Liver Function Tests:  Recent Labs Lab 05/19/16 0700 05/20/16 0624 05/21/16 0800 05/22/16 0422 05/23/16 0721  ALBUMIN 1.8* 1.8* 1.7* 1.7* 1.7*   CBC:  Recent Labs Lab 05/19/16 0700 05/20/16 0624 05/21/16 0800 05/22/16 0422 05/23/16 0721  WBC 12.7* 11.6* 10.5 12.5* 12.9*  NEUTROABS  --  9.8*  --   --   --   HGB 11.7* 11.5* 10.9* 11.2* 10.4*  HCT 32.1* 32.6* 30.9* 31.6* 29.9*  MCV 79.3 79.3 79.0 80.6 81.0  PLT 323 345 350 297 260    CBG:  Recent Labs Lab 05/22/16 1709 05/23/16 0011 05/23/16 0934 05/23/16 1228 05/23/16 1801  GLUCAP 224* 144* 129* 111* 185*    No results found for this or any previous visit (from the past 240 hour(s)).   Studies: No results found.  Scheduled Meds: . acyclovir  200 mg Oral Daily  . atorvastatin  20 mg Oral Daily  . famotidine  20 mg Oral Daily  . feeding supplement (NEPRO CARB STEADY)  237 mL Oral BID BM  . feeding supplement (PRO-STAT SUGAR FREE 64)  30 mL Oral BID  . heparin subcutaneous  5,000 Units Subcutaneous Q8H  . insulin aspart  0-9 Units Subcutaneous TID WC  . insulin detemir  20 Units Subcutaneous QHS  . lactobacillus acidophilus  2 tablet Oral TID  . metoprolol succinate  75 mg Oral Daily  . saccharomyces boulardii  250 mg Oral BID  . vancomycin  500 mg Oral Q6H   Continuous Infusions:   Principal Problem:   SIRS (systemic inflammatory response syndrome) (HCC) Active Problems:   Coronary artery disease   Hyperlipidemia   Hypertension   Multiple myeloma (HCC)   Colitis   Type 2 diabetes mellitus with vascular disease ([place])   Sinus tachycardia   AKI (acute kidney injury) ([place])   Enteritis due to Clostridium difficile    Time spent: 25 minutes    Barton Dubois  [facility] Pager [phone]. If 7PM-7AM, please contact night-coverage at [email or web address], password [facility] 05/23/2016, 6:12 PM  LOS: 11 days

## 2016-05-23 NOTE — Progress Notes (Signed)
Patient refusing all medications and care, granddaughter called the patients wife. Wife would like a feeding tube place, all medication given IV, and to consult with provider. She is on her way to the hospital and will notified provider upon arrival.

## 2016-05-23 NOTE — Progress Notes (Signed)
Subjective:  S/p second HD yesterday with close to 1000 removed- UOP not recorded but he says he is passing.  His BUN is down to 50's- he says he is now up to Rose "3"  Has been refusing treatment and tells me it is due to nausea  Objective Vital signs in last 24 hours: Vitals:   05/22/16 2334 05/23/16 0013 05/23/16 0613 05/23/16 1039  BP: (!) 146/69 108/71 105/61   Pulse: (!) 102 (!) 115 (!) 107   Resp: 18 17 17 16   Temp: 97.4 F (36.3 C) 97.7 F (36.5 C) 98.5 F (36.9 C)   TempSrc: Oral Oral Oral   SpO2: 98% 94% 99%   Weight: 90.3 kg (199 lb 1.2 oz)     Height:       Weight change: 2.7 kg (5 lb 15.2 oz)  Intake/Output Summary (Last 24 hours) at 05/23/16 1117 Last data filed at 05/23/16 0900  Gross per 24 hour  Intake              130 ml  Output              927 ml  Net             -797 ml    Assessment/ Plan: Pt is Rose 71 y.o. yo male with baseline MM and creatinine in the mid 1's.  who was admitted on 05/11/2016 with C diff colitis- had contrasted CT.  Course complicated by AKI  Assessment/Plan: 1. Renal- creatinine 5/5 was 1.67 but has worsened since he has been in house.  I was hoping he was showing some signs of improvement but unfortunately it has not continued-  patient seemed clinically uremic.  It is difficult for me to tell if he is miserable from C diff or uremia but made the decision to have him undergo  First HD 5/16, second late last night to correct the uremia and also the volume overload. IR placed vascath late 5/15. Given baseline crt good would hope only temporary and not permanent.  I also have decreased valcyte to more appropriate dose based on renal function. He is voiding without foley, hopefully as we get volume off this will improve as his penile swelling will come down. Since done so late last night , plan third tx for Sat 2. HTN/vol- overloaded- BP marginal  - on lopressor for tachycardia have stopped lasix as is starting HD- third spacing due to low albumin  3.  Anemia- due to CKD and multiple myeloma- hgb 11.2>10.4- supportive care  4. Metabolic acidosis- on oral bicarb- will correct further with HD- have stopped pills  5.C Diff- still symptomatic- on oral vanc and flagyl  6. Hyperphos- treat only with HD for now 7. Nausea- due to c diff, meds or uremia- if due to uremia should be getting better- is really limiting his improvement     Douglas Rose    Labs: Basic Metabolic Panel:  Recent Labs Lab 05/21/16 0800 05/22/16 0422 05/23/16 0721  NA 136 136 137  K 3.3* 3.4* 3.1*  CL 104 101 104  CO2 22 23 23   GLUCOSE 184* 217* 133*  BUN 103* 82* 58*  CREATININE 4.98* 4.10* 3.41*  CALCIUM 7.3* 7.2* 7.2*  PHOS 7.5* 7.0* 5.1*   Liver Function Tests:  Recent Labs Lab 05/21/16 0800 05/22/16 0422 05/23/16 0721  ALBUMIN 1.7* 1.7* 1.7*   No results for input(s): LIPASE, AMYLASE in the last 168 hours. No results for input(s): AMMONIA in the last  168 hours. CBC:  Recent Labs Lab 05/19/16 0700 05/20/16 0624 05/21/16 0800 05/22/16 0422 05/23/16 0721  WBC 12.7* 11.6* 10.5 12.5* 12.9*  NEUTROABS  --  9.8*  --   --   --   HGB 11.7* 11.5* 10.9* 11.2* 10.4*  HCT 32.1* 32.6* 30.9* 31.6* 29.9*  MCV 79.3 79.3 79.0 80.6 81.0  PLT 323 345 350 297 260   Cardiac Enzymes: No results for input(s): CKTOTAL, CKMB, CKMBINDEX, TROPONINI in the last 168 hours. CBG:  Recent Labs Lab 05/22/16 0812 05/22/16 1209 05/22/16 1709 05/23/16 0011 05/23/16 0934  GLUCAP 197* 212* 224* 144* 129*    Iron Studies: No results for input(s): IRON, TIBC, TRANSFERRIN, FERRITIN in the last 72 hours. Studies/Results: No results found. Medications: Infusions:   Scheduled Medications: . acyclovir  200 mg Oral Daily  . atorvastatin  20 mg Oral Daily  . famotidine  20 mg Oral Daily  . feeding supplement  1 Container Oral TID BM  . feeding supplement (NEPRO CARB STEADY)  237 mL Oral BID BM  . feeding supplement (PRO-STAT SUGAR FREE 64)  30 mL Oral  BID  . heparin subcutaneous  5,000 Units Subcutaneous Q8H  . insulin aspart  0-9 Units Subcutaneous TID WC  . insulin detemir  20 Units Subcutaneous QHS  . lactobacillus acidophilus  2 tablet Oral TID  . metoprolol succinate  75 mg Oral Daily  . vancomycin  500 mg Oral Q6H    have reviewed scheduled and prn medications.  Physical Exam: General:looks Rose little brighter to me- but still very weak-  Heart: tachy Lungs: mostly clear Abdomen: soft, non tender Extremities: pitting edema throughout including scrotum    05/23/2016,11:17 AM  LOS: 11 days

## 2016-05-24 DIAGNOSIS — R5381 Other malaise: Secondary | ICD-10-CM

## 2016-05-24 LAB — CBC
HCT: 29 % — ABNORMAL LOW (ref 39.0–52.0)
Hemoglobin: 9.9 g/dL — ABNORMAL LOW (ref 13.0–17.0)
MCH: 27.9 pg (ref 26.0–34.0)
MCHC: 34.1 g/dL (ref 30.0–36.0)
MCV: 81.7 fL (ref 78.0–100.0)
Platelets: 235 10*3/uL (ref 150–400)
RBC: 3.55 MIL/uL — AB (ref 4.22–5.81)
RDW: 21 % — ABNORMAL HIGH (ref 11.5–15.5)
WBC: 10.4 10*3/uL (ref 4.0–10.5)

## 2016-05-24 LAB — RENAL FUNCTION PANEL
ALBUMIN: 1.7 g/dL — AB (ref 3.5–5.0)
ANION GAP: 8 (ref 5–15)
BUN: 65 mg/dL — ABNORMAL HIGH (ref 6–20)
CALCIUM: 7.2 mg/dL — AB (ref 8.9–10.3)
CO2: 23 mmol/L (ref 22–32)
Chloride: 104 mmol/L (ref 101–111)
Creatinine, Ser: 3.58 mg/dL — ABNORMAL HIGH (ref 0.61–1.24)
GFR calc non Af Amer: 16 mL/min — ABNORMAL LOW (ref >60)
GFR, EST AFRICAN AMERICAN: 18 mL/min — AB (ref >60)
Glucose, Bld: 176 mg/dL — ABNORMAL HIGH (ref 65–99)
PHOSPHORUS: 5.2 mg/dL — AB (ref 2.5–4.6)
Potassium: 2.4 mmol/L — CL (ref 3.5–5.1)
SODIUM: 135 mmol/L (ref 135–145)

## 2016-05-24 LAB — GLUCOSE, CAPILLARY
GLUCOSE-CAPILLARY: 180 mg/dL — AB (ref 65–99)
Glucose-Capillary: 180 mg/dL — ABNORMAL HIGH (ref 65–99)
Glucose-Capillary: 129 mg/dL — ABNORMAL HIGH (ref 65–99)

## 2016-05-24 NOTE — Progress Notes (Signed)
Pt. Returned from hemodialysis. Nurse went to give patient medications and patient stated "I'm done." When asked to clarify what he meant by his statement, Patient stated "I'm done with living." Nurse asked if there was anything patient needed or wanted but patient denied any needs. MD notified.

## 2016-05-24 NOTE — Progress Notes (Signed)
HD tx completed w/ bp and HR issues throughout most of tx, pt also had a critical K+ level from pre tx lab draw, Dr. Moshe Cipro made aware of all issues as noted on flowsheet, UF goal not met, blood rinsed back, report called to primary nurse Alex Gardener, RN

## 2016-05-24 NOTE — Progress Notes (Signed)
Subjective:  Seen in  HD UOP not recorded but he says he is passing.  His BUN was down to 50's, now 60's- he says nausea is a little better  Objective Vital signs in last 24 hours: Vitals:   05/24/16 0917 05/24/16 0925 05/24/16 0930 05/24/16 1000  BP: 120/73 (!) 141/68 136/76 (!) 111/55  Pulse: (!) 102 97 97 (!) 117  Resp: 16 14 12 10   Temp: 97.8 F (36.6 C)     TempSrc: Oral     SpO2: 97% 99% 98% 97%  Weight: 87.4 kg (192 lb 10.9 oz)     Height:       Weight change:   Intake/Output Summary (Last 24 hours) at 05/24/16 1033 Last data filed at 05/24/16 0615  Gross per 24 hour  Intake              370 ml  Output                4 ml  Net              366 ml    Assessment/ Plan: Pt is a 71 y.o. yo male with baseline MM and creatinine in the mid 1's.  who was admitted on 05/11/2016 with C diff colitis- had contrasted CT.  Course complicated by AKI  Assessment/Plan: 1. Renal- creatinine 5/5 was 1.67 but has worsened since he has been in house.  I was hoping he was showing some signs of improvement but unfortunately it has not continued-  patient seemed clinically uremic.  It is difficult for me to tell if he is miserable from C diff or uremia but made the decision to have him undergo dialysis. IR placed vascath 5/15, then  First HD 5/16, second 5/17, third 5/19 to correct the uremia and also the volume overload.  Given baseline crt good would hope only temporary and not permanent.  I also have decreased valcyte to more appropriate dose based on renal function. He is voiding without foley, hopefully as we get volume off this will improve as his penile swelling will come down.  2. HTN/vol- overloaded- BP marginal  - on lopressor for tachycardia have stopped lasix as is starting HD- third spacing due to low albumin  3. Anemia- due to CKD and multiple myeloma- hgb 11.2>10.4- supportive care  4. Metabolic acidosis- on oral bicarb- will correct further with HD- have stopped pills  5.C Diff- still  symptomatic- on oral vanc and flagyl  6. Hyperphos- treat only with HD for now 7. Nausea- due to c diff, meds or uremia- if due to uremia should be getting better- is really limiting his improvement     Teniyah Seivert A    Labs: Basic Metabolic Panel:  Recent Labs Lab 05/22/16 0422 05/23/16 0721 05/24/16 1009  NA 136 137 135  K 3.4* 3.1* 2.4*  CL 101 104 104  CO2 23 23 23   GLUCOSE 217* 133* 176*  BUN 82* 58* 65*  CREATININE 4.10* 3.41* 3.58*  CALCIUM 7.2* 7.2* 7.2*  PHOS 7.0* 5.1* 5.2*   Liver Function Tests:  Recent Labs Lab 05/22/16 0422 05/23/16 0721 05/24/16 1009  ALBUMIN 1.7* 1.7* 1.7*   No results for input(s): LIPASE, AMYLASE in the last 168 hours. No results for input(s): AMMONIA in the last 168 hours. CBC:  Recent Labs Lab 05/19/16 0700 05/20/16 0624 05/21/16 0800 05/22/16 0422 05/23/16 0721  WBC 12.7* 11.6* 10.5 12.5* 12.9*  NEUTROABS  --  9.8*  --   --   --  HGB 11.7* 11.5* 10.9* 11.2* 10.4*  HCT 32.1* 32.6* 30.9* 31.6* 29.9*  MCV 79.3 79.3 79.0 80.6 81.0  PLT 323 345 350 297 260   Cardiac Enzymes: No results for input(s): CKTOTAL, CKMB, CKMBINDEX, TROPONINI in the last 168 hours. CBG:  Recent Labs Lab 05/23/16 0934 05/23/16 1228 05/23/16 1801 05/23/16 2108 05/24/16 0755  GLUCAP 129* 111* 185* 248* 180*    Iron Studies: No results for input(s): IRON, TIBC, TRANSFERRIN, FERRITIN in the last 72 hours. Studies/Results: No results found. Medications: Infusions:   Scheduled Medications: . acyclovir  200 mg Oral Daily  . atorvastatin  20 mg Oral Daily  . famotidine  20 mg Oral Daily  . feeding supplement (NEPRO CARB STEADY)  237 mL Oral BID BM  . feeding supplement (PRO-STAT SUGAR FREE 64)  30 mL Oral BID  . heparin subcutaneous  5,000 Units Subcutaneous Q8H  . insulin aspart  0-9 Units Subcutaneous TID WC  . insulin detemir  20 Units Subcutaneous QHS  . lactobacillus acidophilus  2 tablet Oral TID  . metoprolol  succinate  75 mg Oral Daily  . saccharomyces boulardii  250 mg Oral BID  . vancomycin  500 mg Oral Q6H    have reviewed scheduled and prn medications.  Physical Exam: General:looks a little brighter to me- but still very weak-  Heart: tachy Lungs: mostly clear Abdomen: soft, non tender Extremities: pitting edema throughout including scrotum    05/24/2016,10:33 AM  LOS: 12 days

## 2016-05-24 NOTE — Progress Notes (Signed)
[facility] PROGRESS NOTE  Karyl Kinnier. KYH:[id number] DOB: 27-Jan-1945 DOA: 05/11/2016 PCP: [name], MD  Interim summary and history of present illness 71 year old male with history of multiple myeloma on chemotherapy and recent mandibular abscess which was treated with clindamycin; presented to the emergency department secondary to crampy abdominal pain and multiple episode of diarrhea. Patient was found to have sepsis due to C. difficile colitis. His course has been now complicated with acute on chronic renal failure and oliguria requiring hemodialysis.  Assessment/Plan: 1-sepsis secondary to C. difficile colitis -Patient afebrile, WBC's within normal limits now and report improvement in the amount of loose stools episodes.  -Still feeling slightly nauseous and sick stomach (but with improvement as he compared it to yesterday). -Continue florastor and continue Pepcid -Sepsis features essentially resolved. -Will supportive care and follow clinical response  2-sinus tachycardia: Appears to be secondary to rebound tachycardia -Will continue treatment with metoprolol  -Patient denies chest pain, shortness of breath or palpitations.  -Continue monitoring on telemetry -Continue Electrolytes repletion as needed  3-diabetes mellitus type 2 with hyperglycemia -Continue monitoring CBGs  -Continue sliding scale insulin and Lantus  -Further adjustment to hypoglycemic regimen as needed, base on his CBGs fluctuation.  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 First hemodialysis treatment 5/16, second treatment 5/17, third treatment 5/19. -patient volume status, uremia and Cr level improved with HD -will follow renal service recommendations   5-paraphimosis -Continue slowly improving as his volume status optimized   -Urology has seen patient, mild reproduction and Foley discontinuation recommended.  -will monitor.  6-hypertension -Overall stable. -Will continue to monitor vital signs and adjust medication as needed. -Continue current dose of metoprolol.  7-metabolic acidosis: Secondary to renal failure -Stable/improved after initiation of hemodialysis -Sodium bicarbonate discontinue at this moment as per renal service recommendations   8-hypoalbuminemia with severe protein calorie malnutrition -Patient appetite continue to be poor  -Will continue feeding supplements  -Encourage by mouth intake .family has been discussing/inquiring about feeding tube placement for nutrition purposes; but the patient is no enthusiastic/optimistic about it. -Will follow clinical response continue supportive care. -Palliative care has been consulted for assistance with advanced directives and GOC  9-anemia/thrombocytopenia: Due to chronic kidney disease and chemotherapy given for his multiple myeloma. -Has remained stable.  -No transfusion needed  -Continue intermittent monitoring and supportive care.   10-hypomagnesemia -Will monitor trend and replete as needed.   11-MM  -Patient will continue outpatient treatment/follow up with oncology services at discharge  -Condition is not in remission; and he was actively receiving chemotherapy as an outpatient.  -Will continue the use of Zovirax prophylactically.    12-hypokalemia -Repleted/adjusted with hemodialysis. We'll follow levels in a.m. -Continue monitoring on telemetry  Code Status: Full code Family Communication: Wife at bedside Disposition Plan: Patient will most likely require skilled nursing facility at discharge for rehabilitation. Will follow renal service recommendations patient is currently requiring hemodialysis. Continue treatment for C. difficile colitis. Will consult palliative care for assistance with goals of care and advanced directives.     Consultants:  Nephrology  Urology  IR   Procedures:  See below for x-ray reports  First Hemodialysis 5/16; second hemodialysis 5/17 and third hemodialysis today (5/19).  Antibiotics:  Vancomycin  05/12/16  Metronidazole  05/13/16  Zovirax   HPI/Subjective: Patient reports feeling slightly better today and with significant improvement in the nausea/sickness sensation  on his stomach. Appetite continued to be poor and the patient is very weak and deconditioned.  Objective: Vitals:   05/24/16 1255 05/24/16 1300  BP: 101/75 127/75  Pulse: (!) 116 (!) 113  Resp: 13 14  Temp:  97.1 F (36.2 C)    Intake/Output Summary (Last 24 hours) at 05/24/16 1450 Last data filed at 05/24/16 1255  Gross per 24 hour  Intake              370 ml  Output             1798 ml  Net            -1428 ml   Filed Weights   05/22/16 2334 05/24/16 0917 05/24/16 1300  Weight: 90.3 kg (199 lb 1.2 oz) 87.4 kg (192 lb 10.9 oz) 85.6 kg (188 lb 11.4 oz)    Exam:   General: Patient is afebrile, reports feeling slightly better and denies nausea currently. Appetite continued to be poor. Continue reporting improvement in the number of loose stools per day. Very weak and deconditioned.   Cardiovascular: RRR, S1 and S2, no rubs, no gallop, no murmur    Respiratory: Good air movement bilaterally, no wheezing, no frank crackles, no using accessory muscles. Good oxygen saturation on room air.   Abdomen: Soft, nontender, nondistended, positive bowel sounds.    Musculoskeletal: 1 ++ edema bilaterally (extending all the way to his tights), no cyanosis, no clubbing.     Data Reviewed: Basic Metabolic Panel:  Recent Labs Lab 05/20/16 0624 05/21/16 0800 05/22/16 0422 05/23/16 0721 05/24/16 1009  NA 133*  134* 136 136 137 135  K 3.9  4.0 3.3* 3.4* 3.1* 2.4*  CL 101  102 104 101 104 104  CO2 20*  20* _0 GLUCOSE 229*  231* 184* 217* 133* 176*  BUN 105*  105* 103* 82* 58* 65*   CREATININE 5.66*  5.64* 4.98* 4.10* 3.41* 3.58*  CALCIUM 7.3*  7.4* 7.3* 7.2* 7.2* 7.2*  PHOS 8.3* 7.5* 7.0* 5.1* 5.2*   Liver Function Tests:  Recent Labs Lab 05/20/16 0624 05/21/16 0800 05/22/16 0422 05/23/16 0721 05/24/16 1009  ALBUMIN 1.8* 1.7* 1.7* 1.7* 1.7*   CBC:  Recent Labs Lab 05/20/16 0624 05/21/16 0800 05/22/16 0422 05/23/16 0721 05/24/16 1008  WBC 11.6* 10.5 12.5* 12.9* 10.4  NEUTROABS 9.8*  --   --   --   --   HGB 11.5* 10.9* 11.2* 10.4* 9.9*  HCT 32.6* 30.9* 31.6* 29.9* 29.0*  MCV 79.3 79.0 80.6 81.0 81.7  PLT 345 350 297 260 235   CBG:  Recent Labs Lab 05/23/16 0934 05/23/16 1228 05/23/16 1801 05/23/16 2108 05/24/16 0755  GLUCAP 129* 111* 185* 248* 180*    No results found for this or any previous visit (from the past 240 hour(s)).   Studies: No results found.  Scheduled Meds: . acyclovir  200 mg Oral Daily  . atorvastatin  20 mg Oral Daily  . famotidine  20 mg Oral Daily  . feeding supplement (NEPRO CARB STEADY)  237 mL Oral BID BM  . feeding supplement (PRO-STAT SUGAR FREE 64)  30 mL Oral BID  . heparin subcutaneous  5,000 Units Subcutaneous Q8H  . insulin aspart  0-9 Units Subcutaneous TID WC  . insulin detemir  20 Units Subcutaneous QHS  . lactobacillus acidophilus  2 tablet Oral TID  . metoprolol succinate  75 mg Oral Daily  . saccharomyces boulardii  250 mg Oral BID  .  vancomycin  500 mg Oral Q6H   Continuous Infusions:   Principal Problem:   SIRS (systemic inflammatory response syndrome) (HCC) Active Problems:   Coronary artery disease   Hyperlipidemia   Hypertension   Multiple myeloma (HCC)   Colitis   Type 2 diabetes mellitus with vascular disease ([place])   Sinus tachycardia   AKI (acute kidney injury) ([place])   Enteritis due to Clostridium difficile    Time spent: 25 minutes    Barton Dubois  [facility] Pager [phone]. If 7PM-7AM, please contact night-coverage at [email or web address], password  [facility] 05/24/2016, 2:50 PM  LOS: 12 days

## 2016-05-24 NOTE — Procedures (Signed)
Patient was seen on dialysis and the procedure was supervised.  BFR 400  Via VC BP is  109/68.   Patient appears to be tolerating treatment well  Douglas Rose A 05/24/2016

## 2016-05-24 NOTE — Progress Notes (Signed)
CRITICAL VALUE ALERT  Critical value received: K+ 2.4  Date of notification: 05/24/16  Time of notification: 1022  Critical value read back: yes  Nurse who received alert: Lonn Georgia  MD notified (1st page): Dr. Dyann Kief  Time of first page: 1023  MD notified (2nd page):  Time of second page:  Responding MD: Dr. Dyann Kief  Time MD responded:  1130

## 2016-05-24 NOTE — Progress Notes (Signed)
CRITICAL VALUE ALERT K+ Critical value received:  2.4  Date of notification:  05/24/16  Time of notification:  8264  Critical value read back:yes  Nurse who received alert:  Charlott Rakes, RN then called to HD nurse Jaylena Holloway S. Lavone Neri, RN  MD notified (1st page): Dr. Moshe Cipro  Time of first page:  60   MD notified (2nd page):  Time of second page:  Responding MD:  Dr. Moshe Cipro  Time MD responded: 27 MD on unit

## 2016-05-24 NOTE — Progress Notes (Signed)
Pt arrived to unit A&O w/ flat affect and VSS, HD tx initiated via HD cath, AP: pull/push/flush w/o problem but VP: no pull, push/flush well, will cont to monitor while on HD tx

## 2016-05-25 DIAGNOSIS — R1084 Generalized abdominal pain: Secondary | ICD-10-CM

## 2016-05-25 DIAGNOSIS — D72829 Elevated white blood cell count, unspecified: Secondary | ICD-10-CM

## 2016-05-25 DIAGNOSIS — Z7189 Other specified counseling: Secondary | ICD-10-CM

## 2016-05-25 LAB — GLUCOSE, CAPILLARY
Glucose-Capillary: 151 mg/dL — ABNORMAL HIGH (ref 65–99)
Glucose-Capillary: 197 mg/dL — ABNORMAL HIGH (ref 65–99)
Glucose-Capillary: 210 mg/dL — ABNORMAL HIGH (ref 65–99)
Glucose-Capillary: 217 mg/dL — ABNORMAL HIGH (ref 65–99)

## 2016-05-25 NOTE — Progress Notes (Signed)
[facility] PROGRESS NOTE  Douglas Rose. [id number]:[id number] DOB: 1945-07-23 DOA: 05/11/2016 PCP: [name], MD  Interim summary and history of present illness 71 year old male with history of multiple myeloma on chemotherapy and recent mandibular abscess which was treated with clindamycin; presented to the emergency department secondary to crampy abdominal pain and multiple episode of diarrhea. Patient was found to have sepsis due to C. difficile colitis. His course has been now complicated with acute on chronic renal failure and oliguria requiring hemodialysis.  Assessment/Plan: 1-sepsis secondary to C. difficile colitis -Patient with resolution of sepsis features  -Will continue treatment with oral vancomycin  -Reports stools are forming and he is having less episodes per day  -Continue florastor -Continue Pepcid; avoiding PPIs will actively treating C. Diff -We'll continue supportive care and adequate hydration.  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 -Patient has had a total of 3 hemodialysis treatments 04 (last one 5/19).  -Per nephrology recommendations we will perform a fourth treatment on 5/21st - significant improvement of his volume status and also on his creatinine/BUN level after dialysis.   5-paraphimosis -Continue improving as his volume status improves -foley removed, seen by urology and is passing urine.  6-hypertension -Stable  and controlled -Will continue the use of metoprolol.  7-metabolic acidosis: Secondary to renal failure -Continue to be managed with hemodialysis now -Will follow bicarbonate levels  8-hypoalbuminemia with severe protein calorie malnutrition -Even his appetite continued to be poor, patient expressed feeling better a little looking to increase by mouth intake. -Will continue feeding supplements  9-anemia/thrombocytopenia: Due to chronic kidney disease and chemotherapy given for his multiple myeloma. -Stable overall -No transition needed -Will follow hemoglobin trend  10-hypomagnesemia -Will follow trend intermittently and replete as needed.  11-MM  -Further treatment to be decided by his oncologist  -outpatient follow-up after discharge -Condition is not in remission   12-hypokalemia -Will follow electrolytes and replete as needed  -Patient with adjusted potassium during dialysis treatment on 05/24/2016  -Continue monitoring him on telemetry  Code Status: Full code Family Communication: Wife at bedside Disposition Plan: Will need skilled nursing facility for rehabilitation and conditioning at discharge; will follow recommendations from renal service regarding further hemodialysis need; continue vancomycin for C. difficile colitis.   Consultants:  Nephrology  Urology  IR   Procedures:  See below for x-ray reports  First Hemodialysis 5/16; second hemodialysis 5/17 and third hemodialysis today (5/19).  Antibiotics:  Vancomycin  05/12/16  Metronidazole  05/13/16  Zovirax   HPI/Subjective: Feeling significantly better, no chest pain, no shortness of breath. Fluid overload status is improving after hemodialysis treatments and the patient endorses no sickness on his stomach or further nausea.  Objective: Vitals:   05/25/16 0638 05/25/16 0930  BP: 119/60 103/60  Pulse: 100 98  Resp: 18   Temp: 98.5 F (36.9 C)     Intake/Output Summary (Last 24 hours) at  05/25/16 1454 Last data filed at 05/25/16 2585  Gross per 24 hour  Intake              120 ml  Output                0 ml  Net              120 ml   Filed Weights   05/22/16 2334 05/24/16 0917 05/24/16 1300  Weight: 90.3 kg (199 lb 1.2 oz) 87.4 kg (192 lb 10.9 oz) 85.6 kg (188 lb 11.4 oz)    Exam:   General: Patient is feeling much better today and denies chest pain and shortness of breath. Also with improved mood and endorsing improvement in his nausea/sick tummy feelings. Appetite continue to be poor. Patient reports increase in urine output.  Cardiovascular: Regular rate and rhythm, no rubs, no gallops, no murmur.   Respiratory: Improved air movement bilaterally, no wheezing, no crackles, no using accessory muscles.   Abdomen: Soft, nontender, nondistended, positive bowel sounds.     Musculoskeletal: 1+ edema bilaterally, no cyanosis or clubbing.   Data Reviewed: Basic Metabolic Panel:  Recent Labs Lab 05/20/16 0624 05/21/16 0800 05/22/16 0422 05/23/16 0721 05/24/16 1009  NA 133*  134* 136 136 137 135  K 3.9  4.0 3.3* 3.4* 3.1* 2.4*  CL 101  102 104 101 104 104  CO2 20*  20* _0 GLUCOSE 229*  231* 184* 217* 133* 176*  BUN 105*  105* 103* 82* 58* 65*  CREATININE 5.66*  5.64* 4.98* 4.10* 3.41* 3.58*  CALCIUM 7.3*  7.4* 7.3* 7.2* 7.2* 7.2*  PHOS 8.3* 7.5* 7.0* 5.1* 5.2*   Liver Function Tests:  Recent Labs Lab 05/20/16 0624 05/21/16 0800 05/22/16 0422 05/23/16 0721 05/24/16 1009  ALBUMIN 1.8* 1.7* 1.7* 1.7* 1.7*   CBC:  Recent Labs Lab 05/20/16 0624 05/21/16 0800 05/22/16 0422 05/23/16 0721 05/24/16 1008  WBC 11.6* 10.5 12.5* 12.9* 10.4  NEUTROABS 9.8*  --   --   --   --   HGB 11.5* 10.9* 11.2* 10.4* 9.9*  HCT 32.6* 30.9* 31.6* 29.9* 29.0*  MCV 79.3 79.0 80.6 81.0 81.7  PLT 345 350 297 260 235   CBG:  Recent Labs Lab 05/24/16 0755 05/24/16 1641 05/24/16 2137 05/25/16 0808 05/25/16 1230  GLUCAP 180* 129* 180* 151* 197*     No results found for this or any previous visit (from the past 240 hour(s)).   Studies: No results found.  Scheduled Meds: . acyclovir  200 mg Oral Daily  . atorvastatin  20 mg Oral Daily  . famotidine  20 mg Oral Daily  . feeding supplement (NEPRO CARB STEADY)  237 mL Oral BID BM  . feeding supplement (PRO-STAT SUGAR FREE 64)  30 mL Oral BID  . heparin subcutaneous  5,000 Units Subcutaneous Q8H  . insulin aspart  0-9 Units Subcutaneous TID WC  . insulin detemir  20 Units Subcutaneous QHS  . lactobacillus acidophilus  2 tablet Oral TID  . metoprolol succinate  75 mg Oral Daily  . saccharomyces boulardii  250 mg Oral BID  . vancomycin  500 mg Oral Q6H   Continuous Infusions:   Principal Problem:   SIRS (systemic inflammatory response syndrome) (HCC) Active Problems:   Coronary artery disease   Hyperlipidemia   Hypertension   Multiple myeloma (HCC)   Colitis   Type 2 diabetes mellitus with vascular disease ([place])   Sinus tachycardia   AKI (acute kidney injury) ([place])   Enteritis due to Clostridium difficile    Time spent: 25 minutes    Barton Dubois  [facility] Pager [phone]. If 7PM-7AM, please  contact night-coverage at [email or web address], password [facility] 05/25/2016, 2:54 PM  LOS: 13 days

## 2016-05-25 NOTE — Progress Notes (Signed)
Subjective:  Looks much better still today.   Had HD yest- removed 1700 tolerated well  Objective Vital signs in last 24 hours: Vitals:   05/24/16 1255 05/24/16 1300 05/24/16 2133 05/25/16 0638  BP: 101/75 127/75 121/61 119/60  Pulse: (!) 116 (!) 113 (!) 108 100  Resp: _0 Temp:  97.1 F (36.2 C) 99.4 F (37.4 C) 98.5 F (36.9 C)  TempSrc:  Oral    SpO2: 100% 100% 100% 100%  Weight:  85.6 kg (188 lb 11.4 oz)    Height:       Weight change:   Intake/Output Summary (Last 24 hours) at 05/25/16 0851 Last data filed at 05/25/16 0608  Gross per 24 hour  Intake              120 ml  Output             1794 ml  Net            -1674 ml    Assessment/ Plan: Pt is a 71 y.o. yo male with baseline MM and creatinine in the mid 1's.  who was admitted on 05/11/2016 with C diff colitis- had contrasted CT.  Course complicated by AKI  Assessment/Plan: 1. Renal- creatinine 5/5 was 1.67 but has worsened since he has been in house.  I was hoping he was showing some signs of improvement but unfortunately it has not continued-  patient seemed clinically uremic.  It is difficult for me to tell if he is miserable from C diff or uremia but made the decision to have him undergo dialysis. IR placed vascath 5/15, then  First HD 5/16, second 5/17, third 5/19 to correct the uremia and also the volume overload.  He has clinically improved. Given baseline crt good would hope only temporary and not permanent.  I also have decreased valcyte to more appropriate dose based on renal function. He is voiding without foley, hopefully as we get volume off this will improve as his penile swelling will come down. Will plan for 4th HD on Monday  2. HTN/vol- overloaded- BP marginal  - on lopressor for tachycardia have stopped lasix as is starting HD- third spacing due to low albumin- weight dec since starting HD  3. Anemia- due to CKD and multiple myeloma- hgb 11.2>10.4>9.9- supportive care  4. Metabolic acidosis- on oral  bicarb- will correct further with HD- have stopped pills  5.C Diff- still symptomatic- on oral vanc and flagyl  6. Hyperphos- treat only with HD for now 7. Nausea- due to c diff, meds or uremia- if due to uremia should be getting better- is really limiting his improvement 8. Hypokalemia- dialyzed on a 4 K bath the whole time yesterday- the 2.4 was pre HD     Amillion [place]obee A    Labs: Basic Metabolic Panel:  Recent Labs Lab 05/22/16 0422 05/23/16 0721 05/24/16 1009  NA 136 137 135  K 3.4* 3.1* 2.4*  CL 101 104 104  CO2 _1 GLUCOSE 217* 133* 176*  BUN 82* 58* 65*  CREATININE 4.10* 3.41* 3.58*  CALCIUM 7.2* 7.2* 7.2*  PHOS 7.0* 5.1* 5.2*   Liver Function Tests:  Recent Labs Lab 05/22/16 0422 05/23/16 0721 05/24/16 1009  ALBUMIN 1.7* 1.7* 1.7*   No results for input(s): LIPASE, AMYLASE in the last 168 hours. No results for input(s): AMMONIA in the last 168 hours. CBC:  Recent Labs Lab 05/20/16 0624 05/21/16 0800 05/22/16 0422 05/23/16 0721 05/24/16 1008  WBC 11.6* 10.5 12.5* 12.9* 10.4  NEUTROABS 9.8*  --   --   --   --   HGB 11.5* 10.9* 11.2* 10.4* 9.9*  HCT 32.6* 30.9* 31.6* 29.9* 29.0*  MCV 79.3 79.0 80.6 81.0 81.7  PLT 345 350 297 260 235   Cardiac Enzymes: No results for input(s): CKTOTAL, CKMB, CKMBINDEX, TROPONINI in the last 168 hours. CBG:  Recent Labs Lab 05/23/16 2108 05/24/16 0755 05/24/16 1641 05/24/16 2137 05/25/16 0808  GLUCAP 248* 180* 129* 180* 151*    Iron Studies: No results for input(s): IRON, TIBC, TRANSFERRIN, FERRITIN in the last 72 hours. Studies/Results: No results found. Medications: Infusions:   Scheduled Medications: . acyclovir  200 mg Oral Daily  . atorvastatin  20 mg Oral Daily  . famotidine  20 mg Oral Daily  . feeding supplement (NEPRO CARB STEADY)  237 mL Oral BID BM  . feeding supplement (PRO-STAT SUGAR FREE 64)  30 mL Oral BID  . heparin subcutaneous  5,000 Units Subcutaneous Q8H  . insulin  aspart  0-9 Units Subcutaneous TID WC  . insulin detemir  20 Units Subcutaneous QHS  . lactobacillus acidophilus  2 tablet Oral TID  . metoprolol succinate  75 mg Oral Daily  . saccharomyces boulardii  250 mg Oral BID  . vancomycin  500 mg Oral Q6H    have reviewed scheduled and prn medications.  Physical Exam: General:looks a little brighter to me still - vascath placed 5/15 Heart: tachy Lungs: mostly clear Abdomen: soft, non tender Extremities: pitting edema throughout but improved     05/25/2016,8:51 AM  LOS: 13 days

## 2016-05-25 NOTE — Consult Note (Signed)
Consultation Note Date: 05/25/2016   Patient Name: Douglas Rose.  DOB: Jun 03, 1945  MRN: [id number]  Age / Sex: 71 y.o., male  PCP: [name], MD Referring Physician: Barton Dubois, MD  Reason for Consultation: Establishing goals of care and Psychosocial/spiritual support  HPI/Patient Profile: 71 y.o. male admitted on 05/11/2016 with past medical history of multiple myeloma recently diagnosed only two months ago (dR Irene Limbo is oncologist)  last chemotherapy 3 days ago and recently placed on clindamycin for mandibular abscess presents to the ER with complaints of diffuse crampy abdominal pain with multiple episodes of diarrhea.   In the ER patient is found to be tachycardic with leukocytosis. Patient blood pressure was in the low normal.CT scan shows diffuse colitis concerning for C. difficile. C. difficile panel and GI pathogen panel are pending. Patient is admitted for SIRS from diffuse colitis.  Per attending the patient has been struggling with decision regarding continued  treatments and side effects, "this past week has been rough".  His family are very much hoping that treatment will offer more quality time.  They want him "to fight".    Clinical Assessment and Goals of Care:  This NP Wadie Lessen reviewed medical records, received report from team, assessed the patient and then meet at the patient's bedside along with his wife  to discuss diagnosis, prognosis, and GOCs.  A  discussion was had today regarding advanced directives.  Concepts specific to code status were detailed.   The difference between a aggressive medical intervention path  and a palliative comfort care path for this patient at this time was had.  Values and goals of care important to patient and family were attempted to be elicited.   Today both patient and his wife verbalize hope for improvement, and decision to to "whatever  it takes".  However the patient was able to let his wife know that decisions related to treatment and continuance of are ultimately his decsion.    Mrs Sthilaire acknowledge this.  Concept of Palliative Care was discussed  Questions and concerns addressed.   Family encouraged to call with questions or concerns.  PMT will continue to support holistically.  No documented Advanced directive or HPOA.  Wife is main support person    SUMMARY OF RECOMMENDATIONS    Code Status/Advance Care Planning:  Full code Encouraged to consider DNR/DNI status 2/2 to multiple co-morbid ites and known poor outcomes in similar patients.   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  Prognosis:   Unable to determine  Discharge Planning: To Be Determined      Primary Diagnoses: Present on Admission: . Colitis . SIRS (systemic inflammatory response syndrome) (HCC) . Multiple myeloma ([place]) . Coronary artery disease . Hypertension . Hyperlipidemia . Type 2 diabetes mellitus with vascular disease ([place])   I have  reviewed the medical record, interviewed the patient and family, and examined the patient. The following aspects are pertinent.  Past Medical History:  Diagnosis Date  . CAD (coronary artery disease) 2006   stent LAD 3.0x18 [place], occluded RCA  . Cancer ([place])    multiple myeloma  . Diabetes mellitus without complication ([place])   . H/O non-insulin dependent diabetes mellitus   . Hyperlipidemia   . Hypertension    Social History   Social History  . Marital status: Married    Spouse name: N/A  . Number of children: 6  . Years of education: N/A   Occupational History  . retired Company secretary    Social  History Main Topics  . Smoking status: Never Smoker  . Smokeless tobacco: Never Used  . Alcohol use No  . Drug use: No  . Sexual activity: Not Currently   Other Topics Concern  . None   Social History Narrative  . None   Family History  Problem Relation Age of Onset  . Arrhythmia Mother   . Diabetes Sister   . Diabetes Brother   . Diabetes Brother    Scheduled Meds: . acyclovir  200 mg Oral Daily  . atorvastatin  20 mg Oral Daily  . famotidine  20 mg Oral Daily  . feeding supplement (NEPRO CARB STEADY)  237 mL Oral BID BM  . feeding supplement (PRO-STAT SUGAR FREE 64)  30 mL Oral BID  . heparin subcutaneous  5,000 Units Subcutaneous Q8H  . insulin aspart  0-9 Units Subcutaneous TID WC  . insulin detemir  20 Units Subcutaneous QHS  . lactobacillus acidophilus  2 tablet Oral TID  . metoprolol succinate  75 mg Oral Daily  . saccharomyces boulardii  250 mg Oral BID  . vancomycin  500 mg Oral Q6H   Continuous Infusions: PRN Meds:.acetaminophen **OR** acetaminophen, gi cocktail, heparin, HYDROmorphone (DILAUDID) injection, lidocaine (PF), metoprolol tartrate, nitroGLYCERIN, ondansetron **OR** ondansetron (ZOFRAN) IV, sodium chloride flush Medications Prior to Admission:  Prior to Admission medications   Medication Sig Start Date End Date Taking? Authorizing Provider  acyclovir (ZOVIRAX) 200 MG capsule Take 1 capsule (200 mg total) by mouth 2 (two) times daily. 04/08/16  Yes Brunetta Genera, MD  amLODipine (NORVASC) 10 MG tablet Take 10 mg by mouth daily.   Yes [provider]  aspirin 81 MG tablet Take 81 mg by mouth daily.   Yes [provider]  atorvastatin (LIPITOR) 20 MG tablet Take 1 tablet (20 mg total) by mouth daily. 12/06/12  Yes Burtis Junes, NP  chlorhexidine (PERIDEX) 0.12 % solution Use as directed 15 mLs in the mouth or throat 2 (two) times daily. 04/15/16  Yes Brunetta Genera, MD  cyclophosphamide (CYTOXAN) 50 MG tablet Take 12 tablets  (600 mg total) by mouth once a week. Take days 1,8,15 of chemo 1hr before or 2hr after meals every 21d. Capsules. ICD:C90.00 04/03/16  Yes Brunetta Genera, MD  dexamethasone (DECADRON) 4 MG tablet Take 5 tablets (20 mg) on days 1, 8, and 15 of chemo. Repeat every 21 days. Take 2 tablets (31m) daily for 2 days after days1 and 8 of chemo 03/31/16  Yes KBrunetta Genera MD  docusate sodium (COLACE) 100 MG capsule Take 1 capsule (100 mg total) by mouth every 12 (twelve) hours. 05/11/16  Yes WRipley Fraise MD  insulin aspart (NOVOLOG) 100 UNIT/ML FlexPen Three times a day before meals per sliding scale. between 2-6 on average 04/09/16  Yes [provider]  Insulin Glargine (LANTUS SOLOSTAR) 100 UNIT/ML Solostar Pen Inject 5 Units into the skin daily.  03/25/16  Yes [provider]  metoprolol succinate (TOPROL-XL) 25 MG 24 hr tablet Take 3 tablets (75 mg total) by mouth daily. Take with or immediately following a meal. 03/05/16  Yes Velna Ochs, MD  nitroGLYCERIN (NITROSTAT) 0.4 MG SL tablet Place 1 tablet (0.4 mg total) under the tongue every 5 (five) minutes x 3 doses as needed for chest pain. 11/13/12  Yes Edmisten, Brooke O, PA-C  ondansetron (ZOFRAN) 8 MG tablet Take 1 tablet (8 mg total) by mouth 2 (two) times daily as needed for refractory nausea / vomiting. 03/31/16  Yes Brunetta Genera, MD  prochlorperazine (COMPAZINE) 10 MG tablet Take 1 tablet (10 mg total) by mouth every 6 (six) hours as needed (Nausea or vomiting). 03/31/16  Yes Brunetta Genera, MD  tamsulosin (FLOMAX) 0.4 MG CAPS capsule Take 1 capsule (0.4 mg total) by mouth daily after supper. 03/04/16  Yes Velna Ochs, MD   No Known Allergies Review of Systems  Constitutional: Positive for fatigue.  Gastrointestinal: Positive for diarrhea.  Neurological: Positive for weakness.    Physical Exam  Constitutional: He appears cachectic. He appears ill.  Cardiovascular: Tachycardia present.     Pulmonary/Chest: Effort normal and breath sounds normal.  Skin: Skin is warm and dry.    Vital Signs: BP 103/60   Pulse 98   Temp 98.5 F (36.9 C)   Resp 18   Ht 5' 11"  (1.803 m)   Wt 85.6 kg (188 lb 11.4 oz)   SpO2 100%   BMI 26.32 kg/m  Pain Assessment: No/denies pain   Pain Score: 0-No pain   SpO2: SpO2: 100 % O2 Device:SpO2: 100 % O2 Flow Rate: .   IO: Intake/output summary:  Intake/Output Summary (Last 24 hours) at 05/25/16 1427 Last data filed at 05/25/16 0608  Gross per 24 hour  Intake              120 ml  Output                0 ml  Net              120 ml    LBM: Last BM Date: 05/25/16 Baseline Weight: Weight: 76.2 kg (168 lb) Most recent weight: Weight: 85.6 kg (188 lb 11.4 oz)     Palliative Assessment/Data: 30 %    Discussed with Dr Dyann Kief and Bedside RN  Time In: 1300 Time Out: 1430 Time Total: 75 min Greater than 50%  of this time was spent counseling and coordinating care related to the above assessment and plan.  Signed by: Wadie Lessen, NP   Please contact Palliative Medicine Team phone at [phone] for questions and concerns.  For individual provider: See Shea Evans

## 2016-05-26 LAB — GLUCOSE, CAPILLARY
GLUCOSE-CAPILLARY: 110 mg/dL — AB (ref 65–99)
Glucose-Capillary: 89 mg/dL (ref 65–99)
Glucose-Capillary: 200 mg/dL — ABNORMAL HIGH (ref 65–99)

## 2016-05-26 LAB — CBC
HCT: 28.3 % — ABNORMAL LOW (ref 39.0–52.0)
Hemoglobin: 9.8 g/dL — ABNORMAL LOW (ref 13.0–17.0)
MCH: 28.7 pg (ref 26.0–34.0)
MCHC: 34.6 g/dL (ref 30.0–36.0)
MCV: 83 fL (ref 78.0–100.0)
PLATELETS: 179 10*3/uL (ref 150–400)
RBC: 3.41 MIL/uL — ABNORMAL LOW (ref 4.22–5.81)
RDW: 21.3 % — AB (ref 11.5–15.5)
WBC: 7.8 10*3/uL (ref 4.0–10.5)

## 2016-05-26 LAB — RENAL FUNCTION PANEL
Albumin: 1.7 g/dL — ABNORMAL LOW (ref 3.5–5.0)
Anion gap: 9 (ref 5–15)
BUN: 50 mg/dL — AB (ref 6–20)
CO2: 22 mmol/L (ref 22–32)
CREATININE: 2.94 mg/dL — AB (ref 0.61–1.24)
Calcium: 7.1 mg/dL — ABNORMAL LOW (ref 8.9–10.3)
Chloride: 104 mmol/L (ref 101–111)
GFR, EST AFRICAN AMERICAN: 23 mL/min — AB (ref >60)
GFR, EST NON AFRICAN AMERICAN: 20 mL/min — AB (ref >60)
Glucose, Bld: 114 mg/dL — ABNORMAL HIGH (ref 65–99)
PHOSPHORUS: 4.3 mg/dL (ref 2.5–4.6)
Sodium: 135 mmol/L (ref 135–145)

## 2016-05-26 LAB — MAGNESIUM: MAGNESIUM: 2.2 mg/dL (ref 1.7–2.4)

## 2016-05-26 MED ORDER — POTASSIUM CHLORIDE CRYS ER 20 MEQ PO TBCR
20.0000 meq | EXTENDED_RELEASE_TABLET | Freq: Once | ORAL | Status: AC
  Administered 2016-05-26: 20 meq via ORAL
  Filled 2016-05-26: qty 1

## 2016-05-26 MED ORDER — HEPARIN SODIUM (PORCINE) 1000 UNIT/ML DIALYSIS: 20.0000 [IU]/kg | INTRAMUSCULAR | Status: DC | PRN

## 2016-05-26 NOTE — Progress Notes (Signed)
CSW checked in with patient's wife regarding SNF. CSW provided SNF list, but she is still not certain she wants SNF. She will let CSW know.  Douglas Rose Douglas Rose LCSWA [phone]

## 2016-05-26 NOTE — Progress Notes (Signed)
   05/26/16 0900  PT Visit Information  Last PT Received On 05/26/16  Reason Eval/Treat Not Completed Medical issues which prohibited therapy   Pt has very low K+ and is in HD, will check later as pt and time allow.  Mee Hives, PT MS Acute Rehab Dept. Number: [place] and [place]

## 2016-05-26 NOTE — Progress Notes (Signed)
[facility] PROGRESS NOTE  Douglas Rose. VQM:[id number] DOB: March 02, 1945 DOA: 05/11/2016 PCP: [name], MD  Interim summary and history of present illness 71 year old male with history of multiple myeloma on chemotherapy and recent mandibular abscess which was treated with clindamycin; presented to the emergency department secondary to crampy abdominal pain and multiple episode of diarrhea. Patient was found to have sepsis due to C. difficile colitis. His course has been now complicated with acute on chronic renal failure and oliguria requiring hemodialysis.  Assessment/Plan: 1-sepsis secondary to C. difficile colitis -sepsis features. Resolved. -has now completed 14 days of oral vancomycin and 10 days of IV flagyl -stools getting more form -will continue florastor -Continue Pepcid. At this moment will continue holding treatment of reflux with PPIs. -Will continue supportive care and continue motivating for adequate hydration/oral intake.   2-sinus tachycardia: Appears to be secondary to rebound tachycardia -No chest pain, no palpitations, no shortness of breath. -Will continue metoprolol.  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 -Will follow nephrology recommendations -Has currently received 4 hemodialysis treatments. -Patient has chronic renal failure due to multiple myeloma and diabetes. Condition got worse due to hypotension from acute sepsis and also use of contrast. -Will minimize/avoid nephrotoxic agents as much as possible.  5-paraphimosis -Continue improving as his penis swelling disappears -Reports that his able to pass more amount of urine -No dysuria -Patient has been seen by urologist; manual reduction was performed  and 40 catheter discontinue.  6-hypertension -Blood pressure is well-controlled  -Continue metoprolol  7-metabolic acidosis: Secondary to renal failure -Stable -Will continue to be managed with hemodialysis for now.  8-hypoalbuminemia with severe protein calorie malnutrition -Patient motivated to continue improving oral intake -Diet has been advanced to soft -Continue vitamin supplementations.  9-anemia/thrombocytopenia: Due to chronic kidney disease and chemotherapy given for his multiple myeloma. -Hemoglobin has remained stable overall. -No signs of active bleeding. -No transfusion has been required.  10-hypomagnesemia -Repleted  -Will follow level intermittently.  11-MM  -Actively receiving treatment by oncology service as an outpatient  -Further follow-up and decisions to be taken after discharge.  -Condition is not in remission.  12-hypokalemia -Potassium less than 2 this morning  -20 mEq orally given and also potassium bath adjusted during hemodialysis treatment  -Will repeat BMET in am -His low potassium most likely secondary to still continuation of GI losses and poor by mouth intake.  13-physical deconditioning -Physical therapy recommending skilled nursing facility -Will continue following clinical response and final decisions/acceptance by patient and family once ready for discharge.  Code Status: Full code Family Communication: Wife by phone Disposition Plan: To be determined. Currently physical therapy recommended skilled nursing facility for rehabilitation at discharge; family unsure that they will like to pursuit placement. Will continue hemodialysis treatment as dictated by nephrology. Continue encouraging by mouth intake and maintaining a stable and electrolytes/volume status. Patient has now completed treatment of 10 days IV Flagyl and 14 days of oral vancomycin.   Consultants:  Nephrology  Urology  IR   Procedures:  See below for x-ray  reports  First Hemodialysis 5/16; second hemodialysis 5/17; third hemodialysis today (5/19); fourth hemodialysis treatment (5/21)   Antibiotics:  Vancomycin  05/12/16>>>5/21  Metronidazole  05/13/16>>>5/18  Zovirax   HPI/Subjective: Patient feeling much better a little. Denies chest pain, shortness of breath,  nausea and vomiting. Reports significant improvement intake quantity of episodes of loose stools. Appetite even is still poor according to patient is improving.  Objective: Vitals:   05/26/16 1153 05/26/16 1504  BP: 127/85 108/65  Pulse: 93 (!) 102  Resp: 12 16  Temp: 97.9 F (36.6 C) 97.6 F (36.4 C)    Intake/Output Summary (Last 24 hours) at 05/26/16 1757 Last data filed at 05/26/16 1153  Gross per 24 hour  Intake              240 ml  Output             2482 ml  Net            -2242 ml   Filed Weights   05/24/16 1300 05/26/16 0739 05/26/16 1153  Weight: 85.6 kg (188 lb 11.4 oz) 83 kg (182 lb 15.7 oz) 80.4 kg (177 lb 4 oz)    Exam:   General: Afebrile, no chest pain, no shortness of breath. Patient Douglas Rose. Tolerated hemodialysis treatment well and reports improvement of his oral intake.   Cardiovascular: Sinus rhythm, no rubs, no gallops, no murmur.   Respiratory: Improved movement bilaterally, no wheezing, no crackles, no using accessory muscles.   Abdomen: Soft, nontender, nondistended, positive bowel sounds.  Musculoskeletal: 1+ edema bilaterally, no cyanosis, no clubbing.  Data Reviewed: Basic Metabolic Panel:  Recent Labs Lab 05/21/16 0800 05/22/16 0422 05/23/16 0721 05/24/16 1009 05/26/16 0515  NA 136 136 137 135 135  K 3.3* 3.4* 3.1* 2.4* <2.0*  CL 104 101 104 104 104  CO2 _0 GLUCOSE 184* 217* 133* 176* 114*  BUN 103* 82* 58* 65* 50*  CREATININE 4.98* 4.10* 3.41* 3.58* 2.94*  CALCIUM 7.3* 7.2* 7.2* 7.2* 7.1*  MG  --   --   --   --  2.2  PHOS 7.5* 7.0* 5.1* 5.2* 4.3   Liver Function Tests:  Recent Labs Lab  05/21/16 0800 05/22/16 0422 05/23/16 0721 05/24/16 1009 05/26/16 0515  ALBUMIN 1.7* 1.7* 1.7* 1.7* 1.7*   CBC:  Recent Labs Lab 05/20/16 0624 05/21/16 0800 05/22/16 0422 05/23/16 0721 05/24/16 1008 05/26/16 0757  WBC 11.6* 10.5 12.5* 12.9* 10.4 7.8  NEUTROABS 9.8*  --   --   --   --   --   HGB 11.5* 10.9* 11.2* 10.4* 9.9* 9.8*  HCT 32.6* 30.9* 31.6* 29.9* 29.0* 28.3*  MCV 79.3 79.0 80.6 81.0 81.7 83.0  PLT 345 350 297 260 235 179   CBG:  Recent Labs Lab 05/25/16 1230 05/25/16 1734 05/25/16 2115 05/26/16 1300 05/26/16 1727  GLUCAP 197* 210* 217* 89 110*    No results found for this or any previous visit (from the past 240 hour(s)).   Studies: No results found.  Scheduled Meds: . acyclovir  200 mg Oral Daily  . atorvastatin  20 mg Oral Daily  . famotidine  20 mg Oral Daily  . feeding supplement (NEPRO CARB STEADY)  237 mL Oral BID BM  . feeding supplement (PRO-STAT SUGAR FREE 64)  30 mL Oral BID  . heparin subcutaneous  5,000 Units Subcutaneous Q8H  . insulin aspart  0-9 Units Subcutaneous TID WC  . insulin detemir  20 Units Subcutaneous QHS  . metoprolol succinate  75 mg Oral Daily  . saccharomyces boulardii  250 mg Oral BID   Continuous Infusions:   Principal Problem:   SIRS (systemic inflammatory response syndrome) (HCC) Active Problems:   Coronary artery disease  Hyperlipidemia   Hypertension   Multiple myeloma (HCC)   Colitis   Type 2 diabetes mellitus with vascular disease ([place])   Sinus tachycardia   AKI (acute kidney injury) ([place])   Enteritis due to Clostridium difficile    Time spent: 25 minutes    Barton Dubois  [facility] Pager [phone]. If 7PM-7AM, please contact night-coverage at [email or web address], password [facility] 05/26/2016, 5:57 PM  LOS: 14 days

## 2016-05-26 NOTE — Progress Notes (Signed)
PT Cancellation Note  Patient Details Name: Douglas Rose. MRN: [id number] DOB: Feb 23, 1945   Cancelled Treatment:    Reason Eval/Treat Not Completed: Other (comment) (Nursing declined PT due to recent return from HD).  Will check again tomorrow.   Ramond Dial 05/26/2016, 1:27 PM   Mee Hives, PT MS Acute Rehab Dept. Number: [place] and [place]

## 2016-05-27 DIAGNOSIS — Z515 Encounter for palliative care: Secondary | ICD-10-CM

## 2016-05-27 DIAGNOSIS — N189 Chronic kidney disease, unspecified: Secondary | ICD-10-CM

## 2016-05-27 DIAGNOSIS — R531 Weakness: Secondary | ICD-10-CM

## 2016-05-27 LAB — GLUCOSE, CAPILLARY
GLUCOSE-CAPILLARY: 158 mg/dL — AB (ref 65–99)
Glucose-Capillary: 134 mg/dL — ABNORMAL HIGH (ref 65–99)
Glucose-Capillary: 155 mg/dL — ABNORMAL HIGH (ref 65–99)

## 2016-05-27 LAB — BASIC METABOLIC PANEL
Anion gap: 7 (ref 5–15)
BUN: 30 mg/dL — AB (ref 6–20)
CALCIUM: 7.1 mg/dL — AB (ref 8.9–10.3)
CHLORIDE: 104 mmol/L (ref 101–111)
CO2: 25 mmol/L (ref 22–32)
CREATININE: 2.44 mg/dL — AB (ref 0.61–1.24)
GFR calc Af Amer: 29 mL/min — ABNORMAL LOW (ref >60)
GFR calc non Af Amer: 25 mL/min — ABNORMAL LOW (ref >60)
Glucose, Bld: 140 mg/dL — ABNORMAL HIGH (ref 65–99)
Potassium: 2.1 mmol/L — CL (ref 3.5–5.1)
SODIUM: 136 mmol/L (ref 135–145)

## 2016-05-27 LAB — MAGNESIUM: Magnesium: 1.9 mg/dL (ref 1.7–2.4)

## 2016-05-27 MED ORDER — POTASSIUM CHLORIDE CRYS ER 20 MEQ PO TBCR
40.0000 meq | EXTENDED_RELEASE_TABLET | ORAL | Status: AC
  Administered 2016-05-27 (×2): 40 meq via ORAL
  Filled 2016-05-27 (×2): qty 2

## 2016-05-27 NOTE — Progress Notes (Signed)
[facility] PROGRESS NOTE  Douglas Rose. VHQ:[id number] DOB: 06-Jan-1946 DOA: 05/11/2016 PCP: [name], MD  Interim summary and history of present illness 71 year old male with history of multiple myeloma on chemotherapy and recent mandibular abscess which was treated with clindamycin; presented to the emergency department secondary to crampy abdominal pain and multiple episode of diarrhea. Patient was found to have sepsis due to C. difficile colitis. His course has been now complicated with acute on chronic renal failure and oliguria requiring hemodialysis.  Assessment/Plan: 1-sepsis secondary to C. 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 - patient fluid status also significantly improved - will follow nephrology recommendations - patient has completed four dialysis treatment now; last one 5/21 - patient  Underlying chronic renal failure secondary to multiple myeloma and diabetes;  His acute failure secondary to hypertension from acute sepsis and also the use of contrast. - Will minimize/avoid nephrotoxic agents - continue adequate hydration and avoid hypotension   5-paraphimosis -Stable and is steadily improving.  -Patient has been seen by  urology and no further recommendation at this time (Foley has been discontinue and he was moderately reduced) -Patient experiencing increasing his urine output and endorses passing urine is easier  -Anticipate that further control of his Volume status will also improve swelling and phimosis of his penis.   6-hypertension -Stable and well controlled. -Will continue metoprolol at current dose.  7-metabolic acidosis: Secondary to renal failure -Currently managed with hemodialysis -Will monitor  8-hypoalbuminemia with severe protein calorie malnutrition -Patient reports an improvement in his appetite -Will continue motivating and encouraged him for good nutrition and hydration by mouth -Continue feeding supplements.  9-anemia/thrombocytopenia: Due to chronic kidney disease and chemotherapy given for his multiple myeloma. -Hemoglobin has remained stable -No transfusion has been require -No active signs of acute bleeding -Will continue monitoring trend.  10-hypomagnesemia -Currently stable and within normal limits -Will monitor treatment  11-MM  -Condition is not in remission -Patient to continue outpatient follow-up with his oncologist after discharge; he was actively receiving chemotherapy prior to admission.  12-hypokalemia -Potassium 2.1 today -Secondary to GI losses and decreased by mouth intake -We will provide electrolyte repletion and will follow trend -Magnesium within normal limits -Majority of his electrolytes normally adjusted during dialysis treatment.  13-physical deconditioning -PT recommending SNF  -Patient feeling weak and deconditioned  -Family considering home health services, family care and if needed private hiring duties  14-GERD -will continue Pepcid   Code Status: Full code Family Communication: Wife at bedside Disposition Plan: To be determined. Physical therapy recommending SNF, but family considering taking patient home with home health services,  family care and if needed private duties hiring. Last hemodialysis treatment 5/21; will follow renal service recommendations. Patient have completed treatment for his CAD and tinnitus with 10 days of Flagyl and 14 days of oral vancomycin.   Consultants:  Nephrology  Urology  IR   Procedures:  See below for x-ray reports  First Hemodialysis 5/16; second hemodialysis 5/17; third hemodialysis today (5/19); fourth hemodialysis treatment (5/21).   Antibiotics:  Vancomycin  05/12/16>>>5/21  Metronidazole  05/13/16>>>5/18  Zovirax   HPI/Subjective: Patient feeling a lot better and denying any chest pain or shortness of breath. Reports that his eating a little bit more and is also noticing increased in his urine output. Patient denies nausea, vomiting, abdominal pain or any other complaints. He is currently afebrile.  Objective: Vitals:   05/27/16 0622 05/27/16 0924  BP: 107/65 107/65  Pulse: 87 89  Resp: 18   Temp: 97.5 F (36.4 C)    No intake or output data in the 24 hours ending 05/27/16 1230 Filed Weights   05/24/16 1300 05/26/16 0739 05/26/16 1153  Weight: 85.6 kg (188 lb 11.4 oz) 83 kg (182 lb 15.7 oz) 80.4 kg (177 lb 4 oz)    Exam:   General: Afebrile, no chest pain, no shortness of breath. Patient in better spirit and reporting improvement in his loose stools. No nausea or vomiting reported. Feeling weak and tired, but much better overall.   Cardiovascular: RRR, no rubs, no gallops, no murmur. S  Respiratory: No wheezing, no crackles, no using accessory muscles; patient with good air movement bilaterally and good oxygen saturation on room air.   Abdomen: Soft, nontender, nondistended, positive bowel sounds.   Musculoskeletal: 1+ edema bilaterally extending up to his thighs, no cyanosis, no clubbing.   Data Reviewed: Basic Metabolic Panel:  Recent Labs Lab 05/21/16 0800 05/22/16 0422 05/23/16 0721 05/24/16 1009 05/26/16 0515 05/27/16 0644  NA 136 136 137  135 135 136  K 3.3* 3.4* 3.1* 2.4* <2.0* 2.1*  CL 104 101 104 104 104 104  CO2 22 23 23 23 22 25   GLUCOSE 184* 217* 133* 176* 114* 140*  BUN 103* 82* 58* 65* 50* 30*  CREATININE 4.98* 4.10* 3.41* 3.58* 2.94* 2.44*  CALCIUM 7.3* 7.2* 7.2* 7.2* 7.1* 7.1*  MG  --   --   --   --  2.2 1.9  PHOS 7.5* 7.0* 5.1* 5.2* 4.3  --    Liver Function Tests:  Recent Labs Lab 05/21/16 0800 05/22/16 0422 05/23/16 0721 05/24/16 1009 05/26/16 0515  ALBUMIN 1.7* 1.7* 1.7* 1.7* 1.7*   CBC:  Recent Labs Lab 05/21/16 0800 05/22/16 0422 05/23/16 0721 05/24/16 1008 05/26/16 0757  WBC 10.5 12.5* 12.9* 10.4 7.8  HGB 10.9* 11.2* 10.4* 9.9* 9.8*  HCT 30.9* 31.6* 29.9* 29.0* 28.3*  MCV 79.0 80.6 81.0 81.7 83.0  PLT 350 297 260 235 179   CBG:  Recent Labs Lab 05/26/16 1300 05/26/16 1727 05/26/16 2202 05/27/16 0909 05/27/16 1141  GLUCAP 89 110* 200* 134* 155*    No results found for this or any previous visit (from the past 240 hour(s)).   Studies: No results found.  Scheduled Meds: . acyclovir  200 mg Oral Daily  . atorvastatin  20 mg Oral Daily  . famotidine  20 mg Oral Daily  . feeding supplement (NEPRO CARB STEADY)  237 mL Oral BID BM  . feeding supplement (PRO-STAT SUGAR FREE 64)  30 mL Oral BID  . heparin subcutaneous  5,000 Units Subcutaneous Q8H  . insulin aspart  0-9 Units Subcutaneous TID WC  . insulin detemir  20 Units Subcutaneous QHS  . metoprolol succinate  75 mg Oral Daily  . saccharomyces boulardii  250 mg Oral BID   Continuous Infusions:  Sepsis Coronary artery disease Hyperlipidemia Hypertension Multiple myeloma (H[facility]) Colitis Type 2 diabetes mellitus with vascular disease ([place]) Sinus tachycardia AKI (acute kidney injury) ([place]) Enteritis due to Clostridium difficile    Time spent: 25 minutes    Barton Dubois  [facility] Pager [phone]. If 7PM-7AM, please contact night-coverage at [email or web address], password [facility] 05/27/2016, 12:30 PM  LOS:  15 days

## 2016-05-27 NOTE — Progress Notes (Signed)
Patient ID: Douglas Rose., male   DOB: Mar 21, 1945, 71 y.o.   MRN: [id number]  This NP visited patient at the bedside with his wife as a follow up to  [place] and access for palliative needs.  Focus of care for patient, and supported by his wife,  is treat the treatable and remain  hopeful for improvement.  We discussed his multiply co-morbid ites, specifically his current need for dialysis likely 2/2 to his multiple myeloma, hypoalbuminemia, overall weakness and failure to thrive.     - discussed importance of po intake    - increased mobility, wife tells me when patient is ready for discharge they will request home with home health and home PT     - discussed repositioning self in bed, cough and deep breathing   Discussed with patient the importance of continued conversation with family and the  medical providers regarding overall plan of care and treatment options,  ensuring decisions are within the context of the patients values and GOCs.  Questions and concerns addressed  I explained to Mr Strength I will be out of the hospital for the next week and that another PMT provider will follow up for needs.  Time in  1200         Time out 1235   Total time spent on the unit was 35 min  Greater than 50% of the time was spent in counseling and coordination of care  Wadie Lessen NP  Palliative Medicine Team Team Phone # [phone] Pager [phone]

## 2016-05-27 NOTE — Progress Notes (Signed)
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 Patient was found to have sepsis due to C. difficile colitis. His course has been now complicated with acute on chronic renal failure and oliguria requiring hemodialysis. Baseline creatinine 1.8    Objective:  Vital signs in last 24 hours:  Temp:  [97.5 F (36.4 C)-98.8 F (37.1 C)] 97.5 F (36.4 C) (05/22 0622) Pulse Rate:  [87-102] 89 (05/22 0924) Resp:  [16-18] 18 (05/22 0622) BP: (96-108)/(52-65) 107/65 (05/22 0924) SpO2:  [96 %-100 %] 100 % (05/22 0622)  Weight change:  Filed Weights   05/24/16 1300 05/26/16 0739 05/26/16 1153  Weight: 188 lb 11.4 oz (85.6 kg) 182 lb 15.7 oz (83 kg) 177 lb 4 oz (80.4 kg)    Intake/Output: I/O last 3 completed shifts: In: 240 [P.O.:240] Out: 2482 [Other:2482]   Intake/Output this shift:  No intake/output data recorded.  CVS- RRR RS- CTA ABD- BS present soft non-distended EXT- no edema   Basic Metabolic Panel:  Recent Labs Lab 05/21/16 0800 05/22/16 0422 05/23/16 0721 05/24/16 1009 05/26/16 0515 05/27/16 0644  NA 136 136 137 135 135 136  K 3.3* 3.4* 3.1* 2.4* <2.0* 2.1*  CL 104 101 104 104 104 104  CO2 22 23 23 23 22 25   GLUCOSE 184* 217* 133* 176* 114* 140*  BUN 103* 82* 58* 65* 50* 30*  CREATININE 4.98* 4.10* 3.41* 3.58* 2.94* 2.44*  CALCIUM 7.3* 7.2* 7.2* 7.2* 7.1* 7.1*  MG  --   --   --   --  2.2 1.9  PHOS 7.5* 7.0* 5.1* 5.2* 4.3  --     Liver Function Tests:  Recent Labs Lab 05/21/16 0800 05/22/16 0422 05/23/16 0721 05/24/16 1009 05/26/16 0515  ALBUMIN 1.7* 1.7* 1.7* 1.7* 1.7*   No results for input(s): LIPASE, AMYLASE in the last 168 hours. No results for input(s): AMMONIA in the last 168  hours.  CBC:  Recent Labs Lab 05/21/16 0800 05/22/16 0422 05/23/16 0721 05/24/16 1008 05/26/16 0757  WBC 10.5 12.5* 12.9* 10.4 7.8  HGB 10.9* 11.2* 10.4* 9.9* 9.8*  HCT 30.9* 31.6* 29.9* 29.0* 28.3*  MCV 79.0 80.6 81.0 81.7 83.0  PLT 350 297 260 235 179    Cardiac Enzymes: No results for input(s): CKTOTAL, CKMB, CKMBINDEX, TROPONINI in the last 168 hours.  BNP: Invalid input(s): POCBNP  CBG:  Recent Labs Lab 05/26/16 1300 05/26/16 1727 05/26/16 2202 05/27/16 0909 05/27/16 1141  GLUCAP 89 110* 200* 134* 155*    Microbiology: Results for orders placed or performed during the hospital encounter of 05/11/16  C difficile quick scan w PCR reflex     Status: Abnormal   Collection Time: 05/12/16  4:00 AM  Result Value Ref Range Status   C Diff antigen POSITIVE (A) NEGATIVE Final   C Diff toxin POSITIVE (A) NEGATIVE Final   C Diff interpretation Toxin producing C. difficile detected.  Final    Comment: RESULT CALLED TO, READ BACK BY AND VERIFIED WITH: KAYLA PRICE AT 0726 ON May 12 2016 BY SHEILA WOOD     Coagulation Studies: No results for input(s): LABPROT, INR in the last 72 hours.  Urinalysis: No results for input(s): COLORURINE, LABSPEC, PHURINE, GLUCOSEU, HGBUR, BILIRUBINUR, KETONESUR, PROTEINUR, UROBILINOGEN, NITRITE, LEUKOCYTESUR in the last 72 hours.  Invalid input(s):  APPERANCEUR    Imaging: No results found.   Medications:    . acyclovir  200 mg Oral Daily  . atorvastatin  20 mg Oral Daily  . famotidine  20 mg Oral Daily  . feeding supplement (NEPRO CARB STEADY)  237 mL Oral BID BM  . feeding supplement (PRO-STAT SUGAR FREE 64)  30 mL Oral BID  . heparin subcutaneous  5,000 Units Subcutaneous Q8H  . insulin aspart  0-9 Units Subcutaneous TID WC  . insulin detemir  20 Units Subcutaneous QHS  . metoprolol succinate  75 mg Oral Daily  . saccharomyces boulardii  250 mg Oral BID   acetaminophen **OR** acetaminophen, gi cocktail, heparin,  HYDROmorphone (DILAUDID) injection, lidocaine (PF), metoprolol tartrate, nitroGLYCERIN, ondansetron **OR** ondansetron (ZOFRAN) IV, sodium chloride flush  Assessment/ Plan:  1. Renal- creatinine 5/5 was 1.67 but has worsened since he has been in house.  I was hoping he was showing some signs of improvement but unfortunately it has not continued-. IR placed vascath 5/15, then  First HD 5/16, second 5/17, third 5/19 and 5/21 to correct the uremia and also the volume overload.  He has clinically improved. Given baseline crt good would hope only temporary and not permanent.  I also have decreased valcyte to more appropriate dose based on renal function.  2. HTN/vol- overloaded- BP marginal  - on lopressor for tachycardia have stopped lasix as is starting HD- third spacing due to low albumin- weight dec since starting HD  3. Anemia- due to CKD and multiple myeloma- hgb 11.2>10.4>9.9- supportive care  4. Metabolic acidosis- on oral bicarb- will correct further with HD- have stopped pills  5.C Diff- still symptomatic- on oral vanc and flagyl  6. Hyperphos- treat only with HD for now 7. Nausea- due to c diff, meds or uremia- if due to uremia should be getting better- is really limiting his improvement 8. Hypokalemia- dialyzed on a 4 K bath the whole time yesterday- the 2.4 was pre HD   LOS: 15 [place] W @TODAY @2 :51 PM

## 2016-05-27 NOTE — Progress Notes (Signed)
Physical Therapy Treatment Patient Details Name: Douglas Rose. MRN: [id number] DOB: [birth year] Today's Date: 05/27/2016    History of Present Illness Douglas Rose. is a 71 y.o. male with history of multiple myeloma on chemotherapy, recent history of C. Diff colitis, who was placed on clindamycin for the last 3 weeks for mandibular abscess who presented to the ER with complaints of diffuse crampy abdominal pain with multiple episodes of diarrhea.  He was found to have sepsis due to C. Diff colitis.  His course has been complicated by ongoing sinus tachycardia and AKI.     PT Comments    Patient demonstrated improved mobility this session and tolerated OOB to [facility] with min A and +2 for safety. Pt is able to mobilize with more independence with extra encouragement although he is deconditioned. Pt reported sitting up in chair this am and just back to bed upon arrival so pt returned to bed end of session. Wife present. Continue to progress as tolerated with anticipated d/c to SNF for further skilled PT services.     Follow Up Recommendations  SNF     Equipment Recommendations  Other (comment) (TBA)    Recommendations for Other Services       Precautions / Restrictions Precautions Precautions: Fall    Mobility  Bed Mobility Overal bed mobility: Needs Assistance Bed Mobility: Supine to Sit;Sit to Supine     Supine to sit: Min assist Sit to supine: Min assist   General bed mobility comments: assist to elevate trunk into sitting and to bring bilat LE toward EOB as well as bring bilat LE into bed; cues for sequencing and technique  Transfers Overall transfer level: Needs assistance Equipment used: Rolling walker (2 wheeled) Transfers: Sit to/from [facility] Sit to Stand: Min assist Stand pivot transfers: Min assist;+2 safety/equipment       General transfer comment: assist to power up into standing from EOB and BSC; pt able to take pivotal steps bed  to [facility] and BSC back to bed with min A to steady and manage RW; multimodal cues for posture  Ambulation/Gait                 Stairs            Wheelchair Mobility    Modified Rankin (Stroke Patients Only)       Balance Overall balance assessment: Needs assistance Sitting-balance support: Bilateral upper extremity supported;Feet supported Sitting balance-Leahy Scale: Fair     Standing balance support: Bilateral upper extremity supported Standing balance-Leahy Scale: Poor                              Cognition Arousal/Alertness: Awake/alert Behavior During Therapy: Flat affect Overall Cognitive Status: Within Functional Limits for tasks assessed                                        Exercises      General Comments General comments (skin integrity, edema, etc.): pt's bottom sore after pericare and cream requested from RN      Pertinent Vitals/Pain Pain Assessment: Faces Faces Pain Scale: Hurts even more Pain Location: bottom from cdiff Pain Descriptors / Indicators: Burning;Sore Pain Intervention(s): Repositioned;Monitored during session    Home Living  Prior Function            PT Goals (current goals can now be found in the care plan section) Acute Rehab PT Goals PT Goal Formulation: With patient Time For Goal Achievement: 05/31/16 Potential to Achieve Goals: Fair Progress towards PT goals: Progressing toward goals    Frequency    Min 3X/week      PT Plan Current plan remains appropriate    Co-evaluation              AM-PAC PT "6 Clicks" Daily Activity  Outcome Measure  Difficulty turning over in bed (including adjusting bedclothes, sheets and blankets)?: Total Difficulty moving from lying on back to sitting on the side of the bed? : Total Difficulty sitting down on and standing up from a chair with arms (e.g., wheelchair, bedside commode, etc,.)?: Total Help needed  moving to and from a bed to chair (including a wheelchair)?: A Little Help needed walking in hospital room?: A Lot Help needed climbing 3-5 steps with a railing? : A Lot 6 Click Score: 10    End of Session Equipment Utilized During Treatment: Gait belt Activity Tolerance: Patient tolerated treatment well Patient left: in bed;with call bell/phone within reach;with family/visitor present Nurse Communication: Mobility status PT Visit Diagnosis: Unsteadiness on feet (R26.81);Muscle weakness (generalized) (M62.81);Other abnormalities of gait and mobility (R26.89)     Time: 1901-2224 PT Time Calculation (min) (ACUTE ONLY): 31 min  Charges:  $Therapeutic Activity: 23-37 mins                    G Codes:       Earney Navy, PTA Pager: [phone]     Darliss Cheney 05/27/2016, 4:20 PM

## 2016-05-27 NOTE — Progress Notes (Signed)
CRITICAL VALUE ALERT  Critical value received: K+ 2.1  Date of notification: 05/27/16  Time of notification: 0801  Critical value read back: yes  Nurse who received alert: Lonn Georgia  MD notified (1st page): Dr. Dyann Kief  Time of first page:   MD notified (2nd page):  Time of second page:  Responding MD:  Dr. Dyann Kief  Time MD responded:  1030

## 2016-05-28 DIAGNOSIS — A4189 Other specified sepsis: Secondary | ICD-10-CM

## 2016-05-28 LAB — RENAL FUNCTION PANEL
Albumin: 1.6 g/dL — ABNORMAL LOW (ref 3.5–5.0)
Anion gap: 6 (ref 5–15)
BUN: 35 mg/dL — ABNORMAL HIGH (ref 6–20)
CO2: 23 mmol/L (ref 22–32)
Calcium: 7.2 mg/dL — ABNORMAL LOW (ref 8.9–10.3)
Chloride: 108 mmol/L (ref 101–111)
Creatinine, Ser: 2.56 mg/dL — ABNORMAL HIGH (ref 0.61–1.24)
GFR calc Af Amer: 27 mL/min — ABNORMAL LOW (ref >60)
GFR calc non Af Amer: 23 mL/min — ABNORMAL LOW (ref >60)
Glucose, Bld: 121 mg/dL — ABNORMAL HIGH (ref 65–99)
Phosphorus: 3.8 mg/dL (ref 2.5–4.6)
Potassium: <2 mmol/L — CL (ref 3.5–5.1)
Sodium: 137 mmol/L (ref 135–145)

## 2016-05-28 LAB — BASIC METABOLIC PANEL
ANION GAP: 9 (ref 5–15)
BUN: 40 mg/dL — ABNORMAL HIGH (ref 6–20)
CHLORIDE: 108 mmol/L (ref 101–111)
CO2: 16 mmol/L — ABNORMAL LOW (ref 22–32)
Calcium: 7.2 mg/dL — ABNORMAL LOW (ref 8.9–10.3)
Creatinine, Ser: 2.4 mg/dL — ABNORMAL HIGH (ref 0.61–1.24)
GFR calc non Af Amer: 25 mL/min — ABNORMAL LOW (ref >60)
GFR, EST AFRICAN AMERICAN: 29 mL/min — AB (ref >60)
GLUCOSE: 261 mg/dL — AB (ref 65–99)
Potassium: 3.7 mmol/L (ref 3.5–5.1)
Sodium: 133 mmol/L — ABNORMAL LOW (ref 135–145)

## 2016-05-28 LAB — CBC
HCT: 27.2 % — ABNORMAL LOW (ref 39.0–52.0)
HEMOGLOBIN: 9.2 g/dL — AB (ref 13.0–17.0)
MCH: 28.5 pg (ref 26.0–34.0)
MCHC: 33.8 g/dL (ref 30.0–36.0)
MCV: 84.2 fL (ref 78.0–100.0)
Platelets: 140 10*3/uL — ABNORMAL LOW (ref 150–400)
RBC: 3.23 MIL/uL — AB (ref 4.22–5.81)
RDW: 21.2 % — ABNORMAL HIGH (ref 11.5–15.5)
WBC: 4.1 10*3/uL (ref 4.0–10.5)

## 2016-05-28 LAB — GLUCOSE, CAPILLARY
GLUCOSE-CAPILLARY: 124 mg/dL — AB (ref 65–99)
GLUCOSE-CAPILLARY: 259 mg/dL — AB (ref 65–99)
Glucose-Capillary: 189 mg/dL — ABNORMAL HIGH (ref 65–99)
Glucose-Capillary: 130 mg/dL — ABNORMAL HIGH (ref 65–99)
Glucose-Capillary: 215 mg/dL — ABNORMAL HIGH (ref 65–99)

## 2016-05-28 LAB — MAGNESIUM: Magnesium: 2 mg/dL (ref 1.7–2.4)

## 2016-05-28 MED ORDER — POTASSIUM CHLORIDE 10 MEQ/100ML IV SOLN
10.0000 meq | INTRAVENOUS | Status: AC
  Administered 2016-05-28 (×3): 10 meq via INTRAVENOUS
  Filled 2016-05-28 (×3): qty 100

## 2016-05-28 MED ORDER — POTASSIUM CHLORIDE CRYS ER 20 MEQ PO TBCR
40.0000 meq | EXTENDED_RELEASE_TABLET | ORAL | Status: AC
  Administered 2016-05-28 (×2): 40 meq via ORAL
  Filled 2016-05-28 (×2): qty 2

## 2016-05-28 NOTE — Progress Notes (Signed)
Nutrition Follow-up  DOCUMENTATION CODES:   Not applicable  INTERVENTION:   -Continue Nepro Shake po BID, each supplement provides 425 kcal and 19 grams protein -Continue 30 ml Prostat BID, each supplement provides 100 kcals and 15 grams protein  NUTRITION DIAGNOSIS:   Inadequate oral intake related to poor appetite, vomiting, nausea as evidenced by meal completion < 25%.  Ongoing  GOAL:   Patient will meet greater than or equal to 90% of their needs  Progressing  MONITOR:   PO intake, Supplement acceptance, Labs, Weight trends, Skin, I & O's  REASON FOR ASSESSMENT:   Malnutrition Screening Tool    ASSESSMENT:   Douglas Som. is a 71 y.o. male with history of multiple myeloma recently diagnosed and chemotherapy last chemotherapy 3 days ago and recently placed on clindamycin for the last 3 weeks for mandibular abscess presents to the ER with complaints of diffuse crampy abdominal pain with multiple episodes of diarrhea. Patient had one episode of nausea and vomiting. Denies any fever or chills. Over the last 4 weeks patient has been on chemotherapy for multiple myeloma. Patient states over the last 24 hours patient also had some exertional chest pressure. No chest pain on resting.   Pt in with MD at time of visit.   Per chart review, intake has improved over the past 5 days, however, still variable. Noted 25-100% meal completion per doc flowsheet records. Per MAR, pt is accepting Prostat and Nepro supplements. Pt consuming 1-2 Nepro supplements per day on average.   Nephrology following; pt received HD on 5/16, 5/17, 5/19, and 5/21. Hopeful for temporary HD; per MD notes no HD this AM.   Palliative care team following for [place]; plan is to treat the treatable with hopeful improvement. Plan to d/c with home health services, or possible SNF, one medically stable.  Labs reviewed: K: 2.0, CBGS: 124-189.   Diet Order:  DIET SOFT Room service appropriate? Yes; Fluid  consistency: Thin  Skin:  Reviewed, no issues  Last BM:  05/28/16  Height:   Ht Readings from Last 1 Encounters:  05/12/16 _0  (1.803 m)    Weight:   Wt Readings from Last 1 Encounters:  05/26/16 177 lb 4 oz (80.4 kg)    Ideal Body Weight:  78.2 kg  BMI:  Body mass index is 24.72 kg/m.  Estimated Nutritional Needs:   Kcal:  1800-2000  Protein:  90-105 grams  Fluid:  1.8-2.0 L  EDUCATION NEEDS:   Education needs addressed  [name] A. Jimmye Norman, RD, LDN, CDE Pager: [phone] After hours Pager: [phone]

## 2016-05-28 NOTE — Progress Notes (Signed)
Doctor Posey Pronto notified that pt has no labs ordered for today and potasium on 05/27/16 was 2.1.

## 2016-05-28 NOTE — Progress Notes (Signed)
[facility] Progress Note  Patient: Douglas Rose. GBT:[id number]   PCP: [name], MD DOB: 1945-09-21   DOA: 05/11/2016   DOS: 05/28/2016   Date of Service: the patient was seen and examined on 05/28/2016  Subjective: Feeling fatigue and tired although better than yesterday. Abdominal pain. Continues to have diarrhea but it is Formed. No nausea no vomiting. Tolerating oral diet as well as oral pills.  Brief hospital course: Pt. with PMH of multiple myeloma on chemotherapy, ending with an abscess on clindamycin, chronic kidney disease, type II DM; admitted on 05/11/2016, presented with complaint of diarrhea, was found to have C. difficile colitis with acute kidney injury. Currently further plan is in renal function recovery monitoring.  Assessment and Plan: 1-sepsis secondary to C. 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 - patient fluid status also significantly improved - will follow nephrology recommendations - patient has completed four dialysis treatment now; last one 5/21 - patient  Underlying chronic renal failure secondary to multiple myeloma and diabetes;  His acute failure secondary to hypertension from acute sepsis and also the use of  contrast. - Will minimize/avoid nephrotoxic agents - continue adequate hydration and avoid hypotension   5-paraphimosis -Stable and is steadily improving.  -Patient has been seen by urology and no further recommendation at this time (Foley has been discontinue and he was moderately reduced) -Anticipate that further control of his Volume status will also improve swelling and phimosis of his penis.   6-hypertension -Stable and well controlled. -Will continue metoprolol at current dose.  7-metabolic acidosis: Secondary to renal failure -Currently managed with hemodialysis -Will monitor  8-hypoalbuminemia with severe protein calorie malnutrition -Patient reports an improvement in his appetite -Will continue motivating and encouraged him for good nutrition and hydration by mouth -Continue feeding supplements.  9-anemia/thrombocytopenia: Due to chronic kidney disease and chemotherapy given for his multiple myeloma. -Hemoglobin has remained stable -No transfusion has been require -No active signs of acute bleeding -Will continue monitoring trend.  10-hypomagnesemia -Currently stable and within normal limits -Will monitor treatment  11-MM  -Condition is not in remission -Patient to continue outpatient follow-up with his oncologist after discharge; he was actively receiving chemotherapy prior to admission.  12-hypokalemia -Potassium low -Secondary to GI losses and decreased by mouth intake -We will provide electrolyte repletion and will follow trend -Magnesium within normal limits -Majority of his electrolytes normally adjusted during dialysis treatment.  13-physical deconditioning -PT recommending SNF  -Patient feeling weak and deconditioned  -Family considering home health services, family care and if needed private hiring duties  14-GERD -will continue Pepcid  Diet: soft diet DVT Prophylaxis: subcutaneous Heparin Advance goals of care discussion: full  code  Family Communication: family was present at bedside, at the time of interview. The pt provided permission to discuss medical plan with the family. Opportunity was given to ask question and  all questions were answered satisfactorily.   Disposition:  Discharge to SNF.  Consultants: Nephrology, urology, ER Procedures: HD with IR guided line placement  Antibiotics: Anti-infectives    Start     Dose/Rate Route Frequency Ordered Stop   05/20/16 1000  acyclovir (ZOVIRAX) 200 MG capsule 200 mg     200 mg Oral Daily 05/19/16 1137     05/12/16 1000  acyclovir (ZOVIRAX) 200 MG capsule 200 mg  Status:  Discontinued     200 mg Oral 2 times daily 05/12/16 0532 05/19/16 1137   05/12/16 0900  vancomycin (VANCOCIN) 50 mg/mL oral solution 500 mg     500 mg Oral Every 6 hours 05/12/16 0748 05/26/16 0859   05/12/16 0600  metroNIDAZOLE (FLAGYL) IVPB 500 mg  Status:  Discontinued     500 mg 100 mL/hr over 60 Minutes Intravenous Every 8 hours 05/12/16 0532 05/22/16 0955   05/12/16 0330  metroNIDAZOLE (FLAGYL) tablet 500 mg     500 mg Oral  Once 05/12/16 0327 05/12/16 0338   05/12/16 0330  ciprofloxacin (CIPRO) tablet 500 mg     500 mg Oral  Once 05/12/16 0327 05/12/16 0338       Objective: Physical Exam: Vitals:   05/27/16 2315 05/28/16 0527 05/28/16 0855 05/28/16 1424  BP: 140/71 (!) 101/54 111/61 113/66  Pulse: 88 87 81 87  Resp: 18 18  19   Temp: 97.9 F (36.6 C) 98 F (36.7 C)  98.6 F (37 C)  TempSrc:  Oral    SpO2: 97% 98%  99%  Weight:      Height:        Intake/Output Summary (Last 24 hours) at 05/28/16 1705 Last data filed at 05/28/16 1525  Gross per 24 hour  Intake              570 ml  Output                0 ml  Net              570 ml   Filed Weights   05/24/16 1300 05/26/16 0739 05/26/16 1153  Weight: 85.6 kg (188 lb 11.4 oz) 83 kg (182 lb 15.7 oz) 80.4 kg (177 lb 4 oz)   General: Alert, Awake and Oriented to Time, Place and Person. Appear in mild distress, affect  appropriate Eyes: PERRL, Conjunctiva normal ENT: Oral Mucosa clear moist. Neck: difficult to assess JVD, no Abnormal Mass Or lumps Cardiovascular: S1 and S2 Present, no Murmur, Respiratory: Bilateral Air entry equal and Decreased, no use of accessory muscle, Clear to Auscultation, no Crackles, no wheezes Abdomen: Bowel Sound present, Soft and mild tenderness Skin: no redness, no Rash, no induration Extremities: no Pedal edema, no calf tenderness Neurologic: Grossly no focal neuro deficit. Bilaterally Equal motor strength  Data Reviewed: CBC:  Recent Labs Lab 05/22/16 0422 05/23/16 0721 05/24/16 1008 05/26/16 0757 05/28/16 0810  WBC 12.5* 12.9* 10.4 7.8 4.1  HGB 11.2* 10.4* 9.9* 9.8* 9.2*  HCT 31.6* 29.9* 29.0* 28.3* 27.2*  MCV 80.6 81.0 81.7 83.0 84.2  PLT 297 260 235 179 742*   Basic Metabolic Panel:  Recent Labs Lab 05/22/16 0422 05/23/16 0721 05/24/16 1009 05/26/16 0515 05/27/16 0644 05/28/16 0810  NA 136 137 135 135 136 137  K 3.4* 3.1* 2.4* <2.0* 2.1* <2.0*  CL 101 104 104 104 104 108  CO2 23 23 23 22 25 23   GLUCOSE 217* 133* 176* 114* 140* 121*  BUN 82* 58* 65*  50* 30* 35*  CREATININE 4.10* 3.41* 3.58* 2.94* 2.44* 2.56*  CALCIUM 7.2* 7.2* 7.2* 7.1* 7.1* 7.2*  MG  --   --   --  2.2 1.9 2.0  PHOS 7.0* 5.1* 5.2* 4.3  --  3.8    Liver Function Tests:  Recent Labs Lab 05/22/16 0422 05/23/16 0721 05/24/16 1009 05/26/16 0515 05/28/16 0810  ALBUMIN 1.7* 1.7* 1.7* 1.7* 1.6*   No results for input(s): LIPASE, AMYLASE in the last 168 hours. No results for input(s): AMMONIA in the last 168 hours. Coagulation Profile: No results for input(s): INR, PROTIME in the last 168 hours. Cardiac Enzymes: No results for input(s): CKTOTAL, CKMB, CKMBINDEX, TROPONINI in the last 168 hours. BNP (last 3 results) No results for input(s): PROBNP in the last 8760 hours. CBG:  Recent Labs Lab 05/27/16 1141 05/27/16 1712 05/27/16 2259 05/28/16 0803 05/28/16 1145    GLUCAP 155* 158* 189* 124* 130*   Studies: No results found.  Scheduled Meds: . acyclovir  200 mg Oral Daily  . atorvastatin  20 mg Oral Daily  . famotidine  20 mg Oral Daily  . feeding supplement (NEPRO CARB STEADY)  237 mL Oral BID BM  . feeding supplement (PRO-STAT SUGAR FREE 64)  30 mL Oral BID  . heparin subcutaneous  5,000 Units Subcutaneous Q8H  . insulin aspart  0-9 Units Subcutaneous TID WC  . insulin detemir  20 Units Subcutaneous QHS  . metoprolol succinate  75 mg Oral Daily  . saccharomyces boulardii  250 mg Oral BID   Continuous Infusions: PRN Meds: acetaminophen **OR** acetaminophen, gi cocktail, heparin, HYDROmorphone (DILAUDID) injection, lidocaine (PF), nitroGLYCERIN, ondansetron **OR** ondansetron (ZOFRAN) IV, sodium chloride flush  Time spent: 30 minutes  Author: Berle Mull, MD [facility] Pager: [phone] 05/28/2016 5:05 PM  If 7PM-7AM, please contact night-coverage at [email or web address], password [facility]

## 2016-05-28 NOTE — Progress Notes (Signed)
Patient's wife requested another copy of SNF list. CSW provided list.  Cedric Fishman [facility] [phone]

## 2016-05-28 NOTE — Progress Notes (Signed)
CRITICAL VALUE ALERT  Critical Value:  K+ <2.0  Date & Time Notied: 05/28/16 1008  Provider Notified: Dr. Posey Pronto  Orders Received/Actions taken: PO K= replacement

## 2016-05-28 NOTE — Progress Notes (Signed)
[facility] ROUNDING NOTE   Subjective:   Interval History:Interval History: 71 year old male with history of multiple myeloma on chemotherapy and recent mandibular abscess which was treated with clindamycin; presented to the emergency department secondary to crampy abdominal pain and multiple episode of diarrhea. Patient was found to have sepsis due to C. difficile colitis. His course has been now complicated with acute on chronic renal failure and oliguria requiring hemodialysis. Baseline creatinine 1.8  Objective:  Vital signs in last 24 hours:  Temp:  [97.7 F (36.5 C)-98 F (36.7 C)] 98 F (36.7 C) (05/23 0527) Pulse Rate:  [84-89] 87 (05/23 0527) Resp:  [18] 18 (05/23 0527) BP: (101-140)/(54-71) 101/54 (05/23 0527) SpO2:  [97 %-100 %] 98 % (05/23 0527)  Weight change:  Filed Weights   05/24/16 1300 05/26/16 0739 05/26/16 1153  Weight: 188 lb 11.4 oz (85.6 kg) 182 lb 15.7 oz (83 kg) 177 lb 4 oz (80.4 kg)    Intake/Output: I/O last 3 completed shifts: In: 10 [I.V.:10] Out: -    Intake/Output this shift:  No intake/output data recorded.  CVS- RRR RS- CTA ABD- BS present soft non-distended EXT- no edema   Basic Metabolic Panel:  Recent Labs Lab 05/22/16 0422 05/23/16 0721 05/24/16 1009 05/26/16 0515 05/27/16 0644  NA 136 137 135 135 136  K 3.4* 3.1* 2.4* <2.0* 2.1*  CL 101 104 104 104 104  CO2 23 23 23 22 25   GLUCOSE 217* 133* 176* 114* 140*  BUN 82* 58* 65* 50* 30*  CREATININE 4.10* 3.41* 3.58* 2.94* 2.44*  CALCIUM 7.2* 7.2* 7.2* 7.1* 7.1*  MG  --   --   --  2.2 1.9  PHOS 7.0* 5.1* 5.2* 4.3  --     Liver Function Tests:  Recent Labs Lab 05/22/16 0422 05/23/16 0721 05/24/16 1009 05/26/16 0515  ALBUMIN 1.7* 1.7* 1.7* 1.7*   No results for input(s): LIPASE, AMYLASE in the last 168 hours. No results for input(s): AMMONIA in the last 168 hours.  CBC:  Recent Labs Lab 05/22/16 0422 05/23/16 0721 05/24/16 1008 05/26/16 0757   WBC 12.5* 12.9* 10.4 7.8  HGB 11.2* 10.4* 9.9* 9.8*  HCT 31.6* 29.9* 29.0* 28.3*  MCV 80.6 81.0 81.7 83.0  PLT 297 260 235 179    Cardiac Enzymes: No results for input(s): CKTOTAL, CKMB, CKMBINDEX, TROPONINI in the last 168 hours.  BNP: Invalid input(s): POCBNP  CBG:  Recent Labs Lab 05/27/16 0909 05/27/16 1141 05/27/16 1712 05/27/16 2259 05/28/16 0803  GLUCAP 134* 155* 158* 189* 48*    Microbiology: Results for orders placed or performed during the hospital encounter of 05/11/16  C difficile quick scan w PCR reflex     Status: Abnormal   Collection Time: 05/12/16  4:00 AM  Result Value Ref Range Status   C Diff antigen POSITIVE (A) NEGATIVE Final   C Diff toxin POSITIVE (A) NEGATIVE Final   C Diff interpretation Toxin producing C. difficile detected.  Final    Comment: RESULT CALLED TO, READ BACK BY AND VERIFIED WITH: KAYLA PRICE AT 0726 ON May 12 2016 BY SHEILA WOOD     Coagulation Studies: No results for input(s): LABPROT, INR in the last 72 hours.  Urinalysis: No results for input(s): COLORURINE, LABSPEC, PHURINE, GLUCOSEU, HGBUR, BILIRUBINUR, KETONESUR, PROTEINUR, UROBILINOGEN, NITRITE, LEUKOCYTESUR in the last 72 hours.  Invalid input(s): APPERANCEUR    Imaging: No results found.   Medications:    . acyclovir  200 mg Oral Daily  . atorvastatin  20 mg Oral Daily  . famotidine  20 mg Oral Daily  . feeding supplement (NEPRO CARB STEADY)  237 mL Oral BID BM  . feeding supplement (PRO-STAT SUGAR FREE 64)  30 mL Oral BID  . heparin subcutaneous  5,000 Units Subcutaneous Q8H  . insulin aspart  0-9 Units Subcutaneous TID WC  . insulin detemir  20 Units Subcutaneous QHS  . metoprolol succinate  75 mg Oral Daily  . saccharomyces boulardii  250 mg Oral BID   acetaminophen **OR** acetaminophen, gi cocktail, heparin, HYDROmorphone (DILAUDID) injection, lidocaine (PF), nitroGLYCERIN, ondansetron **OR** ondansetron (ZOFRAN) IV, sodium chloride  flush  Assessment/ Plan:  1. Renal- creatinine 5/5 was 1.67 but has worsened since he has been in house. I was hoping he was showing some signs of improvement but unfortunately it has not continued-. IR placed vascath 5/15, then First HD 5/16, second 5/17, third 5/19 and 5/21 to correct the uremia and also the volume overload. He has clinically improved. Given baseline crt good would hope only temporary and not permanent. I also have decreased valcyte to more appropriate dose based on renal function. No indications for dialysis this morning although AM labs are pending  2. HTN/vol-overloaded- BP marginal - on lopressor for tachycardia have stopped lasix as is starting HD- third spacing due to low albumin- weight dec since starting HD 3. Anemia- due to CKD and multiple myeloma- hgb 11.2>10.4>9.9- supportive care  4. Metabolic acidosis-on oral bicarb- will correct further with HD- have stopped pills  5.C Diff- still symptomatic- on oral vanc and flagyl  6. Hyperphos- treat only with HD for now 7. Nausea- due to c diff, meds or uremia- if due to uremia should be getting better- is really limiting his improvement 8. Hypokalemia- follow up labs today      LOS: 16 Shirleyann Montero W @TODAY @8 :13 AM

## 2016-05-29 LAB — RENAL FUNCTION PANEL
ANION GAP: 5 (ref 5–15)
Albumin: 1.6 g/dL — ABNORMAL LOW (ref 3.5–5.0)
BUN: 38 mg/dL — ABNORMAL HIGH (ref 6–20)
CALCIUM: 7.3 mg/dL — AB (ref 8.9–10.3)
CO2: 20 mmol/L — AB (ref 22–32)
Chloride: 110 mmol/L (ref 101–111)
Creatinine, Ser: 2.36 mg/dL — ABNORMAL HIGH (ref 0.61–1.24)
GFR calc non Af Amer: 26 mL/min — ABNORMAL LOW (ref >60)
GFR, EST AFRICAN AMERICAN: 30 mL/min — AB (ref >60)
Glucose, Bld: 207 mg/dL — ABNORMAL HIGH (ref 65–99)
PHOSPHORUS: 3.3 mg/dL (ref 2.5–4.6)
POTASSIUM: 2.3 mmol/L — AB (ref 3.5–5.1)
SODIUM: 135 mmol/L (ref 135–145)

## 2016-05-29 LAB — GLUCOSE, CAPILLARY
GLUCOSE-CAPILLARY: 158 mg/dL — AB (ref 65–99)
GLUCOSE-CAPILLARY: 178 mg/dL — AB (ref 65–99)
Glucose-Capillary: 157 mg/dL — ABNORMAL HIGH (ref 65–99)
Glucose-Capillary: 200 mg/dL — ABNORMAL HIGH (ref 65–99)

## 2016-05-29 LAB — MAGNESIUM: Magnesium: 1.8 mg/dL (ref 1.7–2.4)

## 2016-05-29 LAB — POTASSIUM: Potassium: 3.1 mmol/L — ABNORMAL LOW (ref 3.5–5.1)

## 2016-05-29 MED ORDER — POTASSIUM CHLORIDE CRYS ER 20 MEQ PO TBCR
40.0000 meq | EXTENDED_RELEASE_TABLET | Freq: Once | ORAL | Status: AC
  Administered 2016-05-29: 40 meq via ORAL
  Filled 2016-05-29: qty 2

## 2016-05-29 MED ORDER — POTASSIUM CHLORIDE CRYS ER 10 MEQ PO TBCR
40.0000 meq | EXTENDED_RELEASE_TABLET | Freq: Three times a day (TID) | ORAL | Status: DC
  Administered 2016-05-30 (×2): 40 meq via ORAL
  Filled 2016-05-29 (×2): qty 4

## 2016-05-29 MED ORDER — POTASSIUM CHLORIDE CRYS ER 10 MEQ PO TBCR
40.0000 meq | EXTENDED_RELEASE_TABLET | ORAL | Status: AC
  Administered 2016-05-29 (×2): 40 meq via ORAL
  Filled 2016-05-29: qty 2
  Filled 2016-05-29: qty 4

## 2016-05-29 MED ORDER — POTASSIUM CHLORIDE 10 MEQ/100ML IV SOLN
10.0000 meq | INTRAVENOUS | Status: AC
  Administered 2016-05-29 (×4): 10 meq via INTRAVENOUS
  Filled 2016-05-29 (×4): qty 100

## 2016-05-29 NOTE — Progress Notes (Signed)
[facility] Progress Note  Patient: Douglas Rose. ENI:[id number]   PCP: [name], MD DOB: [birth year]   DOA: 05/11/2016   DOS: 05/29/2016   Date of Service: the patient was seen and examined on 05/29/2016  Subjective: More awake and communicative. More active as well. Diarrhea is also getting more and more formed. No nausea no vomiting.  Brief hospital course: Pt. with PMH of multiple myeloma on chemotherapy, ending with an abscess on clindamycin, chronic kidney disease, type II DM; admitted on 05/11/2016, presented with complaint of diarrhea, was found to have C. difficile colitis with acute kidney injury. Currently further plan is in renal function recovery monitoring.  Assessment and Plan: 1-sepsis secondary to C. difficile colitis -Sepsis features essentially resolved -will continue supportive care and follow clinical response -no abd pain and no nausea or vomiting -stool is also more formed -patient completed 14 days of oral vancomycin and 10 days of IV flagyl -will continue florastor   2-sinus tachycardia: Appears to be secondary to rebound tachycardia -HR is well controlled -Denies CP, palpitations and SOB -will continue metoprolol, continue current dose  3-diabetes mellitus type 2 with hyperglycemia -CBG's well controlled -will continue SSI and levemir -will adjust  Hypoglycemic regimen as needed   4-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 - patient fluid status also significantly improved - patient has completed four dialysis treatment now; last one 5/21 - patient  Underlying chronic renal failure secondary to multiple myeloma and diabetes;  His acute failure secondary to hypertension from acute sepsis and also the use of contrast. - Will minimize/avoid nephrotoxic agents - continue adequate hydration and avoid hypotension    - Nephrology currently signed off, we will remove the dialysis catheter as well.  5-paraphimosis -Stable and is steadily improving.  -Patient has been seen by urology and no further recommendation at this time (Foley has been discontinue and he was moderately reduced) -Anticipate that further control of his Volume status will also improve swelling and phimosis of his penis.   6-hypertension -Stable and well controlled. -Will continue metoprolol at current dose.  7-metabolic acidosis: Secondary to renal failure -Currently managed with hemodialysis -Will monitor  8-hypoalbuminemia with severe protein calorie malnutrition -Patient reports an improvement in his appetite -Will continue motivating and encouraged him for good nutrition and hydration by mouth -Continue feeding supplements.  9-anemia/thrombocytopenia: Due to chronic kidney disease and chemotherapy given for his multiple myeloma. -Hemoglobin has remained stable -No transfusion has been require -No active signs of acute bleeding -Will continue monitoring trend.  10-hypomagnesemia -Currently stable and within normal limits -Will monitor treatment  11-MM  -Condition is not in remission -Patient to continue outpatient follow-up with his oncologist after discharge; he was actively receiving chemotherapy prior to admission.  12-hypokalemia -Potassium low -Secondary to GI losses and decreased by mouth intake -We will provide electrolyte repletion and will follow trend -Magnesium within normal limits -Majority of his electrolytes normally adjusted during dialysis treatment.  13-physical deconditioning -PT recommending SNF  -Patient feeling weak and deconditioned  -Family considering home health services, family care and if needed private hiring duties  14-GERD -will continue Pepcid  Diet: soft diet DVT Prophylaxis: subcutaneous Heparin Advance goals of care discussion: full code  Family Communication:  family was present at bedside, at the time of interview. The pt provided permission to discuss medical plan with the family. Opportunity was given to ask question and  all questions were answered satisfactorily.   Disposition:  Discharge to SNF. Likely tomorrow  Consultants: Nephrology, urology, ER Procedures: HD with IR guided line placement  Antibiotics: Anti-infectives    Start     Dose/Rate Route Frequency Ordered Stop   05/20/16 1000  acyclovir (ZOVIRAX) 200 MG capsule 200 mg     200 mg Oral Daily 05/19/16 1137     05/12/16 1000  acyclovir (ZOVIRAX) 200 MG capsule 200 mg  Status:  Discontinued     200 mg Oral 2 times daily 05/12/16 0532 05/19/16 1137   05/12/16 0900  vancomycin (VANCOCIN) 50 mg/mL oral solution 500 mg     500 mg Oral Every 6 hours 05/12/16 0748 05/26/16 0859   05/12/16 0600  metroNIDAZOLE (FLAGYL) IVPB 500 mg  Status:  Discontinued     500 mg 100 mL/hr over 60 Minutes Intravenous Every 8 hours 05/12/16 0532 05/22/16 0955   05/12/16 0330  metroNIDAZOLE (FLAGYL) tablet 500 mg     500 mg Oral  Once 05/12/16 0327 05/12/16 0338   05/12/16 0330  ciprofloxacin (CIPRO) tablet 500 mg     500 mg Oral  Once 05/12/16 0327 05/12/16 0338       Objective: Physical Exam: Vitals:   05/28/16 1424 05/28/16 2155 05/29/16 0618 05/29/16 1421  BP: 113/66 135/71 118/60 120/71  Pulse: 87 91 94 87  Resp: 19 18 18 19   Temp: 98.6 F (37 C) 98.8 F (37.1 C) 98 F (36.7 C) 98.7 F (37.1 C)  TempSrc:  Oral Oral   SpO2: 99% 96% 99% 100%  Weight:      Height:        Intake/Output Summary (Last 24 hours) at 05/29/16 1903 Last data filed at 05/29/16 1300  Gross per 24 hour  Intake              980 ml  Output                0 ml  Net              980 ml   Filed Weights   05/24/16 1300 05/26/16 0739 05/26/16 1153  Weight: 85.6 kg (188 lb 11.4 oz) 83 kg (182 lb 15.7 oz) 80.4 kg (177 lb 4 oz)   General: Alert, Awake and Oriented to Time, Place and Person. Appear in mild  distress, affect appropriate Eyes: PERRL, Conjunctiva normal ENT: Oral Mucosa clear moist. Neck: difficult to assess JVD, no Abnormal Mass Or lumps Cardiovascular: S1 and S2 Present, no Murmur, Respiratory: Bilateral Air entry equal and Decreased, no use of accessory muscle, Clear to Auscultation, no Crackles, no wheezes Abdomen: Bowel Sound present, Soft and mild tenderness Skin: no redness, no Rash, no induration Extremities: no Pedal edema, no calf tenderness Neurologic: Grossly no focal neuro deficit. Bilaterally Equal motor strength  Data Reviewed: CBC:  Recent Labs Lab 05/23/16 0721 05/24/16 1008 05/26/16 0757 05/28/16 0810  WBC 12.9* 10.4 7.8 4.1  HGB 10.4* 9.9* 9.8* 9.2*  HCT 29.9* 29.0* 28.3* 27.2*  MCV 81.0 81.7 83.0 84.2  PLT 260 235 179 510*   Basic Metabolic Panel:  Recent Labs Lab 05/23/16 0721 05/24/16 1009 05/26/16 0515 05/27/16 0644 05/28/16 0810 05/28/16 1933 05/29/16 0433 05/29/16 1233  NA 137 135 135 136 137 133* 135  --   K 3.1* 2.4* <2.0* 2.1* <2.0* 3.7 2.3* 3.1*  CL 104 104 104 104 108 108 110  --   CO2 23 23 22 25 23  16* 20*  --  GLUCOSE 133* 176* 114* 140* 121* 261* 207*  --   BUN 58* 65* 50* 30* 35* 40* 38*  --   CREATININE 3.41* 3.58* 2.94* 2.44* 2.56* 2.40* 2.36*  --   CALCIUM 7.2* 7.2* 7.1* 7.1* 7.2* 7.2* 7.3*  --   MG  --   --  2.2 1.9 2.0  --  1.8  --   PHOS 5.1* 5.2* 4.3  --  3.8  --  3.3  --     Liver Function Tests:  Recent Labs Lab 05/23/16 0721 05/24/16 1009 05/26/16 0515 05/28/16 0810 05/29/16 0433  ALBUMIN 1.7* 1.7* 1.7* 1.6* 1.6*   No results for input(s): LIPASE, AMYLASE in the last 168 hours. No results for input(s): AMMONIA in the last 168 hours. Coagulation Profile: No results for input(s): INR, PROTIME in the last 168 hours. Cardiac Enzymes: No results for input(s): CKTOTAL, CKMB, CKMBINDEX, TROPONINI in the last 168 hours. BNP (last 3 results) No results for input(s): PROBNP in the last 8760  hours. CBG:  Recent Labs Lab 05/28/16 1733 05/28/16 2156 05/29/16 0804 05/29/16 1202 05/29/16 1708  GLUCAP 215* 259* 158* 178* 157*   Studies: No results found.  Scheduled Meds: . acyclovir  200 mg Oral Daily  . atorvastatin  20 mg Oral Daily  . famotidine  20 mg Oral Daily  . feeding supplement (NEPRO CARB STEADY)  237 mL Oral BID BM  . feeding supplement (PRO-STAT SUGAR FREE 64)  30 mL Oral BID  . heparin subcutaneous  5,000 Units Subcutaneous Q8H  . insulin aspart  0-9 Units Subcutaneous TID WC  . insulin detemir  20 Units Subcutaneous QHS  . metoprolol succinate  75 mg Oral Daily  . potassium chloride  40 mEq Oral TID  . saccharomyces boulardii  250 mg Oral BID   Continuous Infusions: PRN Meds: acetaminophen **OR** acetaminophen, gi cocktail, heparin, HYDROmorphone (DILAUDID) injection, lidocaine (PF), nitroGLYCERIN, ondansetron **OR** ondansetron (ZOFRAN) IV, sodium chloride flush  Time spent: 30 minutes  Author: Berle Mull, MD [facility] Pager: [phone] 05/29/2016 7:03 PM  If 7PM-7AM, please contact night-coverage at [email or web address], password [facility]

## 2016-05-29 NOTE — Progress Notes (Signed)
[facility] ROUNDING NOTE   Subjective:   Interval History: Interval History:Interval History: 71 year old male with history of multiple myeloma on chemotherapy and recent mandibular abscess which was treated with clindamycin; presented to the emergency department secondary to crampy abdominal pain and multiple episode of diarrhea. Patient was found to have sepsis due to C. difficile colitis. His course has been now complicated with acute on chronic renal failure and oliguria requiring hemodialysis. Baseline creatinine 1.8  Objective:  Vital signs in last 24 hours:  Temp:  [98 F (36.7 C)-98.8 F (37.1 C)] 98 F (36.7 C) (05/24 0618) Pulse Rate:  [87-94] 94 (05/24 0618) Resp:  [18-19] 18 (05/24 0618) BP: (113-135)/(60-71) 118/60 (05/24 0618) SpO2:  [96 %-99 %] 99 % (05/24 0618)  Weight change:  Filed Weights   05/24/16 1300 05/26/16 0739 05/26/16 1153  Weight: 188 lb 11.4 oz (85.6 kg) 182 lb 15.7 oz (83 kg) 177 lb 4 oz (80.4 kg)    Intake/Output: I/O last 3 completed shifts: In: 33 [P.O.:360; I.V.:10; IV Piggyback:200] Out: -    Intake/Output this shift:  Total I/O In: 260 [P.O.:60; IV Piggyback:200] Out: -   CVS- RRR RS- CTA ABD- BS present soft non-distended EXT- no edema   Basic Metabolic Panel:  Recent Labs Lab 05/23/16 0721 05/24/16 1009 05/26/16 0515 05/27/16 0644 05/28/16 0810 05/28/16 1933 05/29/16 0433  NA 137 135 135 136 137 133* 135  K 3.1* 2.4* <2.0* 2.1* <2.0* 3.7 2.3*  CL 104 104 104 104 108 108 110  CO2 23 23 22 25 23  16* 20*  GLUCOSE 133* 176* 114* 140* 121* 261* 207*  BUN 58* 65* 50* 30* 35* 40* 38*  CREATININE 3.41* 3.58* 2.94* 2.44* 2.56* 2.40* 2.36*  CALCIUM 7.2* 7.2* 7.1* 7.1* 7.2* 7.2* 7.3*  MG  --   --  2.2 1.9 2.0  --  1.8  PHOS 5.1* 5.2* 4.3  --  3.8  --  3.3    Liver Function Tests:  Recent Labs Lab 05/23/16 0721 05/24/16 1009 05/26/16 0515 05/28/16 0810 05/29/16 0433  ALBUMIN 1.7* 1.7* 1.7* 1.6* 1.6*    No results for input(s): LIPASE, AMYLASE in the last 168 hours. No results for input(s): AMMONIA in the last 168 hours.  CBC:  Recent Labs Lab 05/23/16 0721 05/24/16 1008 05/26/16 0757 05/28/16 0810  WBC 12.9* 10.4 7.8 4.1  HGB 10.4* 9.9* 9.8* 9.2*  HCT 29.9* 29.0* 28.3* 27.2*  MCV 81.0 81.7 83.0 84.2  PLT 260 235 179 140*    Cardiac Enzymes: No results for input(s): CKTOTAL, CKMB, CKMBINDEX, TROPONINI in the last 168 hours.  BNP: Invalid input(s): POCBNP  CBG:  Recent Labs Lab 05/28/16 0803 05/28/16 1145 05/28/16 1733 05/28/16 2156 05/29/16 0804  GLUCAP 124* 130* 215* 259* 158*    Microbiology: Results for orders placed or performed during the hospital encounter of 05/11/16  C difficile quick scan w PCR reflex     Status: Abnormal   Collection Time: 05/12/16  4:00 AM  Result Value Ref Range Status   C Diff antigen POSITIVE (A) NEGATIVE Final   C Diff toxin POSITIVE (A) NEGATIVE Final   C Diff interpretation Toxin producing C. difficile detected.  Final    Comment: RESULT CALLED TO, READ BACK BY AND VERIFIED WITH: KAYLA PRICE AT 0726 ON May 12 2016 BY SHEILA WOOD     Coagulation Studies: No results for input(s): LABPROT, INR in the last 72 hours.  Urinalysis: No results for input(s): COLORURINE, LABSPEC, [place], [place],  HGBUR, BILIRUBINUR, KETONESUR, PROTEINUR, UROBILINOGEN, NITRITE, LEUKOCYTESUR in the last 72 hours.  Invalid input(s): APPERANCEUR    Imaging: No results found.   Medications:   . potassium chloride Stopped (05/29/16 0929)   . acyclovir  200 mg Oral Daily  . atorvastatin  20 mg Oral Daily  . famotidine  20 mg Oral Daily  . feeding supplement (NEPRO CARB STEADY)  237 mL Oral BID BM  . feeding supplement (PRO-STAT SUGAR FREE 64)  30 mL Oral BID  . heparin subcutaneous  5,000 Units Subcutaneous Q8H  . insulin aspart  0-9 Units Subcutaneous TID WC  . insulin detemir  20 Units Subcutaneous QHS  . metoprolol succinate  75 mg  Oral Daily  . potassium chloride  40 mEq Oral Q2H  . saccharomyces boulardii  250 mg Oral BID   acetaminophen **OR** acetaminophen, gi cocktail, heparin, HYDROmorphone (DILAUDID) injection, lidocaine (PF), nitroGLYCERIN, ondansetron **OR** ondansetron (ZOFRAN) IV, sodium chloride flush  Assessment/ Plan:  1. Renal- creatinine 5/5 was 1.67 but has worsened since he has been in house. I was hoping he was showing some signs of improvement but unfortunately it has not continued-. IR placed vascath 5/15, then First HD 5/16, second 5/17, third 5/19 and 5/21 to correct the uremia and also the volume overload. He has clinically improved. Given baseline crt good would hope only temporary and not permanent. I also have decreased valcyte to more appropriate dose based on renal function. No indications for dialysis this morning  Creatinine improved  Will sign off ( please call [phone] if further help needed )  2. HTN/vol-overloaded- BP marginal - on lopressor for tachycardia have stopped lasix as is starting HD- third spacing due to low albumin- weight dec since starting HD 3. Anemia- due to CKD and multiple myeloma- hgb 11.2>10.4>9.9- supportive care  4. Metabolic acidosis-on oral bicarb- will correct further with HD- have stopped pills  5.C Diff- still symptomatic- on oral vanc and flagyl  6. Hyperphos- treat only with HD for now 7. Nausea- due to c diff, meds or uremia- if due to uremia should be getting better- is really limiting his improvement 8. Hypokalemia-  Replete     LOS: 17 Zayda Angell W @TODAY @9 :31 AM

## 2016-05-29 NOTE — Clinical Social Work Note (Signed)
Clinical Social Work Assessment  Patient Details  Name: Douglas Rose. MRN: [id number] Date of Birth: [birth year]  Date of referral:  05/29/16               Reason for consult:  Facility Placement                Permission sought to share information with:  Facility Sport and exercise psychologist, Family Supports Permission granted to share information::  Yes, Verbal Permission Granted  Name::     Architectural technologist::  SNFs  Relationship::  Spouse  Contact Information:  [phone]  Housing/Transportation Living arrangements for the past 2 months:  Single Family Home Source of Information:  Spouse, Patient Patient Interpreter Needed:  None Criminal Activity/Legal Involvement Pertinent to Current Situation/Hospitalization:  No - Comment as needed Significant Relationships:  Spouse, Adult Children Lives with:  Spouse Do you feel safe going back to the place where you live?  No Need for family participation in patient care:  Yes (Comment)  Care giving concerns:  CSW received consult for possible SNF placement at time of discharge. CSW spoke with patient's wife regarding PT recommendation of SNF placement at time of discharge. Patient's wife now expressed that she may not be able to care for her husband until he gets his strength back. Patient's wife expressed understanding of PT recommendation and is now agreeable to SNF placement at time of discharge. CSW to continue to follow and assist with discharge planning needs.   Social Worker assessment / plan:  CSW spoke with patient and his wife concerning possibility of rehab at [facility] before returning home.  Employment status:  Retired Nurse, adult PT Recommendations:  [place] / Referral to community resources:  [place]  Patient/Family's Response to care:  Patient was very quiet and did not participate in out conversation. Patient's wife recognizes need for rehab before  returning home and is agreeable to a SNF in [facility]. Patient's wife reported preference for [facility]. She was hoping to take patient home at discharge, but is realizing that she does not have the appropriate help to do so. CSW explained insurance authorization process.  Patient/Family's Understanding of and Emotional Response to Diagnosis, Current Treatment, and Prognosis:  Patient/family is realistic regarding therapy needs and expressed being hopeful for SNF placement. Patient's spouse expressed understanding of CSW role and discharge process as well as patient's medical condition. No questions/concerns about plan or treatment.    Emotional Assessment Appearance:  Appears stated age Attitude/Demeanor/Rapport:  Lethargic Affect (typically observed):  Quiet Orientation:  Oriented to Self, Oriented to Situation, Oriented to Place, Oriented to  Time Alcohol / Substance use:  Not Applicable Psych involvement (Current and /or in the community):  No (Comment)  Discharge Needs  Concerns to be addressed:  Care Coordination Readmission within the last 30 days:  No Current discharge risk:  Dependent with Mobility Barriers to Discharge:  Continued Medical Work up   [employer], [employer] 05/29/2016, 10:57 AM

## 2016-05-29 NOTE — Care Management Note (Addendum)
Case Management Note  Patient Details  Name: Douglas Rose. MRN: [id number] Date of Birth: Feb 17, 1945  Subjective/Objective:    Sepsis d/t c-diff, ST, DM II, CKD                 Action/Plan: Discharge Planning: Chart reviewed. CSW following for SNF placement. Scheduled dc to SNF when medically stable.   PCP [name] MD  Expected Discharge Date:                 Expected Discharge Plan:  [place] (resides with wife)  In-House Referral:  Clinical Social Work  Discharge planning Services  CM Consult  Post Acute Care Choice:  NA Choice offered to:  NA  DME Arranged:  N/A DME Agency:  NA  HH Arranged:  NA HH Agency:  NA  Status of Service:  Completed, signed off  If discussed at [place] of Stay Meetings, dates discussed:  05/29/2016   Additional Comments:  Erenest Rasher, RN 05/29/2016, 9:04 AM

## 2016-05-29 NOTE — Progress Notes (Signed)
Physical Therapy Treatment Patient Details Name: Douglas Rose. MRN: [id number] DOB: 20-Oct-1945 Today's Date: 05/29/2016    History of Present Illness Douglas Rose. is a 71 y.o. male with history of multiple myeloma on chemotherapy, recent history of C. Diff colitis, who was placed on clindamycin for the last 3 weeks for mandibular abscess who presented to the ER with complaints of diffuse crampy abdominal pain with multiple episodes of diarrhea.  He was found to have sepsis due to C. Diff colitis.  His course has been complicated by ongoing sinus tachycardia and AKI.     PT Comments    Pt seen after being cleaned up by NT for loose stool.  During session, pt had another episode of loose stool in standing.  He is showing increased motivation and alertness, but continues to be deconditioned.  Con't to recommend SNF.   Follow Up Recommendations  SNF     Equipment Recommendations  None recommended by PT    Recommendations for Other Services       Precautions / Restrictions Precautions Precautions: Fall Precaution Comments: frequent loose stools Restrictions Weight Bearing Restrictions: No    Mobility  Bed Mobility Overal bed mobility: Needs Assistance Bed Mobility: Supine to Sit     Supine to sit: Min assist     General bed mobility comments: A with bed pad for hips and A for trunk  Transfers Overall transfer level: Needs assistance Equipment used: Rolling walker (2 wheeled) Transfers: Sit to/from [facility] Sit to Stand: Min assist;+2 safety/equipment;From elevated surface Stand pivot transfers: Min assist;+2 safety/equipment       General transfer comment: Stood on 2nd attempt with elevated bed with wife assisting.  Pt then began to have loose stool.  Stood at [employer] while wife cleaned pt then did SPT with cues for position within RW.  Legs shaky.  Ambulation/Gait             General Gait Details: too fatigued and with frequent loose  stools   Stairs            Wheelchair Mobility    Modified Rankin (Stroke Patients Only)       Balance   Sitting-balance support: Feet supported Sitting balance-Leahy Scale: Fair     Standing balance support: Bilateral upper extremity supported Standing balance-Leahy Scale: Poor                              Cognition Arousal/Alertness: Awake/alert Behavior During Therapy: WFL for tasks assessed/performed Overall Cognitive Status: Within Functional Limits for tasks assessed                                        Exercises      General Comments        Pertinent Vitals/Pain Pain Assessment: Faces Faces Pain Scale: Hurts little more Pain Location: perinium Pain Descriptors / Indicators: Burning;Sore Pain Intervention(s): Limited activity within patient's tolerance;Monitored during session;Repositioned    Home Living                      Prior Function            PT Goals (current goals can now be found in the care plan section) Acute Rehab PT Goals PT Goal Formulation: With patient Time For Goal Achievement: 06/05/16 Potential to Achieve Goals:  Fair Progress towards PT goals: Progressing toward goals    Frequency    Min 3X/week      PT Plan Current plan remains appropriate    Co-evaluation              AM-PAC PT "6 Clicks" Daily Activity  Outcome Measure  Difficulty turning over in bed (including adjusting bedclothes, sheets and blankets)?: Total Difficulty moving from lying on back to sitting on the side of the bed? : Total Difficulty sitting down on and standing up from a chair with arms (e.g., wheelchair, bedside commode, etc,.)?: Total Help needed moving to and from a bed to chair (including a wheelchair)?: A Little Help needed walking in hospital room?: A Lot Help needed climbing 3-5 steps with a railing? : A Lot 6 Click Score: 10    End of Session Equipment Utilized During Treatment: Gait  belt Activity Tolerance: Patient tolerated treatment well Patient left: in chair;with call bell/phone within reach;with family/visitor present   PT Visit Diagnosis: Unsteadiness on feet (R26.81);Muscle weakness (generalized) (M62.81);Other abnormalities of gait and mobility (R26.89)     Time: 1423-1440 PT Time Calculation (min) (ACUTE ONLY): 17 min  Charges:  $Therapeutic Activity: 8-22 mins                    G Codes:       [name], [place] Pager [phone] 05/29/2016    [name] 05/29/2016, 2:52 PM

## 2016-05-29 NOTE — Progress Notes (Signed)
PT Cancellation Note  Patient Details Name: Douglas Rose. MRN: [id number] DOB: [birth year]   Cancelled Treatment:    Reason Eval/Treat Not Completed: Medical issues which prohibited therapy. Pt just had port removed and RN not have therapy until > 30 mins.  Will check back as schedule permits.   [name] 05/29/2016, 12:41 PM

## 2016-05-29 NOTE — NC FL2 (Signed)
[place] LEVEL OF CARE SCREENING TOOL     IDENTIFICATION  Patient Name: Douglas Rose. Birthdate: [birth year] Sex: male Admission Date (Current Location): 05/11/2016  [facility] and [place] Number:  Herbalist and Address:  [facility]. [facility], [place] [address] [place], [place] [address]      Provider Number: [id number]  Attending Physician Name and Address:  Lavina Hamman, MD  Relative Name and Phone Number:  Cecil Cranker, spouse, [phone]    Current Level of Care: Hospital Recommended Level of Care: [place] Prior Approval Number:    Date Approved/Denied:   PASRR Number: [id number]  Discharge Plan: SNF    Current Diagnoses: Patient Active Problem List   Diagnosis Date Noted  . Weakness generalized   . Palliative care by specialist   . Sinus tachycardia 05/13/2016  . AKI (acute kidney injury) ([place])   . Enteritis due to Clostridium difficile   . Colitis 05/12/2016  . SIRS (systemic inflammatory response syndrome) ([place]) 05/12/2016  . Type 2 diabetes mellitus with vascular disease ([place]) 05/12/2016  . Multiple myeloma ([place]) 03/20/2016  . Acute renal failure ([place]) 03/06/2016  . Monoclonal gammopathy   . Protein-calorie malnutrition, severe 03/03/2016  . Pancreatitis 03/02/2016  . Enlarged prostate 03/02/2016  . Upper abdominal pain   . Urinary retention   . Acute renal failure superimposed on chronic kidney disease ([place]) 03/01/2016  . Abdominal fullness 02/26/2016  . Lymphadenopathy 11/21/2015  . Lipoma 07/19/2014  . Coronary artery disease   . Hyperlipidemia   . Hypertension   . H/O non-insulin dependent diabetes mellitus     Orientation RESPIRATION BLADDER Height & Weight     Self, Time, Situation, Place  Normal Incontinent Weight: 80.4 kg (177 lb 4 oz) Height:  _0  (180.3 cm)  BEHAVIORAL SYMPTOMS/MOOD NEUROLOGICAL BOWEL NUTRITION STATUS      Incontinent Diet (Please see DC Summary)   AMBULATORY STATUS COMMUNICATION OF NEEDS Skin   Extensive Assist Verbally Normal                       Personal Care Assistance Level of Assistance  Bathing, Feeding, Dressing Bathing Assistance: Maximum assistance Feeding assistance: Independent Dressing Assistance: Limited assistance     Functional Limitations Info             SPECIAL CARE FACTORS FREQUENCY        PT Frequency: 5x/week              Contractures      Additional Factors Info  Code Status, Allergies, Insulin Sliding Scale Code Status Info: Full Allergies Info: NKA   Insulin Sliding Scale Info: 3x daily with meals       Current Medications (05/29/2016):  This is the current hospital active medication list Current Facility-Administered Medications  Medication Dose Route Frequency Provider Last Rate Last Dose  . acetaminophen (TYLENOL) tablet 650 mg  650 mg Oral Q6H PRN Rise Patience, MD       Or  . acetaminophen (TYLENOL) suppository 650 mg  650 mg Rectal Q6H PRN Rise Patience, MD      . acyclovir (ZOVIRAX) 200 MG capsule 200 mg  200 mg Oral Daily Corliss Parish, MD   200 mg at 05/28/16 0855  . atorvastatin (LIPITOR) tablet 20 mg  20 mg Oral Daily Rise Patience, MD   20 mg at 05/28/16 0855  . famotidine (PEPCID) tablet 20 mg  20 mg Oral Daily  Barton Dubois, MD   20 mg at 05/28/16 2257  . feeding supplement (NEPRO CARB STEADY) liquid 237 mL  237 mL Oral BID BM Barton Dubois, MD   237 mL at 05/28/16 1428  . feeding supplement (PRO-STAT SUGAR FREE 64) liquid 30 mL  30 mL Oral BID Johnson, Clanford L, MD   30 mL at 05/28/16 2200  . gi cocktail (Maalox,Lidocaine,Donnatal)  30 mL Oral BID PRN Barton Dubois, MD      . heparin injection 5,000 Units  5,000 Units Subcutaneous Q8H Barton Dubois, MD   5,000 Units at 05/29/16 5051  . heparin injection   Intravenous PRN Aletta Edouard, MD   2.4 mL at 05/20/16 1551  . HYDROmorphone (DILAUDID) injection 0.5 mg  0.5 mg  Intravenous Q4H PRN Wynetta Emery, Clanford L, MD   0.5 mg at 05/21/16 2159  . insulin aspart (novoLOG) injection 0-9 Units  0-9 Units Subcutaneous TID WC Wynetta Emery, Clanford L, MD   2 Units at 05/29/16 0831  . insulin detemir (LEVEMIR) injection 20 Units  20 Units Subcutaneous QHS Barton Dubois, MD   20 Units at 05/28/16 2332  . lidocaine (PF) (XYLOCAINE) 1 % injection    PRN Aletta Edouard, MD   5 mL at 05/20/16 1550  . metoprolol succinate (TOPROL-XL) 24 hr tablet 75 mg  75 mg Oral Daily Janece Canterbury, MD   75 mg at 05/28/16 0855  . nitroGLYCERIN (NITROSTAT) SL tablet 0.4 mg  0.4 mg Sublingual Q5 Min x 3 PRN Rise Patience, MD      . ondansetron [facility]) tablet 4 mg  4 mg Oral Q6H PRN Rise Patience, MD   4 mg at 05/23/16 1528   Or  . ondansetron (ZOFRAN) injection 4 mg  4 mg Intravenous Q6H PRN Rise Patience, MD   4 mg at 05/14/16 0906  . potassium chloride 10 mEq in 100 mL IVPB  10 mEq Intravenous Q1 Hr x 4 Schorr, Rhetta Mura, NP   Stopped at 05/29/16 908 009 1496  . potassium chloride SA (K-DUR,KLOR-CON) CR tablet 40 mEq  40 mEq Oral Q2H Lavina Hamman, MD   40 mEq at 05/29/16 0747  . saccharomyces boulardii (FLORASTOR) capsule 250 mg  250 mg Oral BID Barton Dubois, MD   250 mg at 05/28/16 2333  . sodium chloride flush (NS) 0.9 % injection 10-40 mL  10-40 mL Intracatheter PRN Wynetta Emery, Clanford L, MD   10 mL at 05/27/16 2010     Discharge Medications: Please see discharge summary for a list of discharge medications.  Relevant Imaging Results:  Relevant Lab Results:   Additional Information SSN: [place], [place]

## 2016-05-29 NOTE — Progress Notes (Signed)
Patient's wife has chosen [facility]. CSW will need to get insurance authorization approval before patient discharges.   Percell Locus Kalia Vahey LCSWA [phone]

## 2016-05-30 DIAGNOSIS — E1122 Type 2 diabetes mellitus with diabetic chronic kidney disease: Secondary | ICD-10-CM

## 2016-05-30 LAB — CBC
HCT: 27.6 % — ABNORMAL LOW (ref 39.0–52.0)
HEMOGLOBIN: 9.3 g/dL — AB (ref 13.0–17.0)
MCH: 28.8 pg (ref 26.0–34.0)
MCHC: 33.7 g/dL (ref 30.0–36.0)
MCV: 85.4 fL (ref 78.0–100.0)
PLATELETS: 161 10*3/uL (ref 150–400)
RBC: 3.23 MIL/uL — AB (ref 4.22–5.81)
RDW: 21.8 % — ABNORMAL HIGH (ref 11.5–15.5)
WBC: 3 10*3/uL — AB (ref 4.0–10.5)

## 2016-05-30 LAB — RENAL FUNCTION PANEL
ALBUMIN: 1.7 g/dL — AB (ref 3.5–5.0)
ANION GAP: 5 (ref 5–15)
BUN: 35 mg/dL — ABNORMAL HIGH (ref 6–20)
CO2: 19 mmol/L — ABNORMAL LOW (ref 22–32)
CREATININE: 2.02 mg/dL — AB (ref 0.61–1.24)
Calcium: 7.5 mg/dL — ABNORMAL LOW (ref 8.9–10.3)
Chloride: 115 mmol/L — ABNORMAL HIGH (ref 101–111)
GFR, EST AFRICAN AMERICAN: 36 mL/min — AB (ref >60)
GFR, EST NON AFRICAN AMERICAN: 31 mL/min — AB (ref >60)
Glucose, Bld: 183 mg/dL — ABNORMAL HIGH (ref 65–99)
PHOSPHORUS: 3.2 mg/dL (ref 2.5–4.6)
Potassium: 2.7 mmol/L — CL (ref 3.5–5.1)
SODIUM: 139 mmol/L (ref 135–145)

## 2016-05-30 LAB — GLUCOSE, CAPILLARY
GLUCOSE-CAPILLARY: 162 mg/dL — AB (ref 65–99)
GLUCOSE-CAPILLARY: 177 mg/dL — AB (ref 65–99)
GLUCOSE-CAPILLARY: 162 mg/dL — AB (ref 65–99)
GLUCOSE-CAPILLARY: 166 mg/dL — AB (ref 65–99)

## 2016-05-30 MED ORDER — POTASSIUM CHLORIDE CRYS ER 10 MEQ PO TBCR
60.0000 meq | EXTENDED_RELEASE_TABLET | Freq: Three times a day (TID) | ORAL | Status: DC
  Administered 2016-05-30 – 2016-05-31 (×3): 60 meq via ORAL
  Filled 2016-05-30 (×3): qty 6

## 2016-05-30 MED ORDER — POTASSIUM CHLORIDE 10 MEQ/100ML IV SOLN
10.0000 meq | INTRAVENOUS | Status: AC
  Administered 2016-05-30 (×3): 10 meq via INTRAVENOUS
  Filled 2016-05-30 (×3): qty 100

## 2016-05-30 NOTE — Progress Notes (Addendum)
Insurance authorization received for patient to discharge Saturday to [place] (Ref# [id number]).   Douglas Rose LCSWA [phone]

## 2016-05-30 NOTE — Progress Notes (Signed)
[facility] Progress Note  Patient: Douglas Rose. HOZ:[id number]   PCP: [name], MD DOB: May 06, 1945   DOA: 05/11/2016   DOS: 05/30/2016   Date of Service: the patient was seen and examined on 05/30/2016  Subjective: Continues to have diarrhea, tolerating oral diet. No abdominal pain no fever no chills no cramps. No blood in the stool. Stool is semi-formed.  Brief hospital course: Pt. with PMH of multiple myeloma on chemotherapy, ending with an abscess on clindamycin, chronic kidney disease, type II DM; admitted on 05/11/2016, presented with complaint of diarrhea, was found to have C. difficile colitis with acute kidney injury. Currently further plan is in renal function recovery monitoring.  Assessment and Plan: 1-sepsis secondary to C. difficile colitis -Sepsis features essentially resolved -will continue supportive care and follow clinical response -no abd pain and no nausea or vomiting -stool is also more formed -patient completed 14 days of oral vancomycin and 10 days of IV flagyl -will continue florastor   2-sinus tachycardia: Resolved. 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 - patient fluid status also significantly improved - patient has completed four dialysis treatment now; last one 5/21 - patient Underlying chronic renal failure secondary to multiple myeloma and diabetes; His acute failure secondary to hypertension from acute sepsis and also the use of contrast. - Will minimize/avoid nephrotoxic agents - continue adequate hydration and avoid hypotension  - Nephrology  currently signed off, we remove the dialysis catheter as well. Change in her diet to cardiac diet.  5-paraphimosis -Stable and is steadily improving.  -Patient has been seen by urology and no further recommendation at this time (Foley has been discontinue and he was moderately reduced) -Anticipate that further control of his Volumestatus will also improve swelling and phimosis of his penis.   6-hypertension -Stable and well controlled. -Will continue metoprolol at current dose.  7-metabolic acidosis: Secondary to renal failure -Currently resolved -Will monitor  8-hypoalbuminemia with severe protein calorie malnutrition -Patient reports an improvement in his appetite -Will continue motivating and encouraged him for good nutrition and hydration by mouth -Continue feeding supplements.  9-anemia/thrombocytopenia: Due to chronic kidney disease and chemotherapy given for his multiple myeloma. -Hemoglobin has remained stable -No transfusion has been require -No active signs of acute bleeding -Will continue monitoring trend.  10-hypomagnesemia -Currently stable and within normal limits -Will monitor treatment  11-MM  -Condition is not in remission -Patient to continue outpatient follow-up with his oncologist after discharge; he was actively receiving chemotherapy prior to admission.  12-hypokalemia -Potassium low -Secondary to GI losses and decreased by mouth intake -We will provide electrolyte repletion and will follow trend mild increase scheduled dose to 60 mg 3 times a day for now. -Magnesium within normal limits -Majority of his electrolytes normally adjusted during dialysis treatment.  13-physical deconditioning -PT recommending SNF   14-GERD -will continue Pepcid  Diet: Cardiac diet DVT Prophylaxis: subcutaneous Heparin  Advance goals of care discussion: Full code  Family Communication: family was present at bedside, at the time of interview. The pt  provided permission to discuss medical plan with the family. Opportunity was given to ask question and all questions were answered satisfactorily.   Disposition:  Discharge to SNF tomorrow.  Consultants: Nephrology, palliative care, urology, I ER Procedures: Hemodialysis, IR guided line placement  Antibiotics: Anti-infectives    Start     Dose/Rate Route Frequency Ordered Stop   05/20/16 1000  acyclovir (ZOVIRAX) 200 MG capsule 200 mg     200 mg Oral Daily 05/19/16 1137     05/12/16 1000  acyclovir (ZOVIRAX) 200 MG capsule 200 mg  Status:  Discontinued     200 mg Oral 2 times daily 05/12/16 0532 05/19/16 1137   05/12/16 0900  vancomycin (VANCOCIN) 50 mg/mL oral solution 500 mg     500 mg Oral Every 6 hours 05/12/16 0748 05/26/16 0859   05/12/16 0600  metroNIDAZOLE (FLAGYL) IVPB 500 mg  Status:  Discontinued     500 mg 100 mL/hr over 60 Minutes Intravenous Every 8 hours 05/12/16 0532 05/22/16 0955   05/12/16 0330  metroNIDAZOLE (FLAGYL) tablet 500 mg     500 mg Oral  Once 05/12/16 0327 05/12/16 0338   05/12/16 0330  ciprofloxacin (CIPRO) tablet 500 mg     500 mg Oral  Once 05/12/16 0327 05/12/16 0338       Objective: Physical Exam: Vitals:   05/29/16 1421 05/29/16 2143 05/30/16 0633 05/30/16 1410  BP: 120/71 (!) 124/59 117/64 (!) 144/72  Pulse: 87 93 92 85  Resp: 19 17 18    Temp: 98.7 F (37.1 C) 98.3 F (36.8 C) 99.2 F (37.3 C) 98.3 F (36.8 C)  TempSrc:  Oral    SpO2: 100% 100% 99% 100%  Weight:      Height:        Intake/Output Summary (Last 24 hours) at 05/30/16 1726 Last data filed at 05/30/16 1228  Gross per 24 hour  Intake              300 ml  Output                0 ml  Net              300 ml   Filed Weights   05/24/16 1300 05/26/16 0739 05/26/16 1153  Weight: 85.6 kg (188 lb 11.4 oz) 83 kg (182 lb 15.7 oz) 80.4 kg (177 lb 4 oz)   General: Alert, Awake and Oriented to Time, Place and Person. Appear in mild distress, affect appropriate Eyes: PERRL,  Conjunctiva normal ENT: Oral Mucosa clear moist Neck: difficult to assess JVD, no Abnormal Mass Or lumps Cardiovascular: S1 and S2 Present, no Murmur, Respiratory: Bilateral Air entry equal and Decreased, no use of accessory muscle, Clear to Auscultation, no Crackles, no wheezes Abdomen: Bowel Sound present, Soft and no tenderness Skin: no redness, no Rash, no induration Extremities: no Pedal edema, no calf tenderness Neurologic: Grossly no focal neuro deficit. Bilaterally Equal motor strength  Data Reviewed: CBC:  Recent Labs Lab 05/24/16 1008 05/26/16 0757 05/28/16 0810 05/30/16 0809  WBC 10.4 7.8 4.1 3.0*  HGB 9.9* 9.8* 9.2* 9.3*  HCT 29.0* 28.3* 27.2* 27.6*  MCV 81.7 83.0 84.2 85.4  PLT 235 179 140* 270   Basic Metabolic Panel:  Recent Labs Lab 05/24/16 1009 05/26/16 0515 05/27/16 0644 05/28/16 0810 05/28/16 1933 05/29/16 0433 05/29/16 1233 05/30/16 0809  NA 135 135 136 137 133* 135  --  139  K 2.4* <2.0* 2.1* <2.0* 3.7 2.3* 3.1* 2.7*  CL 104 104 104 108 108 110  --  115*  CO2 23 22 25 23  16* 20*  --  19*  GLUCOSE 176* 114*  140* 121* 261* 207*  --  183*  BUN 65* 50* 30* 35* 40* 38*  --  35*  CREATININE 3.58* 2.94* 2.44* 2.56* 2.40* 2.36*  --  2.02*  CALCIUM 7.2* 7.1* 7.1* 7.2* 7.2* 7.3*  --  7.5*  MG  --  2.2 1.9 2.0  --  1.8  --   --   PHOS 5.2* 4.3  --  3.8  --  3.3  --  3.2    Liver Function Tests:  Recent Labs Lab 05/24/16 1009 05/26/16 0515 05/28/16 0810 05/29/16 0433 05/30/16 0809  ALBUMIN 1.7* 1.7* 1.6* 1.6* 1.7*   No results for input(s): LIPASE, AMYLASE in the last 168 hours. No results for input(s): AMMONIA in the last 168 hours. Coagulation Profile: No results for input(s): INR, PROTIME in the last 168 hours. Cardiac Enzymes: No results for input(s): CKTOTAL, CKMB, CKMBINDEX, TROPONINI in the last 168 hours. BNP (last 3 results) No results for input(s): PROBNP in the last 8760 hours. CBG:  Recent Labs Lab 05/29/16 1708  05/29/16 2141 05/30/16 0833 05/30/16 1229 05/30/16 1651  GLUCAP 157* 200* 162* 177* 162*   Studies: No results found.  Scheduled Meds: . acyclovir  200 mg Oral Daily  . atorvastatin  20 mg Oral Daily  . famotidine  20 mg Oral Daily  . feeding supplement (NEPRO CARB STEADY)  237 mL Oral BID BM  . feeding supplement (PRO-STAT SUGAR FREE 64)  30 mL Oral BID  . heparin subcutaneous  5,000 Units Subcutaneous Q8H  . insulin aspart  0-9 Units Subcutaneous TID WC  . insulin detemir  20 Units Subcutaneous QHS  . metoprolol succinate  75 mg Oral Daily  . potassium chloride  60 mEq Oral TID  . saccharomyces boulardii  250 mg Oral BID   Continuous Infusions: PRN Meds: acetaminophen **OR** acetaminophen, gi cocktail, heparin, HYDROmorphone (DILAUDID) injection, lidocaine (PF), nitroGLYCERIN, ondansetron **OR** ondansetron (ZOFRAN) IV, sodium chloride flush  Time spent: 35 minutes  Author: Berle Mull, MD [facility] Pager: [phone] 05/30/2016 5:26 PM  If 7PM-7AM, please contact night-coverage at [email or web address], password [facility]

## 2016-05-30 NOTE — Progress Notes (Signed)
CRITICAL VALUE ALERT  Critical Value:  K=2.7  Date & Time Notied:  05/30/2016; 09:14  Provider Notified: Dr. Posey Pronto  Orders Received/Actions taken: K - IV

## 2016-05-30 NOTE — Progress Notes (Signed)
Daily Progress Note   Patient Name: Douglas Rose.       Date: 05/30/2016 DOB: 1945/12/19  Age: 71 y.o. MRN#: [id number] Attending Physician: Lavina Hamman, MD Primary Care Physician: [name], MD Admit Date: 05/11/2016  Reason for Consultation/Follow-up: Establishing goals of care  Subjective: I met with Douglas Rose this AM.  He reports that he was doing well until being abruptly awoken to get his pills this AM.  He reports that since his time in the [place], he has always suffered from panic if someone abruptly awakens him.  He does not really want to discuss anything else as he is "still shaken" and stated when he is abruptly awoken, "I don't know if I am [place] or at [facility].  It just gets to me."  He does report that plan moving forward is to SNF for rehab, and he stated it may be that he has to reside at SNF from now on.  He doesn't want to discuss long term plans further as he is focusing on one day at a time.  Length of Stay: 18  Current Medications: Scheduled Meds:  . acyclovir  200 mg Oral Daily  . atorvastatin  20 mg Oral Daily  . famotidine  20 mg Oral Daily  . feeding supplement (NEPRO CARB STEADY)  237 mL Oral BID BM  . feeding supplement (PRO-STAT SUGAR FREE 64)  30 mL Oral BID  . heparin subcutaneous  5,000 Units Subcutaneous Q8H  . insulin aspart  0-9 Units Subcutaneous TID WC  . insulin detemir  20 Units Subcutaneous QHS  . metoprolol succinate  75 mg Oral Daily  . potassium chloride  40 mEq Oral TID  . saccharomyces boulardii  250 mg Oral BID    Continuous Infusions:   PRN Meds: acetaminophen **OR** acetaminophen, gi cocktail, heparin, HYDROmorphone (DILAUDID) injection, lidocaine (PF), nitroGLYCERIN, ondansetron **OR**  ondansetron (ZOFRAN) IV, sodium chloride flush  Physical Exam         General: Alert, awake, in no acute distress.  Frail.  Heart: S1,S2. No murmur appreciated. Lungs: Good air movement, clear Abdomen: Soft, nontender, positive bowel sounds.  Skin: Warm and dry Neuro: Grossly intact, nonfocal.   Vital Signs: BP 117/64 (BP Location: Left Arm)   Pulse 92   Temp  99.2 F (37.3 C)   Resp 18   Ht 5' 11"  (1.803 m)   Wt 80.4 kg (177 lb 4 oz)   SpO2 99%   BMI 24.72 kg/m  SpO2: SpO2: 99 % O2 Device: O2 Device: Not Delivered O2 Flow Rate:    Intake/output summary:  Intake/Output Summary (Last 24 hours) at 05/30/16 0900 Last data filed at 05/29/16 1300  Gross per 24 hour  Intake              820 ml  Output                0 ml  Net              820 ml   LBM: Last BM Date: 05/29/16 Baseline Weight: Weight: 76.2 kg (168 lb) Most recent weight: Weight: 80.4 kg (177 lb 4 oz)       Palliative Assessment/Data:    Flowsheet Rows     Most Recent Value  Intake Tab  Referral Department  Hospitalist  Unit at Time of Referral  Med/Surg Unit  Palliative Care Primary Diagnosis  Cancer  Date Notified  05/24/16  Palliative Care Type  New Palliative care  Reason for referral  Clarify Goals of Care  Date of Admission  05/11/16  Date first seen by Palliative Care  05/25/16  # of days Palliative referral response time  1 Day(s)  # of days IP prior to Palliative referral  13  Clinical Assessment  Psychosocial & Spiritual Assessment  Palliative Care Outcomes      Patient Active Problem List   Diagnosis Date Noted  . Weakness generalized   . Palliative care by specialist   . Sinus tachycardia 05/13/2016  . AKI (acute kidney injury) ([place])   . Enteritis due to Clostridium difficile   . Colitis 05/12/2016  . SIRS (systemic inflammatory response syndrome) ([place]) 05/12/2016  . Type 2 diabetes mellitus with vascular disease ([place]) 05/12/2016  . Multiple myeloma ([place]) 03/20/2016  . Acute  renal failure ([place]) 03/06/2016  . Monoclonal gammopathy   . Protein-calorie malnutrition, severe 03/03/2016  . Pancreatitis 03/02/2016  . Enlarged prostate 03/02/2016  . Upper abdominal pain   . Urinary retention   . Acute renal failure superimposed on chronic kidney disease ([place]) 03/01/2016  . Abdominal fullness 02/26/2016  . Lymphadenopathy 11/21/2015  . Lipoma 07/19/2014  . Coronary artery disease   . Hyperlipidemia   . Hypertension   . H/O non-insulin dependent diabetes mellitus     Palliative Care Assessment & Plan   Patient Profile: 71 yo male with multiple myeloma, abcess, CKD, DM admitted with c-diff colitis and AKI on CKD  Recommendations/Plan:  Discussed with staff his [place] history and that he panics when awoken from sleep.  Provided supportive listening to patient and discussed strategies to minimize this.  Plan to attempt to balance this with his care needs and time sensitive medications or procedures.    Plan for SNF to see how much functional status he can regain.  He is upset (not angry but not wanting to talk) this AM and declines to discuss long term plans further.  He may benefit from palliative f/u at SNF to continue conversations on long term [place] based upon his clinical course over the next few weeks.    Code Status:    Code Status Orders        Start     Ordered   05/12/16 0532  Full code  Continuous  05/12/16 0532    Code Status History    Date Active Date Inactive Code Status Order ID Comments User Context   03/02/2016  1:46 AM 03/04/2016  7:46 PM Full Code [id number]  Jule Ser, DO Inpatient   11/12/2012 11:50 PM 11/13/2012  8:27 PM Full Code [id number]  Cletus Gash, MD Inpatient    Advance Directive Documentation     Most Recent Value  Type of Advance Directive  Healthcare Power of Attorney  Pre-existing out of facility DNR order (yellow form or pink MOST form)  -  "MOST" Form in Place?  -       Prognosis:   Unable to  determine  Discharge Planning:  [place] for rehab with Palliative care service follow-up  Care plan was discussed with patient, RN, Dr. Posey Pronto  Thank you for allowing the Palliative Medicine Team to assist in the care of this patient.   Time In: 0835 Time Out: 0900 Total Time 25 Prolonged Time Billed No      Greater than 50%  of this time was spent counseling and coordinating care related to the above assessment and plan.  Micheline Rough, MD  Please contact Palliative Medicine Team phone at [phone] for questions and concerns.

## 2016-05-31 LAB — RENAL FUNCTION PANEL
Albumin: 1.7 g/dL — ABNORMAL LOW (ref 3.5–5.0)
Anion gap: 5 (ref 5–15)
BUN: 33 mg/dL — ABNORMAL HIGH (ref 6–20)
CHLORIDE: 118 mmol/L — AB (ref 101–111)
CO2: 16 mmol/L — AB (ref 22–32)
CREATININE: 1.78 mg/dL — AB (ref 0.61–1.24)
Calcium: 7.5 mg/dL — ABNORMAL LOW (ref 8.9–10.3)
GFR calc Af Amer: 42 mL/min — ABNORMAL LOW (ref >60)
GFR calc non Af Amer: 36 mL/min — ABNORMAL LOW (ref >60)
GLUCOSE: 157 mg/dL — AB (ref 65–99)
Phosphorus: 2.6 mg/dL (ref 2.5–4.6)
Potassium: 3.5 mmol/L (ref 3.5–5.1)
Sodium: 139 mmol/L (ref 135–145)

## 2016-05-31 LAB — GLUCOSE, CAPILLARY
GLUCOSE-CAPILLARY: 244 mg/dL — AB (ref 65–99)
GLUCOSE-CAPILLARY: 149 mg/dL — AB (ref 65–99)
Glucose-Capillary: 146 mg/dL — ABNORMAL HIGH (ref 65–99)
Glucose-Capillary: 229 mg/dL — ABNORMAL HIGH (ref 65–99)

## 2016-05-31 MED ORDER — INSULIN GLARGINE 100 UNIT/ML SOLOSTAR PEN: 20.0000 [IU] | PEN_INJECTOR | Freq: Every day | SUBCUTANEOUS | 11 refills | Status: DC

## 2016-05-31 MED ORDER — PRO-STAT SUGAR FREE PO LIQD: 30.0000 mL | Freq: Two times a day (BID) | ORAL | 0 refills | Status: DC

## 2016-05-31 MED ORDER — COLESTIPOL HCL 1 G PO TABS: 1.0000 g | ORAL_TABLET | Freq: Two times a day (BID) | ORAL | 0 refills | Status: DC

## 2016-05-31 MED ORDER — SACCHAROMYCES BOULARDII 250 MG PO CAPS: 250.0000 mg | ORAL_CAPSULE | Freq: Two times a day (BID) | ORAL | 0 refills | Status: DC

## 2016-05-31 MED ORDER — POTASSIUM CHLORIDE 20 MEQ/15ML (10%) PO SOLN
40.0000 meq | Freq: Three times a day (TID) | ORAL | Status: DC
  Administered 2016-05-31 (×2): 40 meq via ORAL
  Filled 2016-05-31 (×2): qty 30

## 2016-05-31 MED ORDER — COLESTIPOL HCL 1 G PO TABS
1.0000 g | ORAL_TABLET | Freq: Two times a day (BID) | ORAL | Status: DC
  Administered 2016-05-31 (×2): 1 g via ORAL
  Filled 2016-05-31 (×3): qty 1

## 2016-05-31 MED ORDER — POTASSIUM CHLORIDE 20 MEQ/15ML (10%) PO SOLN: 40.0000 meq | Freq: Three times a day (TID) | ORAL | 0 refills | Status: DC

## 2016-05-31 NOTE — Care Management Note (Signed)
Case Management Note  Patient Details  Name: Douglas Rose. MRN: [id number] Date of Birth: 01-Dec-1945  Subjective/Objective:                  C. difficile colitis with acute kidney injury Action/Plan: Discharge planning Expected Discharge Date:                  Expected Discharge Plan:  [place] (resides with wife)  In-House Referral:  Clinical Social Work  Ship broker  CM Consult  Post Acute Care Choice:  Museum/gallery conservator, Home Health Choice offered to:  NA, Spouse  DME Arranged:  3-N-1, Programmer, multimedia, Hospital bed DME Agency:  NA, [place]:  RN, PT, OT, Nurse's Aide, Social Work [employer] Agency:  [place]  Status of Service:  In process, will continue to follow  If discussed at Long Length of Stay Meetings, dates discussed:    Additional Comments: CM spoke with pt's spouse who chooses [facility] to render HHPT/OT/RN/Aide/SW and provide hospital bed, wheelchair, 3n1.  CM will set up PTAR once DME has been delivered to home.   Dellie Catholic, RN 05/31/2016, 12:46 PM

## 2016-06-01 LAB — RENAL FUNCTION PANEL
ANION GAP: 4 — AB (ref 5–15)
Albumin: 1.7 g/dL — ABNORMAL LOW (ref 3.5–5.0)
BUN: 25 mg/dL — AB (ref 6–20)
CHLORIDE: 121 mmol/L — AB (ref 101–111)
CO2: 18 mmol/L — ABNORMAL LOW (ref 22–32)
Calcium: 7.6 mg/dL — ABNORMAL LOW (ref 8.9–10.3)
Creatinine, Ser: 1.6 mg/dL — ABNORMAL HIGH (ref 0.61–1.24)
GFR, EST AFRICAN AMERICAN: 48 mL/min — AB (ref >60)
GFR, EST NON AFRICAN AMERICAN: 41 mL/min — AB (ref >60)
Glucose, Bld: 115 mg/dL — ABNORMAL HIGH (ref 65–99)
PHOSPHORUS: 2.7 mg/dL (ref 2.5–4.6)
POTASSIUM: 3.4 mmol/L — AB (ref 3.5–5.1)
Sodium: 143 mmol/L (ref 135–145)

## 2016-06-01 MED ORDER — COLESTIPOL HCL 1 G PO TABS
2.0000 g | ORAL_TABLET | Freq: Two times a day (BID) | ORAL | Status: DC
  Filled 2016-06-01: qty 2

## 2016-06-01 NOTE — Progress Notes (Addendum)
[facility] PROGRESS NOTE  Patient: Douglas Rose. YWX:[id number]   PCP: [name], MD DOB: 07-Jun-1945   DOA: 05/11/2016   DOS: 06/01/2016    Subjective: feeling more stronger, still has soft stools, no abdominal pain.  Objective:   05/31/16 2338  BP: (!) 148/70  Pulse: 95  Resp: 18  Temp: 98.8 F (37.1 C)    General: Alert, Awake and Oriented to Time, Place and Person. Appear in mild distress, affect appropriate ENT: Oral Mucosa clear moist. Neck: difficult to assess JVD, no Abnormal Mass Or lumps Cardiovascular: S1 and S2 Present, no Murmur, Peripheral Pulses Present Respiratory: Bilateral Air entry equal and Decreased, no use of accessory muscle, Clear to Auscultation, no Crackles, no wheezes Abdomen: Bowel Sound present, Soft and no tenderness Skin: redness no, no Rash, no induration Extremities: no Pedal edema, no calf tenderness  Labs reviewed  Hypokalemia better, S creat better as well.   Assessment and plan: 1-sepsis secondary to C. difficile colitis -Sepsis features essentially resolved -will continue supportive care and follow clinical response -no abd pain and no nausea or vomiting -stool is also more formed -patient completed 14 days of oral vancomycin and 10 days of IV flagyl -will continue florastor  - add colestipol for adjunct treatment and chronic diarrhea.  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 - patient fluid status also significantly improved - patient has completed four dialysis treatment now; last one 5/21 - patient Underlying chronic renal failure secondary to multiple myeloma and diabetes; His acute failure secondary to hypertension from acute sepsis and also the use of contrast. - Will minimize/avoid nephrotoxic agents - continue adequate hydration and avoid hypotension  - Nephrology currently  signed off, we remove the dialysis catheter as well. Continue cardiac diet.   3. hypokalemia -Potassium low -Secondary to GI losses and decreased by mouth intake - scheduled dose to 60 mg 3 times a day for now. Change formulation from pills to PO liquid as I saw pills in the stool coming out undisolved.  -Magnesium within normal limits  4. Type 2 DM CBG mildly elevated  Continue sliding scale.   Author: Berle Mull, MD [facility] Pager: [phone] 06/01/2016 8:08 AM   If 7PM-7AM, please contact night-coverage at [email or web address], password [facility]

## 2016-06-01 NOTE — Progress Notes (Signed)
Patient discharge teaching given, including activity, diet, follow-up appoints, and medications. Patient verbalized understanding of all discharge instructions. IV access was d/c'd. Vitals are stable. Skin is intact except as charted in most recent assessments. Pt to be escorted via PTAR to residential home.

## 2016-06-01 NOTE — Progress Notes (Signed)
At 2230, patient's daughter at bedside requesting for her father, the patient, to stay another night in the hospital due to [facility] not being able to give nursing staff an ETA to pick up to the patient to transport him home. RN notified Ortiz,MD and he agreed with the patient's family stating that sending a patient home at this time at night does pose as a safety risk for the patient and the family. Patient and patient's family are happy and satisfied with patient staying another night in the hospital. PTAR arrived to transport patient at 2300 but family refused and wanted to wait for their father to be transported home tomorrow. Will continue to monitor and treat per MD orders.

## 2016-06-03 ENCOUNTER — Encounter: Payer: Self-pay | Admitting: *Deleted

## 2016-06-03 NOTE — Discharge Summary (Signed)
[facility] Discharge Summary   Patient: Douglas Rose. SEG:[id number]   PCP: [name], MD DOB: 14-Mar-1945   Date of admission: 05/11/2016   Date of discharge: 06/01/2016     Discharge Diagnoses:  Principal Problem:   SIRS (systemic inflammatory response syndrome) (HCC) Active Problems:   Coronary artery disease   Hyperlipidemia   Hypertension   Multiple myeloma ([place])   Colitis   Type 2 diabetes mellitus with vascular disease ([place])   Sinus tachycardia   AKI (acute kidney injury) ([place])   Enteritis due to Clostridium difficile   Weakness generalized   Palliative care by specialist   Admitted From: home Disposition:  Home with home health  Recommendations for Outpatient Follow-up:  1. Please follow up with PCP in 1 week    Contact information for follow-up providers    [facility] Follow [facility].   Why:  Your home health agency and provider of your equipment Contact information: [address] [place] [address] [phone]        [name], MD. Schedule an appointment as soon as possible for a visit in 1 week(s).   Specialty:  Family Medicine Why:  get BMP checked on wednesday, adjust potassium.  Contact information: [place] [address] [phone]            Contact information for after-discharge care    [facility] .   Specialty:  [place] information: [address] [phone]                 Diet recommendation: cardiac diet  Activity: The patient is advised to gradually reintroduce usual activities.  Discharge Condition: good  Code Status: full code  History of present illness: As per the H and P dictated on admission, " Douglas Rose. is a 71 y.o. male with history of multiple myeloma recently diagnosed and chemotherapy last chemotherapy 3 days ago and  recently placed on clindamycin for the last 3 weeks for mandibular abscess presents to the ER with complaints of diffuse crampy abdominal pain with multiple episodes of diarrhea. Patient had one episode of nausea and vomiting. Denies any fever or chills. Over the last 4 weeks patient has been on chemotherapy for multiple myeloma. Patient states over the last 24 hours patient also had some exertional chest pressure. No chest pain on resting.  "  Hospital Course:  Summary of his active problems in the hospital is as following. 1-sepsis secondary to C. difficile colitis -Sepsis features essentially resolved -no abd pain and no nausea or vomiting -stool is also more formed -patient completed 14 days of oral vancomycin and 10 days of IV flagyl -will continue florastor  - add colestipol for adjunct treatment and chronic diarrhea.  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 - patient fluid status also significantly improved - patient has completed four dialysis treatment now; last one 5/21 - patient has underlying chronic renal failure secondary to multiple myeloma and diabetes; His acute failure secondary to hypotension from acute sepsis and also the use of contrast. - continue adequate hydration and avoid hypotension  - Nephrology currently signed off, we remove the dialysis catheter as well. Continue cardiac diet.   3. hypokalemia -Secondary to GI losses and decreased by mouth intake - scheduled dose  to 60 mg 3 times a day for now. Change formulation from pills to PO liquid as I saw pills in the stool coming out undisolved.  -Magnesium within normal limits  4. Type 2 DM Continue home regimen  5. Goals of care discussion  Palliative care consulted Pt curenlty progressing well a=with treatment and famil wants to continue current treatment   6. Physical  deconditioning PT recommended SNF but family wanted to take the pt home with home health.   All other chronic medical condition were stable during the hospitalization.  Patient was seen by physical therapy, who recommended home health, which was arranged by Education officer, museum and case Freight forwarder. On the day of the discharge the patient's vitals were stable, and no other acute medical condition were reported by patient. the patient was felt safe to be discharge at home with home health.  Procedures and Results:  HD   Consultations:  Nephrology   DISCHARGE MEDICATION: Discharge Medication List as of 05/31/2016  7:41 PM    START taking these medications   Details  Amino Acids-Protein Hydrolys (FEEDING SUPPLEMENT, PRO-STAT SUGAR FREE 64,) LIQD Take 30 mLs by mouth 2 (two) times daily., Starting Sat 05/31/2016, Normal    colestipol (COLESTID) 1 g tablet Take 1 tablet (1 g total) by mouth 2 (two) times daily., Starting Sat 05/31/2016, Until Thu 06/12/2016, Normal    potassium chloride 20 MEQ/15ML (10%) SOLN Take 30 mLs (40 mEq total) by mouth 3 (three) times daily., Starting Sat 05/31/2016, Normal    saccharomyces boulardii (FLORASTOR) 250 MG capsule Take 1 capsule (250 mg total) by mouth 2 (two) times daily., Starting Sat 05/31/2016, Normal      CONTINUE these medications which have CHANGED   Details  Insulin Glargine (LANTUS SOLOSTAR) 100 UNIT/ML Solostar Pen Inject 20 Units into the skin daily., Starting Sat 05/31/2016, Normal      CONTINUE these medications which have NOT CHANGED   Details  acyclovir (ZOVIRAX) 200 MG capsule Take 1 capsule (200 mg total) by mouth 2 (two) times daily., Starting Tue 04/08/2016, Normal    aspirin 81 MG tablet Take 81 mg by mouth daily., Historical Med    atorvastatin (LIPITOR) 20 MG tablet Take 1 tablet (20 mg total) by mouth daily., Starting Mon 12/06/2012, Normal    chlorhexidine (PERIDEX) 0.12 % solution Use as directed 15 mLs in the mouth or throat 2 (two)  times daily., Starting Tue 04/15/2016, Normal    insulin aspart (NOVOLOG) 100 UNIT/ML FlexPen Three times a day before meals per sliding scale. between 2-6 on average, Historical Med    metoprolol succinate (TOPROL-XL) 25 MG 24 hr tablet Take 3 tablets (75 mg total) by mouth daily. Take with or immediately following a meal., Starting Wed 03/05/2016, Normal    nitroGLYCERIN (NITROSTAT) 0.4 MG SL tablet Place 1 tablet (0.4 mg total) under the tongue every 5 (five) minutes x 3 doses as needed for chest pain., Starting Sat 11/13/2012, Normal    ondansetron (ZOFRAN) 8 MG tablet Take 1 tablet (8 mg total) by mouth 2 (two) times daily as needed for refractory nausea / vomiting., Starting Mon 03/31/2016, Normal    prochlorperazine (COMPAZINE) 10 MG tablet Take 1 tablet (10 mg total) by mouth every 6 (six) hours as needed (Nausea or vomiting)., Starting Mon 03/31/2016, Normal    tamsulosin (FLOMAX) 0.4 MG CAPS capsule Take 1 capsule (0.4 mg total) by mouth daily after supper., Starting Tue 03/04/2016, Normal      STOP taking these medications  amLODipine (NORVASC) 10 MG tablet      cyclophosphamide (CYTOXAN) 50 MG tablet      dexamethasone (DECADRON) 4 MG tablet      docusate sodium (COLACE) 100 MG capsule        No Known Allergies Discharge Instructions    Diet - low sodium heart healthy    Complete by:  As directed    Diet Carb Modified    Complete by:  As directed    Discharge instructions    Complete by:  As directed    It is important that you read following instructions as well as go over your medication list with RN to help you understand your care after this hospitalization.  Discharge Instructions: Please follow-up with PCP in one week  Please request your primary care physician to go over all Hospital Tests and Procedure/Radiological results at the follow up,  Please get all Hospital records sent to your PCP by signing hospital release before you go home.   Do not drive,  operating heavy machinery, perform activities at heights, swimming or participation in water activities or provide baby sitting services; until you have been seen by Primary Care Physician or a Neurologist and advised to do so again. Do not take more than prescribed Pain, Sleep and Anxiety Medications. You were cared for by a hospitalist during your hospital stay. If you have any questions about your discharge medications or the care you received while you were in the hospital after you are discharged, you can call the unit and ask to speak with the hospitalist on call if the hospitalist that took care of you is not available.  Once you are discharged, your primary care physician will handle any further medical issues. Please note that NO REFILLS for any discharge medications will be authorized once you are discharged, as it is imperative that you return to your primary care physician (or establish a relationship with a primary care physician if you do not have one) for your aftercare needs so that they can reassess your need for medications and monitor your lab values. You Must read complete instructions/literature along with all the possible adverse reactions/side effects for all the Medicines you take and that have been prescribed to you. Take any new Medicines after you have completely understood and accept all the possible adverse reactions/side effects. Wear Seat belts while driving. If you have smoked or chewed Tobacco in the last 2 yrs please stop smoking and/or stop any Recreational drug use.   Increase activity slowly    Complete by:  As directed      Discharge Exam: Filed Weights   05/24/16 1300 05/26/16 0739 05/26/16 1153  Weight: 85.6 kg (188 lb 11.4 oz) 83 kg (182 lb 15.7 oz) 80.4 kg (177 lb 4 oz)   Vitals:   05/31/16 2338 06/01/16 0604  BP: (!) 148/70 (!) 152/76  Pulse: 95 98  Resp: 18 18  Temp: 98.8 F (37.1 C) 98.3 F (36.8 C)   General: Appear in no distress, no Rash; Oral  Mucosa moist. Cardiovascular: S1 and S2 Present, aortic systolic Murmur, no JVD Respiratory: Bilateral Air entry present and Clear to Auscultation, no Crackles, no wheezes Abdomen: Bowel Sound present, Soft and no tenderness Extremities: no Pedal edema, no calf tenderness Neurology: Grossly no focal neuro deficit.  The results of significant diagnostics from this hospitalization (including imaging, microbiology, ancillary and laboratory) are listed below for reference.    Significant Diagnostic Studies: Dg Chest 2 View  Result  Date: 05/12/2016 CLINICAL DATA:  Diffuse abdominal pain and fever tonight, chest pain. EXAM: CHEST  2 VIEW COMPARISON:  Chest radiograph May 11, 2016 FINDINGS: Cardiomediastinal silhouette is normal. No pleural effusions or focal consolidations. Trachea projects midline and there is no pneumothorax. Soft tissue planes and included osseous structures are non-suspicious. Multiple EKG lines overlie the patient and may obscure subtle underlying pathology. IMPRESSION: Stable examination:  No acute cardiopulmonary process. Electronically Signed   By: Elon Alas M.D.   On: 05/12/2016 02:57   Ct Abdomen Pelvis W Contrast  Result Date: 05/12/2016 CLINICAL DATA:  Constipation since yesterday, took MiraLax and now has uncontrollable diarrhea and vomiting. Diffuse abdominal pain. History of multiple myeloma, diabetes. EXAM: CT ABDOMEN AND PELVIS WITH CONTRAST TECHNIQUE: Multidetector CT imaging of the abdomen and pelvis was performed using the standard protocol following bolus administration of intravenous contrast. CONTRAST:  143m ISOVUE-300 IOPAMIDOL (ISOVUE-300) INJECTION 61% COMPARISON:  Abdominal radiograph May 11, 2016 and CT abdomen and pelvis April 21, 2016 FINDINGS: LOWER CHEST: Lung bases are clear. Included heart size is normal. No pericardial effusion. HEPATOBILIARY: Liver and gallbladder are normal. PANCREAS: Normal. SPLEEN: Normal. ADRENALS/URINARY TRACT: Kidneys are  orthotopic, demonstrating symmetric enhancement. No nephrolithiasis, hydronephrosis or solid renal masses. Too small to characterize hypodensities in the kidneys bilaterally. The unopacified ureters are normal in course and caliber. Delayed imaging through the kidneys demonstrates symmetric prompt contrast excretion within the proximal urinary collecting system. Urinary bladder is partially distended. Normal adrenal glands. STOMACH/BOWEL: Severe general colonic wall thickening and edema with pericolonic inflammation. Very small hiatal hernia. The stomach, small bowel are normal in course and caliber without inflammatory changes. Normal appendix. VASCULAR/LYMPHATIC: Aortoiliac vessels are normal in course and caliber, mild to moderate calcific atherosclerosis. No lymphadenopathy by CT size criteria. REPRODUCTIVE: Prostatomegaly invading the base of the bladder. OTHER: Small to moderate low-density free fluid without focal fluid collection or intraperitoneal free air. MUSCULOSKELETAL: Nonacute. Bridging RIGHT sacroiliac osteophyte. Severe L5-S1 facet arthropathy. IMPRESSION: Severe colitis, recommend correlation with stool sampling (Clostridium difficile infection can have this appearance). Small to moderate ascites. Electronically Signed   By: CElon AlasM.D.   On: 05/12/2016 02:54   UKoreaRenal  Result Date: 05/13/2016 CLINICAL DATA:  Acute kidney injury 05/08/2016. EXAM: RENAL / URINARY TRACT ULTRASOUND COMPLETE COMPARISON:  CT abdomen and pelvis 05/12/2016. FINDINGS: Right Kidney: Length: 10.2 cm. Echogenicity within normal limits. No mass or hydronephrosis visualized. Left Kidney: Length: 10.3 cm. Echogenicity within normal limits. No mass or hydronephrosis visualized. Bladder: Appears normal for degree of bladder distention. Small bilateral pleural effusions and ascites are identified. IMPRESSION: Negative for hydronephrosis.  Normal appearing kidneys. Ascites and small bilateral pleural effusions  Electronically Signed   By: TInge RiseM.D.   On: 05/13/2016 12:36   Ir Fluoro Guide Cv Line Right  Result Date: 05/20/2016 CLINICAL DATA:  Renal failure and need for temporary hemodialysis catheter. EXAM: NON-TUNNELED CENTRAL VENOUS CATHETER PLACEMENT WITH ULTRASOUND AND FLUOROSCOPIC GUIDANCE FLUOROSCOPY TIME:  Less than 6 seconds. PROCEDURE: The procedure, risks, benefits, and alternatives were explained to the patient. Questions regarding the procedure were encouraged and answered. The patient understands and consents to the procedure. The right neck and chest were prepped with chlorhexidine in a sterile fashion, and a sterile drape was applied covering the operative field. Maximum barrier sterile technique with sterile gowns and gloves were used for the procedure. Local anesthesia was provided with 1% lidocaine. After creating a small venotomy incision, a 21 gauge needle was advanced into  the right internal jugular vein under direct, real-time ultrasound guidance. Ultrasound image documentation was performed. After securing guidewire access, the venotomy was dilated. A 13 French, 20 cm non tunneled dialysis catheter was then advanced over the wire. Final catheter positioning was confirmed and documented with a fluoroscopic spot image. The catheter was aspirated, flushed with saline, and injected with appropriate volume heparin dwells. The catheter exit site was secured with 0-Prolene retention sutures. COMPLICATIONS: None.  No pneumothorax. FINDINGS: After catheter placement, the tip lies at the cavoatrial junction. The catheter aspirates normally and is ready for immediate use. IMPRESSION: Placement of non-tunneled central venous catheter via the right internal jugular vein. The catheter tip lies at the cavoatrial junction. The catheter is ready for immediate use. Electronically Signed   By: Aletta Edouard M.D.   On: 05/20/2016 16:49   Ir US Guide Vasc Access Right  Result Date:  05/20/2016 CLINICAL DATA:  Renal failure and need for temporary hemodialysis catheter. EXAM: NON-TUNNELED CENTRAL VENOUS CATHETER PLACEMENT WITH ULTRASOUND AND FLUOROSCOPIC GUIDANCE FLUOROSCOPY TIME:  Less than 6 seconds. PROCEDURE: The procedure, risks, benefits, and alternatives were explained to the patient. Questions regarding the procedure were encouraged and answered. The patient understands and consents to the procedure. The right neck and chest were prepped with chlorhexidine in a sterile fashion, and a sterile drape was applied covering the operative field. Maximum barrier sterile technique with sterile gowns and gloves were used for the procedure. Local anesthesia was provided with 1% lidocaine. After creating a small venotomy incision, a 21 gauge needle was advanced into the right internal jugular vein under direct, real-time ultrasound guidance. Ultrasound image documentation was performed. After securing guidewire access, the venotomy was dilated. A 13 French, 20 cm non tunneled dialysis catheter was then advanced over the wire. Final catheter positioning was confirmed and documented with a fluoroscopic spot image. The catheter was aspirated, flushed with saline, and injected with appropriate volume heparin dwells. The catheter exit site was secured with 0-Prolene retention sutures. COMPLICATIONS: None.  No pneumothorax. FINDINGS: After catheter placement, the tip lies at the cavoatrial junction. The catheter aspirates normally and is ready for immediate use. IMPRESSION: Placement of non-tunneled central venous catheter via the right internal jugular vein. The catheter tip lies at the cavoatrial junction. The catheter is ready for immediate use. Electronically Signed   By: Aletta Edouard M.D.   On: 05/20/2016 16:49   Dg Chest Port 1 View  Result Date: 05/17/2016 CLINICAL DATA:  71 year old male with leukocytosis and general malaise EXAM: PORTABLE CHEST 1 VIEW COMPARISON:  Prior chest x-ray  05/12/2016 FINDINGS: Bilateral basilar hazy airspace opacities most consistent with bilateral layering pleural effusions and associated atelectasis. The cardiac and mediastinal contours are unchanged. The visualized upper abdominal bowel gas pattern is unremarkable. No acute osseous abnormality. Inspiratory volumes are low. IMPRESSION: 1. Layering right pleural effusion and associated atelectasis. 2. Nonspecific left basilar opacity may also reflect pleural fluid with atelectasis. Superimposed airspace infiltrate/pneumonia is difficult to exclude on the left. Electronically Signed   By: Jacqulynn Cadet M.D.   On: 05/17/2016 14:41   Dg Abd Acute W/chest  Result Date: 05/11/2016 CLINICAL DATA:  Abdominal pain and constipation EXAM: DG ABDOMEN ACUTE W/ 1V CHEST COMPARISON:  None. FINDINGS: There is a generous volume of air an small bowel, more likely nonobstructive. No extraluminal gas. Stool and air is present in the colon. No biliary or urinary calculi. Upright view of the chest is negative for acute cardiopulmonary abnormality. IMPRESSION: Generous volume  of air throughout small bowel. There also is stool and air in the colon. No evidence of bowel obstruction or perforation. Electronically Signed   By: Andreas Newport M.D.   On: 05/11/2016 00:45    Microbiology: No results found for this or any previous visit (from the past 240 hour(s)).   Labs: CBC:  Recent Labs Lab 05/28/16 0810 05/30/16 0809  WBC 4.1 3.0*  HGB 9.2* 9.3*  HCT 27.2* 27.6*  MCV 84.2 85.4  PLT 140* 325   Basic Metabolic Panel:  Recent Labs Lab 05/28/16 0810 05/28/16 1933 05/29/16 0433 05/29/16 1233 05/30/16 0809 05/31/16 0329 06/01/16 0435  NA 137 133* 135  --  139 139 143  K <2.0* 3.7 2.3* 3.1* 2.7* 3.5 3.4*  CL 108 108 110  --  115* 118* 121*  CO2 23 16* 20*  --  19* 16* 18*  GLUCOSE 121* 261* 207*  --  183* 157* 115*  BUN 35* 40* 38*  --  35* 33* 25*  CREATININE 2.56* 2.40* 2.36*  --  2.02* 1.78* 1.60*   CALCIUM 7.2* 7.2* 7.3*  --  7.5* 7.5* 7.6*  MG 2.0  --  1.8  --   --   --   --   PHOS 3.8  --  3.3  --  3.2 2.6 2.7   Liver Function Tests:  Recent Labs Lab 05/28/16 0810 05/29/16 0433 05/30/16 0809 05/31/16 0329 06/01/16 0435  ALBUMIN 1.6* 1.6* 1.7* 1.7* 1.7*   No results for input(s): LIPASE, AMYLASE in the last 168 hours. No results for input(s): AMMONIA in the last 168 hours. Cardiac Enzymes: No results for input(s): CKTOTAL, CKMB, CKMBINDEX, TROPONINI in the last 168 hours. BNP (last 3 results) No results for input(s): BNP in the last 8760 hours. CBG:  Recent Labs Lab 05/30/16 2131 05/31/16 0812 05/31/16 1220 05/31/16 1709 05/31/16 2112  GLUCAP 166* 146* 244* 229* 149*   Time spent: 35 minutes  Signed:  Laszlo Ellerby  [facility] 06/01/2016   , 11:03 PM

## 2016-06-03 NOTE — Progress Notes (Signed)
QI encounter 

## 2016-06-13 DIAGNOSIS — N183 Chronic kidney disease, stage 3 unspecified: Secondary | ICD-10-CM | POA: Insufficient documentation

## 2016-06-13 DIAGNOSIS — N189 Chronic kidney disease, unspecified: Secondary | ICD-10-CM | POA: Insufficient documentation

## 2016-06-13 DIAGNOSIS — E1122 Type 2 diabetes mellitus with diabetic chronic kidney disease: Secondary | ICD-10-CM | POA: Insufficient documentation

## 2016-06-13 DIAGNOSIS — R5381 Other malaise: Secondary | ICD-10-CM | POA: Insufficient documentation

## 2016-06-13 HISTORY — DX: Other malaise: R53.81

## 2016-06-13 HISTORY — DX: Chronic kidney disease, stage 3 unspecified: N18.30

## 2016-06-16 ENCOUNTER — Other Ambulatory Visit: Payer: Self-pay | Admitting: *Deleted

## 2016-06-16 ENCOUNTER — Other Ambulatory Visit: Payer: Self-pay

## 2016-06-16 DIAGNOSIS — C9000 Multiple myeloma not having achieved remission: Secondary | ICD-10-CM

## 2016-06-17 ENCOUNTER — Telehealth: Payer: Self-pay | Admitting: Hematology

## 2016-06-17 NOTE — Telephone Encounter (Signed)
Spoke to wife - unable to come in in a week - scheduled appt for 6/25 - patient aware of date and time.

## 2016-06-23 ENCOUNTER — Ambulatory Visit: Payer: Medicare HMO | Admitting: Hematology

## 2016-06-23 ENCOUNTER — Other Ambulatory Visit: Payer: Medicare HMO

## 2016-06-30 ENCOUNTER — Ambulatory Visit (HOSPITAL_BASED_OUTPATIENT_CLINIC_OR_DEPARTMENT_OTHER): Payer: Medicare HMO | Admitting: Hematology

## 2016-06-30 ENCOUNTER — Telehealth: Payer: Self-pay | Admitting: Hematology

## 2016-06-30 ENCOUNTER — Other Ambulatory Visit (HOSPITAL_BASED_OUTPATIENT_CLINIC_OR_DEPARTMENT_OTHER): Payer: Medicare HMO

## 2016-06-30 ENCOUNTER — Encounter: Payer: Self-pay | Admitting: Hematology

## 2016-06-30 VITALS — BP 144/74 | HR 81 | Temp 97.7°F | Resp 17 | Ht 71.0 in | Wt 140.7 lb

## 2016-06-30 DIAGNOSIS — A0472 Enterocolitis due to Clostridium difficile, not specified as recurrent: Secondary | ICD-10-CM

## 2016-06-30 DIAGNOSIS — C9000 Multiple myeloma not having achieved remission: Secondary | ICD-10-CM | POA: Diagnosis not present

## 2016-06-30 DIAGNOSIS — N179 Acute kidney failure, unspecified: Secondary | ICD-10-CM | POA: Diagnosis not present

## 2016-06-30 DIAGNOSIS — E119 Type 2 diabetes mellitus without complications: Secondary | ICD-10-CM | POA: Diagnosis not present

## 2016-06-30 DIAGNOSIS — E876 Hypokalemia: Secondary | ICD-10-CM

## 2016-06-30 DIAGNOSIS — D472 Monoclonal gammopathy: Secondary | ICD-10-CM

## 2016-06-30 DIAGNOSIS — D649 Anemia, unspecified: Secondary | ICD-10-CM | POA: Diagnosis not present

## 2016-06-30 DIAGNOSIS — K089 Disorder of teeth and supporting structures, unspecified: Secondary | ICD-10-CM | POA: Diagnosis not present

## 2016-06-30 DIAGNOSIS — R634 Abnormal weight loss: Secondary | ICD-10-CM

## 2016-06-30 LAB — CBC & DIFF AND RETIC
BASO%: 0.2 % (ref 0.0–2.0)
Basophils Absolute: 0 10*3/uL (ref 0.0–0.1)
EOS ABS: 0 10*3/uL (ref 0.0–0.5)
EOS%: 0.2 % (ref 0.0–7.0)
HCT: 31.6 % — ABNORMAL LOW (ref 38.4–49.9)
HGB: 10.4 g/dL — ABNORMAL LOW (ref 13.0–17.1)
Immature Retic Fract: 2.6 % — ABNORMAL LOW (ref 3.00–10.60)
LYMPH#: 3.1 10*3/uL (ref 0.9–3.3)
LYMPH%: 48.8 % (ref 14.0–49.0)
MCH: 29.4 pg (ref 27.2–33.4)
MCHC: 32.9 g/dL (ref 32.0–36.0)
MCV: 89.3 fL (ref 79.3–98.0)
MONO#: 0.6 10*3/uL (ref 0.1–0.9)
MONO%: 9.2 % (ref 0.0–14.0)
NEUT%: 41.6 % (ref 39.0–75.0)
NEUTROS ABS: 2.6 10*3/uL (ref 1.5–6.5)
Platelets: 227 10*3/uL (ref 140–400)
RBC: 3.54 10*6/uL — AB (ref 4.20–5.82)
RDW: 17.9 % — ABNORMAL HIGH (ref 11.0–14.6)
RETIC %: 1.5 % (ref 0.80–1.80)
RETIC CT ABS: 53.1 10*3/uL (ref 34.80–93.90)
WBC: 6.3 10*3/uL (ref 4.0–10.3)
nRBC: 0 % (ref 0–0)

## 2016-06-30 LAB — COMPREHENSIVE METABOLIC PANEL
ALT: 13 U/L (ref 0–55)
AST: 17 U/L (ref 5–34)
Albumin: 3.1 g/dL — ABNORMAL LOW (ref 3.5–5.0)
Alkaline Phosphatase: 124 U/L (ref 40–150)
Anion Gap: 10 mEq/L (ref 3–11)
BUN: 7.6 mg/dL (ref 7.0–26.0)
CHLORIDE: 108 meq/L (ref 98–109)
CO2: 25 meq/L (ref 22–29)
CREATININE: 1.1 mg/dL (ref 0.7–1.3)
Calcium: 8.9 mg/dL (ref 8.4–10.4)
EGFR: 80 mL/min/{1.73_m2} — ABNORMAL LOW (ref >90)
GLUCOSE: 133 mg/dL (ref 70–140)
Potassium: 3 mEq/L — CL (ref 3.5–5.1)
Sodium: 142 mEq/L (ref 136–145)
Total Bilirubin: 0.25 mg/dL (ref 0.20–1.20)
Total Protein: 6.7 g/dL (ref 6.4–8.3)

## 2016-06-30 NOTE — Telephone Encounter (Signed)
Appointments scheduled per 06/30/16 los. °Patient was given a copy of the AVS report and appointment schedule, per 06/30/16 los. °

## 2016-06-30 NOTE — Patient Instructions (Signed)
Thank you for choosing [facility] to provide your oncology and hematology care.  To afford each patient quality time with our providers, please arrive 30 minutes before your scheduled appointment time.  If you arrive late for your appointment, you may be asked to reschedule.  We strive to give you quality time with our providers, and arriving late affects you and other patients whose appointments are after yours.  If you are a no show for multiple scheduled visits, you may be dismissed from the clinic at the providers discretion.   Again, thank you for choosing [facility], our hope is that these requests will decrease the amount of time that you wait before being seen by our physicians.  ______________________________________________________________________ Should you have questions after your visit to the  Cancer Center, please contact our office at [phone] between the hours of 8:30 and 4:30 p.m.    Voicemails left after 4:30p.m will not be returned until the following business day.   For prescription refill requests, please have your pharmacy contact us directly.  Please also try to allow 48 hours for prescription requests.   Please contact the scheduling department for questions regarding scheduling.  For scheduling of procedures such as PET scans, CT scans, MRI, Ultrasound, etc please contact central scheduling at [phone].   Resources For Cancer Patients and Caregivers:  American Cancer Society:  800-227-2345  Can help patients locate various types of support and financial assistance Cancer Care: 1-800-813-HOPE (4673) Provides financial assistance, online support groups, medication/co-pay assistance.   [place] DSS:  336-641-3447 Where to apply for food stamps, Medicaid, and utility assistance Medicare Rights Center: 800-333-4114 Helps people with Medicare understand their rights and benefits, navigate the Medicare system, and secure the  quality healthcare they deserve SCAT: 336-333-6589 [place] [employer]'s shared-ride transportation service for eligible riders who have a disability that prevents them from riding the fixed route bus.   For additional information on assistance programs please contact our social worker:   Grier Hock/Abigail Elmore:  [phone] 

## 2016-07-02 LAB — MULTIPLE MYELOMA PANEL, SERUM
ALBUMIN SERPL ELPH-MCNC: 3.2 g/dL (ref 2.9–4.4)
ALPHA 1: 0.2 g/dL (ref 0.0–0.4)
ALPHA2 GLOB SERPL ELPH-MCNC: 0.8 g/dL (ref 0.4–1.0)
Albumin/Glob SerPl: 1 (ref 0.7–1.7)
B-GLOBULIN SERPL ELPH-MCNC: 0.8 g/dL (ref 0.7–1.3)
Gamma Glob SerPl Elph-Mcnc: 1.4 g/dL (ref 0.4–1.8)
Globulin, Total: 3.3 g/dL (ref 2.2–3.9)
IgA, Qn, Serum: 145 mg/dL (ref 61–437)
IgM, Qn, Serum: 53 mg/dL (ref 15–143)
M PROTEIN SERPL ELPH-MCNC: 0.3 g/dL — AB
TOTAL PROTEIN: 6.5 g/dL (ref 6.0–8.5)

## 2016-07-02 LAB — KAPPA/LAMBDA LIGHT CHAINS
Ig Kappa Free Light Chain: 26.3 mg/L — ABNORMAL HIGH (ref 3.3–19.4)
Ig Lambda Free Light Chain: 16.7 mg/L (ref 5.7–26.3)
KAPPA/LAMBDA FLC RATIO: 1.57 (ref 0.26–1.65)

## 2016-07-08 ENCOUNTER — Other Ambulatory Visit: Payer: Self-pay | Admitting: Hematology

## 2016-07-09 NOTE — Progress Notes (Signed)
[facility]  HEMATOLOGY ONCOLOGY PROGRESS NOTE  Date of service: .06/30/2016  Patient Care Team: [name], MD as PCP - General (Family Medicine)  CC: f/u for multiple myeloma  Diagnosis:  IgG Kappa multiple myeloma   Current Treatment:  CyBorD  INTERVAL HISTORY: Patient and his wife are here for follow-up for consideration of continued treatment of the patients multiple myeloma.  He had a prolonged hospitalization from 5/6 to 5/27 for severe clostridium difficile enteritis with sepsis. Also had AKI in the setting of sepsis and IV contrast for CT scan. His c diff has resolved and his renal function appears to be back to pre-hospitalization baseline. He has lost about 25lbs and is starting to gain back some of his weight and is eating better.  He would like to address his dental issues prior to any further myeloma specific treatment. Continue to be on probiotics.   . Wt Readings from Last 3 Encounters:  06/30/16 140 lb 11.2 oz (63.8 kg)  05/26/16 177 lb 4 oz (80.4 kg)  04/29/16 167 lb 12.8 oz (76.1 kg)    REVIEW OF SYSTEMS:    10 Point review of systems of done and is negative except as noted above.  . Past Medical History:  Diagnosis Date  . CAD (coronary artery disease) 2006   stent LAD 3.0x18 [place]her, occluded RCA  . Cancer ([place])    multiple myeloma  . Diabetes mellitus without complication ([place])   . H/O non-insulin dependent diabetes mellitus   . Hyperlipidemia   . Hypertension     . Past Surgical History:  Procedure Laterality Date  . CARDIAC CATHETERIZATION  2006  . IR FLUORO GUIDE CV LINE RIGHT  05/20/2016  . IR US GUIDE VASC ACCESS RIGHT  05/20/2016    . Social History  Substance Use Topics  . Smoking status: Never Smoker  . Smokeless tobacco: Never Used  . Alcohol use No    ALLERGIES:  has No Known Allergies.  MEDICATIONS:  Current Outpatient Prescriptions  Medication Sig Dispense Refill  . amLODipine (NORVASC) 10 MG tablet Take 10 mg by mouth  daily.    . feeding supplement, GLUCERNA SHAKE, (GLUCERNA SHAKE) LIQD Take 237 mLs by mouth 3 (three) times daily as needed.    . metoprolol tartrate (LOPRESSOR) 100 MG tablet Take 100 mg by mouth once.    [facility] Rose acyclovir (ZOVIRAX) 200 MG capsule TAKE 1 CAPSULE BY MOUTH TWICE DAILY 30 capsule 3  . Amino Acids-Protein Hydrolys (FEEDING SUPPLEMENT, PRO-STAT SUGAR FREE 64,) LIQD Take 30 mLs by mouth 2 (two) times daily. 900 mL 0  . aspirin 81 MG tablet Take 81 mg by mouth daily.    Douglas Rose atorvastatin (LIPITOR) 20 MG tablet Take 1 tablet (20 mg total) by mouth daily. 90 tablet 3  . chlorhexidine (PERIDEX) 0.12 % solution Use as directed 15 mLs in the mouth or throat 2 (two) times daily. 473 mL 0  . colestipol (COLESTID) 1 g tablet Take 1 tablet (1 g total) by mouth 2 (two) times daily. 24 tablet 0  . insulin aspart (NOVOLOG) 100 UNIT/ML FlexPen Three times a day before meals per sliding scale. between 2-6 on average    . Insulin Glargine (LANTUS SOLOSTAR) 100 UNIT/ML Solostar Pen Inject 20 Units into the skin daily. 15 mL 11  . nitroGLYCERIN (NITROSTAT) 0.4 MG SL tablet Place 1 tablet (0.4 mg total) under the tongue every 5 (five) minutes x 3 doses as needed for chest pain. 30 tablet 12  . ondansetron (  ZOFRAN) 8 MG tablet Take 1 tablet (8 mg total) by mouth 2 (two) times daily as needed for refractory nausea / vomiting. 30 tablet 1  . potassium chloride 20 MEQ/15ML (10%) SOLN Take 30 mLs (40 mEq total) by mouth 3 (three) times daily. 450 mL 0  . prochlorperazine (COMPAZINE) 10 MG tablet Take 1 tablet (10 mg total) by mouth every 6 (six) hours as needed (Nausea or vomiting). 30 tablet 1  . saccharomyces boulardii (FLORASTOR) 250 MG capsule Take 1 capsule (250 mg total) by mouth 2 (two) times daily. 60 capsule 0  . tamsulosin (FLOMAX) 0.4 MG CAPS capsule Take 1 capsule (0.4 mg total) by mouth daily after supper. 30 capsule 2   No current facility-administered medications for this visit.     PHYSICAL  EXAMINATION: ECOG PERFORMANCE STATUS: 1 - Symptomatic but completely ambulatory  . Vitals:   06/30/16 1420  BP: (!) 144/74  Pulse: 81  Resp: 17  Temp: 97.7 F (36.5 C)    Filed Weights   06/30/16 1420  Weight: 140 lb 11.2 oz (63.8 kg)   .Body mass index is 19.62 kg/m.  GENERAL:alert, in no acute distress and comfortable SKIN: no acute rashes, no significant lesions EYES: conjunctiva are pink and non-injected, sclera anicteric OROPHARYNX: MMM, no exudates, no oropharyngeal erythema or ulceration NECK: supple, no JVD LYMPH:  no palpable lymphadenopathy in the cervical, axillary or inguinal regions LUNGS: clear to auscultation b/l with normal respiratory effort HEART: regular rate & rhythm ABDOMEN:  normoactive bowel sounds , non tender, not distended. Extremity: no pedal edema PSYCH: alert & oriented x 3 with fluent speech NEURO: no focal motor/sensory deficits  LABORATORY DATA:   I have reviewed the data as listed  . CBC Latest Ref Rng & Units 06/30/2016 05/30/2016 05/28/2016  WBC 4.0 - 10.3 10e3/uL 6.3 3.0(L) 4.1  Hemoglobin 13.0 - 17.1 g/dL 10.4(L) 9.3(L) 9.2(L)  Hematocrit 38.4 - 49.9 % 31.6(L) 27.6(L) 27.2(L)  Platelets 140 - 400 10e3/uL 227 161 140(L)    . CMP Latest Ref Rng & Units 06/30/2016 06/30/2016 06/01/2016  Glucose 70 - 140 mg/dl 133 - 115(H)  BUN 7.0 - 26.0 mg/dL 7.6 - 25(H)  Creatinine 0.7 - 1.3 mg/dL 1.1 - 1.60(H)  Sodium 136 - 145 mEq/L 142 - 143  Potassium 3.5 - 5.1 mEq/L 3.0(LL) - 3.4(L)  Chloride 101 - 111 mmol/L - - 121(H)  CO2 22 - 29 mEq/L 25 - 18(L)  Calcium 8.4 - 10.4 mg/dL 8.9 - 7.6(L)  Total Protein 6.0 - 8.5 g/dL 6.7 6.5 -  Total Bilirubin 0.20 - 1.20 mg/dL 0.25 - -  Alkaline Phos 40 - 150 U/L 124 - -  AST 5 - 34 U/L 17 - -  ALT 0 - 55 U/L 13 - -     RADIOGRAPHIC STUDIES: I have personally reviewed the radiological images as listed and agreed with the findings in the report. No results found.  ASSESSMENT & PLAN:   71 yo with  multiple medical comorbidities but fairly good performance status overall with   1) ISS Stage II IgG Kapppa multiple myeloma. Anemia + Renal insufficiency. Bone survey neg for concerning bone lesions. significantly elevated kappafree light chains 858 mg percent, lambda 18.4, with a ratio of 46.65 . He was also noted to have an M spike of 1.7 g/dL with IFE showing IgG kappa monoclonal protein. Quantitative IgG level was increased to nearly 2500 mg/dL. 24-hour UPEP showed monoclonal protein of 1300 mg per 24 hours which constituted  about 86% of all the urinary protein. Bone marrow biopsy showed 20-30% Restricted plasma cells consistent with multiple myeloma Cytogenetics/FISH  Showed   Patient has completed 2 cycles of CyBorD with excellent response to treatment. Unfortunate has had delay in further treatment due to severe clostridium difficile colitis. 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 Had an element of obstructive uropathy due to BPH which has resolved. Creatinine continues to improve from 3.5 to 2.9 to 1.9 to 1.6 Had AKI while hospitalized from sepsis, dehydration , IV contrast -- now resolved. Creatinine down to 1.1 3) Normocytic anemia likely related to multiple myeloma.-stable 4) Severe Clostridium difficile colitis - resolved. Continues to be on probiotics. ? Triggered b Clindamycin for dental periodontal abscess. 5) significant weight loss from sepsis and c diff colitis. Improving. . Wt Readings from Last 3 Encounters:  06/30/16 140 lb 11.2 oz (63.8 kg)  05/26/16 177 lb 4 oz (80.4 kg)  04/29/16 167 lb 12.8 oz (76.1 kg)   5) Poor dentition with previous right mandibular dental abscess . Patient is completed course of clindamycin and noted that his tooth pain has resolved . Has not seen dentist despite multiple reminders 58  Hypokalemia - on potassium replacement ?urinary and GI losses. Plan  -continue Potassium replacement as per PCP. -patient recommended to focus on Po intake and work on improving his functional and nutritional status. -f/u with dentist ASAP to address dental issues -will hold off on myeloma treatment for another 3-4 weeks- especially labs show disease is controlled. -will likely restart on Vd only -monitor for concerns for c diff recurence -continue probiotics.  5) DM2 -  -mx per PCP  RTC with Dr Irene Limbo with labs in 3-4 weeks   I spent 30 minutes counseling the patient face to face. The total time spent in the appointment was 40 minutes and more than 50% was on counseling and direct patient cares.    Sullivan Lone MD [place] [facility]AHIVMS [facility] Hematology/Oncology Physician [facility]  (Office):       [phone] (Work cell):  [phone] (Fax):           [phone]

## 2016-07-10 ENCOUNTER — Telehealth: Payer: Self-pay | Admitting: *Deleted

## 2016-07-10 DIAGNOSIS — C9000 Multiple myeloma not having achieved remission: Secondary | ICD-10-CM

## 2016-07-10 NOTE — Telephone Encounter (Signed)
-----   Message from Brunetta Genera, MD sent at 07/09/2016 10:04 PM EDT ----- Plz let patient know his myeloma labs are stable. So we will plan to hold further myeloma rx for atleast 3-4 weeks. He needs to f/u with dentist ASAP as per discussion. Continue Potassium replacement per PCP and rpt potassium labs in 10-14 days with PCP. Thanks Heeney

## 2016-07-10 NOTE — Telephone Encounter (Signed)
LVM with Mr. Macleod to discuss labs/recommedations per [name].  Call back number provided.

## 2016-07-11 MED ORDER — ACYCLOVIR 200 MG PO CAPS: 200.0000 mg | ORAL_CAPSULE | Freq: Two times a day (BID) | ORAL | 3 refills | Status: DC

## 2016-07-11 NOTE — Telephone Encounter (Signed)
SW pt's wife regarding recommendations per [name].  No myeloma tx for 3-4 weeks.  Pt has f/u with dentist next week.  Pt continuing potassium replacement as directed by PCP, stated pt was at PCP yesterday for lab work.  Pt requested refill on acyclovir.  Previous staff message sent to [name] to verify ok for refill ([place]  Pt completely out, this RN verified with Dr. Alvy Bimler ok for refill.  Ok for refill per Dr. Alvy Bimler.

## 2016-07-15 ENCOUNTER — Telehealth: Payer: Self-pay

## 2016-07-15 NOTE — Telephone Encounter (Signed)
Per Dr. Lebron Conners, pt okay to continue taking acyclovir despite hold on myeloma tx for 3-4 weeks per [name]. Referred to PCP regarding florastor prescription.

## 2016-07-22 DIAGNOSIS — G629 Polyneuropathy, unspecified: Secondary | ICD-10-CM

## 2016-07-22 DIAGNOSIS — E1149 Type 2 diabetes mellitus with other diabetic neurological complication: Secondary | ICD-10-CM

## 2016-07-22 DIAGNOSIS — C9001 Multiple myeloma in remission: Secondary | ICD-10-CM

## 2016-07-22 HISTORY — DX: Polyneuropathy, unspecified: G62.9

## 2016-07-22 HISTORY — DX: Multiple myeloma in remission: C90.01

## 2016-07-22 HISTORY — DX: Type 2 diabetes mellitus with other diabetic neurological complication: E11.49

## 2016-08-05 ENCOUNTER — Ambulatory Visit (HOSPITAL_BASED_OUTPATIENT_CLINIC_OR_DEPARTMENT_OTHER): Payer: Medicare HMO | Admitting: Hematology

## 2016-08-05 ENCOUNTER — Other Ambulatory Visit (HOSPITAL_BASED_OUTPATIENT_CLINIC_OR_DEPARTMENT_OTHER): Payer: Medicare HMO

## 2016-08-05 ENCOUNTER — Telehealth: Payer: Self-pay | Admitting: Hematology

## 2016-08-05 VITALS — BP 138/70 | HR 98 | Temp 98.4°F | Resp 18 | Ht 71.0 in | Wt 145.7 lb

## 2016-08-05 DIAGNOSIS — N08 Glomerular disorders in diseases classified elsewhere: Secondary | ICD-10-CM

## 2016-08-05 DIAGNOSIS — N2889 Other specified disorders of kidney and ureter: Secondary | ICD-10-CM | POA: Diagnosis not present

## 2016-08-05 DIAGNOSIS — D649 Anemia, unspecified: Secondary | ICD-10-CM

## 2016-08-05 DIAGNOSIS — E119 Type 2 diabetes mellitus without complications: Secondary | ICD-10-CM

## 2016-08-05 DIAGNOSIS — C9000 Multiple myeloma not having achieved remission: Secondary | ICD-10-CM

## 2016-08-05 LAB — COMPREHENSIVE METABOLIC PANEL
ALT: 19 U/L (ref 0–55)
AST: 19 U/L (ref 5–34)
Albumin: 3.5 g/dL (ref 3.5–5.0)
Alkaline Phosphatase: 64 U/L (ref 40–150)
Anion Gap: 8 mEq/L (ref 3–11)
BUN: 19.3 mg/dL (ref 7.0–26.0)
CHLORIDE: 107 meq/L (ref 98–109)
CO2: 26 meq/L (ref 22–29)
Calcium: 9.4 mg/dL (ref 8.4–10.4)
Creatinine: 1.5 mg/dL — ABNORMAL HIGH (ref 0.7–1.3)
EGFR: 53 mL/min/{1.73_m2} — AB (ref >90)
GLUCOSE: 134 mg/dL (ref 70–140)
Potassium: 4.5 mEq/L (ref 3.5–5.1)
SODIUM: 140 meq/L (ref 136–145)
TOTAL PROTEIN: 7 g/dL (ref 6.4–8.3)
Total Bilirubin: 0.33 mg/dL (ref 0.20–1.20)

## 2016-08-05 LAB — CBC & DIFF AND RETIC
BASO%: 0.2 % (ref 0.0–2.0)
Basophils Absolute: 0 10*3/uL (ref 0.0–0.1)
EOS%: 2.9 % (ref 0.0–7.0)
Eosinophils Absolute: 0.2 10*3/uL (ref 0.0–0.5)
HCT: 32.7 % — ABNORMAL LOW (ref 38.4–49.9)
HGB: 10.5 g/dL — ABNORMAL LOW (ref 13.0–17.1)
IMMATURE RETIC FRACT: 3 % (ref 3.00–10.60)
LYMPH#: 2.6 10*3/uL (ref 0.9–3.3)
LYMPH%: 51.4 % — ABNORMAL HIGH (ref 14.0–49.0)
MCH: 29.2 pg (ref 27.2–33.4)
MCHC: 32.1 g/dL (ref 32.0–36.0)
MCV: 91.1 fL (ref 79.3–98.0)
MONO#: 0.6 10*3/uL (ref 0.1–0.9)
MONO%: 10.7 % (ref 0.0–14.0)
NEUT%: 34.8 % — ABNORMAL LOW (ref 39.0–75.0)
NEUTROS ABS: 1.8 10*3/uL (ref 1.5–6.5)
Platelets: 221 10*3/uL (ref 140–400)
RBC: 3.59 10*6/uL — ABNORMAL LOW (ref 4.20–5.82)
RDW: 14.8 % — AB (ref 11.0–14.6)
RETIC CT ABS: 40.93 10*3/uL (ref 34.80–93.90)
Retic %: 1.14 % (ref 0.80–1.80)
WBC: 5.1 10*3/uL (ref 4.0–10.3)

## 2016-08-05 NOTE — Telephone Encounter (Signed)
Gave patient avs report and appointments for August.  °

## 2016-08-06 LAB — KAPPA/LAMBDA LIGHT CHAINS
IG KAPPA FREE LIGHT CHAIN: 31.7 mg/L — AB (ref 3.3–19.4)
IG LAMBDA FREE LIGHT CHAIN: 18.9 mg/L (ref 5.7–26.3)
Kappa/Lambda FluidC Ratio: 1.68 — ABNORMAL HIGH (ref 0.26–1.65)

## 2016-08-08 LAB — MULTIPLE MYELOMA PANEL, SERUM
ALBUMIN SERPL ELPH-MCNC: 3.5 g/dL (ref 2.9–4.4)
ALPHA 1: 0.2 g/dL (ref 0.0–0.4)
Albumin/Glob SerPl: 1.2 (ref 0.7–1.7)
Alpha2 Glob SerPl Elph-Mcnc: 0.8 g/dL (ref 0.4–1.0)
B-GLOBULIN SERPL ELPH-MCNC: 0.8 g/dL (ref 0.7–1.3)
GAMMA GLOB SERPL ELPH-MCNC: 1.4 g/dL (ref 0.4–1.8)
GLOBULIN, TOTAL: 3.1 g/dL (ref 2.2–3.9)
IgA, Qn, Serum: 137 mg/dL (ref 61–437)
IgG, Qn, Serum: 1485 mg/dL (ref 700–1600)
IgM, Qn, Serum: 53 mg/dL (ref 15–143)
Total Protein: 6.6 g/dL (ref 6.0–8.5)

## 2016-08-13 NOTE — Progress Notes (Signed)
[name]  HEMATOLOGY ONCOLOGY PROGRESS NOTE  Date of service: .08/05/2016  Patient Care Team: [name], MD as PCP - General (Family Medicine)  CC: f/u for multiple myeloma  Diagnosis:  IgG Kappa multiple myeloma   Current Treatment:  CyBorD  INTERVAL HISTORY:  Patient and his wife are here for follow-up for consideration of continued treatment of the patients multiple myeloma.  He notes that he is eating well and has gained about 5 pounds since his last clinic visit. He still has some dental pain and has not had a dental appointment yet but does have one on 08/11/2016. He is not keen to pursue any additional treatment at this time to his dental issues are taken care of completely. He understands the risk of progression in the interim. He would like to give it another month to recover more completely and address his dental issues prior to any further consideration of myeloma treatment. Continue to be on probiotics.  No abdominal pain diarrhea or nausea or vomiting.  . Wt Readings from Last 3 Encounters:  08/05/16 145 lb 11.2 oz (66.1 kg)  06/30/16 140 lb 11.2 oz (63.8 kg)  05/26/16 177 lb 4 oz (80.4 kg)    REVIEW OF SYSTEMS:    10 Point review of systems of done and is negative except as noted above.  . Past Medical History:  Diagnosis Date  . CAD (coronary artery disease) 2006   stent LAD 3.0x18 [place], occluded RCA  . Cancer ([place])    multiple myeloma  . Diabetes mellitus without complication ([place])   . H/O non-insulin dependent diabetes mellitus   . Hyperlipidemia   . Hypertension     . Past Surgical History:  Procedure Laterality Date  . CARDIAC CATHETERIZATION  2006  . IR FLUORO GUIDE CV LINE RIGHT  05/20/2016  . IR US GUIDE VASC ACCESS RIGHT  05/20/2016    . Social History  Substance Use Topics  . Smoking status: Never Smoker  . Smokeless tobacco: Never Used  . Alcohol use No    ALLERGIES:  has No Known Allergies.  MEDICATIONS:  Current Outpatient  Prescriptions  Medication Sig Dispense Refill  . acyclovir (ZOVIRAX) 200 MG capsule Take 1 capsule (200 mg total) by mouth 2 (two) times daily. 30 capsule 3  . Amino Acids-Protein Hydrolys (FEEDING SUPPLEMENT, PRO-STAT SUGAR FREE 64,) LIQD Take 30 mLs by mouth 2 (two) times daily. 900 mL 0  . amLODipine (NORVASC) 10 MG tablet Take 10 mg by mouth daily.    [name] aspirin 81 MG tablet Take 81 mg by mouth daily.    [name] atorvastatin (LIPITOR) 20 MG tablet Take 1 tablet (20 mg total) by mouth daily. 90 tablet 3  . chlorhexidine (PERIDEX) 0.12 % solution Use as directed 15 mLs in the mouth or throat 2 (two) times daily. 473 mL 0  . colestipol (COLESTID) 1 g tablet Take 1 tablet (1 g total) by mouth 2 (two) times daily. 24 tablet 0  . feeding supplement, GLUCERNA SHAKE, (GLUCERNA SHAKE) LIQD Take 237 mLs by mouth 3 (three) times daily as needed.    . insulin aspart (NOVOLOG) 100 UNIT/ML FlexPen Three times a day before meals per sliding scale. between 2-6 on average    . Insulin Glargine (LANTUS SOLOSTAR) 100 UNIT/ML Solostar Pen Inject 20 Units into the skin daily. 15 mL 11  . metoprolol tartrate (LOPRESSOR) 100 MG tablet Take 100 mg by mouth once.    . nitroGLYCERIN (NITROSTAT) 0.4 MG SL tablet Place  1 tablet (0.4 mg total) under the tongue every 5 (five) minutes x 3 doses as needed for chest pain. 30 tablet 12  . ondansetron (ZOFRAN) 8 MG tablet Take 1 tablet (8 mg total) by mouth 2 (two) times daily as needed for refractory nausea / vomiting. 30 tablet 1  . potassium chloride 20 MEQ/15ML (10%) SOLN Take 30 mLs (40 mEq total) by mouth 3 (three) times daily. 450 mL 0  . prochlorperazine (COMPAZINE) 10 MG tablet Take 1 tablet (10 mg total) by mouth every 6 (six) hours as needed (Nausea or vomiting). 30 tablet 1  . saccharomyces boulardii (FLORASTOR) 250 MG capsule Take 1 capsule (250 mg total) by mouth 2 (two) times daily. 60 capsule 0  . tamsulosin (FLOMAX) 0.4 MG CAPS capsule Take 1 capsule (0.4 mg total) by  mouth daily after supper. 30 capsule 2   No current facility-administered medications for this visit.     PHYSICAL EXAMINATION: ECOG PERFORMANCE STATUS: 1 - Symptomatic but completely ambulatory  . Vitals:   08/05/16 1437  BP: 138/70  Pulse: 98  Resp: 18  Temp: 98.4 F (36.9 C)    Filed Weights   08/05/16 1437  Weight: 145 lb 11.2 oz (66.1 kg)   .Body mass index is 20.32 kg/m.  GENERAL:alert, in no acute distress and comfortable SKIN: no acute rashes, no significant lesions EYES: conjunctiva are pink and non-injected, sclera anicteric OROPHARYNX: MMM, no exudates, no oropharyngeal erythema or ulceration NECK: supple, no JVD LYMPH:  no palpable lymphadenopathy in the cervical, axillary or inguinal regions LUNGS: clear to auscultation b/l with normal respiratory effort HEART: regular rate & rhythm ABDOMEN:  normoactive bowel sounds , non tender, not distended. Extremity: no pedal edema PSYCH: alert & oriented x 3 with fluent speech NEURO: no focal motor/sensory deficits  LABORATORY DATA:   I have reviewed the data as listed  . CBC Latest Ref Rng & Units 08/05/2016 06/30/2016 05/30/2016  WBC 4.0 - 10.3 10e3/uL 5.1 6.3 3.0(L)  Hemoglobin 13.0 - 17.1 g/dL 10.5(L) 10.4(L) 9.3(L)  Hematocrit 38.4 - 49.9 % 32.7(L) 31.6(L) 27.6(L)  Platelets 140 - 400 10e3/uL 221 227 161    . CMP Latest Ref Rng & Units 08/05/2016 08/05/2016 06/30/2016  Glucose 70 - 140 mg/dl 134 - 133  BUN 7.0 - 26.0 mg/dL 19.3 - 7.6  Creatinine 0.7 - 1.3 mg/dL 1.5(H) - 1.1  Sodium 136 - 145 mEq/L 140 - 142  Potassium 3.5 - 5.1 mEq/L 4.5 - 3.0(LL)  Chloride 101 - 111 mmol/L - - -  CO2 22 - 29 mEq/L 26 - 25  Calcium 8.4 - 10.4 mg/dL 9.4 - 8.9  Total Protein 6.0 - 8.5 g/dL 7.0 6.6 6.7  Total Bilirubin 0.20 - 1.20 mg/dL 0.33 - 0.25  Alkaline Phos 40 - 150 U/L 64 - 124  AST 5 - 34 U/L 19 - 17  ALT 0 - 55 U/L 19 - 13          RADIOGRAPHIC STUDIES: I have personally reviewed the radiological  images as listed and agreed with the findings in the report. No results found.  ASSESSMENT & PLAN:   71 yo with multiple medical comorbidities but fairly good performance status overall with   1) ISS Stage II IgG Kapppa multiple myeloma. Anemia + Renal insufficiency. Bone survey neg for concerning bone lesions. significantly elevated kappafree light chains 858 mg percent, lambda 18.4, with a ratio of 46.65 . He was also noted to have an M spike of 1.7  g/dL with IFE showing IgG kappa monoclonal protein. Quantitative IgG level was increased to nearly 2500 mg/dL. 24-hour UPEP showed monoclonal protein of 1300 mg per 24 hours which constituted about 86% of all the urinary protein. Bone marrow biopsy showed 20-30% Restricted plasma cells consistent with multiple myeloma Cytogenetics/FISH  Showed   Patient has completed 2 cycles of CyBorD with excellent response to treatment. Unfortunate has had delay in further treatment due to severe clostridium difficile colitis. (Now resolved)  Labs from today show his myeloma response to treatment has held thus far Repeat labs from today show his M spike and IFE have resolved. Serum kappa light chains and kappa lambda ratio are gradually increasing.        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 Had an element of obstructive uropathy due to BPH which has resolved. Creatinine continues to improve from 3.5 to 2.9 to 1.9 to 1.6 to 1.5 Had AKI while hospitalized from sepsis, dehydration , IV contrast -- now resolved. Creatinine down to 1.1 3) Normocytic anemia likely related to multiple myeloma.-stable 4) Severe Clostridium difficile colitis - resolved. Continues to be on probiotics. ? Triggered b Clindamycin for dental periodontal abscess. 5) significant weight loss from sepsis and c diff colitis. Improving. . Wt Readings from Last 3 Encounters:  08/05/16 145 lb 11.2 oz (66.1 kg)  06/30/16 140 lb 11.2  oz (63.8 kg)  05/26/16 177 lb 4 oz (80.4 kg)   5) Poor dentition with previous right mandibular dental abscess . Patient is completed course of clindamycin and noted that his tooth pain has resolved . Has not seen dentist despite multiple reminders 58 Hypokalemia - on potassium replacement ?urinary and GI losses. Plan  -We will hold off on continuing myeloma treatment at this time based on patient's preference. -He has a dental appointment on 08/11/2016 which she wants to pursue to address his dental issues and previous concerns of mandibular abscess. -will hold off on myeloma treatment for another 3-4 weeks- per patients request.. -will likely restart on Vd only -monitor for concerns for c diff recurence -continue probiotics.  5) DM2 -  -mx per PCP  RTC with Dr Irene Limbo with labs in 4 weeks with labs.  I spent 20 minutes counseling the patient face to face. The total time spent in the appointment was 25 minutes and more than 50% was on counseling and direct patient cares.    Sullivan Lone MD [place] [facility] Hematology/Oncology Physician [facility]  (Office):       [phone] (Work cell):  [phone] (Fax):           [phone]

## 2016-08-21 ENCOUNTER — Encounter: Payer: Self-pay | Admitting: Hematology

## 2016-08-21 NOTE — Progress Notes (Signed)
Patient and spouse walked in to inquire about letter received from [employer] regarding claims. Advised patient there are no claims to submit on this end but he may be able to use for insurance premiums. Advised to contact [employer] for follow up to see what may be eligible to submit. Patient has my card for any additional financial questions or concerns.

## 2016-09-01 DIAGNOSIS — I739 Peripheral vascular disease, unspecified: Secondary | ICD-10-CM

## 2016-09-01 HISTORY — DX: Peripheral vascular disease, unspecified: I73.9

## 2016-09-02 ENCOUNTER — Telehealth: Payer: Self-pay | Admitting: Hematology

## 2016-09-02 ENCOUNTER — Encounter: Payer: Self-pay | Admitting: Hematology

## 2016-09-02 ENCOUNTER — Other Ambulatory Visit (HOSPITAL_BASED_OUTPATIENT_CLINIC_OR_DEPARTMENT_OTHER): Payer: Medicare HMO

## 2016-09-02 ENCOUNTER — Ambulatory Visit (HOSPITAL_BASED_OUTPATIENT_CLINIC_OR_DEPARTMENT_OTHER): Payer: Medicare HMO | Admitting: Hematology

## 2016-09-02 VITALS — BP 146/78 | HR 70 | Temp 98.1°F | Resp 18 | Ht 71.0 in | Wt 147.5 lb

## 2016-09-02 DIAGNOSIS — C9000 Multiple myeloma not having achieved remission: Secondary | ICD-10-CM | POA: Diagnosis not present

## 2016-09-02 DIAGNOSIS — N2889 Other specified disorders of kidney and ureter: Secondary | ICD-10-CM | POA: Diagnosis not present

## 2016-09-02 DIAGNOSIS — N08 Glomerular disorders in diseases classified elsewhere: Secondary | ICD-10-CM

## 2016-09-02 DIAGNOSIS — G629 Polyneuropathy, unspecified: Secondary | ICD-10-CM | POA: Diagnosis not present

## 2016-09-02 DIAGNOSIS — D649 Anemia, unspecified: Secondary | ICD-10-CM | POA: Diagnosis not present

## 2016-09-02 DIAGNOSIS — R634 Abnormal weight loss: Secondary | ICD-10-CM | POA: Diagnosis not present

## 2016-09-02 DIAGNOSIS — E119 Type 2 diabetes mellitus without complications: Secondary | ICD-10-CM | POA: Diagnosis not present

## 2016-09-02 LAB — CBC & DIFF AND RETIC
BASO%: 0.3 % (ref 0.0–2.0)
BASOS ABS: 0 10*3/uL (ref 0.0–0.1)
EOS%: 2 % (ref 0.0–7.0)
Eosinophils Absolute: 0.1 10*3/uL (ref 0.0–0.5)
HEMATOCRIT: 32.1 % — AB (ref 38.4–49.9)
HGB: 10.2 g/dL — ABNORMAL LOW (ref 13.0–17.1)
Immature Retic Fract: 1.5 % — ABNORMAL LOW (ref 3.00–10.60)
LYMPH%: 51.3 % — ABNORMAL HIGH (ref 14.0–49.0)
MCH: 28.3 pg (ref 27.2–33.4)
MCHC: 31.8 g/dL — AB (ref 32.0–36.0)
MCV: 89.2 fL (ref 79.3–98.0)
MONO#: 0.4 10*3/uL (ref 0.1–0.9)
MONO%: 13.4 % (ref 0.0–14.0)
NEUT#: 1 10*3/uL — ABNORMAL LOW (ref 1.5–6.5)
NEUT%: 33 % — ABNORMAL LOW (ref 39.0–75.0)
Platelets: 198 10*3/uL (ref 140–400)
RBC: 3.6 10*6/uL — ABNORMAL LOW (ref 4.20–5.82)
RDW: 13.5 % (ref 11.0–14.6)
RETIC %: 0.87 % (ref 0.80–1.80)
Retic Ct Abs: 31.32 10*3/uL — ABNORMAL LOW (ref 34.80–93.90)
WBC: 3 10*3/uL — AB (ref 4.0–10.3)
lymph#: 1.5 10*3/uL (ref 0.9–3.3)

## 2016-09-02 LAB — COMPREHENSIVE METABOLIC PANEL
ALT: 14 U/L (ref 0–55)
ANION GAP: 5 meq/L (ref 3–11)
AST: 15 U/L (ref 5–34)
Albumin: 3.5 g/dL (ref 3.5–5.0)
Alkaline Phosphatase: 67 U/L (ref 40–150)
BUN: 20.7 mg/dL (ref 7.0–26.0)
CALCIUM: 9.2 mg/dL (ref 8.4–10.4)
CHLORIDE: 107 meq/L (ref 98–109)
CO2: 26 mEq/L (ref 22–29)
Creatinine: 1.5 mg/dL — ABNORMAL HIGH (ref 0.7–1.3)
EGFR: 51 mL/min/{1.73_m2} — ABNORMAL LOW (ref >90)
Glucose: 169 mg/dl — ABNORMAL HIGH (ref 70–140)
POTASSIUM: 4 meq/L (ref 3.5–5.1)
Sodium: 138 mEq/L (ref 136–145)
Total Bilirubin: 0.24 mg/dL (ref 0.20–1.20)
Total Protein: 7.1 g/dL (ref 6.4–8.3)

## 2016-09-02 NOTE — Telephone Encounter (Signed)
Advised patient he would get a call for f/u

## 2016-09-02 NOTE — Patient Instructions (Signed)
Ixazomib oral capsules What is this medicine? IXAZOMIB (ix az oh mib) is a medicine that targets proteins in cancer cells and stops the cancer cells from growing. It is used to treat multiple myeloma. This medicine may be used for other purposes; ask your health care provider or pharmacist if you have questions. COMMON BRAND NAME(S): NINLARO What should I tell my health care provider before I take this medicine? They need to know if you have any of these conditions: -kidney disease or on hemodialysis -liver disease -an unusual or allergic reaction to ixazomib, or other medicines, foods, dyes, or preservatives -pregnant or trying to get pregnant -breast-feeding How should I use this medicine? Take this medicine by mouth with a glass of water. Follow the directions on the prescription label. Take this medicine on an empty stomach, at least 1 hour before or 2 hours after food. Do not take with food. Do not take at the same time as dexamethasone. Do not cut, crush or chew this medicine. Take your medicine at regular intervals. Do not take it more often than directed. Do not stop taking except on your doctor's advice. Talk to your pediatrician regarding the use of this medicine in children. Special care may be needed. Overdosage: If you think you have taken too much of this medicine contact a poison control center or emergency room at once. NOTE: This medicine is only for you. Do not share this medicine with others. What if I miss a dose? If you miss a dose, take it as soon as you can. If your next dose is to be taken in less than 72 hours, then do not take the missed dose. Take the next dose at your regular time. If you vomit after taking your medicine, do not take another dose. Take the next dose at your regular time. Do not take double or extra doses. What may interact with this medicine? This medicine may interact with the following medications: -carbamazepine -phenytoin -rifampin -St. John's  Wort This list may not describe all possible interactions. Give your health care provider a list of all the medicines, herbs, non-prescription drugs, or dietary supplements you use. Also tell them if you smoke, drink alcohol, or use illegal drugs. Some items may interact with your medicine. What should I watch for while using this medicine? Your condition will be monitored carefully while you are receiving this medicine. Report any side effects. Continue your course of treatment even though you feel ill unless your doctor tells you to stop. This medicine may increase your risk to bruise or bleed. Call your doctor or health care professional if you notice any unusual bleeding. You may need blood work done while you are taking this medicine. Do not become pregnant while taking this medicine or for 90 days after stopping it. Women should inform their doctor if they wish to become pregnant or think they might be pregnant. Men should not father a child while taking this medicine and for 90 days after stopping it. There is a potential for serious side effects to an unborn child. Talk to your health care professional or pharmacist for more information. Do not breast-feed an infant while taking this medicine or for 90 days after stopping it. What side effects may I notice from receiving this medicine? Side effects that you should report to your doctor or health care professional as soon as possible: -allergic reactions like skin rash, itching or hives, swelling of the face, lips, or tongue -changes in vision -confusion -constipation -  diarrhea -dizziness -fast, irregular heartbeat -muscle weakness -nausea/vomiting -pain, tingling, numbness in the hands or feet -redness, blistering, peeling or loosening of the skin, including inside the mouth -shortness of breath -signs of infection - fever or chills, cough, sore throat, pain or difficulty passing urine -signs and symptoms of liver injury like dark  yellow or brown urine; general ill feeling or flu-like symptoms; light-colored stools; loss of appetite; nausea; right upper belly pain; unusually weak or tired; yellowing of the eyes or skin -swelling of the ankles, feet, hands -unusual bleeding or bruising Side effects that usually do not require medical attention (report to your doctor or health care professional if they continue or are bothersome): -back pain -blurred vision This list may not describe all possible side effects. Call your doctor for medical advice about side effects. You may report side effects to FDA at 1-800-FDA-1088. Where should I keep my medicine? Keep out of the reach of children. Store in original packaging at room temperature between 15 and 30 degrees C (59 and 86 degrees F). Throw away any unused medicine after the expiration date. NOTE: This sheet is a summary. It may not cover all possible information. If you have questions about this medicine, talk to your doctor, pharmacist, or health care provider.  2018 Elsevier/Gold Standard (2015-01-25 10:40:11)  Lenalidomide Oral Capsules What is this medicine? LENALIDOMIDE (len a LID oh mide) is a chemotherapy drug that targets specific proteins within cancer cells and stops the cancer cell from growing. It is used to treat multiple myeloma, mantle cell lymphoma, and some myelodysplastic syndromes that cause severe anemia requiring blood transfusions. This medicine may be used for other purposes; ask your health care provider or pharmacist if you have questions. COMMON BRAND NAME(S): Revlimid What should I tell my health care provider before I take this medicine? They need to know if you have any of these conditions: -blood clots in the legs or the lungs -high blood pressure -high cholesterol -infection -irregular monthly periods or menstrual cycles -kidney disease -liver disease -smoke tobacco -thyroid disease -an unusual or allergic reaction to lenalidomide,  thalidomide, other medicines, foods, dyes, or preservatives -pregnant or trying to get pregnant -breast-feeding How should I use this medicine? Take this medicine by mouth with a glass of water. Follow the directions on the prescription label. Do not cut, crush, or chew this medicine. Take your medicine at regular intervals. Do not take it more often than directed. Do not stop taking except on your doctor's advice. A MedGuide will be given with each prescription and refill. Read this guide carefully each time. The MedGuide may change frequently. Talk to your pediatrician regarding the use of this medicine in children. Special care may be needed. Overdosage: If you think you have taken too much of this medicine contact a poison control center or emergency room at once. NOTE: This medicine is only for you. Do not share this medicine with others. What if I miss a dose? If you miss a dose, take it as soon as you can. If your next dose is to be taken in less than 12 hours, then do not take the missed dose. Take the next dose at your regular time. Do not take double or extra doses. What may interact with this medicine? This medicine may interact with the following medications: -digoxin -medicines that increase the risk of thrombosis like estrogens or erythropoietic agents (e.g., epoetin alfa and darbepoetin alfa) -warfarin This list may not describe all possible interactions. Give your health  care provider a list of all the medicines, herbs, non-prescription drugs, or dietary supplements you use. Also tell them if you smoke, drink alcohol, or use illegal drugs. Some items may interact with your medicine. What should I watch for while using this medicine? You may need blood work done while you are taking this medicine. This medicine is available only through a special program. Doctors, pharmacies, and patients must meet all of the conditions of the program. Your health care provider will help you get  signed up with the program if you need this medicine. Through the program you will only receive up to a 28 day supply of the medicine at one time. You will need a new prescription for each refill. This medicine can cause birth defects. Do not get pregnant while taking this drug. Females with child-bearing potential will need to have 2 negative pregnancy tests before starting this medicine. Pregnancy testing must be done every 2 to 4 weeks as directed while taking this medicine. Use 2 reliable forms of birth control together while you are taking this medicine and for 4 weeks after you stop taking this medicine. If you think that you might be pregnant talk to your doctor right away. Do not breast-feed an infant while taking this medicine. Men must use a latex condom during sexual contact with a woman while taking this medicine and for 4 weeks after you stop taking this medicine. A latex condom is needed even if you have had a vasectomy. Contact your doctor right away if your partner becomes pregnant. Do not donate sperm while taking this medicine and for 4 weeks after you stop taking this medicine. Do not give blood while taking the medicine and for 4 weeks after completion of treatment to avoid exposing pregnant women to the medicine through the donated blood. Talk to your doctor about your risk of cancer. You may be more at risk for certain types of cancers if you take this medicine. What side effects may I notice from receiving this medicine? Side effects that you should report to your doctor or health care professional as soon as possible: -allergic reactions like skin rash, itching or hives, swelling of the face, lips, or tongue -breathing problems -chest pain or tightness -fast, irregular heartbeat -fever with rash, swollen lymph nodes, or swelling of the face -low blood counts - this medicine may decrease the number of white blood cells, red blood cells and platelets. You may be at increased risk  for infections and bleeding. -seizures -signs and symptoms of bleeding such as bloody or black, tarry stools; red or dark-brown urine; spitting up blood or brown material that looks like coffee grounds; red spots on the skin; unusual bruising or bleeding from the eye, gums, or nose -signs and symptoms of a blood clot such as breathing problems; changes in vision; chest pain; severe, sudden headache; pain, swelling, warmth in the leg; trouble speaking; sudden numbness or weakness of the face, arm or leg -signs and symptoms of liver injury like dark yellow or brown urine; general ill feeling or flu-like symptoms; light-colored stools; loss of appetite; nausea; right upper belly pain; unusually weak or tired; yellowing of the eyes or skin -signs and symptoms of a stroke like changes in vision; confusion; trouble speaking or understanding; severe headaches; sudden numbness or weakness of the face, arm or leg; trouble walking; dizziness; loss of balance or coordination -sweating -vomiting Side effects that usually do not require medical attention (report to your doctor or health care  professional if they continue or are bothersome): -constipation -cough -diarrhea -tiredness This list may not describe all possible side effects. Call your doctor for medical advice about side effects. You may report side effects to FDA at 1-800-FDA-1088. Where should I keep my medicine? Keep out of the reach of children. Store at room temperature between 15 and 30 degrees C (59 and 86 degrees F). Throw away any unused medicine after the expiration date. NOTE: This sheet is a summary. It may not cover all possible information. If you have questions about this medicine, talk to your doctor, pharmacist, or health care provider.  2018 Elsevier/Gold Standard (2015-09-25 16:43:51)

## 2016-09-03 LAB — MULTIPLE MYELOMA PANEL, SERUM
Albumin SerPl Elph-Mcnc: 3.7 g/dL (ref 2.9–4.4)
Albumin/Glob SerPl: 1.2 (ref 0.7–1.7)
Alpha 1: 0.2 g/dL (ref 0.0–0.4)
Alpha2 Glob SerPl Elph-Mcnc: 0.7 g/dL (ref 0.4–1.0)
B-Globulin SerPl Elph-Mcnc: 0.8 g/dL (ref 0.7–1.3)
GAMMA GLOB SERPL ELPH-MCNC: 1.4 g/dL (ref 0.4–1.8)
Globulin, Total: 3.1 g/dL (ref 2.2–3.9)
IGM (IMMUNOGLOBIN M), SRM: 46 mg/dL (ref 15–143)
IgA, Qn, Serum: 134 mg/dL (ref 61–437)
TOTAL PROTEIN: 6.8 g/dL (ref 6.0–8.5)

## 2016-09-03 LAB — KAPPA/LAMBDA LIGHT CHAINS
IG KAPPA FREE LIGHT CHAIN: 28.8 mg/L — AB (ref 3.3–19.4)
IG LAMBDA FREE LIGHT CHAIN: 15.4 mg/L (ref 5.7–26.3)
KAPPA/LAMBDA FLC RATIO: 1.87 — AB (ref 0.26–1.65)

## 2016-09-09 NOTE — Progress Notes (Signed)
[name]  HEMATOLOGY ONCOLOGY PROGRESS NOTE  Date of service: .09/02/2016  Patient Care Team: [name], MD as PCP - General (Family Medicine)  CC: f/u for multiple myeloma  Diagnosis:  IgG Kappa multiple myeloma   Current Treatment:  S/p CyBorD x 2 cycles  INTERVAL HISTORY:  Patient and his wife are here for follow-up for consideration of continued treatment of the patients multiple myeloma.  He did see the dentist and had to treat extracted with resolution of his dental abscess. No acute dental issues at this time. Has gained about 2 pounds since his last visit and continues to eat better. No other acute new symptoms at this time. No diarrhea. Has had some issues with neuropathy which are being evaluated at [facility] he notes he had no conduction study recently. He has been started on Neurontin 300 mg by mouth at bedtime for this. He also had ABIs done which show mild resting arterial insufficiency.  . Wt Readings from Last 3 Encounters:  09/02/16 147 lb 8 oz (66.9 kg)  08/05/16 145 lb 11.2 oz (66.1 kg)  06/30/16 140 lb 11.2 oz (63.8 kg)    REVIEW OF SYSTEMS:    10 Point review of systems of done and is negative except as noted above.  . Past Medical History:  Diagnosis Date  . CAD (coronary artery disease) 2006   stent LAD 3.0x18 [place], occluded RCA  . Cancer ([place])    multiple myeloma  . Diabetes mellitus without complication ([place])   . H/O non-insulin dependent diabetes mellitus   . Hyperlipidemia   . Hypertension     . Past Surgical History:  Procedure Laterality Date  . CARDIAC CATHETERIZATION  2006  . IR FLUORO GUIDE CV LINE RIGHT  05/20/2016  . IR US GUIDE VASC ACCESS RIGHT  05/20/2016    . Social History  Substance Use Topics  . Smoking status: Never Smoker  . Smokeless tobacco: Never Used  . Alcohol use No    ALLERGIES:  has No Known Allergies.  MEDICATIONS:  Current Outpatient Prescriptions  Medication Sig Dispense  Refill  . gabapentin (NEURONTIN) 100 MG capsule Take 200 mg by mouth at bedtime.    . vitamin B-12 (CYANOCOBALAMIN) 1000 MCG tablet Take 1,000 mcg by mouth daily.    [name] acyclovir (ZOVIRAX) 200 MG capsule Take 1 capsule (200 mg total) by mouth 2 (two) times daily. 30 capsule 3  . Amino Acids-Protein Hydrolys (FEEDING SUPPLEMENT, PRO-STAT SUGAR FREE 64,) LIQD Take 30 mLs by mouth 2 (two) times daily. 900 mL 0  . amLODipine (NORVASC) 10 MG tablet Take 10 mg by mouth daily.    [name] aspirin 81 MG tablet Take 81 mg by mouth daily.    [name] atorvastatin (LIPITOR) 20 MG tablet Take 1 tablet (20 mg total) by mouth daily. 90 tablet 3  . chlorhexidine (PERIDEX) 0.12 % solution Use as directed 15 mLs in the mouth or throat 2 (two) times daily. (Patient taking differently: Use as directed 15 mLs in the mouth or throat as needed. ) 473 mL 0  . colestipol (COLESTID) 1 g tablet Take 1 tablet (1 g total) by mouth 2 (two) times daily. 24 tablet 0  . feeding supplement, GLUCERNA SHAKE, (GLUCERNA SHAKE) LIQD Take 237 mLs by mouth 3 (three) times daily as needed.    . insulin aspart (NOVOLOG) 100 UNIT/ML FlexPen Three times a day before meals per sliding scale. between 2-6 on average    . Insulin Glargine (  LANTUS SOLOSTAR) 100 UNIT/ML Solostar Pen Inject 20 Units into the skin daily. 15 mL 11  . metoprolol tartrate (LOPRESSOR) 100 MG tablet Take 100 mg by mouth once.    . nitroGLYCERIN (NITROSTAT) 0.4 MG SL tablet Place 1 tablet (0.4 mg total) under the tongue every 5 (five) minutes x 3 doses as needed for chest pain. 30 tablet 12  . ondansetron (ZOFRAN) 8 MG tablet Take 1 tablet (8 mg total) by mouth 2 (two) times daily as needed for refractory nausea / vomiting. 30 tablet 1  . potassium chloride 20 MEQ/15ML (10%) SOLN Take 30 mLs (40 mEq total) by mouth 3 (three) times daily. 450 mL 0  . prochlorperazine (COMPAZINE) 10 MG tablet Take 1 tablet (10 mg total) by mouth every 6 (six) hours as needed (Nausea or vomiting). 30 tablet  1  . saccharomyces boulardii (FLORASTOR) 250 MG capsule Take 1 capsule (250 mg total) by mouth 2 (two) times daily. 60 capsule 0  . tamsulosin (FLOMAX) 0.4 MG CAPS capsule Take 1 capsule (0.4 mg total) by mouth daily after supper. 30 capsule 2   No current facility-administered medications for this visit.     PHYSICAL EXAMINATION: ECOG PERFORMANCE STATUS: 1 - Symptomatic but completely ambulatory  . Vitals:   09/02/16 1449  BP: (!) 146/78  Pulse: 70  Resp: 18  Temp: 98.1 F (36.7 C)  SpO2: 100%    Filed Weights   09/02/16 1449  Weight: 147 lb 8 oz (66.9 kg)   .Body mass index is 20.57 kg/m.  GENERAL:alert, in no acute distress and comfortable SKIN: no acute rashes, no significant lesions EYES: conjunctiva are pink and non-injected, sclera anicteric OROPHARYNX: MMM, no exudates, no oropharyngeal erythema or ulceration NECK: supple, no JVD LYMPH:  no palpable lymphadenopathy in the cervical, axillary or inguinal regions LUNGS: clear to auscultation b/l with normal respiratory effort HEART: regular rate & rhythm ABDOMEN:  normoactive bowel sounds , non tender, not distended. Extremity: no pedal edema PSYCH: alert & oriented x 3 with fluent speech NEURO: no focal motor/sensory deficits  LABORATORY DATA:   I have reviewed the data as listed  . CBC Latest Ref Rng & Units 09/02/2016 08/05/2016 06/30/2016  WBC 4.0 - 10.3 10e3/uL 3.0(L) 5.1 6.3  Hemoglobin 13.0 - 17.1 g/dL 10.2(L) 10.5(L) 10.4(L)  Hematocrit 38.4 - 49.9 % 32.1(L) 32.7(L) 31.6(L)  Platelets 140 - 400 10e3/uL 198 221 227    . CMP Latest Ref Rng & Units 09/02/2016 09/02/2016 08/05/2016  Glucose 70 - 140 mg/dl 169(H) - 134  BUN 7.0 - 26.0 mg/dL 20.7 - 19.3  Creatinine 0.7 - 1.3 mg/dL 1.5(H) - 1.5(H)  Sodium 136 - 145 mEq/L 138 - 140  Potassium 3.5 - 5.1 mEq/L 4.0 - 4.5  Chloride 101 - 111 mmol/L - - -  CO2 22 - 29 mEq/L 26 - 26  Calcium 8.4 - 10.4 mg/dL 9.2 - 9.4  Total Protein 6.0 - 8.5 g/dL 7.1 6.8 7.0    Total Bilirubin 0.20 - 1.20 mg/dL 0.24 - 0.33  Alkaline Phos 40 - 150 U/L 67 - 64  AST 5 - 34 U/L 15 - 19  ALT 0 - 55 U/L 14 - 19          RADIOGRAPHIC STUDIES: I have personally reviewed the radiological images as listed and agreed with the findings in the report. No results found.  ASSESSMENT & PLAN:   71 yo with multiple medical comorbidities but fairly good performance status overall with  1) ISS Stage II IgG Kapppa multiple myeloma. Anemia + Renal insufficiency. Bone survey neg for concerning bone lesions. significantly elevated kappafree light chains 858 mg percent, lambda 18.4, with a ratio of 46.65 . He was also noted to have an M spike of 1.7 g/dL with IFE showing IgG kappa monoclonal protein. Quantitative IgG level was increased to nearly 2500 mg/dL. 24-hour UPEP showed monoclonal protein of 1300 mg per 24 hours which constituted about 86% of all the urinary protein. Bone marrow biopsy showed 20-30% Restricted plasma cells consistent with multiple myeloma Cytogenetics/FISH  Showed   Patient has completed 2 cycles of CyBorD with excellent response to treatment. Unfortunate has had delay in further treatment due to severe clostridium difficile colitis. (Now resolved)  Labs from today show his myeloma response to treatment has held thus far Repeat labs from today show his M spike and IFE have resolved. Serum kappa light chains and kappa lambda ratio are gradually increasing.   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 Had an element of obstructive uropathy due to BPH which has resolved. Creatinine continues to improve from 3.5 to 2.9 to 1.9 to 1.6 to 1.5 Had AKI while hospitalized from sepsis, dehydration , IV contrast -- now resolved. Creatinine down to 1.5 and stable 3) Normocytic anemia likely related to multiple myeloma.-stable 4) Severe Clostridium difficile colitis - resolved. Continues to be on probiotics. ?  Triggered b Clindamycin for dental periodontal abscess. 5) significant weight loss from sepsis and c diff colitis. Continues to improve . Wt Readings from Last 3 Encounters:  09/02/16 147 lb 8 oz (66.9 kg)  08/05/16 145 lb 11.2 oz (66.1 kg)  06/30/16 140 lb 11.2 oz (63.8 kg)   5) Poor dentition with previous right mandibular dental abscess . Patient did eventually see the dental and had a couple of teeth extracted with resolution of dental abscess at this time. 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 -given resolution of M spike and normalization of IFE as well as borderline abnormal SFLC ratio and his tolerance to treatments will consider maintenance Revlimid at this time.  5) DM2 -  -mx per PCP  RTC with Dr Irene Limbo with labs in 2 weeks with labs to discuss treatment options  I spent 20 minutes counseling the patient face to face. The total time spent in the appointment was 25 minutes and more than 50% was on counseling and direct patient cares.    Sullivan Lone MD [place] AAHIVMS [facility] Hematology/Oncology Physician [facility]  (Office):       [phone] (Work cell):  [phone] (Fax):           [phone]

## 2016-09-10 ENCOUNTER — Telehealth: Payer: Self-pay

## 2016-09-10 NOTE — Telephone Encounter (Signed)
Pt to call back. Needs to be scheduled for 2 week f/u with labs. CBC and CMET to be ordered when appt created.

## 2016-09-17 ENCOUNTER — Telehealth: Payer: Self-pay | Admitting: *Deleted

## 2016-09-17 ENCOUNTER — Ambulatory Visit: Payer: Medicare HMO | Admitting: Hematology

## 2016-09-17 ENCOUNTER — Other Ambulatory Visit: Payer: Medicare HMO
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
## 2016-09-25 NOTE — Progress Notes (Signed)
[name]  HEMATOLOGY ONCOLOGY PROGRESS NOTE  Date of service: 09/26/16   Patient Care Team: [name], MD as PCP - General (Family Medicine)  CC: f/u for multiple myeloma  Diagnosis:  IgG Kappa multiple myeloma   Current Treatment:  Intending to start Revlimid (renally adjusted) + Dexamethasone  Previous Treatment s/p CyBorD x 2 cycles  INTERVAL HISTORY:  Patient and his wife are here for follow-up to discuss treatment options for his multiple myeloma. He notes his neuropathy is somewhat better after he was started on Neurontin at [facility] neurology. He continues to be on B12 replacement. Notes that he is optimizing his diabetes management. We discussed that his serum free light chain ratio is progressively getting abnormal though his M spike as of 8/28 was still undetectable. We discussed several treatment options including observation versus additional treatment for a couple of cycles to achieve CR and reassessing disease state versus referral to transplant at this time. We will hold off on Velcade given his critical care and diabetic neuropathy at this time and/or holding off on cyclophosphamide given his recent significant C. difficile infection. He is agreeable to start renally adjusted Revlimid plus dexamethasone/20 mg weekly with the plan to do this for a couple of cycles to achieve CR. He is already on aspirin for CAD and CVA prophylaxis. No other acute new symptoms at this time. No new bone pains. Renal function is relatively stable. Does not note any overt symptoms of urinary obstruction.   REVIEW OF SYSTEMS:    10 Point review of systems of done and is negative except as noted above.  . Past Medical History:  Diagnosis Date  . CAD (coronary artery disease) 2006   stent LAD 3.0x18 [place], occluded RCA  . Cancer ([place])    multiple myeloma  . Diabetes mellitus without complication ([place])   . H/O non-insulin dependent diabetes mellitus   . Hyperlipidemia    . Hypertension     . Past Surgical History:  Procedure Laterality Date  . CARDIAC CATHETERIZATION  2006  . IR FLUORO GUIDE CV LINE RIGHT  05/20/2016  . IR US GUIDE VASC ACCESS RIGHT  05/20/2016    . Social History  Substance Use Topics  . Smoking status: Never Smoker  . Smokeless tobacco: Never Used  . Alcohol use No    ALLERGIES:  has No Known Allergies.  MEDICATIONS:  Current Outpatient Prescriptions  Medication Sig Dispense Refill  . acyclovir (ZOVIRAX) 200 MG capsule Take 1 capsule (200 mg total) by mouth 2 (two) times daily. 30 capsule 3  . Amino Acids-Protein Hydrolys (FEEDING SUPPLEMENT, PRO-STAT SUGAR FREE 64,) LIQD Take 30 mLs by mouth 2 (two) times daily. 900 mL 0  . amLODipine (NORVASC) 10 MG tablet Take 10 mg by mouth daily.    [name] aspirin 81 MG tablet Take 81 mg by mouth daily.    [name] atorvastatin (LIPITOR) 20 MG tablet Take 1 tablet (20 mg total) by mouth daily. 90 tablet 3  . chlorhexidine (PERIDEX) 0.12 % solution Use as directed 15 mLs in the mouth or throat 2 (two) times daily. (Patient taking differently: Use as directed 15 mLs in the mouth or throat as needed. ) 473 mL 0  . colestipol (COLESTID) 1 g tablet Take 1 tablet (1 g total) by mouth 2 (two) times daily. 24 tablet 0  . dexamethasone (DECADRON) 4 MG tablet Take 5 tablets (20 mg total) by mouth once a week. 20 tablet 2  . feeding supplement, GLUCERNA  SHAKE, (GLUCERNA SHAKE) LIQD Take 237 mLs by mouth 3 (three) times daily as needed.    . gabapentin (NEURONTIN) 100 MG capsule Take 200 mg by mouth at bedtime.    . insulin aspart (NOVOLOG) 100 UNIT/ML FlexPen Three times a day before meals per sliding scale. between 2-6 on average    . Insulin Glargine (LANTUS SOLOSTAR) 100 UNIT/ML Solostar Pen Inject 20 Units into the skin daily. 15 mL 11  . lenalidomide (REVLIMID) 10 MG capsule Take 1 capsule (10 mg total) by mouth daily. Take po daily for 21 days then 7 days off 21 capsule 1  . metoprolol tartrate (LOPRESSOR)  100 MG tablet Take 100 mg by mouth once.    . nitroGLYCERIN (NITROSTAT) 0.4 MG SL tablet Place 1 tablet (0.4 mg total) under the tongue every 5 (five) minutes x 3 doses as needed for chest pain. 30 tablet 12  . ondansetron (ZOFRAN) 8 MG tablet Take 1 tablet (8 mg total) by mouth 2 (two) times daily as needed for refractory nausea / vomiting. 30 tablet 1  . potassium chloride 20 MEQ/15ML (10%) SOLN Take 30 mLs (40 mEq total) by mouth 3 (three) times daily. 450 mL 0  . prochlorperazine (COMPAZINE) 10 MG tablet Take 1 tablet (10 mg total) by mouth every 6 (six) hours as needed (Nausea or vomiting). 30 tablet 1  . saccharomyces boulardii (FLORASTOR) 250 MG capsule Take 1 capsule (250 mg total) by mouth 2 (two) times daily. 60 capsule 0  . tamsulosin (FLOMAX) 0.4 MG CAPS capsule Take 1 capsule (0.4 mg total) by mouth daily after supper. 30 capsule 2  . vitamin B-12 (CYANOCOBALAMIN) 1000 MCG tablet Take 1,000 mcg by mouth daily.     No current facility-administered medications for this visit.     PHYSICAL EXAMINATION:  ECOG PERFORMANCE STATUS: 1 - Symptomatic but completely ambulatory  . Vitals:   09/26/16 1014  BP: (!) 165/86  Pulse: 74  Resp: 18  Temp: 98.1 F (36.7 C)  SpO2: 100%    Filed Weights   09/26/16 1014  Weight: 152 lb (68.9 kg)   .Body mass index is 21.2 kg/m.  GENERAL:alert, in no acute distress and comfortable SKIN: no acute rashes, no significant lesions EYES: conjunctiva are pink and non-injected, sclera anicteric OROPHARYNX: MMM, no exudates, no oropharyngeal erythema or ulceration NECK: supple, no JVD LYMPH:  no palpable lymphadenopathy in the cervical, axillary or inguinal regions LUNGS: clear to auscultation b/l with normal respiratory effort HEART: regular rate & rhythm ABDOMEN:  normoactive bowel sounds , non tender, not distended. Extremity: no pedal edema PSYCH: alert & oriented x 3 with fluent speech NEURO: no focal motor/sensory deficits  LABORATORY  DATA:   I have reviewed the data as listed  . CBC Latest Ref Rng & Units 09/26/2016 09/02/2016 08/05/2016  WBC 4.0 - 10.3 10e3/uL 2.8(L) 3.0(L) 5.1  Hemoglobin 13.0 - 17.1 g/dL 11.3(L) 10.2(L) 10.5(L)  Hematocrit 38.4 - 49.9 % 35.3(L) 32.1(L) 32.7(L)  Platelets 140 - 400 10e3/uL 184 198 221    . CMP Latest Ref Rng & Units 09/26/2016 09/02/2016 09/02/2016  Glucose 70 - 140 mg/dl 148(H) 169(H) -  BUN 7.0 - 26.0 mg/dL 25.4 20.7 -  Creatinine 0.7 - 1.3 mg/dL 1.6(H) 1.5(H) -  Sodium 136 - 145 mEq/L 141 138 -  Potassium 3.5 - 5.1 mEq/L 4.0 4.0 -  Chloride 101 - 111 mmol/L - - -  CO2 22 - 29 mEq/L 24 26 -  Calcium 8.4 - 10.4 mg/dL  9.5 9.2 -  Total Protein 6.4 - 8.3 g/dL 7.8 7.1 6.8  Total Bilirubin 0.20 - 1.20 mg/dL 0.22 0.24 -  Alkaline Phos 40 - 150 U/L 77 67 -  AST 5 - 34 U/L 18 15 -  ALT 0 - 55 U/L 14 14 -          RADIOGRAPHIC STUDIES: I have personally reviewed the radiological images as listed and agreed with the findings in the report. No results found.  ASSESSMENT & PLAN:  71 yo with multiple medical comorbidities but fairly good performance status overall with   1) ISS Stage II IgG Kapppa multiple myeloma. Anemia + Renal insufficiency. Bone survey neg for concerning bone lesions. significantly elevated kappafree light chains 858 mg percent, lambda 18.4, with a ratio of 46.65 . He was also noted to have an M spike of 1.7 g/dL with IFE showing IgG kappa monoclonal protein. Quantitative IgG level was increased to nearly 2500 mg/dL. 24-hour UPEP showed monoclonal protein of 1300 mg per 24 hours which constituted about 86% of all the urinary protein. 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 Had an element of obstructive uropathy due to BPH which has resolved. Creatinine continues to improve from 3.5 to 2.9 to  1.9 to 1.6 and has remained stable around 1.5-1.6. 3) Normocytic anemia likely related to multiple myeloma.-stable, improving Plan - labs today showed improving anemia and stable renal function -His myeloma labs from 09/02/2016 show no M spike and gradually increasing kappa Lambda serum free light chain ratios. -We discussed latest treatment options. We'll hold off on Velcade at this time due to neuropathy and cyclophosphamide due to significant C. Difficile. -We will plan to treat him with really adjusted Revlimid at 10 mg by mouth daily for 21 days with 7 days off + dexamethasone 20 mg by mouth weekly . -We discussed the pros and cons and potential adverse effects from Revlimid and he understands this. -he will be on aspirin for DVT CAD and CVA prophylaxis -Continue acyclovir for at least 6 months after last exposure to Velcade. -Continue follow-up with nephrology- patient is following with Dr Ila Mcgill. -No indication for PRBC transfusion at this time. -Follow-up myeloma panel and serum free light chain labs from today. -After 2 cycles of Revlimid plus dexamethasone the serological parameters suggests complete response we will repeat bone marrow and PET scan to confirm CR status and at that point we'll have to determine maintenance versus referral to transplant based on patient's preference.  5) Poor dentition with right mandibular dental abscess . Patient is completed course of clindamycin and has had dental extractions with resolution of dental issues at this time   6) DM2 - poorly controlled with multiple episodes of significant hypoglycemia. Had blood sugar 23 requiring EMS treatment today. - continue management as per primary care physician   -RTC in 4 weeks with Dr Irene Limbo with labs for toxicity check (for Revlimid)  I spent 30 minutes counseling the patient face to face. The total time spent in the appointment was 40 minutes and more than 50% was on counseling and direct patient  cares.    Sullivan Lone MD [place] AAHIVMS [facility] Hematology/Oncology Physician [facility]  (Office):       [phone] (Work cell):  [phone] (Fax):           [phone]  This document  serves as a record of services personally performed by Sullivan Lone, MD. It was created on her behalf by Margit Banda, a trained medical scribe. The creation of this record is based on the scribe's personal observations and the provider's statements to them. This document has been checked and approved by the attending provider.

## 2016-09-26 ENCOUNTER — Telehealth: Payer: Self-pay | Admitting: Hematology

## 2016-09-26 ENCOUNTER — Other Ambulatory Visit (HOSPITAL_BASED_OUTPATIENT_CLINIC_OR_DEPARTMENT_OTHER): Payer: Medicare HMO

## 2016-09-26 ENCOUNTER — Ambulatory Visit (HOSPITAL_BASED_OUTPATIENT_CLINIC_OR_DEPARTMENT_OTHER): Payer: Medicare HMO | Admitting: Hematology

## 2016-09-26 VITALS — BP 165/86 | HR 74 | Temp 98.1°F | Resp 18 | Ht 71.0 in | Wt 152.0 lb

## 2016-09-26 DIAGNOSIS — N179 Acute kidney failure, unspecified: Secondary | ICD-10-CM

## 2016-09-26 DIAGNOSIS — D649 Anemia, unspecified: Secondary | ICD-10-CM

## 2016-09-26 DIAGNOSIS — D631 Anemia in chronic kidney disease: Secondary | ICD-10-CM | POA: Diagnosis not present

## 2016-09-26 DIAGNOSIS — C9000 Multiple myeloma not having achieved remission: Secondary | ICD-10-CM

## 2016-09-26 DIAGNOSIS — E1140 Type 2 diabetes mellitus with diabetic neuropathy, unspecified: Secondary | ICD-10-CM

## 2016-09-26 DIAGNOSIS — N189 Chronic kidney disease, unspecified: Secondary | ICD-10-CM | POA: Diagnosis not present

## 2016-09-26 LAB — COMPREHENSIVE METABOLIC PANEL
ALT: 14 U/L (ref 0–55)
AST: 18 U/L (ref 5–34)
Albumin: 3.9 g/dL (ref 3.5–5.0)
Alkaline Phosphatase: 77 U/L (ref 40–150)
Anion Gap: 10 mEq/L (ref 3–11)
BUN: 25.4 mg/dL (ref 7.0–26.0)
CALCIUM: 9.5 mg/dL (ref 8.4–10.4)
CHLORIDE: 107 meq/L (ref 98–109)
CO2: 24 mEq/L (ref 22–29)
Creatinine: 1.6 mg/dL — ABNORMAL HIGH (ref 0.7–1.3)
EGFR: 49 mL/min/{1.73_m2} — ABNORMAL LOW (ref >90)
Glucose: 148 mg/dl — ABNORMAL HIGH (ref 70–140)
POTASSIUM: 4 meq/L (ref 3.5–5.1)
Sodium: 141 mEq/L (ref 136–145)
Total Bilirubin: 0.22 mg/dL (ref 0.20–1.20)
Total Protein: 7.8 g/dL (ref 6.4–8.3)

## 2016-09-26 LAB — CBC & DIFF AND RETIC
BASO%: 0.4 % (ref 0.0–2.0)
Basophils Absolute: 0 10*3/uL (ref 0.0–0.1)
EOS%: 2.5 % (ref 0.0–7.0)
Eosinophils Absolute: 0.1 10*3/uL (ref 0.0–0.5)
HEMATOCRIT: 35.3 % — AB (ref 38.4–49.9)
HGB: 11.3 g/dL — ABNORMAL LOW (ref 13.0–17.1)
Immature Retic Fract: 2.1 % — ABNORMAL LOW (ref 3.00–10.60)
LYMPH%: 38.9 % (ref 14.0–49.0)
MCH: 28 pg (ref 27.2–33.4)
MCHC: 32 g/dL (ref 32.0–36.0)
MCV: 87.4 fL (ref 79.3–98.0)
MONO#: 0.3 10*3/uL (ref 0.1–0.9)
MONO%: 12.4 % (ref 0.0–14.0)
NEUT#: 1.3 10*3/uL — ABNORMAL LOW (ref 1.5–6.5)
NEUT%: 45.8 % (ref 39.0–75.0)
PLATELETS: 184 10*3/uL (ref 140–400)
RBC: 4.04 10*6/uL — ABNORMAL LOW (ref 4.20–5.82)
RDW: 14.5 % (ref 11.0–14.6)
Retic %: 0.68 % — ABNORMAL LOW (ref 0.80–1.80)
Retic Ct Abs: 27.47 10*3/uL — ABNORMAL LOW (ref 34.80–93.90)
WBC: 2.8 10*3/uL — ABNORMAL LOW (ref 4.0–10.3)
lymph#: 1.1 10*3/uL (ref 0.9–3.3)

## 2016-09-26 NOTE — Telephone Encounter (Signed)
Scheduled appt per 9/21 los - Gave patient AVS and calender per los.  

## 2016-09-29 ENCOUNTER — Other Ambulatory Visit: Payer: Self-pay

## 2016-09-29 ENCOUNTER — Telehealth: Payer: Self-pay

## 2016-09-29 ENCOUNTER — Telehealth: Payer: Self-pay | Admitting: Pharmacist

## 2016-09-29 DIAGNOSIS — C9000 Multiple myeloma not having achieved remission: Secondary | ICD-10-CM

## 2016-09-29 LAB — KAPPA/LAMBDA LIGHT CHAINS
IG KAPPA FREE LIGHT CHAIN: 26.9 mg/L — AB (ref 3.3–19.4)
IG LAMBDA FREE LIGHT CHAIN: 16 mg/L (ref 5.7–26.3)
Kappa/Lambda FluidC Ratio: 1.68 — ABNORMAL HIGH (ref 0.26–1.65)

## 2016-09-29 MED ORDER — LENALIDOMIDE 10 MG PO CAPS: 10.0000 mg | ORAL_CAPSULE | Freq: Every day | ORAL | 1 refills | Status: DC

## 2016-09-29 MED ORDER — LENALIDOMIDE 10 MG PO CAPS: 10.0000 mg | ORAL_CAPSULE | Freq: Every day | ORAL | 0 refills | Status: DC

## 2016-09-29 MED ORDER — DEXAMETHASONE 4 MG PO TABS: 20.0000 mg | ORAL_TABLET | ORAL | 2 refills | Status: DC

## 2016-09-29 NOTE — Telephone Encounter (Signed)
Pt has decided he will take the revlimid. They had made a decision during OV to go ahead with revlimid then called later that they changed their minds. They have decided to go ahead.  This RN explained that Revlimid would need PA from insurance and will be mailed from a specialty pharmacy. Instructed Arnetta to call when revlimid is received to get instructions about when to start taking revlimid.

## 2016-09-29 NOTE — Telephone Encounter (Signed)
Oral Chemotherapy Pharmacist Encounter  I spoke with patient for overview of new oral chemotherapy medication: lenalidomide, along with dexamethasone, for the treatment of multiple myeloma, for a few cycles to achieve CR. Currently holding off on bortezomib due to diabetic neuropathy and cyclophosphamide due to recent C. Diff infection.   Counseled patient on administration, dosing, side effects, safe handling, and monitoring.  Patient will take Revlimid 1m PO daily with water, with or without food D1-21 every 28 days. He will also take dexamethasone 280mPO once weekly in the morning with food.  Start date: pending receipt of medication. Instructed patient to call Dr. KaGrier Mittsffice once he receives medication to set start date.   Side effects include but not limited to: diarrhea, nausea, fatigue, anemia/neutropenia/thrombocytopenia, rash. We also discussed importance of continuing aspirin 8164maily for VTE prophylaxis. He will also need to monitor his sugars closely with the dexamethasone. He said he required sliding scale insulin in the past while on steroids. He agreed to tell his primary care physician he was taking these new medications.   Labs from 9/21 assessed, lenalidomide dose appropriate for CrCL ~40. ANC low but > 1000.   Current medication list in Epic reviewed, DDIs with lenalidomide identified: educated to take lenalidomide (and all other drugs) 1 hour before of 4 hours after colestipol.  Reviewed with patient importance of keeping a medication schedule and plan for any missed doses.  Mr. GarKanaiced understanding and appreciation.  Prescription has been e-scribed to [facility] on 9/24 for benefits analysis and approval.  All questions answered. His wife may call back if she has questions.   Patient knows to call the office with questions or concerns. Oral Oncology Clinic will continue to follow.  Thank you, MagDemetrius CharityharmD PGY2 Oncology Pharmacy Resident   Pharmacy Phone: [phone]

## 2016-09-30 ENCOUNTER — Telehealth: Payer: Self-pay | Admitting: Pharmacy Technician

## 2016-09-30 NOTE — Telephone Encounter (Signed)
Oral Oncology Patient Advocate Encounter  Received notification from [facility] that prior authorization for Revlimid is required.  PA submitted on CoverMyMeds Key [id number] Status is pending  Oral Oncology Clinic will continue to follow.  Douglas Rose. [name], [name] Patient [place] [phone] 09/30/2016 11:26 AM

## 2016-10-01 LAB — MULTIPLE MYELOMA PANEL, SERUM
ALBUMIN/GLOB SERPL: 1.1 (ref 0.7–1.7)
ALPHA 1: 0.2 g/dL (ref 0.0–0.4)
ALPHA2 GLOB SERPL ELPH-MCNC: 0.7 g/dL (ref 0.4–1.0)
Albumin SerPl Elph-Mcnc: 3.7 g/dL (ref 2.9–4.4)
B-Globulin SerPl Elph-Mcnc: 0.9 g/dL (ref 0.7–1.3)
GAMMA GLOB SERPL ELPH-MCNC: 1.6 g/dL (ref 0.4–1.8)
GLOBULIN, TOTAL: 3.4 g/dL (ref 2.2–3.9)
IgA, Qn, Serum: 132 mg/dL (ref 61–437)
IgM, Qn, Serum: 49 mg/dL (ref 15–143)
Total Protein: 7.1 g/dL (ref 6.0–8.5)

## 2016-10-01 NOTE — Telephone Encounter (Signed)
Oral Oncology Patient Advocate Encounter  Prior Authorization for Revlimid has been approved.    PA# [id number] Effective dates: 10/03/2016 through 03/29/2017  Oral Oncology Clinic will continue to follow.   Douglas Rose. [name], [place] Patient [place] [phone] 10/01/2016 8:22 AM

## 2016-10-02 NOTE — Telephone Encounter (Signed)
Oral Oncology Patient Advocate Encounter  Received communication from [facility] that patient's Revlimid has been shipped for with expected arrival to the patient's home on 10-03-16.  Copay = $0.    Fabio Asa. [name], [place] Patient [place] [phone] 10/02/2016 1:08 PM

## 2016-10-03 ENCOUNTER — Telehealth: Payer: Self-pay

## 2016-10-03 NOTE — Telephone Encounter (Signed)
Spoke with pt as a return call. Revlimid was received at home today, but pt away from home. Will start taking tomorrow for 21 days. Pt aware to call with changes or symptoms. F/u with [name] on 10/20/16. Rescheduled per MD. Pt read back of appt date and times: Lab 10/15 at 1pm and doctor 10/15 at 1:20pm.

## 2016-10-06 ENCOUNTER — Telehealth: Payer: Self-pay | Admitting: *Deleted

## 2016-10-06 NOTE — Telephone Encounter (Signed)
"  Please have [name] return our call, [phone] or we need to get an appointment to talk before beginning the Revlimid medication.  Thank you.".   Routing call information to collaborative nurse and provider for review.  Further patient communication through collaborative nurse.

## 2016-10-07 ENCOUNTER — Telehealth: Payer: Self-pay | Admitting: Pharmacist

## 2016-10-07 DIAGNOSIS — E1159 Type 2 diabetes mellitus with other circulatory complications: Secondary | ICD-10-CM

## 2016-10-07 DIAGNOSIS — Z794 Long term (current) use of insulin: Principal | ICD-10-CM

## 2016-10-07 NOTE — Telephone Encounter (Signed)
Oral Chemotherapy Pharmacist Encounter   I spoke with patient's wife, Cecil Cranker, for overview of: Revlimid as they have further questions about medication regimen.   Pt is doing well. Counseled patient on administration, dosing, side effects, monitoring, drug-food interactions, safe handling, storage, and disposal.  Patient will take Revlimid 10mg  capsules, 1 capsule by mouth once daily for 21 days on, 7 days off, with a glass of water, with or without food.  Patient taking Revlimid at bedtime. Patient will take dexamethasone 4mg  tablets, 5 tablets (20mg ) by mouth once weekly with breakfast.  Dexamethasone will be taken on Mondays. Revlimid and dexamethasone start date: 10/06/16  Medication reconciliation completed, patient not taking colestipol any longer. Several medications removed from medication list. Arnetta stated she was confused about how to take the dexamethasone and patient only received 1 tablet (4mg ) on Monday 10/1. She will make sure that patient takes 5 tablets (20mg ) on Monday 10/8.  Side effects of Revlimid include but not limited to: diarrhea, nausea, fatigue, anemia/neutropenia/thrombocytopenia, and rash.    Reviewed with patient importance of keeping a medication schedule and plan for any missed doses.  Mr. And Mrs. Carn voiced understanding and appreciation.   All questions answered. Confirmed [place] appointments for 10/20/16.  Patient knows to call the office with questions or concerns. Oral Oncology Clinic will continue to follow.  Thank you,  Johny Drilling, PharmD, BCPS, BCOP 10/07/2016  2:09 PM Oral Oncology Clinic [phone]

## 2016-10-20 ENCOUNTER — Telehealth: Payer: Self-pay | Admitting: Hematology

## 2016-10-20 ENCOUNTER — Ambulatory Visit (HOSPITAL_BASED_OUTPATIENT_CLINIC_OR_DEPARTMENT_OTHER): Payer: Medicare HMO | Admitting: Hematology

## 2016-10-20 ENCOUNTER — Other Ambulatory Visit (HOSPITAL_BASED_OUTPATIENT_CLINIC_OR_DEPARTMENT_OTHER): Payer: Medicare HMO

## 2016-10-20 VITALS — BP 147/77 | HR 67 | Temp 98.0°F | Resp 17 | Ht 71.0 in | Wt 154.5 lb

## 2016-10-20 DIAGNOSIS — N179 Acute kidney failure, unspecified: Secondary | ICD-10-CM | POA: Diagnosis not present

## 2016-10-20 DIAGNOSIS — D472 Monoclonal gammopathy: Secondary | ICD-10-CM | POA: Diagnosis not present

## 2016-10-20 DIAGNOSIS — C9000 Multiple myeloma not having achieved remission: Secondary | ICD-10-CM

## 2016-10-20 DIAGNOSIS — N189 Chronic kidney disease, unspecified: Secondary | ICD-10-CM | POA: Diagnosis not present

## 2016-10-20 DIAGNOSIS — D631 Anemia in chronic kidney disease: Secondary | ICD-10-CM

## 2016-10-20 DIAGNOSIS — N08 Glomerular disorders in diseases classified elsewhere: Secondary | ICD-10-CM

## 2016-10-20 DIAGNOSIS — E119 Type 2 diabetes mellitus without complications: Secondary | ICD-10-CM

## 2016-10-20 LAB — CBC & DIFF AND RETIC
BASO%: 0.2 % (ref 0.0–2.0)
Basophils Absolute: 0 10e3/uL (ref 0.0–0.1)
EOS%: 3.5 % (ref 0.0–7.0)
Eosinophils Absolute: 0.2 10e3/uL (ref 0.0–0.5)
HCT: 34.9 % — ABNORMAL LOW (ref 38.4–49.9)
HGB: 11.4 g/dL — ABNORMAL LOW (ref 13.0–17.1)
Immature Retic Fract: 1.2 % — ABNORMAL LOW (ref 3.00–10.60)
LYMPH%: 26.2 % (ref 14.0–49.0)
MCH: 27.9 pg (ref 27.2–33.4)
MCHC: 32.7 g/dL (ref 32.0–36.0)
MCV: 85.3 fL (ref 79.3–98.0)
MONO#: 0.6 10e3/uL (ref 0.1–0.9)
MONO%: 10.3 % (ref 0.0–14.0)
NEUT#: 3.3 10e3/uL (ref 1.5–6.5)
NEUT%: 59.8 % (ref 39.0–75.0)
Platelets: 184 10e3/uL (ref 140–400)
RBC: 4.09 10e6/uL — ABNORMAL LOW (ref 4.20–5.82)
RDW: 15 % — ABNORMAL HIGH (ref 11.0–14.6)
Retic %: 0.69 % — ABNORMAL LOW (ref 0.80–1.80)
Retic Ct Abs: 28.22 10e3/uL — ABNORMAL LOW (ref 34.80–93.90)
WBC: 5.5 10e3/uL (ref 4.0–10.3)
lymph#: 1.4 10e3/uL (ref 0.9–3.3)

## 2016-10-20 LAB — COMPREHENSIVE METABOLIC PANEL WITH GFR
ALT: 27 U/L (ref 0–55)
AST: 16 U/L (ref 5–34)
Albumin: 3.7 g/dL (ref 3.5–5.0)
Alkaline Phosphatase: 80 U/L (ref 40–150)
Anion Gap: 8 meq/L (ref 3–11)
BUN: 18.1 mg/dL (ref 7.0–26.0)
CO2: 26 meq/L (ref 22–29)
Calcium: 9 mg/dL (ref 8.4–10.4)
Chloride: 105 meq/L (ref 98–109)
Creatinine: 1.3 mg/dL (ref 0.7–1.3)
EGFR: >60 ml/min/1.73 m2
Glucose: 155 mg/dL — ABNORMAL HIGH (ref 70–140)
Potassium: 3.7 meq/L (ref 3.5–5.1)
Sodium: 139 meq/L (ref 136–145)
Total Bilirubin: 0.33 mg/dL (ref 0.20–1.20)
Total Protein: 7.1 g/dL (ref 6.4–8.3)

## 2016-10-20 NOTE — Progress Notes (Signed)
[name]  HEMATOLOGY ONCOLOGY PROGRESS NOTE  Date of service: 10/20/16   Patient Care Team: [name], MD as PCP - General (Family Medicine)  CC: f/u for multiple myeloma and toxicity check for Revlimid.  Diagnosis:  IgG Kappa multiple myeloma   Current Treatment:  On Revlimid (renally adjusted) + Dexamethasone  Previous Treatment s/p CyBorD x 2 cycles  INTERVAL HISTORY:   Douglas Rose. is here for a follow up his current multiple myeloma treatment. He has nearly finished his Cycle of Revlimid + Dexamethasone and notes that he has not new concerns. No fevers/chills/night sweats/weight loss. Eating well and trying to stay active. Labs stable.  REVIEW OF SYSTEMS:    10 Point review of systems of done and is negative except as noted above.  . Past Medical History:  Diagnosis Date  . CAD (coronary artery disease) 2006   stent LAD 3.0x18 [place], occluded RCA  . Cancer ([place])    multiple myeloma  . Diabetes mellitus without complication ([place])   . H/O non-insulin dependent diabetes mellitus   . Hyperlipidemia   . Hypertension     . Past Surgical History:  Procedure Laterality Date  . CARDIAC CATHETERIZATION  2006  . IR FLUORO GUIDE CV LINE RIGHT  05/20/2016  . IR US GUIDE VASC ACCESS RIGHT  05/20/2016    . Social History  Substance Use Topics  . Smoking status: Never Smoker  . Smokeless tobacco: Never Used  . Alcohol use No    ALLERGIES:  has No Known Allergies.  MEDICATIONS:  Current Outpatient Prescriptions  Medication Sig Dispense Refill  . acyclovir (ZOVIRAX) 200 MG capsule Take 1 capsule (200 mg total) by mouth 2 (two) times daily. (Patient taking differently: Take 200 mg by mouth daily. ) 30 capsule 3  . amLODipine (NORVASC) 10 MG tablet Take 10 mg by mouth daily.    [name] aspirin 81 MG tablet Take 81 mg by mouth daily.    [name] atorvastatin (LIPITOR) 20 MG tablet Take 1 tablet (20 mg total) by mouth daily. 90 tablet 3  . chlorhexidine (PERIDEX) 0.12 %  solution Use as directed 15 mLs in the mouth or throat 2 (two) times daily. (Patient taking differently: Use as directed 15 mLs in the mouth or throat as needed. ) 473 mL 0  . dexamethasone (DECADRON) 4 MG tablet Take 5 tablets (20 mg total) by mouth once a week. 20 tablet 2  . gabapentin (NEURONTIN) 300 MG capsule Take 300 mg by mouth daily after supper.    . Insulin Glargine (LANTUS SOLOSTAR) 100 UNIT/ML Solostar Pen Inject 20 Units into the skin daily. (Patient taking differently: Inject 12 Units into the skin daily. ) 15 mL 11  . lenalidomide (REVLIMID) 10 MG capsule Take 1 capsule (10 mg total) by mouth daily. Take for 21 days then 7 days off. Take with water. Auth# [id number] 21 capsule 0  . metoprolol tartrate (LOPRESSOR) 100 MG tablet Take 100 mg by mouth once.    . nitroGLYCERIN (NITROSTAT) 0.4 MG SL tablet Place 1 tablet (0.4 mg total) under the tongue every 5 (five) minutes x 3 doses as needed for chest pain. 30 tablet 12  . ondansetron (ZOFRAN) 8 MG tablet Take 1 tablet (8 mg total) by mouth 2 (two) times daily as needed for refractory nausea / vomiting. 30 tablet 1  . saccharomyces boulardii (FLORASTOR) 250 MG capsule Take 1 capsule (250 mg total) by mouth 2 (two) times daily. (Patient not taking: Reported on 10/07/2016) 60  capsule 0  . vitamin B-12 (CYANOCOBALAMIN) 1000 MCG tablet Take 1,000 mcg by mouth daily.     No current facility-administered medications for this visit.     PHYSICAL EXAMINATION:  ECOG PERFORMANCE STATUS: 1 - Symptomatic but completely ambulatory  . Vitals:   10/20/16 1309  BP: (!) 147/77  Pulse: 67  Resp: 17  Temp: 98 F (36.7 C)  SpO2: 100%    Filed Weights   10/20/16 1309  Weight: 154 lb 8 oz (70.1 kg)   .Body mass index is 21.55 kg/m. NAD GENERAL:alert, in no acute distress and comfortable SKIN: no acute rashes, no significant lesions EYES: conjunctiva are pink and non-injected, sclera anicteric OROPHARYNX: MMM, no exudates, no oropharyngeal  erythema or ulceration NECK: supple, no JVD LYMPH:  no palpable lymphadenopathy in the cervical, axillary or inguinal regions LUNGS: clear to auscultation b/l with normal respiratory effort HEART: regular rate & rhythm ABDOMEN:  normoactive bowel sounds , non tender, not distended. Extremity: no pedal edema PSYCH: alert & oriented x 3 with fluent speech NEURO: no focal motor/sensory deficits  LABORATORY DATA:   I have reviewed the data as listed  . CBC Latest Ref Rng & Units 10/20/2016 09/26/2016 09/02/2016  WBC 4.0 - 10.3 10e3/uL 5.5 2.8(L) 3.0(L)  Hemoglobin 13.0 - 17.1 g/dL 11.4(L) 11.3(L) 10.2(L)  Hematocrit 38.4 - 49.9 % 34.9(L) 35.3(L) 32.1(L)  Platelets 140 - 400 10e3/uL 184 184 198    . CMP Latest Ref Rng & Units 10/20/2016 10/20/2016 09/26/2016  Glucose 70 - 140 mg/dl 155(H) - 148(H)  BUN 7.0 - 26.0 mg/dL 18.1 - 25.4  Creatinine 0.7 - 1.3 mg/dL 1.3 - 1.6(H)  Sodium 136 - 145 mEq/L 139 - 141  Potassium 3.5 - 5.1 mEq/L 3.7 - 4.0  Chloride 101 - 111 mmol/L - - -  CO2 22 - 29 mEq/L 26 - 24  Calcium 8.4 - 10.4 mg/dL 9.0 - 9.5  Total Protein 6.0 - 8.5 g/dL 7.1 6.7 7.8  Total Bilirubin 0.20 - 1.20 mg/dL 0.33 - 0.22  Alkaline Phos 40 - 150 U/L 80 - 77  AST 5 - 34 U/L 16 - 18  ALT 0 - 55 U/L 27 - 14           RADIOGRAPHIC STUDIES: I have personally reviewed the radiological images as listed and agreed with the findings in the report. No results found.  ASSESSMENT & PLAN:  71 yo with multiple medical comorbidities but fairly good performance status overall with   1) ISS Stage II IgG Kapppa multiple myeloma. Anemia + Renal insufficiency. Bone survey neg for concerning bone lesions. significantly elevated kappafree light chains 858 mg percent, lambda 18.4, with a ratio of 46.65 . He was also noted to have an M spike of 1.7 g/dL with IFE showing IgG kappa monoclonal protein. Quantitative IgG level was increased to nearly 2500 mg/dL. 24-hour UPEP showed monoclonal  protein of 1300 mg per 24 hours which constituted about 86% of all the urinary protein. 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 Had an element of obstructive uropathy due to BPH which has resolved. Creatinine continues to improve from 3.5 to 2.9 to 1.9 to 1.6 and has remained stable around 1.5-1.6. Better at 1.3 today 3) Normocytic anemia likely related to multiple myeloma.-stable, improving Plan labs today showed improving anemia and stable renal function -His myeloma labs from 10/20/2016  show no M spike and kappa Lambda serum free light chain ratios have normalized. -Continue to treat him with really adjusted Revlimid at 10 mg by mouth daily for 21 days with 7 days off + dexamethasone 20 mg by mouth weekly . -if renal function improves might need to bump up the dose of Revlimid -he will be on aspirin for DVT CAD and CVA prophylaxis -Continue acyclovir for at least 6 months after last exposure to Velcade. -Continue follow-up with nephrology- patient is following with Dr Ila Mcgill. -After 2-3 cycles of Revlimid plus dexamethasone if the serological parameters suggests complete response we will repeat bone marrow and PET scan to confirm CR status and at that point we'll have to determine maintenance versus referral to transplant based on patient's preference.  5) Poor dentition with right mandibular dental abscess . Patient is completed course of clindamycin and has had dental extractions with resolution of dental issues at this time   6) DM2 - poorly controlled and labile. He notes improved control. - continue management as per primary care physician   Labs in 2 weeks Labs in 5 weeks RTC with Dr Irene Limbo in 6weeks  I spent 20 minutes counseling the patient face to face. The total time spent in the appointment was 25 minutes  and more than 50% was on counseling and direct patient cares.    Sullivan Lone MD [place] AAHIVMS [facility] Hematology/Oncology Physician [facility]  (Office):       [phone] (Work cell):  [phone] (Fax):           [phone]  This document serves as a record of services personally performed by Sullivan Lone, MD. It was created on her behalf by Joslyn Devon, a trained medical scribe. The creation of this record is based on the scribe's personal observations and the provider's statements to them. This document has been checked and approved by the attending provider.

## 2016-10-20 NOTE — Telephone Encounter (Signed)
Scheduled appt per 10/15 los - Gave patient AVS and calender per los.  

## 2016-10-21 LAB — KAPPA/LAMBDA LIGHT CHAINS
Ig Kappa Free Light Chain: 30.7 mg/L — ABNORMAL HIGH (ref 3.3–19.4)
Ig Lambda Free Light Chain: 19.9 mg/L (ref 5.7–26.3)
Kappa/Lambda FluidC Ratio: 1.54 (ref 0.26–1.65)

## 2016-10-23 ENCOUNTER — Other Ambulatory Visit: Payer: Medicare HMO

## 2016-10-23 ENCOUNTER — Ambulatory Visit: Payer: Medicare HMO | Admitting: Hematology

## 2016-10-24 LAB — MULTIPLE MYELOMA PANEL, SERUM
ALBUMIN SERPL ELPH-MCNC: 3.5 g/dL (ref 2.9–4.4)
Albumin/Glob SerPl: 1.1 (ref 0.7–1.7)
Alpha 1: 0.2 g/dL (ref 0.0–0.4)
Alpha2 Glob SerPl Elph-Mcnc: 0.8 g/dL (ref 0.4–1.0)
B-Globulin SerPl Elph-Mcnc: 0.9 g/dL (ref 0.7–1.3)
GAMMA GLOB SERPL ELPH-MCNC: 1.4 g/dL (ref 0.4–1.8)
GLOBULIN, TOTAL: 3.2 g/dL (ref 2.2–3.9)
IGA/IMMUNOGLOBULIN A, SERUM: 130 mg/dL (ref 61–437)
IGM (IMMUNOGLOBIN M), SRM: 53 mg/dL (ref 15–143)
IgG, Qn, Serum: 1348 mg/dL (ref 700–1600)
Total Protein: 6.7 g/dL (ref 6.0–8.5)

## 2016-10-27 ENCOUNTER — Other Ambulatory Visit: Payer: Self-pay

## 2016-10-27 DIAGNOSIS — C9000 Multiple myeloma not having achieved remission: Secondary | ICD-10-CM

## 2016-10-27 MED ORDER — LENALIDOMIDE 10 MG PO CAPS
10.0000 mg | ORAL_CAPSULE | Freq: Every day | ORAL | 0 refills | Status: DC
Start: 2016-10-27 — End: 2016-11-25

## 2016-11-04 ENCOUNTER — Other Ambulatory Visit (HOSPITAL_BASED_OUTPATIENT_CLINIC_OR_DEPARTMENT_OTHER): Payer: Medicare HMO

## 2016-11-04 DIAGNOSIS — C9000 Multiple myeloma not having achieved remission: Secondary | ICD-10-CM

## 2016-11-04 LAB — COMPREHENSIVE METABOLIC PANEL
ALBUMIN: 3.7 g/dL (ref 3.5–5.0)
ALK PHOS: 88 U/L (ref 40–150)
ALT: 41 U/L (ref 0–55)
AST: 17 U/L (ref 5–34)
Anion Gap: 10 mEq/L (ref 3–11)
BILIRUBIN TOTAL: 0.29 mg/dL (ref 0.20–1.20)
BUN: 29.1 mg/dL — AB (ref 7.0–26.0)
CO2: 21 mEq/L — ABNORMAL LOW (ref 22–29)
Calcium: 9.6 mg/dL (ref 8.4–10.4)
Chloride: 104 mEq/L (ref 98–109)
Creatinine: 1.6 mg/dL — ABNORMAL HIGH (ref 0.7–1.3)
EGFR: 51 mL/min/{1.73_m2} — ABNORMAL LOW (ref >60)
GLUCOSE: 333 mg/dL — AB (ref 70–140)
Potassium: 4.1 mEq/L (ref 3.5–5.1)
SODIUM: 135 meq/L — AB (ref 136–145)
TOTAL PROTEIN: 7.7 g/dL (ref 6.4–8.3)

## 2016-11-04 LAB — CBC & DIFF AND RETIC
BASO%: 0.1 % (ref 0.0–2.0)
Basophils Absolute: 0 10*3/uL (ref 0.0–0.1)
EOS%: 0 % (ref 0.0–7.0)
Eosinophils Absolute: 0 10*3/uL (ref 0.0–0.5)
HCT: 34.1 % — ABNORMAL LOW (ref 38.4–49.9)
HEMOGLOBIN: 11.3 g/dL — AB (ref 13.0–17.1)
IMMATURE RETIC FRACT: 2 % — AB (ref 3.00–10.60)
LYMPH%: 10.7 % — AB (ref 14.0–49.0)
MCH: 28.2 pg (ref 27.2–33.4)
MCHC: 33.1 g/dL (ref 32.0–36.0)
MCV: 85 fL (ref 79.3–98.0)
MONO#: 0.7 10*3/uL (ref 0.1–0.9)
MONO%: 7 % (ref 0.0–14.0)
NEUT%: 82.2 % — ABNORMAL HIGH (ref 39.0–75.0)
NEUTROS ABS: 7.8 10*3/uL — AB (ref 1.5–6.5)
Platelets: 313 10*3/uL (ref 140–400)
RBC: 4.01 10*6/uL — ABNORMAL LOW (ref 4.20–5.82)
RDW: 15.4 % — AB (ref 11.0–14.6)
RETIC %: 1.23 % (ref 0.80–1.80)
Retic Ct Abs: 49.32 10*3/uL (ref 34.80–93.90)
WBC: 9.5 10*3/uL (ref 4.0–10.3)
lymph#: 1 10*3/uL (ref 0.9–3.3)

## 2016-11-10 ENCOUNTER — Telehealth: Payer: Self-pay

## 2016-11-10 NOTE — Telephone Encounter (Signed)
Wife called for clarification on dexamethasone. Asking if she remembers correctly that he is to take it on every Monday. Called back and LVM that this is correct.

## 2016-11-25 ENCOUNTER — Other Ambulatory Visit: Payer: Medicare HMO

## 2016-11-25 ENCOUNTER — Other Ambulatory Visit: Payer: Self-pay

## 2016-11-25 ENCOUNTER — Other Ambulatory Visit (HOSPITAL_BASED_OUTPATIENT_CLINIC_OR_DEPARTMENT_OTHER): Payer: Medicare HMO

## 2016-11-25 DIAGNOSIS — C9000 Multiple myeloma not having achieved remission: Secondary | ICD-10-CM

## 2016-11-25 LAB — COMPREHENSIVE METABOLIC PANEL
ALBUMIN: 3.4 g/dL — AB (ref 3.5–5.0)
ALK PHOS: 80 U/L (ref 40–150)
ALT: 20 U/L (ref 0–55)
ANION GAP: 8 meq/L (ref 3–11)
AST: 13 U/L (ref 5–34)
BUN: 22.4 mg/dL (ref 7.0–26.0)
CALCIUM: 8.9 mg/dL (ref 8.4–10.4)
CO2: 27 mEq/L (ref 22–29)
CREATININE: 1.5 mg/dL — AB (ref 0.7–1.3)
Chloride: 106 mEq/L (ref 98–109)
EGFR: 54 mL/min/{1.73_m2} — ABNORMAL LOW (ref >60)
Glucose: 151 mg/dl — ABNORMAL HIGH (ref 70–140)
POTASSIUM: 3.9 meq/L (ref 3.5–5.1)
Sodium: 141 mEq/L (ref 136–145)
Total Bilirubin: 0.27 mg/dL (ref 0.20–1.20)
Total Protein: 6.7 g/dL (ref 6.4–8.3)

## 2016-11-25 LAB — CBC & DIFF AND RETIC
BASO%: 0.2 % (ref 0.0–2.0)
Basophils Absolute: 0 10*3/uL (ref 0.0–0.1)
EOS%: 14.1 % — ABNORMAL HIGH (ref 0.0–7.0)
Eosinophils Absolute: 0.6 10*3/uL — ABNORMAL HIGH (ref 0.0–0.5)
HCT: 32.2 % — ABNORMAL LOW (ref 38.4–49.9)
HGB: 10.6 g/dL — ABNORMAL LOW (ref 13.0–17.1)
Immature Retic Fract: 1.7 % — ABNORMAL LOW (ref 3.00–10.60)
LYMPH%: 25.1 % (ref 14.0–49.0)
MCH: 28.4 pg (ref 27.2–33.4)
MCHC: 32.9 g/dL (ref 32.0–36.0)
MCV: 86.3 fL (ref 79.3–98.0)
MONO#: 0.6 10*3/uL (ref 0.1–0.9)
MONO%: 14.8 % — AB (ref 0.0–14.0)
NEUT%: 45.8 % (ref 39.0–75.0)
NEUTROS ABS: 1.9 10*3/uL (ref 1.5–6.5)
Platelets: 180 10*3/uL (ref 140–400)
RBC: 3.73 10*6/uL — AB (ref 4.20–5.82)
RDW: 15.7 % — ABNORMAL HIGH (ref 11.0–14.6)
Retic %: 1.2 % (ref 0.80–1.80)
Retic Ct Abs: 44.76 10*3/uL (ref 34.80–93.90)
WBC: 4.1 10*3/uL (ref 4.0–10.3)
lymph#: 1 10*3/uL (ref 0.9–3.3)

## 2016-11-25 MED ORDER — LENALIDOMIDE 10 MG PO CAPS
10.0000 mg | ORAL_CAPSULE | Freq: Every day | ORAL | 0 refills | Status: DC
Start: 2016-11-25 — End: 2016-12-22

## 2016-11-26 LAB — KAPPA/LAMBDA LIGHT CHAINS
Ig Kappa Free Light Chain: 36 mg/L — ABNORMAL HIGH (ref 3.3–19.4)
Ig Lambda Free Light Chain: 18.9 mg/L (ref 5.7–26.3)
KAPPA/LAMBDA FLC RATIO: 1.9 — AB (ref 0.26–1.65)

## 2016-11-28 LAB — MULTIPLE MYELOMA PANEL, SERUM
ALBUMIN SERPL ELPH-MCNC: 3.5 g/dL (ref 2.9–4.4)
ALBUMIN/GLOB SERPL: 1.3 (ref 0.7–1.7)
ALPHA 1: 0.2 g/dL (ref 0.0–0.4)
ALPHA2 GLOB SERPL ELPH-MCNC: 0.7 g/dL (ref 0.4–1.0)
B-GLOBULIN SERPL ELPH-MCNC: 0.8 g/dL (ref 0.7–1.3)
GAMMA GLOB SERPL ELPH-MCNC: 1 g/dL (ref 0.4–1.8)
GLOBULIN, TOTAL: 2.8 g/dL (ref 2.2–3.9)
IgA, Qn, Serum: 131 mg/dL (ref 61–437)
IgM, Qn, Serum: 44 mg/dL (ref 15–143)
Total Protein: 6.3 g/dL (ref 6.0–8.5)

## 2016-12-01 NOTE — Progress Notes (Signed)
Douglas Rose  HEMATOLOGY ONCOLOGY PROGRESS NOTE  Date of service: .04/29/2016  Patient Care Team: [name], MD as PCP - General (Family Medicine)  CC: f/u for multiple myeloma  Diagnosis:  IgG Kappa multiple myeloma   Current Treatment:  Rd  INTERVAL HISTORY:  Patient and his wife are here for follow-up prior to his third cycle of treatment for his multiple myeloma. His HGB was 10.6 on 11/25/2016.  He states he is doing well overall and has no acute new symptoms. He had a good thanksgiving. We discussed next steps in his management and the consideration for Auto HSCT. He is agreeable to get a referral for consideration of HSCT at [facility]:    10 Point review of systems of done and is negative except as noted above.  . Past Medical History:  Diagnosis Date  . CAD (coronary artery disease) 2006   stent LAD 3.0x18 [place], occluded RCA  . Cancer ([place])    multiple myeloma  . Diabetes mellitus without complication ([place])   . H/O non-insulin dependent diabetes mellitus   . Hyperlipidemia   . Hypertension     . Past Surgical History:  Procedure Laterality Date  . CARDIAC CATHETERIZATION  2006  . IR FLUORO GUIDE CV LINE RIGHT  05/20/2016  . IR US GUIDE VASC ACCESS RIGHT  05/20/2016    . Social History   Tobacco Use  . Smoking status: Never Smoker  . Smokeless tobacco: Never Used  Substance Use Topics  . Alcohol use: No  . Drug use: No    ALLERGIES:  has No Known Allergies.  MEDICATIONS:  Current Outpatient Medications  Medication Sig Dispense Refill  . acyclovir (ZOVIRAX) 200 MG capsule Take 1 capsule (200 mg total) by mouth 2 (two) times daily. (Patient taking differently: Take 200 mg by mouth daily. ) 30 capsule 3  . amLODipine (NORVASC) 10 MG tablet Take 10 mg by mouth daily.    Douglas Rose aspirin 81 MG tablet Take 81 mg by mouth daily.    Douglas Rose atorvastatin (LIPITOR) 20 MG tablet Take 1 tablet (20 mg total) by mouth daily. 90 tablet 3  .  chlorhexidine (PERIDEX) 0.12 % solution Use as directed 15 mLs in the mouth or throat 2 (two) times daily. (Patient taking differently: Use as directed 15 mLs in the mouth or throat as needed. ) 473 mL 0  . dexamethasone (DECADRON) 4 MG tablet Take 5 tablets (20 mg total) by mouth once a week. 20 tablet 2  . gabapentin (NEURONTIN) 300 MG capsule Take 300 mg by mouth daily after supper.    . Insulin Glargine (LANTUS SOLOSTAR) 100 UNIT/ML Solostar Pen Inject 20 Units into the skin daily. (Patient taking differently: Inject 12 Units into the skin daily. ) 15 mL 11  . lenalidomide (REVLIMID) 10 MG capsule Take 1 capsule (10 mg total) by mouth daily. Take for 21 days then 7 days off. Take with water. Auth# [id number] Nov 25 2016 21 capsule 0  . metoprolol tartrate (LOPRESSOR) 100 MG tablet Take 100 mg by mouth once.    . nitroGLYCERIN (NITROSTAT) 0.4 MG SL tablet Place 1 tablet (0.4 mg total) under the tongue every 5 (five) minutes x 3 doses as needed for chest pain. 30 tablet 12  . ondansetron (ZOFRAN) 8 MG tablet Take 1 tablet (8 mg total) by mouth 2 (two) times daily as needed for refractory nausea / vomiting. 30 tablet 1  . saccharomyces boulardii (FLORASTOR) 250 MG  capsule Take 1 capsule (250 mg total) by mouth 2 (two) times daily. (Patient not taking: Reported on 10/07/2016) 60 capsule 0  . vitamin B-12 (CYANOCOBALAMIN) 1000 MCG tablet Take 1,000 mcg by mouth daily.     No current facility-administered medications for this visit.     PHYSICAL EXAMINATION: ECOG PERFORMANCE STATUS: 1 - Symptomatic but completely ambulatory  . Vitals:   12/02/16 1320  BP: (!) 156/78  Pulse: 71  Resp: 18  Temp: 97.8 F (36.6 C)  SpO2: 98%    Filed Weights   12/02/16 1320  Weight: 161 lb 1.6 oz (73.1 kg)   .Body mass index is 22.47 kg/m.  GENERAL:alert, in no acute distress and comfortable SKIN: no acute rashes, no significant lesions EYES: conjunctiva are pink and non-injected, sclera  anicteric OROPHARYNX: MMM, no exudates, no oropharyngeal erythema or ulceration NECK: supple, no JVD LYMPH:  no palpable lymphadenopathy in the cervical, axillary or inguinal regions LUNGS: clear to auscultation b/l with normal respiratory effort HEART: regular rate & rhythm ABDOMEN:  normoactive bowel sounds , non tender, not distended. Extremity: no pedal edema PSYCH: alert & oriented x 3 with fluent speech NEURO: no focal motor/sensory deficits  LABORATORY DATA:   I have reviewed the data as listed  . CBC Latest Ref Rng & Units 11/25/2016 11/04/2016 10/20/2016  WBC 4.0 - 10.3 10e3/uL 4.1 9.5 5.5  Hemoglobin 13.0 - 17.1 g/dL 10.6(L) 11.3(L) 11.4(L)  Hematocrit 38.4 - 49.9 % 32.2(L) 34.1(L) 34.9(L)  Platelets 140 - 400 10e3/uL 180 313 184    . CMP Latest Ref Rng & Units 11/25/2016 11/25/2016 11/04/2016  Glucose 70 - 140 mg/dl 151(H) - 333(H)  BUN 7.0 - 26.0 mg/dL 22.4 - 29.1(H)  Creatinine 0.7 - 1.3 mg/dL 1.5(H) - 1.6(H)  Sodium 136 - 145 mEq/L 141 - 135(L)  Potassium 3.5 - 5.1 mEq/L 3.9 - 4.1  Chloride 101 - 111 mmol/L - - -  CO2 22 - 29 mEq/L 27 - 21(L)  Calcium 8.4 - 10.4 mg/dL 8.9 - 9.6  Total Protein 6.0 - 8.5 g/dL 6.7 6.3 7.7  Total Bilirubin 0.20 - 1.20 mg/dL 0.27 - 0.29  Alkaline Phos 40 - 150 U/L 80 - 88  AST 5 - 34 U/L 13 - 17  ALT 0 - 55 U/L 20 - 41           RADIOGRAPHIC STUDIES: I have personally reviewed the radiological images as listed and agreed with the findings in the report. No results found.  ASSESSMENT & PLAN:   71 yo with multiple medical comorbidities but fairly good performance status overall with   1) ISS Stage II IgG Kapppa multiple myeloma. Anemia + Renal insufficiency. Bone survey neg for concerning bone lesions. significantly elevated kappafree light chains 858 mg, lambda 18.4, with a ratio of 46.65 . He was also noted to have an M spike of 1.7 g/dL with IFE showing IgG kappa monoclonal protein. Quantitative IgG level was  increased to nearly 2500 mg/dL. 24-hour UPEP showed monoclonal protein of 1300 mg per 24 hours which constituted about 86% of all the urinary protein. Bone marrow biopsy showed 20-30% Restricted plasma cells consistent with multiple myeloma Cytogenetics/FISH  Showed   Patient has been treated with CyBord X 2 cycles . Treatment interrupted due to severe c diff colitis with prolonged hospitalization and decline in performance status.  Patient is much improved and back to baseline now. M protein undetected with neg IFE Has been treated with 2 cycles of Rd.  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 Had an element of obstructive uropathy due to BPH which has resolved. Creatinine continues to improve from 3.5 to 2.9 to 1.9 to 1.6 3) Normocytic anemia likely related to multiple myeloma and Revlimid.-stable, improving Plan -Patient has no prohibitive toxicities from his Rd -labs stable. -proceed with 3rd cycle of dose adjusted Revlimid + Dexamethasone - given referral to Dr Norma Fredrickson at [facility] for consideration of Auto HSCT. -further rx based on transplant decision.  5) DM2 - better controled -continue f/u with PCP.   RTC with Dr Irene Limbo in 4 weeks  Labs 1 week prior to clinic visit  I spent 20 minutes counseling the patient face to face. The total time spent in the appointment was 25 minutes and more than 50% was on counseling and direct patient cares.    Sullivan Lone MD [place] AAHIVMS [facility] Hematology/Oncology Physician [facility]  (Office):       [phone] (Work cell):  [phone] (Fax):           [phone]  This document serves as a record of services personally performed by Sullivan Lone, MD. It was created on his behalf by Alean Rinne, a trained medical scribe. The creation of this record is based on the scribe's personal observations and the provider's statements to them.   .I have reviewed the above documentation  for accuracy and completeness, and I agree with the above. Brunetta Genera MD MS

## 2016-12-02 ENCOUNTER — Encounter: Payer: Self-pay | Admitting: Hematology

## 2016-12-02 ENCOUNTER — Ambulatory Visit (HOSPITAL_BASED_OUTPATIENT_CLINIC_OR_DEPARTMENT_OTHER): Payer: Medicare HMO | Admitting: Hematology

## 2016-12-02 ENCOUNTER — Ambulatory Visit: Payer: Medicare HMO | Admitting: Hematology

## 2016-12-02 ENCOUNTER — Telehealth: Payer: Self-pay | Admitting: Hematology

## 2016-12-02 ENCOUNTER — Telehealth: Payer: Self-pay | Admitting: *Deleted

## 2016-12-02 VITALS — BP 156/78 | HR 71 | Temp 97.8°F | Resp 18 | Wt 161.1 lb

## 2016-12-02 DIAGNOSIS — D649 Anemia, unspecified: Secondary | ICD-10-CM | POA: Diagnosis not present

## 2016-12-02 DIAGNOSIS — C9000 Multiple myeloma not having achieved remission: Secondary | ICD-10-CM

## 2016-12-02 DIAGNOSIS — E119 Type 2 diabetes mellitus without complications: Secondary | ICD-10-CM | POA: Diagnosis not present

## 2016-12-02 NOTE — Telephone Encounter (Signed)
Per [name], referral made to Dr. Norma Fredrickson at [facility] for consideration of stem cell transplant for myeloma.  [place] in referrals, information will be passed onto nurse navigator.  Nurse navigator will return call later this week when apt has been made/approved.  Requested information faxed to [phone].

## 2016-12-02 NOTE — Telephone Encounter (Signed)
Scheduled appt per 11/27 los - Gave patient AVS and calender per los.  

## 2016-12-02 NOTE — Patient Instructions (Signed)
Thank you for choosing [facility] to provide your oncology and hematology care.  To afford each patient quality time with our providers, please arrive 30 minutes before your scheduled appointment time.  If you arrive late for your appointment, you may be asked to reschedule.  We strive to give you quality time with our providers, and arriving late affects you and other patients whose appointments are after yours.   If you are a no show for multiple scheduled visits, you may be dismissed from the clinic at the providers discretion.    Again, thank you for choosing [facility], our hope is that these requests will decrease the amount of time that you wait before being seen by our physicians.  ______________________________________________________________________  Should you have questions after your visit to the [facility], please contact our office at [phone] between the hours of 8:30 and 4:30 p.m.    Voicemails left after 4:30p.m will not be returned until the following business day.    For prescription refill requests, please have your pharmacy contact us directly.  Please also try to allow 48 hours for prescription requests.    Please contact the scheduling department for questions regarding scheduling.  For scheduling of procedures such as PET scans, CT scans, MRI, Ultrasound, etc please contact central scheduling at [phone].    Resources For Cancer Patients and Caregivers:   Oncolink.org:  A wonderful resource for patients and healthcare providers for information regarding your disease, ways to tract your treatment, what to expect, etc.     American Cancer Society:  800-227-2345  Can help patients locate various types of support and financial assistance  Cancer Care: 1-800-813-HOPE (4673) Provides financial assistance, online support groups, medication/co-pay assistance.    [place] DSS:  336-641-3447 Where to apply for food  stamps, Medicaid, and utility assistance  Medicare Rights Center: 800-333-4114 Helps people with Medicare understand their rights and benefits, navigate the Medicare system, and secure the quality healthcare they deserve  SCAT: 336-333-6589 [place] [employer]'s shared-ride transportation service for eligible riders who have a disability that prevents them from riding the fixed route bus.    For additional information on assistance programs please contact our social worker:   Grier Hock/Abigail Elmore:  [phone]            

## 2016-12-09 ENCOUNTER — Other Ambulatory Visit: Payer: Medicare HMO

## 2016-12-11 ENCOUNTER — Telehealth: Payer: Self-pay

## 2016-12-11 NOTE — Telephone Encounter (Signed)
Spoke with referral personnel at [facility] [phone]. Pt demographics, last 2 OV notes, lab results and pathology faxed to [phone]. Confirmed fax receipt 12/11/16 at 1319.

## 2016-12-12 ENCOUNTER — Telehealth: Payer: Self-pay

## 2016-12-12 NOTE — Telephone Encounter (Signed)
Left VM for patient stating that referral paperwork sent to [facility] yesterday. Patient should expect call from [facility] when physician and appointment date/time has been approved. If pt has not heard anything by Tuesday, instructed to call office and inquire.

## 2016-12-16 ENCOUNTER — Ambulatory Visit: Payer: Medicare HMO | Admitting: Hematology

## 2016-12-17 DIAGNOSIS — R1084 Generalized abdominal pain: Secondary | ICD-10-CM

## 2016-12-17 DIAGNOSIS — D72829 Elevated white blood cell count, unspecified: Secondary | ICD-10-CM

## 2016-12-17 DIAGNOSIS — Z7189 Other specified counseling: Secondary | ICD-10-CM

## 2016-12-22 ENCOUNTER — Other Ambulatory Visit: Payer: Self-pay

## 2016-12-22 DIAGNOSIS — C9000 Multiple myeloma not having achieved remission: Secondary | ICD-10-CM

## 2016-12-22 MED ORDER — LENALIDOMIDE 10 MG PO CAPS: 10.0000 mg | ORAL_CAPSULE | Freq: Every day | ORAL | 0 refills | Status: DC

## 2016-12-23 ENCOUNTER — Other Ambulatory Visit: Payer: Self-pay

## 2016-12-24 ENCOUNTER — Telehealth: Payer: Self-pay | Admitting: Medical Oncology

## 2016-12-24 NOTE — Telephone Encounter (Signed)
Per Dr Irene Limbo I told pt wife to have pt  start 4th round of revlimid.

## 2016-12-24 NOTE — Telephone Encounter (Signed)
Wife called and asking if pt needs to start 4th "round " of Revlimid ? Saw PCP yesterday who posed the question because Pt has not been seen for stem cell consultation. [facility] is sending med and if he doesn't need it she wants to stop delivery so they don't have to pay for Revlimid.

## 2017-01-02 ENCOUNTER — Telehealth: Payer: Self-pay | Admitting: *Deleted

## 2017-01-02 NOTE — Telephone Encounter (Signed)
Received call from [facility] at [facility] stating Dr. Norma Fredrickson will see pt on 1/7 @1pm  for transplant consultation.

## 2017-01-05 ENCOUNTER — Telehealth: Payer: Self-pay | Admitting: *Deleted

## 2017-01-05 NOTE — Telephone Encounter (Signed)
FYI "I've been trying to reach support staff all morning in reference to yesterday's call.  Need to notify [name] I cannot see Dr. Norma Fredrickson at [facility].  He's out of network.  I've reached out to [facility] who's in network and do transplants.  [facility] called me with an appointment January 28, 2017 at 11:00 am."   Will notify provider.  "I did speak with someone who recommended I call [facility] but I do not have a number."   Provided New Patient appointment number for [facility] Hematology and Oncology, [phone].   No further questions or needs at this time.

## 2017-01-07 ENCOUNTER — Telehealth: Payer: Self-pay

## 2017-01-07 NOTE — Telephone Encounter (Signed)
F/u on call from Monday regarding [facility] new patient appointments. Left VM with pt regarding message received 01/05/17 and message posted by Roz, RN after speaking with the patient on 01/05/17. According to the patient, [facility] is out-of-network. Explained that the patient should be able to f/u at [facility] instead if that works better for the pt regarding insurance. If he does want to cancel the [facility] appt provided same number as Roz, RN to call their office [phone].

## 2017-01-08 ENCOUNTER — Other Ambulatory Visit (HOSPITAL_BASED_OUTPATIENT_CLINIC_OR_DEPARTMENT_OTHER): Payer: Medicare HMO

## 2017-01-08 DIAGNOSIS — C9000 Multiple myeloma not having achieved remission: Secondary | ICD-10-CM | POA: Diagnosis not present

## 2017-01-08 DIAGNOSIS — D649 Anemia, unspecified: Secondary | ICD-10-CM

## 2017-01-08 LAB — CBC & DIFF AND RETIC
BASO%: 0.2 % (ref 0.0–2.0)
BASOS ABS: 0 10*3/uL (ref 0.0–0.1)
EOS%: 8.5 % — AB (ref 0.0–7.0)
Eosinophils Absolute: 0.5 10*3/uL (ref 0.0–0.5)
HEMATOCRIT: 37.7 % — AB (ref 38.4–49.9)
HGB: 12.3 g/dL — ABNORMAL LOW (ref 13.0–17.1)
Immature Retic Fract: 2.2 % — ABNORMAL LOW (ref 3.00–10.60)
LYMPH%: 15.7 % (ref 14.0–49.0)
MCH: 27.9 pg (ref 27.2–33.4)
MCHC: 32.6 g/dL (ref 32.0–36.0)
MCV: 85.5 fL (ref 79.3–98.0)
MONO#: 0.8 10*3/uL (ref 0.1–0.9)
MONO%: 13.4 % (ref 0.0–14.0)
NEUT#: 3.5 10*3/uL (ref 1.5–6.5)
NEUT%: 62.2 % (ref 39.0–75.0)
PLATELETS: 174 10*3/uL (ref 140–400)
RBC: 4.41 10*6/uL (ref 4.20–5.82)
RDW: 15.7 % — AB (ref 11.0–14.6)
RETIC %: 1.26 % (ref 0.80–1.80)
Retic Ct Abs: 55.57 10*3/uL (ref 34.80–93.90)
WBC: 5.7 10*3/uL (ref 4.0–10.3)
lymph#: 0.9 10*3/uL (ref 0.9–3.3)

## 2017-01-08 LAB — COMPREHENSIVE METABOLIC PANEL
ALT: 27 U/L (ref 0–55)
AST: 13 U/L (ref 5–34)
Albumin: 3.7 g/dL (ref 3.5–5.0)
Alkaline Phosphatase: 101 U/L (ref 40–150)
Anion Gap: 8 mEq/L (ref 3–11)
BILIRUBIN TOTAL: 0.35 mg/dL (ref 0.20–1.20)
BUN: 27.4 mg/dL — ABNORMAL HIGH (ref 7.0–26.0)
CALCIUM: 9.1 mg/dL (ref 8.4–10.4)
CO2: 28 meq/L (ref 22–29)
CREATININE: 1.8 mg/dL — AB (ref 0.7–1.3)
Chloride: 102 mEq/L (ref 98–109)
EGFR: 42 mL/min/{1.73_m2} — ABNORMAL LOW (ref >60)
Glucose: 343 mg/dl — ABNORMAL HIGH (ref 70–140)
Potassium: 4 mEq/L (ref 3.5–5.1)
Sodium: 138 mEq/L (ref 136–145)
TOTAL PROTEIN: 7 g/dL (ref 6.4–8.3)

## 2017-01-09 LAB — KAPPA/LAMBDA LIGHT CHAINS
IG KAPPA FREE LIGHT CHAIN: 18.7 mg/L (ref 3.3–19.4)
Ig Lambda Free Light Chain: 17.2 mg/L (ref 5.7–26.3)
Kappa/Lambda FluidC Ratio: 1.09 (ref 0.26–1.65)

## 2017-01-12 LAB — MULTIPLE MYELOMA PANEL, SERUM
ALBUMIN/GLOB SERPL: 1.1 (ref 0.7–1.7)
ALPHA 1: 0.2 g/dL (ref 0.0–0.4)
ALPHA2 GLOB SERPL ELPH-MCNC: 0.9 g/dL (ref 0.4–1.0)
Albumin SerPl Elph-Mcnc: 3.2 g/dL (ref 2.9–4.4)
B-GLOBULIN SERPL ELPH-MCNC: 1 g/dL (ref 0.7–1.3)
Gamma Glob SerPl Elph-Mcnc: 1.1 g/dL (ref 0.4–1.8)
Globulin, Total: 3.2 g/dL (ref 2.2–3.9)
IgA, Qn, Serum: 148 mg/dL (ref 61–437)
IgM, Qn, Serum: 41 mg/dL (ref 15–143)
TOTAL PROTEIN: 6.4 g/dL (ref 6.0–8.5)

## 2017-01-15 ENCOUNTER — Encounter: Payer: Self-pay | Admitting: Hematology

## 2017-01-15 ENCOUNTER — Inpatient Hospital Stay: Payer: Medicare HMO | Attending: Hematology | Admitting: Hematology

## 2017-01-15 ENCOUNTER — Telehealth: Payer: Self-pay | Admitting: Hematology

## 2017-01-15 VITALS — BP 168/87 | HR 72 | Temp 98.4°F | Resp 18 | Ht 71.0 in | Wt 164.6 lb

## 2017-01-15 DIAGNOSIS — E119 Type 2 diabetes mellitus without complications: Secondary | ICD-10-CM | POA: Insufficient documentation

## 2017-01-15 DIAGNOSIS — N289 Disorder of kidney and ureter, unspecified: Secondary | ICD-10-CM | POA: Diagnosis not present

## 2017-01-15 DIAGNOSIS — C9000 Multiple myeloma not having achieved remission: Secondary | ICD-10-CM | POA: Diagnosis not present

## 2017-01-15 DIAGNOSIS — D649 Anemia, unspecified: Secondary | ICD-10-CM | POA: Diagnosis not present

## 2017-01-15 NOTE — Telephone Encounter (Signed)
Scheduled appt per 1/10 los - Gave patient avs and calender per los.

## 2017-01-15 NOTE — Progress Notes (Signed)
[facility]  HEMATOLOGY ONCOLOGY PROGRESS NOTE  Date of service: 01/15/17  Patient Care Team: Douglas Mires, MD as PCP - General (Family Medicine)  CC: f/u for multiple myeloma  Diagnosis:  IgG Kappa multiple myeloma   Current Treatment:  Rd  INTERVAL HISTORY:  [name] and his wife are here for follow-up during his fourth cycle of Rd treatment for his multiple myeloma. His recent labs show that he is in serologic remission. He has an appointment with Dr. Maylene [facility] on 01/28/2017 at [facility] to see if he is a candidate for a transplant. He is doing well, overall. Per his wife, she is trying to get him to stop eating past 8 pm because she wants to keep healthy since he has gained some weight. He denies any other symptoms or complaints at this time.   REVIEW OF SYSTEMS:    A 10+ POINT REVIEW OF SYSTEMS WAS OBTAINED including neurology, dermatology, psychiatry, cardiac, respiratory, lymph, extremities, GI, GU, Musculoskeletal, constitutional, breasts, reproductive, HEENT.  All pertinent positives are noted in the HPI.  All others are negative.   Past Medical History:  Diagnosis Date  . CAD (coronary artery disease) 2006   stent LAD 3.0x18 Cypher, occluded RCA  . Cancer ([facility])    multiple myeloma  . Diabetes mellitus without complication ([place])   . H/O non-insulin dependent diabetes mellitus   . Hyperlipidemia   . Hypertension     . Past Surgical History:  Procedure Laterality Date  . CARDIAC CATHETERIZATION  2006  . IR FLUORO GUIDE CV LINE RIGHT  05/20/2016  . IR US GUIDE VASC ACCESS RIGHT  05/20/2016    . Social History   Tobacco Use  . Smoking status: Never Smoker  . Smokeless tobacco: Never Used  Substance Use Topics  . Alcohol use: No  . Drug use: No    ALLERGIES:  has No Known Allergies.  MEDICATIONS:  Current Outpatient Medications  Medication Sig Dispense Refill  . acyclovir (ZOVIRAX) 200 MG capsule Take 1 capsule (200 mg total) by mouth 2 (two) times daily. (Patient  taking differently: Take 200 mg by mouth daily. ) 30 capsule 3  . amLODipine (NORVASC) 10 MG tablet Take 10 mg by mouth daily.    [facility] aspirin 81 MG tablet Take 81 mg by mouth daily.    [facility] atorvastatin (LIPITOR) 20 MG tablet Take 1 tablet (20 mg total) by mouth daily. 90 tablet 3  . gabapentin (NEURONTIN) 300 MG capsule Take 300 mg by mouth daily after supper.    . Insulin Glargine (LANTUS SOLOSTAR) 100 UNIT/ML Solostar Pen Inject 20 Units into the skin daily. (Patient taking differently: Inject 12 Units into the skin daily. ) 15 mL 11  . lenalidomide (REVLIMID) 10 MG capsule Take 1 capsule (10 mg total) by mouth daily. Take for 21 days then 7 days off. Take with water. [id number] 12/22/16 21 capsule 0  . metoprolol tartrate (LOPRESSOR) 100 MG tablet Take 100 mg by mouth once.    . vitamin B-12 (CYANOCOBALAMIN) 1000 MCG tablet Take 1,000 mcg by mouth daily.    . chlorhexidine (PERIDEX) 0.12 % solution Use as directed 15 mLs in the mouth or throat 2 (two) times daily. (Patient not taking: Reported on 01/15/2017) 473 mL 0  . dexamethasone (DECADRON) 4 MG tablet Take 5 tablets (20 mg total) by mouth once a week. (Patient not taking: Reported on 01/15/2017) 20 tablet 2  . nitroGLYCERIN (NITROSTAT) 0.4 MG SL tablet Place 1 tablet (0.4 mg total) under  the tongue every 5 (five) minutes x 3 doses as needed for chest pain. (Patient not taking: Reported on 01/15/2017) 30 tablet 12  . ondansetron (ZOFRAN) 8 MG tablet Take 1 tablet (8 mg total) by mouth 2 (two) times daily as needed for refractory nausea / vomiting. (Patient not taking: Reported on 01/15/2017) 30 tablet 1  . saccharomyces boulardii (FLORASTOR) 250 MG capsule Take 1 capsule (250 mg total) by mouth 2 (two) times daily. (Patient not taking: Reported on 01/15/2017) 60 capsule 0   No current facility-administered medications for this visit.     PHYSICAL EXAMINATION: ECOG PERFORMANCE STATUS: 1 - Symptomatic but completely ambulatory  . Vitals:    01/15/17 1435  BP: (!) 168/87  Pulse: 72  Resp: 18  Temp: 98.4 F (36.9 C)  SpO2: 100%    Filed Weights   01/15/17 1435  Weight: 164 lb 9.6 oz (74.7 kg)   .Body mass index is 22.96 kg/m.   GENERAL:alert, in no acute distress and comfortable SKIN: no acute rashes, no significant lesions EYES: conjunctiva are pink and non-injected, sclera anicteric OROPHARYNX: MMM, no exudates, no oropharyngeal erythema or ulceration NECK: supple, no JVD LYMPH:  no palpable lymphadenopathy in the cervical, axillary or inguinal regions LUNGS: clear to auscultation b/l with normal respiratory effort HEART: regular rate & rhythm ABDOMEN:  normoactive bowel sounds , non tender, not distended. Extremity: no pedal edema PSYCH: alert & oriented x 3 with fluent speech NEURO: no focal motor/sensory deficits  LABORATORY DATA:   I have reviewed the data as listed  . CBC Latest Ref Rng & Units 01/08/2017 11/25/2016 11/04/2016  WBC 4.0 - 10.3 10e3/uL 5.7 4.1 9.5  Hemoglobin 13.0 - 17.1 g/dL 12.3(L) 10.6(L) 11.3(L)  Hematocrit 38.4 - 49.9 % 37.7(L) 32.2(L) 34.1(L)  Platelets 140 - 400 10e3/uL 174 180 313    . CMP Latest Ref Rng & Units 01/08/2017 01/08/2017 11/25/2016  Glucose 70 - 140 mg/dl 343(H) - 151(H)  BUN 7.0 - 26.0 mg/dL 27.4(H) - 22.4  Creatinine 0.7 - 1.3 mg/dL 1.8(H) - 1.5(H)  Sodium 136 - 145 mEq/L 138 - 141  Potassium 3.5 - 5.1 mEq/L 4.0 - 3.9  Chloride 101 - 111 mmol/L - - -  CO2 22 - 29 mEq/L 28 - 27  Calcium 8.4 - 10.4 mg/dL 9.1 - 8.9  Total Protein 6.0 - 8.5 g/dL 7.0 6.4 6.7  Total Bilirubin 0.20 - 1.20 mg/dL 0.35 - 0.27  Alkaline Phos 40 - 150 U/L 101 - 80  AST 5 - 34 U/L 13 - 13  ALT 0 - 55 U/L 27 - 20           RADIOGRAPHIC STUDIES: I have personally reviewed the radiological images as listed and agreed with the findings in the report. No results found.  ASSESSMENT & PLAN:   72 yo with multiple medical comorbidities but fairly good performance status overall with     1) ISS Stage II IgG Kapppa multiple myeloma. Anemia + Renal insufficiency. Bone survey neg for concerning bone lesions. significantly elevated kappafree light chains 858 mg percent, lambda 18.4, with a ratio of 46.65 . He was also noted to have an M spike of 1.7 g/dL with IFE showing IgG kappa monoclonal protein. Quantitative IgG level was increased to nearly 2500 mg/dL. 24-hour UPEP showed monoclonal protein of 1300 mg per 24 hours which constituted about 86% of all the urinary protein. Bone marrow biopsy showed 20-30% Restricted plasma cells consistent with multiple myeloma Cytogenetics/FISH  Showed  Patient has been treated with CyBord X 2 cycles . Treatment interrupted due to severe c diff colitis with prolonged hospitalization and decline in performance status.  Patient is much improved and back to baseline now. M protein undetected with neg IFE Has been treated with 3 cycles of Rd and is completing his 4th cycle of Rd.   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 Had an element of obstructive uropathy due to BPH which has resolved. Creatinine continues to improve from 3.5 to 2.9 to 1.9 to 1.6. Stable 1.6-1.8  3) Normocytic anemia likely related to multiple myeloma.-stable, improving No abdominal pain or discomfort over the right upper quadrant. No fevers or chills. Plan -Patient has no prohibitive toxicities from his Rd treatment thus far - labs today showed improving anemia hgb improved from 10.6 to 12.3., creatinine stable. -Continue follow-up with nephrology- patient is following with Dr Ila Mcgill. -he is getting his 4th cycle of Rd and is in serologic remission. -he has an appointment with Dr [facility] at [facility] to be evaluated for Auto HSCT. On 01/29/2017. Based on that decision will plan for auto HSCT followed by maintenance Revlimid vs maintenance Revlimid alone at this time.  4) 5) DM2 - better controled -continue f/u with  PCP.  RTC with Dr Irene Limbo in 6 weeks with labs  No orders of the defined types were placed in this encounter.  I spent 20 minutes counseling the patient face to face. The total time spent in the appointment was 25 minutes and more than 50% was on counseling and direct patient cares.    Sullivan Lone MD [place] AAHIVMS [place] [facility] [place] [facility] Hematology/Oncology Physician [facility]  (Office):       [phone] (Work cell):  [phone] (Fax):           [phone]  This document serves as a record of services personally performed by Sullivan Lone, MD. It was created on his behalf by Margit Banda, a trained medical scribe. The creation of this record is based on the scribe's personal observations and the provider's statements to them.   .I have reviewed the above documentation for accuracy and completeness, and I agree with the above. Brunetta Genera MD MS

## 2017-01-15 NOTE — Patient Instructions (Signed)
Thank you for choosing [facility] to provide your oncology and hematology care.  To afford each patient quality time with our providers, please arrive 30 minutes before your scheduled appointment time.  If you arrive late for your appointment, you may be asked to reschedule.  We strive to give you quality time with our providers, and arriving late affects you and other patients whose appointments are after yours.   If you are a no show for multiple scheduled visits, you may be dismissed from the clinic at the providers discretion.    Again, thank you for choosing [facility], our hope is that these requests will decrease the amount of time that you wait before being seen by our physicians.  ______________________________________________________________________  Should you have questions after your visit to the  Cancer Center, please contact our office at [phone] between the hours of 8:30 and 4:30 p.m.    Voicemails left after 4:30p.m will not be returned until the following business day.    For prescription refill requests, please have your pharmacy contact us directly.  Please also try to allow 48 hours for prescription requests.    Please contact the scheduling department for questions regarding scheduling.  For scheduling of procedures such as PET scans, CT scans, MRI, Ultrasound, etc please contact central scheduling at [phone].    Resources For Cancer Patients and Caregivers:   Oncolink.org:  A wonderful resource for patients and healthcare providers for information regarding your disease, ways to tract your treatment, what to expect, etc.     American Cancer Society:  800-227-2345  Can help patients locate various types of support and financial assistance  Cancer Care: 1-800-813-HOPE (4673) Provides financial assistance, online support groups, medication/co-pay assistance.    [place] DSS:  336-641-3447 Where to apply for food  stamps, Medicaid, and utility assistance  Medicare Rights Center: 800-333-4114 Helps people with Medicare understand their rights and benefits, navigate the Medicare system, and secure the quality healthcare they deserve  SCAT: 336-333-6589 [place] [employer]'s shared-ride transportation service for eligible riders who have a disability that prevents them from riding the fixed route bus.    For additional information on assistance programs please contact our social worker:   Grier Hock/Abigail Elmore:  [phone]            

## 2017-01-23 ENCOUNTER — Other Ambulatory Visit: Payer: Self-pay

## 2017-01-23 DIAGNOSIS — C9000 Multiple myeloma not having achieved remission: Secondary | ICD-10-CM

## 2017-01-23 MED ORDER — LENALIDOMIDE 10 MG PO CAPS: 10.0000 mg | ORAL_CAPSULE | Freq: Every day | ORAL | 0 refills | Status: DC

## 2017-01-28 DIAGNOSIS — E785 Hyperlipidemia, unspecified: Secondary | ICD-10-CM | POA: Insufficient documentation

## 2017-01-29 ENCOUNTER — Encounter: Payer: Self-pay | Admitting: *Deleted

## 2017-01-29 ENCOUNTER — Other Ambulatory Visit: Payer: Self-pay

## 2017-01-29 ENCOUNTER — Telehealth: Payer: Self-pay | Admitting: *Deleted

## 2017-01-29 NOTE — Telephone Encounter (Signed)
Pt called stating he had consult for BMBX at [facility] yesterday.  Pt was advised he is not a candidate for transplant.  Reviewed with [name], and decided to continue with maintenance revlimid at this time.  Rx sent 1/18 for 10mg  revlimid daily for 21 days.  Ross with [name] at this time.  Pt advised to call pharmacy to fill rx, also advised not to restart dexamethasone per [name].  Pt verbalized understanding.

## 2017-01-30 ENCOUNTER — Other Ambulatory Visit: Payer: Self-pay | Admitting: Cardiology

## 2017-01-30 ENCOUNTER — Ambulatory Visit: Payer: Medicare HMO | Admitting: Cardiology

## 2017-01-30 ENCOUNTER — Encounter: Payer: Self-pay | Admitting: Cardiology

## 2017-01-30 VITALS — BP 152/68 | HR 79 | Ht 71.0 in | Wt 163.0 lb

## 2017-01-30 DIAGNOSIS — E1159 Type 2 diabetes mellitus with other circulatory complications: Secondary | ICD-10-CM | POA: Diagnosis not present

## 2017-01-30 DIAGNOSIS — E782 Mixed hyperlipidemia: Secondary | ICD-10-CM | POA: Diagnosis not present

## 2017-01-30 DIAGNOSIS — Z01818 Encounter for other preprocedural examination: Secondary | ICD-10-CM

## 2017-01-30 DIAGNOSIS — I2510 Atherosclerotic heart disease of native coronary artery without angina pectoris: Secondary | ICD-10-CM

## 2017-01-30 DIAGNOSIS — Z1329 Encounter for screening for other suspected endocrine disorder: Secondary | ICD-10-CM | POA: Diagnosis not present

## 2017-01-30 DIAGNOSIS — I10 Essential (primary) hypertension: Secondary | ICD-10-CM

## 2017-01-30 DIAGNOSIS — C9000 Multiple myeloma not having achieved remission: Secondary | ICD-10-CM

## 2017-01-30 HISTORY — DX: Encounter for other preprocedural examination: Z01.818

## 2017-01-30 NOTE — Patient Instructions (Addendum)
Medication Instructions:  Your physician recommends that you continue on your current medications as directed. Please refer to the Current Medication list given to you today.  Labwork: Your physician recommends that you have the following labs drawn: TSH, liver and lipid panel go for this fasting at stress echo appointment  Testing/Procedures: Your physician has requested that you have a stress echocardiogram. For further information please visit [email or web address]. Please follow instruction sheet as given.  Follow-Up: Your physician recommends that you schedule a follow-up appointment in: 6 months  Any Other Special Instructions Will Be Listed Below (If Applicable).     If you need a refill on your cardiac medications before your next appointment, please call your pharmacy.   [place], RN, BSN   Exercise Stress Echocardiogram, Care After This sheet gives you information about how to care for yourself after your procedure. Your doctor may also give you more specific instructions. If you have problems or questions, call your doctor. Follow these instructions at home:  You may do these things as told by your doctor: ? Eat what you normally eat. ? Do your normal activities. ? Take your normal medicines.  Take over-the-counter and prescription medicines only as told by your doctor.  Keep all follow-up visits as told by your doctor. This is important. Contact a doctor if:  You keep feeling dizzy or light-headed.  You feel like your heart is beating fast.  You keep feeling sick to your stomach (nauseous) or you throw up (vomit).  You have a headache.  You feel short of breath. Get help right away if:  You have pain or pressure in any of these areas: ? Your chest. ? Your jaw or neck. ? Between your shoulder blades. ? Down your left arm.  You pass out (faint).  You have trouble breathing. Summary  After your procedure, you may eat like normal, do  your normal activities, and take your normal medicines as told by your doctor.  Contact your doctor if you have dizziness, a fast heartbeat, or a headache.  You should also contact your doctor if you feel sick to your stomach (nauseous), you throw up (vomit), or you feel short of breath.  Get help right away if you feel pain or pressure in any of these areas: your chest, jaw, neck, between your shoulder blades, or down your right arm.  You should also get help right away if you pass out (faint) or have trouble breathing. This information is not intended to replace advice given to you by your health care provider. Make sure you discuss any questions you have with your health care provider. Document Released: 10/13/2012 Document Revised: 09/17/2015 Document Reviewed: 09/17/2015 Elsevier Interactive Patient Education  2017 [employer].

## 2017-01-30 NOTE — Progress Notes (Signed)
Cardiology Office Note:    Date:  01/30/2017   ID:  [facility]., DOB Jan 02, 1946, MRN [id number]  PCP:  [name], MD  Cardiologist:  Jenean Lindau, MD   Referring MD: [name], MD    ASSESSMENT:    1. Screening for thyroid disorder   2. Coronary artery disease involving native coronary artery of native heart, angina presence unspecified   3. Pre-operative clearance   4. Mixed hyperlipidemia   5. Essential hypertension   6. Type 2 diabetes mellitus with vascular disease ([place])    PLAN:    In order of problems listed above:  1. Secondary prevention stressed to the patient.  Importance of compliance with diet and medications stressed and he vocalized understanding.  Diet was discussed for dyslipidemia and diabetes mellitus.  He has a good exercise pattern. 2. In view of his coronary artery disease and colonoscopy he will undergo exercise stress echo.  If this is negative then he is not at high risk for coronary events during the aforementioned surgery.  Meticulous hemodynamic monitoring and continued perioperative beta-blocker will further reduce the risk of coronary events.  He will have a liver lipid check when he comes in the next few days for risk stratification. 3. Patient will be seen in follow-up appointment in 6 months or earlier if the patient has any concerns    Medication Adjustments/Labs and Tests Ordered: Current medicines are reviewed at length with the patient today.  Concerns regarding medicines are outlined above.  Orders Placed This Encounter  Procedures  . TSH  . Hepatic function panel  . Lipid panel  . EKG 12-Lead  . ECHOCARDIOGRAM STRESS TEST   No orders of the defined types were placed in this encounter.    History of Present Illness:    [facility] Stan. is a 72 y.o. male who is being seen today for the evaluation of coronary artery disease and to get established at the request of Briscoe, [name], MD.  Patient is a pleasant  72 year old male.  He has past medical history of coronary artery disease post coronary stenting in the remote past.  He has history of essential hypertension, diabetes mellitus and dyslipidemia.  He has multiple myeloma.  He was smoking but quit in the very remote past is somewhat close to 50 years ago.  Patient mentions to me that he walks on a regular basis at the gym.  No chest pain orthopnea or PND.  There is some mention of peripheral vascular disease but is not clear about this.  At the time of my evaluation, the patient is alert awake oriented and in no distress. He is planning to undergo colonoscopy and wants to be evaluated for assessment before the colonoscopy from a cardiovascular standpoint.  Past Medical History:  Diagnosis Date  . CAD (coronary artery disease) 2006   stent LAD 3.0x18 [place]her, occluded RCA  . Cancer ([place])    multiple myeloma  . Diabetes mellitus without complication ([place])   . H/O non-insulin dependent diabetes mellitus   . Hyperlipidemia   . Hypertension     Past Surgical History:  Procedure Laterality Date  . CARDIAC CATHETERIZATION  2006  . CORONARY ANGIOPLASTY WITH STENT PLACEMENT  2007  . IR FLUORO GUIDE CV LINE RIGHT  05/20/2016  . IR US GUIDE VASC ACCESS RIGHT  05/20/2016    Current Medications: Current Meds  Medication Sig  . acyclovir (ZOVIRAX) 200 MG capsule Take 1 capsule (200 mg total) by  mouth 2 (two) times daily. (Patient taking differently: Take 200 mg by mouth daily. )  . amLODipine (NORVASC) 10 MG tablet Take 10 mg by mouth daily.  [name] aspirin 81 MG tablet Take 81 mg by mouth daily.  [name] atorvastatin (LIPITOR) 20 MG tablet Take 1 tablet (20 mg total) by mouth daily.  . Blood Glucose Monitoring Suppl (ACCU-CHEK AVIVA PLUS) w/Device KIT by Does not apply route.  . gabapentin (NEURONTIN) 300 MG capsule Take 300 mg by mouth daily after supper.  . Glucose Blood (BLOOD GLUCOSE TEST STRIPS) STRP Check glucose TID before meals  . Insulin Glargine  (LANTUS SOLOSTAR) 100 UNIT/ML Solostar Pen Inject 20 Units into the skin daily. (Patient taking differently: Inject 12 Units into the skin daily. )  . lenalidomide (REVLIMID) 10 MG capsule Take 1 capsule (10 mg total) by mouth daily. Take for 21 days then 7 days off. Take with water. [id number] 01/23/17  . metoprolol succinate (TOPROL-XL) 100 MG 24 hr tablet   . metoprolol tartrate (LOPRESSOR) 100 MG tablet Take 100 mg by mouth once.  . nitroGLYCERIN (NITROSTAT) 0.4 MG SL tablet Place 1 tablet (0.4 mg total) under the tongue every 5 (five) minutes x 3 doses as needed for chest pain.  [name] RELION PEN NEEDLES 32G X 4 MM MISC   . vitamin B-12 (CYANOCOBALAMIN) 1000 MCG tablet Take 1,000 mcg by mouth daily.     Allergies:   Patient has no known allergies.   Social History   Socioeconomic History  . Marital status: Married    Spouse name: None  . Number of children: 6  . Years of education: None  . Highest education level: None  Social Needs  . Financial resource strain: None  . Food insecurity - worry: None  . Food insecurity - inability: None  . Transportation needs - medical: None  . Transportation needs - non-medical: None  Occupational History  . Occupation: retired Company secretary  Tobacco Use  . Smoking status: Never Smoker  . Smokeless tobacco: Never Used  Substance and Sexual Activity  . Alcohol use: No  . Drug use: No  . Sexual activity: Not Currently  Other Topics Concern  . None  Social History Narrative  . None     Family History: The patient's family history includes Arrhythmia in his mother; Cancer in his brother; Diabetes in his brother, brother, and sister; Heart disease in his mother; Stroke in his father.  ROS:   Please see the history of present illness.    All other systems reviewed and are negative.  EKGs/Labs/Other Studies Reviewed:    The following studies were reviewed today: Reviewed records from primary care physician's office.  EKG unremarkable.  Sinus  rhythm and nonspecific ST changes.   Recent Labs: 05/29/2016: Magnesium 1.8 01/08/2017: ALT 27; BUN 27.4; Creatinine 1.8; HGB 12.3; Platelets 174; Potassium 4.0; Sodium 138  Recent Lipid Panel    Component Value Date/Time   CHOL 178 01/11/2013 1030   TRIG 84.0 01/11/2013 1030   HDL 48.40 01/11/2013 1030   CHOLHDL 4 01/11/2013 1030   VLDL 16.8 01/11/2013 1030   LDLCALC 113 (H) 01/11/2013 1030    Physical Exam:    VS:  BP (!) 150/84 (BP Location: Right Arm, Patient Position: Sitting, Cuff Size: Normal)   Pulse 79   Ht 5' 11"  (1.803 m)   Wt 163 lb (73.9 kg)   SpO2 98%   BMI 22.73 kg/m     Wt Readings from Last 3 Encounters:  01/30/17  163 lb (73.9 kg)  01/15/17 164 lb 9.6 oz (74.7 kg)  12/02/16 161 lb 1.6 oz (73.1 kg)     GEN: Patient is in no acute distress HEENT: Normal NECK: No JVD; No carotid bruits LYMPHATICS: No lymphadenopathy CARDIAC: S1 S2 regular, 2/6 systolic murmur at the apex. RESPIRATORY:  Clear to auscultation without rales, wheezing or rhonchi  ABDOMEN: Soft, non-tender, non-distended MUSCULOSKELETAL:  No edema; No deformity  SKIN: Warm and dry NEUROLOGIC:  Alert and oriented x 3 PSYCHIATRIC:  Normal affect    Signed, Jenean Lindau, MD  01/30/2017 10:31 AM    [place]

## 2017-02-18 ENCOUNTER — Other Ambulatory Visit: Payer: Self-pay | Admitting: Hematology

## 2017-02-19 ENCOUNTER — Encounter (HOSPITAL_BASED_OUTPATIENT_CLINIC_OR_DEPARTMENT_OTHER): Payer: Self-pay

## 2017-02-19 ENCOUNTER — Ambulatory Visit (HOSPITAL_BASED_OUTPATIENT_CLINIC_OR_DEPARTMENT_OTHER)
Admission: RE | Admit: 2017-02-19 | Discharge: 2017-02-19 | Disposition: A | Discharge status: Home / Self Care | Payer: Medicare HMO | Source: Ambulatory Visit | Attending: Cardiology | Admitting: Cardiology

## 2017-02-19 ENCOUNTER — Other Ambulatory Visit: Payer: Self-pay

## 2017-02-19 ENCOUNTER — Ambulatory Visit (HOSPITAL_BASED_OUTPATIENT_CLINIC_OR_DEPARTMENT_OTHER): Payer: Medicare HMO

## 2017-02-19 DIAGNOSIS — I420 Dilated cardiomyopathy: Secondary | ICD-10-CM | POA: Insufficient documentation

## 2017-02-19 DIAGNOSIS — I051 Rheumatic mitral insufficiency: Secondary | ICD-10-CM | POA: Diagnosis not present

## 2017-02-19 DIAGNOSIS — I5030 Unspecified diastolic (congestive) heart failure: Secondary | ICD-10-CM | POA: Diagnosis not present

## 2017-02-19 DIAGNOSIS — I2510 Atherosclerotic heart disease of native coronary artery without angina pectoris: Secondary | ICD-10-CM | POA: Diagnosis present

## 2017-02-19 NOTE — Progress Notes (Signed)
Echocardiogram 2D Echocardiogram has been performed.  [facility] 02/19/2017, 2:00 PM

## 2017-02-19 NOTE — Progress Notes (Signed)
Echocardiogram 2D Echocardiogram has been performed.  [facility] 02/19/2017, 2:21 PM

## 2017-02-23 ENCOUNTER — Other Ambulatory Visit: Payer: Self-pay | Admitting: *Deleted

## 2017-02-23 DIAGNOSIS — C9000 Multiple myeloma not having achieved remission: Secondary | ICD-10-CM

## 2017-02-23 MED ORDER — LENALIDOMIDE 10 MG PO CAPS: 10.0000 mg | ORAL_CAPSULE | Freq: Every day | ORAL | 0 refills | Status: DC

## 2017-02-23 NOTE — Progress Notes (Signed)
[name]  HEMATOLOGY ONCOLOGY PROGRESS NOTE  Date of service: 02/25/17  Patient Care Team: [name], MD as PCP - General (Family Medicine)  CC: f/u for multiple myeloma  Diagnosis:  IgG Kappa multiple myeloma   Current Treatment:  Rd  INTERVAL HISTORY: Douglas Rose and his wife are here for follow-up before his 5th cycle of Revlimid treatment for his multiple myeloma. The patient's last visit with [name] was on 01/15/17. He is accompanied today by his wife. The pt reports that he is doing well overall and looks forward to spending time with his son. The pt reports that he is still trying to get a colonoscopy, needing cardiologist clearance before being able to do this. He reports no problems taking the Revlimid.   No labs were taken since the patient's last visit.  He was evaluated by Dr Maylene Roes at [facility] for transplant consideration and has decided not to pursue this.  On review of systems, pt denies abdominal pains, skin rashes, leg swelling, headaches, and any other symptoms.    REVIEW OF SYSTEMS:    .10 Point review of Systems was done is negative except as noted above.  Past Medical History:  Diagnosis Date  . CAD (coronary artery disease) 2006   stent LAD 3.0x18 Cypher, occluded RCA  . Cancer ([place])    multiple myeloma  . Diabetes mellitus without complication ([place])   . H/O non-insulin dependent diabetes mellitus   . Hyperlipidemia   . Hypertension     . Past Surgical History:  Procedure Laterality Date  . CARDIAC CATHETERIZATION  2006  . CORONARY ANGIOPLASTY WITH STENT PLACEMENT  2007  . IR FLUORO GUIDE CV LINE RIGHT  05/20/2016  . IR US GUIDE VASC ACCESS RIGHT  05/20/2016    . Social History   Tobacco Use  . Smoking status: Never Smoker  . Smokeless tobacco: Never Used  Substance Use Topics  . Alcohol use: No  . Drug use: No    ALLERGIES:  has No Known Allergies.  MEDICATIONS:  Current Outpatient Medications  Medication Sig Dispense Refill  . acyclovir  (ZOVIRAX) 200 MG capsule Take 1 capsule (200 mg total) by mouth 2 (two) times daily. (Patient taking differently: Take 200 mg by mouth daily. ) 30 capsule 3  . acyclovir (ZOVIRAX) 200 MG capsule TAKE 1 CAPSULE BY MOUTH TWICE DAILY 90 capsule 1  . amLODipine (NORVASC) 10 MG tablet Take 10 mg by mouth daily.    [name] aspirin 81 MG tablet Take 81 mg by mouth daily.    [name] atorvastatin (LIPITOR) 20 MG tablet Take 1 tablet (20 mg total) by mouth daily. 90 tablet 3  . Blood Glucose Monitoring Suppl (ACCU-CHEK AVIVA PLUS) w/Device KIT by Does not apply route.    . gabapentin (NEURONTIN) 300 MG capsule Take 300 mg by mouth daily after supper.    . Glucose Blood (BLOOD GLUCOSE TEST STRIPS) STRP Check glucose TID before meals    . Insulin Glargine (LANTUS SOLOSTAR) 100 UNIT/ML Solostar Pen Inject 20 Units into the skin daily. (Patient taking differently: Inject 12 Units into the skin daily. ) 15 mL 11  . lenalidomide (REVLIMID) 10 MG capsule Take 1 capsule (10 mg total) by mouth daily. Take for 21 days then 7 days off. Take with water. [id number] 21 capsule 0  . metoprolol succinate (TOPROL-XL) 100 MG 24 hr tablet     . metoprolol tartrate (LOPRESSOR) 100 MG tablet Take 100 mg by mouth once.    . nitroGLYCERIN (NITROSTAT)  0.4 MG SL tablet Place 1 tablet (0.4 mg total) under the tongue every 5 (five) minutes x 3 doses as needed for chest pain. 30 tablet 12  . RELION PEN NEEDLES 32G X 4 MM MISC     . vitamin B-12 (CYANOCOBALAMIN) 1000 MCG tablet Take 1,000 mcg by mouth daily.     No current facility-administered medications for this visit.     PHYSICAL EXAMINATION: ECOG PERFORMANCE STATUS: 1 - Symptomatic but completely ambulatory  . Vitals:   02/25/17 1521  BP: (!) 158/95  Pulse: 66  Resp: 18  Temp: 97.7 F (36.5 C)  SpO2: 100%    Filed Weights   02/25/17 1521  Weight: 169 lb 3.2 oz (76.7 kg)   .Body mass index is 23.6 kg/m.  GENERAL:alert, in no acute distress and comfortable SKIN: no  acute rashes, no significant lesions EYES: conjunctiva are pink and non-injected, sclera anicteric OROPHARYNX: MMM, no exudates, no oropharyngeal erythema or ulceration NECK: supple, no JVD LYMPH:  no palpable lymphadenopathy in the cervical, axillary or inguinal regions LUNGS: clear to auscultation b/l with normal respiratory effort HEART: regular rate & rhythm ABDOMEN:  normoactive bowel sounds , non tender, not distended. Extremity: no pedal edema PSYCH: alert & oriented x 3 with fluent speech NEURO: no focal motor/sensory deficits   LABORATORY DATA:   I have reviewed the data as listed  . CBC Latest Ref Rng & Units 01/08/2017 11/25/2016 11/04/2016  WBC 4.0 - 10.3 10e3/uL 5.7 4.1 9.5  Hemoglobin 13.0 - 17.1 g/dL 12.3(L) 10.6(L) 11.3(L)  Hematocrit 38.4 - 49.9 % 37.7(L) 32.2(L) 34.1(L)  Platelets 140 - 400 10e3/uL 174 180 313    . CMP Latest Ref Rng & Units 01/08/2017 01/08/2017 11/25/2016  Glucose 70 - 140 mg/dl 343(H) - 151(H)  BUN 7.0 - 26.0 mg/dL 27.4(H) - 22.4  Creatinine 0.7 - 1.3 mg/dL 1.8(H) - 1.5(H)  Sodium 136 - 145 mEq/L 138 - 141  Potassium 3.5 - 5.1 mEq/L 4.0 - 3.9  Chloride 101 - 111 mmol/L - - -  CO2 22 - 29 mEq/L 28 - 27  Calcium 8.4 - 10.4 mg/dL 9.1 - 8.9  Total Protein 6.0 - 8.5 g/dL 7.0 6.4 6.7  Total Bilirubin 0.20 - 1.20 mg/dL 0.35 - 0.27  Alkaline Phos 40 - 150 U/L 101 - 80  AST 5 - 34 U/L 13 - 13  ALT 0 - 55 U/L 27 - 20           RADIOGRAPHIC STUDIES: I have personally reviewed the radiological images as listed and agreed with the findings in the report. No results found.  ASSESSMENT & PLAN:   72 y.o.  with multiple medical comorbidities but fairly good performance status overall with   1) ISS Stage II IgG Kapppa multiple myeloma. Anemia + Renal insufficiency. Bone survey neg for concerning bone lesions. significantly elevated kappafree light chains 858 mg percent, lambda 18.4, with a ratio of 46.65 . He was also noted to have an M spike  of 1.7 g/dL with IFE showing IgG kappa monoclonal protein. Quantitative IgG level was increased to nearly 2500 mg/dL. 24-hour UPEP showed monoclonal protein of 1300 mg per 24 hours which constituted about 86% of all the urinary protein. Bone marrow biopsy showed 20-30% Restricted plasma cells consistent with multiple myeloma Cytogenetics/FISH  Showed   Patient has been treated with CyBord X 2 cycles . Treatment interrupted due to severe c diff colitis with prolonged hospitalization and decline in performance status.  Patient is  much improved and back to baseline now. 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 Had an element of obstructive uropathy due to BPH which has resolved. Creatinine continues to improve from 3.5 to 2.9 to 1.9 to 1.6. Stable 1.6-1.8  3) Normocytic anemia likely related to multiple myeloma.-stable, improved. hgb 11.9 No abdominal pain or discomfort over the right upper quadrant. No fevers or chills. Plan -Patient has no prohibitive toxicities from his Rd treatment thus far - labs today showed improving anemia hgb improved from 10.6 to 11.9., creatinine stable. -Continue follow-up with nephrology- patient is following with Dr Ila Mcgill. -Pt has decided to not get a transplant after meeting with Dr Maylene Roes at [facility] on 01/28/17. This was discussed in details. -He is beginning his 5th cycle of 90m Revlimid on 03/02/17 -Will see pt every 2 months now that he is beginning maintenance therapy. -Continue 858maspirin.   4) 5) DM2 - better controled -continue f/u with PCP.  Labs today RTC with Dr KaIrene Limbon 2 months with labs   . The total time spent in the appointment was 25 minutes and more than 50% was on counseling and direct patient cares.    GaSullivan LoneD MSHersheyAHIVMS SCSan Miguel Corp Alta Vista Regional HospitalTKaweah Delta Skilled Nursing Facilityematology/Oncology Physician CoMarion General Hospital(Office):        [phone] cell):  [phone]):           [phone] document serves as a record of services personally performed by GaSullivan LoneMD. It was created on his behalf by ScBaldwin Jamaicaa trained medical scribe. The creation of this record is based on the scribe's personal observations and the provider's statements to them.   .I have reviewed the above documentation for accuracy and completeness, and I agree with the above. .GBrunetta GeneraD MS

## 2017-02-25 ENCOUNTER — Inpatient Hospital Stay: Payer: Medicare HMO

## 2017-02-25 ENCOUNTER — Telehealth: Payer: Self-pay | Admitting: Hematology

## 2017-02-25 ENCOUNTER — Inpatient Hospital Stay: Payer: Medicare HMO | Attending: Hematology | Admitting: Hematology

## 2017-02-25 ENCOUNTER — Encounter: Payer: Self-pay | Admitting: Hematology

## 2017-02-25 VITALS — BP 158/95 | HR 66 | Temp 97.7°F | Resp 18 | Ht 71.0 in | Wt 169.2 lb

## 2017-02-25 DIAGNOSIS — N08 Glomerular disorders in diseases classified elsewhere: Secondary | ICD-10-CM

## 2017-02-25 DIAGNOSIS — A0472 Enterocolitis due to Clostridium difficile, not specified as recurrent: Secondary | ICD-10-CM | POA: Diagnosis not present

## 2017-02-25 DIAGNOSIS — I10 Essential (primary) hypertension: Secondary | ICD-10-CM | POA: Insufficient documentation

## 2017-02-25 DIAGNOSIS — Z794 Long term (current) use of insulin: Secondary | ICD-10-CM | POA: Diagnosis not present

## 2017-02-25 DIAGNOSIS — D649 Anemia, unspecified: Secondary | ICD-10-CM | POA: Insufficient documentation

## 2017-02-25 DIAGNOSIS — C9000 Multiple myeloma not having achieved remission: Secondary | ICD-10-CM | POA: Diagnosis present

## 2017-02-25 DIAGNOSIS — E119 Type 2 diabetes mellitus without complications: Secondary | ICD-10-CM | POA: Insufficient documentation

## 2017-02-25 DIAGNOSIS — N289 Disorder of kidney and ureter, unspecified: Secondary | ICD-10-CM | POA: Insufficient documentation

## 2017-02-25 LAB — CBC WITH DIFFERENTIAL (CANCER CENTER ONLY)
BASOS PCT: 0 %
Basophils Absolute: 0 10*3/uL (ref 0.0–0.1)
Eosinophils Absolute: 0.9 10*3/uL — ABNORMAL HIGH (ref 0.0–0.5)
Eosinophils Relative: 19 %
HEMATOCRIT: 37 % — AB (ref 38.4–49.9)
Hemoglobin: 11.9 g/dL — ABNORMAL LOW (ref 13.0–17.1)
LYMPHS PCT: 39 %
Lymphs Abs: 1.9 10*3/uL (ref 0.9–3.3)
MCH: 27.7 pg (ref 27.2–33.4)
MCHC: 32.2 g/dL (ref 32.0–36.0)
MCV: 86 fL (ref 79.3–98.0)
MONO ABS: 0.6 10*3/uL (ref 0.1–0.9)
MONOS PCT: 13 %
Neutro Abs: 1.5 10*3/uL (ref 1.5–6.5)
Neutrophils Relative %: 29 %
Platelet Count: 176 10*3/uL (ref 140–400)
RBC: 4.3 MIL/uL (ref 4.20–5.82)
RDW: 15.7 % — AB (ref 11.0–14.6)
WBC Count: 5 10*3/uL (ref 4.0–10.3)

## 2017-02-25 LAB — CMP (CANCER CENTER ONLY)
ALBUMIN: 3.8 g/dL (ref 3.5–5.0)
ALK PHOS: 96 U/L (ref 40–150)
ALT: 23 U/L (ref 0–55)
ANION GAP: 11 (ref 3–11)
AST: 16 U/L (ref 5–34)
BUN: 19 mg/dL (ref 7–26)
CALCIUM: 9.5 mg/dL (ref 8.4–10.4)
CO2: 26 mmol/L (ref 22–29)
Chloride: 103 mmol/L (ref 98–109)
Creatinine: 1.75 mg/dL — ABNORMAL HIGH (ref 0.70–1.30)
GFR, Est AFR Am: 43 mL/min — ABNORMAL LOW (ref >60)
GFR, Estimated: 37 mL/min — ABNORMAL LOW (ref >60)
GLUCOSE: 211 mg/dL — AB (ref 70–140)
Potassium: 3.6 mmol/L (ref 3.5–5.1)
SODIUM: 140 mmol/L (ref 136–145)
Total Bilirubin: 0.4 mg/dL (ref 0.2–1.2)
Total Protein: 7.6 g/dL (ref 6.4–8.3)

## 2017-02-25 LAB — RETICULOCYTES
RBC.: 4.3 MIL/uL (ref 4.20–5.82)
Retic Count, Absolute: 55.9 10*3/uL (ref 34.8–93.9)
Retic Ct Pct: 1.3 % (ref 0.8–1.8)

## 2017-02-25 NOTE — Patient Instructions (Signed)
Thank you for choosing [facility] to provide your oncology and hematology care.  To afford each patient quality time with our providers, please arrive 30 minutes before your scheduled appointment time.  If you arrive late for your appointment, you may be asked to reschedule.  We strive to give you quality time with our providers, and arriving late affects you and other patients whose appointments are after yours.   If you are a no show for multiple scheduled visits, you may be dismissed from the clinic at the providers discretion.    Again, thank you for choosing [facility], our hope is that these requests will decrease the amount of time that you wait before being seen by our physicians.  ______________________________________________________________________  Should you have questions after your visit to the [facility], please contact our office at [phone] between the hours of 8:30 and 4:30 p.m.    Voicemails left after 4:30p.m will not be returned until the following business day.    For prescription refill requests, please have your pharmacy contact us directly.  Please also try to allow 48 hours for prescription requests.    Please contact the scheduling department for questions regarding scheduling.  For scheduling of procedures such as PET scans, CT scans, MRI, Ultrasound, etc please contact central scheduling at [phone].    Resources For Cancer Patients and Caregivers:   Oncolink.org:  A wonderful resource for patients and healthcare providers for information regarding your disease, ways to tract your treatment, what to expect, etc.     American Cancer Society:  800-227-2345  Can help patients locate various types of support and financial assistance  Cancer Care: 1-800-813-HOPE (4673) Provides financial assistance, online support groups, medication/co-pay assistance.    [place] DSS:  336-641-3447 Where to apply for food  stamps, Medicaid, and utility assistance  Medicare Rights Center: 800-333-4114 Helps people with Medicare understand their rights and benefits, navigate the Medicare system, and secure the quality healthcare they deserve  SCAT: 336-333-6589 [place] [employer]'s shared-ride transportation service for eligible riders who have a disability that prevents them from riding the fixed route bus.    For additional information on assistance programs please contact our social worker:   Grier Hock/Abigail Elmore:  [phone]            

## 2017-02-25 NOTE — Telephone Encounter (Signed)
Appointments scheduled AVS/Calendar printed per 2/20 los

## 2017-02-26 LAB — KAPPA/LAMBDA LIGHT CHAINS
Kappa free light chain: 43.8 mg/L — ABNORMAL HIGH (ref 3.3–19.4)
Kappa, lambda light chain ratio: 1.55 (ref 0.26–1.65)
Lambda free light chains: 28.2 mg/L — ABNORMAL HIGH (ref 5.7–26.3)

## 2017-03-02 ENCOUNTER — Telehealth (HOSPITAL_COMMUNITY): Payer: Self-pay | Admitting: *Deleted

## 2017-03-02 LAB — MULTIPLE MYELOMA PANEL, SERUM
ALBUMIN/GLOB SERPL: 1.1 (ref 0.7–1.7)
ALPHA 1: 0.2 g/dL (ref 0.0–0.4)
ALPHA2 GLOB SERPL ELPH-MCNC: 0.9 g/dL (ref 0.4–1.0)
Albumin SerPl Elph-Mcnc: 3.7 g/dL (ref 2.9–4.4)
B-Globulin SerPl Elph-Mcnc: 1 g/dL (ref 0.7–1.3)
GAMMA GLOB SERPL ELPH-MCNC: 1.3 g/dL (ref 0.4–1.8)
GLOBULIN, TOTAL: 3.4 g/dL (ref 2.2–3.9)
IGA: 253 mg/dL (ref 61–437)
IGG (IMMUNOGLOBIN G), SERUM: 1402 mg/dL (ref 700–1600)
IgM (Immunoglobulin M), Srm: 44 mg/dL (ref 15–143)
Total Protein ELP: 7.1 g/dL (ref 6.0–8.5)

## 2017-03-02 NOTE — Telephone Encounter (Signed)
Patient given detailed instructions per Myocardial Perfusion Study Information Sheet for the test on 03/05/17 at 1000. Patient notified to arrive 15 minutes early and that it is imperative to arrive on time for appointment to keep from having the test rescheduled.  If you need to cancel or reschedule your appointment, please call the office within 24 hours of your appointment. . Patient verbalized understanding.Douglas Rose, Ranae Palms

## 2017-03-05 ENCOUNTER — Ambulatory Visit (HOSPITAL_COMMUNITY): Payer: Medicare HMO | Attending: Cardiovascular Disease

## 2017-03-05 DIAGNOSIS — I2510 Atherosclerotic heart disease of native coronary artery without angina pectoris: Secondary | ICD-10-CM | POA: Diagnosis present

## 2017-03-05 LAB — MYOCARDIAL PERFUSION IMAGING
CHL CUP NUCLEAR SDS: 2
CHL CUP RESTING HR STRESS: 59 {beats}/min
LHR: 0.35
LV dias vol: 114 mL (ref 62–150)
LV sys vol: 61 mL
Peak HR: 83 {beats}/min
SRS: 2
SSS: 4
TID: 1.02

## 2017-03-05 MED ORDER — TECHNETIUM TC 99M SESTAMIBI GENERIC - CARDIOLITE
10.8000 | Freq: Once | INTRAVENOUS | Status: AC | PRN
  Administered 2017-03-05: 10.8 via INTRAVENOUS
  Filled 2017-03-05: qty 11

## 2017-03-05 MED ORDER — REGADENOSON 0.4 MG/5ML IV SOLN
0.4000 mg | Freq: Once | INTRAVENOUS | Status: AC
  Administered 2017-03-05: 0.4 mg via INTRAVENOUS

## 2017-03-05 MED ORDER — TECHNETIUM TC 99M TETROFOSMIN IV KIT
32.7000 | PACK | Freq: Once | INTRAVENOUS | Status: AC | PRN
  Administered 2017-03-05: 32.7 via INTRAVENOUS
  Filled 2017-03-05: qty 33

## 2017-03-25 ENCOUNTER — Other Ambulatory Visit: Payer: Self-pay | Admitting: *Deleted

## 2017-03-25 DIAGNOSIS — C9000 Multiple myeloma not having achieved remission: Secondary | ICD-10-CM

## 2017-03-25 MED ORDER — LENALIDOMIDE 10 MG PO CAPS: 10.0000 mg | ORAL_CAPSULE | Freq: Every day | ORAL | 0 refills | Status: DC

## 2017-03-31 ENCOUNTER — Telehealth: Payer: Self-pay

## 2017-03-31 NOTE — Telephone Encounter (Signed)
Pt wife called requesting clearance for pt to fly to [place] In August. [name] does not have a recommendation at this time, and clearance will depend on how the pt is doing closer to the departure date. F/u in May. Shared recommendation with pt who plans to go ahead with the trip as planned and cancel if there is physical reason to do so at the time of departure.

## 2017-04-03 ENCOUNTER — Other Ambulatory Visit: Payer: Self-pay

## 2017-04-03 DIAGNOSIS — I10 Essential (primary) hypertension: Secondary | ICD-10-CM

## 2017-04-03 DIAGNOSIS — I2510 Atherosclerotic heart disease of native coronary artery without angina pectoris: Secondary | ICD-10-CM

## 2017-04-04 LAB — TSH: TSH: 0.703 u[IU]/mL (ref 0.450–4.500)

## 2017-04-04 LAB — LIPID PANEL
CHOLESTEROL TOTAL: 134 mg/dL (ref 100–199)
Chol/HDL Ratio: 2.7 ratio (ref 0.0–5.0)
HDL: 50 mg/dL (ref >39)
LDL Calculated: 64 mg/dL (ref 0–99)
TRIGLYCERIDES: 101 mg/dL (ref 0–149)
VLDL Cholesterol Cal: 20 mg/dL (ref 5–40)

## 2017-04-20 ENCOUNTER — Other Ambulatory Visit: Payer: Self-pay | Admitting: Hematology

## 2017-04-20 DIAGNOSIS — C9000 Multiple myeloma not having achieved remission: Secondary | ICD-10-CM

## 2017-04-21 ENCOUNTER — Other Ambulatory Visit: Payer: Self-pay | Admitting: *Deleted

## 2017-04-21 DIAGNOSIS — C9000 Multiple myeloma not having achieved remission: Secondary | ICD-10-CM

## 2017-04-21 MED ORDER — LENALIDOMIDE 10 MG PO CAPS: 10.0000 mg | ORAL_CAPSULE | Freq: Every day | ORAL | 0 refills | Status: DC

## 2017-05-18 NOTE — Progress Notes (Signed)
Douglas Kitchen  HEMATOLOGY ONCOLOGY PROGRESS NOTE  Date of service: 05/20/17  Patient Care Team: [name], MD as PCP - General (Family Medicine)  CC: f/u for multiple myeloma  Diagnosis:  IgG Kappa multiple myeloma   Current Treatment:  Revlimid maintenance   INTERVAL HISTORY: [name] and his wife are here for follow-up regarding his maintenance Revlimid treatment for his multiple myeloma.The patient's last visit with [name] was on 02/25/17. He is accompanied today by his partner. The pt reports that he is doing well overall.   The pt reports that he has gained some desired weight recently. He notes no problems taking his Revlimid at this time. He is taking 191m Aspirin daily and continues follow up with his cardiologist.   He notes that his feet neuropathy has gotten a little better, and he notes some ankle swelling which reduces at night when he puts his feet up.   Lab results today (05/20/17) of CBC, CMP, and Reticulocytes is as follows: all values are WNL except for WBC at 3.1k, Hgb at 12.2, HCT at 36.9, RDW at 16.7, ANC at 1.3k, Glucose at 240, Creatinine at 1.78. MMP and Kappa/Lambda 05/20/17 are pending.   On review of systems, pt reports decreased feet neuropathy, some ankle swelling, good energy levels, and denies abdominal pains and any other symptoms.    REVIEW OF SYSTEMS:   A 10+ POINT REVIEW OF SYSTEMS WAS OBTAINED including neurology, dermatology, psychiatry, cardiac, respiratory, lymph, extremities, GI, GU, Musculoskeletal, constitutional, breasts, reproductive, HEENT.  All pertinent positives are noted in the HPI.  All others are negative.   Past Medical History:  Diagnosis Date  . CAD (coronary artery disease) 2006   stent LAD 3.0x18 Cypher, occluded RCA  . Cancer ([place]    multiple myeloma  . Diabetes mellitus without complication ([place]   . H/O non-insulin dependent diabetes mellitus   . Hyperlipidemia   . Hypertension     . Past Surgical History:  Procedure  Laterality Date  . CARDIAC CATHETERIZATION  2006  . CORONARY ANGIOPLASTY WITH STENT PLACEMENT  2007  . IR FLUORO GUIDE CV LINE RIGHT  05/20/2016  . IR [place] VASC ACCESS RIGHT  05/20/2016    . Social History   Tobacco Use  . Smoking status: Never Smoker  . Smokeless tobacco: Never Used  Substance Use Topics  . Alcohol use: No  . Drug use: No    ALLERGIES:  has No Known Allergies.  MEDICATIONS:  Current Outpatient Medications  Medication Sig Dispense Refill  . acyclovir (ZOVIRAX) 200 MG capsule Take 1 capsule (200 mg total) by mouth 2 (two) times daily. (Patient taking differently: Take 200 mg by mouth daily. ) 30 capsule 3  . acyclovir (ZOVIRAX) 200 MG capsule TAKE 1 CAPSULE BY MOUTH TWICE DAILY 90 capsule 1  . amLODipine (NORVASC) 10 MG tablet Take 10 mg by mouth daily.    .Douglas Kitchenaspirin 81 MG tablet Take 81 mg by mouth daily.    .Douglas Kitchenatorvastatin (LIPITOR) 20 MG tablet Take 1 tablet (20 mg total) by mouth daily. 90 tablet 3  . Blood Glucose Monitoring Suppl (ACCU-CHEK AVIVA PLUS) w/Device KIT by Does not apply route.    . gabapentin (NEURONTIN) 300 MG capsule Take 300 mg by mouth daily after supper.    . Glucose Blood (BLOOD GLUCOSE TEST STRIPS) STRP Check glucose TID before meals    . Insulin Glargine (LANTUS SOLOSTAR) 100 UNIT/ML Solostar Pen Inject 20 Units into the skin daily. (Patient taking differently:  Inject 12 Units into the skin daily. ) 15 mL 11  . lenalidomide (REVLIMID) 10 MG capsule Take 1 capsule (10 mg total) by mouth daily. Take for 21 days then 7 days off. Take with water. KPVV#[id number] 21 capsule 0  . metoprolol tartrate (LOPRESSOR) 100 MG tablet Take 100 mg by mouth once.    . nitroGLYCERIN (NITROSTAT) 0.4 MG SL tablet Place 1 tablet (0.4 mg total) under the tongue every 5 (five) minutes x 3 doses as needed for chest pain. 30 tablet 12  . RELION PEN NEEDLES 32G X 4 MM MISC     . vitamin B-12 (CYANOCOBALAMIN) 1000 MCG tablet Take 1,000 mcg by mouth daily.     No  current facility-administered medications for this visit.     PHYSICAL EXAMINATION: ECOG PERFORMANCE STATUS: 1 - Symptomatic but completely ambulatory  . Vitals:   05/20/17 1533  BP: (!) 156/78  Pulse: 60  Resp: 18  Temp: 98 F (36.7 C)  SpO2: 100%    Filed Weights   05/20/17 1533  Weight: 173 lb (78.5 kg)   .Body mass index is 24.13 kg/m.  GENERAL:alert, in no acute distress and comfortable SKIN: no acute rashes, no significant lesions EYES: conjunctiva are pink and non-injected, sclera anicteric OROPHARYNX: MMM, no exudates, no oropharyngeal erythema or ulceration NECK: supple, no JVD LYMPH:  no palpable lymphadenopathy in the cervical, axillary or inguinal regions LUNGS: clear to auscultation b/l with normal respiratory effort HEART: regular rate & rhythm ABDOMEN:  normoactive bowel sounds , non tender, not distended. Extremity: no pedal edema PSYCH: alert & oriented x 3 with fluent speech NEURO: no focal motor/sensory deficits   LABORATORY DATA:   I have reviewed the data as listed  . CBC Latest Ref Rng & Units 05/20/2017 02/25/2017 01/08/2017  WBC 4.0 - 10.3 K/uL 3.1(L) 5.0 5.7  Hemoglobin 13.0 - 17.1 g/dL 12.2(L) 11.9(L) 12.3(L)  Hematocrit 38.4 - 49.9 % 36.9(L) 37.0(L) 37.7(L)  Platelets 140 - 400 K/uL 151 176 174  ANC 1.3k  . CMP Latest Ref Rng & Units 05/20/2017 02/25/2017 01/08/2017  Glucose 70 - 140 mg/dL 240(H) 211(H) 343(H)  BUN 7 - 26 mg/dL 20 19 27.4(H)  Creatinine 0.70 - 1.30 mg/dL 1.78(H) 1.75(H) 1.8(H)  Sodium 136 - 145 mmol/L 138 140 138  Potassium 3.5 - 5.1 mmol/L 4.0 3.6 4.0  Chloride 98 - 109 mmol/L 104 103 -  CO2 22 - 29 mmol/L 28 26 28   Calcium 8.4 - 10.4 mg/dL 9.1 9.5 9.1  Total Protein 6.4 - 8.3 g/dL 7.7 7.6 7.0  Total Bilirubin 0.2 - 1.2 mg/dL 0.4 0.4 0.35  Alkaline Phos 40 - 150 U/L 95 96 101  AST 5 - 34 U/L 16 16 13   ALT 0 - 55 U/L 18 23 27            RADIOGRAPHIC STUDIES: I have personally reviewed the radiological images  as listed and agreed with the findings in the report. No results found.  ASSESSMENT & PLAN:   72 y.o.  with multiple medical comorbidities but fairly good performance status overall with   1) ISS Stage II IgG Kapppa multiple myeloma. Anemia + Renal insufficiency. Bone survey neg for concerning bone lesions. significantly elevated kappafree light chains 858 mg percent, lambda 18.4, with a ratio of 46.65 . He was also noted to have an M spike of 1.7 g/dL with IFE showing IgG kappa monoclonal protein. Quantitative IgG level was increased to nearly 2500 mg/dL. 24-hour UPEP showed monoclonal protein  of 1300 mg per 24 hours which constituted about 86% of all the urinary protein. Bone marrow biopsy showed 20-30% Restricted plasma cells consistent with multiple myeloma Cytogenetics/FISH  Showed   Patient has been treated with CyBord X 2 cycles . Treatment interrupted due to severe c diff colitis with prolonged hospitalization and decline in performance status.  Patient is much improved and back to baseline now. 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 Had an element of obstructive uropathy due to BPH which has resolved. Creatinine continues to improve from 3.5 to 2.9 to 1.9 to 1.6. Stable 1.6-1.8  3) Normocytic anemia likely related to multiple myeloma.-stable, improved. hgb 11.9 No abdominal pain or discomfort over the right upper quadrant. No fevers or chills. Plan -Discussed pt labwork today, 05/20/17; blood counts and chemistries are stable. Hgb at 12.2. WBC a little low as expected at the end of a cycle.  -continue maintenance Revlimid 4m po 21 days on 7 days off -Stay well hydrated, avoid caffeine and ETOH, use compression socks, move [place]  -Continue follow-up with nephrology- patient is following with Dr MIla Mcgill -Pt has decided to not get a transplant [facility] This was discussed in details. -Will see pt back in 2 months -The pt has no prohibitive toxicities from continuing maintenance Revlimid at this time.    4) 5) DM2 - better controled -continue f/u with PCP.   RTC with Dr KIrene Limboin 2 months with labs     The toal time spent in the appt was 25 minutes and more than 50% was on counseling and direct patient cares.    GSullivan LoneMD MKanopolisAAHIVMS SHeartland Behavioral Health ServicesCWard Memorial HospitalHematology/Oncology Physician CThe Outer Banks Hospital (Office):       [phone](Work cell):  [phone](Fax):           [phone] This document serves as a record of services personally performed by GSullivan Lone MD. It was created on his behalf by SBaldwin Jamaica a trained medical scribe. The creation of this record is based on the scribe's personal observations and the provider's statements to them.   .I have reviewed the above documentation for accuracy and completeness, and I agree with the above. .Brunetta GeneraMD MS

## 2017-05-20 ENCOUNTER — Inpatient Hospital Stay: Payer: Medicare HMO | Attending: Hematology

## 2017-05-20 ENCOUNTER — Encounter: Payer: Self-pay | Admitting: Hematology

## 2017-05-20 ENCOUNTER — Telehealth: Payer: Self-pay | Admitting: Hematology

## 2017-05-20 ENCOUNTER — Inpatient Hospital Stay (HOSPITAL_BASED_OUTPATIENT_CLINIC_OR_DEPARTMENT_OTHER): Payer: Medicare HMO | Admitting: Hematology

## 2017-05-20 VITALS — BP 156/78 | HR 60 | Temp 98.0°F | Resp 18 | Ht 71.0 in | Wt 173.0 lb

## 2017-05-20 DIAGNOSIS — N179 Acute kidney failure, unspecified: Secondary | ICD-10-CM | POA: Insufficient documentation

## 2017-05-20 DIAGNOSIS — E119 Type 2 diabetes mellitus without complications: Secondary | ICD-10-CM | POA: Insufficient documentation

## 2017-05-20 DIAGNOSIS — N08 Glomerular disorders in diseases classified elsewhere: Secondary | ICD-10-CM

## 2017-05-20 DIAGNOSIS — C9000 Multiple myeloma not having achieved remission: Secondary | ICD-10-CM

## 2017-05-20 DIAGNOSIS — D649 Anemia, unspecified: Secondary | ICD-10-CM | POA: Insufficient documentation

## 2017-05-20 LAB — CBC WITH DIFFERENTIAL (CANCER CENTER ONLY)
Basophils Absolute: 0 10*3/uL (ref 0.0–0.1)
Basophils Relative: 0 %
EOS ABS: 0.4 10*3/uL (ref 0.0–0.5)
Eosinophils Relative: 14 %
HCT: 36.9 % — ABNORMAL LOW (ref 38.4–49.9)
Hemoglobin: 12.2 g/dL — ABNORMAL LOW (ref 13.0–17.1)
LYMPHS ABS: 1.1 10*3/uL (ref 0.9–3.3)
Lymphocytes Relative: 34 %
MCH: 28 pg (ref 27.2–33.4)
MCHC: 33.1 g/dL (ref 32.0–36.0)
MCV: 84.8 fL (ref 79.3–98.0)
MONOS PCT: 11 %
Monocytes Absolute: 0.4 10*3/uL (ref 0.1–0.9)
Neutro Abs: 1.3 10*3/uL — ABNORMAL LOW (ref 1.5–6.5)
Neutrophils Relative %: 41 %
Platelet Count: 151 10*3/uL (ref 140–400)
RBC: 4.35 MIL/uL (ref 4.20–5.82)
RDW: 16.7 % — AB (ref 11.0–14.6)
WBC: 3.1 10*3/uL — AB (ref 4.0–10.3)

## 2017-05-20 LAB — RETICULOCYTES
RBC.: 4.35 MIL/uL (ref 4.20–5.82)
Retic Count, Absolute: 43.5 10*3/uL (ref 34.8–93.9)
Retic Ct Pct: 1 % (ref 0.8–1.8)

## 2017-05-20 LAB — CMP (CANCER CENTER ONLY)
ALK PHOS: 95 U/L (ref 40–150)
ALT: 18 U/L (ref 0–55)
AST: 16 U/L (ref 5–34)
Albumin: 3.9 g/dL (ref 3.5–5.0)
Anion gap: 6 (ref 3–11)
BUN: 20 mg/dL (ref 7–26)
CALCIUM: 9.1 mg/dL (ref 8.4–10.4)
CO2: 28 mmol/L (ref 22–29)
CREATININE: 1.78 mg/dL — AB (ref 0.70–1.30)
Chloride: 104 mmol/L (ref 98–109)
GFR, EST AFRICAN AMERICAN: 42 mL/min — AB (ref >60)
GFR, Estimated: 36 mL/min — ABNORMAL LOW (ref >60)
Glucose, Bld: 240 mg/dL — ABNORMAL HIGH (ref 70–140)
Potassium: 4 mmol/L (ref 3.5–5.1)
Sodium: 138 mmol/L (ref 136–145)
Total Bilirubin: 0.4 mg/dL (ref 0.2–1.2)
Total Protein: 7.7 g/dL (ref 6.4–8.3)

## 2017-05-20 NOTE — Telephone Encounter (Signed)
Appointment scheduled AVS/Calendar printed per 5/15 los

## 2017-05-21 LAB — KAPPA/LAMBDA LIGHT CHAINS
KAPPA, LAMDA LIGHT CHAIN RATIO: 1.94 — AB (ref 0.26–1.65)
Kappa free light chain: 49.7 mg/L — ABNORMAL HIGH (ref 3.3–19.4)
Lambda free light chains: 25.6 mg/L (ref 5.7–26.3)

## 2017-05-22 ENCOUNTER — Other Ambulatory Visit: Payer: Self-pay

## 2017-05-22 DIAGNOSIS — C9000 Multiple myeloma not having achieved remission: Secondary | ICD-10-CM

## 2017-05-22 MED ORDER — LENALIDOMIDE 10 MG PO CAPS: 10.0000 mg | ORAL_CAPSULE | Freq: Every day | ORAL | 0 refills | Status: DC

## 2017-05-25 LAB — MULTIPLE MYELOMA PANEL, SERUM
ALBUMIN/GLOB SERPL: 1.1 (ref 0.7–1.7)
ALPHA 1: 0.2 g/dL (ref 0.0–0.4)
ALPHA2 GLOB SERPL ELPH-MCNC: 0.7 g/dL (ref 0.4–1.0)
Albumin SerPl Elph-Mcnc: 3.7 g/dL (ref 2.9–4.4)
B-GLOBULIN SERPL ELPH-MCNC: 0.9 g/dL (ref 0.7–1.3)
GAMMA GLOB SERPL ELPH-MCNC: 1.5 g/dL (ref 0.4–1.8)
GLOBULIN, TOTAL: 3.4 g/dL (ref 2.2–3.9)
IGG (IMMUNOGLOBIN G), SERUM: 1567 mg/dL (ref 700–1600)
IGM (IMMUNOGLOBULIN M), SRM: 49 mg/dL (ref 15–143)
IgA: 262 mg/dL (ref 61–437)
Total Protein ELP: 7.1 g/dL (ref 6.0–8.5)

## 2017-06-05 ENCOUNTER — Telehealth: Payer: Self-pay

## 2017-06-05 NOTE — Telephone Encounter (Signed)
Returned phone call to [employer] [phone] exxt [phone] and spoke with financial advocate. Expecting fax to be sent to [phone] for [name] to sign to confirm assistance approval for medication ordered.

## 2017-06-19 ENCOUNTER — Other Ambulatory Visit: Payer: Self-pay

## 2017-06-19 DIAGNOSIS — C9000 Multiple myeloma not having achieved remission: Secondary | ICD-10-CM

## 2017-06-19 MED ORDER — LENALIDOMIDE 10 MG PO CAPS: 10.0000 mg | ORAL_CAPSULE | Freq: Every day | ORAL | 0 refills | Status: DC

## 2017-07-08 IMAGING — CT CT ABD-PELV W/ CM
2 of 5 series · 16 of 46 positions shown, 18 images · IV contrast (Omni 300)
Comparison: Abdominal radiograph May 11, 2016 and CT abdomen and
pelvis April 21, 2016

CLINICAL DATA: Constipation since yesterday, took MiraLax and now
has uncontrollable diarrhea and vomiting. Diffuse abdominal pain.
History of multiple myeloma, diabetes.

EXAM:
CT ABDOMEN AND PELVIS WITH CONTRAST
TECHNIQUE: Multidetector CT imaging of the abdomen and pelvis was performed
using the standard protocol following bolus administration of
intravenous contrast.
CONTRAST:  100mL [id number] IOPAMIDOL (BAYES9-4KK) INJECTION 61%

[Series 3: a/p w/ 5mm · axial · 0.77mm/px · z∈[+855,+1275]mm · 13 of 94 slices shown, 15 images]
[im 5/94  soft-tissue]
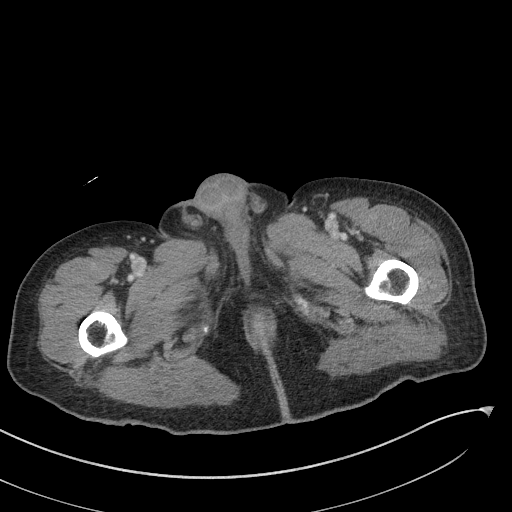
[im 5/94  bone]
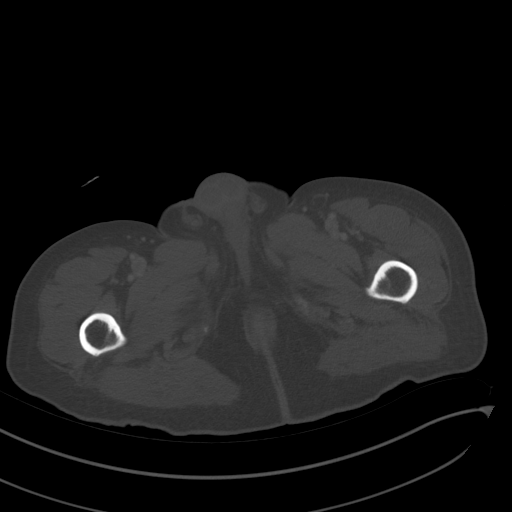
[im 15/94  soft-tissue]
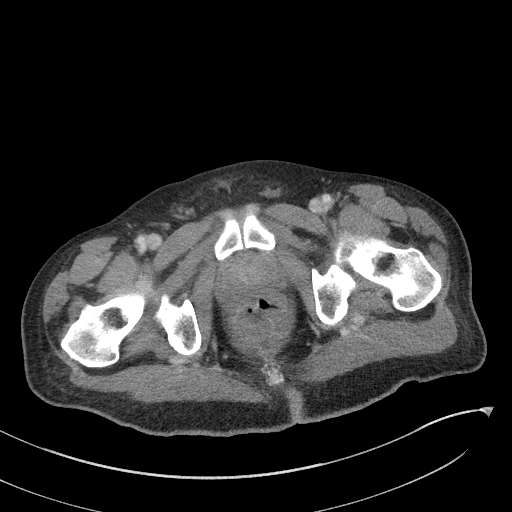
[im 20/94  soft-tissue]
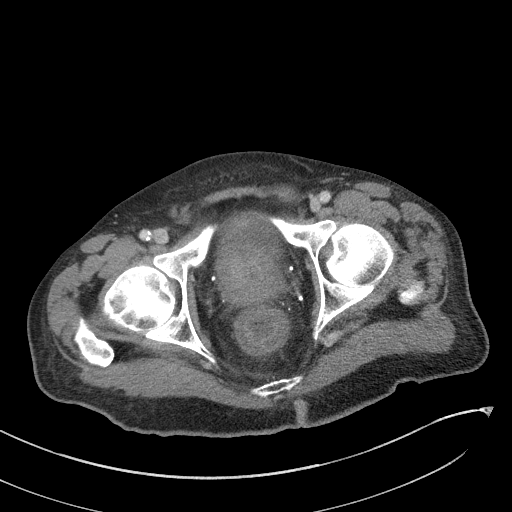
[im 25/94  soft-tissue]
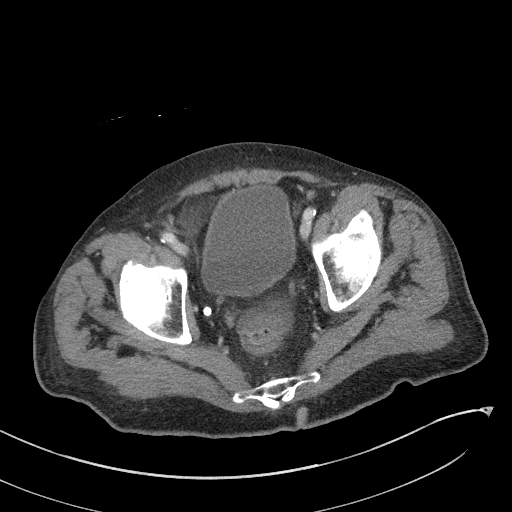
[im 35/94  soft-tissue]
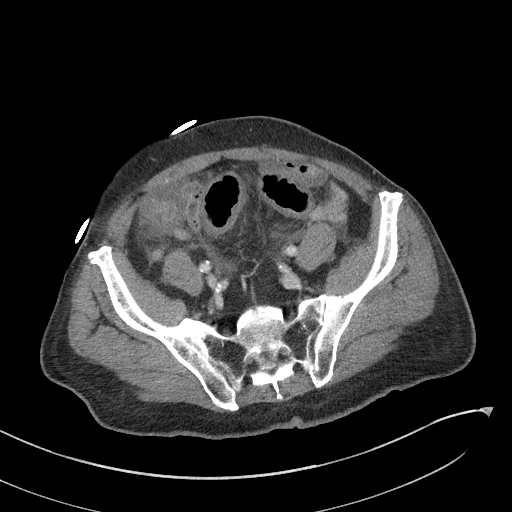
[im 40/94  soft-tissue]
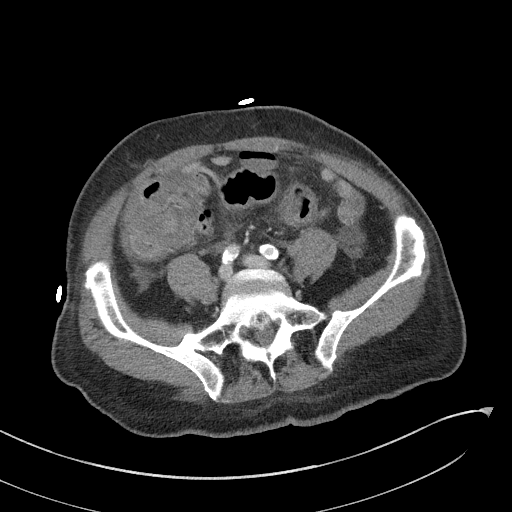
[im 49/94  soft-tissue]
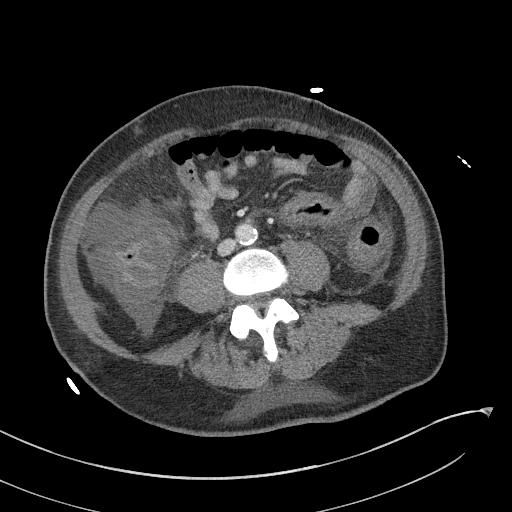
[im 54/94  soft-tissue]
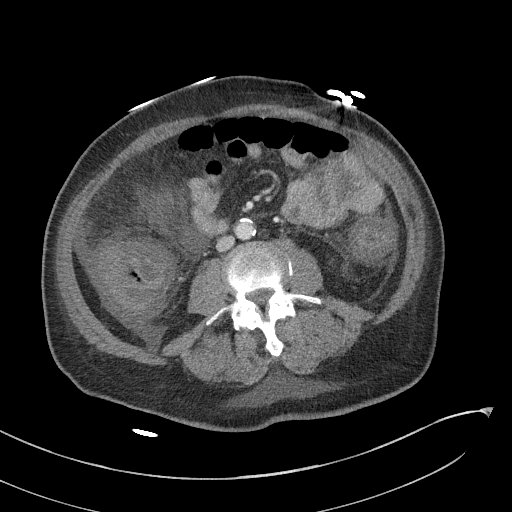
[im 59/94  soft-tissue]
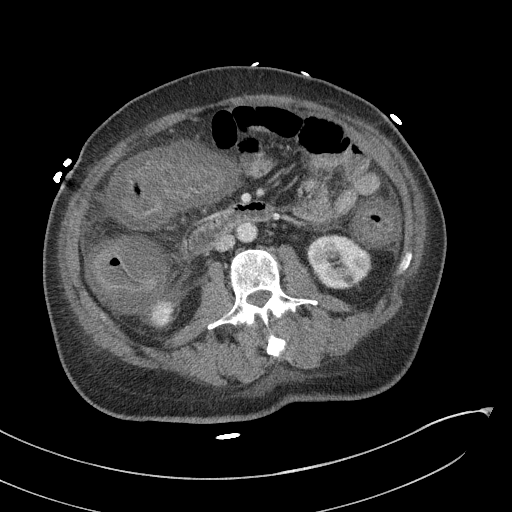
[im 59/94  bone]
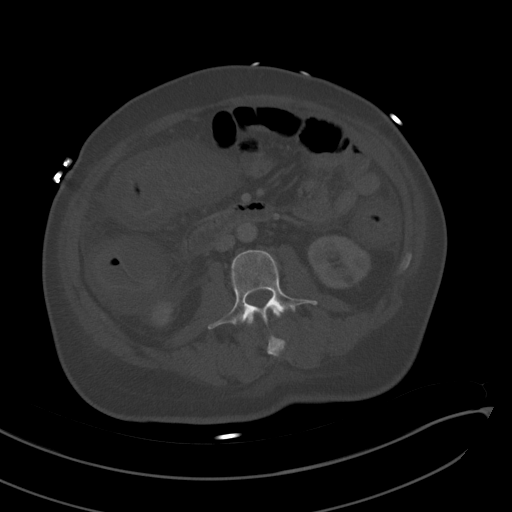
[im 69/94  soft-tissue]
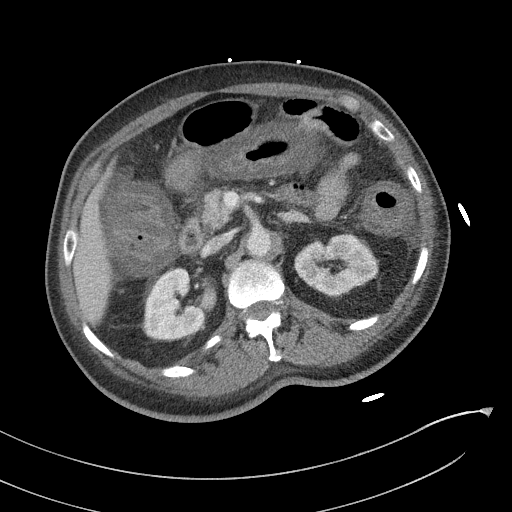
[im 74/94  soft-tissue]
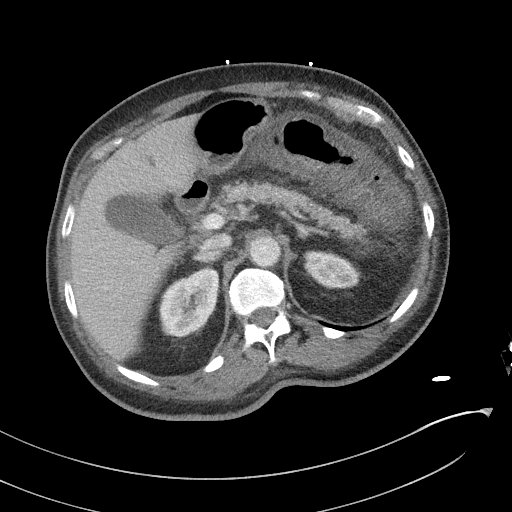
[im 79/94  soft-tissue]
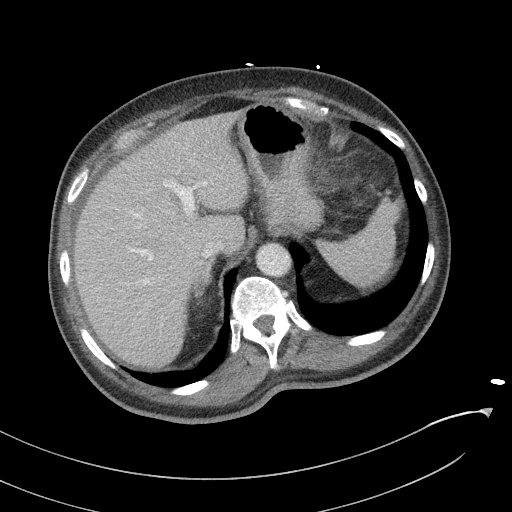
[im 89/94  soft-tissue]
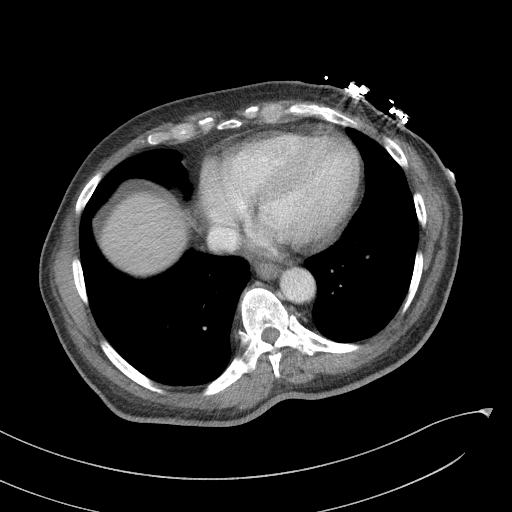

[Series 6: a/p w/ cor · coronal · 0.67mm/px · 3 of 151 slices shown]
[im 51/151  soft-tissue]
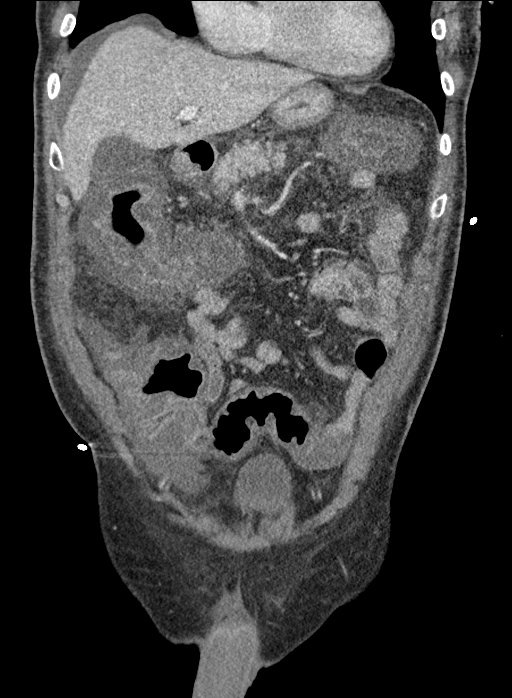
[im 67/151  soft-tissue]
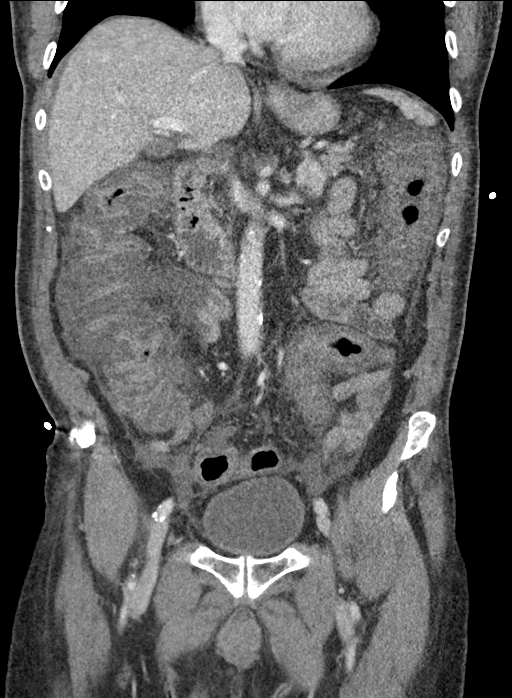
[im 84/151  soft-tissue]
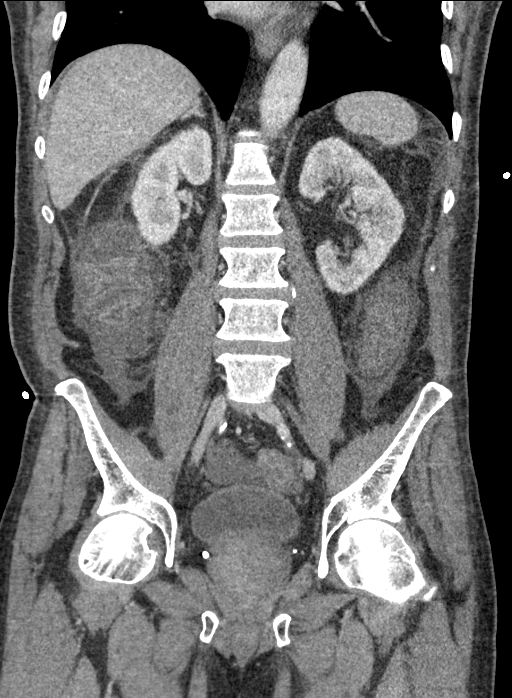

[16 of 46 positions shown; findings below may reference images not displayed]

FINDINGS: LOWER CHEST: Lung bases are clear. Included heart size is normal. No
pericardial effusion.

HEPATOBILIARY: Liver and gallbladder are normal.

PANCREAS: Normal.

SPLEEN: Normal.

ADRENALS/URINARY TRACT: Kidneys are orthotopic, demonstrating
symmetric enhancement. No nephrolithiasis, hydronephrosis or solid
renal masses. Too small to characterize hypodensities in the kidneys
bilaterally. The unopacified ureters are normal in course and
caliber. Delayed imaging through the kidneys demonstrates symmetric
prompt contrast excretion within the proximal urinary collecting
system. Urinary bladder is partially distended. Normal adrenal
glands.

STOMACH/BOWEL: Severe general colonic wall thickening and edema with
pericolonic inflammation. Very small hiatal hernia. The stomach,
small bowel are normal in course and caliber without inflammatory
changes. Normal appendix.

VASCULAR/LYMPHATIC: Aortoiliac vessels are normal in course and
caliber, mild to moderate calcific atherosclerosis. No
lymphadenopathy by CT size criteria.

REPRODUCTIVE: Prostatomegaly invading the base of the bladder.

OTHER: Small to moderate low-density free fluid without focal fluid
collection or intraperitoneal free air.

MUSCULOSKELETAL: Nonacute. Bridging RIGHT sacroiliac osteophyte.
Severe L5-S1 facet arthropathy.
IMPRESSION: Severe colitis, recommend correlation with stool sampling
(Clostridium difficile infection can have this appearance).

Small to moderate ascites.

## 2017-07-08 IMAGING — CR DG CHEST 2V
2 series · 2 of 2 positions shown · non-contrast
Comparison: Chest radiograph May 11, 2016

CLINICAL DATA: Diffuse abdominal pain and fever tonight, chest
pain.

EXAM:
CHEST  2 VIEW

[chest lat]
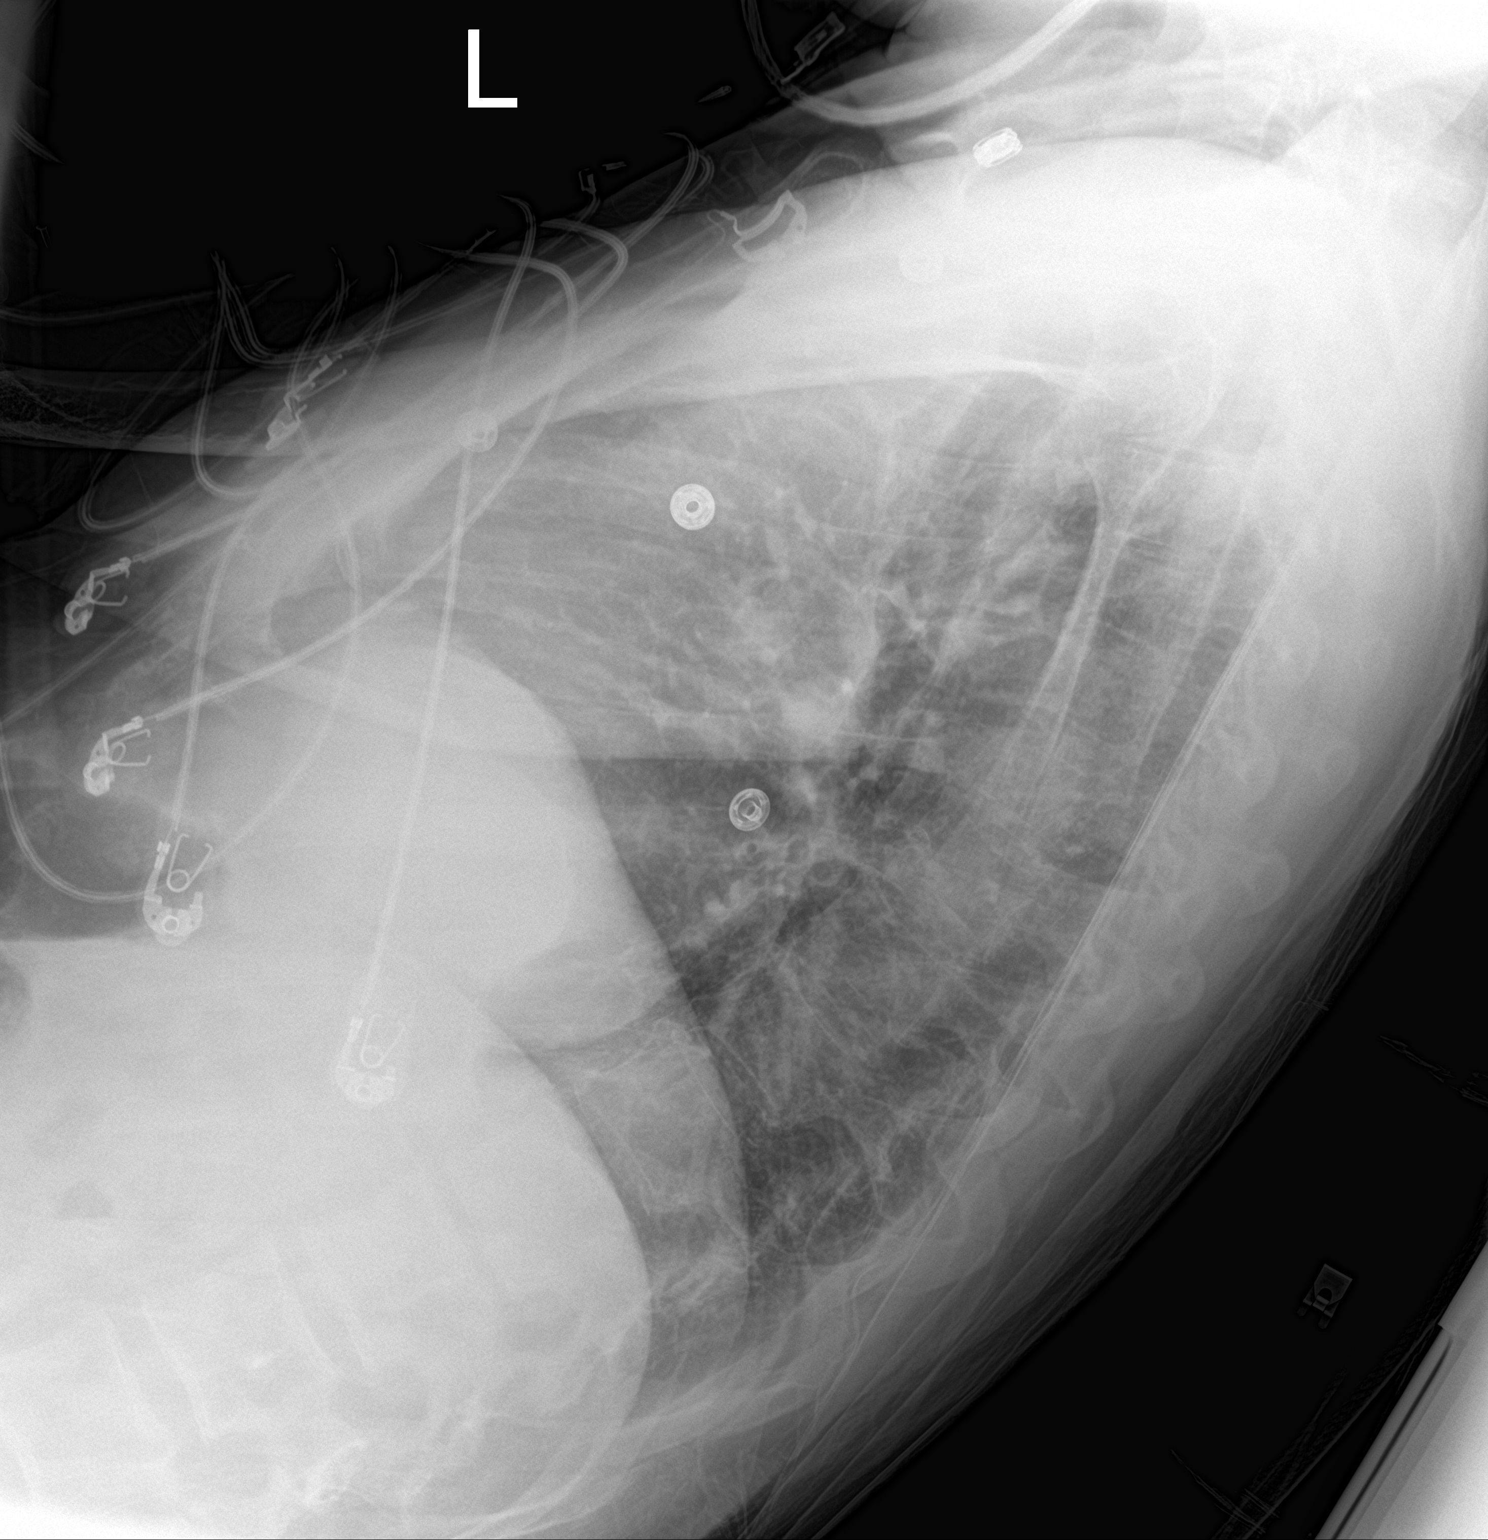

[chest ap]
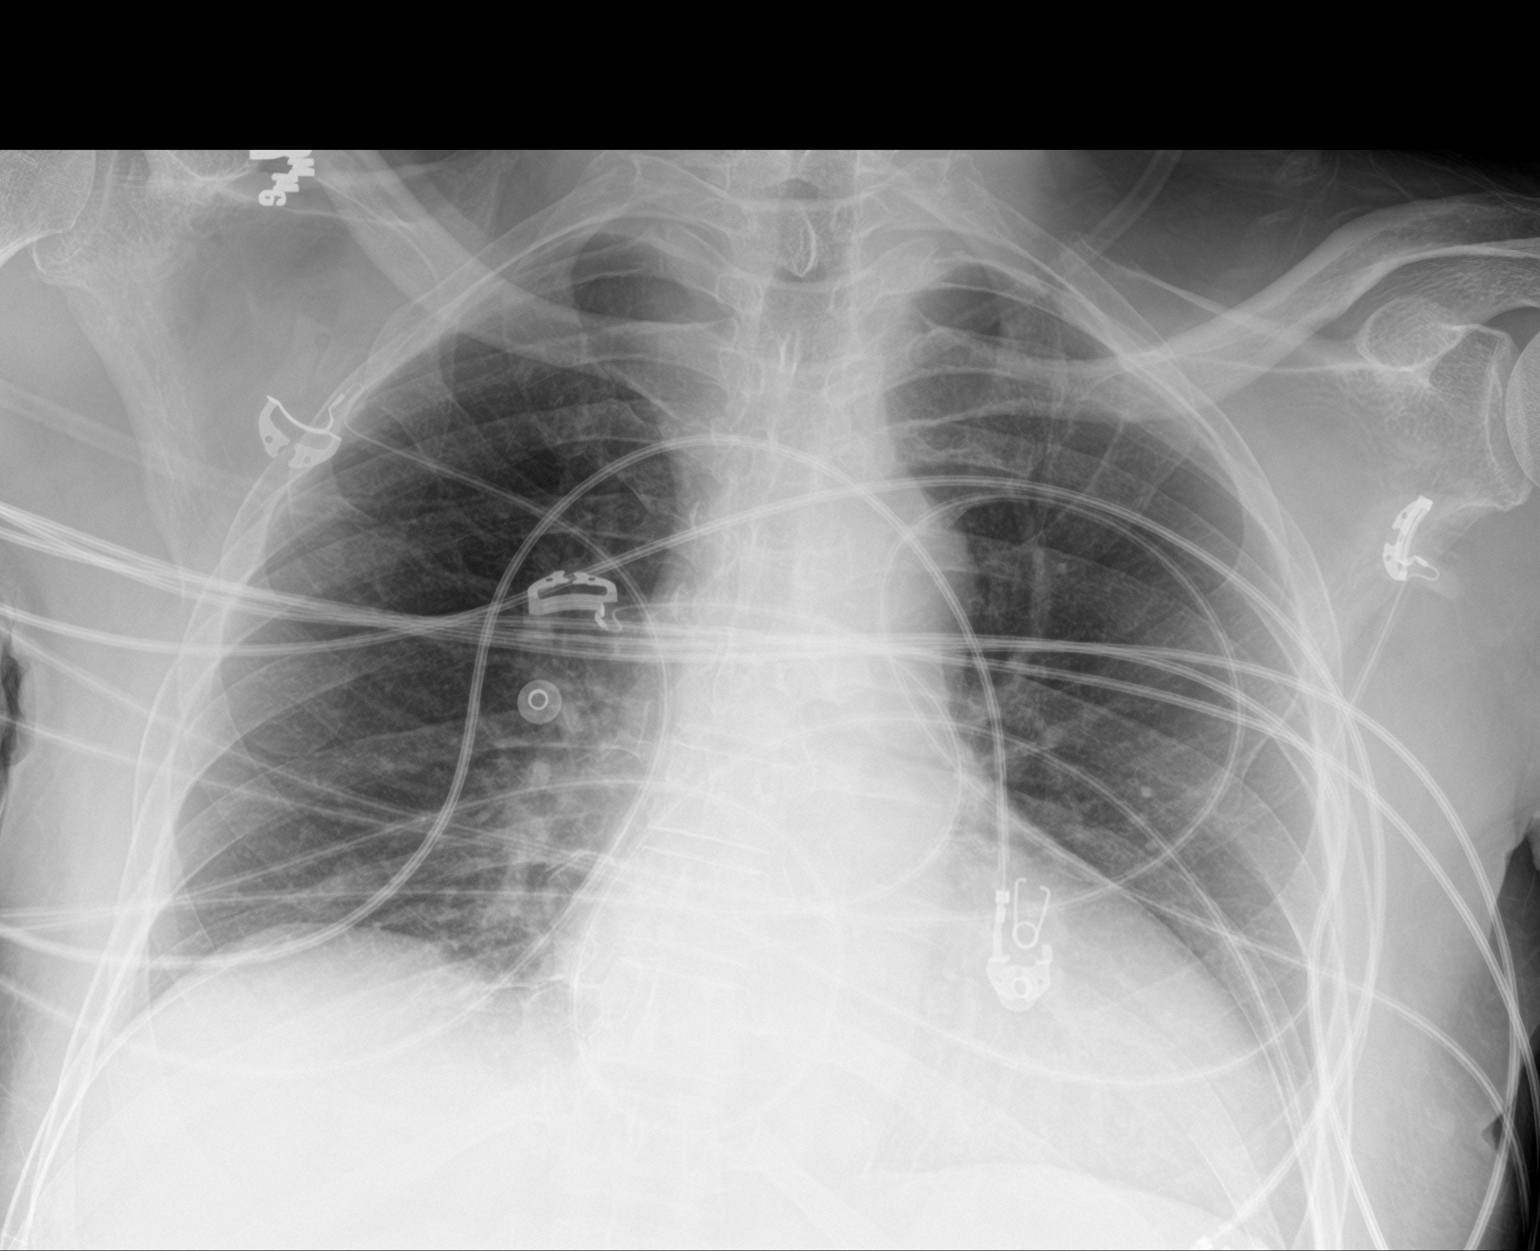

[2 of 2 positions shown; findings below may reference images not displayed]

FINDINGS: Cardiomediastinal silhouette is normal. No pleural effusions or
focal consolidations. Trachea projects midline and there is no
pneumothorax. Soft tissue planes and included osseous structures are
non-suspicious. Multiple EKG lines overlie the patient and may
obscure subtle underlying pathology.
IMPRESSION: Stable examination:  No acute cardiopulmonary process.

## 2017-07-15 ENCOUNTER — Other Ambulatory Visit: Payer: Self-pay

## 2017-07-15 DIAGNOSIS — C9000 Multiple myeloma not having achieved remission: Secondary | ICD-10-CM

## 2017-07-15 MED ORDER — LENALIDOMIDE 10 MG PO CAPS: 10.0000 mg | ORAL_CAPSULE | Freq: Every day | ORAL | 0 refills | Status: DC

## 2017-07-20 NOTE — Progress Notes (Signed)
Douglas Rose  HEMATOLOGY ONCOLOGY PROGRESS NOTE  Date of service: 07/21/17  Patient Care Team: [name], MD as PCP - General (Family Medicine)  CC: f/u for multiple myeloma  Diagnosis:  IgG Kappa multiple myeloma   Current Treatment:  Revlimid maintenance   INTERVAL HISTORY:  Douglas Rose and his wife are here for follow-up regarding his maintenance Revlimid treatment for his multiple myeloma. The patient's last visit with Douglas Rose was on 05/20/17. He is accompanied today by his wife. The pt reports that he is doing well overall.   The pt reports that he began his new cycle on 07/19/17. He notes tolerating his Revlimid well. He also notes that his peripheral neuropathy is improving and he continues to take 326m Gabapentin BID. He continues to take Lopressor, Lisinopril, Amlodipine, and aspirin. He notes that his BP and blood sugars at home have both been on average in the 140s.   His previously noted small nodule in his right neck has not changed in size or character.   Lab results today (07/21/17) of CBC w/diff and Reticulocytes is as follows: all values are WNL except for WBC at 3.1k, RBC at 3.88, HGB at 10.8, HCT at 32.7, RDW at 17.2, ANC at 1400. CMP 07/21/17 is reviewed SPEP 07/21/17 is no M spike and neg IFE Kappa/Lambda 07/21/17 is pending  On review of systems, pt reports good energy levels, resolving peripheral neuropathy, stable right neck nodule, and denies skin rash, diarrhea, constipation, dizziness, light headedness, new bone pains, leg swelling, abdominal pains, and any other symptoms.    REVIEW OF SYSTEMS:   A 10+ POINT REVIEW OF SYSTEMS WAS OBTAINED including neurology, dermatology, psychiatry, cardiac, respiratory, lymph, extremities, GI, GU, Musculoskeletal, constitutional, breasts, reproductive, HEENT.  All pertinent positives are noted in the HPI.  All others are negative.   Past Medical History:  Diagnosis Date  . CAD (coronary artery disease) 2006   stent LAD 3.0x18  [place], occluded RCA  . [place]    multiple myeloma  . Diabetes mellitus without complication ([place]   . H/O non-insulin dependent diabetes mellitus   . Hyperlipidemia   . Hypertension     . Past Surgical History:  Procedure Laterality Date  . CARDIAC CATHETERIZATION  2006  . CORONARY ANGIOPLASTY WITH STENT PLACEMENT  2007  . IR FLUORO GUIDE CV LINE RIGHT  05/20/2016  . IR [place] VASC ACCESS RIGHT  05/20/2016    . Social History   Tobacco Use  . Smoking status: Never Smoker  . Smokeless tobacco: Never Used  Substance Use Topics  . Alcohol use: No  . Drug use: No    ALLERGIES:  has No Known Allergies.  MEDICATIONS:  Current Outpatient Medications  Medication Sig Dispense Refill  . acyclovir (ZOVIRAX) 200 MG capsule Take 1 capsule (200 mg total) by mouth 2 (two) times daily. (Patient taking differently: Take 200 mg by mouth daily. ) 30 capsule 3  . acyclovir (ZOVIRAX) 200 MG capsule TAKE 1 CAPSULE BY MOUTH TWICE DAILY 90 capsule 1  . amLODipine (NORVASC) 10 MG tablet Take 10 mg by mouth daily.    .Douglas Kitchenaspirin 81 MG tablet Take 81 mg by mouth daily.    .Douglas Kitchenatorvastatin (LIPITOR) 20 MG tablet Take 1 tablet (20 mg total) by mouth daily. 90 tablet 3  . Blood Glucose Monitoring Suppl (ACCU-CHEK AVIVA PLUS) w/Device KIT by Does not apply route.    . gabapentin (NEURONTIN) 300 MG capsule Take 300 mg by mouth daily after supper.    .Douglas Rose  Insulin Glargine (LANTUS SOLOSTAR) 100 UNIT/ML Solostar Pen Inject 20 Units into the skin daily. (Patient taking differently: Inject 12 Units into the skin daily. ) 15 mL 11  . lenalidomide (REVLIMID) 10 MG capsule Take 1 capsule (10 mg total) by mouth daily. Take for 21 days then 7 days off. Take with water. Auth# [id number] 07/15/17 21 capsule 0  . metoprolol tartrate (LOPRESSOR) 100 MG tablet Take 100 mg by mouth once.    . nitroGLYCERIN (NITROSTAT) 0.4 MG SL tablet Place 1 tablet (0.4 mg total) under the tongue every 5 (five) minutes x 3 doses as needed  for chest pain. 30 tablet 12  . RELION PEN NEEDLES 32G X 4 MM MISC     . vitamin B-12 (CYANOCOBALAMIN) 1000 MCG tablet Take 1,000 mcg by mouth daily.     No current facility-administered medications for this visit.     PHYSICAL EXAMINATION: ECOG PERFORMANCE STATUS: 1 - Symptomatic but completely ambulatory  Vitals:   07/21/17 1446  BP: (!) 148/83  Pulse: 63  Resp: 18  Temp: 97.7 F (36.5 C)  SpO2: 100%    Filed Weights   07/21/17 1446  Weight: 173 lb (78.5 kg)   .Body mass index is 24.13 kg/m.  GENERAL:alert, in no acute distress and comfortable SKIN: no acute rashes, no significant lesions EYES: conjunctiva are pink and non-injected, sclera anicteric OROPHARYNX: MMM, no exudates, no oropharyngeal erythema or ulceration NECK: supple, no JVD LYMPH:  unchanged right cervical LN, no palpable lymphadenopathy in the , axillary or inguinal regions LUNGS: clear to auscultation b/l with normal respiratory effort HEART: regular rate & rhythm ABDOMEN:  normoactive bowel sounds , non tender, not distended. No palpable hepatosplenomegaly.  Extremity: no pedal edema PSYCH: alert & oriented x 3 with fluent speech NEURO: no focal motor/sensory deficits   LABORATORY DATA:   I have reviewed the data as listed  . CBC Latest Ref Rng & Units 07/21/2017 05/20/2017 02/25/2017  WBC 4.0 - 10.3 K/uL 3.1(L) 3.1(L) 5.0  Hemoglobin 13.0 - 17.1 g/dL 10.8(L) 12.2(L) 11.9(L)  Hematocrit 38.4 - 49.9 % 32.7(L) 36.9(L) 37.0(L)  Platelets 140 - 400 K/uL 198 151 176  ANC 1.3k  . CMP Latest Ref Rng & Units 07/21/2017 05/20/2017 02/25/2017  Glucose 70 - 99 mg/dL 150(H) 240(H) 211(H)  BUN 8 - 23 mg/dL 19 20 19   Creatinine 0.61 - 1.24 mg/dL 1.67(H) 1.78(H) 1.75(H)  Sodium 135 - 145 mmol/L 139 138 140  Potassium 3.5 - 5.1 mmol/L 3.4(L) 4.0 3.6  Chloride 98 - 111 mmol/L 107 104 103  CO2 22 - 32 mmol/L 27 28 26   Calcium 8.9 - 10.3 mg/dL 8.7(L) 9.1 9.5  Total Protein 6.5 - 8.1 g/dL 7.0 7.7 7.6  Total  Bilirubin 0.3 - 1.2 mg/dL 0.3 0.4 0.4  Alkaline Phos 38 - 126 U/L 98 95 96  AST 15 - 41 U/L 17 16 16   ALT 0 - 44 U/L 15 18 23            RADIOGRAPHIC STUDIES: I have personally reviewed the radiological images as listed and agreed with the findings in the report. No results found.  ASSESSMENT & PLAN:   72 y.o.  with multiple medical comorbidities but fairly good performance status overall with   1) ISS Stage II IgG Kapppa multiple myeloma. Anemia + Renal insufficiency. Bone survey neg for concerning bone lesions. significantly elevated kappafree light chains 858 mg percent, lambda 18.4, with a ratio of 46.65 . He was also noted to have  an M spike of 1.7 g/dL with IFE showing IgG kappa monoclonal protein. Quantitative IgG level was increased to nearly 2500 mg/dL. 24-hour UPEP showed monoclonal protein of 1300 mg per 24 hours which constituted about 86% of all the urinary protein. Bone marrow biopsy showed 20-30% Restricted plasma cells consistent with multiple myeloma Cytogenetics/FISH  Showed   Patient has been treated with CyBord X 2 cycles . Treatment interrupted due to severe c diff colitis with prolonged hospitalization and decline in performance status.  Patient is much improved and back to baseline now. 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 Had an element of obstructive uropathy due to BPH which has resolved. Creatinine continues to improve from 3.5 to 2.9 to 1.9 to 1.6. Stable 1.6-1.8  3) Normocytic anemia likely related to multiple myeloma.-stable, improved. hgb 11.9 No abdominal pain or discomfort over the right upper quadrant. No fevers or chills. PLAN:  -Stay well hydrated, avoid caffeine and ETOH, use compression socks, move every hour while on long distance flight to [place].  -Continue follow-up with nephrology- patient is following  with Dr Ila Mcgill. -Pt previously decided to not get a transplant after meeting with Dr Maylene Roes at [facility] on 01/28/17 -Discussed pt labwork today, 07/21/17; mild anemia with HGB at 10.8, blood counts are stable otherwise. Myeloma panel neg Mspike nl IFE -- continues to be in remission -The pt has no prohibitive toxicities from continuing 75m Revlimid at this time, 21 days on, 7 days off - continue -Continue age-appropriate cancer screening -Will see pt back in 2 months   4) 5) DM2 - better controled -continue f/u with PCP.   RTC with Dr KIrene Limboin 2 months with labs plz schedule the lab appointment 1 week prior to clinic visit    The total time spent in the appt was 25 minutes and more than 50% was on counseling and direct patient cares.    GSullivan LoneMD MPark CityAAHIVMS SEndoscopy Center Of Connecticut LLCCRenaissance Asc LLCHematology/Oncology Physician CErlanger North Hospital (Office):       [phone](Work cell):  [phone](Fax):           [phone] I, SBaldwin Jamaica am acting as a scribe for Dr KIrene Limbo   .I have reviewed the above documentation for accuracy and completeness, and I agree with the above. .Brunetta GeneraMD

## 2017-07-21 ENCOUNTER — Other Ambulatory Visit: Payer: Self-pay | Admitting: Hematology

## 2017-07-21 ENCOUNTER — Inpatient Hospital Stay (HOSPITAL_BASED_OUTPATIENT_CLINIC_OR_DEPARTMENT_OTHER): Payer: Medicare HMO | Admitting: Hematology

## 2017-07-21 ENCOUNTER — Inpatient Hospital Stay: Payer: Medicare HMO | Attending: Hematology

## 2017-07-21 ENCOUNTER — Telehealth: Payer: Self-pay | Admitting: Hematology

## 2017-07-21 VITALS — BP 148/83 | HR 63 | Temp 97.7°F | Resp 18 | Ht 71.0 in | Wt 173.0 lb

## 2017-07-21 DIAGNOSIS — I10 Essential (primary) hypertension: Secondary | ICD-10-CM | POA: Diagnosis not present

## 2017-07-21 DIAGNOSIS — D649 Anemia, unspecified: Secondary | ICD-10-CM | POA: Insufficient documentation

## 2017-07-21 DIAGNOSIS — C9001 Multiple myeloma in remission: Secondary | ICD-10-CM

## 2017-07-21 DIAGNOSIS — C9000 Multiple myeloma not having achieved remission: Secondary | ICD-10-CM | POA: Insufficient documentation

## 2017-07-21 DIAGNOSIS — E119 Type 2 diabetes mellitus without complications: Secondary | ICD-10-CM | POA: Insufficient documentation

## 2017-07-21 DIAGNOSIS — N08 Glomerular disorders in diseases classified elsewhere: Secondary | ICD-10-CM | POA: Diagnosis not present

## 2017-07-21 DIAGNOSIS — Z79899 Other long term (current) drug therapy: Secondary | ICD-10-CM | POA: Diagnosis not present

## 2017-07-21 DIAGNOSIS — N289 Disorder of kidney and ureter, unspecified: Secondary | ICD-10-CM | POA: Diagnosis not present

## 2017-07-21 LAB — CMP (CANCER CENTER ONLY)
ALBUMIN: 3.5 g/dL (ref 3.5–5.0)
ALT: 15 U/L (ref 0–44)
AST: 17 U/L (ref 15–41)
Alkaline Phosphatase: 98 U/L (ref 38–126)
Anion gap: 5 (ref 5–15)
BUN: 19 mg/dL (ref 8–23)
CHLORIDE: 107 mmol/L (ref 98–111)
CO2: 27 mmol/L (ref 22–32)
CREATININE: 1.67 mg/dL — AB (ref 0.61–1.24)
Calcium: 8.7 mg/dL — ABNORMAL LOW (ref 8.9–10.3)
GFR, EST NON AFRICAN AMERICAN: 39 mL/min — AB (ref >60)
GFR, Est AFR Am: 45 mL/min — ABNORMAL LOW (ref >60)
GLUCOSE: 150 mg/dL — AB (ref 70–99)
POTASSIUM: 3.4 mmol/L — AB (ref 3.5–5.1)
Sodium: 139 mmol/L (ref 135–145)
Total Bilirubin: 0.3 mg/dL (ref 0.3–1.2)
Total Protein: 7 g/dL (ref 6.5–8.1)

## 2017-07-21 LAB — RETICULOCYTES
RBC.: 3.88 MIL/uL — AB (ref 4.20–5.82)
Retic Count, Absolute: 42.7 10*3/uL (ref 34.8–93.9)
Retic Ct Pct: 1.1 % (ref 0.8–1.8)

## 2017-07-21 LAB — CBC WITH DIFFERENTIAL/PLATELET
BASOS ABS: 0 10*3/uL (ref 0.0–0.1)
Basophils Relative: 1 %
Eosinophils Absolute: 0.2 10*3/uL (ref 0.0–0.5)
Eosinophils Relative: 7 %
HEMATOCRIT: 32.7 % — AB (ref 38.4–49.9)
HEMOGLOBIN: 10.8 g/dL — AB (ref 13.0–17.1)
LYMPHS PCT: 31 %
Lymphs Abs: 1 10*3/uL (ref 0.9–3.3)
MCH: 27.8 pg (ref 27.2–33.4)
MCHC: 33 g/dL (ref 32.0–36.0)
MCV: 84.3 fL (ref 79.3–98.0)
MONO ABS: 0.5 10*3/uL (ref 0.1–0.9)
MONOS PCT: 16 %
NEUTROS ABS: 1.4 10*3/uL — AB (ref 1.5–6.5)
NEUTROS PCT: 45 %
Platelets: 198 10*3/uL (ref 140–400)
RBC: 3.88 MIL/uL — ABNORMAL LOW (ref 4.20–5.82)
RDW: 17.2 % — AB (ref 11.0–14.6)
WBC: 3.1 10*3/uL — ABNORMAL LOW (ref 4.0–10.3)

## 2017-07-21 NOTE — Telephone Encounter (Signed)
Scheduled appt per 7/16 los - gave patient AVS and calender per los.  

## 2017-07-22 LAB — MULTIPLE MYELOMA PANEL, SERUM
ALBUMIN/GLOB SERPL: 1 (ref 0.7–1.7)
ALPHA 1: 0.2 g/dL (ref 0.0–0.4)
ALPHA2 GLOB SERPL ELPH-MCNC: 0.7 g/dL (ref 0.4–1.0)
Albumin SerPl Elph-Mcnc: 3.1 g/dL (ref 2.9–4.4)
B-Globulin SerPl Elph-Mcnc: 0.9 g/dL (ref 0.7–1.3)
Gamma Glob SerPl Elph-Mcnc: 1.5 g/dL (ref 0.4–1.8)
Globulin, Total: 3.3 g/dL (ref 2.2–3.9)
IGG (IMMUNOGLOBIN G), SERUM: 1413 mg/dL (ref 700–1600)
IGM (IMMUNOGLOBULIN M), SRM: 40 mg/dL (ref 15–143)
IgA: 258 mg/dL (ref 61–437)
Total Protein ELP: 6.4 g/dL (ref 6.0–8.5)

## 2017-07-22 LAB — KAPPA/LAMBDA LIGHT CHAINS
KAPPA FREE LGHT CHN: 43.1 mg/L — AB (ref 3.3–19.4)
Kappa, lambda light chain ratio: 2.26 — ABNORMAL HIGH (ref 0.26–1.65)
Lambda free light chains: 19.1 mg/L (ref 5.7–26.3)

## 2017-07-26 ENCOUNTER — Other Ambulatory Visit: Payer: Self-pay | Admitting: Hematology

## 2017-08-03 ENCOUNTER — Other Ambulatory Visit: Payer: Self-pay | Admitting: *Deleted

## 2017-08-03 DIAGNOSIS — C9000 Multiple myeloma not having achieved remission: Secondary | ICD-10-CM

## 2017-08-03 MED ORDER — ACYCLOVIR 200 MG PO CAPS: 200.0000 mg | ORAL_CAPSULE | Freq: Two times a day (BID) | ORAL | 1 refills | Status: DC

## 2017-08-07 ENCOUNTER — Other Ambulatory Visit: Payer: Self-pay

## 2017-08-07 DIAGNOSIS — C9000 Multiple myeloma not having achieved remission: Secondary | ICD-10-CM

## 2017-08-07 MED ORDER — LENALIDOMIDE 10 MG PO CAPS: 10.0000 mg | ORAL_CAPSULE | Freq: Every day | ORAL | 0 refills | Status: DC

## 2017-09-08 ENCOUNTER — Other Ambulatory Visit: Payer: Self-pay

## 2017-09-08 DIAGNOSIS — C9000 Multiple myeloma not having achieved remission: Secondary | ICD-10-CM

## 2017-09-08 MED ORDER — LENALIDOMIDE 10 MG PO CAPS: 10.0000 mg | ORAL_CAPSULE | Freq: Every day | ORAL | 0 refills | Status: DC

## 2017-09-15 ENCOUNTER — Inpatient Hospital Stay: Payer: Medicare HMO

## 2017-09-15 ENCOUNTER — Telehealth: Payer: Self-pay | Admitting: Hematology

## 2017-09-15 NOTE — Telephone Encounter (Signed)
Returned pts call to r/s appts   °

## 2017-09-22 ENCOUNTER — Ambulatory Visit: Payer: Medicare HMO | Admitting: Hematology

## 2017-09-22 ENCOUNTER — Other Ambulatory Visit: Payer: Medicare HMO

## 2017-09-28 ENCOUNTER — Telehealth: Payer: Self-pay

## 2017-09-28 NOTE — Telephone Encounter (Signed)
Pt called regarding confirmation of appts for Wednesday (9/25). Confirmed 2pm lab work and 2:40pm MD appt with [name]. Pt verbalized thanks for the information.

## 2017-09-29 ENCOUNTER — Ambulatory Visit: Payer: Medicare HMO | Admitting: Hematology

## 2017-09-29 NOTE — Progress Notes (Signed)
Douglas Rose  HEMATOLOGY ONCOLOGY PROGRESS NOTE  Date of service: 09/30/17  Patient Care Team: [name], MD as PCP - General (Family Medicine)  CC: f/u for multiple myeloma  Diagnosis:  IgG Kappa multiple myeloma   Current Treatment:  Revlimid maintenance   INTERVAL HISTORY:  Douglas Rose presents today for management and evaluation of his maintenance Revlimid treatment for his multiple myeloma. The patient's last visit with [name] was on 07/21/17. He is accompanied today by his wife. The pt reports that he is doing well overall.   The pt reports that he has not had any new concerns in the interim. He continues with Revlimid and aspirin and denies any concerns for infections. He notes that his neuropathy in his feet has been stable and is taking 671m Gabapentin each night.   Lab results today (09/30/17) of CBC w/diff, CMP, and Reticulocytes is as follows: all values are WNL except for WBC at 3.4k, RBC at 3.94, HGB at 11.1, HCT at 34.2, RDW at 17.0, PLT at 131k, Glucose at 185, Creatinine at 1.82, AST at 14, GFR at 41. 09/30/17 MMP is pending   On review of systems, pt reports stable energy levels, stable neuropathy, and denies concerns for infections, fevers, chills, night sweats, new bone pains, leg swelling, no new lumps or bumps, and any other symptoms.    REVIEW OF SYSTEMS:    A 10+ POINT REVIEW OF SYSTEMS WAS OBTAINED including neurology, dermatology, psychiatry, cardiac, respiratory, lymph, extremities, GI, GU, Musculoskeletal, constitutional, breasts, reproductive, HEENT.  All pertinent positives are noted in the HPI.  All others are negative.   Past Medical History:  Diagnosis Date  . CAD (coronary artery disease) 2006   stent LAD 3.0x18 [place], occluded RCA  . [place]    [place]  . [place] complication [place]   . H/O non-insulin dependent diabetes mellitus   . Hyperlipidemia   . Hypertension     . Past Surgical History:  Procedure Laterality  Date  . CARDIAC CATHETERIZATION  2006  . CORONARY ANGIOPLASTY WITH STENT PLACEMENT  2007  . IR FLUORO GUIDE CV LINE RIGHT  05/20/2016  . IR [place] VASC ACCESS RIGHT  05/20/2016    . Social History   Tobacco Use  . Smoking status: Never Smoker  . Smokeless tobacco: Never Used  Substance Use Topics  . Alcohol use: No  . Drug use: No    ALLERGIES:  has No Known Allergies.  MEDICATIONS:  Current Outpatient Medications  Medication Sig Dispense Refill  . acyclovir (ZOVIRAX) 200 MG capsule Take 1 capsule (200 mg total) by mouth 2 (two) times daily. 90 capsule 1  . amLODipine (NORVASC) 10 MG tablet Take 10 mg by mouth daily.    .Douglas Kitchenaspirin 81 MG tablet Take 81 mg by mouth daily.    .Douglas Kitchenatorvastatin (LIPITOR) 20 MG tablet Take 1 tablet (20 mg total) by mouth daily. 90 tablet 3  . Blood Glucose Monitoring Suppl (ACCU-CHEK AVIVA PLUS) w/Device KIT by Does not apply route.    . gabapentin (NEURONTIN) 300 MG capsule Take 300 mg by mouth daily after supper.    . Insulin Glargine (LANTUS SOLOSTAR) 100 UNIT/ML Solostar Pen Inject 20 Units into the skin daily. (Patient taking differently: Inject 12 Units into the skin daily. ) 15 mL 11  . lenalidomide (REVLIMID) 10 MG capsule Take 1 capsule (10 mg total) by mouth daily. Take for 21 days then 7 days off. Take with water. Auth# [id number]  21 capsule 0  . lisinopril (PRINIVIL,ZESTRIL) 2.5 MG tablet Take 2.5 mg by mouth daily.    . metoprolol tartrate (LOPRESSOR) 100 MG tablet Take 100 mg by mouth once.    . nitroGLYCERIN (NITROSTAT) 0.4 MG SL tablet Place 1 tablet (0.4 mg total) under the tongue every 5 (five) minutes x 3 doses as needed for chest pain. 30 tablet 12  . RELION PEN NEEDLES 32G X 4 MM MISC     . vitamin B-12 (CYANOCOBALAMIN) 1000 MCG tablet Take 1,000 mcg by mouth daily.     No current facility-administered medications for this visit.     PHYSICAL EXAMINATION: ECOG PERFORMANCE STATUS: 1 - Symptomatic but completely  ambulatory  Vitals:   09/30/17 1508  BP: (!) 159/81  Pulse: (!) 58  Resp: 12  Temp: 98 F (36.7 C)  SpO2: 98%    Filed Weights   09/30/17 1508  Weight: 174 lb 3.2 oz (79 kg)   .Body mass index is 24.3 kg/m.  GENERAL:alert, in no acute distress and comfortable SKIN: no acute rashes, no significant lesions EYES: conjunctiva are pink and non-injected, sclera anicteric OROPHARYNX: MMM, no exudates, no oropharyngeal erythema or ulceration NECK: supple, no JVD LYMPH:  Unchanged right cervical LN, no palpable lymphadenopathy in the axillary or inguinal regions LUNGS: clear to auscultation b/l with normal respiratory effort HEART: regular rate & rhythm ABDOMEN:  normoactive bowel sounds , non tender, not distended. No palpable hepatosplenomegaly.  Extremity: no pedal edema PSYCH: alert & oriented x 3 with fluent speech NEURO: no focal motor/sensory deficits   LABORATORY DATA:   I have reviewed the data as listed  . CBC Latest Ref Rng & Units 09/30/2017 07/21/2017 05/20/2017  WBC 4.0 - 10.3 K/uL 3.4(L) 3.1(L) 3.1(L)  Hemoglobin 13.0 - 17.1 g/dL 11.1(L) 10.8(L) 12.2(L)  Hematocrit 38.4 - 49.9 % 34.2(L) 32.7(L) 36.9(L)  Platelets 140 - 400 K/uL 131(L) 198 151  ANC 1.7k  . CMP Latest Ref Rng & Units 09/30/2017 07/21/2017 05/20/2017  Glucose 70 - 99 mg/dL 185(H) 150(H) 240(H)  BUN 8 - 23 mg/dL 21 19 20   Creatinine 0.61 - 1.24 mg/dL 1.82(H) 1.67(H) 1.78(H)  Sodium 135 - 145 mmol/L 139 139 138  Potassium 3.5 - 5.1 mmol/L 4.0 3.4(L) 4.0  Chloride 98 - 111 mmol/L 106 107 104  CO2 22 - 32 mmol/L 26 27 28   Calcium 8.9 - 10.3 mg/dL 8.9 8.7(L) 9.1  Total Protein 6.5 - 8.1 g/dL 7.2 7.0 7.7  Total Bilirubin 0.3 - 1.2 mg/dL 0.3 0.3 0.4  Alkaline Phos 38 - 126 U/L 99 98 95  AST 15 - 41 U/L 14(L) 17 16  ALT 0 - 44 U/L 17 15 18            RADIOGRAPHIC STUDIES: I have personally reviewed the radiological images as listed and agreed with the findings in the report. No results  found.  ASSESSMENT & PLAN:   72 y.o.  with multiple medical comorbidities but fairly good performance status overall with   1) ISS Stage II IgG Kapppa multiple myeloma. Anemia + Renal insufficiency. Bone survey neg for concerning bone lesions. significantly elevated kappafree light chains 858 mg percent, lambda 18.4, with a ratio of 46.65 . He was also noted to have an M spike of 1.7 g/dL with IFE showing IgG kappa monoclonal protein. Quantitative IgG level was increased to nearly 2500 mg/dL. 24-hour UPEP showed monoclonal protein of 1300 mg per 24 hours which constituted about 86% of all the urinary protein.  Bone marrow biopsy showed 20-30% Restricted plasma cells consistent with multiple myeloma Cytogenetics/FISH  Showed   Patient has been treated with CyBord X 2 cycles . Treatment interrupted due to severe c diff colitis with prolonged hospitalization and decline in performance status.  Patient is much improved and back to baseline now. 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 Had an element of obstructive uropathy due to BPH which has resolved. Creatinine continues to improve from 3.5 to 2.9 to 1.9 to 1.6. Stable 1.6-1.8  3) Normocytic anemia likely related to multiple myeloma.-stable, improved. hgb 11.9 No abdominal pain or discomfort over the right upper quadrant. No fevers or chills.  PLAN:   -Discussed pt labwork today, 09/30/17; blood counts and chemistries are stable  -09/30/17 MMP is pending and will follow up with pt with results -The pt has no prohibitive toxicities from continuing maintenance 66m Revlimid, 21 days on and 7 days off, at this time.   -Continue 871maspirin -Stay well hydrated, avoid caffeine and ETOH, use compression socks, move [place] -Continue follow-up with nephrology- patient is  following with Dr MaIla Mcgill-Pt previously decided to not get a transplant [facility] -Continue age-appropriate cancer screening -Will see the pt back in 2-3 months   4) 5) DM2 - better controled -continue f/u with PCP.   RTC with Dr KaIrene Limbon 10 weeks with labs   The total time spent in the appt was 20 minutes and more than 50% was on counseling and direct patient cares.    GaSullivan LoneD MS AAHIVMS SCCarolinas Medical Center-MercyTSt. Bernardine Medical Centerematology/Oncology Physician CoAtlantic Rehabilitation Institute(Office):       [phone] cell):  [phone]):           [phone], ScBaldwin Jamaicaam acting as a scribe for Dr. KaIrene Limbo.I have reviewed the above documentation for accuracy and completeness, and I agree with the above. .GBrunetta GeneraD

## 2017-09-30 ENCOUNTER — Inpatient Hospital Stay (HOSPITAL_BASED_OUTPATIENT_CLINIC_OR_DEPARTMENT_OTHER): Payer: Medicare HMO | Admitting: Hematology

## 2017-09-30 ENCOUNTER — Telehealth: Payer: Self-pay

## 2017-09-30 ENCOUNTER — Encounter: Payer: Self-pay | Admitting: Hematology

## 2017-09-30 ENCOUNTER — Inpatient Hospital Stay: Payer: Medicare HMO | Attending: Hematology

## 2017-09-30 VITALS — BP 159/81 | HR 58 | Temp 98.0°F | Resp 12 | Ht 71.0 in | Wt 174.2 lb

## 2017-09-30 DIAGNOSIS — E119 Type 2 diabetes mellitus without complications: Secondary | ICD-10-CM | POA: Insufficient documentation

## 2017-09-30 DIAGNOSIS — Z7982 Long term (current) use of aspirin: Secondary | ICD-10-CM

## 2017-09-30 DIAGNOSIS — Z79899 Other long term (current) drug therapy: Secondary | ICD-10-CM | POA: Insufficient documentation

## 2017-09-30 DIAGNOSIS — I10 Essential (primary) hypertension: Secondary | ICD-10-CM | POA: Insufficient documentation

## 2017-09-30 DIAGNOSIS — I2510 Atherosclerotic heart disease of native coronary artery without angina pectoris: Secondary | ICD-10-CM | POA: Insufficient documentation

## 2017-09-30 DIAGNOSIS — C9000 Multiple myeloma not having achieved remission: Secondary | ICD-10-CM | POA: Diagnosis present

## 2017-09-30 DIAGNOSIS — D649 Anemia, unspecified: Secondary | ICD-10-CM | POA: Diagnosis not present

## 2017-09-30 DIAGNOSIS — N179 Acute kidney failure, unspecified: Secondary | ICD-10-CM | POA: Diagnosis not present

## 2017-09-30 DIAGNOSIS — N08 Glomerular disorders in diseases classified elsewhere: Secondary | ICD-10-CM

## 2017-09-30 DIAGNOSIS — C9001 Multiple myeloma in remission: Secondary | ICD-10-CM

## 2017-09-30 LAB — CMP (CANCER CENTER ONLY)
ALK PHOS: 99 U/L (ref 38–126)
ALT: 17 U/L (ref 0–44)
ANION GAP: 7 (ref 5–15)
AST: 14 U/L — ABNORMAL LOW (ref 15–41)
Albumin: 3.5 g/dL (ref 3.5–5.0)
BILIRUBIN TOTAL: 0.3 mg/dL (ref 0.3–1.2)
BUN: 21 mg/dL (ref 8–23)
CALCIUM: 8.9 mg/dL (ref 8.9–10.3)
CO2: 26 mmol/L (ref 22–32)
Chloride: 106 mmol/L (ref 98–111)
Creatinine: 1.82 mg/dL — ABNORMAL HIGH (ref 0.61–1.24)
GFR, EST AFRICAN AMERICAN: 41 mL/min — AB (ref >60)
GFR, Estimated: 35 mL/min — ABNORMAL LOW (ref >60)
GLUCOSE: 185 mg/dL — AB (ref 70–99)
Potassium: 4 mmol/L (ref 3.5–5.1)
Sodium: 139 mmol/L (ref 135–145)
TOTAL PROTEIN: 7.2 g/dL (ref 6.5–8.1)

## 2017-09-30 LAB — CBC WITH DIFFERENTIAL/PLATELET
BASOS PCT: 0 %
Basophils Absolute: 0 10*3/uL (ref 0.0–0.1)
EOS ABS: 0.2 10*3/uL (ref 0.0–0.5)
Eosinophils Relative: 7 %
HCT: 34.2 % — ABNORMAL LOW (ref 38.4–49.9)
HEMOGLOBIN: 11.1 g/dL — AB (ref 13.0–17.1)
Lymphocytes Relative: 28 %
Lymphs Abs: 0.9 10*3/uL (ref 0.9–3.3)
MCH: 28.2 pg (ref 27.2–33.4)
MCHC: 32.5 g/dL (ref 32.0–36.0)
MCV: 86.8 fL (ref 79.3–98.0)
Monocytes Absolute: 0.5 10*3/uL (ref 0.1–0.9)
Monocytes Relative: 14 %
NEUTROS PCT: 51 %
Neutro Abs: 1.7 10*3/uL (ref 1.5–6.5)
PLATELETS: 131 10*3/uL — AB (ref 140–400)
RBC: 3.94 MIL/uL — AB (ref 4.20–5.82)
RDW: 17 % — AB (ref 11.0–14.6)
WBC: 3.4 10*3/uL — ABNORMAL LOW (ref 4.0–10.3)

## 2017-09-30 LAB — RETICULOCYTES
RBC.: 3.94 MIL/uL — ABNORMAL LOW (ref 4.20–5.82)
RETIC CT PCT: 0.9 % (ref 0.8–1.8)
Retic Count, Absolute: 35.5 10*3/uL (ref 34.8–93.9)

## 2017-09-30 NOTE — Telephone Encounter (Signed)
Printed avs and calender of upcoming appointment. Per 9/25 los 

## 2017-10-02 ENCOUNTER — Other Ambulatory Visit: Payer: Self-pay

## 2017-10-02 DIAGNOSIS — C9000 Multiple myeloma not having achieved remission: Secondary | ICD-10-CM

## 2017-10-02 LAB — MULTIPLE MYELOMA PANEL, SERUM
ALBUMIN/GLOB SERPL: 1.2 (ref 0.7–1.7)
ALPHA 1: 0.2 g/dL (ref 0.0–0.4)
ALPHA2 GLOB SERPL ELPH-MCNC: 0.7 g/dL (ref 0.4–1.0)
Albumin SerPl Elph-Mcnc: 3.7 g/dL (ref 2.9–4.4)
B-Globulin SerPl Elph-Mcnc: 0.9 g/dL (ref 0.7–1.3)
Gamma Glob SerPl Elph-Mcnc: 1.4 g/dL (ref 0.4–1.8)
Globulin, Total: 3.1 g/dL (ref 2.2–3.9)
IGA: 276 mg/dL (ref 61–437)
IGG (IMMUNOGLOBIN G), SERUM: 1544 mg/dL (ref 700–1600)
IGM (IMMUNOGLOBULIN M), SRM: 38 mg/dL (ref 15–143)
Total Protein ELP: 6.8 g/dL (ref 6.0–8.5)

## 2017-10-02 MED ORDER — LENALIDOMIDE 10 MG PO CAPS: 10.0000 mg | ORAL_CAPSULE | Freq: Every day | ORAL | 0 refills | Status: DC

## 2017-10-07 ENCOUNTER — Ambulatory Visit (INDEPENDENT_AMBULATORY_CARE_PROVIDER_SITE_OTHER): Payer: Medicare HMO | Admitting: Cardiology

## 2017-10-07 ENCOUNTER — Encounter: Payer: Self-pay | Admitting: Cardiology

## 2017-10-07 VITALS — BP 142/74 | HR 65 | Ht 71.0 in | Wt 173.0 lb

## 2017-10-07 DIAGNOSIS — E1159 Type 2 diabetes mellitus with other circulatory complications: Secondary | ICD-10-CM

## 2017-10-07 DIAGNOSIS — I10 Essential (primary) hypertension: Secondary | ICD-10-CM | POA: Diagnosis not present

## 2017-10-07 DIAGNOSIS — E782 Mixed hyperlipidemia: Secondary | ICD-10-CM

## 2017-10-07 DIAGNOSIS — I2510 Atherosclerotic heart disease of native coronary artery without angina pectoris: Secondary | ICD-10-CM | POA: Diagnosis not present

## 2017-10-07 NOTE — Patient Instructions (Signed)

## 2017-10-07 NOTE — Progress Notes (Signed)
Cardiology Office Note:    Date:  10/07/2017   ID:  Douglas Rose., DOB 11-26-1945, MRN [id number]  PCP:  [name], MD  Cardiologist:  Jenean Lindau, MD   Referring MD: [name], MD    ASSESSMENT:    1. Coronary artery disease involving native coronary artery of native heart without angina pectoris   2. Type 2 diabetes mellitus with vascular disease ([place])   3. Essential hypertension   4. Mixed hyperlipidemia    PLAN:    In order of problems listed above:  1. I discussed my findings with the patient at extensive length.  Secondary prevention stressed to the patient.  Importance of compliance with diet and medication stressed and she vocalized understanding.  His blood pressure is stable. 2. Diet was discussed for dyslipidemia and diabetes mellitus. 3. Patient will be seen in follow-up appointment in 6 months or earlier if the patient has any concerns    Medication Adjustments/Labs and Tests Ordered: Current medicines are reviewed at length with the patient today.  Concerns regarding medicines are outlined above.  No orders of the defined types were placed in this encounter.  No orders of the defined types were placed in this encounter.    No chief complaint on file.    History of Present Illness:    Douglas Rose. is a 72 y.o. male.  Patient has known coronary artery disease.  He denies any problems at this time and takes care of activities of daily living.  No chest pain orthopnea or PND.  He walks on a regular basis.  At the time of my evaluation, the patient is alert awake oriented and in no distress.  Past Medical History:  Diagnosis Date  . CAD (coronary artery disease) 2006   stent LAD 3.0x18 Cypher, occluded RCA  . Cancer ([place])    multiple myeloma  . Diabetes mellitus without complication ([place])   . H/O non-insulin dependent diabetes mellitus   . Hyperlipidemia   . Hypertension     Past Surgical History:  Procedure Laterality Date    . CARDIAC CATHETERIZATION  2006  . CORONARY ANGIOPLASTY WITH STENT PLACEMENT  2007  . IR FLUORO GUIDE CV LINE RIGHT  05/20/2016  . IR US GUIDE VASC ACCESS RIGHT  05/20/2016    Current Medications: Current Meds  Medication Sig  . acyclovir (ZOVIRAX) 200 MG capsule Take 1 capsule (200 mg total) by mouth 2 (two) times daily.  [name] amLODipine (NORVASC) 10 MG tablet Take 10 mg by mouth daily.  [name] aspirin 81 MG tablet Take 81 mg by mouth daily.  [name] atorvastatin (LIPITOR) 20 MG tablet Take 1 tablet (20 mg total) by mouth daily.  . Blood Glucose Monitoring Suppl (ACCU-CHEK AVIVA PLUS) w/Device KIT by Does not apply route.  . gabapentin (NEURONTIN) 300 MG capsule Take 300 mg by mouth daily after supper.  . Insulin Glargine (LANTUS SOLOSTAR) 100 UNIT/ML Solostar Pen Inject 20 Units into the skin daily. (Patient taking differently: Inject 12 Units into the skin daily. )  . lenalidomide (REVLIMID) 10 MG capsule Take 1 capsule (10 mg total) by mouth daily. Take for 21 days then 7 days off. Take with water. Auth# [id number] 10/02/2017  . lisinopril (PRINIVIL,ZESTRIL) 2.5 MG tablet Take 2.5 mg by mouth daily.  . metoprolol tartrate (LOPRESSOR) 100 MG tablet Take 100 mg by mouth once.  . nitroGLYCERIN (NITROSTAT) 0.4 MG SL tablet Place 1 tablet (0.4 mg total) under the tongue every 5 (five)  minutes x 3 doses as needed for chest pain.  [name] RELION PEN NEEDLES 32G X 4 MM MISC   . vitamin B-12 (CYANOCOBALAMIN) 1000 MCG tablet Take 1,000 mcg by mouth daily.     Allergies:   Patient has no known allergies.   Social History   Socioeconomic History  . Marital status: Married    Spouse name: Not on file  . Number of children: 6  . Years of education: Not on file  . Highest education level: Not on file  Occupational History  . Occupation: retired Audiological scientist  . Financial resource strain: Not on file  . Food insecurity:    Worry: Not on file    Inability: Not on file  . Transportation needs:     Medical: Not on file    Non-medical: Not on file  Tobacco Use  . Smoking status: Never Smoker  . Smokeless tobacco: Never Used  Substance and Sexual Activity  . Alcohol use: No  . Drug use: No  . Sexual activity: Not Currently  Lifestyle  . Physical activity:    Days per week: Not on file    Minutes per session: Not on file  . Stress: Not on file  Relationships  . Social connections:    Talks on phone: Not on file    Gets together: Not on file    Attends religious service: Not on file    Active member of club or organization: Not on file    Attends meetings of clubs or organizations: Not on file    Relationship status: Not on file  Other Topics Concern  . Not on file  Social History Narrative  . Not on file     Family History: The patient's family history includes Arrhythmia in his mother; Cancer in his brother; Diabetes in his brother, brother, and sister; Heart disease in his mother; Stroke in his father.  ROS:   Please see the history of present illness.    All other systems reviewed and are negative.  EKGs/Labs/Other Studies Reviewed:    The following studies were reviewed today: I discussed my findings with the patient at extensive length.   Recent Labs: 04/03/2017: TSH 0.703 09/30/2017: ALT 17; BUN 21; Creatinine 1.82; Hemoglobin 11.1; Platelets 131; Potassium 4.0; Sodium 139  Recent Lipid Panel    Component Value Date/Time   CHOL 134 04/03/2017 1125   TRIG 101 04/03/2017 1125   HDL 50 04/03/2017 1125   CHOLHDL 2.7 04/03/2017 1125   CHOLHDL 4 01/11/2013 1030   VLDL 16.8 01/11/2013 1030   LDLCALC 64 04/03/2017 1125    Physical Exam:    VS:  BP (!) 142/74 (BP Location: Right Arm, Patient Position: Sitting, Cuff Size: Normal)   Pulse 65   Ht 5' 11"  (1.803 m)   Wt 173 lb (78.5 kg)   SpO2 99%   BMI 24.13 kg/m     Wt Readings from Last 3 Encounters:  10/07/17 173 lb (78.5 kg)  09/30/17 174 lb 3.2 oz (79 kg)  07/21/17 173 lb (78.5 kg)     GEN:  Patient is in no acute distress HEENT: Normal NECK: No JVD; No carotid bruits LYMPHATICS: No lymphadenopathy CARDIAC: Hear sounds regular, 2/6 systolic murmur at the apex. RESPIRATORY:  Clear to auscultation without rales, wheezing or rhonchi  ABDOMEN: Soft, non-tender, non-distended MUSCULOSKELETAL:  No edema; No deformity  SKIN: Warm and dry NEUROLOGIC:  Alert and oriented x 3 PSYCHIATRIC:  Normal affect   Signed, Sunny Schlein  Reuel Derby, MD  10/07/2017 2:50 PM    [facility]

## 2017-11-03 ENCOUNTER — Other Ambulatory Visit: Payer: Self-pay | Admitting: *Deleted

## 2017-11-03 DIAGNOSIS — C9000 Multiple myeloma not having achieved remission: Secondary | ICD-10-CM

## 2017-11-03 MED ORDER — LENALIDOMIDE 10 MG PO CAPS: 10.0000 mg | ORAL_CAPSULE | Freq: Every day | ORAL | 0 refills | Status: DC

## 2017-11-30 ENCOUNTER — Other Ambulatory Visit: Payer: Self-pay

## 2017-11-30 DIAGNOSIS — C9000 Multiple myeloma not having achieved remission: Secondary | ICD-10-CM

## 2017-11-30 MED ORDER — LENALIDOMIDE 10 MG PO CAPS: 10.0000 mg | ORAL_CAPSULE | Freq: Every day | ORAL | 0 refills | Status: DC

## 2017-12-08 NOTE — Progress Notes (Signed)
[name]  HEMATOLOGY ONCOLOGY PROGRESS NOTE  Date of service: 12/09/17  Patient Care Team: [name], MD as PCP - General (Family Medicine)  CC: f/u for multiple myeloma  Diagnosis:  IgG Kappa multiple myeloma   Current Treatment:  Revlimid maintenance   INTERVAL HISTORY:  [name] presents today for management and evaluation of his maintenance Revlimid treatment for his multiple myeloma. The patient's last visit with Douglas Rose was on 09/30/17. He is accompanied today by his wife. The pt reports that he is doing well overall.   The pt reports that he has not developed any new concerns in the interim. He notes that he is at his baseline weight and has been eating very well. He endorses good energy levels and is staying well hydrated. The pt denies any new bone pains.   The pt notes that he has continued on Revlimid without any concerns. He denies any leg swelling.   He notes that his lipoma on the back of his neck has not changed.   Lab results today (12/09/17) of CBC w/diff and CMP is as follows: all values are WNL except for WBC at 3.4k, RBC at 3.97, HGB at 11.1, HCT at 34.3, RDW at 16.7, ANC at 1.6k, Glucose at 237, BUN at 25, Creatinine at 1.78, Albumin at 3.3, AST at 13, GFR at 43. 12/09/17 SPEP with no M spikes  On review of systems, pt reports eating well, ideal body weight, good energy levels, staying hydrated, emptying bladder well, and denies abdominal pains, new bone pains, leg swelling, changes in bowel habits, discomfort passing urine, and any other symptoms.   REVIEW OF SYSTEMS:    A 10+ POINT REVIEW OF SYSTEMS WAS OBTAINED including neurology, dermatology, psychiatry, cardiac, respiratory, lymph, extremities, GI, GU, Musculoskeletal, constitutional, breasts, reproductive, HEENT.  All pertinent positives are noted in the HPI.  All others are negative.   Past Medical History:  Diagnosis Date  . CAD (coronary artery disease) 2006   stent LAD 3.0x18 [place], occluded RCA  .  Cancer ([place])    multiple myeloma  . Diabetes mellitus without complication ([place])   . H/O non-insulin dependent diabetes mellitus   . Hyperlipidemia   . Hypertension     . Past Surgical History:  Procedure Laterality Date  . CARDIAC CATHETERIZATION  2006  . CORONARY ANGIOPLASTY WITH STENT PLACEMENT  2007  . IR FLUORO GUIDE CV LINE RIGHT  05/20/2016  . IR Douglas Rose GUIDE VASC ACCESS RIGHT  05/20/2016    . Social History   Tobacco Use  . Smoking status: Never Smoker  . Smokeless tobacco: Never Used  Substance Use Topics  . Alcohol use: No  . Drug use: No    ALLERGIES:  has No Known Allergies.  MEDICATIONS:  Current Outpatient Medications  Medication Sig Dispense Refill  . acyclovir (ZOVIRAX) 200 MG capsule Take 1 capsule (200 mg total) by mouth 2 (two) times daily. 90 capsule 1  . amLODipine (NORVASC) 10 MG tablet Take 10 mg by mouth daily.    [name] aspirin 81 MG tablet Take 81 mg by mouth daily.    [name] atorvastatin (LIPITOR) 20 MG tablet Take 1 tablet (20 mg total) by mouth daily. 90 tablet 3  . Blood Glucose Monitoring Suppl (ACCU-CHEK AVIVA PLUS) w/Device KIT by Does not apply route.    . gabapentin (NEURONTIN) 300 MG capsule Take 300 mg by mouth daily after supper.    . Insulin Glargine (LANTUS SOLOSTAR) 100 UNIT/ML Solostar Pen Inject 20 Units into  the skin daily. (Patient taking differently: Inject 12 Units into the skin daily. ) 15 mL 11  . lenalidomide (REVLIMID) 10 MG capsule Take 1 capsule (10 mg total) by mouth daily. Take for 21 days then 7 days off. Take with water. Auth# [id number] 11/30/17 21 capsule 0  . lisinopril (PRINIVIL,ZESTRIL) 2.5 MG tablet Take 2.5 mg by mouth daily.    . metoprolol tartrate (LOPRESSOR) 100 MG tablet Take 100 mg by mouth once.    . nitroGLYCERIN (NITROSTAT) 0.4 MG SL tablet Place 1 tablet (0.4 mg total) under the tongue every 5 (five) minutes x 3 doses as needed for chest pain. 30 tablet 12  . RELION PEN NEEDLES 32G X 4 MM MISC     . vitamin B-12  (CYANOCOBALAMIN) 1000 MCG tablet Take 1,000 mcg by mouth daily.     No current facility-administered medications for this visit.     PHYSICAL EXAMINATION: ECOG PERFORMANCE STATUS: 1 - Symptomatic but completely ambulatory  Vitals:   12/09/17 1340  BP: (!) 167/76  Pulse: 76  Resp: 18  Temp: 97.8 F (36.6 C)  SpO2: 100%    Filed Weights   12/09/17 1340  Weight: 175 lb (79.4 kg)   .Body mass index is 24.41 kg/m.  GENERAL:alert, in no acute distress and comfortable SKIN: no acute rashes, no significant lesions EYES: conjunctiva are pink and non-injected, sclera anicteric OROPHARYNX: MMM, no exudates, no oropharyngeal erythema or ulceration NECK: supple, no JVD LYMPH:  Unchanged right cervical LN, no palpable lymphadenopathy in the axillary or inguinal regions LUNGS: clear to auscultation b/l with normal respiratory effort HEART: regular rate & rhythm ABDOMEN:  normoactive bowel sounds , non tender, not distended. No palpable hepatosplenomegaly.  Extremity: no pedal edema PSYCH: alert & oriented x 3 with fluent speech NEURO: no focal motor/sensory deficits   LABORATORY DATA:   I have reviewed the data as listed  . CBC Latest Ref Rng & Units 12/09/2017 09/30/2017 07/21/2017  WBC 4.0 - 10.5 K/uL 3.4(L) 3.4(L) 3.1(L)  Hemoglobin 13.0 - 17.0 g/dL 11.1(L) 11.1(L) 10.8(L)  Hematocrit 39.0 - 52.0 % 34.3(L) 34.2(L) 32.7(L)  Platelets 150 - 400 K/uL 227 131(L) 198  ANC 1.7k  . CMP Latest Ref Rng & Units 12/09/2017 09/30/2017 07/21/2017  Glucose 70 - 99 mg/dL 237(H) 185(H) 150(H)  BUN 8 - 23 mg/dL 25(H) 21 19  Creatinine 0.61 - 1.24 mg/dL 1.78(H) 1.82(H) 1.67(H)  Sodium 135 - 145 mmol/L 139 139 139  Potassium 3.5 - 5.1 mmol/L 3.7 4.0 3.4(L)  Chloride 98 - 111 mmol/L 107 106 107  CO2 22 - 32 mmol/L _0 Calcium 8.9 - 10.3 mg/dL 8.9 8.9 8.7(L)  Total Protein 6.5 - 8.1 g/dL 7.3 7.2 7.0  Total Bilirubin 0.3 - 1.2 mg/dL 0.3 0.3 0.3  Alkaline Phos 38 - 126 U/L 99 99 98  AST  15 - 41 U/L 13(L) 14(L) 17  ALT 0 - 44 U/L _1 RADIOGRAPHIC STUDIES: I have personally reviewed the radiological images as listed and agreed with the findings in the report. No results found.  ASSESSMENT & PLAN:   72 y.o.  with multiple medical comorbidities but fairly good performance status overall with   1) ISS Stage II IgG Kapppa multiple myeloma. Anemia + Renal insufficiency.  Bone survey neg for concerning bone lesions. significantly elevated kappafree light chains 858 mg percent, lambda 18.4, with a ratio of 46.65 . He was also  noted to have an M spike of 1.7 g/dL with IFE showing IgG kappa monoclonal protein. Quantitative IgG level was increased to nearly 2500 mg/dL. 24-hour UPEP showed monoclonal protein of 1300 mg per 24 hours which constituted about 86% of all the urinary protein. Bone marrow biopsy showed 20-30% Restricted plasma cells consistent with multiple myeloma Cytogenetics/FISH  Showed   Patient has been treated with CyBord X 2 cycles . Treatment interrupted due to severe c diff colitis with prolonged hospitalization and decline in performance status.  Patient is much improved and back to baseline now. 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 Had an element of obstructive uropathy due to BPH which has resolved. Creatinine continues to improve from 3.5 to 2.9 to 1.9 to 1.6. Stable 1.6-1.8  3) Normocytic anemia likely related to multiple myeloma.-stable, improved. hgb 11.9 No abdominal pain or discomfort over the right upper quadrant. No fevers or chills.  PLAN:   -Discussed pt labwork today, 12/09/17; blood counts and chemistries are stable  -12/09/17 MMP shows M Protein not observable -The pt has no prohibitive toxicities from continuing maintenance 24m Revlimid 3 weeks on and one week off at this time.   -Continue  827maspirin   -Continue follow-up with nephrology- patient is following with Dr MaIla Mcgill-Pt previously decided to not get a transplant [facility] -Continue age-appropriate cancer screening -Will see the pt back in 2 months   4) 5) DM2 - better controled -continue f/u with PCP.   RTC with Dr KaIrene Limboith labs in 2 months   The total time spent in the appt was 20 minutes and more than 50% was on counseling and direct patient cares.    GaSullivan LoneD MSBrookingsAHIVMS SCSt. Rose Dominican Hospitals - San Martin CampusTAvalon Surgery And Robotic Center LLCematology/Oncology Physician CoGreenbriar Rehabilitation Hospital(Office):       [phone] cell):  [phone]):           [phone], ScBaldwin Jamaicaam acting as a scribe for Dr. GaSullivan Lone  .I have reviewed the above documentation for accuracy and completeness, and I agree with the above. .GBrunetta GeneraD

## 2017-12-09 ENCOUNTER — Inpatient Hospital Stay: Payer: Medicare HMO | Attending: Hematology | Admitting: Hematology

## 2017-12-09 ENCOUNTER — Telehealth: Payer: Self-pay | Admitting: Hematology

## 2017-12-09 ENCOUNTER — Encounter: Payer: Self-pay | Admitting: Hematology

## 2017-12-09 ENCOUNTER — Inpatient Hospital Stay: Payer: Medicare HMO

## 2017-12-09 VITALS — BP 167/76 | HR 76 | Temp 97.8°F | Resp 18 | Ht 71.0 in | Wt 175.0 lb

## 2017-12-09 DIAGNOSIS — D649 Anemia, unspecified: Secondary | ICD-10-CM | POA: Insufficient documentation

## 2017-12-09 DIAGNOSIS — Z7982 Long term (current) use of aspirin: Secondary | ICD-10-CM | POA: Diagnosis not present

## 2017-12-09 DIAGNOSIS — I10 Essential (primary) hypertension: Secondary | ICD-10-CM | POA: Insufficient documentation

## 2017-12-09 DIAGNOSIS — D171 Benign lipomatous neoplasm of skin and subcutaneous tissue of trunk: Secondary | ICD-10-CM | POA: Diagnosis not present

## 2017-12-09 DIAGNOSIS — Z794 Long term (current) use of insulin: Secondary | ICD-10-CM | POA: Diagnosis not present

## 2017-12-09 DIAGNOSIS — C9000 Multiple myeloma not having achieved remission: Secondary | ICD-10-CM | POA: Insufficient documentation

## 2017-12-09 DIAGNOSIS — Z7984 Long term (current) use of oral hypoglycemic drugs: Secondary | ICD-10-CM

## 2017-12-09 DIAGNOSIS — Z79899 Other long term (current) drug therapy: Secondary | ICD-10-CM | POA: Diagnosis not present

## 2017-12-09 DIAGNOSIS — E119 Type 2 diabetes mellitus without complications: Secondary | ICD-10-CM | POA: Insufficient documentation

## 2017-12-09 DIAGNOSIS — N289 Disorder of kidney and ureter, unspecified: Secondary | ICD-10-CM | POA: Diagnosis not present

## 2017-12-09 DIAGNOSIS — C9001 Multiple myeloma in remission: Secondary | ICD-10-CM

## 2017-12-09 LAB — CBC WITH DIFFERENTIAL/PLATELET
ABS IMMATURE GRANULOCYTES: 0.01 10*3/uL (ref 0.00–0.07)
BASOS ABS: 0 10*3/uL (ref 0.0–0.1)
BASOS PCT: 1 %
Eosinophils Absolute: 0.2 10*3/uL (ref 0.0–0.5)
Eosinophils Relative: 7 %
HCT: 34.3 % — ABNORMAL LOW (ref 39.0–52.0)
HEMOGLOBIN: 11.1 g/dL — AB (ref 13.0–17.0)
Immature Granulocytes: 0 %
LYMPHS ABS: 0.9 10*3/uL (ref 0.7–4.0)
LYMPHS PCT: 28 %
MCH: 28 pg (ref 26.0–34.0)
MCHC: 32.4 g/dL (ref 30.0–36.0)
MCV: 86.4 fL (ref 80.0–100.0)
Monocytes Absolute: 0.6 10*3/uL (ref 0.1–1.0)
Monocytes Relative: 18 %
NEUTROS ABS: 1.6 10*3/uL — AB (ref 1.7–7.7)
Neutrophils Relative %: 46 %
PLATELETS: 227 10*3/uL (ref 150–400)
RBC: 3.97 MIL/uL — AB (ref 4.22–5.81)
RDW: 16.7 % — ABNORMAL HIGH (ref 11.5–15.5)
WBC: 3.4 10*3/uL — ABNORMAL LOW (ref 4.0–10.5)
nRBC: 0 % (ref 0.0–0.2)

## 2017-12-09 LAB — CMP (CANCER CENTER ONLY)
ALBUMIN: 3.3 g/dL — AB (ref 3.5–5.0)
ALT: 14 U/L (ref 0–44)
AST: 13 U/L — AB (ref 15–41)
Alkaline Phosphatase: 99 U/L (ref 38–126)
Anion gap: 7 (ref 5–15)
BUN: 25 mg/dL — AB (ref 8–23)
CHLORIDE: 107 mmol/L (ref 98–111)
CO2: 25 mmol/L (ref 22–32)
Calcium: 8.9 mg/dL (ref 8.9–10.3)
Creatinine: 1.78 mg/dL — ABNORMAL HIGH (ref 0.61–1.24)
GFR, EST NON AFRICAN AMERICAN: 37 mL/min — AB (ref >60)
GFR, Est AFR Am: 43 mL/min — ABNORMAL LOW (ref >60)
GLUCOSE: 237 mg/dL — AB (ref 70–99)
POTASSIUM: 3.7 mmol/L (ref 3.5–5.1)
Sodium: 139 mmol/L (ref 135–145)
Total Bilirubin: 0.3 mg/dL (ref 0.3–1.2)
Total Protein: 7.3 g/dL (ref 6.5–8.1)

## 2017-12-09 NOTE — Patient Instructions (Signed)
Thank you for choosing [facility] to provide your oncology and hematology care.  To afford each patient quality time with our providers, please arrive 30 minutes before your scheduled appointment time.  If you arrive late for your appointment, you may be asked to reschedule.  We strive to give you quality time with our providers, and arriving late affects you and other patients whose appointments are after yours.    If you are a no show for multiple scheduled visits, you may be dismissed from the clinic at the providers discretion.     Again, thank you for choosing [facility], our hope is that these requests will decrease the amount of time that you wait before being seen by our physicians.  ______________________________________________________________________   Should you have questions after your visit to the [facility], please contact our office at [phone] between the hours of 8:30 and 4:30 p.m.    Voicemails left after 4:30p.m will not be returned until the following business day.     For prescription refill requests, please have your pharmacy contact us directly.  Please also try to allow 48 hours for prescription requests.     Please contact the scheduling department for questions regarding scheduling.  For scheduling of procedures such as PET scans, CT scans, MRI, Ultrasound, etc please contact central scheduling at [phone].     Resources For Cancer Patients and Caregivers:    Oncolink.org:  A wonderful resource for patients and healthcare providers for information regarding your disease, ways to tract your treatment, what to expect, etc.      American Cancer Society:  800-227-2345  Can help patients locate various types of support and financial assistance   Cancer Care: 1-800-813-HOPE (4673) Provides financial assistance, online support groups, medication/co-pay assistance.     [place] DSS:  336-641-3447 Where to apply  for food stamps, Medicaid, and utility assistance   Medicare Rights Center: 800-333-4114 Helps people with Medicare understand their rights and benefits, navigate the Medicare system, and secure the quality healthcare they deserve   SCAT: 336-333-6589 [place] [employer]'s shared-ride transportation service for eligible riders who have a disability that prevents them from riding the fixed route bus.     For additional information on assistance programs please contact our social worker:   Abigail Elmore:  [phone]  

## 2017-12-09 NOTE — Telephone Encounter (Signed)
Printed calendar and avs. °

## 2017-12-12 LAB — MULTIPLE MYELOMA PANEL, SERUM
ALBUMIN SERPL ELPH-MCNC: 3.4 g/dL (ref 2.9–4.4)
ALBUMIN/GLOB SERPL: 1.1 (ref 0.7–1.7)
ALPHA 1: 0.2 g/dL (ref 0.0–0.4)
ALPHA2 GLOB SERPL ELPH-MCNC: 0.7 g/dL (ref 0.4–1.0)
B-Globulin SerPl Elph-Mcnc: 0.9 g/dL (ref 0.7–1.3)
Gamma Glob SerPl Elph-Mcnc: 1.4 g/dL (ref 0.4–1.8)
Globulin, Total: 3.2 g/dL (ref 2.2–3.9)
IGA: 301 mg/dL (ref 61–437)
IGG (IMMUNOGLOBIN G), SERUM: 1668 mg/dL — AB (ref 700–1600)
IGM (IMMUNOGLOBULIN M), SRM: 39 mg/dL (ref 15–143)
Total Protein ELP: 6.6 g/dL (ref 6.0–8.5)

## 2017-12-29 ENCOUNTER — Other Ambulatory Visit: Payer: Self-pay | Admitting: *Deleted

## 2017-12-29 DIAGNOSIS — C9000 Multiple myeloma not having achieved remission: Secondary | ICD-10-CM

## 2017-12-29 MED ORDER — LENALIDOMIDE 10 MG PO CAPS: 10.0000 mg | ORAL_CAPSULE | Freq: Every day | ORAL | 0 refills | Status: DC

## 2017-12-29 NOTE — Telephone Encounter (Signed)
Patient called - Revlimid 10 mg requires refill. Refill sent to [facility]. Patient notified.

## 2018-01-25 ENCOUNTER — Other Ambulatory Visit: Payer: Self-pay | Admitting: *Deleted

## 2018-01-25 DIAGNOSIS — C9000 Multiple myeloma not having achieved remission: Secondary | ICD-10-CM

## 2018-01-25 MED ORDER — LENALIDOMIDE 10 MG PO CAPS: 10.0000 mg | ORAL_CAPSULE | Freq: Every day | ORAL | 0 refills | Status: DC

## 2018-01-25 NOTE — Telephone Encounter (Signed)
Refill for Revlimid sent to [place]

## 2018-02-02 ENCOUNTER — Telehealth: Payer: Self-pay | Admitting: Hematology

## 2018-02-02 NOTE — Telephone Encounter (Signed)
R/s appt per 1/27 sch message. - pt is aware of appt date and time   

## 2018-02-02 NOTE — Progress Notes (Signed)
Douglas Kitchen  HEMATOLOGY ONCOLOGY PROGRESS NOTE  Date of service: 02/03/18  Patient Care Team: [name], MD as PCP - General (Family Medicine)  CC: f/u for multiple myeloma  Diagnosis:  IgG Kappa multiple myeloma   Current Treatment:  Revlimid maintenance   INTERVAL HISTORY:  Douglas Rose presents today for management and evaluation of his maintenance Revlimid treatment for his multiple myeloma. The patient's last visit with [name] was on 12/09/17. He is accompanied today by his wife. The pt reports that he is doing well overall.   The pt reports that he began sensing a dull pain in his left flank with "some discomfort." He notes that this persisted for a two days, in the last two days, he began using a heating pad, and doesn't feel this today. The pt notes that there was a positional element to his left flank discomfort as well, when he twists or turns. He denies changes in urination.  The pt denies any problems taking his Revlimid and denies any concerns for infections.  Lab results today (02/03/18) of CBC w/diff and CMP is as follows: all values are WNL except for WBC at 2.7k, RBC at 3.91, HGB at 11.0, HCT at 33.7, RDW at 17.1, ANC at 1.0k, Potassium at 3.1, Glucose at 141, Creatinine at 1.54, Calcium at 8.7, Albumin at 3.3, AST at 14, GFR at 51. 02/03/18 MMP shows no M spike  On review of systems, pt reports recent left flank discomfort, good energy levels, eating well, stable energy levels, and denies pain along the spine, back pain, new bone pains, changes in urination, concern for infections, and any other symptoms.   REVIEW OF SYSTEMS:    A 10+ POINT REVIEW OF SYSTEMS WAS OBTAINED including neurology, dermatology, psychiatry, cardiac, respiratory, lymph, extremities, GI, GU, Musculoskeletal, constitutional, breasts, reproductive, HEENT.  All pertinent positives are noted in the HPI.  All others are negative.   Past Medical History:  Diagnosis Date  . CAD (coronary artery disease) 2006    stent LAD 3.0x18 [place], occluded RCA  . Cancer ([place])    multiple myeloma  . Diabetes mellitus without complication ([place])   . H/O non-insulin dependent diabetes mellitus   . Hyperlipidemia   . Hypertension     . Past Surgical History:  Procedure Laterality Date  . CARDIAC CATHETERIZATION  2006  . CORONARY ANGIOPLASTY WITH STENT PLACEMENT  2007  . IR FLUORO GUIDE CV LINE RIGHT  05/20/2016  . IR US GUIDE VASC ACCESS RIGHT  05/20/2016    . Social History   Tobacco Use  . Smoking status: Never Smoker  . Smokeless tobacco: Never Used  Substance Use Topics  . Alcohol use: No  . Drug use: No    ALLERGIES:  has No Known Allergies.  MEDICATIONS:  Current Outpatient Medications  Medication Sig Dispense Refill  . acyclovir (ZOVIRAX) 200 MG capsule Take 1 capsule (200 mg total) by mouth 2 (two) times daily. 90 capsule 1  . amLODipine (NORVASC) 10 MG tablet Take 10 mg by mouth daily.    Douglas Kitchen aspirin 81 MG tablet Take 81 mg by mouth daily.    Douglas Kitchen atorvastatin (LIPITOR) 20 MG tablet Take 1 tablet (20 mg total) by mouth daily. 90 tablet 3  . Blood Glucose Monitoring Suppl (ACCU-CHEK AVIVA PLUS) w/Device KIT by Does not apply route.    . gabapentin (NEURONTIN) 300 MG capsule Take 300 mg by mouth daily after supper.    . Insulin Glargine (LANTUS SOLOSTAR) 100 UNIT/ML Solostar Pen  Inject 20 Units into the skin daily. (Patient taking differently: Inject 12 Units into the skin daily. ) 15 mL 11  . lenalidomide (REVLIMID) 10 MG capsule Take 1 capsule (10 mg total) by mouth daily. Take for 21 days then 7 days off. Take with water. Auth# [id number] 01/25/2018 21 capsule 0  . lisinopril (PRINIVIL,ZESTRIL) 2.5 MG tablet Take 2.5 mg by mouth daily.    . metoprolol tartrate (LOPRESSOR) 100 MG tablet Take 100 mg by mouth once.    . nitroGLYCERIN (NITROSTAT) 0.4 MG SL tablet Place 1 tablet (0.4 mg total) under the tongue every 5 (five) minutes x 3 doses as needed for chest pain. 30 tablet 12  . RELION PEN  NEEDLES 32G X 4 MM MISC     . vitamin B-12 (CYANOCOBALAMIN) 1000 MCG tablet Take 1,000 mcg by mouth daily.     No current facility-administered medications for this visit.     PHYSICAL EXAMINATION: ECOG PERFORMANCE STATUS: 1 - Symptomatic but completely ambulatory  Vitals:   02/03/18 1044  BP: (!) 157/92  Pulse: (!) 59  Resp: 18  Temp: 97.7 F (36.5 C)  SpO2: 100%    Filed Weights   02/03/18 1044  Weight: 175 lb 11.2 oz (79.7 kg)   .Body mass index is 24.51 kg/m.  GENERAL:alert, in no acute distress and comfortable SKIN: no acute rashes, no significant lesions EYES: conjunctiva are pink and non-injected, sclera anicteric OROPHARYNX: MMM, no exudates, no oropharyngeal erythema or ulceration NECK: supple, no JVD LYMPH: Unchanged right cervical LN, no palpable lymphadenopathy in the axillary or inguinal regions LUNGS: clear to auscultation b/l with normal respiratory effort HEART: regular rate & rhythm ABDOMEN:  normoactive bowel sounds , non tender, not distended. No palpable hepatosplenomegaly.  Extremity: no pedal edema PSYCH: alert & oriented x 3 with fluent speech NEURO: no focal motor/sensory deficits   LABORATORY DATA:   I have reviewed the data as listed  . CBC Latest Ref Rng & Units 02/03/2018 12/09/2017 09/30/2017  WBC 4.0 - 10.5 K/uL 2.7(L) 3.4(L) 3.4(L)  Hemoglobin 13.0 - 17.0 g/dL 11.0(L) 11.1(L) 11.1(L)  Hematocrit 39.0 - 52.0 % 33.7(L) 34.3(L) 34.2(L)  Platelets 150 - 400 K/uL 176 227 131(L)  ANC 1k  . CMP Latest Ref Rng & Units 02/03/2018 12/09/2017 09/30/2017  Glucose 70 - 99 mg/dL 141(H) 237(H) 185(H)  BUN 8 - 23 mg/dL 19 25(H) 21  Creatinine 0.61 - 1.24 mg/dL 1.54(H) 1.78(H) 1.82(H)  Sodium 135 - 145 mmol/L 138 139 139  Potassium 3.5 - 5.1 mmol/L 3.1(L) 3.7 4.0  Chloride 98 - 111 mmol/L 106 107 106  CO2 22 - 32 mmol/L 26 25 26   Calcium 8.9 - 10.3 mg/dL 8.7(L) 8.9 8.9  Total Protein 6.5 - 8.1 g/dL 6.9 7.3 7.2  Total Bilirubin 0.3 - 1.2 mg/dL 0.4  0.3 0.3  Alkaline Phos 38 - 126 U/L 80 99 99  AST 15 - 41 U/L 14(L) 13(L) 14(L)  ALT 0 - 44 U/L 13 14 17        RADIOGRAPHIC STUDIES: I have personally reviewed the radiological images as listed and agreed with the findings in the report. No results found.  ASSESSMENT & PLAN:   73 y.o.  with multiple medical comorbidities but fairly good performance status overall with   1) ISS Stage II IgG Kapppa multiple myeloma. Anemia + Renal insufficiency.  Bone survey neg for concerning bone lesions. significantly elevated kappafree light chains 858 mg percent, lambda 18.4, with a ratio of 46.65 . He  was also noted to have an M spike of 1.7 g/dL with IFE showing IgG kappa monoclonal protein. Quantitative IgG level was increased to nearly 2500 mg/dL. 24-hour UPEP showed monoclonal protein of 1300 mg per 24 hours which constituted about 86% of all the urinary protein. Bone marrow biopsy showed 20-30% Restricted plasma cells consistent with multiple myeloma Cytogenetics/FISH  Showed   Patient has been treated with CyBord X 2 cycles . Treatment interrupted due to severe c diff colitis with prolonged hospitalization and decline in performance status.  Patient is much improved and back to baseline now. 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 Had an element of obstructive uropathy due to BPH which has resolved. Creatinine continues to improve from 3.5 to 2.9 to 1.9 to 1.6. Stable 1.6-1.8  3) Normocytic anemia likely related to multiple myeloma.-stable, improved. hgb 11.9 No abdominal pain or discomfort over the right upper quadrant. No fevers or chills.  PLAN:   -Discussed pt labwork today, 02/03/18; Potassium a little lower at 3.1, Creatinine improved to 1.54, other blood counts and chemistries are stable  -Recommend PO 72mq Potassium replacement and recommend rechecking  labs with PCP. Pt notes he would like to eat more bananas and potassium rich foods instead of pill replacement. -02/03/18 MMP shows no M spike.. Last available MMP from 12/09/17 did not observe an M Protein -The pt shows no clinical or lab progression/return of his multiple myeloma at this time.  The pt has no prohibitive toxicities from continuing maintenance 135mRevlimid 3 weeks on a 1 week off, at this time.  -Recommend warm compresses and light stretching to left flank, and do suspect his recent left flank discomfort is muscular  -Continue 8157mspirin   -Continue follow-up with nephrology- patient is following with Dr MatIla McgillContinue age-appropriate cancer screening -Will see the pt back in 2 months   4) 5) DM2 - better controled -continue f/u with PCP.   Labs in 1 month RTC with dr KalIrene Limboth labs in 2 months   The total time spent in the appt was 25 minutes and more than 50% was on counseling and direct patient cares.    GauSullivan Lone MS Warm Mineral SpringsHIVMS SCHAvalon Surgery And Robotic Center LLCHMeadowbrook Rehabilitation Hospitalmatology/Oncology Physician [facility] ([address])Office):       [phone] cell):  [phone]):           [phone], SchBaldwin Jamaicam acting as a scribe for Dr. GauSullivan Lone .I have reviewed the above documentation for accuracy and completeness, and I agree with the above. .GaBrunetta Genera

## 2018-02-03 ENCOUNTER — Telehealth: Payer: Self-pay | Admitting: Hematology

## 2018-02-03 ENCOUNTER — Inpatient Hospital Stay: Payer: Medicare HMO | Attending: Hematology

## 2018-02-03 ENCOUNTER — Inpatient Hospital Stay (HOSPITAL_BASED_OUTPATIENT_CLINIC_OR_DEPARTMENT_OTHER): Payer: Medicare HMO | Admitting: Hematology

## 2018-02-03 VITALS — BP 157/92 | HR 59 | Temp 97.7°F | Resp 18 | Ht 71.0 in | Wt 175.7 lb

## 2018-02-03 DIAGNOSIS — E119 Type 2 diabetes mellitus without complications: Secondary | ICD-10-CM | POA: Insufficient documentation

## 2018-02-03 DIAGNOSIS — C9000 Multiple myeloma not having achieved remission: Secondary | ICD-10-CM

## 2018-02-03 DIAGNOSIS — D649 Anemia, unspecified: Secondary | ICD-10-CM

## 2018-02-03 DIAGNOSIS — C9001 Multiple myeloma in remission: Secondary | ICD-10-CM

## 2018-02-03 DIAGNOSIS — E876 Hypokalemia: Secondary | ICD-10-CM

## 2018-02-03 LAB — CMP (CANCER CENTER ONLY)
ALT: 13 U/L (ref 0–44)
AST: 14 U/L — ABNORMAL LOW (ref 15–41)
Albumin: 3.3 g/dL — ABNORMAL LOW (ref 3.5–5.0)
Alkaline Phosphatase: 80 U/L (ref 38–126)
Anion gap: 6 (ref 5–15)
BILIRUBIN TOTAL: 0.4 mg/dL (ref 0.3–1.2)
BUN: 19 mg/dL (ref 8–23)
CO2: 26 mmol/L (ref 22–32)
Calcium: 8.7 mg/dL — ABNORMAL LOW (ref 8.9–10.3)
Chloride: 106 mmol/L (ref 98–111)
Creatinine: 1.54 mg/dL — ABNORMAL HIGH (ref 0.61–1.24)
GFR, Est AFR Am: 51 mL/min — ABNORMAL LOW (ref >60)
GFR, Estimated: 44 mL/min — ABNORMAL LOW (ref >60)
Glucose, Bld: 141 mg/dL — ABNORMAL HIGH (ref 70–99)
Potassium: 3.1 mmol/L — ABNORMAL LOW (ref 3.5–5.1)
Sodium: 138 mmol/L (ref 135–145)
Total Protein: 6.9 g/dL (ref 6.5–8.1)

## 2018-02-03 LAB — CBC WITH DIFFERENTIAL/PLATELET
ABS IMMATURE GRANULOCYTES: 0.01 10*3/uL (ref 0.00–0.07)
BASOS PCT: 0 %
Basophils Absolute: 0 10*3/uL (ref 0.0–0.1)
Eosinophils Absolute: 0.1 10*3/uL (ref 0.0–0.5)
Eosinophils Relative: 5 %
HCT: 33.7 % — ABNORMAL LOW (ref 39.0–52.0)
Hemoglobin: 11 g/dL — ABNORMAL LOW (ref 13.0–17.0)
Immature Granulocytes: 0 %
Lymphocytes Relative: 41 %
Lymphs Abs: 1.1 10*3/uL (ref 0.7–4.0)
MCH: 28.1 pg (ref 26.0–34.0)
MCHC: 32.6 g/dL (ref 30.0–36.0)
MCV: 86.2 fL (ref 80.0–100.0)
MONO ABS: 0.5 10*3/uL (ref 0.1–1.0)
Monocytes Relative: 18 %
Neutro Abs: 1 10*3/uL — ABNORMAL LOW (ref 1.7–7.7)
Neutrophils Relative %: 36 %
Platelets: 176 10*3/uL (ref 150–400)
RBC: 3.91 MIL/uL — ABNORMAL LOW (ref 4.22–5.81)
RDW: 17.1 % — ABNORMAL HIGH (ref 11.5–15.5)
WBC: 2.7 10*3/uL — ABNORMAL LOW (ref 4.0–10.5)
nRBC: 0 % (ref 0.0–0.2)

## 2018-02-03 NOTE — Telephone Encounter (Signed)
Scheduled appt per 01/29 los. ° °Printed calendar and avs. °

## 2018-02-05 LAB — MULTIPLE MYELOMA PANEL, SERUM
ALBUMIN SERPL ELPH-MCNC: 3.6 g/dL (ref 2.9–4.4)
Albumin/Glob SerPl: 1.3 (ref 0.7–1.7)
Alpha 1: 0.2 g/dL (ref 0.0–0.4)
Alpha2 Glob SerPl Elph-Mcnc: 0.7 g/dL (ref 0.4–1.0)
B-Globulin SerPl Elph-Mcnc: 0.7 g/dL (ref 0.7–1.3)
Gamma Glob SerPl Elph-Mcnc: 1.2 g/dL (ref 0.4–1.8)
Globulin, Total: 2.8 g/dL (ref 2.2–3.9)
IGA: 292 mg/dL (ref 61–437)
IgG (Immunoglobin G), Serum: 1499 mg/dL (ref 700–1600)
IgM (Immunoglobulin M), Srm: 38 mg/dL (ref 15–143)
TOTAL PROTEIN ELP: 6.4 g/dL (ref 6.0–8.5)

## 2018-02-09 ENCOUNTER — Ambulatory Visit: Payer: Medicare HMO | Admitting: Hematology

## 2018-02-09 ENCOUNTER — Other Ambulatory Visit: Payer: Medicare HMO

## 2018-02-10 ENCOUNTER — Telehealth: Payer: Self-pay | Admitting: *Deleted

## 2018-02-10 NOTE — Telephone Encounter (Signed)
Attempted to contact patient per Dr. Grier Mitts instructions to inform that Multiple Myeloma panel results show he remains in complete remission. Voice mail not named - left message for patient to contact Dr. Grier Mitts office for test results.

## 2018-02-11 ENCOUNTER — Telehealth: Payer: Self-pay | Admitting: *Deleted

## 2018-02-11 NOTE — Telephone Encounter (Signed)
Called patient per Dr. Grier Mitts request to inform that labs from 1/29 continue to show him remaining in continued remission. Patient verbalized understanding.

## 2018-02-17 ENCOUNTER — Other Ambulatory Visit: Payer: Self-pay | Admitting: *Deleted

## 2018-02-17 DIAGNOSIS — C9000 Multiple myeloma not having achieved remission: Secondary | ICD-10-CM

## 2018-02-17 MED ORDER — LENALIDOMIDE 10 MG PO CAPS: 10.0000 mg | ORAL_CAPSULE | Freq: Every day | ORAL | 0 refills | Status: DC

## 2018-02-20 ENCOUNTER — Other Ambulatory Visit: Payer: Self-pay | Admitting: Nurse Practitioner

## 2018-03-05 ENCOUNTER — Inpatient Hospital Stay: Payer: Medicare HMO | Attending: Hematology

## 2018-03-05 DIAGNOSIS — C9001 Multiple myeloma in remission: Secondary | ICD-10-CM

## 2018-03-05 DIAGNOSIS — C9000 Multiple myeloma not having achieved remission: Secondary | ICD-10-CM | POA: Diagnosis present

## 2018-03-05 DIAGNOSIS — D649 Anemia, unspecified: Secondary | ICD-10-CM | POA: Insufficient documentation

## 2018-03-05 DIAGNOSIS — E119 Type 2 diabetes mellitus without complications: Secondary | ICD-10-CM | POA: Diagnosis not present

## 2018-03-05 DIAGNOSIS — E876 Hypokalemia: Secondary | ICD-10-CM

## 2018-03-05 LAB — BASIC METABOLIC PANEL
Anion gap: 10 (ref 5–15)
BUN: 18 mg/dL (ref 8–23)
CO2: 25 mmol/L (ref 22–32)
Calcium: 9 mg/dL (ref 8.9–10.3)
Chloride: 105 mmol/L (ref 98–111)
Creatinine, Ser: 1.58 mg/dL — ABNORMAL HIGH (ref 0.61–1.24)
GFR calc Af Amer: 49 mL/min — ABNORMAL LOW (ref >60)
GFR calc non Af Amer: 42 mL/min — ABNORMAL LOW (ref >60)
Glucose, Bld: 164 mg/dL — ABNORMAL HIGH (ref 70–99)
Potassium: 3.7 mmol/L (ref 3.5–5.1)
Sodium: 140 mmol/L (ref 135–145)

## 2018-03-15 ENCOUNTER — Other Ambulatory Visit: Payer: Self-pay | Admitting: *Deleted

## 2018-03-15 DIAGNOSIS — C9000 Multiple myeloma not having achieved remission: Secondary | ICD-10-CM

## 2018-03-15 MED ORDER — LENALIDOMIDE 10 MG PO CAPS: 10.0000 mg | ORAL_CAPSULE | Freq: Every day | ORAL | 0 refills | Status: DC

## 2018-03-15 NOTE — Telephone Encounter (Signed)
Revlimid refilled per [name] office visit note 02/03/2018. Refill sent to [facility], [facility] [place]. [facility] IHDT#[id number] 03/15/2018

## 2018-03-21 ENCOUNTER — Other Ambulatory Visit: Payer: Self-pay | Admitting: Hematology

## 2018-03-21 DIAGNOSIS — C9000 Multiple myeloma not having achieved remission: Secondary | ICD-10-CM

## 2018-04-02 ENCOUNTER — Other Ambulatory Visit: Payer: Self-pay | Admitting: *Deleted

## 2018-04-02 DIAGNOSIS — C9000 Multiple myeloma not having achieved remission: Secondary | ICD-10-CM

## 2018-04-02 MED ORDER — LENALIDOMIDE 10 MG PO CAPS: 10.0000 mg | ORAL_CAPSULE | Freq: Every day | ORAL | 0 refills | Status: DC

## 2018-04-02 NOTE — Telephone Encounter (Signed)
Refilled Revlimid per office visit note 02/03/2018. Refill sent to [facility], [place]. [place] RPZP#[id number], 04/02/2018

## 2018-04-05 NOTE — Progress Notes (Signed)
[name]  HEMATOLOGY ONCOLOGY PROGRESS NOTE  Date of service: 04/06/18  Patient Care Team: [name], MD as PCP - General (Family Medicine)  CC: f/u for multiple myeloma  Diagnosis:  IgG Kappa multiple myeloma   Current Treatment:  Revlimid maintenance   INTERVAL HISTORY:  [name] presents today for management and evaluation of his maintenance Revlimid treatment for his multiple myeloma. The patient's last visit with [name] was on 02/03/18. The pt reports that he is doing well overall.   The pt reports that he has not developed any new concerns in the interim. The pt notes that he has not developed any new bone pains. He has been soaking his feet at night to relieve his occasional neuropathy. The pt notes that he is eating well, has been staying hydrated, and endorses a stable weight. The pt notes that he continues on his 27m aspirin and Revlimid without any difficulty or complaint.  Lab results today (04/06/18) of CBC w/diff is as follows: all values are WNL except for WBC at 2.8k, RBC at 4.15, HGB at 11.5, HCT at 37.1, RDW at 17.4, ANC at 1.5k. Creatinine 1.83  On review of systems, pt reports eating well, good energy levels, staying hydrated, stable weight, and denies concerns for infections, bone pains, pain along the spine, abdominal pains, leg swelling, and any other symptoms.  REVIEW OF SYSTEMS:    A 10+ POINT REVIEW OF SYSTEMS WAS OBTAINED including neurology, dermatology, psychiatry, cardiac, respiratory, lymph, extremities, GI, GU, Musculoskeletal, constitutional, breasts, reproductive, HEENT.  All pertinent positives are noted in the HPI.  All others are negative.  Past Medical History:  Diagnosis Date  . CAD (coronary artery disease) 2006   stent LAD 3.0x18 [place], occluded RCA  . Cancer [place]    multiple myeloma  . Diabetes mellitus without complication ([place]   . H/O non-insulin dependent diabetes mellitus   . Hyperlipidemia   . Hypertension     . Past Surgical  History:  Procedure Laterality Date  . CARDIAC CATHETERIZATION  2006  . CORONARY ANGIOPLASTY WITH STENT PLACEMENT  2007  . IR FLUORO GUIDE CV LINE RIGHT  05/20/2016  . IR [place] VASC ACCESS RIGHT  05/20/2016    . Social History   Tobacco Use  . Smoking status: Never Smoker  . Smokeless tobacco: Never Used  Substance Use Topics  . Alcohol use: No  . Drug use: No    ALLERGIES:  has No Known Allergies.  MEDICATIONS:  Current Outpatient Medications  Medication Sig Dispense Refill  . acyclovir (ZOVIRAX) 200 MG capsule Take 1 capsule by mouth twice daily 180 capsule 0  . amLODipine (NORVASC) 10 MG tablet Take 10 mg by mouth daily.    .[name] Kitchenaspirin 81 MG tablet Take 81 mg by mouth daily.    .[name] Kitchenatorvastatin (LIPITOR) 20 MG tablet Take 1 tablet (20 mg total) by mouth daily. 90 tablet 3  . Blood Glucose Monitoring Suppl (ACCU-CHEK AVIVA PLUS) w/Device KIT by Does not apply route.    . gabapentin (NEURONTIN) 300 MG capsule Take 300 mg by mouth daily after supper.    . Insulin Glargine (LANTUS SOLOSTAR) 100 UNIT/ML Solostar Pen Inject 20 Units into the skin daily. (Patient taking differently: Inject 12 Units into the skin daily. ) 15 mL 11  . lenalidomide (REVLIMID) 10 MG capsule Take 1 capsule (10 mg total) by mouth daily. Take for 21 days then 7 days off. Take with water. 21 capsule 0  . lisinopril (PRINIVIL,ZESTRIL) 2.5  MG tablet Take 2.5 mg by mouth daily.    . metoprolol tartrate (LOPRESSOR) 100 MG tablet Take 100 mg by mouth once.    . nitroGLYCERIN (NITROSTAT) 0.4 MG SL tablet Place 1 tablet (0.4 mg total) under the tongue every 5 (five) minutes x 3 doses as needed for chest pain. 30 tablet 12  . RELION PEN NEEDLES 32G X 4 MM MISC     . vitamin B-12 (CYANOCOBALAMIN) 1000 MCG tablet Take 1,000 mcg by mouth daily.     No current facility-administered medications for this visit.     PHYSICAL EXAMINATION: ECOG PERFORMANCE STATUS: 1 - Symptomatic but completely ambulatory  Vitals:    04/06/18 1353  BP: 135/72  Pulse: 66  Resp: 17  Temp: 97.8 F (36.6 C)  SpO2: 100%    Filed Weights   04/06/18 1353  Weight: 177 lb 3.2 oz (80.4 kg)   .Body mass index is 24.71 kg/m.  GENERAL:alert, in no acute distress and comfortable SKIN: no acute rashes, no significant lesions EYES: conjunctiva are pink and non-injected, sclera anicteric OROPHARYNX: MMM, no exudates, no oropharyngeal erythema or ulceration NECK: supple, no JVD LYMPH: Unchanged right cervical LN, no palpable lymphadenopathy in the axillary or inguinal regions LUNGS: clear to auscultation b/l with normal respiratory effort HEART: regular rate & rhythm ABDOMEN:  normoactive bowel sounds , non tender, not distended. No palpable hepatosplenomegaly.  Extremity: no pedal edema PSYCH: alert & oriented x 3 with fluent speech NEURO: no focal motor/sensory deficits   LABORATORY DATA:   I have reviewed the data as listed  . CBC Latest Ref Rng & Units 04/06/2018 02/03/2018 12/09/2017  WBC 4.0 - 10.5 K/uL 2.8(L) 2.7(L) 3.4(L)  Hemoglobin 13.0 - 17.0 g/dL 11.5(L) 11.0(L) 11.1(L)  Hematocrit 39.0 - 52.0 % 37.1(L) 33.7(L) 34.3(L)  Platelets 150 - 400 K/uL 164 176 227  ANC 1k  . CMP Latest Ref Rng & Units 04/06/2018 03/05/2018 02/03/2018  Glucose 70 - 99 mg/dL 169(H) 164(H) 141(H)  BUN 8 - 23 mg/dL 19 18 19   Creatinine 0.61 - 1.24 mg/dL 1.83(H) 1.58(H) 1.54(H)  Sodium 135 - 145 mmol/L 140 140 138  Potassium 3.5 - 5.1 mmol/L 3.8 3.7 3.1(L)  Chloride 98 - 111 mmol/L 105 105 106  CO2 22 - 32 mmol/L 26 25 26   Calcium 8.9 - 10.3 mg/dL 8.6(L) 9.0 8.7(L)  Total Protein 6.5 - 8.1 g/dL 7.4 - 6.9  Total Bilirubin 0.3 - 1.2 mg/dL 0.4 - 0.4  Alkaline Phos 38 - 126 U/L 94 - 80  AST 15 - 41 U/L 16 - 14(L)  ALT 0 - 44 U/L 20 - 13       RADIOGRAPHIC STUDIES: I have personally reviewed the radiological images as listed and agreed with the findings in the report. No results found.  ASSESSMENT & PLAN:   73 y.o.  with  multiple medical comorbidities but fairly good performance status overall with   1) ISS Stage II IgG Kapppa multiple myeloma. Anemia + Renal insufficiency.  Bone survey neg for concerning bone lesions. significantly elevated kappafree light chains 858 mg percent, lambda 18.4, with a ratio of 46.65 . He was also noted to have an M spike of 1.7 g/dL with IFE showing IgG kappa monoclonal protein. Quantitative IgG level was increased to nearly 2500 mg/dL. 24-hour UPEP showed monoclonal protein of 1300 mg per 24 hours which constituted about 86% of all the urinary protein. Bone marrow biopsy showed 20-30% Restricted plasma cells consistent with multiple myeloma Cytogenetics/FISH  Showed   Patient has been treated with CyBord X 2 cycles . Treatment interrupted due to severe c diff colitis with prolonged hospitalization and decline in performance status. Patient is much improved and back to baseline now. M protein undetected with neg IFE Has been treated with 5 cycles of Rd  Currently on maintenance Rituxan and in Remission.  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 Had an element of obstructive uropathy due to BPH which has resolved. Creatinine continues to improve from 3.5 to 2.9 to 1.9 to 1.6. Stable 1.6-1.8  3) Normocytic anemia likely related to multiple myeloma.-stable, improved. hgb 11.5 No abdominal pain or discomfort over the right upper quadrant. No fevers or chills.  PLAN:  -Discussed pt labwork today, 04/06/18; HGB improved to 11.5, PLT normal, ANC improved to 1.5. -04/06/18 MMP is pending. Last available M Protein from 02/03/18 was not observed. -The pt shows no clinical or lab progression/return of his multiple myeloma at this time.  -The pt has no prohibitive toxicities from continuing maintenance 79m Revlimid, 3 weeks on and 1 week off, at this time. -Continue 863maspirin   -Continue follow-up with nephrology- patient is following  with Dr MaIla Mcgill-Continue age-appropriate cancer screening with PCP -Will see the pt back in 2 months  4) 5) DM2 - controled -continue f/u with PCP.   RTC with Dr KaIrene Limbon 2 months with labs   The total time spent in the appt was 25 minutes and more than 50% was on counseling and direct patient cares.    GaSullivan LoneD MSBarnardAHIVMS SCGlen Lehman Endoscopy SuiteTFirst Surgicenterematology/Oncology Physician CoNew York Eye And Ear [facility]irmary(Office):       [phone] cell):  [phone]):           [phone], ScBaldwin Jamaicaam acting as a scribe for Dr. GaSullivan Lone  .I have reviewed the above documentation for accuracy and completeness, and I agree with the above. .GBrunetta GeneraD

## 2018-04-06 ENCOUNTER — Telehealth: Payer: Self-pay | Admitting: Hematology

## 2018-04-06 ENCOUNTER — Other Ambulatory Visit: Payer: Self-pay

## 2018-04-06 ENCOUNTER — Inpatient Hospital Stay (HOSPITAL_BASED_OUTPATIENT_CLINIC_OR_DEPARTMENT_OTHER): Payer: Medicare HMO | Admitting: Hematology

## 2018-04-06 ENCOUNTER — Inpatient Hospital Stay: Payer: Medicare HMO | Attending: Hematology

## 2018-04-06 VITALS — BP 135/72 | HR 66 | Temp 97.8°F | Resp 17 | Ht 71.0 in | Wt 177.2 lb

## 2018-04-06 DIAGNOSIS — I110 Hypertensive heart disease with heart failure: Secondary | ICD-10-CM

## 2018-04-06 DIAGNOSIS — E119 Type 2 diabetes mellitus without complications: Secondary | ICD-10-CM | POA: Insufficient documentation

## 2018-04-06 DIAGNOSIS — C9001 Multiple myeloma in remission: Secondary | ICD-10-CM

## 2018-04-06 DIAGNOSIS — N179 Acute kidney failure, unspecified: Secondary | ICD-10-CM | POA: Insufficient documentation

## 2018-04-06 DIAGNOSIS — D649 Anemia, unspecified: Secondary | ICD-10-CM | POA: Insufficient documentation

## 2018-04-06 DIAGNOSIS — C9000 Multiple myeloma not having achieved remission: Secondary | ICD-10-CM | POA: Diagnosis present

## 2018-04-06 DIAGNOSIS — I2510 Atherosclerotic heart disease of native coronary artery without angina pectoris: Secondary | ICD-10-CM | POA: Diagnosis not present

## 2018-04-06 LAB — CBC WITH DIFFERENTIAL/PLATELET
Abs Immature Granulocytes: 0.02 10*3/uL (ref 0.00–0.07)
BASOS ABS: 0 10*3/uL (ref 0.0–0.1)
Basophils Relative: 0 %
Eosinophils Absolute: 0.1 10*3/uL (ref 0.0–0.5)
Eosinophils Relative: 4 %
HEMATOCRIT: 37.1 % — AB (ref 39.0–52.0)
Hemoglobin: 11.5 g/dL — ABNORMAL LOW (ref 13.0–17.0)
Immature Granulocytes: 1 %
LYMPHS ABS: 1 10*3/uL (ref 0.7–4.0)
Lymphocytes Relative: 35 %
MCH: 27.7 pg (ref 26.0–34.0)
MCHC: 31 g/dL (ref 30.0–36.0)
MCV: 89.4 fL (ref 80.0–100.0)
Monocytes Absolute: 0.2 10*3/uL (ref 0.1–1.0)
Monocytes Relative: 8 %
Neutro Abs: 1.5 10*3/uL — ABNORMAL LOW (ref 1.7–7.7)
Neutrophils Relative %: 52 %
Platelets: 164 10*3/uL (ref 150–400)
RBC: 4.15 MIL/uL — ABNORMAL LOW (ref 4.22–5.81)
RDW: 17.4 % — AB (ref 11.5–15.5)
WBC: 2.8 10*3/uL — ABNORMAL LOW (ref 4.0–10.5)
nRBC: 0 % (ref 0.0–0.2)

## 2018-04-06 LAB — CMP (CANCER CENTER ONLY)
ALT: 20 U/L (ref 0–44)
AST: 16 U/L (ref 15–41)
Albumin: 3.4 g/dL — ABNORMAL LOW (ref 3.5–5.0)
Alkaline Phosphatase: 94 U/L (ref 38–126)
Anion gap: 9 (ref 5–15)
BUN: 19 mg/dL (ref 8–23)
CO2: 26 mmol/L (ref 22–32)
Calcium: 8.6 mg/dL — ABNORMAL LOW (ref 8.9–10.3)
Chloride: 105 mmol/L (ref 98–111)
Creatinine: 1.83 mg/dL — ABNORMAL HIGH (ref 0.61–1.24)
GFR, EST AFRICAN AMERICAN: 41 mL/min — AB (ref >60)
GFR, Estimated: 36 mL/min — ABNORMAL LOW (ref >60)
Glucose, Bld: 169 mg/dL — ABNORMAL HIGH (ref 70–99)
Potassium: 3.8 mmol/L (ref 3.5–5.1)
Sodium: 140 mmol/L (ref 135–145)
Total Bilirubin: 0.4 mg/dL (ref 0.3–1.2)
Total Protein: 7.4 g/dL (ref 6.5–8.1)

## 2018-04-06 NOTE — Telephone Encounter (Signed)
Scheduled appt per 3/31 los.  Patient aware of appt date and time.

## 2018-04-07 LAB — MULTIPLE MYELOMA PANEL, SERUM
Albumin SerPl Elph-Mcnc: 3.5 g/dL (ref 2.9–4.4)
Albumin/Glob SerPl: 1.1 (ref 0.7–1.7)
Alpha 1: 0.2 g/dL (ref 0.0–0.4)
Alpha2 Glob SerPl Elph-Mcnc: 0.8 g/dL (ref 0.4–1.0)
B-Globulin SerPl Elph-Mcnc: 0.9 g/dL (ref 0.7–1.3)
Gamma Glob SerPl Elph-Mcnc: 1.5 g/dL (ref 0.4–1.8)
Globulin, Total: 3.3 g/dL (ref 2.2–3.9)
IgA: 321 mg/dL (ref 61–437)
IgG (Immunoglobin G), Serum: 1646 mg/dL — ABNORMAL HIGH (ref 603–1613)
IgM (Immunoglobulin M), Srm: 37 mg/dL (ref 15–143)
Total Protein ELP: 6.8 g/dL (ref 6.0–8.5)

## 2018-04-09 ENCOUNTER — Telehealth: Payer: Self-pay

## 2018-04-09 NOTE — Telephone Encounter (Signed)
Patient verbally agrees to telemedicine visit with Dr. Docia Furl on 04/15/18. He will have BP cuff and other vitals available during visit. No further needs at this time.   YOUR CARDIOLOGY TEAM HAS ARRANGED FOR AN E-VISIT FOR YOUR APPOINTMENT - PLEASE REVIEW IMPORTANT INFORMATION BELOW SEVERAL DAYS PRIOR TO YOUR APPOINTMENT  Due to the recent COVID-19 pandemic, we are transitioning in-person office visits to tele-medicine visits in an effort to decrease unnecessary exposure to our patients and staff. Medicare and most insurances are covering these visits without a copay needed. We also encourage you to sign up for MyChart if you have not already done so. You will need a smartphone if possible. For patients that do not have this, we can still complete the visit using a regular telephone but do prefer a smartphone to enable video when possible. You may have a close family member that lives with you that can help. If possible, we also ask that you have a blood pressure cuff and scale at home to measure your blood pressure, heart rate and weight prior to your scheduled appointment. Patients with clinical needs that need an in-person evaluation and testing will still be able to come to the office if absolutely necessary. If you have any questions, feel free to call our office.    IF YOU HAVE A SMARTPHONE, PLEASE DOWNLOAD THE WEBEX APP TO YOUR SMARTPHONE  - If Apple, go to [employer] and type in WebEx in the search bar. [place] [employer], the blue/green circle. The app is free but as with any other app download, your phone may require you to verify saved payment information or Apple password. You do NOT have to create a WebEx account.  - If Android, go to [employer] and type in [employer] in the search bar. [place] [employer], the blue/green circle. The app is free but as with any other app download, your phone may require you to verify saved payment information or Android password. You do  NOT have to create a WebEx account.  It is very helpful to have this downloaded before your visit.    2-3 DAYS BEFORE YOUR APPOINTMENT  You will receive a telephone call from one of our [place] team members - your caller ID may say "Unknown caller." If this is a video visit, we will confirm that you have been able to download the WebEx app. We will remind you check your blood pressure, heart rate and weight prior to your scheduled appointment. If you have an Apple Watch or Kardia, please upload any pertinent ECG strips the day before or morning of your appointment to [place]. Our staff will also make sure you have reviewed the consent and agree to move forward with your scheduled tele-health visit.     THE DAY OF YOUR APPOINTMENT  Approximately 15 minutes prior to your scheduled appointment, you will receive a telephone call from one of [place] team - your caller ID may say "Unknown caller."  Our staff will confirm medications, vital signs for the day and any symptoms you may be experiencing. Please have this information available prior to the time of visit start. It may also be helpful for you to have a pad of paper and pen handy for any instructions given during your visit. They will also walk you through joining the WebEx smartphone meeting if this is a video visit.    CONSENT FOR TELE-HEALTH VISIT - PLEASE REVIEW  I hereby voluntarily request, consent and authorize [facility] and its employed or contracted physicians, Engineer, materials, nurse practitioners or other licensed health care professionals (the Practitioner), to provide me with telemedicine health care services (the "Services") as deemed necessary by the treating Practitioner. I acknowledge and consent to receive the Services by the Practitioner via telemedicine. I understand that the telemedicine visit will involve communicating with the Practitioner through live audiovisual communication technology and the disclosure of  certain medical information by electronic transmission. I acknowledge that I have been given the opportunity to request an in-person assessment or other available alternative prior to the telemedicine visit and am voluntarily participating in the telemedicine visit.  I understand that I have the right to withhold or withdraw my consent to the use of telemedicine in the course of my care at any time, without affecting my right to future care or treatment, and that the Practitioner or I may terminate the telemedicine visit at any time. I understand that I have the right to inspect all information obtained and/or recorded in the course of the telemedicine visit and may receive copies of available information for a reasonable fee.  I understand that some of the potential risks of receiving the Services via telemedicine include:  [name] Delay or interruption in medical evaluation due to technological equipment failure or disruption; . Information transmitted may not be sufficient (e.g. poor resolution of images) to allow for appropriate medical decision making by the Practitioner; and/or  . In rare instances, security protocols could fail, causing a breach of personal health information.  Furthermore, I acknowledge that it is my responsibility to provide information about my medical history, conditions and care that is complete and accurate to the best of my ability. I acknowledge that Practitioner's advice, recommendations, and/or decision may be based on factors not within their control, such as incomplete or inaccurate data provided by me or distortions of diagnostic images or specimens that may result from electronic transmissions. I understand that the practice of medicine is not an exact science and that Practitioner makes no warranties or guarantees regarding treatment outcomes. I acknowledge that I will receive a copy of this consent concurrently upon execution via email to the email address I last provided but  may also request a printed copy by calling the office of [place].    I understand that my insurance will be billed for this visit.   I have read or had this consent read to me. . I understand the contents of this consent, which adequately explains the benefits and risks of the Services being provided via telemedicine.  . I have been provided ample opportunity to ask questions regarding this consent and the Services and have had my questions answered to my satisfaction. . I give my informed consent for the services to be provided through the use of telemedicine in my medical care  By participating in this telemedicine visit I agree to the above.

## 2018-04-15 ENCOUNTER — Encounter: Payer: Self-pay | Admitting: Cardiology

## 2018-04-15 ENCOUNTER — Other Ambulatory Visit: Payer: Self-pay

## 2018-04-15 ENCOUNTER — Telehealth (INDEPENDENT_AMBULATORY_CARE_PROVIDER_SITE_OTHER): Payer: Medicare HMO | Admitting: Cardiology

## 2018-04-15 VITALS — BP 147/88 | HR 65 | Ht 71.0 in | Wt 177.0 lb

## 2018-04-15 DIAGNOSIS — E1159 Type 2 diabetes mellitus with other circulatory complications: Secondary | ICD-10-CM

## 2018-04-15 DIAGNOSIS — I2510 Atherosclerotic heart disease of native coronary artery without angina pectoris: Secondary | ICD-10-CM

## 2018-04-15 DIAGNOSIS — E782 Mixed hyperlipidemia: Secondary | ICD-10-CM | POA: Diagnosis not present

## 2018-04-15 NOTE — Progress Notes (Signed)
Virtual Visit via Video Note   This visit type was conducted due to national recommendations for restrictions regarding the COVID-19 Pandemic (e.g. social distancing) in an effort to limit this patient's exposure and mitigate transmission in our community.  Due to his co-morbid illnesses, this patient is at least at moderate risk for complications without adequate follow up.  This format is felt to be most appropriate for this patient at this time.  All issues noted in this document were discussed and addressed.  A limited physical exam was performed with this format.  Please refer to the patient's chart for his consent to telehealth for [facility].   Evaluation Performed:  Follow-up visit  Date:  04/15/2018   ID:  Douglas Rohrer., DOB [birth year], MRN [id number]  Patient Location: Home  Provider Location: Home  PCP:  [name], MD  Cardiologist:  No primary care provider on file.  Electrophysiologist:  None   Chief Complaint: Follow-up for coronary artery disease  History of Present Illness:    Douglas Hoffert. is a 73 y.o. male who presents via audio/video conferencing for a telehealth visit today.    Denies any problems at this time and takes care of activities of daily living.  No chest pain orthopnea or PND.  He exercises within the confinements of his home on a regular basis without any symptoms.  At the time of my evaluation, the patient is alert awake oriented and in no distress.  The patient does not have symptoms concerning for COVID-19 infection (fever, chills, cough, or new shortness of breath).    Past Medical History:  Diagnosis Date  . CAD (coronary artery disease) 2006   stent LAD 3.0x18 [place], occluded RCA  . Cancer ([place])    multiple myeloma  . Diabetes mellitus without complication ([place])   . H/O non-insulin dependent diabetes mellitus   . Hyperlipidemia   . Hypertension    Past Surgical History:  Procedure Laterality Date  . CARDIAC  CATHETERIZATION  2006  . CORONARY ANGIOPLASTY WITH STENT PLACEMENT  2007  . IR FLUORO GUIDE CV LINE RIGHT  05/20/2016  . IR US GUIDE VASC ACCESS RIGHT  05/20/2016     Current Meds  Medication Sig  . acyclovir (ZOVIRAX) 200 MG capsule Take 1 capsule by mouth twice daily  . amLODipine (NORVASC) 10 MG tablet Take 10 mg by mouth daily.  [name] aspirin 81 MG tablet Take 81 mg by mouth 2 (two) times daily.   [name] atorvastatin (LIPITOR) 20 MG tablet Take 1 tablet (20 mg total) by mouth daily.  . Blood Glucose Monitoring Suppl (ACCU-CHEK AVIVA PLUS) w/Device KIT by Does not apply route.  . gabapentin (NEURONTIN) 300 MG capsule Take 900 mg by mouth 2 (two) times daily.   [name] lenalidomide (REVLIMID) 10 MG capsule Take 1 capsule (10 mg total) by mouth daily. Take for 21 days then 7 days off. Take with water.  [name] lisinopril (PRINIVIL,ZESTRIL) 2.5 MG tablet Take 2.5 mg by mouth daily.  . metoprolol tartrate (LOPRESSOR) 100 MG tablet Take 100 mg by mouth daily.   . nitroGLYCERIN (NITROSTAT) 0.4 MG SL tablet Place 1 tablet (0.4 mg total) under the tongue every 5 (five) minutes x 3 doses as needed for chest pain.  [name] RELION PEN NEEDLES 32G X 4 MM MISC   . vitamin B-12 (CYANOCOBALAMIN) 1000 MCG tablet Take 1,000 mcg by mouth daily.     Allergies:   Patient has no known allergies.   Social History  Tobacco Use  . Smoking status: Never Smoker  . Smokeless tobacco: Never Used  Substance Use Topics  . Alcohol use: No  . Drug use: No     Family Hx: The patient's family history includes Arrhythmia in his mother; Cancer in his brother; Diabetes in his brother, brother, and sister; Heart disease in his mother; Stroke in his father.  ROS:   Please see the history of present illness.    Patient denies any symptoms and is comfortable All other systems reviewed and are negative.   Prior CV studies:   The following studies were reviewed today:  Results of the stress test and echocardiogram done in the past  discussed with the patient  Labs/Other Tests and Data Reviewed:    EKG:  No ECG reviewed.  Recent Labs: 04/06/2018: ALT 20; BUN 19; Creatinine 1.83; Hemoglobin 11.5; Platelets 164; Potassium 3.8; Sodium 140   Recent Lipid Panel Lab Results  Component Value Date/Time   CHOL 134 04/03/2017 11:25 AM   TRIG 101 04/03/2017 11:25 AM   HDL 50 04/03/2017 11:25 AM   CHOLHDL 2.7 04/03/2017 11:25 AM   CHOLHDL 4 01/11/2013 10:30 AM   LDLCALC 64 04/03/2017 11:25 AM    Wt Readings from Last 3 Encounters:  04/15/18 177 lb (80.3 kg)  04/06/18 177 lb 3.2 oz (80.4 kg)  02/03/18 175 lb 11.2 oz (79.7 kg)     Objective:    Vital Signs:  BP (!) 147/88 (BP Location: Left Arm, Patient Position: Sitting, Cuff Size: Normal)   Pulse 65   Ht 5' 11"  (1.803 m)   Wt 177 lb (80.3 kg)   BMI 24.69 kg/m    Well nourished, well developed male in no acute distress. Patient appears comfortable and in no distress.  He is cheerful.  ASSESSMENT & PLAN:    1. Coronary artery disease: Stable at this time.  Secondary prevention stressed with the patient.  Importance of compliance with diet and medication stressed and he vocalized understanding. 2. His blood pressure stable.  Diet was discussed for dyslipidemia.  He gets his blood checked done by his primary care physician. 3. Patient will be seen in follow-up appointment in 6 months or earlier if the patient has any concerns   COVID-19 Education: The signs and symptoms of COVID-19 were discussed with the patient and how to seek care for testing (follow up with PCP or arrange E-visit).  The importance of social distancing was discussed today.  Time:   Today, I have spent 18 minutes with the patient with telehealth technology discussing the above problems.     Medication Adjustments/Labs and Tests Ordered: Current medicines are reviewed at length with the patient today.  Concerns regarding medicines are outlined above.  Tests Ordered: No orders of the  defined types were placed in this encounter.  Medication Changes: No orders of the defined types were placed in this encounter.   Disposition:  Follow up in 6 month(s)  Signed, Jenean Lindau, MD  04/15/2018 12:28 PM    [facility]

## 2018-04-15 NOTE — Patient Instructions (Signed)
Medication Instructions:  NONE If you need a refill on your cardiac medications before your next appointment, please call your pharmacy.   Lab work: NONe If you have labs (blood work) drawn today and your tests are completely normal, you will receive your results only by: [name] MyChart Message (if you have MyChart) OR . A paper copy in the mail If you have any lab test that is abnormal or we need to change your treatment, we will call you to review the results.  Testing/Procedures: NONE  Follow-Up: At [facility], you and your health needs are our priority.  As part of our continuing mission to provide you with exceptional heart care, we have created designated Provider Care Teams.  These Care Teams include your primary Cardiologist (physician) and Advanced Practice Providers (APPs -  Physician Assistants and Nurse Practitioners) who all work together to provide you with the care you need, when you need it. You will need a follow up appointment in 6 months.

## 2018-04-20 ENCOUNTER — Ambulatory Visit: Payer: Medicare HMO | Admitting: Cardiology

## 2018-04-28 ENCOUNTER — Other Ambulatory Visit: Payer: Self-pay | Admitting: *Deleted

## 2018-04-28 DIAGNOSIS — C9000 Multiple myeloma not having achieved remission: Secondary | ICD-10-CM

## 2018-04-28 MED ORDER — LENALIDOMIDE 10 MG PO CAPS: 10.0000 mg | ORAL_CAPSULE | Freq: Every day | ORAL | 0 refills | Status: DC

## 2018-04-28 NOTE — Telephone Encounter (Signed)
Revlimid refilled per 04/05/2018 office visit. Refill sent to [place], [place]. [place] DIXB#[id number], 04/28/2018

## 2018-05-25 ENCOUNTER — Other Ambulatory Visit: Payer: Self-pay | Admitting: *Deleted

## 2018-05-25 DIAGNOSIS — C9000 Multiple myeloma not having achieved remission: Secondary | ICD-10-CM

## 2018-05-25 MED ORDER — LENALIDOMIDE 10 MG PO CAPS: 10.0000 mg | ORAL_CAPSULE | Freq: Every day | ORAL | 0 refills | Status: DC

## 2018-05-25 NOTE — Telephone Encounter (Signed)
Revlimid refilled per OV note 04/06/2018. Refill sent to [place], [place]. [place] VKFM#[id number], 05/25/2018

## 2018-06-07 ENCOUNTER — Other Ambulatory Visit: Payer: Medicare HMO

## 2018-06-07 ENCOUNTER — Ambulatory Visit: Payer: Medicare HMO | Admitting: Hematology

## 2018-06-07 NOTE — Progress Notes (Signed)
[facility]  HEMATOLOGY ONCOLOGY PROGRESS NOTE  Date of service: 06/08/18  Patient Care Team: [name], MD as PCP - General (Family Medicine)  CC: f/u for multiple myeloma  Diagnosis:  IgG Kappa multiple myeloma   Current Treatment:  Revlimid maintenance   INTERVAL HISTORY:  [name] presents today for management and evaluation of his maintenance Revlimid treatment for his multiple myeloma. The patient's last visit with [name] was on 04/06/18. The pt reports that he is doing well overall.  The pt reports that he has had some right inguinal discomfort, which he feels when he lies on his right side, not when he lies on his back. He notes that this discomfort is worse in the morning when he wakes up, and then improves throughout the day. Denies this being tender to touch.  The pt is now on 16 units of insulin, in conversation with his PCP Dr. Doreene Nest. The pt notes that he has gained a couple pounds recently and has been eating plenty, but not all healthy foods.   The pt denies any problems tolerating his maintenance 96m Revlimid in the interim, denying diarrhea, new bone pains or skin rashes.   Lab results today (06/08/18) of CBC w/diff and CMP is as follows: all values are WNL except for WBC at 2.7k, RBC at 4.03, HGB at 11.1, HCT at 35.4, RDW at 17.3, PLT at 134k, ANC at 1.2k, Glucose at 149, BUN at 26, Creatinine at 1.74, Calcium at 8.7, Albumin at 3.2, AST at 14, GFR at 44. 06/08/18 MMP shows no M spike   On review of systems, pt reports right inguinal discomfort, eating well, mild weight gain, and denies inguinal tenderness to touch, diarrhea, skin rashes, mouth sores, fevers, chills, night sweats, new bone pains, abdominal pains, leg swelling, and any other symptoms.    REVIEW OF SYSTEMS:    A 10+ POINT REVIEW OF SYSTEMS WAS OBTAINED including neurology, dermatology, psychiatry, cardiac, respiratory, lymph, extremities, GI, GU, Musculoskeletal, constitutional, breasts, reproductive,  HEENT.  All pertinent positives are noted in the HPI.  All others are negative.    Past Medical History:  Diagnosis Date  . CAD (coronary artery disease) 2006   stent LAD 3.0x18 [place], occluded RCA  . Cancer ([place]    multiple myeloma  . Diabetes mellitus without complication ([place]   . H/O non-insulin dependent diabetes mellitus   . Hyperlipidemia   . Hypertension     . Past Surgical History:  Procedure Laterality Date  . CARDIAC CATHETERIZATION  2006  . CORONARY ANGIOPLASTY WITH STENT PLACEMENT  2007  . IR FLUORO GUIDE CV LINE RIGHT  05/20/2016  . IR [place] VASC ACCESS RIGHT  05/20/2016    . Social History   Tobacco Use  . Smoking status: Never Smoker  . Smokeless tobacco: Never Used  Substance Use Topics  . Alcohol use: No  . Drug use: No    ALLERGIES:  has No Known Allergies.  MEDICATIONS:  Current Outpatient Medications  Medication Sig Dispense Refill  . acyclovir (ZOVIRAX) 200 MG capsule Take 1 capsule by mouth twice daily 180 capsule 0  . amLODipine (NORVASC) 10 MG tablet Take 10 mg by mouth daily.    .Douglas Kitchenaspirin 81 MG tablet Take 81 mg by mouth 2 (two) times daily.     .Douglas Kitchenatorvastatin (LIPITOR) 20 MG tablet Take 1 tablet (20 mg total) by mouth daily. 90 tablet 3  . Blood Glucose Monitoring Suppl (ACCU-CHEK AVIVA PLUS) w/Device KIT by Does not  apply route.    . gabapentin (NEURONTIN) 300 MG capsule Take 900 mg by mouth 2 (two) times daily.     . Insulin Glargine (LANTUS SOLOSTAR) 100 UNIT/ML Solostar Pen Inject 20 Units into the skin daily. (Patient taking differently: Inject 12 Units into the skin daily. ) 15 mL 11  . lenalidomide (REVLIMID) 10 MG capsule Take 1 capsule (10 mg total) by mouth daily. Take for 21 days then 7 days off. Take with water. 21 capsule 0  . lisinopril (PRINIVIL,ZESTRIL) 2.5 MG tablet Take 2.5 mg by mouth daily.    . metoprolol tartrate (LOPRESSOR) 100 MG tablet Take 100 mg by mouth daily.     . nitroGLYCERIN (NITROSTAT) 0.4 MG SL tablet  Place 1 tablet (0.4 mg total) under the tongue every 5 (five) minutes x 3 doses as needed for chest pain. 30 tablet 12  . RELION PEN NEEDLES 32G X 4 MM MISC     . vitamin B-12 (CYANOCOBALAMIN) 1000 MCG tablet Take 1,000 mcg by mouth daily.     No current facility-administered medications for this visit.     PHYSICAL EXAMINATION: ECOG PERFORMANCE STATUS: 1 - Symptomatic but completely ambulatory  Vitals:   06/08/18 1419  BP: 128/75  Pulse: 62  Resp: 18  Temp: 98.3 F (36.8 C)  SpO2: 100%    Filed Weights   06/08/18 1419  Weight: 179 lb 6.4 oz (81.4 kg)   .Body mass index is 25.02 kg/m.  GENERAL:alert, in no acute distress and comfortable SKIN: no acute rashes, no significant lesions EYES: conjunctiva are pink and non-injected, sclera anicteric OROPHARYNX: MMM, no exudates, no oropharyngeal erythema or ulceration NECK: supple, no JVD LYMPH: Unchanged right cervical LN, no palpable lymphadenopathy in the axillary or inguinal regions LUNGS: clear to auscultation b/l with normal respiratory effort HEART: regular rate & rhythm ABDOMEN:  normoactive bowel sounds , non tender, not distended. No palpable hepatosplenomegaly.  Extremity: no pedal edema PSYCH: alert & oriented x 3 with fluent speech NEURO: no focal motor/sensory deficits   LABORATORY DATA:   I have reviewed the data as listed  . CBC Latest Ref Rng & Units 06/08/2018 04/06/2018 02/03/2018  WBC 4.0 - 10.5 K/uL 2.7(L) 2.8(L) 2.7(L)  Hemoglobin 13.0 - 17.0 g/dL 11.1(L) 11.5(L) 11.0(L)  Hematocrit 39.0 - 52.0 % 35.4(L) 37.1(L) 33.7(L)  Platelets 150 - 400 K/uL 134(L) 164 176  ANC 1k  . CMP Latest Ref Rng & Units 06/08/2018 04/06/2018 03/05/2018  Glucose 70 - 99 mg/dL 149(H) 169(H) 164(H)  BUN 8 - 23 mg/dL 26(H) 19 18  Creatinine 0.61 - 1.24 mg/dL 1.74(H) 1.83(H) 1.58(H)  Sodium 135 - 145 mmol/L 139 140 140  Potassium 3.5 - 5.1 mmol/L 4.1 3.8 3.7  Chloride 98 - 111 mmol/L 106 105 105  CO2 22 - 32 mmol/L _0 Calcium 8.9 - 10.3 mg/dL 8.7(L) 8.6(L) 9.0  Total Protein 6.5 - 8.1 g/dL 7.1 7.4 -  Total Bilirubin 0.3 - 1.2 mg/dL 0.4 0.4 -  Alkaline Phos 38 - 126 U/L 85 94 -  AST 15 - 41 U/L 14(L) 16 -  ALT 0 - 44 U/L 16 20 -       RADIOGRAPHIC STUDIES: I have personally reviewed the radiological images as listed and agreed with the findings in the report. No results found.  ASSESSMENT & PLAN:   73 y.o.  with multiple medical comorbidities but fairly good performance status overall with   1) ISS Stage II IgG Kapppa multiple myeloma.  Anemia + Renal insufficiency.  Bone survey neg for concerning bone lesions. significantly elevated kappafree light chains 858 mg percent, lambda 18.4, with a ratio of 46.65 . He was also noted to have an M spike of 1.7 g/dL with IFE showing IgG kappa monoclonal protein. Quantitative IgG level was increased to nearly 2500 mg/dL. 24-hour UPEP showed monoclonal protein of 1300 mg per 24 hours which constituted about 86% of all the urinary protein. Bone marrow biopsy showed 20-30% Restricted plasma cells consistent with multiple myeloma Cytogenetics/FISH  Showed +4, +11, and +17   Patient has been treated with CyBord X 2 cycles . Treatment interrupted due to severe c diff colitis with prolonged hospitalization and decline in performance status. Patient is much improved and back to baseline now. M protein undetected with neg IFE Has been treated with 5 cycles of Rd  Currently on maintenance Rituxan and in Remission.  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 Had an element of obstructive uropathy due to BPH which has resolved. Creatinine continues to improve from 3.5 to 2.9 to 1.9 to 1.6. Stable 1.6-1.8  3) Normocytic anemia likely related to multiple myeloma.-stable, improved. hgb 11.5 No abdominal pain or discomfort over the right upper quadrant. No fevers or chills.  PLAN:  -Discussed pt labwork today, 06/08/18;  blood counts and chemistries are stable. Will continue to watch stable mild leukopenia, no concerns for infections, and would consider dose reducing Revlimid to 29m if necessary. He is soon going to have a week of Revlimid which should help his WBC counts. -06/08/18 MMP shows no M spike. Last available MMP from 04/06/18 did not observe an M spike -The pt shows no clinical or lab progression of his multiple myeloma at this time.  -The pt has no prohibitive toxicities from continuing maintenance 171mRevlimid, three weeks on and one week off, at this time. -Suspect that patient's right inguinal discomfort is muscular and recommend light stretching exercises.  -Continue 8125mspirin   -Continue follow-up with nephrology- patient is following with Dr MatIla McgillContinue age-appropriate cancer screening with PCP -Will see the pt back in 8 weeks  4) 5) DM2 - controled -continue f/u with PCP.   RTC with Dr KalIrene Limboth labs in 2 months   The total time spent in the appt was 20 minutes and more than 50% was on counseling and direct patient cares.    GauSullivan Lone MS BridgeportHIVMS SCHSelect Specialty Hospital MckeesportHCentral Texas Medical Centermatology/Oncology Physician ConChildren'S National Medical CenterOffice):       [phone] cell):  [phone]):           [phone], SchBaldwin Jamaicam acting as a scribe for Dr. GauSullivan Lone .I have reviewed the above documentation for accuracy and completeness, and I agree with the above. .GaBrunetta Genera

## 2018-06-08 ENCOUNTER — Telehealth: Payer: Self-pay | Admitting: Hematology

## 2018-06-08 ENCOUNTER — Inpatient Hospital Stay (HOSPITAL_BASED_OUTPATIENT_CLINIC_OR_DEPARTMENT_OTHER): Payer: Medicare HMO | Admitting: Hematology

## 2018-06-08 ENCOUNTER — Inpatient Hospital Stay: Payer: Medicare HMO | Attending: Hematology

## 2018-06-08 ENCOUNTER — Other Ambulatory Visit: Payer: Self-pay

## 2018-06-08 VITALS — BP 128/75 | HR 62 | Temp 98.3°F | Resp 18 | Ht 71.0 in | Wt 179.4 lb

## 2018-06-08 DIAGNOSIS — N289 Disorder of kidney and ureter, unspecified: Secondary | ICD-10-CM | POA: Diagnosis not present

## 2018-06-08 DIAGNOSIS — D638 Anemia in other chronic diseases classified elsewhere: Secondary | ICD-10-CM | POA: Diagnosis not present

## 2018-06-08 DIAGNOSIS — C9000 Multiple myeloma not having achieved remission: Secondary | ICD-10-CM | POA: Diagnosis present

## 2018-06-08 DIAGNOSIS — M899 Disorder of bone, unspecified: Secondary | ICD-10-CM

## 2018-06-08 DIAGNOSIS — R635 Abnormal weight gain: Secondary | ICD-10-CM | POA: Diagnosis not present

## 2018-06-08 DIAGNOSIS — I2510 Atherosclerotic heart disease of native coronary artery without angina pectoris: Secondary | ICD-10-CM | POA: Diagnosis not present

## 2018-06-08 DIAGNOSIS — C9001 Multiple myeloma in remission: Secondary | ICD-10-CM

## 2018-06-08 DIAGNOSIS — E119 Type 2 diabetes mellitus without complications: Secondary | ICD-10-CM

## 2018-06-08 DIAGNOSIS — A0472 Enterocolitis due to Clostridium difficile, not specified as recurrent: Secondary | ICD-10-CM | POA: Insufficient documentation

## 2018-06-08 DIAGNOSIS — R1030 Lower abdominal pain, unspecified: Secondary | ICD-10-CM | POA: Diagnosis not present

## 2018-06-08 DIAGNOSIS — D702 Other drug-induced agranulocytosis: Secondary | ICD-10-CM

## 2018-06-08 LAB — CBC WITH DIFFERENTIAL/PLATELET
Abs Immature Granulocytes: 0.02 10*3/uL (ref 0.00–0.07)
Basophils Absolute: 0 10*3/uL (ref 0.0–0.1)
Basophils Relative: 0 %
Eosinophils Absolute: 0.2 10*3/uL (ref 0.0–0.5)
Eosinophils Relative: 6 %
HCT: 35.4 % — ABNORMAL LOW (ref 39.0–52.0)
Hemoglobin: 11.1 g/dL — ABNORMAL LOW (ref 13.0–17.0)
Immature Granulocytes: 1 %
Lymphocytes Relative: 34 %
Lymphs Abs: 0.9 10*3/uL (ref 0.7–4.0)
MCH: 27.5 pg (ref 26.0–34.0)
MCHC: 31.4 g/dL (ref 30.0–36.0)
MCV: 87.8 fL (ref 80.0–100.0)
Monocytes Absolute: 0.4 10*3/uL (ref 0.1–1.0)
Monocytes Relative: 15 %
Neutro Abs: 1.2 10*3/uL — ABNORMAL LOW (ref 1.7–7.7)
Neutrophils Relative %: 44 %
Platelets: 134 10*3/uL — ABNORMAL LOW (ref 150–400)
RBC: 4.03 MIL/uL — ABNORMAL LOW (ref 4.22–5.81)
RDW: 17.3 % — ABNORMAL HIGH (ref 11.5–15.5)
WBC: 2.7 10*3/uL — ABNORMAL LOW (ref 4.0–10.5)
nRBC: 0 % (ref 0.0–0.2)

## 2018-06-08 LAB — CMP (CANCER CENTER ONLY)
ALT: 16 U/L (ref 0–44)
AST: 14 U/L — ABNORMAL LOW (ref 15–41)
Albumin: 3.2 g/dL — ABNORMAL LOW (ref 3.5–5.0)
Alkaline Phosphatase: 85 U/L (ref 38–126)
Anion gap: 6 (ref 5–15)
BUN: 26 mg/dL — ABNORMAL HIGH (ref 8–23)
CO2: 27 mmol/L (ref 22–32)
Calcium: 8.7 mg/dL — ABNORMAL LOW (ref 8.9–10.3)
Chloride: 106 mmol/L (ref 98–111)
Creatinine: 1.74 mg/dL — ABNORMAL HIGH (ref 0.61–1.24)
GFR, Est AFR Am: 44 mL/min — ABNORMAL LOW (ref >60)
GFR, Estimated: 38 mL/min — ABNORMAL LOW (ref >60)
Glucose, Bld: 149 mg/dL — ABNORMAL HIGH (ref 70–99)
Potassium: 4.1 mmol/L (ref 3.5–5.1)
Sodium: 139 mmol/L (ref 135–145)
Total Bilirubin: 0.4 mg/dL (ref 0.3–1.2)
Total Protein: 7.1 g/dL (ref 6.5–8.1)

## 2018-06-08 NOTE — Telephone Encounter (Signed)
Scheduled appt per 6/2 los.  A calendar will be mailed out 

## 2018-06-09 LAB — MULTIPLE MYELOMA PANEL, SERUM
Albumin SerPl Elph-Mcnc: 3.2 g/dL (ref 2.9–4.4)
Albumin/Glob SerPl: 1 (ref 0.7–1.7)
Alpha 1: 0.2 g/dL (ref 0.0–0.4)
Alpha2 Glob SerPl Elph-Mcnc: 0.8 g/dL (ref 0.4–1.0)
B-Globulin SerPl Elph-Mcnc: 0.9 g/dL (ref 0.7–1.3)
Gamma Glob SerPl Elph-Mcnc: 1.5 g/dL (ref 0.4–1.8)
Globulin, Total: 3.3 g/dL (ref 2.2–3.9)
IgA: 303 mg/dL (ref 61–437)
IgG (Immunoglobin G), Serum: 1556 mg/dL (ref 603–1613)
IgM (Immunoglobulin M), Srm: 36 mg/dL (ref 15–143)
Total Protein ELP: 6.5 g/dL (ref 6.0–8.5)

## 2018-06-24 ENCOUNTER — Other Ambulatory Visit: Payer: Self-pay | Admitting: *Deleted

## 2018-06-24 DIAGNOSIS — C9000 Multiple myeloma not having achieved remission: Secondary | ICD-10-CM

## 2018-06-24 MED ORDER — LENALIDOMIDE 10 MG PO CAPS: 10.0000 mg | ORAL_CAPSULE | Freq: Every day | ORAL | 0 refills | Status: DC

## 2018-06-24 NOTE — Telephone Encounter (Signed)
Refill Revlimid per Dr.Kale OV note 06/08/2018. Refill sent to [place], [place]. [address] [place] Auth# [id number], 06/24/2018

## 2018-07-28 ENCOUNTER — Other Ambulatory Visit: Payer: Self-pay | Admitting: *Deleted

## 2018-07-28 DIAGNOSIS — C9000 Multiple myeloma not having achieved remission: Secondary | ICD-10-CM

## 2018-07-28 MED ORDER — LENALIDOMIDE 10 MG PO CAPS: 10.0000 mg | ORAL_CAPSULE | Freq: Every day | ORAL | 0 refills | Status: DC

## 2018-07-28 NOTE — Telephone Encounter (Signed)
Refill Revlimid per Dr.Kale OV note 06/08/2018 Refill sent to [place], [place]. [address] [place] Auth# [id number], 07/28/2018

## 2018-08-08 NOTE — Progress Notes (Signed)
Douglas Rose  HEMATOLOGY ONCOLOGY PROGRESS NOTE  Date of service: 08/09/18  Patient Care Team: [name], MD as PCP - General (Family Medicine)  CC: f/u for multiple myeloma  Diagnosis:  IgG Kappa multiple myeloma   Current Treatment:  Revlimid maintenance   INTERVAL HISTORY:  Mr. Harriott presents today for management and evaluation of his maintenance Revlimid treatment for his multiple myeloma. The patient's last visit with [name] was on 06/08/2018. The pt reports that he is doing well overall.  The pt reports occasional pain in his upper right thigh that has been present since his last visit. Some mornings, he cannot get out of bed and needs to hold onto something to prevent from falling. Denies back pain, nausea, and SOB. This morning, he bent over and felt a sharp pain in his chest that only lasted for a second.  He has no problems with Revlimed and he is currently in his week off. Denies diarrhea. He has been walking 2-3x/wk. He notes some night sweats that have improved over the past few months. He is taking his aspirin.  Lab results today (08/09/2018) of CBC w/diff and CMP is as follows: all values are WNL except for WBC at 2.6k, RBC at 4.01, HGB at 11.2, HCT at 35.6, RDW at 18.3, neutro abs at 1.2, glucose bld at 167, Creatinine at 1.66, calcium at 8.7, Albumin at 3.2, AST at 13, GFR at 46.  On review of systems, pt reports upper right thigh pain, episode of chest pain, night sweats and denies SOB, diarrhea, back pain, nausea, concerns of infections, and any other symptoms.   REVIEW OF SYSTEMS:    A 10+ POINT REVIEW OF SYSTEMS WAS OBTAINED including neurology, dermatology, psychiatry, cardiac, respiratory, lymph, extremities, GI, GU, Musculoskeletal, constitutional, breasts, reproductive, HEENT.  All pertinent positives are noted in the HPI.  All others are negative.   Past Medical History:  Diagnosis Date  . CAD (coronary artery disease) 2006   stent LAD 3.0x18 [place], occluded  RCA  . Cancer ([place])    multiple myeloma  . Diabetes mellitus without complication ([place])   . H/O non-insulin dependent diabetes mellitus   . Hyperlipidemia   . Hypertension     . Past Surgical History:  Procedure Laterality Date  . CARDIAC CATHETERIZATION  2006  . CORONARY ANGIOPLASTY WITH STENT PLACEMENT  2007  . IR FLUORO GUIDE CV LINE RIGHT  05/20/2016  . IR US GUIDE VASC ACCESS RIGHT  05/20/2016    . Social History   Tobacco Use  . Smoking status: Never Smoker  . Smokeless tobacco: Never Used  Substance Use Topics  . Alcohol use: No  . Drug use: No    ALLERGIES:  has No Known Allergies.  MEDICATIONS:  Current Outpatient Medications  Medication Sig Dispense Refill  . acyclovir (ZOVIRAX) 200 MG capsule Take 1 capsule by mouth twice daily 180 capsule 0  . amLODipine (NORVASC) 10 MG tablet Take 10 mg by mouth daily.    Douglas Rose aspirin 81 MG tablet Take 81 mg by mouth 2 (two) times daily.     Douglas Rose atorvastatin (LIPITOR) 20 MG tablet Take 1 tablet (20 mg total) by mouth daily. 90 tablet 3  . Blood Glucose Monitoring Suppl (ACCU-CHEK AVIVA PLUS) w/Device KIT by Does not apply route.    . gabapentin (NEURONTIN) 300 MG capsule Take 900 mg by mouth 2 (two) times daily.     . Insulin Glargine (LANTUS SOLOSTAR) 100 UNIT/ML Solostar Pen Inject 20 Units into the  skin daily. (Patient taking differently: Inject 12 Units into the skin daily. ) 15 mL 11  . lenalidomide (REVLIMID) 10 MG capsule Take 1 capsule (10 mg total) by mouth daily. Take for 21 days then 7 days off. Take with water. 21 capsule 0  . lisinopril (PRINIVIL,ZESTRIL) 2.5 MG tablet Take 2.5 mg by mouth daily.    . metoprolol tartrate (LOPRESSOR) 100 MG tablet Take 100 mg by mouth daily.     . nitroGLYCERIN (NITROSTAT) 0.4 MG SL tablet Place 1 tablet (0.4 mg total) under the tongue every 5 (five) minutes x 3 doses as needed for chest pain. 30 tablet 12  . RELION PEN NEEDLES 32G X 4 MM MISC     . vitamin B-12 (CYANOCOBALAMIN) 1000  MCG tablet Take 1,000 mcg by mouth daily.     No current facility-administered medications for this visit.     PHYSICAL EXAMINATION: ECOG PERFORMANCE STATUS: 1 - Symptomatic but completely ambulatory  Vitals:   08/09/18 1438  BP: 135/74  Pulse: 60  Resp: 18  Temp: 98.3 F (36.8 C)  SpO2: 100%    Filed Weights   08/09/18 1438  Weight: 182 lb 12.8 oz (82.9 kg)   .Body mass index is 25.5 kg/m.   GENERAL:alert, in no acute distress and comfortable SKIN: no acute rashes, no significant lesions EYES: conjunctiva are pink and non-injected, sclera anicteric OROPHARYNX: MMM, no exudates, no oropharyngeal erythema or ulceration NECK: supple, no JVD LYMPH: unchanged right cervical LN, No palpable lymphadenopathy in the axillary or inguinal regions LUNGS: clear to auscultation b/l with normal respiratory effort HEART: regular rate & rhythm ABDOMEN:  normoactive bowel sounds , non tender, not distended. No palpable hepatosplenomegaly.  Extremity: no pedal edema PSYCH: alert & oriented x 3 with fluent speech NEURO: no focal motor/sensory deficits   LABORATORY DATA:   I have reviewed the data as listed  06/08/2018 MMP Component     Latest Ref Rng & Units 06/08/2018  IgG (Immunoglobin G), Serum     603 - 1,613 mg/dL 1,556  IgA     61 - 437 mg/dL 303  IgM (Immunoglobulin M), Srm     15 - 143 mg/dL 36  Total Protein ELP     6.0 - 8.5 g/dL 6.5  Albumin SerPl Elph-Mcnc     2.9 - 4.4 g/dL 3.2  Alpha 1     0.0 - 0.4 g/dL 0.2  Alpha2 Glob SerPl Elph-Mcnc     0.4 - 1.0 g/dL 0.8  B-Globulin SerPl Elph-Mcnc     0.7 - 1.3 g/dL 0.9  Gamma Glob SerPl Elph-Mcnc     0.4 - 1.8 g/dL 1.5  M Protein SerPl Elph-Mcnc     Not Observed g/dL Not Observed  Globulin, Total     2.2 - 3.9 g/dL 3.3  Albumin/Glob SerPl     0.7 - 1.7 1.0  IFE 1      Comment  Please Note (HCV):      Comment   . CBC Latest Ref Rng & Units 08/09/2018 06/08/2018 04/06/2018  WBC 4.0 - 10.5 K/uL 2.6(L) 2.7(L) 2.8(L)   Hemoglobin 13.0 - 17.0 g/dL 11.2(L) 11.1(L) 11.5(L)  Hematocrit 39.0 - 52.0 % 35.6(L) 35.4(L) 37.1(L)  Platelets 150 - 400 K/uL 174 134(L) 164  ANC 1k  . CMP Latest Ref Rng & Units 08/09/2018 06/08/2018 04/06/2018  Glucose 70 - 99 mg/dL 167(H) 149(H) 169(H)  BUN 8 - 23 mg/dL 20 26(H) 19  Creatinine 0.61 - 1.24 mg/dL 1.66(H) 1.74(H)  1.83(H)  Sodium 135 - 145 mmol/L 138 139 140  Potassium 3.5 - 5.1 mmol/L 3.7 4.1 3.8  Chloride 98 - 111 mmol/L 105 106 105  CO2 22 - 32 mmol/L '26 27 26  '$ Calcium 8.9 - 10.3 mg/dL 8.7(L) 8.7(L) 8.6(L)  Total Protein 6.5 - 8.1 g/dL 7.1 7.1 7.4  Total Bilirubin 0.3 - 1.2 mg/dL 0.4 0.4 0.4  Alkaline Phos 38 - 126 U/L 91 85 94  AST 15 - 41 U/L 13(L) 14(L) 16  ALT 0 - 44 U/L '15 16 20       '$ RADIOGRAPHIC STUDIES: I have personally reviewed the radiological images as listed and agreed with the findings in the report. No results found.  ASSESSMENT & PLAN:   73 y.o.  with multiple medical comorbidities but fairly good performance status overall with   1) ISS Stage II IgG Kapppa multiple myeloma. Anemia + Renal insufficiency.  Bone survey neg for concerning bone lesions. significantly elevated kappafree light chains 858 mg percent, lambda 18.4, with a ratio of 46.65 . He was also noted to have an M spike of 1.7 g/dL with IFE showing IgG kappa monoclonal protein. Quantitative IgG level was increased to nearly 2500 mg/dL. 24-hour UPEP showed monoclonal protein of 1300 mg per 24 hours which constituted about 86% of all the urinary protein. Bone marrow biopsy showed 20-30% Restricted plasma cells consistent with multiple myeloma Cytogenetics/FISH  Showed +4, +11, and +17   Patient has been treated with CyBord X 2 cycles . Treatment interrupted due to severe c diff colitis with prolonged hospitalization and decline in performance status. Patient is much improved and back to baseline now. M protein undetected with neg IFE Has been treated with 5 cycles of Rd   Currently on maintenance Rituxan and in Remission.  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 Had an element of obstructive uropathy due to BPH which has resolved. Creatinine continues to be stable.  3) Normocytic anemia likely related to multiple myeloma.-stable, improved. hgb 11.5 No abdominal pain or discomfort over the right upper quadrant. No fevers or chills.  PLAN:  -Discussed pt labwork today, 08/09/2018; white counts tend to run low because of Revlimed, blood chemistries are stable, MMP shows no M spike. -The pt shows no clinical or lab progression of his multiple myeloma at this time.  -The pt has no prohibitive toxicities from continuing maintenance '10mg'$  Revlimid, three weeks on and one week off, at this time. -Continue '81mg'$  aspirin   -Discussed that Revlimid can cause intermittent muscle cramping. Recommend using warm compresses on leg and staying physically active. -Schedule Xray of right femur and [name] of right LE to rule out blood clots in the leg -Will see the pt back in 2 months  4) 5) DM2 - controled -continue f/u with PCP.   X Ray rt femur in 3 days US venous RLE in 3 days RTC with Dr Irene Limbo with labs in 2 months   The total time spent in the appt was 20 minutes and more than 50% was on counseling and direct patient cares.   Sullivan Lone MD [place] AAHIVMS [facility] Hematology/Oncology Physician [facility]  (Office):       [phone] (Work cell):  [phone] (Fax):           [phone]  I, De Burrs, am acting as a scribe for [name]  .I have reviewed the above documentation for accuracy and completeness, and I agree with the  above. .Brunetta Genera MD

## 2018-08-09 ENCOUNTER — Other Ambulatory Visit: Payer: Self-pay

## 2018-08-09 ENCOUNTER — Inpatient Hospital Stay (HOSPITAL_BASED_OUTPATIENT_CLINIC_OR_DEPARTMENT_OTHER): Payer: Medicare HMO | Admitting: Hematology

## 2018-08-09 ENCOUNTER — Inpatient Hospital Stay: Payer: Medicare HMO | Attending: Hematology

## 2018-08-09 VITALS — BP 135/74 | HR 60 | Temp 98.3°F | Resp 18 | Ht 71.0 in | Wt 182.8 lb

## 2018-08-09 DIAGNOSIS — C9001 Multiple myeloma in remission: Secondary | ICD-10-CM | POA: Diagnosis not present

## 2018-08-09 DIAGNOSIS — E785 Hyperlipidemia, unspecified: Secondary | ICD-10-CM | POA: Diagnosis not present

## 2018-08-09 DIAGNOSIS — I2510 Atherosclerotic heart disease of native coronary artery without angina pectoris: Secondary | ICD-10-CM | POA: Insufficient documentation

## 2018-08-09 DIAGNOSIS — E119 Type 2 diabetes mellitus without complications: Secondary | ICD-10-CM | POA: Diagnosis not present

## 2018-08-09 DIAGNOSIS — N189 Chronic kidney disease, unspecified: Secondary | ICD-10-CM | POA: Diagnosis not present

## 2018-08-09 DIAGNOSIS — D631 Anemia in chronic kidney disease: Secondary | ICD-10-CM | POA: Diagnosis not present

## 2018-08-09 DIAGNOSIS — M79651 Pain in right thigh: Secondary | ICD-10-CM | POA: Insufficient documentation

## 2018-08-09 DIAGNOSIS — Z79899 Other long term (current) drug therapy: Secondary | ICD-10-CM | POA: Insufficient documentation

## 2018-08-09 DIAGNOSIS — C9000 Multiple myeloma not having achieved remission: Secondary | ICD-10-CM | POA: Insufficient documentation

## 2018-08-09 DIAGNOSIS — R61 Generalized hyperhidrosis: Secondary | ICD-10-CM | POA: Diagnosis not present

## 2018-08-09 DIAGNOSIS — D702 Other drug-induced agranulocytosis: Secondary | ICD-10-CM

## 2018-08-09 LAB — CBC WITH DIFFERENTIAL/PLATELET
Abs Immature Granulocytes: 0.01 10*3/uL (ref 0.00–0.07)
Basophils Absolute: 0 10*3/uL (ref 0.0–0.1)
Basophils Relative: 0 %
Eosinophils Absolute: 0.3 10*3/uL (ref 0.0–0.5)
Eosinophils Relative: 11 %
HCT: 35.6 % — ABNORMAL LOW (ref 39.0–52.0)
Hemoglobin: 11.2 g/dL — ABNORMAL LOW (ref 13.0–17.0)
Immature Granulocytes: 0 %
Lymphocytes Relative: 27 %
Lymphs Abs: 0.7 10*3/uL (ref 0.7–4.0)
MCH: 27.9 pg (ref 26.0–34.0)
MCHC: 31.5 g/dL (ref 30.0–36.0)
MCV: 88.8 fL (ref 80.0–100.0)
Monocytes Absolute: 0.4 10*3/uL (ref 0.1–1.0)
Monocytes Relative: 16 %
Neutro Abs: 1.2 10*3/uL — ABNORMAL LOW (ref 1.7–7.7)
Neutrophils Relative %: 46 %
Platelets: 174 10*3/uL (ref 150–400)
RBC: 4.01 MIL/uL — ABNORMAL LOW (ref 4.22–5.81)
RDW: 18.3 % — ABNORMAL HIGH (ref 11.5–15.5)
WBC: 2.6 10*3/uL — ABNORMAL LOW (ref 4.0–10.5)
nRBC: 0 % (ref 0.0–0.2)

## 2018-08-09 LAB — CMP (CANCER CENTER ONLY)
ALT: 15 U/L (ref 0–44)
AST: 13 U/L — ABNORMAL LOW (ref 15–41)
Albumin: 3.2 g/dL — ABNORMAL LOW (ref 3.5–5.0)
Alkaline Phosphatase: 91 U/L (ref 38–126)
Anion gap: 7 (ref 5–15)
BUN: 20 mg/dL (ref 8–23)
CO2: 26 mmol/L (ref 22–32)
Calcium: 8.7 mg/dL — ABNORMAL LOW (ref 8.9–10.3)
Chloride: 105 mmol/L (ref 98–111)
Creatinine: 1.66 mg/dL — ABNORMAL HIGH (ref 0.61–1.24)
GFR, Est AFR Am: 46 mL/min — ABNORMAL LOW (ref >60)
GFR, Estimated: 40 mL/min — ABNORMAL LOW (ref >60)
Glucose, Bld: 167 mg/dL — ABNORMAL HIGH (ref 70–99)
Potassium: 3.7 mmol/L (ref 3.5–5.1)
Sodium: 138 mmol/L (ref 135–145)
Total Bilirubin: 0.4 mg/dL (ref 0.3–1.2)
Total Protein: 7.1 g/dL (ref 6.5–8.1)

## 2018-08-10 ENCOUNTER — Telehealth: Payer: Self-pay | Admitting: Hematology

## 2018-08-10 LAB — MULTIPLE MYELOMA PANEL, SERUM
Albumin SerPl Elph-Mcnc: 3.3 g/dL (ref 2.9–4.4)
Albumin/Glob SerPl: 1.1 (ref 0.7–1.7)
Alpha 1: 0.2 g/dL (ref 0.0–0.4)
Alpha2 Glob SerPl Elph-Mcnc: 0.8 g/dL (ref 0.4–1.0)
B-Globulin SerPl Elph-Mcnc: 0.8 g/dL (ref 0.7–1.3)
Gamma Glob SerPl Elph-Mcnc: 1.5 g/dL (ref 0.4–1.8)
Globulin, Total: 3.2 g/dL (ref 2.2–3.9)
IgA: 312 mg/dL (ref 61–437)
IgG (Immunoglobin G), Serum: 1607 mg/dL (ref 603–1613)
IgM (Immunoglobulin M), Srm: 34 mg/dL (ref 15–143)
Total Protein ELP: 6.5 g/dL (ref 6.0–8.5)

## 2018-08-10 NOTE — Telephone Encounter (Signed)
Scheduled appt per 8/3 los.  Spoke with patient and he is aware of his appt date and time.

## 2018-08-12 ENCOUNTER — Ambulatory Visit (HOSPITAL_COMMUNITY)
Admission: RE | Admit: 2018-08-12 | Discharge: 2018-08-12 | Disposition: A | Discharge status: Home / Self Care | Payer: Medicare HMO | Source: Ambulatory Visit | Attending: Hematology | Admitting: Hematology

## 2018-08-12 ENCOUNTER — Other Ambulatory Visit: Payer: Self-pay

## 2018-08-12 ENCOUNTER — Telehealth: Payer: Self-pay | Admitting: *Deleted

## 2018-08-12 DIAGNOSIS — C9001 Multiple myeloma in remission: Secondary | ICD-10-CM

## 2018-08-12 NOTE — Progress Notes (Addendum)
Right lower extremity venous duplex completed. Preliminary results in Chart review CV Proc. [facility], RVS 08/12/2018,11:31 AM

## 2018-08-12 NOTE — Telephone Encounter (Signed)
Results called from [place] in Vascular Lab - right lower venous study negative for DVT. [name] notified.

## 2018-08-31 ENCOUNTER — Other Ambulatory Visit: Payer: Self-pay | Admitting: *Deleted

## 2018-08-31 DIAGNOSIS — C9000 Multiple myeloma not having achieved remission: Secondary | ICD-10-CM

## 2018-08-31 MED ORDER — LENALIDOMIDE 10 MG PO CAPS: 10.0000 mg | ORAL_CAPSULE | Freq: Every day | ORAL | 0 refills | Status: DC

## 2018-08-31 NOTE — Telephone Encounter (Signed)
Received faxed refill request for Revlimid 10 mg caps from [facility]  Refilled per  [name] OV note 08/09/2018 Refill sent to [place], [address] [place] # [id number], 08/31/2018

## 2018-09-27 ENCOUNTER — Other Ambulatory Visit: Payer: Self-pay | Admitting: Hematology

## 2018-09-27 DIAGNOSIS — C9000 Multiple myeloma not having achieved remission: Secondary | ICD-10-CM

## 2018-10-01 ENCOUNTER — Other Ambulatory Visit: Payer: Self-pay | Admitting: *Deleted

## 2018-10-01 DIAGNOSIS — C9000 Multiple myeloma not having achieved remission: Secondary | ICD-10-CM

## 2018-10-01 MED ORDER — LENALIDOMIDE 10 MG PO CAPS: 10.0000 mg | ORAL_CAPSULE | Freq: Every day | ORAL | 0 refills | Status: DC

## 2018-10-01 NOTE — Telephone Encounter (Signed)
Received faxed refill request from [name] for Revlimid 10 mg Refill sent to [place], [address]. [name] by escribe per Dr.Kale's OV note 08/09/2018 [name] SX:[id number], 10/01/2018

## 2018-10-11 ENCOUNTER — Other Ambulatory Visit: Payer: Self-pay | Admitting: *Deleted

## 2018-10-11 DIAGNOSIS — C9001 Multiple myeloma in remission: Secondary | ICD-10-CM

## 2018-10-12 ENCOUNTER — Ambulatory Visit (HOSPITAL_COMMUNITY)
Admission: RE | Admit: 2018-10-12 | Discharge: 2018-10-12 | Disposition: A | Discharge status: Home / Self Care | Payer: Medicare HMO | Source: Ambulatory Visit | Attending: Hematology | Admitting: Hematology

## 2018-10-12 ENCOUNTER — Inpatient Hospital Stay: Payer: Medicare HMO | Attending: Hematology

## 2018-10-12 ENCOUNTER — Other Ambulatory Visit: Payer: Self-pay

## 2018-10-12 ENCOUNTER — Inpatient Hospital Stay (HOSPITAL_BASED_OUTPATIENT_CLINIC_OR_DEPARTMENT_OTHER): Payer: Medicare HMO | Admitting: Hematology

## 2018-10-12 VITALS — BP 143/79 | HR 67 | Temp 98.5°F | Resp 18 | Ht 71.0 in | Wt 180.7 lb

## 2018-10-12 DIAGNOSIS — D649 Anemia, unspecified: Secondary | ICD-10-CM | POA: Diagnosis not present

## 2018-10-12 DIAGNOSIS — Z7982 Long term (current) use of aspirin: Secondary | ICD-10-CM | POA: Insufficient documentation

## 2018-10-12 DIAGNOSIS — M79651 Pain in right thigh: Secondary | ICD-10-CM | POA: Insufficient documentation

## 2018-10-12 DIAGNOSIS — Z79899 Other long term (current) drug therapy: Secondary | ICD-10-CM | POA: Insufficient documentation

## 2018-10-12 DIAGNOSIS — N289 Disorder of kidney and ureter, unspecified: Secondary | ICD-10-CM | POA: Diagnosis not present

## 2018-10-12 DIAGNOSIS — C9000 Multiple myeloma not having achieved remission: Secondary | ICD-10-CM | POA: Insufficient documentation

## 2018-10-12 DIAGNOSIS — C9001 Multiple myeloma in remission: Secondary | ICD-10-CM

## 2018-10-12 DIAGNOSIS — E119 Type 2 diabetes mellitus without complications: Secondary | ICD-10-CM | POA: Diagnosis not present

## 2018-10-12 DIAGNOSIS — R0602 Shortness of breath: Secondary | ICD-10-CM | POA: Insufficient documentation

## 2018-10-12 DIAGNOSIS — Z794 Long term (current) use of insulin: Secondary | ICD-10-CM | POA: Diagnosis not present

## 2018-10-12 DIAGNOSIS — Z955 Presence of coronary angioplasty implant and graft: Secondary | ICD-10-CM | POA: Insufficient documentation

## 2018-10-12 DIAGNOSIS — E785 Hyperlipidemia, unspecified: Secondary | ICD-10-CM | POA: Diagnosis not present

## 2018-10-12 DIAGNOSIS — Z85820 Personal history of malignant melanoma of skin: Secondary | ICD-10-CM | POA: Diagnosis not present

## 2018-10-12 DIAGNOSIS — I10 Essential (primary) hypertension: Secondary | ICD-10-CM | POA: Diagnosis not present

## 2018-10-12 LAB — CMP (CANCER CENTER ONLY)
ALT: 14 U/L (ref 0–44)
AST: 13 U/L — ABNORMAL LOW (ref 15–41)
Albumin: 3.4 g/dL — ABNORMAL LOW (ref 3.5–5.0)
Alkaline Phosphatase: 100 U/L (ref 38–126)
Anion gap: 8 (ref 5–15)
BUN: 20 mg/dL (ref 8–23)
CO2: 24 mmol/L (ref 22–32)
Calcium: 9 mg/dL (ref 8.9–10.3)
Chloride: 106 mmol/L (ref 98–111)
Creatinine: 1.57 mg/dL — ABNORMAL HIGH (ref 0.61–1.24)
GFR, Est AFR Am: 50 mL/min — ABNORMAL LOW (ref >60)
GFR, Estimated: 43 mL/min — ABNORMAL LOW (ref >60)
Glucose, Bld: 162 mg/dL — ABNORMAL HIGH (ref 70–99)
Potassium: 4.1 mmol/L (ref 3.5–5.1)
Sodium: 138 mmol/L (ref 135–145)
Total Bilirubin: 0.3 mg/dL (ref 0.3–1.2)
Total Protein: 7.3 g/dL (ref 6.5–8.1)

## 2018-10-12 LAB — CBC WITH DIFFERENTIAL (CANCER CENTER ONLY)
Abs Immature Granulocytes: 0.01 10*3/uL (ref 0.00–0.07)
Basophils Absolute: 0 10*3/uL (ref 0.0–0.1)
Basophils Relative: 0 %
Eosinophils Absolute: 0.2 10*3/uL (ref 0.0–0.5)
Eosinophils Relative: 5 %
HCT: 36.1 % — ABNORMAL LOW (ref 39.0–52.0)
Hemoglobin: 11.5 g/dL — ABNORMAL LOW (ref 13.0–17.0)
Immature Granulocytes: 0 %
Lymphocytes Relative: 25 %
Lymphs Abs: 0.7 10*3/uL (ref 0.7–4.0)
MCH: 28 pg (ref 26.0–34.0)
MCHC: 31.9 g/dL (ref 30.0–36.0)
MCV: 87.8 fL (ref 80.0–100.0)
Monocytes Absolute: 0.6 10*3/uL (ref 0.1–1.0)
Monocytes Relative: 19 %
Neutro Abs: 1.5 10*3/uL — ABNORMAL LOW (ref 1.7–7.7)
Neutrophils Relative %: 51 %
Platelet Count: 208 10*3/uL (ref 150–400)
RBC: 4.11 MIL/uL — ABNORMAL LOW (ref 4.22–5.81)
RDW: 17.7 % — ABNORMAL HIGH (ref 11.5–15.5)
WBC Count: 2.9 10*3/uL — ABNORMAL LOW (ref 4.0–10.5)
nRBC: 0 % (ref 0.0–0.2)
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
## 2018-10-13 ENCOUNTER — Telehealth: Payer: Self-pay | Admitting: Hematology

## 2018-10-13 NOTE — Telephone Encounter (Signed)
Scheduled appt per 10/6 los.  Spoke with patient and he is aware of his appt date and time.

## 2018-10-14 LAB — MULTIPLE MYELOMA PANEL, SERUM
Albumin SerPl Elph-Mcnc: 3.5 g/dL (ref 2.9–4.4)
Albumin/Glob SerPl: 1.1 (ref 0.7–1.7)
Alpha 1: 0.2 g/dL (ref 0.0–0.4)
Alpha2 Glob SerPl Elph-Mcnc: 0.8 g/dL (ref 0.4–1.0)
B-Globulin SerPl Elph-Mcnc: 0.8 g/dL (ref 0.7–1.3)
Gamma Glob SerPl Elph-Mcnc: 1.4 g/dL (ref 0.4–1.8)
Globulin, Total: 3.2 g/dL (ref 2.2–3.9)
IgA: 327 mg/dL (ref 61–437)
IgG (Immunoglobin G), Serum: 1629 mg/dL — ABNORMAL HIGH (ref 603–1613)
IgM (Immunoglobulin M), Srm: 38 mg/dL (ref 15–143)
Total Protein ELP: 6.7 g/dL (ref 6.0–8.5)

## 2018-10-19 ENCOUNTER — Telehealth: Payer: Medicare HMO | Admitting: Cardiology

## 2018-10-27 ENCOUNTER — Telehealth: Payer: Self-pay | Admitting: *Deleted

## 2018-10-27 NOTE — Telephone Encounter (Signed)
Contacted patient to inform him that  lab results from 10/12/2018 have been mailed (per Dr.Kale, inform patient that results are reviewed and ok). [name] also asked that Xray results of left femur be included in mailed results.  Patient verbalized understanding

## 2018-10-29 ENCOUNTER — Other Ambulatory Visit: Payer: Self-pay

## 2018-10-29 DIAGNOSIS — C9000 Multiple myeloma not having achieved remission: Secondary | ICD-10-CM

## 2018-10-29 MED ORDER — LENALIDOMIDE 10 MG PO CAPS: 10.0000 mg | ORAL_CAPSULE | Freq: Every day | ORAL | 0 refills | Status: DC

## 2018-11-04 ENCOUNTER — Other Ambulatory Visit: Payer: Self-pay

## 2018-11-04 ENCOUNTER — Encounter: Payer: Self-pay | Admitting: Cardiology

## 2018-11-04 ENCOUNTER — Ambulatory Visit (INDEPENDENT_AMBULATORY_CARE_PROVIDER_SITE_OTHER): Payer: Medicare HMO | Admitting: Cardiology

## 2018-11-04 VITALS — BP 162/98 | HR 74 | Ht 71.0 in | Wt 178.0 lb

## 2018-11-04 DIAGNOSIS — I10 Essential (primary) hypertension: Secondary | ICD-10-CM | POA: Diagnosis not present

## 2018-11-04 DIAGNOSIS — E1159 Type 2 diabetes mellitus with other circulatory complications: Secondary | ICD-10-CM | POA: Diagnosis not present

## 2018-11-04 DIAGNOSIS — N189 Chronic kidney disease, unspecified: Secondary | ICD-10-CM | POA: Diagnosis not present

## 2018-11-04 DIAGNOSIS — I2510 Atherosclerotic heart disease of native coronary artery without angina pectoris: Secondary | ICD-10-CM

## 2018-11-04 NOTE — Patient Instructions (Signed)

## 2018-11-04 NOTE — Progress Notes (Signed)
Cardiology Office Note:    Date:  11/04/2018   ID:  Douglas Rohrer., DOB 1945-12-20, MRN [id number]  PCP:  [name], MD  Cardiologist:  Jenean Lindau, MD   Referring MD: [name], MD    ASSESSMENT:    1. Coronary artery disease involving native coronary artery of native heart without angina pectoris   2. Essential hypertension   3. Type 2 diabetes mellitus with vascular disease ([place])   4. Chronic kidney disease, unspecified CKD stage    PLAN:    In order of problems listed above:  1. Coronary artery disease: Secondary prevention stressed with the patient.  Importance of compliance with diet and medication stressed and he vocalized understanding. 2. Essential hypertension: Blood pressure is stable and diet was discussed 3. Dyslipidemia and diabetes mellitus: Diet was discussed lipids followed by primary care physician.Patient will be seen in follow-up appointment in 6 months or earlier if the patient has any concerns    Medication Adjustments/Labs and Tests Ordered: Current medicines are reviewed at length with the patient today.  Concerns regarding medicines are outlined above.  No orders of the defined types were placed in this encounter.  No orders of the defined types were placed in this encounter.    No chief complaint on file.    History of Present Illness:    Douglas Elsen. is a 73 y.o. male.  Patient has past medical history of coronary artery disease, essential hypertension, dyslipidemia and diabetes mellitus.  He has myeloma and tells me that he is in remission at this time.  He denies any problems at this time and takes care of activities of daily living.  No chest pain orthopnea or PND.  His blood pressure is elevated because he missed coming to this office and had to find his way.  He is also not taken his medications this morning.  Patient mentions to me overall that his blood pressure is fine at home.  Past Medical History:  Diagnosis  Date  . CAD (coronary artery disease) 2006   stent LAD 3.0x18 Cypher, occluded RCA  . Cancer ([place])    multiple myeloma  . Diabetes mellitus without complication ([place])   . H/O non-insulin dependent diabetes mellitus   . Hyperlipidemia   . Hypertension     Past Surgical History:  Procedure Laterality Date  . CARDIAC CATHETERIZATION  2006  . CORONARY ANGIOPLASTY WITH STENT PLACEMENT  2007  . IR FLUORO GUIDE CV LINE RIGHT  05/20/2016  . IR US GUIDE VASC ACCESS RIGHT  05/20/2016    Current Medications: Current Meds  Medication Sig  . acyclovir (ZOVIRAX) 200 MG capsule Take 1 capsule by mouth twice daily  . amLODipine (NORVASC) 10 MG tablet Take 10 mg by mouth daily.  [name] aspirin 81 MG tablet Take 81 mg by mouth 2 (two) times daily. 2 tablets (162 mg) twice daily  . atorvastatin (LIPITOR) 20 MG tablet Take 1 tablet (20 mg total) by mouth daily.  . Blood Glucose Monitoring Suppl (ACCU-CHEK AVIVA PLUS) w/Device KIT by Does not apply route.  . gabapentin (NEURONTIN) 300 MG capsule Take 900 mg by mouth 2 (two) times daily.   . Insulin Glargine (LANTUS SOLOSTAR) 100 UNIT/ML Solostar Pen Inject 20 Units into the skin daily. (Patient taking differently: Inject 16 Units into the skin daily. )  . lenalidomide (REVLIMID) 10 MG capsule Take 1 capsule (10 mg total) by mouth daily. Take for 21 days then 7 days off. Take  with water.  [name] lisinopril (PRINIVIL,ZESTRIL) 2.5 MG tablet Take 2.5 mg by mouth daily.  . metoprolol tartrate (LOPRESSOR) 100 MG tablet Take 100 mg by mouth daily.   . nitroGLYCERIN (NITROSTAT) 0.4 MG SL tablet Place 1 tablet (0.4 mg total) under the tongue every 5 (five) minutes x 3 doses as needed for chest pain.  [name] RELION PEN NEEDLES 32G X 4 MM MISC   . vitamin B-12 (CYANOCOBALAMIN) 1000 MCG tablet Take 1,000 mcg by mouth daily.     Allergies:   Patient has no known allergies.   Social History   Socioeconomic History  . Marital status: Married    Spouse name: Not on file  .  Number of children: 6  . Years of education: Not on file  . Highest education level: Not on file  Occupational History  . Occupation: retired Audiological scientist  . Financial resource strain: Not on file  . Food insecurity    Worry: Not on file    Inability: Not on file  . Transportation needs    Medical: Not on file    Non-medical: Not on file  Tobacco Use  . Smoking status: Never Smoker  . Smokeless tobacco: Never Used  Substance and Sexual Activity  . Alcohol use: No  . Drug use: No  . Sexual activity: Not Currently  Lifestyle  . Physical activity    Days per week: Not on file    Minutes per session: Not on file  . Stress: Not on file  Relationships  . Social Herbalist on phone: Not on file    Gets together: Not on file    Attends religious service: Not on file    Active member of club or organization: Not on file    Attends meetings of clubs or organizations: Not on file    Relationship status: Not on file  Other Topics Concern  . Not on file  Social History Narrative  . Not on file     Family History: The patient's family history includes Arrhythmia in his mother; Cancer in his brother; Diabetes in his brother, brother, and sister; Heart disease in his mother; Stroke in his father.  ROS:   Please see the history of present illness.    All other systems reviewed and are negative.  EKGs/Labs/Other Studies Reviewed:    The following studies were reviewed today: EKG reveals sinus rhythm and nonspecific ST-T changes.   Recent Labs: 10/12/2018: ALT 14; BUN 20; Creatinine 1.57; Hemoglobin 11.5; Platelet Count 208; Potassium 4.1; Sodium 138  Recent Lipid Panel    Component Value Date/Time   CHOL 134 04/03/2017 1125   TRIG 101 04/03/2017 1125   HDL 50 04/03/2017 1125   CHOLHDL 2.7 04/03/2017 1125   CHOLHDL 4 01/11/2013 1030   VLDL 16.8 01/11/2013 1030   LDLCALC 64 04/03/2017 1125    Physical Exam:    VS:  BP (!) 162/98 (BP Location: Right  Arm, Patient Position: Sitting, Cuff Size: Normal)   Pulse 74   Ht 5' 11"  (1.803 m)   Wt 178 lb (80.7 kg)   SpO2 100%   BMI 24.83 kg/m     Wt Readings from Last 3 Encounters:  11/04/18 178 lb (80.7 kg)  10/12/18 180 lb 11.2 oz (82 kg)  08/09/18 182 lb 12.8 oz (82.9 kg)     GEN: Patient is in no acute distress HEENT: Normal NECK: No JVD; No carotid bruits LYMPHATICS: No lymphadenopathy CARDIAC: Hear sounds  regular, 2/6 systolic murmur at the apex. RESPIRATORY:  Clear to auscultation without rales, wheezing or rhonchi  ABDOMEN: Soft, non-tender, non-distended MUSCULOSKELETAL:  No edema; No deformity  SKIN: Warm and dry NEUROLOGIC:  Alert and oriented x 3 PSYCHIATRIC:  Normal affect   Signed, Jenean Lindau, MD  11/04/2018 8:25 AM    [place]

## 2018-12-01 ENCOUNTER — Other Ambulatory Visit: Payer: Self-pay

## 2018-12-01 DIAGNOSIS — C9000 Multiple myeloma not having achieved remission: Secondary | ICD-10-CM

## 2018-12-01 MED ORDER — LENALIDOMIDE 10 MG PO CAPS: 10.0000 mg | ORAL_CAPSULE | Freq: Every day | ORAL | 0 refills | Status: DC

## 2018-12-14 ENCOUNTER — Other Ambulatory Visit: Payer: Self-pay

## 2018-12-14 DIAGNOSIS — C9001 Multiple myeloma in remission: Secondary | ICD-10-CM

## 2018-12-15 ENCOUNTER — Other Ambulatory Visit: Payer: Self-pay

## 2018-12-15 ENCOUNTER — Inpatient Hospital Stay (HOSPITAL_BASED_OUTPATIENT_CLINIC_OR_DEPARTMENT_OTHER): Payer: Medicare HMO | Admitting: Hematology

## 2018-12-15 ENCOUNTER — Inpatient Hospital Stay: Payer: Medicare HMO | Attending: Hematology

## 2018-12-15 VITALS — BP 161/77 | HR 70 | Temp 98.3°F | Resp 18 | Ht 71.0 in | Wt 178.3 lb

## 2018-12-15 DIAGNOSIS — E119 Type 2 diabetes mellitus without complications: Secondary | ICD-10-CM | POA: Insufficient documentation

## 2018-12-15 DIAGNOSIS — E785 Hyperlipidemia, unspecified: Secondary | ICD-10-CM | POA: Insufficient documentation

## 2018-12-15 DIAGNOSIS — Z79899 Other long term (current) drug therapy: Secondary | ICD-10-CM | POA: Insufficient documentation

## 2018-12-15 DIAGNOSIS — C9001 Multiple myeloma in remission: Secondary | ICD-10-CM | POA: Diagnosis not present

## 2018-12-15 DIAGNOSIS — I10 Essential (primary) hypertension: Secondary | ICD-10-CM | POA: Diagnosis not present

## 2018-12-15 DIAGNOSIS — Z794 Long term (current) use of insulin: Secondary | ICD-10-CM | POA: Diagnosis not present

## 2018-12-15 DIAGNOSIS — C9000 Multiple myeloma not having achieved remission: Secondary | ICD-10-CM | POA: Diagnosis present

## 2018-12-15 DIAGNOSIS — Z7982 Long term (current) use of aspirin: Secondary | ICD-10-CM | POA: Diagnosis not present

## 2018-12-15 LAB — CBC WITH DIFFERENTIAL (CANCER CENTER ONLY)
Abs Immature Granulocytes: 0.01 10*3/uL (ref 0.00–0.07)
Basophils Absolute: 0 10*3/uL (ref 0.0–0.1)
Basophils Relative: 0 %
Eosinophils Absolute: 0.1 10*3/uL (ref 0.0–0.5)
Eosinophils Relative: 5 %
HCT: 36 % — ABNORMAL LOW (ref 39.0–52.0)
Hemoglobin: 11.4 g/dL — ABNORMAL LOW (ref 13.0–17.0)
Immature Granulocytes: 0 %
Lymphocytes Relative: 21 %
Lymphs Abs: 0.7 10*3/uL (ref 0.7–4.0)
MCH: 27.8 pg (ref 26.0–34.0)
MCHC: 31.7 g/dL (ref 30.0–36.0)
MCV: 87.8 fL (ref 80.0–100.0)
Monocytes Absolute: 0.3 10*3/uL (ref 0.1–1.0)
Monocytes Relative: 9 %
Neutro Abs: 2 10*3/uL (ref 1.7–7.7)
Neutrophils Relative %: 65 %
Platelet Count: 164 10*3/uL (ref 150–400)
RBC: 4.1 MIL/uL — ABNORMAL LOW (ref 4.22–5.81)
RDW: 18 % — ABNORMAL HIGH (ref 11.5–15.5)
WBC Count: 3.1 10*3/uL — ABNORMAL LOW (ref 4.0–10.5)
nRBC: 0 % (ref 0.0–0.2)

## 2018-12-15 LAB — CMP (CANCER CENTER ONLY)
ALT: 20 U/L (ref 0–44)
AST: 14 U/L — ABNORMAL LOW (ref 15–41)
Albumin: 3.3 g/dL — ABNORMAL LOW (ref 3.5–5.0)
Alkaline Phosphatase: 89 U/L (ref 38–126)
Anion gap: 7 (ref 5–15)
BUN: 20 mg/dL (ref 8–23)
CO2: 28 mmol/L (ref 22–32)
Calcium: 8.4 mg/dL — ABNORMAL LOW (ref 8.9–10.3)
Chloride: 104 mmol/L (ref 98–111)
Creatinine: 1.61 mg/dL — ABNORMAL HIGH (ref 0.61–1.24)
GFR, Est AFR Am: 48 mL/min — ABNORMAL LOW (ref >60)
GFR, Estimated: 41 mL/min — ABNORMAL LOW (ref >60)
Glucose, Bld: 201 mg/dL — ABNORMAL HIGH (ref 70–99)
Potassium: 3.8 mmol/L (ref 3.5–5.1)
Sodium: 139 mmol/L (ref 135–145)
Total Bilirubin: 0.3 mg/dL (ref 0.3–1.2)
Total Protein: 7.2 g/dL (ref 6.5–8.1)

## 2018-12-15 NOTE — Progress Notes (Signed)
Douglas Kitchen  HEMATOLOGY ONCOLOGY PROGRESS NOTE  Date of service: 12/15/18  Patient Care Team: [name], MD as PCP - General (Family Medicine)  CC: f/u for multiple myeloma  Diagnosis:  IgG Kappa multiple myeloma   Current Treatment:  Revlimid maintenance   INTERVAL HISTORY:  Douglas Rose presents today for management and evaluation of his maintenance Revlimid treatment for his multiple myeloma. He is here for check-up for his maintenance Revlimd. The patient's last visit with [name] was on 10/12/2018. The pt reports that he is doing well overall.  The pt reports that he has been well and able to connect with his family through calls and cards for his birthday. Pt is still experiencing some discomfort from his arthritis but he is still able to move and get around. He does notice a bit of numbness/tingling in his fingers when he is still. Pt has had some mild diarrhea while taking Revlimid but notes that it improves when he eats applesauce regularly. He is excited about traveling with his wife after the pandemic is over.   Lab results today (12/15/18) of CBC w/diff and CMP is as follows: all values are WNL except for WBC at 3.1K, RBC at 4.10, Hgb at 11.4, HCT at 36.0, RDW at 18.0, Glucose at 201, Creatinine at 1.61, Calcium at 8.4, Albumin at 3.3, AST at 14, GFR Est Non Afr Am at 48.. 12/15/2018 MMP shows no M spike.  On review of systems, pt reports eating well, mild diarrhea, finger neuropathy and denies leg swelling, neck swelling and any other symptoms.    REVIEW OF SYSTEMS:   A 10+ POINT REVIEW OF SYSTEMS WAS OBTAINED including neurology, dermatology, psychiatry, cardiac, respiratory, lymph, extremities, GI, GU, Musculoskeletal, constitutional, breasts, reproductive, HEENT.  All pertinent positives are noted in the HPI.  All others are negative.   Past Medical History:  Diagnosis Date   CAD (coronary artery disease) 2006   stent LAD 3.0x18 [place], occluded RCA   Cancer ([place])    multiple myeloma   Diabetes mellitus without complication ([place])    H/O non-insulin dependent diabetes mellitus    Hyperlipidemia    Hypertension     . Past Surgical History:  Procedure Laterality Date   CARDIAC CATHETERIZATION  2006   CORONARY ANGIOPLASTY WITH STENT PLACEMENT  2007   IR FLUORO GUIDE CV LINE RIGHT  05/20/2016   IR US GUIDE VASC ACCESS RIGHT  05/20/2016    . Social History   Tobacco Use   Smoking status: Never Smoker   Smokeless tobacco: Never Used  Substance Use Topics   Alcohol use: No   Drug use: No    ALLERGIES:  has No Known Allergies.  MEDICATIONS:  Current Outpatient Medications  Medication Sig Dispense Refill   acyclovir (ZOVIRAX) 200 MG capsule Take 1 capsule by mouth twice daily 180 capsule 0   amLODipine (NORVASC) 10 MG tablet Take 10 mg by mouth daily.     aspirin 81 MG tablet Take 81 mg by mouth 2 (two) times daily. 2 tablets (162 mg) twice daily     atorvastatin (LIPITOR) 20 MG tablet Take 1 tablet (20 mg total) by mouth daily. 90 tablet 3   Blood Glucose Monitoring Suppl (ACCU-CHEK AVIVA PLUS) w/Device KIT by Does not apply route.     gabapentin (NEURONTIN) 300 MG capsule Take 900 mg by mouth 2 (two) times daily.      Insulin Glargine (LANTUS SOLOSTAR) 100 UNIT/ML Solostar Pen Inject 20 Units into the skin daily. (Patient  taking differently: Inject 16 Units into the skin daily. ) 15 mL 11   lenalidomide (REVLIMID) 10 MG capsule Take 1 capsule (10 mg total) by mouth daily. Take for 21 days then 7 days off. Take with water. 21 capsule 0   lisinopril (PRINIVIL,ZESTRIL) 2.5 MG tablet Take 2.5 mg by mouth daily.     metoprolol tartrate (LOPRESSOR) 100 MG tablet Take 100 mg by mouth daily.      nitroGLYCERIN (NITROSTAT) 0.4 MG SL tablet Place 1 tablet (0.4 mg total) under the tongue every 5 (five) minutes x 3 doses as needed for chest pain. 30 tablet 12   RELION PEN NEEDLES 32G X 4 MM MISC      vitamin B-12 (CYANOCOBALAMIN)  1000 MCG tablet Take 1,000 mcg by mouth daily.     No current facility-administered medications for this visit.     PHYSICAL EXAMINATION: ECOG PERFORMANCE STATUS: 1 - Symptomatic but completely ambulatory  Vitals:   12/15/18 1350  BP: (!) 161/77  Pulse: 70  Resp: 18  Temp: 98.3 F (36.8 C)  SpO2: 100%    Filed Weights   12/15/18 1350  Weight: 178 lb 4.8 oz (80.9 kg)   .Body mass index is 24.87 kg/m.   Exam was given in a chair   GENERAL:alert, in no acute distress and comfortable SKIN: no acute rashes, no significant lesions EYES: conjunctiva are pink and non-injected, sclera anicteric OROPHARYNX: MMM, no exudates, no oropharyngeal erythema or ulceration NECK: supple, no JVD LYMPH:  no palpable lymphadenopathy in the cervical, axillary or inguinal regions LUNGS: clear to auscultation b/l with normal respiratory effort HEART: regular rate & rhythm ABDOMEN:  normoactive bowel sounds , non tender, not distended. No palpable hepatosplenomegaly.  Extremity: no pedal edema PSYCH: alert & oriented x 3 with fluent speech NEURO: no focal motor/sensory deficits  LABORATORY DATA:   I have reviewed the data as listed  06/08/2018 MMP Component     Latest Ref Rng & Units 06/08/2018  IgG (Immunoglobin G), Serum     603 - 1,613 mg/dL 1,556  IgA     61 - 437 mg/dL 303  IgM (Immunoglobulin M), Srm     15 - 143 mg/dL 36  Total Protein ELP     6.0 - 8.5 g/dL 6.5  Albumin SerPl Elph-Mcnc     2.9 - 4.4 g/dL 3.2  Alpha 1     0.0 - 0.4 g/dL 0.2  Alpha2 Glob SerPl Elph-Mcnc     0.4 - 1.0 g/dL 0.8  B-Globulin SerPl Elph-Mcnc     0.7 - 1.3 g/dL 0.9  Gamma Glob SerPl Elph-Mcnc     0.4 - 1.8 g/dL 1.5  M Protein SerPl Elph-Mcnc     Not Observed g/dL Not Observed  Globulin, Total     2.2 - 3.9 g/dL 3.3  Albumin/Glob SerPl     0.7 - 1.7 1.0  IFE 1      Comment  Please Note (HCV):      Comment   . CBC Latest Ref Rng & Units 12/15/2018 10/12/2018 08/09/2018  WBC 4.0 - 10.5 K/uL  3.1(L) 2.9(L) 2.6(L)  Hemoglobin 13.0 - 17.0 g/dL 11.4(L) 11.5(L) 11.2(L)  Hematocrit 39.0 - 52.0 % 36.0(L) 36.1(L) 35.6(L)  Platelets 150 - 400 K/uL 164 208 174  ANC 1k  . CMP Latest Ref Rng & Units 12/15/2018 10/12/2018 08/09/2018  Glucose 70 - 99 mg/dL 201(H) 162(H) 167(H)  BUN 8 - 23 mg/dL 20 20 20   Creatinine 0.61 - 1.24  mg/dL 1.61(H) 1.57(H) 1.66(H)  Sodium 135 - 145 mmol/L 139 138 138  Potassium 3.5 - 5.1 mmol/L 3.8 4.1 3.7  Chloride 98 - 111 mmol/L 104 106 105  CO2 22 - 32 mmol/L 28 24 26   Calcium 8.9 - 10.3 mg/dL 8.4(L) 9.0 8.7(L)  Total Protein 6.5 - 8.1 g/dL 7.2 7.3 7.1  Total Bilirubin 0.3 - 1.2 mg/dL 0.3 0.3 0.4  Alkaline Phos 38 - 126 U/L 89 100 91  AST 15 - 41 U/L 14(L) 13(L) 13(L)  ALT 0 - 44 U/L 20 14 15        RADIOGRAPHIC STUDIES: I have personally reviewed the radiological images as listed and agreed with the findings in the report. No results found.  ASSESSMENT & PLAN:   73 y.o.  with multiple medical comorbidities but fairly good performance status overall with   1) ISS Stage II IgG Kapppa multiple myeloma. Anemia + Renal insufficiency.  Bone survey neg for concerning bone lesions. significantly elevated kappafree light chains 858, lambda 18.4, with a ratio of 46.65 . He was also noted to have an M spike of 1.7 g/dL with IFE showing IgG kappa monoclonal protein. Quantitative IgG level was increased to nearly 2500 mg/dL. 24-hour UPEP showed monoclonal protein of 1300 mg per 24 hours which constituted about 86% of all the urinary protein. Bone marrow biopsy showed 20-30% Restricted plasma cells consistent with multiple myeloma Cytogenetics/FISH  Showed +4, +11, and +17   Patient has been treated with CyBord X 2 cycles . Treatment interrupted due to severe c diff colitis with prolonged hospitalization and decline in performance status. Patient is much improved and back to baseline now. M protein undetected with neg IFE Has been treated with 5 cycles of Rd   Currently on maintenance Rituxan and in Remission.  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 Had an element of obstructive uropathy due to BPH which has resolved. Creatinine continues to be stable.  3) Normocytic anemia likely related to multiple myeloma.-stable, improved. hgb 11.5 No abdominal pain or discomfort over the right upper quadrant. No fevers or chills.  PLAN:   -Discussed pt labwork today, 12/15/18; WBC are low, Neutro, Hgb and PLT are normal, blood chemistries are stable, blood glucose is high -12/15/2018 MMP shows no M spike to suggest overt Myeloma progression -Discussed 10/12/2018 MMP shows no M Protein observed -The pt shows no clinical or lab progression of his multiple myeloma at this time.  -The pt has no prohibitive toxicities from continuing maintenance 69m Revlimid, three weeks on and one week off, at this time. -Discussed that after 2 years of progression-free illness while being on Revlimid may consider holding  -Continue 874maspirin  -Will see back in 2 months with labs, with labs 1 week prior  4) 5) DM2 - controlled -continue f/u with PCP.    FOLLOW UP: RTC with Dr KaIrene Limboith labs in 2 months. Plz schedule labs 1 week prior to clinic visit.  The total time spent in the appt was 15 minutes and more than 50% was on counseling and direct patient cares.  All of the patient's questions were answered with apparent satisfaction. The patient knows to call the clinic with any problems, questions or concerns.   GaSullivan LoneD MSKlamathAHIVMS SCTricities Endoscopy CenterTMedstar Saint Mary'S Hospitalematology/Oncology Physician CoRegency Hospital Of Meridian(Office):       [phone] cell):  [phone]):           [phone],  Yevette Edwards, am acting as a Education administrator for Dr. Sullivan Lone.   .I have reviewed the above documentation for accuracy and completeness, and I agree with the above. Brunetta Genera MD

## 2018-12-16 ENCOUNTER — Telehealth: Payer: Self-pay | Admitting: Hematology

## 2018-12-16 NOTE — Telephone Encounter (Signed)
Scheduled appt per 12/9 los.  Patient voice mail box was full, a calendar will be mailed out.

## 2018-12-19 LAB — MULTIPLE MYELOMA PANEL, SERUM
Albumin SerPl Elph-Mcnc: 3.3 g/dL (ref 2.9–4.4)
Albumin/Glob SerPl: 1.1 (ref 0.7–1.7)
Alpha 1: 0.2 g/dL (ref 0.0–0.4)
Alpha2 Glob SerPl Elph-Mcnc: 0.7 g/dL (ref 0.4–1.0)
B-Globulin SerPl Elph-Mcnc: 0.8 g/dL (ref 0.7–1.3)
Gamma Glob SerPl Elph-Mcnc: 1.6 g/dL (ref 0.4–1.8)
Globulin, Total: 3.3 g/dL (ref 2.2–3.9)
IgA: 352 mg/dL (ref 61–437)
IgG (Immunoglobin G), Serum: 1705 mg/dL — ABNORMAL HIGH (ref 603–1613)
IgM (Immunoglobulin M), Srm: 40 mg/dL (ref 15–143)
Total Protein ELP: 6.6 g/dL (ref 6.0–8.5)

## 2018-12-23 ENCOUNTER — Telehealth: Payer: Self-pay | Admitting: *Deleted

## 2018-12-23 NOTE — Telephone Encounter (Addendum)
-----  Message from Brunetta Genera, MD sent at 12/21/2018  9:57 PM EST ----- Plz let patient know his Myeloma panel shows no overt progression of his multiple myeloma --continuing current plan of care. Contacted patient regarding test results per Dr. Grier Mitts directions: Patient verbalized understanding

## 2018-12-24 ENCOUNTER — Other Ambulatory Visit: Payer: Self-pay | Admitting: *Deleted

## 2018-12-24 DIAGNOSIS — C9000 Multiple myeloma not having achieved remission: Secondary | ICD-10-CM

## 2018-12-24 MED ORDER — LENALIDOMIDE 10 MG PO CAPS: 10.0000 mg | ORAL_CAPSULE | Freq: Every day | ORAL | 0 refills | Status: DC

## 2018-12-24 NOTE — Telephone Encounter (Signed)
Refilled Revlimid per [name] OV note 12/15/2018.  Refill escribed to [place], [place]. [address] [facility] Auth# [id number], 12/24/2018

## 2019-01-20 ENCOUNTER — Telehealth: Payer: Self-pay | Admitting: *Deleted

## 2019-01-20 NOTE — Telephone Encounter (Signed)
Received a phone call from [place]. [phone]) She states they received a call from [facility] notifying them that pt was approved.  Pt/wife was informed that [employer] was sent a fax that needed documentation and was to be returned to PAF by 1/20. (nothing received at MD desk at this time)  [place], [facility] patient financial advocate to inquire if she had received this document. She has not. Contacted Ms. Roddey to inform that this document has not been received at this time. GAve her [place] FAx number and she will give to [facility].

## 2019-01-24 ENCOUNTER — Telehealth: Payer: Self-pay | Admitting: *Deleted

## 2019-01-24 ENCOUNTER — Other Ambulatory Visit: Payer: Self-pay | Admitting: *Deleted

## 2019-01-24 DIAGNOSIS — C9000 Multiple myeloma not having achieved remission: Secondary | ICD-10-CM

## 2019-01-24 MED ORDER — LENALIDOMIDE 10 MG PO CAPS: 10.0000 mg | ORAL_CAPSULE | Freq: Every day | ORAL | 0 refills | Status: DC

## 2019-01-24 NOTE — Telephone Encounter (Signed)
Received faxed refill request for Revlimid from [place], [place] [address] [place] D Escribed refill to [facility] -per [name] OV note 12/9 continuing Revlimid as currently ordered [employer] Auth# [id number], 01/24/19

## 2019-01-24 NOTE — Telephone Encounter (Signed)
Faxed documentation signed by [name], requested by patient, to [facility] [place] [facility] [phone]. Fax confirmation received.

## 2019-02-08 ENCOUNTER — Telehealth: Payer: Self-pay | Admitting: *Deleted

## 2019-02-08 NOTE — Telephone Encounter (Signed)
Patient needs to cancel appts on 2/9 and 2/16. Appointments cancelled. Will be out of town due to family member medical emergency. Asked to reschedule labs for 2/23 and MD appt to 3/9. Advised Douglas Rose that message will be sent to scheduling with this information and someone will contact to reschedule. She verbalized understanding.

## 2019-02-11 ENCOUNTER — Telehealth: Payer: Self-pay | Admitting: Hematology

## 2019-02-11 NOTE — Telephone Encounter (Signed)
Scheduled appt per 2/2 sch message - pt is aware of new appt date and time

## 2019-02-15 ENCOUNTER — Other Ambulatory Visit: Payer: Medicare HMO

## 2019-02-18 ENCOUNTER — Other Ambulatory Visit: Payer: Self-pay

## 2019-02-18 DIAGNOSIS — C9000 Multiple myeloma not having achieved remission: Secondary | ICD-10-CM

## 2019-02-18 MED ORDER — LENALIDOMIDE 10 MG PO CAPS: 10.0000 mg | ORAL_CAPSULE | Freq: Every day | ORAL | 0 refills | Status: DC

## 2019-02-22 ENCOUNTER — Ambulatory Visit: Payer: Medicare HMO | Admitting: Hematology

## 2019-02-26 ENCOUNTER — Ambulatory Visit: Payer: Medicare HMO

## 2019-03-01 ENCOUNTER — Inpatient Hospital Stay: Payer: Medicare HMO | Attending: Hematology

## 2019-03-01 ENCOUNTER — Other Ambulatory Visit: Payer: Self-pay

## 2019-03-01 DIAGNOSIS — C9000 Multiple myeloma not having achieved remission: Secondary | ICD-10-CM | POA: Insufficient documentation

## 2019-03-01 DIAGNOSIS — C9001 Multiple myeloma in remission: Secondary | ICD-10-CM

## 2019-03-01 LAB — CMP (CANCER CENTER ONLY)
ALT: 16 U/L (ref 0–44)
AST: 12 U/L — ABNORMAL LOW (ref 15–41)
Albumin: 3.4 g/dL — ABNORMAL LOW (ref 3.5–5.0)
Alkaline Phosphatase: 81 U/L (ref 38–126)
Anion gap: 7 (ref 5–15)
BUN: 16 mg/dL (ref 8–23)
CO2: 25 mmol/L (ref 22–32)
Calcium: 8.9 mg/dL (ref 8.9–10.3)
Chloride: 108 mmol/L (ref 98–111)
Creatinine: 1.46 mg/dL — ABNORMAL HIGH (ref 0.61–1.24)
GFR, Est AFR Am: 54 mL/min — ABNORMAL LOW (ref >60)
GFR, Estimated: 46 mL/min — ABNORMAL LOW (ref >60)
Glucose, Bld: 131 mg/dL — ABNORMAL HIGH (ref 70–99)
Potassium: 3.9 mmol/L (ref 3.5–5.1)
Sodium: 140 mmol/L (ref 135–145)
Total Bilirubin: 0.4 mg/dL (ref 0.3–1.2)
Total Protein: 7.5 g/dL (ref 6.5–8.1)

## 2019-03-01 LAB — CBC WITH DIFFERENTIAL/PLATELET
Abs Immature Granulocytes: 0.01 10*3/uL (ref 0.00–0.07)
Basophils Absolute: 0 10*3/uL (ref 0.0–0.1)
Basophils Relative: 1 %
Eosinophils Absolute: 0.1 10*3/uL (ref 0.0–0.5)
Eosinophils Relative: 3 %
HCT: 36.5 % — ABNORMAL LOW (ref 39.0–52.0)
Hemoglobin: 11.6 g/dL — ABNORMAL LOW (ref 13.0–17.0)
Immature Granulocytes: 0 %
Lymphocytes Relative: 25 %
Lymphs Abs: 0.7 10*3/uL (ref 0.7–4.0)
MCH: 28.2 pg (ref 26.0–34.0)
MCHC: 31.8 g/dL (ref 30.0–36.0)
MCV: 88.6 fL (ref 80.0–100.0)
Monocytes Absolute: 0.6 10*3/uL (ref 0.1–1.0)
Monocytes Relative: 19 %
Neutro Abs: 1.5 10*3/uL — ABNORMAL LOW (ref 1.7–7.7)
Neutrophils Relative %: 52 %
Platelets: 186 10*3/uL (ref 150–400)
RBC: 4.12 MIL/uL — ABNORMAL LOW (ref 4.22–5.81)
RDW: 18.4 % — ABNORMAL HIGH (ref 11.5–15.5)
WBC: 2.8 10*3/uL — ABNORMAL LOW (ref 4.0–10.5)
nRBC: 0 % (ref 0.0–0.2)

## 2019-03-02 LAB — KAPPA/LAMBDA LIGHT CHAINS
Kappa free light chain: 50.4 mg/L — ABNORMAL HIGH (ref 3.3–19.4)
Kappa, lambda light chain ratio: 1.44 (ref 0.26–1.65)
Lambda free light chains: 35 mg/L — ABNORMAL HIGH (ref 5.7–26.3)

## 2019-03-03 LAB — MULTIPLE MYELOMA PANEL, SERUM
Albumin SerPl Elph-Mcnc: 3.6 g/dL (ref 2.9–4.4)
Albumin/Glob SerPl: 1.1 (ref 0.7–1.7)
Alpha 1: 0.2 g/dL (ref 0.0–0.4)
Alpha2 Glob SerPl Elph-Mcnc: 0.8 g/dL (ref 0.4–1.0)
B-Globulin SerPl Elph-Mcnc: 0.9 g/dL (ref 0.7–1.3)
Gamma Glob SerPl Elph-Mcnc: 1.6 g/dL (ref 0.4–1.8)
Globulin, Total: 3.4 g/dL (ref 2.2–3.9)
IgA: 343 mg/dL (ref 61–437)
IgG (Immunoglobin G), Serum: 1614 mg/dL — ABNORMAL HIGH (ref 603–1613)
IgM (Immunoglobulin M), Srm: 33 mg/dL (ref 15–143)
Total Protein ELP: 7 g/dL (ref 6.0–8.5)

## 2019-03-07 ENCOUNTER — Ambulatory Visit: Payer: Medicare HMO | Attending: Internal Medicine

## 2019-03-07 DIAGNOSIS — Z23 Encounter for immunization: Secondary | ICD-10-CM

## 2019-03-07 NOTE — Progress Notes (Signed)
   [id number] [facility]  Name:  Douglas Rose.    MRN: [id number] DOB: 12-11-1945  03/07/2019  Douglas Rose was observed post Covid-19 immunization for 15 minutes without incidence. He was provided with Vaccine Information Sheet and instruction to access the V-Safe system.   Douglas Rose was instructed to call 911 with any severe reactions post vaccine: [name] Difficulty breathing  . Swelling of your face and throat  . A fast heartbeat  . A bad rash all over your body  . Dizziness and weakness    Immunizations Administered    Name Date Dose VIS Date Route   Pfizer COVID-19 Vaccine 03/07/2019 12:09 PM 0.3 mL 12/17/2018 Intramuscular   Manufacturer: [place]   Lot: [id number]   [place]: [id number]

## 2019-03-15 ENCOUNTER — Inpatient Hospital Stay: Payer: Medicare HMO | Attending: Hematology | Admitting: Hematology

## 2019-03-15 ENCOUNTER — Other Ambulatory Visit: Payer: Self-pay

## 2019-03-15 VITALS — BP 135/74 | HR 61 | Temp 98.7°F | Resp 17 | Ht 71.0 in | Wt 178.2 lb

## 2019-03-15 DIAGNOSIS — D702 Other drug-induced agranulocytosis: Secondary | ICD-10-CM

## 2019-03-15 DIAGNOSIS — Z79899 Other long term (current) drug therapy: Secondary | ICD-10-CM | POA: Diagnosis not present

## 2019-03-15 DIAGNOSIS — C9001 Multiple myeloma in remission: Secondary | ICD-10-CM | POA: Diagnosis not present

## 2019-03-15 DIAGNOSIS — D638 Anemia in other chronic diseases classified elsewhere: Secondary | ICD-10-CM | POA: Diagnosis not present

## 2019-03-15 DIAGNOSIS — C9000 Multiple myeloma not having achieved remission: Secondary | ICD-10-CM | POA: Insufficient documentation

## 2019-03-15 DIAGNOSIS — N19 Unspecified kidney failure: Secondary | ICD-10-CM | POA: Insufficient documentation

## 2019-03-15 DIAGNOSIS — Z7982 Long term (current) use of aspirin: Secondary | ICD-10-CM | POA: Diagnosis not present

## 2019-03-15 DIAGNOSIS — E119 Type 2 diabetes mellitus without complications: Secondary | ICD-10-CM | POA: Insufficient documentation

## 2019-03-15 NOTE — Progress Notes (Signed)
Douglas Rose  HEMATOLOGY ONCOLOGY PROGRESS NOTE  Date of service: 03/15/19  Patient Care Team: [name], MD as PCP - General (Family Medicine)  CC: f/u for multiple myeloma  Diagnosis:  IgG Kappa multiple myeloma   Current Treatment:  Revlimid maintenance   INTERVAL HISTORY:  [name] presents today for management and evaluation of his maintenance Revlimid treatment for his multiple myeloma. He is here for check-up for his maintenance Revlimd. The patient's last visit with [name] was on 12/15/2018. The pt reports that he is doing well overall.  The pt reports that he has been well and has no new concerns. He has had his first dose of the COVID19 vaccine and has the second dose scheduled. Pt denies any concerns with taking Revlimid. Pt has some spots on his lower left leg for which he is unsure of the origin. He denies any recent leg injury. He currently being referred to an ENT and surgeon for the benign mass behind his right ear.   Lab results (03/01/2019) of CBC w/diff and CMP is as follows: all values are WNL except for WBC at 2.8K, RBC at 4.12, Hgb at 11.6, HCT at 36.5, RDW at 18.4, Neutro Abs at 1.5K, Glucose at 131, Creatinine at 1.46, Albumin at 3.4, AST at 12, GFR Est Non Af Am at 46. 03/01/2019 K/L light chain is as follows: Kappa free light chain at 50.4, Lamda free light chains at 35.0, K/L light chain ratio at 1.44.  03/01/2019 MMP is as follows: all values are WNL except for IgG at 1614.  On review of systems, pt reports LLE spots and denies diarrhea, skin rashes, itching, bone pain and any other symptoms.    REVIEW OF SYSTEMS:   A 10+ POINT REVIEW OF SYSTEMS WAS OBTAINED including neurology, dermatology, psychiatry, cardiac, respiratory, lymph, extremities, GI, GU, Musculoskeletal, constitutional, breasts, reproductive, HEENT.  All pertinent positives are noted in the HPI.  All others are negative.   Past Medical History:  Diagnosis Date  . CAD (coronary artery disease)  2006   stent LAD 3.0x18 Cypher, occluded RCA  . Cancer ([place])    multiple myeloma  . Diabetes mellitus without complication ([place])   . H/O non-insulin dependent diabetes mellitus   . Hyperlipidemia   . Hypertension     . Past Surgical History:  Procedure Laterality Date  . CARDIAC CATHETERIZATION  2006  . CORONARY ANGIOPLASTY WITH STENT PLACEMENT  2007  . IR FLUORO GUIDE CV LINE RIGHT  05/20/2016  . IR US GUIDE VASC ACCESS RIGHT  05/20/2016    . Social History   Tobacco Use  . Smoking status: Never Smoker  . Smokeless tobacco: Never Used  Substance Use Topics  . Alcohol use: No  . Drug use: No    ALLERGIES:  has No Known Allergies.  MEDICATIONS:  Current Outpatient Medications  Medication Sig Dispense Refill  . acyclovir (ZOVIRAX) 200 MG capsule Take 1 capsule by mouth twice daily 180 capsule 0  . amLODipine (NORVASC) 10 MG tablet Take 10 mg by mouth daily.    Douglas Rose aspirin 81 MG tablet Take 81 mg by mouth 2 (two) times daily. 2 tablets (162 mg) twice daily    . atorvastatin (LIPITOR) 20 MG tablet Take 1 tablet (20 mg total) by mouth daily. 90 tablet 3  . Blood Glucose Monitoring Suppl (ACCU-CHEK AVIVA PLUS) w/Device KIT by Does not apply route.    . gabapentin (NEURONTIN) 300 MG capsule Take 900 mg by mouth 2 (two) times  daily.     . Insulin Glargine (LANTUS SOLOSTAR) 100 UNIT/ML Solostar Pen Inject 20 Units into the skin daily. (Patient taking differently: Inject 16 Units into the skin daily. ) 15 mL 11  . lenalidomide (REVLIMID) 10 MG capsule Take 1 capsule (10 mg total) by mouth daily. Take for 21 days then 7 days off. Take with water. 21 capsule 0  . lisinopril (PRINIVIL,ZESTRIL) 2.5 MG tablet Take 2.5 mg by mouth daily.    . metoprolol tartrate (LOPRESSOR) 100 MG tablet Take 100 mg by mouth daily.     . nitroGLYCERIN (NITROSTAT) 0.4 MG SL tablet Place 1 tablet (0.4 mg total) under the tongue every 5 (five) minutes x 3 doses as needed for chest pain. 30 tablet 12  . RELION  PEN NEEDLES 32G X 4 MM MISC     . vitamin B-12 (CYANOCOBALAMIN) 1000 MCG tablet Take 1,000 mcg by mouth daily.     No current facility-administered medications for this visit.    PHYSICAL EXAMINATION: ECOG PERFORMANCE STATUS: 1 - Symptomatic but completely ambulatory  Vitals:   03/15/19 0932  BP: 135/74  Pulse: 61  Resp: 17  Temp: 98.7 F (37.1 C)  SpO2: 100%    Filed Weights   03/15/19 0932  Weight: 178 lb 3.2 oz (80.8 kg)   .Body mass index is 24.85 kg/m.   GENERAL:alert, in no acute distress and comfortable SKIN: no acute rashes, no significant lesions EYES: conjunctiva are pink and non-injected, sclera anicteric OROPHARYNX: MMM, no exudates, no oropharyngeal erythema or ulceration NECK: supple, no JVD LYMPH:  no palpable lymphadenopathy in the cervical, axillary or inguinal regions LUNGS: clear to auscultation b/l with normal respiratory effort HEART: regular rate & rhythm ABDOMEN:  normoactive bowel sounds , non tender, not distended. No palpable hepatosplenomegaly.  Extremity: no pedal edema PSYCH: alert & oriented x 3 with fluent speech NEURO: no focal motor/sensory deficits  LABORATORY DATA:   I have reviewed the data as listed  06/08/2018 MMP Component     Latest Ref Rng & Units 06/08/2018  IgG (Immunoglobin G), Serum     603 - 1,613 mg/dL 1,556  IgA     61 - 437 mg/dL 303  IgM (Immunoglobulin M), Srm     15 - 143 mg/dL 36  Total Protein ELP     6.0 - 8.5 g/dL 6.5  Albumin SerPl Elph-Mcnc     2.9 - 4.4 g/dL 3.2  Alpha 1     0.0 - 0.4 g/dL 0.2  Alpha2 Glob SerPl Elph-Mcnc     0.4 - 1.0 g/dL 0.8  B-Globulin SerPl Elph-Mcnc     0.7 - 1.3 g/dL 0.9  Gamma Glob SerPl Elph-Mcnc     0.4 - 1.8 g/dL 1.5  M Protein SerPl Elph-Mcnc     Not Observed g/dL Not Observed  Globulin, Total     2.2 - 3.9 g/dL 3.3  Albumin/Glob SerPl     0.7 - 1.7 1.0  IFE 1      Comment  Please Note (HCV):      Comment   . CBC Latest Ref Rng & Units 03/01/2019 12/15/2018  10/12/2018  WBC 4.0 - 10.5 K/uL 2.8(L) 3.1(L) 2.9(L)  Hemoglobin 13.0 - 17.0 g/dL 11.6(L) 11.4(L) 11.5(L)  Hematocrit 39.0 - 52.0 % 36.5(L) 36.0(L) 36.1(L)  Platelets 150 - 400 K/uL 186 164 208  ANC 1k  . CMP Latest Ref Rng & Units 03/01/2019 12/15/2018 10/12/2018  Glucose 70 - 99 mg/dL 131(H) 201(H) 162(H)  BUN  8 - 23 mg/dL _0 Creatinine 0.61 - 1.24 mg/dL 1.46(H) 1.61(H) 1.57(H)  Sodium 135 - 145 mmol/L 140 139 138  Potassium 3.5 - 5.1 mmol/L 3.9 3.8 4.1  Chloride 98 - 111 mmol/L 108 104 106  CO2 22 - 32 mmol/L _1 Calcium 8.9 - 10.3 mg/dL 8.9 8.4(L) 9.0  Total Protein 6.5 - 8.1 g/dL 7.5 7.2 7.3  Total Bilirubin 0.3 - 1.2 mg/dL 0.4 0.3 0.3  Alkaline Phos 38 - 126 U/L 81 89 100  AST 15 - 41 U/L 12(L) 14(L) 13(L)  ALT 0 - 44 U/L _2 RADIOGRAPHIC STUDIES: I have personally reviewed the radiological images as listed and agreed with the findings in the report. No results found.  ASSESSMENT & PLAN:   74 y.o.  with multiple medical comorbidities but fairly good performance status overall with   1) ISS Stage II IgG Kapppa multiple myeloma. Anemia + Renal insufficiency.  Bone survey neg for concerning bone lesions. significantly elevated kappafree light chains 858, lambda 18.4, with a ratio of 46.65 . He was also noted to have an M spike of 1.7 g/dL with IFE showing IgG kappa monoclonal protein. Quantitative IgG level was increased to nearly 2500 mg/dL. 24-hour UPEP showed monoclonal protein of 1300 mg per 24 hours which constituted about 86% of all the urinary protein. Bone marrow biopsy showed 20-30% Restricted plasma cells consistent with multiple myeloma Cytogenetics/FISH  Showed +4, +11, and +17   Patient has been treated with CyBord X 2 cycles . Treatment interrupted due to severe c diff colitis with prolonged hospitalization and decline in performance status. Patient is much improved and back to baseline now. M protein undetected with neg IFE Has  been treated with 5 cycles of Rd  Currently on maintenance Rituxan and in Remission.  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 Had an element of obstructive uropathy due to BPH which has resolved. Creatinine continues to be stable.  3) Normocytic anemia likely related to multiple myeloma.-stable, improved. hgb 11.5 No abdominal pain or discomfort over the right upper quadrant. No fevers or chills.  PLAN:   -Discussed pt labwork, 03/01/2019; mild leukopenia, blood chemistries show stable kidney function - leukopenia likely due to Revlimid -Discussed 03/01/2019 K/L light chains are normal, K/L light chain ratio at 1.44.  -Discussed 03/01/2019 MMP shows no M Protein observed  -Pt is still in laboratory and clinical remission of Multiple Myeloma  -Will continue to monitor with labs and clinic visits every 2-3 months  -The pt has no prohibitive toxicities from continuing maintenance 49m Revlimid, three weeks on and one week off, at this time. -Recommend pt take probiotic if given antibiotics for surgery, given Hx of C. diff -Recommend pt f/u as scheduled for the second dose of the COVID19 vaccine -Continue 837maspirin  -Will see back in 2 months, with labs 1 week prior   4) 5) DM2 - controlled -continue f/u with PCP.    FOLLOW UP: RTC with Dr KaIrene Limboith labs in 2 months. Plz schedule labs 1 week prior to clinic visit.   The total time spent in the appt was 20 minutes and more than 50% was on counseling and direct patient cares.  All of the patient's questions were answered with apparent satisfaction. The patient knows to call the clinic with any problems, questions or concerns.   GaSullivan LoneD MSBrownsvilleAHIVMS SCGrace Medical CenterTOrange City Municipal Hospitalematology/Oncology  Physician [facility]  (Office):       [phone] (Work cell):  [phone] (Fax):           [phone]  I, Yevette Edwards, am acting as a scribe for Dr. Sullivan Lone.   .I have  reviewed the above documentation for accuracy and completeness, and I agree with the above. Brunetta Genera MD

## 2019-03-22 ENCOUNTER — Other Ambulatory Visit: Payer: Self-pay | Admitting: *Deleted

## 2019-03-22 DIAGNOSIS — C9000 Multiple myeloma not having achieved remission: Secondary | ICD-10-CM

## 2019-03-22 MED ORDER — LENALIDOMIDE 10 MG PO CAPS: 10.0000 mg | ORAL_CAPSULE | Freq: Every day | ORAL | 0 refills | Status: DC

## 2019-03-22 NOTE — Telephone Encounter (Signed)
Received faxed refill request from [place] for Revlimid 10 mg Refill escribed to [place], [place]. [place]. [address] per [name] OV note 03/15/19 [employer] Auth# [id number], 03/22/19

## 2019-04-06 ENCOUNTER — Ambulatory Visit: Payer: Medicare HMO | Attending: Internal Medicine

## 2019-04-06 DIAGNOSIS — Z23 Encounter for immunization: Secondary | ICD-10-CM

## 2019-04-06 NOTE — Progress Notes (Signed)
   [id number]  Name:  Douglas Rose.    MRN: [id number] DOB: 1945-10-22  04/06/2019  Douglas Rose was observed post Covid-19 immunization for 15 minutes without incident. He was provided with Vaccine Information Sheet and instruction to access the V-Safe system.   Douglas Rose was instructed to call 911 with any severe reactions post vaccine: [name] Difficulty breathing  . Swelling of face and throat  . A fast heartbeat  . A bad rash all over body  . Dizziness and weakness   Immunizations Administered    Name Date Dose VIS Date Route   Pfizer COVID-19 Vaccine 04/06/2019 11:26 AM 0.3 mL 12/17/2018 Intramuscular   Manufacturer: [place]   Lot: [id number]   [place]: [id number]

## 2019-04-10 ENCOUNTER — Other Ambulatory Visit: Payer: Self-pay | Admitting: Hematology

## 2019-04-10 DIAGNOSIS — C9000 Multiple myeloma not having achieved remission: Secondary | ICD-10-CM

## 2019-04-11 NOTE — Telephone Encounter (Signed)
Refill reqested

## 2019-04-14 ENCOUNTER — Other Ambulatory Visit: Payer: Self-pay | Admitting: *Deleted

## 2019-04-14 DIAGNOSIS — C9000 Multiple myeloma not having achieved remission: Secondary | ICD-10-CM

## 2019-04-14 MED ORDER — LENALIDOMIDE 10 MG PO CAPS: 10.0000 mg | ORAL_CAPSULE | Freq: Every day | ORAL | 0 refills | Status: DC

## 2019-04-14 NOTE — Telephone Encounter (Signed)
Received refill request from [place] for Revlimid 10 mg Refill sent via escribe per OV note 03/15/19 :"no prohibitive toxicities from continuing maintenance 10mg  Revlimid, three weeks on and one week off, at this time", to [place], [address] [address] [place] # [id number], 04/14/19

## 2019-04-19 ENCOUNTER — Telehealth: Payer: Self-pay | Admitting: Hematology

## 2019-04-19 NOTE — Telephone Encounter (Signed)
Faxed medical records to [facility] at [phone]. Release [id number]

## 2019-04-21 ENCOUNTER — Ambulatory Visit: Payer: Medicare HMO | Admitting: Cardiology

## 2019-05-06 ENCOUNTER — Other Ambulatory Visit: Payer: Self-pay | Admitting: Hematology

## 2019-05-06 ENCOUNTER — Other Ambulatory Visit: Payer: Self-pay

## 2019-05-06 DIAGNOSIS — C9000 Multiple myeloma not having achieved remission: Secondary | ICD-10-CM

## 2019-05-06 MED ORDER — LENALIDOMIDE 10 MG PO CAPS: 10.0000 mg | ORAL_CAPSULE | Freq: Every day | ORAL | 0 refills | Status: DC

## 2019-05-06 NOTE — Telephone Encounter (Signed)
Revlimid 10mg  Medication refilled and sent to [facility]

## 2019-05-10 ENCOUNTER — Inpatient Hospital Stay: Payer: Medicare HMO | Attending: Hematology

## 2019-05-10 DIAGNOSIS — D6489 Other specified anemias: Secondary | ICD-10-CM | POA: Insufficient documentation

## 2019-05-10 DIAGNOSIS — C9000 Multiple myeloma not having achieved remission: Secondary | ICD-10-CM | POA: Insufficient documentation

## 2019-05-10 DIAGNOSIS — N189 Chronic kidney disease, unspecified: Secondary | ICD-10-CM | POA: Insufficient documentation

## 2019-05-10 DIAGNOSIS — E118 Type 2 diabetes mellitus with unspecified complications: Secondary | ICD-10-CM | POA: Insufficient documentation

## 2019-05-11 ENCOUNTER — Encounter: Payer: Self-pay | Admitting: Cardiology

## 2019-05-11 ENCOUNTER — Ambulatory Visit: Payer: Medicare HMO | Admitting: Cardiology

## 2019-05-11 ENCOUNTER — Other Ambulatory Visit: Payer: Self-pay

## 2019-05-11 VITALS — BP 156/80 | HR 56 | Ht 71.0 in | Wt 176.0 lb

## 2019-05-11 DIAGNOSIS — E782 Mixed hyperlipidemia: Secondary | ICD-10-CM

## 2019-05-11 DIAGNOSIS — I2510 Atherosclerotic heart disease of native coronary artery without angina pectoris: Secondary | ICD-10-CM

## 2019-05-11 DIAGNOSIS — I10 Essential (primary) hypertension: Secondary | ICD-10-CM | POA: Diagnosis not present

## 2019-05-11 NOTE — Patient Instructions (Signed)

## 2019-05-11 NOTE — Progress Notes (Signed)
Cardiology Office Note:    Date:  05/11/2019   ID:  Douglas Rohrer., DOB 07-Apr-1945, MRN [id number]  PCP:  [name], MD  Cardiologist:  Jenean Lindau, MD   Referring MD: [name], MD    ASSESSMENT:    1. Coronary artery disease involving native coronary artery of native heart without angina pectoris   2. Mixed hyperlipidemia   3. Essential hypertension    PLAN:    In order of problems listed above:  1. Coronary artery disease: Secondary prevention stressed with the patient.  Importance of compliance with diet and medication stressed and he vocalized understanding.  I told him to walk at least half an hour a day 5 days a week and he promises to do so. 2. Essential hypertension: Blood pressure stable patient checks his blood pressure at home and mentioned numbers and they are fine.  He has an element of whitecoat hypertension. 3. Mixed dyslipidemia: Managed by primary care physician.  He tells me he is due for complete blood work in the month of June and he will keep his appointment and send me a copy of the records. 4. Patient will be seen in follow-up appointment in 6 months or earlier if the patient has any concerns    Medication Adjustments/Labs and Tests Ordered: Current medicines are reviewed at length with the patient today.  Concerns regarding medicines are outlined above.  No orders of the defined types were placed in this encounter.  No orders of the defined types were placed in this encounter.    Chief Complaint  Patient presents with   Follow-up    6 Months     History of Present Illness:    Douglas Deeb. is a 74 y.o. male.  Patient has past medical history of coronary artery disease, essential hypertension dyslipidemia and multiple myeloma.  He has renal insufficiency.  He denies any problems at this time and takes care of activities of daily living.  No chest pain orthopnea or PND.  At the time of my evaluation, the patient is alert  awake oriented and in no distress.  He leads a sedentary lifestyle and wants to start walking on a regular basis again.  Past Medical History:  Diagnosis Date   Abdominal fullness 02/26/2016   Last Assessment & Plan:  overall improving with no pain, no further nausea and normal BM today.  No signs of obstruction or infection today.  Due for 10 year colonoscopy, mild anemia in December, will refer to GI   Acute renal failure ([place]) 03/06/2016   Acute renal failure superimposed on chronic kidney disease ([place]) 03/01/2016   AKI (acute kidney injury) ([place])    Arterial insufficiency of lower extremity ([place]) 09/01/2016   Overview:  Mild (2018)  Last Assessment & Plan:  Mild arterial insufficiency, he does not have classic symptoms of claudication. Advised daily foot checks and would consider further evaluation if new or worsening symptoms  Mild (2018)  Last Assessment & Plan:  asymptomatic.  We will follow-up as needed Formatting of this note might be different from the original. Mild (2018)  Last Assessment & Pla   CAD (coronary artery disease) 2006   stent LAD 3.0x18 Cypher, occluded RCA   Cancer ([place])    multiple myeloma   Cervical lymphadenopathy 03/31/2016   Colitis 05/12/2016   Coronary artery disease 07/21/2011   stent LAD 3.0x18 Cypher, occluded RCA  Overview:  Stent 2006 at [facility], Low risk stress  test feb 2017  Last Assessment & Plan:  Declined further stress testing in 2015 due to cost and absence of symptoms.  Continues to be asymptomatic.  He has upcoming follow-up with cardiology for review. Encouraged to keep appt  Stent 2006 at [facility], Low risk s   Diabetes mellitus without complication [facility])    DNR (do not resuscitate) discussion    Enlarged prostate 03/02/2016   Enteritis due to Clostridium difficile    Epidermoid cyst of skin 03/31/2016   Generalized abdominal pain    H/O non-insulin dependent diabetes mellitus     Hyperlipidemia    Hypertension    Infected tooth 03/31/2016   Leukocytosis    Lipoma 07/19/2014   Overview:  Right scalp.  Seen by Payton Mccallum in past  Last Assessment & Plan:  Lipoma on right scalp getting larger, will refer to ENT, but more concerned about lymph node   Lymphadenopathy 11/21/2015   Last Assessment & Plan:  Right submandibular lymph node, non-tender, now larger. CBC 2 months ago showed mild anemia, normal peripheral smear.  Will recheck today along with ESR, sed rate.  Will refer to ENT for further evaluation.    Last Assessment & Plan:   Soft, right-sided postauricular mass, deferred intervention in 2018 at onset of myeloma diagnosis, gradually enlarging over time.  Had cons   Monoclonal gammopathy    Multiple myeloma ([place]) 03/20/2016   Last Assessment & Plan:  Given dx, rec social distancing, avoiding in person jury duty.  Letter provided Formatting of this note might be different from the original. Last Assessment & Plan:  Myeloma treatment is going well, he has upcoming oncology visit   Palliative care by specialist    Pancreatitis 03/02/2016   Peripheral neuropathy 07/22/2016   Last Assessment & Plan:  Overall improved. Patient has discontinued gabapentin, did not feel much difference  Feet and fingers.  Multifactorial, DM, Chemo, MM  Last Assessment & Plan:   Doing well on gabapentin Formatting of this note might be different from the original. Last Assessment & Plan:  Overall improved. Patient has discontinued gabapentin, did not feel much difference   Physical deconditioning 06/13/2016   Last Assessment & Plan:  Marked weakness.  Will request further home assistance per home health with bathing.  Discharged on a supplement- patient reports is unaffordable.  Advised prefer food as no barriers, appetite reasonable, ok to use glucerna or carnation instant breakfast if desired.   Pre-operative clearance 01/30/2017   Protein-calorie malnutrition, severe 03/03/2016   Sinus  tachycardia 05/13/2016   SIRS (systemic inflammatory response syndrome) ([place]) 05/12/2016   Stage 3 chronic kidney disease 06/13/2016   Last Assessment & Plan:  Will check Cr/K today  June 2020, annual follow-up [place] kidney  Last Assessment & Plan:   Recent labs reviewed- stable Formatting of this note might be different from the original. Last Assessment & Plan:  Will check Cr/K today   Type 2 diabetes mellitus ([place]) 07/21/2011   Last Assessment & Plan:   Diabetes improved with fasting glucoses in a safe range.  Continue current regimen. Formatting of this note might be different from the original. Last Assessment & Plan:  Patient's diabetes has been well controlled at baseline on 12 units of Lantus daily.  He reports some hyperglycemia on the first few days after dexamethasone with resolution by the end of the week. We'll   Upper abdominal pain    Urinary retention    Weakness generalized  Past Surgical History:  Procedure Laterality Date   CARDIAC CATHETERIZATION  2006   CORONARY ANGIOPLASTY WITH STENT PLACEMENT  2007   IR FLUORO GUIDE CV LINE RIGHT  05/20/2016   IR US GUIDE VASC ACCESS RIGHT  05/20/2016    Current Medications: Current Meds  Medication Sig   acyclovir (ZOVIRAX) 200 MG capsule Take 1 capsule by mouth twice daily   amLODipine (NORVASC) 10 MG tablet Take 10 mg by mouth daily.   aspirin 81 MG tablet Take 81 mg by mouth 2 (two) times daily. 2 tablets (162 mg) twice daily   atorvastatin (LIPITOR) 20 MG tablet Take 1 tablet (20 mg total) by mouth daily.   Blood Glucose Monitoring Suppl (ACCU-CHEK AVIVA PLUS) w/Device KIT by Does not apply route.   gabapentin (NEURONTIN) 300 MG capsule Take 900 mg by mouth 2 (two) times daily.    Insulin Glargine (LANTUS SOLOSTAR) 100 UNIT/ML Solostar Pen Inject 20 Units into the skin daily. (Patient taking differently: Inject 16 Units into the skin daily. )   lenalidomide (REVLIMID) 10 MG capsule Take 1 capsule (10 mg total)  by mouth daily. Take for 21 days then 7 days off. Take with water.   lisinopril (PRINIVIL,ZESTRIL) 2.5 MG tablet Take 2.5 mg by mouth daily.   metoprolol tartrate (LOPRESSOR) 100 MG tablet Take 100 mg by mouth daily.    nitroGLYCERIN (NITROSTAT) 0.4 MG SL tablet Place 1 tablet (0.4 mg total) under the tongue every 5 (five) minutes x 3 doses as needed for chest pain.   RELION PEN NEEDLES 32G X 4 MM MISC    vitamin B-12 (CYANOCOBALAMIN) 1000 MCG tablet Take 1,000 mcg by mouth daily.     Allergies:   Patient has no known allergies.   Social History   Socioeconomic History   Marital status: Married    Spouse name: Not on file   Number of children: 6   Years of education: Not on file   Highest education level: Not on file  Occupational History   Occupation: retired Company secretary  Tobacco Use   Smoking status: Never Smoker   Smokeless tobacco: Never Used  Substance and Sexual Activity   Alcohol use: No   Drug use: No   Sexual activity: Not Currently  Other Topics Concern   Not on file  Social History Narrative   Not on file   Social Determinants of Health   Financial Resource Strain:    Difficulty of Paying Living Expenses:   Food Insecurity:    Worried About Charity fundraiser in the Last Year:    Arboriculturist in the Last Year:   Transportation Needs:    Film/video editor (Medical):    Lack of Transportation (Non-Medical):   Physical Activity:    Days of Exercise per Week:    Minutes of Exercise per Session:   Stress:    Feeling of Stress :   Social Connections:    Frequency of Communication with Friends and Family:    Frequency of Social Gatherings with Friends and Family:    Attends Religious Services:    Active Member of Clubs or Organizations:    Attends Music therapist:    Marital Status:      Family History: The patient's family history includes Arrhythmia in his mother; Cancer in his brother; Diabetes in his  brother, brother, and sister; Heart disease in his mother; Stroke in his father.  ROS:   Please see the history of present illness.  All other systems reviewed and are negative.  EKGs/Labs/Other Studies Reviewed:    The following studies were reviewed today: I discussed my findings with the patient at length   Recent Labs: 03/01/2019: ALT 16; BUN 16; Creatinine 1.46; Hemoglobin 11.6; Platelets 186; Potassium 3.9; Sodium 140  Recent Lipid Panel    Component Value Date/Time   CHOL 134 04/03/2017 1125   TRIG 101 04/03/2017 1125   HDL 50 04/03/2017 1125   CHOLHDL 2.7 04/03/2017 1125   CHOLHDL 4 01/11/2013 1030   VLDL 16.8 01/11/2013 1030   LDLCALC 64 04/03/2017 1125    Physical Exam:    VS:  BP (!) 156/80    Pulse (!) 56    Ht 5' 11"  (1.803 m)    Wt 176 lb (79.8 kg)    SpO2 98%    BMI 24.55 kg/m     Wt Readings from Last 3 Encounters:  05/11/19 176 lb (79.8 kg)  11/04/18 178 lb (80.7 kg)  04/29/16 167 lb 12.8 oz (76.1 kg)     GEN: Patient is in no acute distress HEENT: Normal NECK: No JVD; No carotid bruits LYMPHATICS: No lymphadenopathy CARDIAC: Hear sounds regular, 2/6 systolic murmur at the apex. RESPIRATORY:  Clear to auscultation without rales, wheezing or rhonchi  ABDOMEN: Soft, non-tender, non-distended MUSCULOSKELETAL:  No edema; No deformity  SKIN: Warm and dry NEUROLOGIC:  Alert and oriented x 3 PSYCHIATRIC:  Normal affect   Signed, Jenean Lindau, MD  05/11/2019 11:46 AM    [place]

## 2019-05-17 ENCOUNTER — Inpatient Hospital Stay: Payer: Medicare HMO

## 2019-05-17 ENCOUNTER — Other Ambulatory Visit: Payer: Self-pay

## 2019-05-17 ENCOUNTER — Telehealth: Payer: Self-pay | Admitting: Hematology

## 2019-05-17 ENCOUNTER — Inpatient Hospital Stay: Payer: Medicare HMO | Admitting: Hematology

## 2019-05-17 VITALS — BP 149/76 | HR 64 | Temp 98.5°F | Resp 18 | Ht 71.0 in | Wt 176.5 lb

## 2019-05-17 DIAGNOSIS — C9001 Multiple myeloma in remission: Secondary | ICD-10-CM | POA: Diagnosis not present

## 2019-05-17 DIAGNOSIS — D702 Other drug-induced agranulocytosis: Secondary | ICD-10-CM

## 2019-05-17 DIAGNOSIS — C9000 Multiple myeloma not having achieved remission: Secondary | ICD-10-CM | POA: Diagnosis present

## 2019-05-17 DIAGNOSIS — E118 Type 2 diabetes mellitus with unspecified complications: Secondary | ICD-10-CM | POA: Diagnosis not present

## 2019-05-17 DIAGNOSIS — N189 Chronic kidney disease, unspecified: Secondary | ICD-10-CM | POA: Diagnosis not present

## 2019-05-17 DIAGNOSIS — D6489 Other specified anemias: Secondary | ICD-10-CM | POA: Diagnosis not present

## 2019-05-17 LAB — CBC WITH DIFFERENTIAL/PLATELET
Abs Immature Granulocytes: 0.02 10*3/uL (ref 0.00–0.07)
Basophils Absolute: 0 10*3/uL (ref 0.0–0.1)
Basophils Relative: 0 %
Eosinophils Absolute: 0.2 10*3/uL (ref 0.0–0.5)
Eosinophils Relative: 5 %
HCT: 39.5 % (ref 39.0–52.0)
Hemoglobin: 12.3 g/dL — ABNORMAL LOW (ref 13.0–17.0)
Immature Granulocytes: 1 %
Lymphocytes Relative: 24 %
Lymphs Abs: 0.8 10*3/uL (ref 0.7–4.0)
MCH: 27.5 pg (ref 26.0–34.0)
MCHC: 31.1 g/dL (ref 30.0–36.0)
MCV: 88.4 fL (ref 80.0–100.0)
Monocytes Absolute: 0.5 10*3/uL (ref 0.1–1.0)
Monocytes Relative: 15 %
Neutro Abs: 1.9 10*3/uL (ref 1.7–7.7)
Neutrophils Relative %: 55 %
Platelets: 192 10*3/uL (ref 150–400)
RBC: 4.47 MIL/uL (ref 4.22–5.81)
RDW: 17.8 % — ABNORMAL HIGH (ref 11.5–15.5)
WBC: 3.5 10*3/uL — ABNORMAL LOW (ref 4.0–10.5)
nRBC: 0 % (ref 0.0–0.2)

## 2019-05-17 LAB — CMP (CANCER CENTER ONLY)
ALT: 22 U/L (ref 0–44)
AST: 16 U/L (ref 15–41)
Albumin: 3.7 g/dL (ref 3.5–5.0)
Alkaline Phosphatase: 93 U/L (ref 38–126)
Anion gap: 10 (ref 5–15)
BUN: 23 mg/dL (ref 8–23)
CO2: 25 mmol/L (ref 22–32)
Calcium: 9.1 mg/dL (ref 8.9–10.3)
Chloride: 104 mmol/L (ref 98–111)
Creatinine: 1.69 mg/dL — ABNORMAL HIGH (ref 0.61–1.24)
GFR, Est AFR Am: 45 mL/min — ABNORMAL LOW (ref >60)
GFR, Estimated: 39 mL/min — ABNORMAL LOW (ref >60)
Glucose, Bld: 114 mg/dL — ABNORMAL HIGH (ref 70–99)
Potassium: 3.8 mmol/L (ref 3.5–5.1)
Sodium: 139 mmol/L (ref 135–145)
Total Bilirubin: 0.5 mg/dL (ref 0.3–1.2)
Total Protein: 8.4 g/dL — ABNORMAL HIGH (ref 6.5–8.1)

## 2019-05-17 NOTE — Telephone Encounter (Signed)
Scheduled per 5/11 los. Printed out AVS and calender for pt.

## 2019-05-17 NOTE — Progress Notes (Signed)
Douglas Rose  HEMATOLOGY ONCOLOGY PROGRESS NOTE  Date of service: 05/17/19  Patient Care Team: [name], MD as PCP - General (Family Medicine)  CC: f/u for multiple myeloma  Diagnosis:  IgG Kappa multiple myeloma   Current Treatment:  Revlimid maintenance   INTERVAL HISTORY: [name] presents today for management and evaluation of his maintenance Revlimid for his Multiple Myeloma. The patient's last visit with [name] was on 03/15/2019. The pt reports that he is doing well overall.  The pt reports that he has been having increasing pain in his upper right leg and into his right groin. This discomfort is worse in the morning and improves throughout the day. He has an appointment to see his PCP towards the beginning of next month. He has been seen by Cardiology and Nephrology recently and no new concerns were found. Pt was switched from two to one 81 mg Aspirin daily by his Cardiologist. He has continued using Neurontin for the neuropathy in his hands and feet. Pt has been having difficulty with his balance intermittently. He is taking Vitamin D and Vitamin C at this time.   Lab results today (05/17/19) of CBC w/diff and CMP is as follows: all values are WNL except for WBC at 3.5K, Hgb at 12.3, RDW at 17.8, Glucose at 114, Creatinine at 1.69, Total Protein at 8.4, GFR Est Af Am at 45. 05/17/2019 K/L light chains are in progress 05/17/2019 MMP is in progress   On review of systems, pt reports right hip/thigh pain, balance issues, tingling in hands/feet and denies back pain, calf pain and any other symptoms.    REVIEW OF SYSTEMS:   A 10+ POINT REVIEW OF SYSTEMS WAS OBTAINED including neurology, dermatology, psychiatry, cardiac, respiratory, lymph, extremities, GI, GU, Musculoskeletal, constitutional, breasts, reproductive, HEENT.  All pertinent positives are noted in the HPI.  All others are negative.   Past Medical History:  Diagnosis Date  . Abdominal fullness 02/26/2016   Last Assessment &  Plan:  overall improving with no pain, no further nausea and normal BM today.  No signs of obstruction or infection today.  Due for 10 year colonoscopy, mild anemia in December, will refer to GI  . Acute renal failure ([place]) 03/06/2016  . Acute renal failure superimposed on chronic kidney disease ([place]) 03/01/2016  . AKI (acute kidney injury) ([place])   . Arterial insufficiency of lower extremity ([place]) 09/01/2016   Overview:  Mild (2018)  Last Assessment & Plan:  Mild arterial insufficiency, he does not have classic symptoms of claudication. Advised daily foot checks and would consider further evaluation if new or worsening symptoms  Mild (2018)  Last Assessment & Plan:  asymptomatic.  We will follow-up as needed Formatting of this note might be different from the original. Mild (2018)  Last Assessment & Pla  . CAD (coronary artery disease) 2006   stent LAD 3.0x18 [place], occluded RCA  . Cancer ([place])    multiple myeloma  . Cervical lymphadenopathy 03/31/2016  . Colitis 05/12/2016  . Coronary artery disease 07/21/2011   stent LAD 3.0x18 [place], occluded RCA  Overview:  Stent 2006 at [facility], Low risk stress test feb 2017  Last Assessment & Plan:  Declined further stress testing in 2015 due to cost and absence of symptoms.  Continues to be asymptomatic.  He has upcoming follow-up with cardiology for review. Encouraged to keep appt  Stent 2006 at [facility], Low risk s  . Diabetes mellitus without complication ([place])   .  DNR (do not resuscitate) discussion   . Enlarged prostate 03/02/2016  . Enteritis due to Clostridium difficile   . Epidermoid cyst of skin 03/31/2016  . Generalized abdominal pain   . H/O non-insulin dependent diabetes mellitus   . Hyperlipidemia   . Hypertension   . Infected tooth 03/31/2016  . Leukocytosis   . Lipoma 07/19/2014   Overview:  Right scalp.  Seen by Payton Mccallum in past  Last Assessment & Plan:  Lipoma on right scalp getting larger, will  refer to ENT, but more concerned about lymph node  . Lymphadenopathy 11/21/2015   Last Assessment & Plan:  Right submandibular lymph node, non-tender, now larger. CBC 2 months ago showed mild anemia, normal peripheral smear.  Will recheck today along with ESR, sed rate.  Will refer to ENT for further evaluation.    Last Assessment & Plan:   Soft, right-sided postauricular mass, deferred intervention in 2018 at onset of myeloma diagnosis, gradually enlarging over time.  Had cons  . Monoclonal gammopathy   . Multiple myeloma ([place]) 03/20/2016   Last Assessment & Plan:  Given dx, rec social distancing, avoiding in person jury duty.  Letter provided Formatting of this note might be different from the original. Last Assessment & Plan:  Myeloma treatment is going well, he has upcoming oncology visit  . Palliative care by specialist   . Pancreatitis 03/02/2016  . Peripheral neuropathy 07/22/2016   Last Assessment & Plan:  Overall improved. Patient has discontinued gabapentin, did not feel much difference  Feet and fingers.  Multifactorial, DM, Chemo, MM  Last Assessment & Plan:   Doing well on gabapentin Formatting of this note might be different from the original. Last Assessment & Plan:  Overall improved. Patient has discontinued gabapentin, did not feel much difference  . Physical deconditioning 06/13/2016   Last Assessment & Plan:  Marked weakness.  Will request further home assistance per home health with bathing.  Discharged on a supplement- patient reports is unaffordable.  Advised prefer food as no barriers, appetite reasonable, ok to use glucerna or carnation instant breakfast if desired.  . Pre-operative clearance 01/30/2017  . Protein-calorie malnutrition, severe 03/03/2016  . Sinus tachycardia 05/13/2016  . SIRS (systemic inflammatory response syndrome) ([place]) 05/12/2016  . Stage 3 chronic kidney disease 06/13/2016   Last Assessment & Plan:  Will check Cr/K today  June 2020, annual follow-up [place] [facility]   Last Assessment & Plan:   Recent labs reviewed- stable Formatting of this note might be different from the original. Last Assessment & Plan:  Will check Cr/K today  . Type 2 diabetes mellitus ([place]) 07/21/2011   Last Assessment & Plan:   Diabetes improved with fasting glucoses in a safe range.  Continue current regimen. Formatting of this note might be different from the original. Last Assessment & Plan:  Patient's diabetes has been well controlled at baseline on 12 units of Lantus daily.  He reports some hyperglycemia on the first few days after dexamethasone with resolution by the end of the week. We'll  . Upper abdominal pain   . Urinary retention   . Weakness generalized     . Past Surgical History:  Procedure Laterality Date  . CARDIAC CATHETERIZATION  2006  . CORONARY ANGIOPLASTY WITH STENT PLACEMENT  2007  . IR FLUORO GUIDE CV LINE RIGHT  05/20/2016  . IR US GUIDE VASC ACCESS RIGHT  05/20/2016    . Social History   Tobacco Use  . Smoking status: Never Smoker  .  Smokeless tobacco: Never Used  Substance Use Topics  . Alcohol use: No  . Drug use: No    ALLERGIES:  has No Known Allergies.  MEDICATIONS:  Current Outpatient Medications  Medication Sig Dispense Refill  . acyclovir (ZOVIRAX) 200 MG capsule Take 1 capsule by mouth twice daily 180 capsule 0  . amLODipine (NORVASC) 10 MG tablet Take 10 mg by mouth daily.    Douglas Rose aspirin 81 MG tablet Take 81 mg by mouth 2 (two) times daily. 2 tablets (162 mg) twice daily    . atorvastatin (LIPITOR) 20 MG tablet Take 1 tablet (20 mg total) by mouth daily. 90 tablet 3  . Blood Glucose Monitoring Suppl (ACCU-CHEK AVIVA PLUS) w/Device KIT by Does not apply route.    . gabapentin (NEURONTIN) 300 MG capsule Take 900 mg by mouth 2 (two) times daily.     . Insulin Glargine (LANTUS SOLOSTAR) 100 UNIT/ML Solostar Pen Inject 20 Units into the skin daily. (Patient taking differently: Inject 16 Units into the skin daily. ) 15 mL 11  . lenalidomide  (REVLIMID) 10 MG capsule Take 1 capsule (10 mg total) by mouth daily. Take for 21 days then 7 days off. Take with water. 21 capsule 0  . lisinopril (PRINIVIL,ZESTRIL) 2.5 MG tablet Take 2.5 mg by mouth daily.    . metoprolol tartrate (LOPRESSOR) 100 MG tablet Take 100 mg by mouth daily.     . nitroGLYCERIN (NITROSTAT) 0.4 MG SL tablet Place 1 tablet (0.4 mg total) under the tongue every 5 (five) minutes x 3 doses as needed for chest pain. 30 tablet 12  . RELION PEN NEEDLES 32G X 4 MM MISC     . vitamin B-12 (CYANOCOBALAMIN) 1000 MCG tablet Take 1,000 mcg by mouth daily.     No current facility-administered medications for this visit.    PHYSICAL EXAMINATION: ECOG PERFORMANCE STATUS: 1 - Symptomatic but completely ambulatory  Vitals:   05/17/19 1058  BP: (!) 149/76  Pulse: 64  Resp: 18  Temp: 98.5 F (36.9 C)  SpO2: 100%    Filed Weights   05/17/19 1058  Weight: 176 lb 8 oz (80.1 kg)   .Body mass index is 24.62 kg/m.   GENERAL:alert, in no acute distress and comfortable SKIN: no acute rashes, no significant lesions EYES: conjunctiva are pink and non-injected, sclera anicteric OROPHARYNX: MMM, no exudates, no oropharyngeal erythema or ulceration NECK: supple, no JVD LYMPH:  no palpable lymphadenopathy in the cervical, axillary or inguinal regions LUNGS: clear to auscultation b/l with normal respiratory effort HEART: regular rate & rhythm ABDOMEN:  normoactive bowel sounds , non tender, not distended. No palpable hepatosplenomegaly.  Extremity: no pedal edema, tenderness to palpation over the right thigh PSYCH: alert & oriented x 3 with fluent speech NEURO: no focal motor/sensory deficits  LABORATORY DATA:   I have reviewed the data as listed  06/08/2018 MMP Component     Latest Ref Rng & Units 06/08/2018  IgG (Immunoglobin G), Serum     603 - 1,613 mg/dL 1,556  IgA     61 - 437 mg/dL 303  IgM (Immunoglobulin M), Srm     15 - 143 mg/dL 36  Total Protein ELP     6.0  - 8.5 g/dL 6.5  Albumin SerPl Elph-Mcnc     2.9 - 4.4 g/dL 3.2  Alpha 1     0.0 - 0.4 g/dL 0.2  Alpha2 Glob SerPl Elph-Mcnc     0.4 - 1.0 g/dL 0.8  B-Globulin SerPl  Elph-Mcnc     0.7 - 1.3 g/dL 0.9  Gamma Glob SerPl Elph-Mcnc     0.4 - 1.8 g/dL 1.5  M Protein SerPl Elph-Mcnc     Not Observed g/dL Not Observed  Globulin, Total     2.2 - 3.9 g/dL 3.3  Albumin/Glob SerPl     0.7 - 1.7 1.0  IFE 1      Comment  Please Note (HCV):      Comment   . CBC Latest Ref Rng & Units 05/17/2019 03/01/2019 12/15/2018  WBC 4.0 - 10.5 K/uL 3.5(L) 2.8(L) 3.1(L)  Hemoglobin 13.0 - 17.0 g/dL 12.3(L) 11.6(L) 11.4(L)  Hematocrit 39.0 - 52.0 % 39.5 36.5(L) 36.0(L)  Platelets 150 - 400 K/uL 192 186 164  ANC 1k  . CMP Latest Ref Rng & Units 05/17/2019 03/01/2019 12/15/2018  Glucose 70 - 99 mg/dL 114(H) 131(H) 201(H)  BUN 8 - 23 mg/dL 23 16 20   Creatinine 0.61 - 1.24 mg/dL 1.69(H) 1.46(H) 1.61(H)  Sodium 135 - 145 mmol/L 139 140 139  Potassium 3.5 - 5.1 mmol/L 3.8 3.9 3.8  Chloride 98 - 111 mmol/L 104 108 104  CO2 22 - 32 mmol/L 25 25 28   Calcium 8.9 - 10.3 mg/dL 9.1 8.9 8.4(L)  Total Protein 6.5 - 8.1 g/dL 8.4(H) 7.5 7.2  Total Bilirubin 0.3 - 1.2 mg/dL 0.5 0.4 0.3  Alkaline Phos 38 - 126 U/L 93 81 89  AST 15 - 41 U/L 16 12(L) 14(L)  ALT 0 - 44 U/L 22 16 20        RADIOGRAPHIC STUDIES: I have personally reviewed the radiological images as listed and agreed with the findings in the report. No results found.  ASSESSMENT & PLAN:   74 y.o.  with multiple medical comorbidities but fairly good performance status overall with   1) ISS Stage II IgG Kapppa multiple myeloma. Anemia + Renal insufficiency.  Bone survey neg for concerning bone lesions. significantly elevated kappafree light chains 858, lambda 18.4, with a ratio of 46.65 . He was also noted to have an M spike of 1.7 g/dL with IFE showing IgG kappa monoclonal protein. Quantitative IgG level was increased to nearly 2500 mg/dL. 24-hour  UPEP showed monoclonal protein of 1300 mg per 24 hours which constituted about 86% of all the urinary protein. Bone marrow biopsy showed 20-30% Restricted plasma cells consistent with multiple myeloma Cytogenetics/FISH  Showed +4, +11, and +17   Patient has been treated with CyBord X 2 cycles . Treatment interrupted due to severe c diff colitis with prolonged hospitalization and decline in performance status. Patient is much improved and back to baseline now. M protein undetected with neg IFE Has been treated with 5 cycles of Rd  Currently on maintenance Rituxan and in Remission.  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 Had an element of obstructive uropathy due to BPH which has resolved. Creatinine continues to be stable.  3) Normocytic anemia likely related to multiple myeloma.-stable, improved. hgb 11.5 No abdominal pain or discomfort over the right upper quadrant. No fevers or chills.  PLAN:   -Discussed pt labwork today, 05/17/19; all values are WNL except for WBC at 3.5K, Hgb at 12.3, RDW at 17.8, Glucose at 114, Creatinine at 1.69, Total Protein at 8.4, GFR Est Af Am at 45. -Discussed 05/17/2019 MMP is in progress  -Discussed 05/17/2019 K/L light chains are in progress -Based on last labs pt is still in laboratory and clinical remission of Multiple Myeloma  -  The pt has no prohibitive toxicities from continuing maintenance 68m Revlimid, three weeks on and one week off, at this time. -Advised pt that after two years of maintenance we could consider holding treatment and watching with labs and scans.  -Advised pt that Gabapentin has been known to cause balance issues. Recommend pt discuss medication timing with PCP. May benefit from taking larger doses closer to bedtime.  -Recommend pt discuss referral to Orthopedist with PCP  -Continue 81 mg Aspirin daily  -Will get labs today  -Will get Whole Body bone scan in 1 week  -Will see back  in 2 months, with labs 1 week prior   4) 5) DM2 - controlled -continue f/u with PCP.    FOLLOW UP: Labs today Whole body bone scan in 1 week RTC with Dr KIrene Limbowith labs in 2 months. Plz schedule labs 1 week prior to clinic visit.    The total time spent in the appt was 20 minutes and more than 50% was on counseling and direct patient cares.  All of the patient's questions were answered with apparent satisfaction. The patient knows to call the clinic with any problems, questions or concerns.   GSullivan LoneMD MYaphankAAHIVMS [facility] CenterCM[facility] CosHematology/Oncology Physician [facility] (Office):       [phone](Work cell):  [phone](Fax):           [phone] I, JYevette Edwards am acting as a scribe for Dr. GSullivan Lone   .I have reviewed the above documentation for accuracy and completeness, and I agree with the above. .Brunetta GeneraMD    ADDENDUM  Component     Latest Ref Rng & Units 05/17/2019  WBC     4.0 - 10.5 K/uL 3.5 (L)  RBC     4.22 - 5.81 MIL/uL 4.47  Hemoglobin     13.0 - 17.0 g/dL 12.3 (L)  HCT     39.0 - 52.0 % 39.5  MCV     80.0 - 100.0 fL 88.4  MCH     26.0 - 34.0 pg 27.5  MCHC     30.0 - 36.0 g/dL 31.1  RDW     11.5 - 15.5 % 17.8 (H)  Platelets     150 - 400 K/uL 192  nRBC     0.0 - 0.2 % 0.0  Neutrophils     % 55  NEUT#     1.7 - 7.7 K/uL 1.9  Lymphocytes     % 24  Lymphocyte #     0.7 - 4.0 K/uL 0.8  Monocytes Relative     % 15  Monocyte #     0.1 - 1.0 K/uL 0.5  Eosinophil     % 5  Eosinophils Absolute     0.0 - 0.5 K/uL 0.2  Basophil     % 0  Basophils Absolute     0.0 - 0.1 K/uL 0.0  Immature Granulocytes     % 1  Abs Immature Granulocytes     0.00 - 0.07 K/uL 0.02  Sodium     135 - 145 mmol/L 139  Potassium     3.5 - 5.1 mmol/L 3.8  Chloride     98 - 111 mmol/L 104  CO2     22 - 32 mmol/L 25  Glucose     70 - 99 mg/dL 114 (H)  BUN     8 - 23 mg/dL 23  Creatinine  0.61 - 1.24 mg/dL  1.69 (H)  Calcium     8.9 - 10.3 mg/dL 9.1  Total Protein     6.5 - 8.1 g/dL 8.4 (H)  Albumin     3.5 - 5.0 g/dL 3.7  AST     15 - 41 U/L 16  ALT     0 - 44 U/L 22  Alkaline Phosphatase     38 - 126 U/L 93  Total Bilirubin     0.3 - 1.2 mg/dL 0.5  GFR, Est Non African American     >60 mL/min 39 (L)  GFR, Est African American     >60 mL/min 45 (L)  Anion gap     5 - 15 10  IgG (Immunoglobin G), Serum     603 - 1,613 mg/dL 1,879 (H)  IgA     61 - 437 mg/dL 391  IgM (Immunoglobulin M), Srm     15 - 143 mg/dL 35  Total Protein ELP     6.0 - 8.5 g/dL 7.5  Albumin SerPl Elph-Mcnc     2.9 - 4.4 g/dL 3.7  Alpha 1     0.0 - 0.4 g/dL 0.2  Alpha2 Glob SerPl Elph-Mcnc     0.4 - 1.0 g/dL 0.8  B-Globulin SerPl Elph-Mcnc     0.7 - 1.3 g/dL 1.1  Gamma Glob SerPl Elph-Mcnc     0.4 - 1.8 g/dL 1.7  M Protein SerPl Elph-Mcnc     Not Observed g/dL Not Observed  Globulin, Total     2.2 - 3.9 g/dL 3.8  Albumin/Glob SerPl     0.7 - 1.7 1.0  IFE 1      Comment (A)  Please Note (HCV):      Comment  Kappa free light chain     3.3 - 19.4 mg/L 64.6 (H)  Lamda free light chains     5.7 - 26.3 mg/L 39.6 (H)  Kappa, lamda light chain ratio     0.26 - 1.65 1.63

## 2019-05-18 LAB — KAPPA/LAMBDA LIGHT CHAINS
Kappa free light chain: 64.6 mg/L — ABNORMAL HIGH (ref 3.3–19.4)
Kappa, lambda light chain ratio: 1.63 (ref 0.26–1.65)
Lambda free light chains: 39.6 mg/L — ABNORMAL HIGH (ref 5.7–26.3)

## 2019-05-19 LAB — MULTIPLE MYELOMA PANEL, SERUM
Albumin SerPl Elph-Mcnc: 3.7 g/dL (ref 2.9–4.4)
Albumin/Glob SerPl: 1 (ref 0.7–1.7)
Alpha 1: 0.2 g/dL (ref 0.0–0.4)
Alpha2 Glob SerPl Elph-Mcnc: 0.8 g/dL (ref 0.4–1.0)
B-Globulin SerPl Elph-Mcnc: 1.1 g/dL (ref 0.7–1.3)
Gamma Glob SerPl Elph-Mcnc: 1.7 g/dL (ref 0.4–1.8)
Globulin, Total: 3.8 g/dL (ref 2.2–3.9)
IgA: 391 mg/dL (ref 61–437)
IgG (Immunoglobin G), Serum: 1879 mg/dL — ABNORMAL HIGH (ref 603–1613)
IgM (Immunoglobulin M), Srm: 35 mg/dL (ref 15–143)
Total Protein ELP: 7.5 g/dL (ref 6.0–8.5)

## 2019-05-25 ENCOUNTER — Ambulatory Visit (HOSPITAL_COMMUNITY)
Admission: RE | Admit: 2019-05-25 | Discharge: 2019-05-25 | Disposition: A | Discharge status: Home / Self Care | Payer: Medicare HMO | Source: Ambulatory Visit | Attending: Hematology | Admitting: Hematology

## 2019-05-25 ENCOUNTER — Encounter (HOSPITAL_COMMUNITY)
Admission: RE | Admit: 2019-05-25 | Discharge: 2019-05-25 | Disposition: A | Discharge status: Home / Self Care | Payer: Medicare HMO | Source: Ambulatory Visit | Attending: Hematology | Admitting: Hematology

## 2019-05-25 ENCOUNTER — Other Ambulatory Visit: Payer: Self-pay

## 2019-05-25 DIAGNOSIS — D702 Other drug-induced agranulocytosis: Secondary | ICD-10-CM | POA: Insufficient documentation

## 2019-05-25 DIAGNOSIS — C9001 Multiple myeloma in remission: Secondary | ICD-10-CM | POA: Insufficient documentation

## 2019-05-25 MED ORDER — TECHNETIUM TC 99M MEDRONATE IV KIT
19.6000 | PACK | Freq: Once | INTRAVENOUS | Status: AC
  Administered 2019-05-25: 19.6 via INTRAVENOUS

## 2019-06-06 ENCOUNTER — Other Ambulatory Visit: Payer: Self-pay | Admitting: Hematology

## 2019-06-06 DIAGNOSIS — C9000 Multiple myeloma not having achieved remission: Secondary | ICD-10-CM

## 2019-06-07 ENCOUNTER — Other Ambulatory Visit: Payer: Self-pay

## 2019-06-07 DIAGNOSIS — C9000 Multiple myeloma not having achieved remission: Secondary | ICD-10-CM

## 2019-06-07 MED ORDER — LENALIDOMIDE 10 MG PO CAPS: 10.0000 mg | ORAL_CAPSULE | Freq: Every day | ORAL | 0 refills | Status: DC

## 2019-06-07 NOTE — Telephone Encounter (Signed)
Completed prescriber survey and sent to [facility] pharmacy for refill: [employer] Auth # [id number], 06/07/19

## 2019-07-04 ENCOUNTER — Other Ambulatory Visit: Payer: Self-pay | Admitting: Hematology

## 2019-07-04 ENCOUNTER — Other Ambulatory Visit: Payer: Self-pay

## 2019-07-04 DIAGNOSIS — C9000 Multiple myeloma not having achieved remission: Secondary | ICD-10-CM

## 2019-07-04 MED ORDER — LENALIDOMIDE 10 MG PO CAPS: 10.0000 mg | ORAL_CAPSULE | Freq: Every day | ORAL | 0 refills | Status: DC

## 2019-07-04 NOTE — Telephone Encounter (Signed)
Refilled: Douglas Rose # [id number]   Date 07/04/19

## 2019-07-08 ENCOUNTER — Telehealth: Payer: Self-pay | Admitting: *Deleted

## 2019-07-08 DIAGNOSIS — C9000 Multiple myeloma not having achieved remission: Secondary | ICD-10-CM

## 2019-07-08 MED ORDER — LENALIDOMIDE 10 MG PO CAPS: 10.0000 mg | ORAL_CAPSULE | Freq: Every day | ORAL | 0 refills | Status: DC

## 2019-07-08 NOTE — Telephone Encounter (Signed)
Patient contacted office - stated he needed refill of Revlimid and that pharmacy would not release it unless MD. [facility] - they never received RX.  Hilda Blades, rep w/[facility] - requested RX be faxed to [facility] @ [phone] - no cover required.  RX reprinted (w/[employer] auth number from 07/04/19 and faxed to number above - fax confirmation received  Contacted patient with this information - he verbalized understanding

## 2019-07-12 ENCOUNTER — Inpatient Hospital Stay: Payer: Medicare HMO | Attending: Hematology

## 2019-07-12 ENCOUNTER — Other Ambulatory Visit: Payer: Self-pay

## 2019-07-12 DIAGNOSIS — C9000 Multiple myeloma not having achieved remission: Secondary | ICD-10-CM | POA: Insufficient documentation

## 2019-07-12 DIAGNOSIS — D702 Other drug-induced agranulocytosis: Secondary | ICD-10-CM

## 2019-07-12 DIAGNOSIS — C9001 Multiple myeloma in remission: Secondary | ICD-10-CM

## 2019-07-12 LAB — CBC WITH DIFFERENTIAL/PLATELET
Abs Immature Granulocytes: 0.01 10*3/uL (ref 0.00–0.07)
Basophils Absolute: 0 10*3/uL (ref 0.0–0.1)
Basophils Relative: 0 %
Eosinophils Absolute: 0.2 10*3/uL (ref 0.0–0.5)
Eosinophils Relative: 6 %
HCT: 38.1 % — ABNORMAL LOW (ref 39.0–52.0)
Hemoglobin: 12 g/dL — ABNORMAL LOW (ref 13.0–17.0)
Immature Granulocytes: 0 %
Lymphocytes Relative: 29 %
Lymphs Abs: 1.1 10*3/uL (ref 0.7–4.0)
MCH: 27.7 pg (ref 26.0–34.0)
MCHC: 31.5 g/dL (ref 30.0–36.0)
MCV: 88 fL (ref 80.0–100.0)
Monocytes Absolute: 0.4 10*3/uL (ref 0.1–1.0)
Monocytes Relative: 10 %
Neutro Abs: 2 10*3/uL (ref 1.7–7.7)
Neutrophils Relative %: 55 %
Platelets: 188 10*3/uL (ref 150–400)
RBC: 4.33 MIL/uL (ref 4.22–5.81)
RDW: 18 % — ABNORMAL HIGH (ref 11.5–15.5)
WBC: 3.7 10*3/uL — ABNORMAL LOW (ref 4.0–10.5)
nRBC: 0 % (ref 0.0–0.2)

## 2019-07-12 LAB — CMP (CANCER CENTER ONLY)
ALT: 17 U/L (ref 0–44)
AST: 14 U/L — ABNORMAL LOW (ref 15–41)
Albumin: 3.5 g/dL (ref 3.5–5.0)
Alkaline Phosphatase: 98 U/L (ref 38–126)
Anion gap: 7 (ref 5–15)
BUN: 14 mg/dL (ref 8–23)
CO2: 25 mmol/L (ref 22–32)
Calcium: 9.1 mg/dL (ref 8.9–10.3)
Chloride: 107 mmol/L (ref 98–111)
Creatinine: 1.66 mg/dL — ABNORMAL HIGH (ref 0.61–1.24)
GFR, Est AFR Am: 46 mL/min — ABNORMAL LOW (ref >60)
GFR, Estimated: 40 mL/min — ABNORMAL LOW (ref >60)
Glucose, Bld: 146 mg/dL — ABNORMAL HIGH (ref 70–99)
Potassium: 3.6 mmol/L (ref 3.5–5.1)
Sodium: 139 mmol/L (ref 135–145)
Total Bilirubin: 0.4 mg/dL (ref 0.3–1.2)
Total Protein: 8 g/dL (ref 6.5–8.1)

## 2019-07-13 LAB — KAPPA/LAMBDA LIGHT CHAINS
Kappa free light chain: 67.7 mg/L — ABNORMAL HIGH (ref 3.3–19.4)
Kappa, lambda light chain ratio: 1.74 — ABNORMAL HIGH (ref 0.26–1.65)
Lambda free light chains: 38.8 mg/L — ABNORMAL HIGH (ref 5.7–26.3)

## 2019-07-14 LAB — MULTIPLE MYELOMA PANEL, SERUM
Albumin SerPl Elph-Mcnc: 3.5 g/dL (ref 2.9–4.4)
Albumin/Glob SerPl: 1 (ref 0.7–1.7)
Alpha 1: 0.2 g/dL (ref 0.0–0.4)
Alpha2 Glob SerPl Elph-Mcnc: 0.7 g/dL (ref 0.4–1.0)
B-Globulin SerPl Elph-Mcnc: 0.9 g/dL (ref 0.7–1.3)
Gamma Glob SerPl Elph-Mcnc: 1.7 g/dL (ref 0.4–1.8)
Globulin, Total: 3.6 g/dL (ref 2.2–3.9)
IgA: 400 mg/dL (ref 61–437)
IgG (Immunoglobin G), Serum: 1638 mg/dL — ABNORMAL HIGH (ref 603–1613)
IgM (Immunoglobulin M), Srm: 35 mg/dL (ref 15–143)
Total Protein ELP: 7.1 g/dL (ref 6.0–8.5)

## 2019-07-15 ENCOUNTER — Telehealth: Payer: Self-pay | Admitting: *Deleted

## 2019-07-15 NOTE — Telephone Encounter (Signed)
Patient called - Please cancel appt on 7/19. Needs to reschedule. Confirmed with patient's wife - Message sent to scheduling. She verbalized understanding

## 2019-07-18 ENCOUNTER — Other Ambulatory Visit: Payer: Medicare HMO

## 2019-07-18 ENCOUNTER — Ambulatory Visit: Payer: Medicare HMO | Admitting: Hematology

## 2019-07-22 ENCOUNTER — Other Ambulatory Visit: Payer: Self-pay

## 2019-07-22 DIAGNOSIS — C9000 Multiple myeloma not having achieved remission: Secondary | ICD-10-CM

## 2019-08-08 ENCOUNTER — Inpatient Hospital Stay: Payer: Medicare HMO | Attending: Hematology

## 2019-08-08 ENCOUNTER — Other Ambulatory Visit: Payer: Self-pay

## 2019-08-08 ENCOUNTER — Other Ambulatory Visit: Payer: Self-pay | Admitting: Hematology

## 2019-08-08 ENCOUNTER — Inpatient Hospital Stay: Payer: Medicare HMO | Admitting: Hematology

## 2019-08-08 VITALS — BP 141/80 | HR 65 | Temp 97.5°F | Resp 17 | Ht 71.0 in | Wt 173.5 lb

## 2019-08-08 DIAGNOSIS — C9000 Multiple myeloma not having achieved remission: Secondary | ICD-10-CM | POA: Diagnosis present

## 2019-08-08 LAB — CMP (CANCER CENTER ONLY)
ALT: 16 U/L (ref 0–44)
AST: 12 U/L — ABNORMAL LOW (ref 15–41)
Albumin: 3.2 g/dL — ABNORMAL LOW (ref 3.5–5.0)
Alkaline Phosphatase: 77 U/L (ref 38–126)
Anion gap: 6 (ref 5–15)
BUN: 21 mg/dL (ref 8–23)
CO2: 26 mmol/L (ref 22–32)
Calcium: 9 mg/dL (ref 8.9–10.3)
Chloride: 105 mmol/L (ref 98–111)
Creatinine: 1.56 mg/dL — ABNORMAL HIGH (ref 0.61–1.24)
GFR, Est AFR Am: 50 mL/min — ABNORMAL LOW (ref >60)
GFR, Estimated: 43 mL/min — ABNORMAL LOW (ref >60)
Glucose, Bld: 98 mg/dL (ref 70–99)
Potassium: 3.5 mmol/L (ref 3.5–5.1)
Sodium: 137 mmol/L (ref 135–145)
Total Bilirubin: 0.4 mg/dL (ref 0.3–1.2)
Total Protein: 7 g/dL (ref 6.5–8.1)

## 2019-08-08 LAB — CBC WITH DIFFERENTIAL/PLATELET
Abs Immature Granulocytes: 0.01 10*3/uL (ref 0.00–0.07)
Basophils Absolute: 0 10*3/uL (ref 0.0–0.1)
Basophils Relative: 0 %
Eosinophils Absolute: 0.1 10*3/uL (ref 0.0–0.5)
Eosinophils Relative: 6 %
HCT: 33 % — ABNORMAL LOW (ref 39.0–52.0)
Hemoglobin: 10.6 g/dL — ABNORMAL LOW (ref 13.0–17.0)
Immature Granulocytes: 0 %
Lymphocytes Relative: 31 %
Lymphs Abs: 0.8 10*3/uL (ref 0.7–4.0)
MCH: 28.2 pg (ref 26.0–34.0)
MCHC: 32.1 g/dL (ref 30.0–36.0)
MCV: 87.8 fL (ref 80.0–100.0)
Monocytes Absolute: 0.4 10*3/uL (ref 0.1–1.0)
Monocytes Relative: 14 %
Neutro Abs: 1.3 10*3/uL — ABNORMAL LOW (ref 1.7–7.7)
Neutrophils Relative %: 49 %
Platelets: 152 10*3/uL (ref 150–400)
RBC: 3.76 MIL/uL — ABNORMAL LOW (ref 4.22–5.81)
RDW: 18.4 % — ABNORMAL HIGH (ref 11.5–15.5)
WBC: 2.6 10*3/uL — ABNORMAL LOW (ref 4.0–10.5)
nRBC: 0 % (ref 0.0–0.2)

## 2019-08-08 NOTE — Progress Notes (Signed)
[name]  HEMATOLOGY ONCOLOGY PROGRESS NOTE  Date of service: 08/08/19  Patient Care Team: [name], MD as PCP - General (Family Medicine)  CC: f/u for multiple myeloma  Diagnosis:  IgG Kappa multiple myeloma   Current Treatment:  Revlimid maintenance   INTERVAL HISTORY: Douglas Rose presents today for management and evaluation of his maintenance Revlimid for his Multiple Myeloma. The patient's last visit with [name] was on 05/17/2019. The pt reports that he is doing well overall.  The pt reports that he saw his PCP after the bone scan and they feel that his right leg pain is due to arthritis in his knee and hip joint. Pt has not spoken to an Orthopedist yet. The pain occurs when pt puts weight on his leg and is improved with a warm pad. He is currently using a walking stick. He has continued taking a baby Aspirin daily.   Of note since the patient's last visit, pt has had Bone Scan ([id number]) completed on 05/25/2019 with results revealing "Normal nuclear medicine whole-body bone scan."  Lab results (08/08/19) of CBC w/diff and CMP is as follows: all values are WNL except for WBC at 2.6K, RBC at 3.76, Hgb at 10.6, HCT at 33.0, RDW at 18.4, Neutro Abs at 1.3K, Ovalocytes are "Present", Creatinine at 1.56, Albumin at 3.2, AST at 12, GFR Est Af Am at 50. 07/12/2019 K/L light chains is as follows: Kappa free light chain at 67.7, Lamda free light chains at 38.8, K/L light chain ratio at 1.74 07/12/2019 MMP is as follows: all values are WNL except for IgG at 1638, IFE1: Polyclonal increase in one or more immunoglobulins  On review of systems, pt reports right leg pain and denies diarrhea, nausea, vomiting, new bone pain, mouth sores, back pain, abdominal pain, leg swelling and any other symptoms.    REVIEW OF SYSTEMS:   A 10+ POINT REVIEW OF SYSTEMS WAS OBTAINED including neurology, dermatology, psychiatry, cardiac, respiratory, lymph, extremities, GI, GU, Musculoskeletal, constitutional,  breasts, reproductive, HEENT.  All pertinent positives are noted in the HPI.  All others are negative.   Past Medical History:  Diagnosis Date  . Abdominal fullness 02/26/2016   Last Assessment & Plan:  overall improving with no pain, no further nausea and normal BM today.  No signs of obstruction or infection today.  Due for 10 year colonoscopy, mild anemia in December, will refer to GI  . Acute renal failure ([place]) 03/06/2016  . Acute renal failure superimposed on chronic kidney disease ([place]) 03/01/2016  . AKI (acute kidney injury) ([place])   . Arterial insufficiency of lower extremity ([place]) 09/01/2016   Overview:  Mild (2018)  Last Assessment & Plan:  Mild arterial insufficiency, he does not have classic symptoms of claudication. Advised daily foot checks and would consider further evaluation if new or worsening symptoms  Mild (2018)  Last Assessment & Plan:  asymptomatic.  We will follow-up as needed Formatting of this note might be different from the original. Mild (2018)  Last Assessment & Pla  . CAD (coronary artery disease) 2006   stent LAD 3.0x18 [place], occluded RCA  . Cancer ([place])    multiple myeloma  . Cervical lymphadenopathy 03/31/2016  . Colitis 05/12/2016  . Coronary artery disease 07/21/2011   stent LAD 3.0x18 [place]her, occluded RCA  Overview:  Stent 2006 at [facility], Low risk stress test feb 2017  Last Assessment & Plan:  Declined further stress testing in 2015 due to cost and absence of  symptoms.  Continues to be asymptomatic.  He has upcoming follow-up with cardiology for review. Encouraged to keep appt  Stent 2006 at [facility], Low risk s  . Diabetes mellitus without complication ([place])   . DNR (do not resuscitate) discussion   . Enlarged prostate 03/02/2016  . Enteritis due to Clostridium difficile   . Epidermoid cyst of skin 03/31/2016  . Generalized abdominal pain   . H/O non-insulin dependent diabetes mellitus   . Hyperlipidemia   .  Hypertension   . Infected tooth 03/31/2016  . Leukocytosis   . Lipoma 07/19/2014   Overview:  Right scalp.  Seen by Payton Mccallum in past  Last Assessment & Plan:  Lipoma on right scalp getting larger, will refer to ENT, but more concerned about lymph node  . Lymphadenopathy 11/21/2015   Last Assessment & Plan:  Right submandibular lymph node, non-tender, now larger. CBC 2 months ago showed mild anemia, normal peripheral smear.  Will recheck today along with ESR, sed rate.  Will refer to ENT for further evaluation.    Last Assessment & Plan:   Soft, right-sided postauricular mass, deferred intervention in 2018 at onset of myeloma diagnosis, gradually enlarging over time.  Had cons  . Monoclonal gammopathy   . Multiple myeloma ([place]) 03/20/2016   Last Assessment & Plan:  Given dx, rec social distancing, avoiding in person jury duty.  Letter provided Formatting of this note might be different from the original. Last Assessment & Plan:  Myeloma treatment is going well, he has upcoming oncology visit  . Palliative care by specialist   . Pancreatitis 03/02/2016  . Peripheral neuropathy 07/22/2016   Last Assessment & Plan:  Overall improved. Patient has discontinued gabapentin, did not feel much difference  Feet and fingers.  Multifactorial, DM, Chemo, MM  Last Assessment & Plan:   Doing well on gabapentin Formatting of this note might be different from the original. Last Assessment & Plan:  Overall improved. Patient has discontinued gabapentin, did not feel much difference  . Physical deconditioning 06/13/2016   Last Assessment & Plan:  Marked weakness.  Will request further home assistance per home health with bathing.  Discharged on a supplement- patient reports is unaffordable.  Advised prefer food as no barriers, appetite reasonable, ok to use glucerna or carnation instant breakfast if desired.  . Pre-operative clearance 01/30/2017  . Protein-calorie malnutrition, severe 03/03/2016  . Sinus tachycardia 05/13/2016  .  SIRS (systemic inflammatory response syndrome) ([place]) 05/12/2016  . Stage 3 chronic kidney disease 06/13/2016   Last Assessment & Plan:  Will check Cr/K today  June 2020, annual follow-up [place] kidney  Last Assessment & Plan:   Recent labs reviewed- stable Formatting of this note might be different from the original. Last Assessment & Plan:  Will check Cr/K today  . Type 2 diabetes mellitus ([place]) 07/21/2011   Last Assessment & Plan:   Diabetes improved with fasting glucoses in a safe range.  Continue current regimen. Formatting of this note might be different from the original. Last Assessment & Plan:  Patient's diabetes has been well controlled at baseline on 12 units of Lantus daily.  He reports some hyperglycemia on the first few days after dexamethasone with resolution by the end of the week. We'll  . Upper abdominal pain   . Urinary retention   . Weakness generalized     . Past Surgical History:  Procedure Laterality Date  . CARDIAC CATHETERIZATION  2006  . CORONARY ANGIOPLASTY WITH STENT PLACEMENT  2007  . IR FLUORO GUIDE CV LINE RIGHT  05/20/2016  . IR US GUIDE VASC ACCESS RIGHT  05/20/2016    . Social History   Tobacco Use  . Smoking status: Never Smoker  . Smokeless tobacco: Never Used  Vaping Use  . Vaping Use: Never used  Substance Use Topics  . Alcohol use: No  . Drug use: No    ALLERGIES:  has No Known Allergies.  MEDICATIONS:  Current Outpatient Medications  Medication Sig Dispense Refill  . acyclovir (ZOVIRAX) 200 MG capsule Take 1 capsule by mouth twice daily 180 capsule 0  . amLODipine (NORVASC) 10 MG tablet Take 10 mg by mouth daily.    [name] aspirin 81 MG tablet Take 81 mg by mouth 2 (two) times daily. 2 tablets (162 mg) twice daily    . atorvastatin (LIPITOR) 20 MG tablet Take 1 tablet (20 mg total) by mouth daily. 90 tablet 3  . Blood Glucose Monitoring Suppl (ACCU-CHEK AVIVA PLUS) w/Device KIT by Does not apply route.    . gabapentin (NEURONTIN) 300 MG capsule  Take 900 mg by mouth 2 (two) times daily.     . Insulin Glargine (LANTUS SOLOSTAR) 100 UNIT/ML Solostar Pen Inject 20 Units into the skin daily. (Patient taking differently: Inject 16 Units into the skin daily. ) 15 mL 11  . lenalidomide (REVLIMID) 10 MG capsule Take 1 capsule (10 mg total) by mouth daily. Adult male. Take for 21 days then 7 days off. Take with water. 21 capsule 0  . lisinopril (PRINIVIL,ZESTRIL) 2.5 MG tablet Take 2.5 mg by mouth daily.    . metoprolol tartrate (LOPRESSOR) 100 MG tablet Take 100 mg by mouth daily.     . nitroGLYCERIN (NITROSTAT) 0.4 MG SL tablet Place 1 tablet (0.4 mg total) under the tongue every 5 (five) minutes x 3 doses as needed for chest pain. 30 tablet 12  . RELION PEN NEEDLES 32G X 4 MM MISC     . vitamin B-12 (CYANOCOBALAMIN) 1000 MCG tablet Take 1,000 mcg by mouth daily.     No current facility-administered medications for this visit.    PHYSICAL EXAMINATION: ECOG PERFORMANCE STATUS: 1 - Symptomatic but completely ambulatory  Vitals:   08/08/19 1336  BP: (!) 141/80  Pulse: 65  Resp: 17  Temp: (!) 97.5 F (36.4 C)  SpO2: 100%    Filed Weights   08/08/19 1336  Weight: 173 lb 8 oz (78.7 kg)   .Body mass index is 24.2 kg/m.   Exam was given in a chair   GENERAL:alert, in no acute distress and comfortable SKIN: no acute rashes, no significant lesions EYES: conjunctiva are pink and non-injected, sclera anicteric OROPHARYNX: MMM, no exudates, no oropharyngeal erythema or ulceration NECK: supple, no JVD LYMPH:  no palpable lymphadenopathy in the cervical, axillary or inguinal regions LUNGS: clear to auscultation b/l with normal respiratory effort HEART: regular rate & rhythm ABDOMEN:  normoactive bowel sounds , non tender, not distended. No palpable hepatosplenomegaly.  Extremity: no pedal edema PSYCH: alert & oriented x 3 with fluent speech NEURO: no focal motor/sensory deficits  LABORATORY DATA:   I have reviewed the data as  listed  06/08/2018 MMP Component     Latest Ref Rng & Units 06/08/2018  IgG (Immunoglobin G), Serum     603 - 1,613 mg/dL 1,556  IgA     61 - 437 mg/dL 303  IgM (Immunoglobulin M), Srm     15 - 143 mg/dL 36  Total Protein  ELP     6.0 - 8.5 g/dL 6.5  Albumin SerPl Elph-Mcnc     2.9 - 4.4 g/dL 3.2  Alpha 1     0.0 - 0.4 g/dL 0.2  Alpha2 Glob SerPl Elph-Mcnc     0.4 - 1.0 g/dL 0.8  B-Globulin SerPl Elph-Mcnc     0.7 - 1.3 g/dL 0.9  Gamma Glob SerPl Elph-Mcnc     0.4 - 1.8 g/dL 1.5  M Protein SerPl Elph-Mcnc     Not Observed g/dL Not Observed  Globulin, Total     2.2 - 3.9 g/dL 3.3  Albumin/Glob SerPl     0.7 - 1.7 1.0  IFE 1      Comment  Please Note (HCV):      Comment   . CBC Latest Ref Rng & Units 08/08/2019 07/12/2019 05/17/2019  WBC 4.0 - 10.5 K/uL 2.6(L) 3.7(L) 3.5(L)  Hemoglobin 13.0 - 17.0 g/dL 10.6(L) 12.0(L) 12.3(L)  Hematocrit 39 - 52 % 33.0(L) 38.1(L) 39.5  Platelets 150 - 400 K/uL 152 188 192  ANC 1k  . CMP Latest Ref Rng & Units 08/08/2019 07/12/2019 05/17/2019  Glucose 70 - 99 mg/dL 98 146(H) 114(H)  BUN 8 - 23 mg/dL _0 Creatinine 0.61 - 1.24 mg/dL 1.56(H) 1.66(H) 1.69(H)  Sodium 135 - 145 mmol/L 137 139 139  Potassium 3.5 - 5.1 mmol/L 3.5 3.6 3.8  Chloride 98 - 111 mmol/L 105 107 104  CO2 22 - 32 mmol/L _1 Calcium 8.9 - 10.3 mg/dL 9.0 9.1 9.1  Total Protein 6.5 - 8.1 g/dL 7.0 8.0 8.4(H)  Total Bilirubin 0.3 - 1.2 mg/dL 0.4 0.4 0.5  Alkaline Phos 38 - 126 U/L 77 98 93  AST 15 - 41 U/L 12(L) 14(L) 16  ALT 0 - 44 U/L _2 RADIOGRAPHIC STUDIES: I have personally reviewed the radiological images as listed and agreed with the findings in the report. No results found.  ASSESSMENT & PLAN:   75 y.o.  with multiple medical comorbidities but fairly good performance status overall with   1) ISS Stage II IgG Kapppa multiple myeloma. Anemia + Renal insufficiency.  Bone survey neg for concerning bone lesions. significantly elevated  kappafree light chains 858, lambda 18.4, with a ratio of 46.65 . He was also noted to have an M spike of 1.7 g/dL with IFE showing IgG kappa monoclonal protein. Quantitative IgG level was increased to nearly 2500 mg/dL. 24-hour UPEP showed monoclonal protein of 1300 mg per 24 hours which constituted about 86% of all the urinary protein. Bone marrow biopsy showed 20-30% Restricted plasma cells consistent with multiple myeloma Cytogenetics/FISH  Showed +4, +11, and +17   Patient has been treated with CyBord X 2 cycles . Treatment interrupted due to severe c diff colitis with prolonged hospitalization and decline in performance status. Patient is much improved and back to baseline now. M protein undetected with neg IFE Has been treated with 5 cycles of Rd  Currently on maintenance Rituxan and in Remission.  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 Had an element of obstructive uropathy due to BPH which has resolved. Creatinine continues to be stable.  3) Normocytic anemia likely related to multiple myeloma.-stable, improved. hgb 11.5 No abdominal pain or discomfort over the right upper quadrant. No fevers or chills.  PLAN:  -Discussed pt labwork today, 08/08/19; mild anemia, mild leukopenia, PLT are nml, blood chemistries are steady -  Discussed 07/12/2019 K/L light chains is as follows: Kappa free light chain at 67.7, Lamda free light chains at 38.8, K/L light chain ratio at 1.74, 07/12/2019 MMP shows no M Protein observed -Discussed 05/25/2019 Bone Scan ([id number]) which revealed "Normal nuclear medicine whole-body bone scan." -Leukopenia is likely caused by Revlimid. Not prohibitive at this time.  -Based on last labs pt is still in laboratory and clinical remission of Multiple Myeloma -The pt has no prohibitive toxicities from continuing maintenance 40m Revlimid, three weeks on and one week off, at this time. -Recommend pt f/u with Dr. BDoreene Nest for Orthopedic referral for rt thigh/knee pain. -Continue 81 mg Aspirin daily  -Will see back in 2 months, with labs 1 week prior   4) 5) DM2 - controlled -continue f/u with PCP.    FOLLOW UP: RTC with Dr KIrene Limbowith labs in 2 months. Plz schedule labs 1 week prior to clinic visit.   The total time spent in the appt was 20 minutes and more than 50% was on counseling and direct patient cares.  All of the patient's questions were answered with apparent satisfaction. The patient knows to call the clinic with any problems, questions or concerns.   GSullivan LoneMD MGardenaAAHIVMS SThe Jerome Golden Center For Behavioral HealthCLower Keys Medical CenterHematology/Oncology Physician CBlanchfield Army Community Hospital (Office):       [phone](Work cell):  [phone](Fax):           [phone] I, JYevette Edwards am acting as a scribe for Dr. GSullivan Lone   .I have reviewed the above documentation for accuracy and completeness, and I agree with the above. .Brunetta GeneraMD

## 2019-08-08 NOTE — Progress Notes (Unsigned)
Cbc

## 2019-08-09 ENCOUNTER — Other Ambulatory Visit: Payer: Self-pay | Admitting: *Deleted

## 2019-08-09 DIAGNOSIS — C9000 Multiple myeloma not having achieved remission: Secondary | ICD-10-CM

## 2019-08-09 MED ORDER — LENALIDOMIDE 10 MG PO CAPS: 10.0000 mg | ORAL_CAPSULE | Freq: Every day | ORAL | 0 refills | Status: DC

## 2019-08-09 NOTE — Telephone Encounter (Signed)
Received faxed refill request from [facility] for Revlimid  Revlimid escribed to [place], [place] per Dr Irene Limbo verbal order [facility] Auth # [id number], 08/09/19

## 2019-08-10 ENCOUNTER — Telehealth: Payer: Self-pay | Admitting: Hematology

## 2019-08-10 NOTE — Telephone Encounter (Signed)
Scheduled per los, patient has been called and notified. 

## 2019-09-01 ENCOUNTER — Other Ambulatory Visit: Payer: Self-pay | Admitting: Hematology

## 2019-09-01 DIAGNOSIS — C9000 Multiple myeloma not having achieved remission: Secondary | ICD-10-CM

## 2019-09-27 DIAGNOSIS — M25551 Pain in right hip: Secondary | ICD-10-CM | POA: Insufficient documentation

## 2019-09-27 HISTORY — DX: Pain in right hip: M25.551

## 2019-09-28 ENCOUNTER — Other Ambulatory Visit: Payer: Self-pay | Admitting: Hematology

## 2019-09-28 ENCOUNTER — Other Ambulatory Visit: Payer: Self-pay | Admitting: *Deleted

## 2019-09-28 DIAGNOSIS — C9000 Multiple myeloma not having achieved remission: Secondary | ICD-10-CM

## 2019-09-28 MED ORDER — LENALIDOMIDE 10 MG PO CAPS: ORAL_CAPSULE | ORAL | 0 refills | Status: DC

## 2019-09-28 NOTE — Telephone Encounter (Signed)
Contacted by [facility]. Auth # needed for Revlimid refill. Fax # to [phone] or call to [phone] [id number], 09/28/19

## 2019-09-30 ENCOUNTER — Other Ambulatory Visit: Payer: Self-pay | Admitting: Hematology

## 2019-09-30 DIAGNOSIS — C9000 Multiple myeloma not having achieved remission: Secondary | ICD-10-CM

## 2019-10-10 ENCOUNTER — Inpatient Hospital Stay: Payer: Medicare HMO | Attending: Hematology

## 2019-10-10 ENCOUNTER — Other Ambulatory Visit: Payer: Self-pay

## 2019-10-10 DIAGNOSIS — G629 Polyneuropathy, unspecified: Secondary | ICD-10-CM | POA: Diagnosis not present

## 2019-10-10 DIAGNOSIS — C9001 Multiple myeloma in remission: Secondary | ICD-10-CM | POA: Diagnosis present

## 2019-10-10 DIAGNOSIS — C9000 Multiple myeloma not having achieved remission: Secondary | ICD-10-CM

## 2019-10-10 LAB — CMP (CANCER CENTER ONLY)
ALT: 25 U/L (ref 0–44)
AST: 23 U/L (ref 15–41)
Albumin: 3 g/dL — ABNORMAL LOW (ref 3.5–5.0)
Alkaline Phosphatase: 90 U/L (ref 38–126)
Anion gap: 3 — ABNORMAL LOW (ref 5–15)
BUN: 24 mg/dL — ABNORMAL HIGH (ref 8–23)
CO2: 26 mmol/L (ref 22–32)
Calcium: 9 mg/dL (ref 8.9–10.3)
Chloride: 106 mmol/L (ref 98–111)
Creatinine: 1.44 mg/dL — ABNORMAL HIGH (ref 0.61–1.24)
GFR, Est AFR Am: 55 mL/min — ABNORMAL LOW (ref >60)
GFR, Estimated: 47 mL/min — ABNORMAL LOW (ref >60)
Glucose, Bld: 162 mg/dL — ABNORMAL HIGH (ref 70–99)
Potassium: 4.1 mmol/L (ref 3.5–5.1)
Sodium: 135 mmol/L (ref 135–145)
Total Bilirubin: 0.3 mg/dL (ref 0.3–1.2)
Total Protein: 7.4 g/dL (ref 6.5–8.1)

## 2019-10-10 LAB — CBC WITH DIFFERENTIAL/PLATELET
Abs Immature Granulocytes: 0.01 10*3/uL (ref 0.00–0.07)
Basophils Absolute: 0 10*3/uL (ref 0.0–0.1)
Basophils Relative: 1 %
Eosinophils Absolute: 0.1 10*3/uL (ref 0.0–0.5)
Eosinophils Relative: 3 %
HCT: 34.4 % — ABNORMAL LOW (ref 39.0–52.0)
Hemoglobin: 11 g/dL — ABNORMAL LOW (ref 13.0–17.0)
Immature Granulocytes: 0 %
Lymphocytes Relative: 28 %
Lymphs Abs: 1 10*3/uL (ref 0.7–4.0)
MCH: 28.2 pg (ref 26.0–34.0)
MCHC: 32 g/dL (ref 30.0–36.0)
MCV: 88.2 fL (ref 80.0–100.0)
Monocytes Absolute: 0.6 10*3/uL (ref 0.1–1.0)
Monocytes Relative: 16 %
Neutro Abs: 1.8 10*3/uL (ref 1.7–7.7)
Neutrophils Relative %: 52 %
Platelets: 198 10*3/uL (ref 150–400)
RBC: 3.9 MIL/uL — ABNORMAL LOW (ref 4.22–5.81)
RDW: 17.9 % — ABNORMAL HIGH (ref 11.5–15.5)
WBC: 3.5 10*3/uL — ABNORMAL LOW (ref 4.0–10.5)
nRBC: 0 % (ref 0.0–0.2)

## 2019-10-10 LAB — VITAMIN D 25 HYDROXY (VIT D DEFICIENCY, FRACTURES): Vit D, 25-Hydroxy: 39.2 ng/mL (ref 30–100)

## 2019-10-12 LAB — MULTIPLE MYELOMA PANEL, SERUM
Albumin SerPl Elph-Mcnc: 3.2 g/dL (ref 2.9–4.4)
Albumin/Glob SerPl: 0.9 (ref 0.7–1.7)
Alpha 1: 0.3 g/dL (ref 0.0–0.4)
Alpha2 Glob SerPl Elph-Mcnc: 0.8 g/dL (ref 0.4–1.0)
B-Globulin SerPl Elph-Mcnc: 0.9 g/dL (ref 0.7–1.3)
Gamma Glob SerPl Elph-Mcnc: 1.7 g/dL (ref 0.4–1.8)
Globulin, Total: 3.7 g/dL (ref 2.2–3.9)
IgA: 378 mg/dL (ref 61–437)
IgG (Immunoglobin G), Serum: 1579 mg/dL (ref 603–1613)
IgM (Immunoglobulin M), Srm: 31 mg/dL (ref 15–143)
Total Protein ELP: 6.9 g/dL (ref 6.0–8.5)

## 2019-10-18 ENCOUNTER — Inpatient Hospital Stay (HOSPITAL_BASED_OUTPATIENT_CLINIC_OR_DEPARTMENT_OTHER): Payer: Medicare HMO | Admitting: Hematology

## 2019-10-18 ENCOUNTER — Telehealth: Payer: Self-pay | Admitting: Hematology

## 2019-10-18 ENCOUNTER — Other Ambulatory Visit: Payer: Self-pay

## 2019-10-18 VITALS — BP 130/76 | HR 64 | Temp 97.3°F | Resp 18 | Ht 71.0 in | Wt 173.6 lb

## 2019-10-18 DIAGNOSIS — C9000 Multiple myeloma not having achieved remission: Secondary | ICD-10-CM

## 2019-10-18 DIAGNOSIS — C9001 Multiple myeloma in remission: Secondary | ICD-10-CM | POA: Diagnosis not present

## 2019-10-18 DIAGNOSIS — D702 Other drug-induced agranulocytosis: Secondary | ICD-10-CM | POA: Diagnosis not present

## 2019-10-18 NOTE — Telephone Encounter (Signed)
Scheduled per 10/12 los. Printed avs and calendar for pt.

## 2019-10-18 NOTE — Telephone Encounter (Signed)
Rescheduled a week earlier per pt request. Pt did not want to come in for follow up the week of christmas. Pt is aware of appt times and dates per 10/12 los. Messaged RN Sandi and GK to be aware.

## 2019-10-18 NOTE — Progress Notes (Signed)
Douglas Rose Kitchen  HEMATOLOGY ONCOLOGY PROGRESS NOTE  Date of service: 10/20/19  Patient Care Team: Douglas Mires, MD as PCP - General (Family Medicine)  CC: f/u for multiple myeloma  Diagnosis:  IgG Kappa multiple myeloma   Current Treatment:  Revlimid maintenance   INTERVAL HISTORY: Douglas Rose presents today for management and evaluation of his maintenance Revlimid for his Multiple Myeloma. The patient's last visit with [name] was on 08/08/2019. The pt reports that he is doing well overall.  The pt reports occasional pain in his back that travels around his sides. He also has mild neuropathy in his hands and feet. This is not limiting at this time. Pt is eating and drinking well and denies significant fatigue. He has an appointment with his Cardiologist on Friday. He is scheduled to see the Surgeon on 11/14/19 for a lipoma resection. Pt has not used Nitroglycerin in nearly four years and denies using any other nitrates at this time. Pt is currently taking 2500 IU Vitamin D daily.   Lab results (10/10/19) of CBC w/diff and CMP is as follows: all values are WNL except for WBC at 3.5K, RBC at 3.90, Hgb at 11.0, HCT at 34.4, RDW at 17.9, Glucose at 162, BUN at 24, Creatinine at 1.44, Albumin at 3.0, GFR Est Afr Am at 55, Anion gap at 3. 10/10/2019 Vitamin D 25 (OH) at 39.20 10/10/2019 MMP shows all values are WNL  On review of systems, pt reports back pain, mild neuropathy and denies fatigue, fevers, chills, night sweats and any other symptoms.   REVIEW OF SYSTEMS:   A 10+ POINT REVIEW OF SYSTEMS WAS OBTAINED including neurology, dermatology, psychiatry, cardiac, respiratory, lymph, extremities, GI, GU, Musculoskeletal, constitutional, breasts, reproductive, HEENT.  All pertinent positives are noted in the HPI.  All others are negative.   Past Medical History:  Diagnosis Date  . Abdominal fullness 02/26/2016   Last Assessment & Plan:  overall improving with no pain, no further nausea and normal BM  today.  No signs of obstruction or infection today.  Due for 10 year colonoscopy, mild anemia in December, will refer to GI  . Acute renal failure ([place]) 03/06/2016  . Acute renal failure superimposed on chronic kidney disease ([place]) 03/01/2016  . AKI (acute kidney injury) ([place])   . Arterial insufficiency of lower extremity ([place]) 09/01/2016   Overview:  Mild (2018)  Last Assessment & Plan:  Mild arterial insufficiency, he does not have classic symptoms of claudication. Advised daily foot checks and would consider further evaluation if new or worsening symptoms  Mild (2018)  Last Assessment & Plan:  asymptomatic.  We will follow-up as needed Formatting of this note might be different from the original. Mild (2018)  Last Assessment & Pla  . CAD (coronary artery disease) 2006   stent LAD 3.0x18 [place], occluded RCA  . Cancer ([place])    multiple myeloma  . Cervical lymphadenopathy 03/31/2016  . Colitis 05/12/2016  . Coronary artery disease 07/21/2011   stent LAD 3.0x18 [place], occluded RCA  Overview:  Stent 2006 at [facility], Low risk stress test feb 2017  Last Assessment & Plan:  Declined further stress testing in 2015 due to cost and absence of symptoms.  Continues to be asymptomatic.  He has upcoming follow-up with cardiology for review. Encouraged to keep appt  Stent 2006 at [facility], Low risk s  . Diabetes mellitus without complication ([place])   . DNR (do not resuscitate) discussion   . Enlarged  prostate 03/02/2016  . Enteritis due to Clostridium difficile   . Epidermoid cyst of skin 03/31/2016  . Generalized abdominal pain   . H/O non-insulin dependent diabetes mellitus   . Hyperlipidemia   . Hypertension   . Infected tooth 03/31/2016  . Leukocytosis   . Lipoma 07/19/2014   Overview:  Right scalp.  Seen by Payton Mccallum in past  Last Assessment & Plan:  Lipoma on right scalp getting larger, will refer to ENT, but more concerned about lymph node  . Lymphadenopathy  11/21/2015   Last Assessment & Plan:  Right submandibular lymph node, non-tender, now larger. CBC 2 months ago showed mild anemia, normal peripheral smear.  Will recheck today along with ESR, sed rate.  Will refer to ENT for further evaluation.    Last Assessment & Plan:   Soft, right-sided postauricular mass, deferred intervention in 2018 at onset of myeloma diagnosis, gradually enlarging over time.  Had cons  . Monoclonal gammopathy   . Multiple myeloma ([place]) 03/20/2016   Last Assessment & Plan:  Given dx, rec social distancing, avoiding in person jury duty.  Letter provided Formatting of this note might be different from the original. Last Assessment & Plan:  Myeloma treatment is going well, he has upcoming oncology visit  . Palliative care by specialist   . Pancreatitis 03/02/2016  . Peripheral neuropathy 07/22/2016   Last Assessment & Plan:  Overall improved. Patient has discontinued gabapentin, did not feel much difference  Feet and fingers.  Multifactorial, DM, Chemo, MM  Last Assessment & Plan:   Doing well on gabapentin Formatting of this note might be different from the original. Last Assessment & Plan:  Overall improved. Patient has discontinued gabapentin, did not feel much difference  . Physical deconditioning 06/13/2016   Last Assessment & Plan:  Marked weakness.  Will request further home assistance per home health with bathing.  Discharged on a supplement- patient reports is unaffordable.  Advised prefer food as no barriers, appetite reasonable, ok to use glucerna or carnation instant breakfast if desired.  . Pre-operative clearance 01/30/2017  . Protein-calorie malnutrition, severe 03/03/2016  . Right hip pain 09/27/2019   Last Assessment & Plan:  Formatting of this note might be different from the original.  Recent tests reassuring for no malignancy.  known hip arthritis.  Symptoms today separate from neuropathy.  He plans on starting program with trainer at [facility], declined PT today for gait  and strength.  Will reassess at followup.  . Sinus tachycardia 05/13/2016  . SIRS (systemic inflammatory response syndrome) ([place]) 05/12/2016  . Stage 3 chronic kidney disease ([place]) 06/13/2016   Last Assessment & Plan:  Will check Cr/K today  June 2020, annual follow-up [place] kidney  Last Assessment & Plan:   Recent labs reviewed- stable Formatting of this note might be different from the original. Last Assessment & Plan:  Will check Cr/K today  . Type 2 diabetes mellitus ([place]) 07/21/2011   Last Assessment & Plan:   Diabetes improved with fasting glucoses in a safe range.  Continue current regimen. Formatting of this note might be different from the original. Last Assessment & Plan:  Patient's diabetes has been well controlled at baseline on 12 units of Lantus daily.  He reports some hyperglycemia on the first few days after dexamethasone with resolution by the end of the week. We'll  . Upper abdominal pain   . Urinary retention   . Weakness generalized     . Past Surgical History:  Procedure Laterality  Date  . CARDIAC CATHETERIZATION  2006  . CORONARY ANGIOPLASTY WITH STENT PLACEMENT  2007  . IR FLUORO GUIDE CV LINE RIGHT  05/20/2016  . IR US GUIDE VASC ACCESS RIGHT  05/20/2016    . Social History   Tobacco Use  . Smoking status: Never Smoker  . Smokeless tobacco: Never Used  Vaping Use  . Vaping Use: Never used  Substance Use Topics  . Alcohol use: No  . Drug use: No    ALLERGIES:  has No Known Allergies.  MEDICATIONS:  Current Outpatient Medications  Medication Sig Dispense Refill  . acyclovir (ZOVIRAX) 200 MG capsule Take 1 capsule by mouth twice daily 180 capsule 0  . amLODipine (NORVASC) 10 MG tablet Take 10 mg by mouth daily.    Douglas Rose Kitchen aspirin 81 MG tablet Take 81 mg by mouth 2 (two) times daily. 2 tablets (162 mg) twice daily    . atorvastatin (LIPITOR) 20 MG tablet Take 1 tablet (20 mg total) by mouth daily. 90 tablet 3  . Blood Glucose Monitoring Suppl (ACCU-CHEK AVIVA PLUS)  w/Device KIT by Does not apply route.    . Cholecalciferol 10 MCG (400 UNIT) CAPS Take 1 capsule by mouth daily.    Douglas Rose Kitchen gabapentin (NEURONTIN) 300 MG capsule Take 300 mg by mouth.    . Insulin Glargine (LANTUS SOLOSTAR) 100 UNIT/ML Solostar Pen Inject 20 Units into the skin daily. (Patient taking differently: Inject 16 Units into the skin daily. ) 15 mL 11  . lenalidomide (REVLIMID) 10 MG capsule TAKE 1 CAPSULE BY MOUTH DAILY FOR 21 DAYS, THEN 7 DAYS OFF - TAKE WITH WATER 21 capsule 0  . lisinopril (ZESTRIL) 2.5 MG tablet Take 2.5 mg by mouth daily.    . metoprolol succinate (TOPROL-XL) 100 MG 24 hr tablet Take by mouth.    . nitroGLYCERIN (NITROSTAT) 0.4 MG SL tablet Place 1 tablet (0.4 mg total) under the tongue every 5 (five) minutes x 3 doses as needed for chest pain. 30 tablet 12  . RELION PEN NEEDLES 32G X 4 MM MISC     . vitamin B-12 (CYANOCOBALAMIN) 1000 MCG tablet Take 1,000 mcg by mouth daily.     No current facility-administered medications for this visit.    PHYSICAL EXAMINATION: ECOG PERFORMANCE STATUS: 1 - Symptomatic but completely ambulatory  Vitals:   10/18/19 1413  BP: 130/76  Pulse: 64  Resp: 18  Temp: (!) 97.3 F (36.3 C)  SpO2: 100%    Filed Weights   10/18/19 1413  Weight: 173 lb 9.6 oz (78.7 kg)   .Body mass index is 24.21 kg/m.   GENERAL:alert, in no acute distress and comfortable SKIN: no acute rashes, no significant lesions EYES: conjunctiva are pink and non-injected, sclera anicteric OROPHARYNX: MMM, no exudates, no oropharyngeal erythema or ulceration NECK: supple, no JVD LYMPH:  no palpable lymphadenopathy in the cervical, axillary or inguinal regions LUNGS: clear to auscultation b/l with normal respiratory effort HEART: regular rate & rhythm ABDOMEN:  normoactive bowel sounds , non tender, not distended. No palpable hepatosplenomegaly.  Extremity: no pedal edema PSYCH: alert & oriented x 3 with fluent speech NEURO: no focal motor/sensory  deficits  LABORATORY DATA:   I have reviewed the data as listed  06/08/2018 MMP Component     Latest Ref Rng & Units 06/08/2018  IgG (Immunoglobin G), Serum     603 - 1,613 mg/dL 1,556  IgA     61 - 437 mg/dL 303  IgM (Immunoglobulin M), Srm  15 - 143 mg/dL 36  Total Protein ELP     6.0 - 8.5 g/dL 6.5  Albumin SerPl Elph-Mcnc     2.9 - 4.4 g/dL 3.2  Alpha 1     0.0 - 0.4 g/dL 0.2  Alpha2 Glob SerPl Elph-Mcnc     0.4 - 1.0 g/dL 0.8  B-Globulin SerPl Elph-Mcnc     0.7 - 1.3 g/dL 0.9  Gamma Glob SerPl Elph-Mcnc     0.4 - 1.8 g/dL 1.5  M Protein SerPl Elph-Mcnc     Not Observed g/dL Not Observed  Globulin, Total     2.2 - 3.9 g/dL 3.3  Albumin/Glob SerPl     0.7 - 1.7 1.0  IFE 1      Comment  Please Note (HCV):      Comment   . CBC Latest Ref Rng & Units 10/10/2019 08/08/2019 07/12/2019  WBC 4.0 - 10.5 K/uL 3.5(L) 2.6(L) 3.7(L)  Hemoglobin 13.0 - 17.0 g/dL 11.0(L) 10.6(L) 12.0(L)  Hematocrit 39 - 52 % 34.4(L) 33.0(L) 38.1(L)  Platelets 150 - 400 K/uL 198 152 188  ANC 1k  . CMP Latest Ref Rng & Units 10/10/2019 08/08/2019 07/12/2019  Glucose 70 - 99 mg/dL 162(H) 98 146(H)  BUN 8 - 23 mg/dL 24(H) 21 14  Creatinine 0.61 - 1.24 mg/dL 1.44(H) 1.56(H) 1.66(H)  Sodium 135 - 145 mmol/L 135 137 139  Potassium 3.5 - 5.1 mmol/L 4.1 3.5 3.6  Chloride 98 - 111 mmol/L 106 105 107  CO2 22 - 32 mmol/L 26 26 25   Calcium 8.9 - 10.3 mg/dL 9.0 9.0 9.1  Total Protein 6.5 - 8.1 g/dL 7.4 7.0 8.0  Total Bilirubin 0.3 - 1.2 mg/dL 0.3 0.4 0.4  Alkaline Phos 38 - 126 U/L 90 77 98  AST 15 - 41 U/L 23 12(L) 14(L)  ALT 0 - 44 U/L 25 16 17        RADIOGRAPHIC STUDIES: I have personally reviewed the radiological images as listed and agreed with the findings in the report. No results found.  ASSESSMENT & PLAN:   74 y.o.  with multiple medical comorbidities but fairly good performance status overall with   1) ISS Stage II IgG Kapppa multiple myeloma. Anemia + Renal insufficiency.  Bone  survey neg for concerning bone lesions. significantly elevated kappafree light chains 858, lambda 18.4, with a ratio of 46.65 . He was also noted to have an M spike of 1.7 g/dL with IFE showing IgG kappa monoclonal protein. Quantitative IgG level was increased to nearly 2500 mg/dL. 24-hour UPEP showed monoclonal protein of 1300 mg per 24 hours which constituted about 86% of all the urinary protein. Bone marrow biopsy showed 20-30% Restricted plasma cells consistent with multiple myeloma Cytogenetics/FISH  Showed +4, +11, and +17   Patient has been treated with CyBord X 2 cycles . Treatment interrupted due to severe c diff colitis with prolonged hospitalization and decline in performance status. Patient is much improved and back to baseline now. M protein undetected with neg IFE Has been treated with 5 cycles of Rd  Currently on maintenance Rituxan and in Remission.  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 Had an element of obstructive uropathy due to BPH which has resolved. Creatinine continues to be stable.  3) Normocytic anemia likely related to multiple myeloma.-stable, improved. hgb 11.5 No abdominal pain or discomfort over the right upper quadrant. No fevers or chills.  PLAN:  -Discussed pt labwork, 10/10/19; Hgb looks good, WBC  has improved, PLT are nml, chemistries are stable, Vitamin D is low, no M Protein observed. -Based on last labs pt is still in laboratory and clinical remission of Multiple Myeloma. -The pt has no prohibitive toxicities from continuing maintenance 10 mg Revlimid, three weeks on and one week off, at this time. -Advised pt that he is not Vitamin D deficient, but his levels can still be optimized. Recommend pt begin 5000 IU daily or 50K IU Vitamin D weekly. -Discussed CDC guidelines regarding the COVID19 booster. Advised pt that he is eligible for the booster due to being on active treatment.  -Advised pt that if he  is not using any nitrates and is cleared by the Cardiologist he could consider using medications to treat his ED - no contraindications from a Revlimid or Multiple Myeloma standpoint.  -Recommend pt wear a condom or other barrier contraceptive during intercourse to prevent the transfer of seminal fluid.  -Advised pt that Testosterone replacement, especially while on Revlimid, can increase the risk of blood clots.  -Advised pt that it's okay to discontinue Acyclovir at this time.  -Recommend pt f/u with Cardiologist as scheduled. -Continue 81 mg Aspirin daily  -Will see back in 10 weeks, with labs 1 week prior   4) 5) DM2 - controlled -continue f/u with PCP.    FOLLOW UP: Labs in 9 weeks RTC with Dr Irene Limbo in 10 weeks   The total time spent in the appt was 20 minutes and more than 50% was on counseling and direct patient cares.  All of the patient's questions were answered with apparent satisfaction. The patient knows to call the clinic with any problems, questions or concerns.   Sullivan Lone MD [place] AAHIVMS [facility] Hematology/Oncology Physician [facility]  (Office):       [phone] (Work cell):  [phone] (Fax):           [phone]  I, Yevette Edwards, am acting as a scribe for Dr. Sullivan Lone.   .I have reviewed the above documentation for accuracy and completeness, and I agree with the above. Brunetta Genera MD

## 2019-10-19 ENCOUNTER — Other Ambulatory Visit: Payer: Self-pay

## 2019-10-19 DIAGNOSIS — A0472 Enterocolitis due to Clostridium difficile, not specified as recurrent: Secondary | ICD-10-CM | POA: Insufficient documentation

## 2019-10-19 DIAGNOSIS — C801 Malignant (primary) neoplasm, unspecified: Secondary | ICD-10-CM | POA: Insufficient documentation

## 2019-10-19 DIAGNOSIS — E119 Type 2 diabetes mellitus without complications: Secondary | ICD-10-CM | POA: Insufficient documentation

## 2019-10-21 ENCOUNTER — Other Ambulatory Visit: Payer: Self-pay

## 2019-10-21 ENCOUNTER — Ambulatory Visit (INDEPENDENT_AMBULATORY_CARE_PROVIDER_SITE_OTHER): Payer: Medicare HMO | Admitting: Cardiology

## 2019-10-21 ENCOUNTER — Encounter: Payer: Self-pay | Admitting: Cardiology

## 2019-10-21 VITALS — BP 148/78 | HR 64 | Ht 71.0 in | Wt 172.0 lb

## 2019-10-21 DIAGNOSIS — I10 Essential (primary) hypertension: Secondary | ICD-10-CM

## 2019-10-21 DIAGNOSIS — I2510 Atherosclerotic heart disease of native coronary artery without angina pectoris: Secondary | ICD-10-CM | POA: Diagnosis not present

## 2019-10-21 DIAGNOSIS — E119 Type 2 diabetes mellitus without complications: Secondary | ICD-10-CM | POA: Diagnosis not present

## 2019-10-21 NOTE — Patient Instructions (Signed)
Medication Instructions:  No medication changes. *If you need a refill on your cardiac medications before your next appointment, please call your pharmacy*   Lab Work: None ordered If you have labs (blood work) drawn today and your tests are completely normal, you will receive your results only by: . MyChart Message (if you have MyChart) OR . A paper copy in the mail If you have any lab test that is abnormal or we need to change your treatment, we will call you to review the results.   Testing/Procedures: Your physician has requested that you have an echocardiogram. Echocardiography is a painless test that uses sound waves to create images of your heart. It provides your doctor with information about the size and shape of your heart and how well your heart's chambers and valves are working. This procedure takes approximately one hour. There are no restrictions for this procedure.     Follow-Up: At [facility], you and your health needs are our priority.  As part of our continuing mission to provide you with exceptional heart care, we have created designated Provider Care Teams.  These Care Teams include your primary Cardiologist (physician) and Advanced Practice Providers (APPs -  Physician Assistants and Nurse Practitioners) who all work together to provide you with the care you need, when you need it.  We recommend signing up for the patient portal called "MyChart".  Sign up information is provided on this After Visit Summary.  MyChart is used to connect with patients for Virtual Visits (Telemedicine).  Patients are able to view lab/test results, encounter notes, upcoming appointments, etc.  Non-urgent messages can be sent to your provider as well.   To learn more about what you can do with MyChart, go to [email or web address].    Your next appointment:   6 month(s)  The format for your next appointment:   In Person  Provider:   Rajan Revankar, MD   Other  Instructions  Echocardiogram An echocardiogram is a procedure that uses painless sound waves (ultrasound) to produce an image of the heart. Images from an echocardiogram can provide important information about:  Signs of coronary artery disease (CAD).  Aneurysm detection. An aneurysm is a weak or damaged part of an artery wall that bulges out from the normal force of blood pumping through the body.  Heart size and shape. Changes in the size or shape of the heart can be associated with certain conditions, including heart failure, aneurysm, and CAD.  Heart muscle function.  Heart valve function.  Signs of a past heart attack.  Fluid buildup around the heart.  Thickening of the heart muscle.  A tumor or infectious growth around the heart valves. Tell a health care provider about:  Any allergies you have.  All medicines you are taking, including vitamins, herbs, eye drops, creams, and over-the-counter medicines.  Any blood disorders you have.  Any surgeries you have had.  Any medical conditions you have.  Whether you are pregnant or may be pregnant. What are the risks? Generally, this is a safe procedure. However, problems may occur, including:  Allergic reaction to dye (contrast) that may be used during the procedure. What happens before the procedure? No specific preparation is needed. You may eat and drink normally. What happens during the procedure?   An IV tube may be inserted into one of your veins.  You may receive contrast through this tube. A contrast is an injection that improves the quality of the pictures from your heart.  A   gel will be applied to your chest.  A wand-like tool (transducer) will be moved over your chest. The gel will help to transmit the sound waves from the transducer.  The sound waves will harmlessly bounce off of your heart to allow the heart images to be captured in real-time motion. The images will be recorded on a computer. The  procedure may vary among health care providers and hospitals. What happens after the procedure?  You may return to your normal, everyday life, including diet, activities, and medicines, unless your health care provider tells you not to do that. Summary  An echocardiogram is a procedure that uses painless sound waves (ultrasound) to produce an image of the heart.  Images from an echocardiogram can provide important information about the size and shape of your heart, heart muscle function, heart valve function, and fluid buildup around your heart.  You do not need to do anything to prepare before this procedure. You may eat and drink normally.  After the echocardiogram is completed, you may return to your normal, everyday life, unless your health care provider tells you not to do that. This information is not intended to replace advice given to you by your health care provider. Make sure you discuss any questions you have with your health care provider. Document Revised: 04/15/2018 Document Reviewed: 01/26/2016 Elsevier Patient Education  2020 [employer]   

## 2019-10-21 NOTE — Progress Notes (Signed)
Cardiology Office Note:    Date:  10/21/2019   ID:  Douglas Downum Jr., DOB 07/12/1945, MRN [id number]  PCP:  Briscoe, Kim K, MD  Cardiologist:   R , MD   Referring MD: Briscoe, Kim K, MD    ASSESSMENT:    1. Coronary artery disease involving native coronary artery of native heart without angina pectoris   2. Diabetes mellitus without complication (HCC)   3. Hypertension, unspecified type    PLAN:    In order of problems listed above:  1. Coronary artery disease: Secondary prevention stressed with the patient. Importance of compliance with diet medication stressed and vocalized understanding. He walks some but does not do enough because of orthopedic issues. 2. Essential hypertension: Blood pressure is stable diet was emphasized 3. Diabetes mellitus: Managed by primary care physician. Diet was emphasized. 4. Mixed dyslipidemia: On statin therapy. Lipids are followed by primary care physician and will get a copy of his lab work. 5. I ensured that he does carry nitroglycerin with him at all times.Patient will be seen in follow-up appointment in 6 months or earlier if the patient has any concerns    Medication Adjustments/Labs and Tests Ordered: Current medicines are reviewed at length with the patient today.  Concerns regarding medicines are outlined above.  No orders of the defined types were placed in this encounter.  No orders of the defined types were placed in this encounter.    No chief complaint on file.    History of Present Illness:    Douglas Tuazon Jr. is a 74 y.o. male. Patient has past medical history of coronary artery disease post stenting in 2015, essential hypertension dyslipidemia and diabetes mellitus. He denies any problems at this time and takes care of activities of daily living. No chest pain orthopnea or PND. At the time of my evaluation, the patient is alert awake oriented and in no distress.  Past Medical History:  Diagnosis Date  .  Abdominal fullness 02/26/2016   Last Assessment & Plan:  overall improving with no pain, no further nausea and normal BM today.  No signs of obstruction or infection today.  Due for 10 year colonoscopy, mild anemia in December, will refer to GI  . Acute renal failure (HCC) 03/06/2016  . Acute renal failure superimposed on chronic kidney disease (HCC) 03/01/2016  . AKI (acute kidney injury) (HCC)   . Arterial insufficiency of lower extremity (HCC) 09/01/2016   Overview:  Mild (2018)  Last Assessment & Plan:  Mild arterial insufficiency, he does not have classic symptoms of claudication. Advised daily foot checks and would consider further evaluation if new or worsening symptoms  Mild (2018)  Last Assessment & Plan:  asymptomatic.  We will follow-up as needed Formatting of this note might be different from the original. Mild (2018)  Last Assessment & Pla  . CAD (coronary artery disease) 2006   stent LAD 3.0x18 [place], occluded RCA  . Cancer (HCC)    multiple myeloma  . Cervical lymphadenopathy 03/31/2016  . Colitis 05/12/2016  . Coronary artery disease 07/21/2011   stent LAD 3.0x18 Cypher, occluded RCA  Overview:  Stent 2006 at [facility], Low risk stress test feb 2017  Last Assessment & Plan:  Declined further stress testing in 2015 due to cost and absence of symptoms.  Continues to be asymptomatic.  He has upcoming follow-up with cardiology for review. Encouraged to keep appt  Stent 2006 at [facility], Low risk s  .   Diabetes mellitus without complication ([place])   . DNR (do not resuscitate) discussion   . Enlarged prostate 03/02/2016  . Enteritis due to Clostridium difficile   . Epidermoid cyst of skin 03/31/2016  . Generalized abdominal pain   . H/O non-insulin dependent diabetes mellitus   . Hyperlipidemia   . Hypertension   . Infected tooth 03/31/2016  . Leukocytosis   . Lipoma 07/19/2014   Overview:  Right scalp.  Seen by Payton Mccallum in past  Last Assessment &  Plan:  Lipoma on right scalp getting larger, will refer to ENT, but more concerned about lymph node  . Lymphadenopathy 11/21/2015   Last Assessment & Plan:  Right submandibular lymph node, non-tender, now larger. CBC 2 months ago showed mild anemia, normal peripheral smear.  Will recheck today along with ESR, sed rate.  Will refer to ENT for further evaluation.    Last Assessment & Plan:   Soft, right-sided postauricular mass, deferred intervention in 2018 at onset of myeloma diagnosis, gradually enlarging over time.  Had cons  . Monoclonal gammopathy   . Multiple myeloma ([place]) 03/20/2016   Last Assessment & Plan:  Given dx, rec social distancing, avoiding in person jury duty.  Letter provided Formatting of this note might be different from the original. Last Assessment & Plan:  Myeloma treatment is going well, he has upcoming oncology visit  . Palliative care by specialist   . Pancreatitis 03/02/2016  . Peripheral neuropathy 07/22/2016   Last Assessment & Plan:  Overall improved. Patient has discontinued gabapentin, did not feel much difference  Feet and fingers.  Multifactorial, DM, Chemo, MM  Last Assessment & Plan:   Doing well on gabapentin Formatting of this note might be different from the original. Last Assessment & Plan:  Overall improved. Patient has discontinued gabapentin, did not feel much difference  . Physical deconditioning 06/13/2016   Last Assessment & Plan:  Marked weakness.  Will request further home assistance per home health with bathing.  Discharged on a supplement- patient reports is unaffordable.  Advised prefer food as no barriers, appetite reasonable, ok to use glucerna or carnation instant breakfast if desired.  . Pre-operative clearance 01/30/2017  . Protein-calorie malnutrition, severe 03/03/2016  . Right hip pain 09/27/2019   Last Assessment & Plan:  Formatting of this note might be different from the original.  Recent tests reassuring for no malignancy.  known hip arthritis.   Symptoms today separate from neuropathy.  He plans on starting program with trainer at [facility], declined PT today for gait and strength.  Will reassess at followup.  . Sinus tachycardia 05/13/2016  . SIRS (systemic inflammatory response syndrome) ([place]) 05/12/2016  . Stage 3 chronic kidney disease ([place]) 06/13/2016   Last Assessment & Plan:  Will check Cr/K today  June 2020, annual follow-up [facility]  Last Assessment & Plan:   Recent labs reviewed- stable Formatting of this note might be different from the original. Last Assessment & Plan:  Will check Cr/K today  . Type 2 diabetes mellitus ([place]) 07/21/2011   Last Assessment & Plan:   Diabetes improved with fasting glucoses in a safe range.  Continue current regimen. Formatting of this note might be different from the original. Last Assessment & Plan:  Patient's diabetes has been well controlled at baseline on 12 units of Lantus daily.  He reports some hyperglycemia on the first few days after dexamethasone with resolution by the end of the week. We'll  . Upper abdominal pain   . Urinary  retention   . Weakness generalized     Past Surgical History:  Procedure Laterality Date  . CARDIAC CATHETERIZATION  2006  . CORONARY ANGIOPLASTY WITH STENT PLACEMENT  2007  . IR FLUORO GUIDE CV LINE RIGHT  05/20/2016  . IR US GUIDE VASC ACCESS RIGHT  05/20/2016    Current Medications: Current Meds  Medication Sig  . amLODipine (NORVASC) 10 MG tablet Take 10 mg by mouth daily.  [name] aspirin 81 MG tablet Take 81 mg by mouth 2 (two) times daily. 2 tablets (162 mg) twice daily  . atorvastatin (LIPITOR) 20 MG tablet Take 1 tablet (20 mg total) by mouth daily.  . Blood Glucose Monitoring Suppl (ACCU-CHEK AVIVA PLUS) w/Device KIT by Does not apply route.  . Cholecalciferol 10 MCG (400 UNIT) CAPS Take 1 capsule by mouth daily.  [name] gabapentin (NEURONTIN) 300 MG capsule Take 300 mg by mouth.  . Insulin Glargine (LANTUS SOLOSTAR) 100 UNIT/ML Solostar Pen Inject 20 Units into  the skin daily. (Patient taking differently: Inject 16 Units into the skin daily. )  . lenalidomide (REVLIMID) 10 MG capsule TAKE 1 CAPSULE BY MOUTH DAILY FOR 21 DAYS, THEN 7 DAYS OFF - TAKE WITH WATER  . lisinopril (ZESTRIL) 2.5 MG tablet Take 2.5 mg by mouth daily.  . metoprolol succinate (TOPROL-XL) 100 MG 24 hr tablet Take 100 mg by mouth daily.   . nitroGLYCERIN (NITROSTAT) 0.4 MG SL tablet Place 1 tablet (0.4 mg total) under the tongue every 5 (five) minutes x 3 doses as needed for chest pain.  [name] RELION PEN NEEDLES 32G X 4 MM MISC   . vitamin B-12 (CYANOCOBALAMIN) 1000 MCG tablet Take 1,000 mcg by mouth daily.     Allergies:   Patient has no known allergies.   Social History   Socioeconomic History  . Marital status: Married    Spouse name: Not on file  . Number of children: 6  . Years of education: Not on file  . Highest education level: Not on file  Occupational History  . Occupation: retired Company secretary  Tobacco Use  . Smoking status: Never Smoker  . Smokeless tobacco: Never Used  Vaping Use  . Vaping Use: Never used  Substance and Sexual Activity  . Alcohol use: No  . Drug use: No  . Sexual activity: Not Currently  Other Topics Concern  . Not on file  Social History Narrative  . Not on file   Social Determinants of Health   Financial Resource Strain:   . Difficulty of Paying Living Expenses: Not on file  Food Insecurity:   . Worried About Charity fundraiser in the Last Year: Not on file  . Ran Out of Food in the Last Year: Not on file  Transportation Needs:   . Lack of Transportation (Medical): Not on file  . Lack of Transportation (Non-Medical): Not on file  Physical Activity:   . Days of Exercise per Week: Not on file  . Minutes of Exercise per Session: Not on file  Stress:   . Feeling of Stress : Not on file  Social Connections:   . Frequency of Communication with Friends and Family: Not on file  . Frequency of Social Gatherings with Friends and Family:  Not on file  . Attends Religious Services: Not on file  . Active Member of Clubs or Organizations: Not on file  . Attends Archivist Meetings: Not on file  . Marital Status: Not on file     Family  History: The patient's family history includes Arrhythmia in his mother; Cancer in his brother; Diabetes in his brother, brother, and sister; Heart disease in his mother; Stroke in his father.  ROS:   Please see the history of present illness.    All other systems reviewed and are negative.  EKGs/Labs/Other Studies Reviewed:    The following studies were reviewed today: I discussed my findings with the patient at extensive length.   Recent Labs: 10/10/2019: ALT 25; BUN 24; Creatinine 1.44; Hemoglobin 11.0; Platelets 198; Potassium 4.1; Sodium 135  Recent Lipid Panel    Component Value Date/Time   CHOL 134 04/03/2017 1125   TRIG 101 04/03/2017 1125   HDL 50 04/03/2017 1125   CHOLHDL 2.7 04/03/2017 1125   CHOLHDL 4 01/11/2013 1030   VLDL 16.8 01/11/2013 1030   LDLCALC 64 04/03/2017 1125    Physical Exam:    VS:  BP (!) 148/78   Pulse 64   Ht 5' 11" (1.803 m)   Wt 172 lb (78 kg)   SpO2 98%   BMI 23.99 kg/m     Wt Readings from Last 3 Encounters:  10/21/19 172 lb (78 kg)  05/11/19 176 lb (79.8 kg)  11/04/18 178 lb (80.7 kg)     GEN: Patient is in no acute distress HEENT: Normal NECK: No JVD; No carotid bruits LYMPHATICS: No lymphadenopathy CARDIAC: Hear sounds regular, 2/6 systolic murmur at the apex. RESPIRATORY:  Clear to auscultation without rales, wheezing or rhonchi  ABDOMEN: Soft, non-tender, non-distended MUSCULOSKELETAL:  No edema; No deformity  SKIN: Warm and dry NEUROLOGIC:  Alert and oriented x 3 PSYCHIATRIC:  Normal affect   Signed, Jenean Lindau, MD  10/21/2019 11:51 AM    [place]

## 2019-10-25 ENCOUNTER — Telehealth: Payer: Self-pay | Admitting: *Deleted

## 2019-10-25 NOTE — Telephone Encounter (Signed)
Wife called - husband experiencing 2-3 episodes of diarrhea daily. Started around same time as he stopped acyclovir (10/12 - last appt). She wants to know if he should start back on it or what the should do. [name] informed. Contacted wife with Dr. Grier Mitts response: Stopping acyclovir not likely cause of diarrhea. Diarrhea may be caused by Revlimid.  [name] recommended patient either wait 1-2 days to see if diarrhea resolves or he could hold Revlimid for 1 week to see if diarrhea resolves. Wife states she doesn't want him to stop Revlimid because his cancer might come back. Advised that [name] just means for 1 week to see if Revlimid is cause of diarrhea and it resolves when Revlimid is held. She said patient will wait a few days to see if it stops on its own and if not, patient will hold Revlimid for 1 week.  She will contact office to inform if diarrhea resolves or continues

## 2019-10-26 ENCOUNTER — Other Ambulatory Visit: Payer: Self-pay | Admitting: Hematology

## 2019-10-26 DIAGNOSIS — C9000 Multiple myeloma not having achieved remission: Secondary | ICD-10-CM

## 2019-10-27 ENCOUNTER — Other Ambulatory Visit: Payer: Self-pay

## 2019-10-27 DIAGNOSIS — C9000 Multiple myeloma not having achieved remission: Secondary | ICD-10-CM

## 2019-10-27 MED ORDER — LENALIDOMIDE 10 MG PO CAPS: ORAL_CAPSULE | ORAL | 0 refills | Status: DC

## 2019-11-09 ENCOUNTER — Other Ambulatory Visit: Payer: Self-pay

## 2019-11-09 ENCOUNTER — Ambulatory Visit (HOSPITAL_BASED_OUTPATIENT_CLINIC_OR_DEPARTMENT_OTHER)
Admission: RE | Admit: 2019-11-09 | Discharge: 2019-11-09 | Disposition: A | Discharge status: Home / Self Care | Payer: Medicare HMO | Source: Ambulatory Visit | Attending: Cardiology | Admitting: Cardiology

## 2019-11-09 DIAGNOSIS — I2510 Atherosclerotic heart disease of native coronary artery without angina pectoris: Secondary | ICD-10-CM | POA: Insufficient documentation

## 2019-11-09 LAB — ECHOCARDIOGRAM COMPLETE
Area-P 1/2: 2.42 cm2
S' Lateral: 2.47 cm

## 2019-11-23 ENCOUNTER — Other Ambulatory Visit: Payer: Self-pay | Admitting: Hematology

## 2019-11-23 DIAGNOSIS — C9000 Multiple myeloma not having achieved remission: Secondary | ICD-10-CM

## 2019-12-07 ENCOUNTER — Telehealth: Payer: Self-pay | Admitting: *Deleted

## 2019-12-07 NOTE — Telephone Encounter (Signed)
Received Telephone Advice fax from after hours [facility]: Patient called and left message that he needs to reschedule his appointments on 12/7 and 12/14. Schedule message sent.

## 2019-12-13 ENCOUNTER — Other Ambulatory Visit: Payer: Self-pay | Admitting: Hematology

## 2019-12-13 ENCOUNTER — Inpatient Hospital Stay: Payer: Medicare HMO | Attending: Hematology

## 2019-12-13 ENCOUNTER — Other Ambulatory Visit: Payer: Self-pay

## 2019-12-13 DIAGNOSIS — D649 Anemia, unspecified: Secondary | ICD-10-CM | POA: Insufficient documentation

## 2019-12-13 DIAGNOSIS — D702 Other drug-induced agranulocytosis: Secondary | ICD-10-CM

## 2019-12-13 DIAGNOSIS — C9001 Multiple myeloma in remission: Secondary | ICD-10-CM | POA: Insufficient documentation

## 2019-12-13 DIAGNOSIS — C9000 Multiple myeloma not having achieved remission: Secondary | ICD-10-CM

## 2019-12-13 DIAGNOSIS — G629 Polyneuropathy, unspecified: Secondary | ICD-10-CM | POA: Insufficient documentation

## 2019-12-13 LAB — CMP (CANCER CENTER ONLY)
ALT: 30 U/L (ref 0–44)
AST: 23 U/L (ref 15–41)
Albumin: 3.2 g/dL — ABNORMAL LOW (ref 3.5–5.0)
Alkaline Phosphatase: 100 U/L (ref 38–126)
Anion gap: 6 (ref 5–15)
BUN: 18 mg/dL (ref 8–23)
CO2: 28 mmol/L (ref 22–32)
Calcium: 9.1 mg/dL (ref 8.9–10.3)
Chloride: 106 mmol/L (ref 98–111)
Creatinine: 1.6 mg/dL — ABNORMAL HIGH (ref 0.61–1.24)
GFR, Estimated: 44 mL/min — ABNORMAL LOW (ref >60)
Glucose, Bld: 121 mg/dL — ABNORMAL HIGH (ref 70–99)
Potassium: 4 mmol/L (ref 3.5–5.1)
Sodium: 140 mmol/L (ref 135–145)
Total Bilirubin: 0.3 mg/dL (ref 0.3–1.2)
Total Protein: 7.8 g/dL (ref 6.5–8.1)

## 2019-12-13 LAB — CBC WITH DIFFERENTIAL/PLATELET
Abs Immature Granulocytes: 0.02 10*3/uL (ref 0.00–0.07)
Basophils Absolute: 0 10*3/uL (ref 0.0–0.1)
Basophils Relative: 0 %
Eosinophils Absolute: 0.1 10*3/uL (ref 0.0–0.5)
Eosinophils Relative: 4 %
HCT: 37.1 % — ABNORMAL LOW (ref 39.0–52.0)
Hemoglobin: 11.8 g/dL — ABNORMAL LOW (ref 13.0–17.0)
Immature Granulocytes: 1 %
Lymphocytes Relative: 24 %
Lymphs Abs: 0.8 10*3/uL (ref 0.7–4.0)
MCH: 28.2 pg (ref 26.0–34.0)
MCHC: 31.8 g/dL (ref 30.0–36.0)
MCV: 88.5 fL (ref 80.0–100.0)
Monocytes Absolute: 0.3 10*3/uL (ref 0.1–1.0)
Monocytes Relative: 8 %
Neutro Abs: 2 10*3/uL (ref 1.7–7.7)
Neutrophils Relative %: 63 %
Platelets: 222 10*3/uL (ref 150–400)
RBC: 4.19 MIL/uL — ABNORMAL LOW (ref 4.22–5.81)
RDW: 17.4 % — ABNORMAL HIGH (ref 11.5–15.5)
WBC: 3.1 10*3/uL — ABNORMAL LOW (ref 4.0–10.5)
nRBC: 0 % (ref 0.0–0.2)

## 2019-12-14 LAB — MULTIPLE MYELOMA PANEL, SERUM
Albumin SerPl Elph-Mcnc: 3.2 g/dL (ref 2.9–4.4)
Albumin/Glob SerPl: 1 (ref 0.7–1.7)
Alpha 1: 0.2 g/dL (ref 0.0–0.4)
Alpha2 Glob SerPl Elph-Mcnc: 0.8 g/dL (ref 0.4–1.0)
B-Globulin SerPl Elph-Mcnc: 0.9 g/dL (ref 0.7–1.3)
Gamma Glob SerPl Elph-Mcnc: 1.6 g/dL (ref 0.4–1.8)
Globulin, Total: 3.5 g/dL (ref 2.2–3.9)
IgA: 415 mg/dL (ref 61–437)
IgG (Immunoglobin G), Serum: 1775 mg/dL — ABNORMAL HIGH (ref 603–1613)
IgM (Immunoglobulin M), Srm: 33 mg/dL (ref 15–143)
Total Protein ELP: 6.7 g/dL (ref 6.0–8.5)

## 2019-12-14 LAB — KAPPA/LAMBDA LIGHT CHAINS
Kappa free light chain: 67.2 mg/L — ABNORMAL HIGH (ref 3.3–19.4)
Kappa, lambda light chain ratio: 1.76 — ABNORMAL HIGH (ref 0.26–1.65)
Lambda free light chains: 38.1 mg/L — ABNORMAL HIGH (ref 5.7–26.3)

## 2019-12-20 ENCOUNTER — Other Ambulatory Visit: Payer: Self-pay

## 2019-12-20 ENCOUNTER — Other Ambulatory Visit: Payer: Medicare HMO

## 2019-12-20 ENCOUNTER — Inpatient Hospital Stay: Payer: Medicare HMO | Admitting: Hematology

## 2019-12-20 ENCOUNTER — Telehealth: Payer: Self-pay | Admitting: Hematology

## 2019-12-20 VITALS — BP 145/72 | HR 63 | Temp 97.7°F | Resp 16 | Ht 71.0 in | Wt 172.7 lb

## 2019-12-20 DIAGNOSIS — D702 Other drug-induced agranulocytosis: Secondary | ICD-10-CM

## 2019-12-20 DIAGNOSIS — C9001 Multiple myeloma in remission: Secondary | ICD-10-CM | POA: Diagnosis not present

## 2019-12-20 DIAGNOSIS — D84821 Immunodeficiency due to drugs: Secondary | ICD-10-CM | POA: Insufficient documentation

## 2019-12-20 DIAGNOSIS — C9000 Multiple myeloma not having achieved remission: Secondary | ICD-10-CM | POA: Diagnosis not present

## 2019-12-20 DIAGNOSIS — R590 Localized enlarged lymph nodes: Secondary | ICD-10-CM

## 2019-12-20 DIAGNOSIS — Z79899 Other long term (current) drug therapy: Secondary | ICD-10-CM

## 2019-12-20 HISTORY — DX: Other long term (current) drug therapy: Z79.899

## 2019-12-20 NOTE — Progress Notes (Signed)
Douglas Rose  HEMATOLOGY ONCOLOGY PROGRESS NOTE  Date of service: 12/20/19  Patient Care Team: [name], MD as PCP - General (Family Medicine)  CC: f/u for multiple myeloma  Diagnosis:  IgG Kappa multiple myeloma   Current Treatment:  Revlimid maintenance   INTERVAL HISTORY: Douglas Rose presents today for management and evaluation of his maintenance Revlimid for his Multiple Myeloma. The patient's last visit with [name] was on 10/18/2019. The pt reports that he is doing well overall.  The pt reports that he has felt slightly more numbness in his hands. Pt has received his pneumonia vaccines, flu vaccine, and COVID19 vaccines and booster. He is scheduled for a lipoma resection on 01/04/20.   Lab results (12/13/19) of CBC w/diff and CMP is as follows: all values are WNL except for WBC at 3.1K, RBC at 4.19, Hgb at 11.8, HCT at 37.1, RDW at 17.4, Glucose at 121, Creatinine at 1.60, Albumin at 3.2, GFR Est at 44. 12/13/2019 MMP shows all values are WNL except for IgG at [facility] 12/13/2019 K/L light chains is as follows: Kappa free light chain at 67.2, Lamda free light chains at 38.1, K/L light chain ratio at 1.76  On review of systems, pt reports night sweats, tingling in hands and denies fevers, chills, unexpected weight loss, new bone pain, loss of appetite, abdominal pain and any other symptoms.   REVIEW OF SYSTEMS:   A 10+ POINT REVIEW OF SYSTEMS WAS OBTAINED including neurology, dermatology, psychiatry, cardiac, respiratory, lymph, extremities, GI, GU, Musculoskeletal, constitutional, breasts, reproductive, HEENT.  All pertinent positives are noted in the HPI.  All others are negative.   Past Medical History:  Diagnosis Date  . Abdominal fullness 02/26/2016   Last Assessment & Plan:  overall improving with no pain, no further nausea and normal BM today.  No signs of obstruction or infection today.  Due for 10 year colonoscopy, mild anemia in December, will refer to GI  . Acute renal failure  ([place]) 03/06/2016  . Acute renal failure superimposed on chronic kidney disease ([place]) 03/01/2016  . AKI (acute kidney injury) ([place])   . Arterial insufficiency of lower extremity ([place]) 09/01/2016   Overview:  Mild (2018)  Last Assessment & Plan:  Mild arterial insufficiency, he does not have classic symptoms of claudication. Advised daily foot checks and would consider further evaluation if new or worsening symptoms  Mild (2018)  Last Assessment & Plan:  asymptomatic.  We will follow-up as needed Formatting of this note might be different from the original. Mild (2018)  Last Assessment & Pla  . CAD (coronary artery disease) 2006   stent LAD 3.0x18 [place], occluded RCA  . Cancer ([place])    multiple myeloma  . Cervical lymphadenopathy 03/31/2016  . Colitis 05/12/2016  . Coronary artery disease 07/21/2011   stent LAD 3.0x18 [place], occluded RCA  Overview:  Stent 2006 at [facility], Low risk stress test feb 2017  Last Assessment & Plan:  Declined further stress testing in 2015 due to cost and absence of symptoms.  Continues to be asymptomatic.  He has upcoming follow-up with cardiology for review. Encouraged to keep appt  Stent 2006 at [facility], Low risk s  . Diabetes mellitus without complication ([place])   . DNR (do not resuscitate) discussion   . Enlarged prostate 03/02/2016  . Enteritis due to Clostridium difficile   . Epidermoid cyst of skin 03/31/2016  . Generalized abdominal pain   . H/O non-insulin dependent diabetes mellitus   .  Hyperlipidemia   . Hypertension   . Infected tooth 03/31/2016  . Leukocytosis   . Lipoma 07/19/2014   Overview:  Right scalp.  Seen by Payton Mccallum in past  Last Assessment & Plan:  Lipoma on right scalp getting larger, will refer to ENT, but more concerned about lymph node  . Lymphadenopathy 11/21/2015   Last Assessment & Plan:  Right submandibular lymph node, non-tender, now larger. CBC 2 months ago showed mild anemia, normal peripheral  smear.  Will recheck today along with ESR, sed rate.  Will refer to ENT for further evaluation.    Last Assessment & Plan:   Soft, right-sided postauricular mass, deferred intervention in 2018 at onset of myeloma diagnosis, gradually enlarging over time.  Had cons  . Monoclonal gammopathy   . Multiple myeloma ([place]) 03/20/2016   Last Assessment & Plan:  Given dx, rec social distancing, avoiding in person jury duty.  Letter provided Formatting of this note might be different from the original. Last Assessment & Plan:  Myeloma treatment is going well, he has upcoming oncology visit  . Palliative care by specialist   . Pancreatitis 03/02/2016  . Peripheral neuropathy 07/22/2016   Last Assessment & Plan:  Overall improved. Patient has discontinued gabapentin, did not feel much difference  Feet and fingers.  Multifactorial, DM, Chemo, MM  Last Assessment & Plan:   Doing well on gabapentin Formatting of this note might be different from the original. Last Assessment & Plan:  Overall improved. Patient has discontinued gabapentin, did not feel much difference  . Physical deconditioning 06/13/2016   Last Assessment & Plan:  Marked weakness.  Will request further home assistance per home health with bathing.  Discharged on a supplement- patient reports is unaffordable.  Advised prefer food as no barriers, appetite reasonable, ok to use glucerna or carnation instant breakfast if desired.  . Pre-operative clearance 01/30/2017  . Protein-calorie malnutrition, severe 03/03/2016  . Right hip pain 09/27/2019   Last Assessment & Plan:  Formatting of this note might be different from the original.  Recent tests reassuring for no malignancy.  known hip arthritis.  Symptoms today separate from neuropathy.  He plans on starting program with trainer at [facility], declined PT today for gait and strength.  Will reassess at followup.  . Sinus tachycardia 05/13/2016  . SIRS (systemic inflammatory response syndrome) ([place]) 05/12/2016  . Stage 3  chronic kidney disease ([place]) 06/13/2016   Last Assessment & Plan:  Will check Cr/K today  June 2020, annual follow-up [place] [facility]  Last Assessment & Plan:   Recent labs reviewed- stable Formatting of this note might be different from the original. Last Assessment & Plan:  Will check Cr/K today  . Type 2 diabetes mellitus ([place]) 07/21/2011   Last Assessment & Plan:   Diabetes improved with fasting glucoses in a safe range.  Continue current regimen. Formatting of this note might be different from the original. Last Assessment & Plan:  Patient's diabetes has been well controlled at baseline on 12 units of Lantus daily.  He reports some hyperglycemia on the first few days after dexamethasone with resolution by the end of the week. We'll  . Upper abdominal pain   . Urinary retention   . Weakness generalized     . Past Surgical History:  Procedure Laterality Date  . CARDIAC CATHETERIZATION  2006  . CORONARY ANGIOPLASTY WITH STENT PLACEMENT  2007  . IR FLUORO GUIDE CV LINE RIGHT  05/20/2016  . IR US GUIDE VASC ACCESS  RIGHT  05/20/2016    . Social History   Tobacco Use  . Smoking status: Never Smoker  . Smokeless tobacco: Never Used  Vaping Use  . Vaping Use: Never used  Substance Use Topics  . Alcohol use: No  . Drug use: No    ALLERGIES:  has No Known Allergies.  MEDICATIONS:  Current Outpatient Medications  Medication Sig Dispense Refill  . amLODipine (NORVASC) 10 MG tablet Take 10 mg by mouth daily.    Douglas Rose aspirin 81 MG tablet Take 81 mg by mouth 2 (two) times daily. 2 tablets (162 mg) twice daily    . atorvastatin (LIPITOR) 20 MG tablet Take 1 tablet (20 mg total) by mouth daily. 90 tablet 3  . Blood Glucose Monitoring Suppl (ACCU-CHEK AVIVA PLUS) w/Device KIT by Does not apply route.    . Cholecalciferol 10 MCG (400 UNIT) CAPS Take 1 capsule by mouth daily.    Douglas Rose gabapentin (NEURONTIN) 300 MG capsule Take 300 mg by mouth.    . Insulin Glargine (LANTUS SOLOSTAR) 100 UNIT/ML  Solostar Pen Inject 20 Units into the skin daily. (Patient taking differently: Inject 16 Units into the skin daily. ) 15 mL 11  . lenalidomide (REVLIMID) 10 MG capsule TAKE 1 CAPSULE BY MOUTH  DAILY WITH WATER FOR 21  DAYS ON THEN 7 DAYS OFF 21 capsule 0  . lisinopril (ZESTRIL) 2.5 MG tablet Take 2.5 mg by mouth daily.    . metoprolol succinate (TOPROL-XL) 100 MG 24 hr tablet Take 100 mg by mouth daily.     . nitroGLYCERIN (NITROSTAT) 0.4 MG SL tablet Place 1 tablet (0.4 mg total) under the tongue every 5 (five) minutes x 3 doses as needed for chest pain. 30 tablet 12  . RELION PEN NEEDLES 32G X 4 MM MISC     . vitamin B-12 (CYANOCOBALAMIN) 1000 MCG tablet Take 1,000 mcg by mouth daily.     No current facility-administered medications for this visit.    PHYSICAL EXAMINATION: ECOG PERFORMANCE STATUS: 1 - Symptomatic but completely ambulatory  Vitals:   12/20/19 1010  BP: (!) 145/72  Pulse: 63  Resp: 16  Temp: 97.7 F (36.5 C)  SpO2: 100%    Filed Weights   12/20/19 1010  Weight: 172 lb 11.2 oz (78.3 kg)   .Body mass index is 24.09 kg/m.   GENERAL:alert, in no acute distress and comfortable SKIN: no acute rashes, no significant lesions EYES: conjunctiva are pink and non-injected, sclera anicteric OROPHARYNX: MMM, no exudates, no oropharyngeal erythema or ulceration NECK: supple, no JVD LYMPH:  no palpable lymphadenopathy in the axillary or inguinal regions. Right cervical lymphadenopathy. LUNGS: clear to auscultation b/l with normal respiratory effort HEART: regular rate & rhythm ABDOMEN:  normoactive bowel sounds , non tender, not distended. No palpable hepatosplenomegaly.  Extremity: no pedal edema PSYCH: alert & oriented x 3 with fluent speech NEURO: no focal motor/sensory deficits  LABORATORY DATA:   I have reviewed the data as listed  06/08/2018 MMP Component     Latest Ref Rng & Units 06/08/2018  IgG (Immunoglobin G), Serum     603 - 1,613 mg/dL 1,556  IgA      61 - 437 mg/dL 303  IgM (Immunoglobulin M), Srm     15 - 143 mg/dL 36  Total Protein ELP     6.0 - 8.5 g/dL 6.5  Albumin SerPl Elph-Mcnc     2.9 - 4.4 g/dL 3.2  Alpha 1     0.0 - 0.4  g/dL 0.2  Alpha2 Glob SerPl Elph-Mcnc     0.4 - 1.0 g/dL 0.8  B-Globulin SerPl Elph-Mcnc     0.7 - 1.3 g/dL 0.9  Gamma Glob SerPl Elph-Mcnc     0.4 - 1.8 g/dL 1.5  M Protein SerPl Elph-Mcnc     Not Observed g/dL Not Observed  Globulin, Total     2.2 - 3.9 g/dL 3.3  Albumin/Glob SerPl     0.7 - 1.7 1.0  IFE 1      Comment  Please Note (HCV):      Comment   . CBC Latest Ref Rng & Units 12/13/2019 10/10/2019 08/08/2019  WBC 4.0 - 10.5 K/uL 3.1(L) 3.5(L) 2.6(L)  Hemoglobin 13.0 - 17.0 g/dL 11.8(L) 11.0(L) 10.6(L)  Hematocrit 39.0 - 52.0 % 37.1(L) 34.4(L) 33.0(L)  Platelets 150 - 400 K/uL 222 198 152  ANC 1k  . CMP Latest Ref Rng & Units 12/13/2019 10/10/2019 08/08/2019  Glucose 70 - 99 mg/dL 121(H) 162(H) 98  BUN 8 - 23 mg/dL 18 24(H) 21  Creatinine 0.61 - 1.24 mg/dL 1.60(H) 1.44(H) 1.56(H)  Sodium 135 - 145 mmol/L 140 135 137  Potassium 3.5 - 5.1 mmol/L 4.0 4.1 3.5  Chloride 98 - 111 mmol/L 106 106 105  CO2 22 - 32 mmol/L 28 26 26   Calcium 8.9 - 10.3 mg/dL 9.1 9.0 9.0  Total Protein 6.5 - 8.1 g/dL 7.8 7.4 7.0  Total Bilirubin 0.3 - 1.2 mg/dL 0.3 0.3 0.4  Alkaline Phos 38 - 126 U/L 100 90 77  AST 15 - 41 U/L 23 23 12(L)  ALT 0 - 44 U/L 30 25 16        RADIOGRAPHIC STUDIES: I have personally reviewed the radiological images as listed and agreed with the findings in the report. No results found.  ASSESSMENT & PLAN:   74 y.o.  with multiple medical comorbidities but fairly good performance status overall with   1) ISS Stage II IgG Kapppa multiple myeloma. Anemia + Renal insufficiency.  Bone survey neg for concerning bone lesions. significantly elevated kappafree light chains 858, lambda 18.4, with a ratio of 46.65 . He was also noted to have an M spike of 1.7 g/dL with IFE showing IgG  kappa monoclonal protein. Quantitative IgG level was increased to nearly 2500 mg/dL. 24-hour UPEP showed monoclonal protein of 1300 mg per 24 hours which constituted about 86% of all the urinary protein. Bone marrow biopsy showed 20-30% Restricted plasma cells consistent with multiple myeloma Cytogenetics/FISH  Showed +4, +11, and +17   Patient has been treated with CyBord X 2 cycles . Treatment interrupted due to severe c diff colitis with prolonged hospitalization and decline in performance status. Patient is much improved and back to baseline now. M protein undetected with neg IFE Has been treated with 5 cycles of Rd  Currently on maintenance Rituxan and in Remission.  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 Had an element of obstructive uropathy due to BPH which has resolved. Creatinine continues to be stable.  3) Normocytic anemia likely related to multiple myeloma.-stable, improved. hgb 11.5 No abdominal pain or discomfort over the right upper quadrant. No fevers or chills.  PLAN:  -Discussed pt labwork, 12/13/19; Hgb has improved, other blood counts are steady, blood chemistries are steady, K/L light chain ratio is WNL, & no M Protein observed.  -Based on labs and clinical exam pt continues to be in remission from Multiple Myeloma at this time.  -The pt  has no prohibitive toxicities from continuing maintenance 10 mg Revlimid, three weeks on and one week off, at this time. -Advised pt that cold weather can exacerbate neuropathy. Recommend pt keep his hands warm.  -Continue 81 mg Aspirin daily  -Will get CT Soft Tissue Neck in 2 weeks  -Will see back in 3 weeks via phone -Will see back in 3 months, with labs 1 week prior   4) 5) DM2 - controlled -continue f/u with PCP.   FOLLOW UP: CT soft tissue neck in 2 weeks Phone visit with Dr Irene Limbo in 3 weeks Labs in 11 weeks RTC with Dr Irene Limbo in 12 weeks   The total time spent in the  appt was 20 minutes and more than 50% was on counseling and direct patient cares.  All of the patient's questions were answered with apparent satisfaction. The patient knows to call the clinic with any problems, questions or concerns.   Sullivan Lone MD [place] AAHIVMS [facility] Hematology/Oncology Physician [facility]  (Office):       [phone] (Work cell):  [phone] (Fax):           [phone]  I, Yevette Edwards, am acting as a scribe for Dr. Sullivan Lone.   .I have reviewed the above documentation for accuracy and completeness, and I agree with the above. Brunetta Genera MD

## 2019-12-20 NOTE — Telephone Encounter (Signed)
Scheduled appointments per 12/14 los. Spoke to patient who is aware of appointments dates and times.  

## 2019-12-27 ENCOUNTER — Ambulatory Visit: Payer: Medicare HMO | Admitting: Hematology

## 2020-01-03 ENCOUNTER — Other Ambulatory Visit: Payer: Self-pay

## 2020-01-03 ENCOUNTER — Ambulatory Visit (HOSPITAL_COMMUNITY)
Admission: RE | Admit: 2020-01-03 | Discharge: 2020-01-03 | Disposition: A | Discharge status: Home / Self Care | Payer: Medicare HMO | Source: Ambulatory Visit | Attending: Hematology | Admitting: Hematology

## 2020-01-03 DIAGNOSIS — R590 Localized enlarged lymph nodes: Secondary | ICD-10-CM | POA: Diagnosis present

## 2020-01-05 ENCOUNTER — Other Ambulatory Visit: Payer: Self-pay | Admitting: *Deleted

## 2020-01-05 DIAGNOSIS — C9000 Multiple myeloma not having achieved remission: Secondary | ICD-10-CM

## 2020-01-05 MED ORDER — LENALIDOMIDE 10 MG PO CAPS: ORAL_CAPSULE | ORAL | 0 refills | Status: DC

## 2020-01-09 ENCOUNTER — Encounter: Payer: Self-pay | Admitting: Hematology

## 2020-01-09 NOTE — Progress Notes (Signed)
Received physician form from [facility] to be signed by doctor.  Left with Sandi RN to have doctor sign and return to me to submit to [facility].

## 2020-01-10 ENCOUNTER — Telehealth: Payer: Self-pay | Admitting: Hematology

## 2020-01-10 NOTE — Telephone Encounter (Signed)
Contacted patient to verfify phone visit for pre reg

## 2020-01-11 ENCOUNTER — Inpatient Hospital Stay: Payer: Medicare HMO | Attending: Hematology | Admitting: Hematology

## 2020-01-11 DIAGNOSIS — D649 Anemia, unspecified: Secondary | ICD-10-CM

## 2020-01-11 DIAGNOSIS — C9001 Multiple myeloma in remission: Secondary | ICD-10-CM

## 2020-01-11 NOTE — Progress Notes (Signed)
[name]  HEMATOLOGY ONCOLOGY PROGRESS NOTE  Date of service: 01/11/20  Patient Care Team: [name], MD as PCP - General (Family Medicine)  CC: f/u for multiple myeloma  Diagnosis:  IgG Kappa multiple myeloma  Cervical Lymphadenopathy  Current Treatment:  Revlimid maintenance   INTERVAL HISTORY: I connected with  Douglas Rose. on 01/11/20 by telephone and verified that I am speaking with the correct person using two identifiers.   I discussed the limitations of evaluation and management by telemedicine. The patient expressed understanding and agreed to proceed.  Other persons participating in the visit and their role in the encounter:         - Douglas Rose, Pt's wife        -Yevette Edwards, [place], Medical Scribe  Patient's location: Home Provider's location: [facility]  Douglas Rose presents today for management and evaluation of his maintenance Revlimid for his Multiple Myeloma. The patient's last visit with [name] was on 12/20/2019. The pt reports that he is doing well overall.  The pt reports no new concerns/symptoms. He reported that he has a surgery on 01/19/2020 for a fatty lipoma removal. He notes that the enlarged lymph node has remained consistent in size over the last several months.   Of note since the patient's last visit, pt has had CT Soft Tissue Neck ([id number]) completed on 01/03/2020 with results revealing "1. 19 x 16 x 11 mm soft tissue nodule in the posterior right level 2 space likely represents a focal lymph node. If this node has been previously biopsied, it may be benign. No primary lesion is evident. 2. No other significant nodes are present in the neck. 3. 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 4. Multilevel degenerative changes of the cervical spine as described. 5. Aortic Atherosclerosis (ICD10-I70.0)."  On review of systems, pt reports no new symptoms.    REVIEW OF SYSTEMS:   A 10+ POINT REVIEW OF SYSTEMS WAS  OBTAINED including neurology, dermatology, psychiatry, cardiac, respiratory, lymph, extremities, GI, GU, Musculoskeletal, constitutional, breasts, reproductive, HEENT.  All pertinent positives are noted in the HPI.  All others are negative.   Past Medical History:  Diagnosis Date  . Abdominal fullness 02/26/2016   Last Assessment & Plan:  overall improving with no pain, no further nausea and normal BM today.  No signs of obstruction or infection today.  Due for 10 year colonoscopy, mild anemia in December, will refer to GI  . Acute renal failure ([place]) 03/06/2016  . Acute renal failure superimposed on chronic kidney disease ([place]) 03/01/2016  . AKI (acute kidney injury) ([place])   . Arterial insufficiency of lower extremity ([place]) 09/01/2016   Overview:  Mild (2018)  Last Assessment & Plan:  Mild arterial insufficiency, he does not have classic symptoms of claudication. Advised daily foot checks and would consider further evaluation if new or worsening symptoms  Mild (2018)  Last Assessment & Plan:  asymptomatic.  We will follow-up as needed Formatting of this note might be different from the original. Mild (2018)  Last Assessment & Pla  . CAD (coronary artery disease) 2006   stent LAD 3.0x18 [place], occluded RCA  . Cancer ([place])    multiple myeloma  . Cervical lymphadenopathy 03/31/2016  . Colitis 05/12/2016  . Coronary artery disease 07/21/2011   stent LAD 3.0x18 [place], occluded RCA  Overview:  Stent 2006 at [facility], Low risk stress test feb 2017  Last Assessment & Plan:  Declined further stress testing in 2015 due to cost and absence of symptoms.  Continues to be asymptomatic.  He has upcoming follow-up with cardiology for review. Encouraged to keep appt  Stent 2006 at [facility], Low risk s  . Diabetes mellitus without complication ([place])   . DNR (do not resuscitate) discussion   . Enlarged prostate 03/02/2016  . Enteritis due to Clostridium difficile   .  Epidermoid cyst of skin 03/31/2016  . Generalized abdominal pain   . H/O non-insulin dependent diabetes mellitus   . Hyperlipidemia   . Hypertension   . Infected tooth 03/31/2016  . Leukocytosis   . Lipoma 07/19/2014   Overview:  Right scalp.  Seen by Payton Mccallum in past  Last Assessment & Plan:  Lipoma on right scalp getting larger, will refer to ENT, but more concerned about lymph node  . Lymphadenopathy 11/21/2015   Last Assessment & Plan:  Right submandibular lymph node, non-tender, now larger. CBC 2 months ago showed mild anemia, normal peripheral smear.  Will recheck today along with ESR, sed rate.  Will refer to ENT for further evaluation.    Last Assessment & Plan:   Soft, right-sided postauricular mass, deferred intervention in 2018 at onset of myeloma diagnosis, gradually enlarging over time.  Had cons  . Monoclonal gammopathy   . Multiple myeloma ([place]) 03/20/2016   Last Assessment & Plan:  Given dx, rec social distancing, avoiding in person jury duty.  Letter provided Formatting of this note might be different from the original. Last Assessment & Plan:  Myeloma treatment is going well, he has upcoming oncology visit  . Palliative care by specialist   . Pancreatitis 03/02/2016  . Peripheral neuropathy 07/22/2016   Last Assessment & Plan:  Overall improved. Patient has discontinued gabapentin, did not feel much difference  Feet and fingers.  Multifactorial, DM, Chemo, MM  Last Assessment & Plan:   Doing well on gabapentin Formatting of this note might be different from the original. Last Assessment & Plan:  Overall improved. Patient has discontinued gabapentin, did not feel much difference  . Physical deconditioning 06/13/2016   Last Assessment & Plan:  Marked weakness.  Will request further home assistance per home health with bathing.  Discharged on a supplement- patient reports is unaffordable.  Advised prefer food as no barriers, appetite reasonable, ok to use glucerna or carnation instant breakfast  if desired.  . Pre-operative clearance 01/30/2017  . Protein-calorie malnutrition, severe 03/03/2016  . Right hip pain 09/27/2019   Last Assessment & Plan:  Formatting of this note might be different from the original.  Recent tests reassuring for no malignancy.  known hip arthritis.  Symptoms today separate from neuropathy.  He plans on starting program with trainer at [facility], declined PT today for gait and strength.  Will reassess at followup.  . Sinus tachycardia 05/13/2016  . SIRS (systemic inflammatory response syndrome) ([place]) 05/12/2016  . Stage 3 chronic kidney disease ([place]) 06/13/2016   Last Assessment & Plan:  Will check Cr/K today  June 2020, annual follow-up [place] kidney  Last Assessment & Plan:   Recent labs reviewed- stable Formatting of this note might be different from the original. Last Assessment & Plan:  Will check Cr/K today  . Type 2 diabetes mellitus ([place]) 07/21/2011   Last Assessment & Plan:   Diabetes improved with fasting glucoses in a safe range.  Continue current regimen. Formatting of this note might be different from the original.  Last Assessment & Plan:  Patient's diabetes has been well controlled at baseline on 12 units of Lantus daily.  He reports some hyperglycemia on the first few days after dexamethasone with resolution by the end of the week. We'll  . Upper abdominal pain   . Urinary retention   . Weakness generalized     . Past Surgical History:  Procedure Laterality Date  . CARDIAC CATHETERIZATION  2006  . CORONARY ANGIOPLASTY WITH STENT PLACEMENT  2007  . IR FLUORO GUIDE CV LINE RIGHT  05/20/2016  . IR US GUIDE VASC ACCESS RIGHT  05/20/2016    . Social History   Tobacco Use  . Smoking status: Never Smoker  . Smokeless tobacco: Never Used  Vaping Use  . Vaping Use: Never used  Substance Use Topics  . Alcohol use: No  . Drug use: No    ALLERGIES:  has No Known Allergies.  MEDICATIONS:  Current Outpatient Medications  Medication Sig Dispense Refill   . amLODipine (NORVASC) 10 MG tablet Take 10 mg by mouth daily.    [name] aspirin 81 MG tablet Take 81 mg by mouth 2 (two) times daily. 2 tablets (162 mg) twice daily    . atorvastatin (LIPITOR) 20 MG tablet Take 1 tablet (20 mg total) by mouth daily. 90 tablet 3  . Blood Glucose Monitoring Suppl (ACCU-CHEK AVIVA PLUS) w/Device KIT by Does not apply route.    . Cholecalciferol 10 MCG (400 UNIT) CAPS Take 1 capsule by mouth daily.    [name] gabapentin (NEURONTIN) 300 MG capsule Take 300 mg by mouth.    . Insulin Glargine (LANTUS SOLOSTAR) 100 UNIT/ML Solostar Pen Inject 20 Units into the skin daily. (Patient taking differently: Inject 16 Units into the skin daily. ) 15 mL 11  . lenalidomide (REVLIMID) 10 MG capsule TAKE 1 CAPSULE BY MOUTH  DAILY WITH WATER FOR 21  DAYS ON THEN 7 DAYS OFF 21 capsule 0  . lisinopril (ZESTRIL) 2.5 MG tablet Take 2.5 mg by mouth daily.    . metoprolol succinate (TOPROL-XL) 100 MG 24 hr tablet Take 100 mg by mouth daily.     . nitroGLYCERIN (NITROSTAT) 0.4 MG SL tablet Place 1 tablet (0.4 mg total) under the tongue every 5 (five) minutes x 3 doses as needed for chest pain. 30 tablet 12  . RELION PEN NEEDLES 32G X 4 MM MISC     . vitamin B-12 (CYANOCOBALAMIN) 1000 MCG tablet Take 1,000 mcg by mouth daily.     No current facility-administered medications for this visit.    PHYSICAL EXAMINATION: ECOG PERFORMANCE STATUS: 1 - Symptomatic but completely ambulatory  There were no vitals filed for this visit.  There were no vitals filed for this visit. .There is no height or weight on file to calculate BMI.   Telehealth visit 01/11/2020  LABORATORY DATA:   I have reviewed the data as listed  06/08/2018 MMP Component     Latest Ref Rng & Units 06/08/2018  IgG (Immunoglobin G), Serum     603 - 1,613 mg/dL 1,556  IgA     61 - 437 mg/dL 303  IgM (Immunoglobulin M), Srm     15 - 143 mg/dL 36  Total Protein ELP     6.0 - 8.5 g/dL 6.5  Albumin SerPl Elph-Mcnc     2.9 - 4.4  g/dL 3.2  Alpha 1     0.0 - 0.4 g/dL 0.2  Alpha2 Glob SerPl Elph-Mcnc     0.4 - 1.0  g/dL 0.8  B-Globulin SerPl Elph-Mcnc     0.7 - 1.3 g/dL 0.9  Gamma Glob SerPl Elph-Mcnc     0.4 - 1.8 g/dL 1.5  M Protein SerPl Elph-Mcnc     Not Observed g/dL Not Observed  Globulin, Total     2.2 - 3.9 g/dL 3.3  Albumin/Glob SerPl     0.7 - 1.7 1.0  IFE 1      Comment  Please Note (HCV):      Comment   . CBC Latest Ref Rng & Units 12/13/2019 10/10/2019 08/08/2019  WBC 4.0 - 10.5 K/uL 3.1(L) 3.5(L) 2.6(L)  Hemoglobin 13.0 - 17.0 g/dL 11.8(L) 11.0(L) 10.6(L)  Hematocrit 39.0 - 52.0 % 37.1(L) 34.4(L) 33.0(L)  Platelets 150 - 400 K/uL 222 198 152  ANC 1k  . CMP Latest Ref Rng & Units 12/13/2019 10/10/2019 08/08/2019  Glucose 70 - 99 mg/dL 121(H) 162(H) 98  BUN 8 - 23 mg/dL 18 24(H) 21  Creatinine 0.61 - 1.24 mg/dL 1.60(H) 1.44(H) 1.56(H)  Sodium 135 - 145 mmol/L 140 135 137  Potassium 3.5 - 5.1 mmol/L 4.0 4.1 3.5  Chloride 98 - 111 mmol/L 106 106 105  CO2 22 - 32 mmol/L _0 Calcium 8.9 - 10.3 mg/dL 9.1 9.0 9.0  Total Protein 6.5 - 8.1 g/dL 7.8 7.4 7.0  Total Bilirubin 0.3 - 1.2 mg/dL 0.3 0.3 0.4  Alkaline Phos 38 - 126 U/L 100 90 77  AST 15 - 41 U/L 23 23 12(L)  ALT 0 - 44 U/L _1 RADIOGRAPHIC STUDIES: I have personally reviewed the radiological images as listed and agreed with the findings in the report. CT Soft Tissue Neck Wo Contrast  Result Date: 01/03/2020 CLINICAL DATA:  Right cervical adenopathy. Known lipoma posterior to the right ear. EXAM: CT NECK WITHOUT CONTRAST TECHNIQUE: Multidetector CT imaging of the neck was performed following the standard protocol without intravenous contrast. COMPARISON:  None. FINDINGS: Pharynx and larynx: No focal mucosal or submucosal lesions are present. The nasopharynx and soft palate are limits. Base is normal. Vallecula and epiglottis are within normal limits. Aryepiglottic folds and piriform sinuses are clear. Vocal cords are  midline and symmetric. Trachea is clear. Salivary glands: Submandibular and parotid glands are within normal limits bilaterally. Thyroid: Normal Lymph nodes: Soft tissue nodule in the posterior right level 2 space measures 19 x 16 x 11 mm. No other significant nodes are present in the neck. A right postauricular lipoma measures 3.9 x 2.6 cm on axial images. Vascular: Atherosclerotic calcifications are present at the aortic arch and great vessel origins without aneurysm. Additional calcifications are present both carotid bifurcations without definite stenosis. Calcifications are present within the cavernous internal carotid arteries bilaterally. Limited intracranial: Within normal limits. Visualized orbits: The globes and orbits are within normal limits. Mastoids and visualized paranasal sinuses: The paranasal sinuses and mastoid air cells are clear. Skeleton: Advanced endplate degenerative changes present C4-5, C5-6, and C6-7. Slight anterolisthesis is present C2-3 with advanced facet hypertrophy, left greater than right. Mild degenerative anterolisthesis is also present C3-4. No focal lytic or blastic lesions are present. Upper chest: Lung apices are clear. Thoracic inlet is within normal limits. IMPRESSION: 1. 19 x 16 x 11 mm soft tissue nodule in the posterior right level 2 space likely represents a focal lymph node. If this node has been previously biopsied, it may be benign. No primary lesion is evident. 2. No other significant nodes are present in  the neck. 3. 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 4. Multilevel degenerative changes of the cervical spine as described. 5. Aortic Atherosclerosis (ICD10-I70.0). Electronically Signed   By: San Morelle M.D.   On: 01/03/2020 15:35    ASSESSMENT & PLAN:   75 y.o.  with multiple medical comorbidities but fairly good performance status overall with   1) ISS Stage II IgG Kapppa multiple myeloma. Anemia + Renal insufficiency.  Bone survey neg for  concerning bone lesions. significantly elevated kappafree light chains 858, lambda 18.4, with a ratio of 46.65 . He was also noted to have an M spike of 1.7 g/dL with IFE showing IgG kappa monoclonal protein. Quantitative IgG level was increased to nearly 2500 mg/dL. 24-hour UPEP showed monoclonal protein of 1300 mg per 24 hours which constituted about 86% of all the urinary protein. Bone marrow biopsy showed 20-30% Restricted plasma cells consistent with multiple myeloma Cytogenetics/FISH  Showed +4, +11, and +17   Patient has been treated with CyBord X 2 cycles . Treatment interrupted due to severe c diff colitis with prolonged hospitalization and decline in performance status. Patient is much improved and back to baseline now. M protein undetected with neg IFE Has been treated with 5 cycles of Rd  Currently on maintenance Rituxan and in Remission.  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 Had an element of obstructive uropathy due to BPH which has resolved. Creatinine continues to be stable.  3) Normocytic anemia likely related to multiple myeloma.-stable, improved. hgb 11.5 No abdominal pain or discomfort over the right upper quadrant. No fevers or chills.  PLAN:  -Discussed 01/03/2020 CT Soft Tissue Neck ([id number]) which revealed "1. 19 x 16 x 11 mm soft tissue nodule in the posterior right level 2 space likely represents a focal lymph node. If this node has been previously biopsied, it may be benign. No primary lesion is evident. 2. No other significant nodes are present in the neck. 3. 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 4. Multilevel degenerative changes of the cervical spine as described. 5. Aortic Atherosclerosis (ICD10-I70.0)." -Advised pt that lymph node does not appear necrotic or malignant on scans - likely benign. -Advised pt that Revlimid increases the risks of secondary malignancies.  -Discussed options for enlarged lymph  node: observe if resolving on it's own with rpt CT or Needle or Surgical LN Bx.  -Recommend pt forward CT images on disc for the Surgeon prior to the procedure to the 13th.  -Based on labs and clinical exam pt continues to be in remission from Multiple Myeloma at this time. -The pt has no prohibitive toxicities from continuing maintenance 10 mg Revlimid, three weeks on and one week off, at this time. -Continue 81 mg Aspirin daily  -Will see back as scheduled.  4) 5) DM2 - controlled -continue f/u with PCP.   FOLLOW UP: F/u as per scheduled appointments in march for labs and clinic followup.   The total time spent in the appt was 15 minutes and more than 50% was on counseling and direct patient cares.  All of the patient's questions were answered with apparent satisfaction. The patient knows to call the clinic with any problems, questions or concerns.   Sullivan Lone MD [place] AAHIVMS [facility] Hematology/Oncology Physician [facility]  (Office):       [phone] (Work cell):  [phone] (Fax):           [phone]  I, Yevette Edwards, am acting  as a scribe for Dr. Sullivan Lone.   .I have reviewed the above documentation for accuracy and completeness, and I agree with the above. Brunetta Genera MD

## 2020-01-12 ENCOUNTER — Encounter: Payer: Self-pay | Admitting: Hematology

## 2020-01-12 NOTE — Progress Notes (Signed)
Received signed physician form for PAF from RN. Faxed to PAF. Fax received ok per confirmation sheet. °

## 2020-02-15 ENCOUNTER — Other Ambulatory Visit: Payer: Self-pay | Admitting: Hematology

## 2020-02-15 DIAGNOSIS — C9000 Multiple myeloma not having achieved remission: Secondary | ICD-10-CM

## 2020-02-16 ENCOUNTER — Telehealth: Payer: Self-pay | Admitting: *Deleted

## 2020-02-16 NOTE — Telephone Encounter (Signed)
Patient spouse called to r/s appts: Lab 03/06/20 and [name] 03/13/20.  Patient requested reschedule for after 3/28. [name] informed. Per [name] - ok to r/s. Appts on 3/1 and 3/8 cancelled and schedule message sent

## 2020-02-17 ENCOUNTER — Telehealth: Payer: Self-pay | Admitting: Hematology

## 2020-02-17 NOTE — Telephone Encounter (Signed)
Scheduled appt per 2/10 sch msg - pt wife is aware of appt date and time

## 2020-03-06 ENCOUNTER — Other Ambulatory Visit: Payer: Medicare HMO

## 2020-03-13 ENCOUNTER — Other Ambulatory Visit: Payer: Self-pay | Admitting: Hematology

## 2020-03-13 ENCOUNTER — Ambulatory Visit: Payer: Medicare HMO | Admitting: Hematology

## 2020-03-13 DIAGNOSIS — C9000 Multiple myeloma not having achieved remission: Secondary | ICD-10-CM

## 2020-04-02 ENCOUNTER — Other Ambulatory Visit: Payer: Self-pay

## 2020-04-02 ENCOUNTER — Inpatient Hospital Stay: Payer: Medicare HMO | Attending: Hematology

## 2020-04-02 DIAGNOSIS — C9000 Multiple myeloma not having achieved remission: Secondary | ICD-10-CM

## 2020-04-02 DIAGNOSIS — C9001 Multiple myeloma in remission: Secondary | ICD-10-CM | POA: Diagnosis present

## 2020-04-02 DIAGNOSIS — Z79899 Other long term (current) drug therapy: Secondary | ICD-10-CM | POA: Diagnosis not present

## 2020-04-02 LAB — CBC WITH DIFFERENTIAL/PLATELET
Abs Immature Granulocytes: 0.01 10*3/uL (ref 0.00–0.07)
Basophils Absolute: 0 10*3/uL (ref 0.0–0.1)
Basophils Relative: 0 %
Eosinophils Absolute: 0.1 10*3/uL (ref 0.0–0.5)
Eosinophils Relative: 5 %
HCT: 35.9 % — ABNORMAL LOW (ref 39.0–52.0)
Hemoglobin: 11.4 g/dL — ABNORMAL LOW (ref 13.0–17.0)
Immature Granulocytes: 0 %
Lymphocytes Relative: 35 %
Lymphs Abs: 0.9 10*3/uL (ref 0.7–4.0)
MCH: 27.7 pg (ref 26.0–34.0)
MCHC: 31.8 g/dL (ref 30.0–36.0)
MCV: 87.3 fL (ref 80.0–100.0)
Monocytes Absolute: 0.2 10*3/uL (ref 0.1–1.0)
Monocytes Relative: 8 %
Neutro Abs: 1.4 10*3/uL — ABNORMAL LOW (ref 1.7–7.7)
Neutrophils Relative %: 52 %
Platelets: 186 10*3/uL (ref 150–400)
RBC: 4.11 MIL/uL — ABNORMAL LOW (ref 4.22–5.81)
RDW: 17.5 % — ABNORMAL HIGH (ref 11.5–15.5)
WBC: 2.6 10*3/uL — ABNORMAL LOW (ref 4.0–10.5)
nRBC: 0 % (ref 0.0–0.2)

## 2020-04-02 LAB — CMP (CANCER CENTER ONLY)
ALT: 21 U/L (ref 0–44)
AST: 17 U/L (ref 15–41)
Albumin: 3.6 g/dL (ref 3.5–5.0)
Alkaline Phosphatase: 86 U/L (ref 38–126)
Anion gap: 9 (ref 5–15)
BUN: 29 mg/dL — ABNORMAL HIGH (ref 8–23)
CO2: 24 mmol/L (ref 22–32)
Calcium: 8.6 mg/dL — ABNORMAL LOW (ref 8.9–10.3)
Chloride: 105 mmol/L (ref 98–111)
Creatinine: 1.58 mg/dL — ABNORMAL HIGH (ref 0.61–1.24)
GFR, Estimated: 45 mL/min — ABNORMAL LOW (ref >60)
Glucose, Bld: 107 mg/dL — ABNORMAL HIGH (ref 70–99)
Potassium: 3.9 mmol/L (ref 3.5–5.1)
Sodium: 138 mmol/L (ref 135–145)
Total Bilirubin: 0.3 mg/dL (ref 0.3–1.2)
Total Protein: 7.8 g/dL (ref 6.5–8.1)

## 2020-04-02 LAB — VITAMIN D 25 HYDROXY (VIT D DEFICIENCY, FRACTURES): Vit D, 25-Hydroxy: 51.48 ng/mL (ref 30–100)

## 2020-04-03 LAB — KAPPA/LAMBDA LIGHT CHAINS
Kappa free light chain: 57.4 mg/L — ABNORMAL HIGH (ref 3.3–19.4)
Kappa, lambda light chain ratio: 1.68 — ABNORMAL HIGH (ref 0.26–1.65)
Lambda free light chains: 34.2 mg/L — ABNORMAL HIGH (ref 5.7–26.3)

## 2020-04-04 LAB — MULTIPLE MYELOMA PANEL, SERUM
Albumin SerPl Elph-Mcnc: 3.4 g/dL (ref 2.9–4.4)
Albumin/Glob SerPl: 1 (ref 0.7–1.7)
Alpha 1: 0.2 g/dL (ref 0.0–0.4)
Alpha2 Glob SerPl Elph-Mcnc: 0.8 g/dL (ref 0.4–1.0)
B-Globulin SerPl Elph-Mcnc: 1 g/dL (ref 0.7–1.3)
Gamma Glob SerPl Elph-Mcnc: 1.7 g/dL (ref 0.4–1.8)
Globulin, Total: 3.7 g/dL (ref 2.2–3.9)
IgA: 401 mg/dL (ref 61–437)
IgG (Immunoglobin G), Serum: 1812 mg/dL — ABNORMAL HIGH (ref 603–1613)
IgM (Immunoglobulin M), Srm: 32 mg/dL (ref 15–143)
Total Protein ELP: 7.1 g/dL (ref 6.0–8.5)

## 2020-04-09 ENCOUNTER — Telehealth: Payer: Self-pay | Admitting: Hematology

## 2020-04-09 ENCOUNTER — Other Ambulatory Visit: Payer: Self-pay

## 2020-04-09 ENCOUNTER — Inpatient Hospital Stay: Payer: Medicare HMO | Attending: Hematology | Admitting: Hematology

## 2020-04-09 VITALS — BP 135/73 | HR 60 | Temp 97.9°F | Resp 15 | Ht 71.0 in | Wt 176.8 lb

## 2020-04-09 DIAGNOSIS — Z79899 Other long term (current) drug therapy: Secondary | ICD-10-CM | POA: Diagnosis not present

## 2020-04-09 DIAGNOSIS — C9001 Multiple myeloma in remission: Secondary | ICD-10-CM | POA: Diagnosis present

## 2020-04-09 NOTE — Telephone Encounter (Signed)
Scheduled per los. Gave avs and calendar  

## 2020-04-11 ENCOUNTER — Other Ambulatory Visit: Payer: Self-pay | Admitting: Hematology

## 2020-04-11 DIAGNOSIS — C9000 Multiple myeloma not having achieved remission: Secondary | ICD-10-CM

## 2020-04-15 NOTE — Progress Notes (Signed)
[name]  HEMATOLOGY ONCOLOGY PROGRESS NOTE  Date of service: 04/15/20  Patient Care Team: [name], MD as PCP - General (Family Medicine)  CC: f/u for multiple myeloma  Diagnosis:  IgG Kappa multiple myeloma  Cervical Lymphadenopathy  Current Treatment:  Revlimid maintenance   INTERVAL HISTORY: I connected with  [name]. on 04/15/20 by telephone and verified that I am speaking with the correct person using two identifiers.   I discussed the limitations of evaluation and management by telemedicine. The patient expressed understanding and agreed to proceed.  Other persons participating in the visit and their role in the encounter:         - Mrs. Tuccillo, Pt's wife        -Yevette Edwards, [place], Medical Scribe  Patient's location: Home Provider's location: [place] at [facility]  Mr. Goshorn presents today for management and evaluation of his maintenance Revlimid for his Multiple Myeloma. The patient's last visit with [name] was on 12/20/2019. The pt reports that he is doing well overall.  The pt reports no new concerns/symptoms. He reported that he has a surgery on 01/19/2020 for a fatty lipoma removal. He notes that the enlarged lymph node has remained consistent in size over the last several months.   Of note since the patient's last visit, pt has had CT Soft Tissue Neck ([id number]) completed on 01/03/2020 with results revealing "1. 19 x 16 x 11 mm soft tissue nodule in the posterior right level 2 space likely represents a focal lymph node. If this node has been previously biopsied, it may be benign. No primary lesion is evident. 2. No other significant nodes are present in the neck. 3. 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 4. Multilevel degenerative changes of the cervical spine as described. 5. Aortic Atherosclerosis (ICD10-I70.0)."  On review of systems, pt reports no new symptoms.    REVIEW OF SYSTEMS:   A 10+ POINT REVIEW OF SYSTEMS WAS  OBTAINED including neurology, dermatology, psychiatry, cardiac, respiratory, lymph, extremities, GI, GU, Musculoskeletal, constitutional, breasts, reproductive, HEENT.  All pertinent positives are noted in the HPI.  All others are negative.   Past Medical History:  Diagnosis Date  . Abdominal fullness 02/26/2016   Last Assessment & Plan:  overall improving with no pain, no further nausea and normal BM today.  No signs of obstruction or infection today.  Due for 10 year colonoscopy, mild anemia in December, will refer to GI  . Acute renal failure ([place]) 03/06/2016  . Acute renal failure superimposed on chronic kidney disease ([place]) 03/01/2016  . AKI (acute kidney injury) ([place])   . Arterial insufficiency of lower extremity ([place]) 09/01/2016   Overview:  Mild (2018)  Last Assessment & Plan:  Mild arterial insufficiency, he does not have classic symptoms of claudication. Advised daily foot checks and would consider further evaluation if new or worsening symptoms  Mild (2018)  Last Assessment & Plan:  asymptomatic.  We will follow-up as needed Formatting of this note might be different from the original. Mild (2018)  Last Assessment & Pla  . CAD (coronary artery disease) 2006   stent LAD 3.0x18 [place], occluded RCA  . Cancer ([place])    multiple myeloma  . Cervical lymphadenopathy 03/31/2016  . Colitis 05/12/2016  . Coronary artery disease 07/21/2011   stent LAD 3.0x18 [place], occluded RCA  Overview:  Stent 2006 at [facility], Low risk stress test feb 2017  Last Assessment & Plan:  Declined further stress testing in 2015 due to cost and absence of symptoms.  Continues to be asymptomatic.  He has upcoming follow-up with cardiology for review. Encouraged to keep appt  Stent 2006 at [facility], Low risk s  . Diabetes mellitus without complication ([place])   . DNR (do not resuscitate) discussion   . Enlarged prostate 03/02/2016  . Enteritis due to Clostridium difficile   .  Epidermoid cyst of skin 03/31/2016  . Generalized abdominal pain   . H/O non-insulin dependent diabetes mellitus   . Hyperlipidemia   . Hypertension   . Infected tooth 03/31/2016  . Leukocytosis   . Lipoma 07/19/2014   Overview:  Right scalp.  Seen by Payton Mccallum in past  Last Assessment & Plan:  Lipoma on right scalp getting larger, will refer to ENT, but more concerned about lymph node  . Lymphadenopathy 11/21/2015   Last Assessment & Plan:  Right submandibular lymph node, non-tender, now larger. CBC 2 months ago showed mild anemia, normal peripheral smear.  Will recheck today along with ESR, sed rate.  Will refer to ENT for further evaluation.    Last Assessment & Plan:   Soft, right-sided postauricular mass, deferred intervention in 2018 at onset of myeloma diagnosis, gradually enlarging over time.  Had cons  . Monoclonal gammopathy   . Multiple myeloma ([place]) 03/20/2016   Last Assessment & Plan:  Given dx, rec social distancing, avoiding in person jury duty.  Letter provided Formatting of this note might be different from the original. Last Assessment & Plan:  Myeloma treatment is going well, he has upcoming oncology visit  . Palliative care by specialist   . Pancreatitis 03/02/2016  . Peripheral neuropathy 07/22/2016   Last Assessment & Plan:  Overall improved. Patient has discontinued gabapentin, did not feel much difference  Feet and fingers.  Multifactorial, DM, Chemo, MM  Last Assessment & Plan:   Doing well on gabapentin Formatting of this note might be different from the original. Last Assessment & Plan:  Overall improved. Patient has discontinued gabapentin, did not feel much difference  . Physical deconditioning 06/13/2016   Last Assessment & Plan:  Marked weakness.  Will request further home assistance per home health with bathing.  Discharged on a supplement- patient reports is unaffordable.  Advised prefer food as no barriers, appetite reasonable, ok to use glucerna or carnation instant breakfast  if desired.  . Pre-operative clearance 01/30/2017  . Protein-calorie malnutrition, severe 03/03/2016  . Right hip pain 09/27/2019   Last Assessment & Plan:  Formatting of this note might be different from the original.  Recent tests reassuring for no malignancy.  known hip arthritis.  Symptoms today separate from neuropathy.  He plans on starting program with trainer at [facility], declined PT today for gait and strength.  Will reassess at followup.  . Sinus tachycardia 05/13/2016  . SIRS (systemic inflammatory response syndrome) ([place]) 05/12/2016  . Stage 3 chronic kidney disease ([place]) 06/13/2016   Last Assessment & Plan:  Will check Cr/K today  June 2020, annual follow-up [place] kidney  Last Assessment & Plan:   Recent labs reviewed- stable Formatting of this note might be different from the original. Last Assessment & Plan:  Will check Cr/K today  . Type 2 diabetes mellitus ([place]) 07/21/2011   Last Assessment & Plan:   Diabetes improved with fasting glucoses in a safe range.  Continue current regimen. Formatting of this note might be different from the original.  Last Assessment & Plan:  Patient's diabetes has been well controlled at baseline on 12 units of Lantus daily.  He reports some hyperglycemia on the first few days after dexamethasone with resolution by the end of the week. We'll  . Upper abdominal pain   . Urinary retention   . Weakness generalized     . Past Surgical History:  Procedure Laterality Date  . CARDIAC CATHETERIZATION  2006  . CORONARY ANGIOPLASTY WITH STENT PLACEMENT  2007  . IR FLUORO GUIDE CV LINE RIGHT  05/20/2016  . IR US GUIDE VASC ACCESS RIGHT  05/20/2016    . Social History   Tobacco Use  . Smoking status: Never Smoker  . Smokeless tobacco: Never Used  Vaping Use  . Vaping Use: Never used  Substance Use Topics  . Alcohol use: No  . Drug use: No    ALLERGIES:  has No Known Allergies.  MEDICATIONS:  Current Outpatient Medications  Medication Sig Dispense Refill   . amLODipine (NORVASC) 10 MG tablet Take 10 mg by mouth daily.    [name] aspirin 81 MG tablet Take 81 mg by mouth 2 (two) times daily. 2 tablets (162 mg) twice daily    . atorvastatin (LIPITOR) 20 MG tablet Take 1 tablet (20 mg total) by mouth daily. 90 tablet 3  . Blood Glucose Monitoring Suppl (ACCU-CHEK AVIVA PLUS) w/Device KIT by Does not apply route.    . Cholecalciferol 10 MCG (400 UNIT) CAPS Take 1 capsule by mouth daily.    [name] gabapentin (NEURONTIN) 300 MG capsule Take 300 mg by mouth.    . Insulin Glargine (LANTUS SOLOSTAR) 100 UNIT/ML Solostar Pen Inject 20 Units into the skin daily. (Patient taking differently: Inject 16 Units into the skin daily. ) 15 mL 11  . lenalidomide (REVLIMID) 10 MG capsule TAKE 1 CAPSULE BY MOUTH  DAILY WITH WATER FOR 21  DAYS ON THEN 7 DAYS OFF 21 capsule 0  . lisinopril (ZESTRIL) 2.5 MG tablet Take 2.5 mg by mouth daily.    . metoprolol succinate (TOPROL-XL) 100 MG 24 hr tablet Take 100 mg by mouth daily.     . nitroGLYCERIN (NITROSTAT) 0.4 MG SL tablet Place 1 tablet (0.4 mg total) under the tongue every 5 (five) minutes x 3 doses as needed for chest pain. 30 tablet 12  . RELION PEN NEEDLES 32G X 4 MM MISC     . vitamin B-12 (CYANOCOBALAMIN) 1000 MCG tablet Take 1,000 mcg by mouth daily.     No current facility-administered medications for this visit.    PHYSICAL EXAMINATION: ECOG PERFORMANCE STATUS: 1 - Symptomatic but completely ambulatory  Vitals:   04/09/20 1035  BP: 135/73  Pulse: 60  Resp: 15  Temp: 97.9 F (36.6 C)  SpO2: 100%    Filed Weights   04/09/20 1035  Weight: 176 lb 12.8 oz (80.2 kg)   .Body mass index is 24.66 kg/m.   Telehealth visit 04/15/2020  LABORATORY DATA:   I have reviewed the data as listed  06/08/2018 MMP Component     Latest Ref Rng & Units 06/08/2018  IgG (Immunoglobin G), Serum     603 - 1,613 mg/dL 1,556  IgA     61 - 437 mg/dL 303  IgM (Immunoglobulin M), Srm     15 - 143 mg/dL 36  Total Protein ELP      6.0 - 8.5 g/dL 6.5  Albumin SerPl Elph-Mcnc     2.9 - 4.4 g/dL 3.2  Alpha 1  0.0 - 0.4 g/dL 0.2  Alpha2 Glob SerPl Elph-Mcnc     0.4 - 1.0 g/dL 0.8  B-Globulin SerPl Elph-Mcnc     0.7 - 1.3 g/dL 0.9  Gamma Glob SerPl Elph-Mcnc     0.4 - 1.8 g/dL 1.5  M Protein SerPl Elph-Mcnc     Not Observed g/dL Not Observed  Globulin, Total     2.2 - 3.9 g/dL 3.3  Albumin/Glob SerPl     0.7 - 1.7 1.0  IFE 1      Comment  Please Note (HCV):      Comment   . CBC Latest Ref Rng & Units 04/02/2020 12/13/2019 10/10/2019  WBC 4.0 - 10.5 K/uL 2.6(L) 3.1(L) 3.5(L)  Hemoglobin 13.0 - 17.0 g/dL 11.4(L) 11.8(L) 11.0(L)  Hematocrit 39.0 - 52.0 % 35.9(L) 37.1(L) 34.4(L)  Platelets 150 - 400 K/uL 186 222 198  ANC 1k  . CMP Latest Ref Rng & Units 04/02/2020 12/13/2019 10/10/2019  Glucose 70 - 99 mg/dL 107(H) 121(H) 162(H)  BUN 8 - 23 mg/dL 29(H) 18 24(H)  Creatinine 0.61 - 1.24 mg/dL 1.58(H) 1.60(H) 1.44(H)  Sodium 135 - 145 mmol/L 138 140 135  Potassium 3.5 - 5.1 mmol/L 3.9 4.0 4.1  Chloride 98 - 111 mmol/L 105 106 106  CO2 22 - 32 mmol/L $RemoveB'24 28 26  'LDQjBNVB$ Calcium 8.9 - 10.3 mg/dL 8.6(L) 9.1 9.0  Total Protein 6.5 - 8.1 g/dL 7.8 7.8 7.4  Total Bilirubin 0.3 - 1.2 mg/dL 0.3 0.3 0.3  Alkaline Phos 38 - 126 U/L 86 100 90  AST 15 - 41 U/L $Remo'17 23 23  'jbuAn$ ALT 0 - 44 U/L $Remo'21 30 25       'NaciE$ RADIOGRAPHIC STUDIES: I have personally reviewed the radiological images as listed and agreed with the findings in the report. No results found.  ASSESSMENT & PLAN:   75 y.o.  with multiple medical comorbidities but fairly good performance status overall with   1) ISS Stage II IgG Kapppa multiple myeloma. Anemia + Renal insufficiency.  Bone survey neg for concerning bone lesions. significantly elevated kappafree light chains 858, lambda 18.4, with a ratio of 46.65 . He was also noted to have an M spike of 1.7 g/dL with IFE showing IgG kappa monoclonal protein. Quantitative IgG level was increased to nearly 2500  mg/dL. 24-hour UPEP showed monoclonal protein of 1300 mg per 24 hours which constituted about 86% of all the urinary protein. Bone marrow biopsy showed 20-30% Restricted plasma cells consistent with multiple myeloma Cytogenetics/FISH  Showed +4, +11, and +17   Patient has been treated with CyBord X 2 cycles . Treatment interrupted due to severe c diff colitis with prolonged hospitalization and decline in performance status. Patient is much improved and back to baseline now. M protein undetected with neg IFE Has been treated with 5 cycles of Rd  Currently on maintenance Rituxan and in Remission.  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 Had an element of obstructive uropathy due to BPH which has resolved. Creatinine continues to be stable.  3) Normocytic anemia likely related to multiple myeloma.-stable, improved. hgb 11.5 No abdominal pain or discomfort over the right upper quadrant. No fevers or chills.  PLAN:  -no prohibtive toxicities from Revlimid -upcming lipoma surgery  -Based on labs and clinical exam pt continues to be in remission from Multiple Myeloma at this time. -The pt has no prohibitive toxicities from continuing maintenance 10 mg Revlimid, three weeks on and one week off,  at this time. -Continue 81 mg Aspirin daily  -RTC with Dr Irene Limbo in 3 months. Plz schedule labs 1 week prior to clinic visit  4) 5) DM2 - controlled -continue f/u with PCP.   FOLLOW UP: RTC with Dr Irene Limbo in 3 months. Plz schedule labs 1 week prior to clinic visit   The total time spent in the appt was 20 minutes and more than 50% was on counseling and direct patient cares.  All of the patient's questions were answered with apparent satisfaction. The patient knows to call the clinic with any problems, questions or concerns.   Sullivan Lone MD [place] AAHIVMS [facility] Hematology/Oncology Physician [facility]  (Office):        [phone] (Work cell):  [phone] (Fax):           [phone]

## 2020-05-02 DIAGNOSIS — N400 Enlarged prostate without lower urinary tract symptoms: Secondary | ICD-10-CM

## 2020-05-02 DIAGNOSIS — I10 Essential (primary) hypertension: Secondary | ICD-10-CM

## 2020-05-02 DIAGNOSIS — Z860100 Personal history of colon polyps, unspecified: Secondary | ICD-10-CM

## 2020-05-02 DIAGNOSIS — E559 Vitamin D deficiency, unspecified: Secondary | ICD-10-CM | POA: Insufficient documentation

## 2020-05-02 DIAGNOSIS — Z1211 Encounter for screening for malignant neoplasm of colon: Secondary | ICD-10-CM

## 2020-05-02 DIAGNOSIS — I2510 Atherosclerotic heart disease of native coronary artery without angina pectoris: Secondary | ICD-10-CM | POA: Insufficient documentation

## 2020-05-02 HISTORY — DX: Essential (primary) hypertension: I10

## 2020-05-02 HISTORY — DX: Personal history of colon polyps, unspecified: Z86.0100

## 2020-05-02 HISTORY — DX: Personal history of colonic polyps: Z86.010

## 2020-05-02 HISTORY — DX: Encounter for screening for malignant neoplasm of colon: Z12.11

## 2020-05-02 HISTORY — DX: Atherosclerotic heart disease of native coronary artery without angina pectoris: I25.10

## 2020-05-02 HISTORY — DX: Vitamin D deficiency, unspecified: E55.9

## 2020-05-02 HISTORY — DX: Benign prostatic hyperplasia without lower urinary tract symptoms: N40.0

## 2020-05-09 ENCOUNTER — Other Ambulatory Visit: Payer: Self-pay | Admitting: Hematology

## 2020-05-10 ENCOUNTER — Ambulatory Visit: Payer: Medicare HMO | Admitting: Cardiology

## 2020-05-11 ENCOUNTER — Other Ambulatory Visit: Payer: Self-pay

## 2020-06-07 ENCOUNTER — Other Ambulatory Visit: Payer: Self-pay | Admitting: Hematology

## 2020-07-02 ENCOUNTER — Other Ambulatory Visit: Payer: Self-pay

## 2020-07-02 DIAGNOSIS — C9001 Multiple myeloma in remission: Secondary | ICD-10-CM

## 2020-07-02 DIAGNOSIS — C9000 Multiple myeloma not having achieved remission: Secondary | ICD-10-CM

## 2020-07-03 ENCOUNTER — Inpatient Hospital Stay: Payer: Medicare HMO | Attending: Hematology

## 2020-07-03 ENCOUNTER — Other Ambulatory Visit: Payer: Self-pay

## 2020-07-03 ENCOUNTER — Other Ambulatory Visit: Payer: Self-pay | Admitting: Hematology

## 2020-07-03 DIAGNOSIS — C9001 Multiple myeloma in remission: Secondary | ICD-10-CM | POA: Diagnosis not present

## 2020-07-03 LAB — CBC WITH DIFFERENTIAL (CANCER CENTER ONLY)
Abs Immature Granulocytes: 0.02 10*3/uL (ref 0.00–0.07)
Basophils Absolute: 0 10*3/uL (ref 0.0–0.1)
Basophils Relative: 0 %
Eosinophils Absolute: 0.2 10*3/uL (ref 0.0–0.5)
Eosinophils Relative: 6 %
HCT: 32.8 % — ABNORMAL LOW (ref 39.0–52.0)
Hemoglobin: 10.5 g/dL — ABNORMAL LOW (ref 13.0–17.0)
Immature Granulocytes: 1 %
Lymphocytes Relative: 34 %
Lymphs Abs: 1.2 10*3/uL (ref 0.7–4.0)
MCH: 28 pg (ref 26.0–34.0)
MCHC: 32 g/dL (ref 30.0–36.0)
MCV: 87.5 fL (ref 80.0–100.0)
Monocytes Absolute: 0.4 10*3/uL (ref 0.1–1.0)
Monocytes Relative: 12 %
Neutro Abs: 1.7 10*3/uL (ref 1.7–7.7)
Neutrophils Relative %: 47 %
Platelet Count: 160 10*3/uL (ref 150–400)
RBC: 3.75 MIL/uL — ABNORMAL LOW (ref 4.22–5.81)
RDW: 18.3 % — ABNORMAL HIGH (ref 11.5–15.5)
WBC Count: 3.6 10*3/uL — ABNORMAL LOW (ref 4.0–10.5)
nRBC: 0 % (ref 0.0–0.2)

## 2020-07-03 LAB — CMP (CANCER CENTER ONLY)
ALT: 17 U/L (ref 0–44)
AST: 14 U/L — ABNORMAL LOW (ref 15–41)
Albumin: 3.1 g/dL — ABNORMAL LOW (ref 3.5–5.0)
Alkaline Phosphatase: 91 U/L (ref 38–126)
Anion gap: 8 (ref 5–15)
BUN: 27 mg/dL — ABNORMAL HIGH (ref 8–23)
CO2: 25 mmol/L (ref 22–32)
Calcium: 8.7 mg/dL — ABNORMAL LOW (ref 8.9–10.3)
Chloride: 105 mmol/L (ref 98–111)
Creatinine: 1.94 mg/dL — ABNORMAL HIGH (ref 0.61–1.24)
GFR, Estimated: 35 mL/min — ABNORMAL LOW (ref >60)
Glucose, Bld: 166 mg/dL — ABNORMAL HIGH (ref 70–99)
Potassium: 3.7 mmol/L (ref 3.5–5.1)
Sodium: 138 mmol/L (ref 135–145)
Total Bilirubin: 0.4 mg/dL (ref 0.3–1.2)
Total Protein: 7.1 g/dL (ref 6.5–8.1)

## 2020-07-04 ENCOUNTER — Other Ambulatory Visit: Payer: Self-pay

## 2020-07-04 LAB — KAPPA/LAMBDA LIGHT CHAINS
Kappa free light chain: 64 mg/L — ABNORMAL HIGH (ref 3.3–19.4)
Kappa, lambda light chain ratio: 1.61 (ref 0.26–1.65)
Lambda free light chains: 39.7 mg/L — ABNORMAL HIGH (ref 5.7–26.3)

## 2020-07-05 LAB — MULTIPLE MYELOMA PANEL, SERUM
Albumin SerPl Elph-Mcnc: 3.2 g/dL (ref 2.9–4.4)
Albumin/Glob SerPl: 1.1 (ref 0.7–1.7)
Alpha 1: 0.2 g/dL (ref 0.0–0.4)
Alpha2 Glob SerPl Elph-Mcnc: 0.7 g/dL (ref 0.4–1.0)
B-Globulin SerPl Elph-Mcnc: 0.9 g/dL (ref 0.7–1.3)
Gamma Glob SerPl Elph-Mcnc: 1.4 g/dL (ref 0.4–1.8)
Globulin, Total: 3.2 g/dL (ref 2.2–3.9)
IgA: 335 mg/dL (ref 61–437)
IgG (Immunoglobin G), Serum: 1543 mg/dL (ref 603–1613)
IgM (Immunoglobulin M), Srm: 27 mg/dL (ref 15–143)
Total Protein ELP: 6.4 g/dL (ref 6.0–8.5)

## 2020-07-09 NOTE — Progress Notes (Signed)
Douglas Kitchen  HEMATOLOGY ONCOLOGY PROGRESS NOTE  Date of service: 07/10/20  Patient Care Team: [name], MD as PCP - General (Family Medicine)  Chief Complaint : f/u for multiple myeloma  Diagnosis:  IgG Kappa multiple myeloma  Cervical Lymphadenopathy  Current Treatment:  Revlimid maintenance   INTERVAL HISTORY:  Douglas Rose presents today for management and evaluation of his maintenance Revlimid for his Multiple Myeloma. The patient's last visit with [name] was on 04/09/2020. The pt reports that he is doing well overall.  The pt reports no new symptoms or concerns. He had his lipoma surgery and this went very well. He notes some cramping in his leg, more so in his left than right leg. This is worsened when he is resting, and happens at various times of the day. This happens once every 2-3 weeks. The pt notes he was playing basketball with his grandsons and had a fall on the pavement. He went to urgent care and had no injuries.   Lab results 07/03/2020 of CBC w/diff and CMP is as follows: all values are WNL except for WBC of 3.6K, RBC of 3.75, Hgb of 10.5, HCT of 32.8, RDW of 18.3, Glucose of 166, BUN of 27, Creatinine of 1.94, Calcium of 8.7, Albumin of 3.1, AST of 14, GFR est of 35. 07/03/2020 MMP WNL. 07/03/2020 Kappa free light chains of 64.0, Lambda free light chains of 39.7.  On review of systems, pt reports intermittent leg cramping, recent fall and denies decreased appetite, fatigue, diarrhea, new bone pains, leg swelling, abdominal pain, and any other symptoms.   REVIEW OF SYSTEMS:   10 Point review of Systems was done is negative except as noted above.  Past Medical History:  Diagnosis Date   Abdominal fullness 02/26/2016   Last Assessment & Plan:  overall improving with no pain, no further nausea and normal BM today.  No signs of obstruction or infection today.  Due for 10 year colonoscopy, mild anemia in December, will refer to GI   Acute renal failure ([place]) 03/06/2016    Acute renal failure superimposed on chronic kidney disease ([place]) 03/01/2016   AKI (acute kidney injury) ([place])    Arterial insufficiency of lower extremity ([place]) 09/01/2016   Overview:  Mild (2018)  Last Assessment & Plan:  Mild arterial insufficiency, he does not have classic symptoms of claudication. Advised daily foot checks and would consider further evaluation if new or worsening symptoms  Mild (2018)  Last Assessment & Plan:  asymptomatic.  We will follow-up as needed Formatting of this note might be different from the original. Mild (2018)  Last Assessment & Pla   Atherosclerotic heart disease of native coronary artery without angina pectoris 05/02/2020   Benign hypertension 07/21/2011   Last Assessment & Plan:  Formatting of this note might be different from the original.  We discussed blood pressure above goal today. Previously well controlled. I recommended he continue current medications, focus on lifestyle modifications including low-sodium diet and will reassess in 3 months   Benign prostatic hyperplasia 05/02/2020   CAD (coronary artery disease) 2006   stent LAD 3.0x18 [place], occluded RCA   Cancer ([place])    multiple myeloma   Cervical lymphadenopathy 03/31/2016   Colitis 05/12/2016   Coronary artery disease 07/21/2011   stent LAD 3.0x18 [place], occluded RCA  Overview:  Stent 2006 at [facility], Low risk stress test feb 2017  Last Assessment & Plan:  Declined further stress testing in 2015 due to cost and  absence of symptoms.  Continues to be asymptomatic.  He has upcoming follow-up with cardiology for review. 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  Patient is up-to-date on Covid booster   Infected tooth 03/31/2016   Leukocytosis    Lipoma 07/19/2014   Overview:  Right scalp.  Seen by Payton Mccallum in past  Last Assessment & Plan:  Lipoma on right scalp getting larger, will refer to ENT, but more concerned about lymph node   Lymphadenopathy 11/21/2015   Last Assessment & Plan:  Right submandibular lymph node, non-tender, now larger. CBC 2 months ago showed mild anemia, normal peripheral smear.  Will recheck today along with ESR, sed rate.  Will refer to ENT for further evaluation.    Last Assessment & Plan:   Soft, right-sided postauricular mass, deferred intervention in 2018 at onset of myeloma diagnosis, gradually enlarging over time.  Had cons   Monoclonal gammopathy    Multiple myeloma ([place]) 03/20/2016   Last Assessment & Plan:  Given dx, rec social distancing, avoiding in person jury duty.  Letter provided Formatting of this note might be different from the original. Last Assessment & Plan:  Myeloma treatment is going well, he has upcoming oncology visit   Multiple myeloma in remission ([place]) 07/22/2016   Last Assessment & Plan:  Formatting of this note might be different from the original.  Patient remains in remission, followed by oncology.  Had labs drawn today   Palliative care by specialist    Pancreatitis 03/02/2016   Peripheral neuropathy 07/22/2016   Last Assessment & Plan:  Overall improved. Patient has discontinued gabapentin, did not feel much difference  Feet and fingers.  Multifactorial, DM, Chemo, MM  Last Assessment & Plan:   Doing well on gabapentin Formatting of this note might be different from the original. Last Assessment & Plan:  Overall improved. Patient has discontinued gabapentin, did not feel  much difference   Physical deconditioning 06/13/2016   Last Assessment & Plan:  Marked weakness.  Will request further home assistance per home health with bathing.  Discharged on a supplement- patient reports is unaffordable.  Advised prefer food as no barriers, appetite reasonable, ok to use glucerna or carnation instant breakfast if desired.   Pre-operative clearance 01/30/2017   Protein-calorie malnutrition, severe 03/03/2016   Right hip pain 09/27/2019   Last Assessment & Plan:  Formatting of this note might be different from the original.  Recent tests reassuring for no malignancy.  known hip arthritis.  Symptoms today separate from neuropathy.  He plans on starting program with trainer at [facility], declined PT today for gait and strength.  Will reassess at followup.   Screening for malignant neoplasm of colon 05/02/2020   Sinus tachycardia 05/13/2016   SIRS (systemic inflammatory response syndrome) ([place]) 05/12/2016   Stage 3 chronic kidney disease ([place]) 06/13/2016   Last Assessment & Plan:  Will check Cr/K today  June 2020, annual follow-up [place] [facility]  Last Assessment & Plan:   Recent labs reviewed- stable Formatting of this note might be different from the original. Last Assessment & Plan:  Will check Cr/K today   Type 2 diabetes mellitus ([place]) 07/21/2011   Last Assessment & Plan:   Diabetes improved with fasting glucoses in a safe range.  Continue current regimen. Formatting of this note might be different from the original. Last Assessment & Plan:  Patient's diabetes has been well controlled at baseline on 12 units of Lantus daily.  He reports some hyperglycemia on the first few days after dexamethasone with resolution by the end of the week. We'll   Upper abdominal pain    Urinary retention    Vitamin D deficiency 05/02/2020   Weakness generalized     . Past Surgical History:  Procedure Laterality Date   CARDIAC CATHETERIZATION  2006   CORONARY ANGIOPLASTY WITH STENT PLACEMENT  2007   IR  FLUORO GUIDE CV LINE RIGHT  05/20/2016   IR US GUIDE VASC ACCESS RIGHT  05/20/2016    . Social History   Tobacco Use   Smoking status: Never   Smokeless tobacco: Never  Vaping Use   Vaping Use: Never used  Substance Use Topics   Alcohol use: No   Drug use: No    ALLERGIES:  has No Known Allergies.  MEDICATIONS:  Current Outpatient Medications  Medication Sig Dispense Refill   amLODipine (NORVASC) 10 MG tablet Take 10 mg by mouth daily.     aspirin 81 MG tablet Take 81 mg by mouth 2 (two) times daily.     atorvastatin (LIPITOR) 20 MG tablet Take 1 tablet (20 mg total) by mouth daily. 90 tablet 3   Cholecalciferol 10 MCG (400 UNIT) CAPS Take 1 capsule by mouth daily.     gabapentin (NEURONTIN) 300 MG capsule Take 300 mg by mouth daily.     Insulin Glargine (LANTUS SOLOSTAR) 100 UNIT/ML Solostar Pen Inject 20 Units into the skin daily. (Patient taking differently: Inject 16 Units into the skin daily. ) 15 mL 11   lisinopril (ZESTRIL) 2.5 MG tablet Take 2.5 mg by mouth daily.     metoprolol succinate (TOPROL-XL) 100 MG 24 hr tablet Take 100 mg by mouth daily.      nitroGLYCERIN (NITROSTAT) 0.4 MG SL tablet Place 1 tablet (0.4 mg total) under the tongue every 5 (five) minutes x 3 doses as needed for chest pain. 30 tablet 12   REVLIMID 10 MG capsule TAKE 1 CAPSULE BY MOUTH  DAILY WITH WATER FOR 21  DAYS ON THEN 7 DAYS OFF 21 capsule 0   vitamin B-12 (CYANOCOBALAMIN) 1000 MCG tablet Take 1,000 mcg by mouth daily.     No current facility-administered medications for this visit.    PHYSICAL EXAMINATION: ECOG PERFORMANCE STATUS: 1 - Symptomatic but completely ambulatory  Vitals:   07/10/20 1542  BP: 127/70  Pulse: 84  Resp: 18  Temp: 98.7 F (37.1 C)  SpO2: 100%     Filed Weights   07/10/20 1542  Weight: 173 lb 6.4 oz (78.7 kg)    .Body mass index is 24.18 kg/m.   NAD. GENERAL:alert, in no acute distress and comfortable SKIN: no acute rashes, no significant  lesions EYES: conjunctiva are pink and non-injected, sclera anicteric OROPHARYNX: MMM, no exudates, no oropharyngeal erythema or ulceration NECK: supple, no JVD LYMPH:  no palpable lymphadenopathy in the cervical, axillary or inguinal regions LUNGS: clear to auscultation b/l with  normal respiratory effort HEART: regular rate & rhythm ABDOMEN:  normoactive bowel sounds , non tender, not distended. Extremity: no pedal edema PSYCH: alert & oriented x 3 with fluent speech NEURO: no focal motor/sensory deficits  LABORATORY DATA:   I have reviewed the data as listed  06/08/2018 MMP Component     Latest Ref Rng & Units 06/08/2018  IgG (Immunoglobin G), Serum     603 - 1,613 mg/dL 1,556  IgA     61 - 437 mg/dL 303  IgM (Immunoglobulin M), Srm     15 - 143 mg/dL 36  Total Protein ELP     6.0 - 8.5 g/dL 6.5  Albumin SerPl Elph-Mcnc     2.9 - 4.4 g/dL 3.2  Alpha 1     0.0 - 0.4 g/dL 0.2  Alpha2 Glob SerPl Elph-Mcnc     0.4 - 1.0 g/dL 0.8  B-Globulin SerPl Elph-Mcnc     0.7 - 1.3 g/dL 0.9  Gamma Glob SerPl Elph-Mcnc     0.4 - 1.8 g/dL 1.5  M Protein SerPl Elph-Mcnc     Not Observed g/dL Not Observed  Globulin, Total     2.2 - 3.9 g/dL 3.3  Albumin/Glob SerPl     0.7 - 1.7 1.0  IFE 1      Comment  Please Note (HCV):      Comment   . CBC Latest Ref Rng & Units 07/03/2020 04/02/2020 12/13/2019  WBC 4.0 - 10.5 K/uL 3.6(L) 2.6(L) 3.1(L)  Hemoglobin 13.0 - 17.0 g/dL 10.5(L) 11.4(L) 11.8(L)  Hematocrit 39.0 - 52.0 % 32.8(L) 35.9(L) 37.1(L)  Platelets 150 - 400 K/uL 160 186 222  ANC 1k  . CMP Latest Ref Rng & Units 07/03/2020 04/02/2020 12/13/2019  Glucose 70 - 99 mg/dL 166(H) 107(H) 121(H)  BUN 8 - 23 mg/dL 27(H) 29(H) 18  Creatinine 0.61 - 1.24 mg/dL 1.94(H) 1.58(H) 1.60(H)  Sodium 135 - 145 mmol/L 138 138 140  Potassium 3.5 - 5.1 mmol/L 3.7 3.9 4.0  Chloride 98 - 111 mmol/L 105 105 106  CO2 22 - 32 mmol/L 25 24 28   Calcium 8.9 - 10.3 mg/dL 8.7(L) 8.6(L) 9.1  Total Protein 6.5  - 8.1 g/dL 7.1 7.8 7.8  Total Bilirubin 0.3 - 1.2 mg/dL 0.4 0.3 0.3  Alkaline Phos 38 - 126 U/L 91 86 100  AST 15 - 41 U/L 14(L) 17 23  ALT 0 - 44 U/L 17 21 30        RADIOGRAPHIC STUDIES: I have personally reviewed the radiological images as listed and agreed with the findings in the report. No results found.  ASSESSMENT & PLAN:   75 y.o.  with multiple medical comorbidities but fairly good performance status overall with   1) ISS Stage II IgG [place] multiple myeloma. Anemia + Renal insufficiency.  Bone survey neg for concerning bone lesions. significantly elevated kappa free light chains 858, lambda 18.4, with a ratio of 46.65 . He was also noted to have an M spike of 1.7 g/dL with IFE showing IgG kappa monoclonal protein. Quantitative IgG level was increased to nearly 2500 mg/dL. 24-hour UPEP showed monoclonal protein of 1300 mg per 24 hours which constituted about 86% of all the urinary protein. Bone marrow biopsy showed 20-30% Restricted plasma cells consistent with multiple myeloma Cytogenetics/FISH  Showed +4, +11, and +17   Patient has been treated with CyBord X 2 cycles . Treatment interrupted due to severe c diff colitis with prolonged hospitalization and decline in performance status.  Patient is much improved and back to baseline now. M protein undetected with neg IFE Has been treated with 5 cycles of Rd  Currently on maintenance Rituxan and in Remission.  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 Had an element of obstructive uropathy due to BPH which has resolved. Creatinine continues to be stable.   3) Normocytic anemia likely related to multiple myeloma.-stable, improved. hgb 11.5 No abdominal pain or discomfort over the right upper quadrant. No fevers or chills.  PLAN:  -Discussed pt labwork, 07/03/2020; countas and chemistries stable. MMP and light chains still normal. -Recommended pt start OTC Magnesium to help  with the muscle cramping. -Continue Vitamin D.  -Recommended pt increase his daily intake of water due to mild dehydration. -The pt has no prohibitive toxicities from continuing maintenance 10 mg Revlimid, three weeks on and one week off, at this time. -Continue 81 mg Aspirin daily  -Will see back in 3 months with labs 1 week prior.   4) DM2 - controlled -continue f/u with PCP.   FOLLOW UP: RTC with Dr Irene Limbo in 3 months. Plz schedule labs 1 week prior to clinic visit   The total time spent in the appt was 25 minutes and more than 50% was on counseling and direct patient cares.  All of the patient's questions were answered with apparent satisfaction. The patient knows to call the clinic with any problems, questions or concerns.   Sullivan Lone MD [place] AAHIVMS [facility] Hematology/Oncology Physician [facility]  (Office):       [phone] (Work cell):  [phone] (Fax):           [phone]  I, Reinaldo Raddle, am acting as scribe for Dr. Sullivan Lone, MD.   .I have reviewed the above documentation for accuracy and completeness, and I agree with the above. Brunetta Genera MD

## 2020-07-10 ENCOUNTER — Inpatient Hospital Stay: Payer: Medicare HMO | Attending: Hematology | Admitting: Hematology

## 2020-07-10 ENCOUNTER — Other Ambulatory Visit: Payer: Self-pay

## 2020-07-10 VITALS — BP 127/70 | HR 84 | Temp 98.7°F | Resp 18 | Wt 173.4 lb

## 2020-07-10 DIAGNOSIS — C9001 Multiple myeloma in remission: Secondary | ICD-10-CM

## 2020-07-10 DIAGNOSIS — Z79899 Other long term (current) drug therapy: Secondary | ICD-10-CM | POA: Diagnosis not present

## 2020-07-24 ENCOUNTER — Ambulatory Visit (INDEPENDENT_AMBULATORY_CARE_PROVIDER_SITE_OTHER): Payer: Medicare HMO | Admitting: Cardiology

## 2020-07-24 ENCOUNTER — Other Ambulatory Visit: Payer: Self-pay

## 2020-07-24 ENCOUNTER — Encounter: Payer: Self-pay | Admitting: Cardiology

## 2020-07-24 VITALS — BP 136/79 | HR 80 | Ht 71.0 in | Wt 173.1 lb

## 2020-07-24 DIAGNOSIS — E782 Mixed hyperlipidemia: Secondary | ICD-10-CM | POA: Diagnosis not present

## 2020-07-24 DIAGNOSIS — I10 Essential (primary) hypertension: Secondary | ICD-10-CM | POA: Diagnosis not present

## 2020-07-24 DIAGNOSIS — I2510 Atherosclerotic heart disease of native coronary artery without angina pectoris: Secondary | ICD-10-CM

## 2020-07-24 NOTE — Patient Instructions (Signed)

## 2020-07-24 NOTE — Progress Notes (Signed)
Cardiology Office Note:    Date:  07/24/2020   ID:  Douglas Rose., DOB 1945-02-22, MRN [id number]  PCP:  Douglas Mires, MD  Cardiologist:  Douglas Lindau, MD   Referring MD: Douglas Mires, MD    ASSESSMENT:    1. Coronary artery disease involving native coronary artery of native heart without angina pectoris   2. Mixed hyperlipidemia   3. Essential hypertension    PLAN:    In order of problems listed above:  Coronary artery disease: Secondary prevention stressed to the patient.  Importance of compliance with diet medication stressed any vocalized understanding.  He was advised to walk or use a stationary bicycle to the best of his ability and he agrees to do so. Essential hypertension: Blood pressure stable and diet was emphasized.  Lifestyle modification urged. Mixed dyslipidemia: He has not had lipid checks in the past few months.  I urged him to do so.  He prefers to go to his primary doctor for the same and we will continue to monitor telemetry.  I respect his wishes.  He will send [name] a copy of those numbers when they are available to him. Patient will be seen in follow-up appointment in 6 months or earlier if the patient has any concerns    Medication Adjustments/Labs and Tests Ordered: Current medicines are reviewed at length with the patient today.  Concerns regarding medicines are outlined above.  Orders Placed This Encounter  Procedures   EKG 12-Lead   No orders of the defined types were placed in this encounter.    No chief complaint on file.    History of Present Illness:    Douglas Rose. is a 75 y.o. male.  Patient has past medical history of coronary artery disease, essential hypertension and dyslipidemia.  He denies any problems at this time and takes care of activities of daily living.  No chest pain orthopnea or PND.  He is an active gentleman but has issues with neuropathy.  At the time of my evaluation, the patient is alert awake oriented  and in no distress.  Past Medical History:  Diagnosis Date   Abdominal fullness 02/26/2016   Last Assessment & Plan:  overall improving with no pain, no further nausea and normal BM today.  No signs of obstruction or infection today.  Due for 10 year colonoscopy, mild anemia in December, will refer to GI   Acute renal failure ([place]) 03/06/2016   Acute renal failure superimposed on chronic kidney disease ([place]) 03/01/2016   AKI (acute kidney injury) ([place])    Arterial insufficiency of lower extremity ([place]) 09/01/2016   Overview:  Mild (2018)  Last Assessment & Plan:  Mild arterial insufficiency, he does not have classic symptoms of claudication. Advised daily foot checks and would consider further evaluation if new or worsening symptoms  Mild (2018)  Last Assessment & Plan:  asymptomatic.  We will follow-up as needed Formatting of this note might be different from the original. Mild (2018)  Last Assessment & Pla   Atherosclerotic heart disease of native coronary artery without angina pectoris 05/02/2020   Benign hypertension 07/21/2011   Last Assessment & Plan:  Formatting of this note might be different from the original.  We discussed blood pressure above goal today. Previously well controlled. I recommended he continue current medications, focus on lifestyle modifications including low-sodium diet and will reassess in 3 months   Benign prostatic hyperplasia 05/02/2020   CAD (coronary artery disease) 2006  stent LAD 3.0x18 [place], occluded RCA   Cancer ([place])    multiple myeloma   Cervical lymphadenopathy 03/31/2016   Colitis 05/12/2016   Coronary artery disease 07/21/2011   stent LAD 3.0x18 [place], occluded RCA  Overview:  Stent 2006 at [facility], Low risk stress test feb 2017  Last Assessment & Plan:  Declined further stress testing in 2015 due to cost and absence of symptoms.  Continues to be asymptomatic.  He has upcoming follow-up with cardiology for review. 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  Patient is up-to-date on Covid booster   Infected tooth 03/31/2016   Leukocytosis    Lipoma 07/19/2014   Overview:  Right scalp.  Seen by Payton Mccallum in past  Last Assessment & Plan:  Lipoma on right scalp getting larger, will refer to ENT, but more concerned about lymph node   Lymphadenopathy 11/21/2015   Last Assessment & Plan:  Right submandibular lymph node, non-tender, now larger. CBC 2 months ago showed mild anemia, normal peripheral smear.  Will recheck today along with ESR, sed rate.  Will refer to ENT for further evaluation.    Last Assessment & Plan:   Soft, right-sided postauricular mass, deferred intervention in 2018 at onset of myeloma diagnosis, gradually enlarging over time.  Had cons   Monoclonal gammopathy    Multiple myeloma ([place]) 03/20/2016   Last Assessment & Plan:  Given dx, rec social distancing, avoiding in person jury duty.  Letter provided Formatting of this note might be different from the original. Last Assessment & Plan:  Myeloma treatment is going well, he has upcoming oncology visit   Multiple myeloma in remission ([place]) 07/22/2016   Last Assessment & Plan:  Formatting of this note might be different from the original.  Patient remains in remission, followed by oncology.  Had labs drawn today   Palliative care by specialist    Pancreatitis 03/02/2016   Peripheral neuropathy 07/22/2016   Last  Assessment & Plan:  Overall improved. Patient has discontinued gabapentin, did not feel much difference  Feet and fingers.  Multifactorial, DM, Chemo, MM  Last Assessment & Plan:   Doing well on gabapentin Formatting of this note might be different from the original. Last Assessment & Plan:  Overall improved. Patient has discontinued gabapentin, did not feel much difference   Physical deconditioning 06/13/2016   Last Assessment & Plan:  Marked weakness.  Will request further home assistance per home health with bathing.  Discharged on a supplement- patient reports is unaffordable.  Advised prefer food as no barriers, appetite reasonable, ok to use glucerna or carnation instant breakfast if desired.   Pre-operative clearance 01/30/2017   Protein-calorie malnutrition, severe 03/03/2016   Right hip pain 09/27/2019   Last Assessment & Plan:  Formatting of this note might be different from the original.  Recent tests reassuring for no malignancy.  known hip arthritis.  Symptoms today separate from neuropathy.  He plans on starting program with trainer [facility], declined PT today for gait and strength.  Will reassess at followup.   Screening for malignant neoplasm of colon 05/02/2020   Sinus tachycardia 05/13/2016   SIRS (systemic inflammatory response syndrome) ([place]) 05/12/2016   Stage 3 chronic kidney disease ([place]) 06/13/2016   Last Assessment & Plan:  Will check Cr/K today  June 2020, annual follow-up [place] [facility]  Last Assessment & Plan:   Recent labs reviewed- stable Formatting of this note might be different from the original. Last Assessment & Plan:  Will check Cr/K today   Type 2 diabetes mellitus ([place]) 07/21/2011   Last Assessment & Plan:   Diabetes improved with fasting glucoses in a safe range.  Continue current regimen. Formatting of this note might be different from the original. Last Assessment & Plan:  Patient's diabetes has been well controlled at baseline on 12 units of Lantus daily.  He reports some  hyperglycemia on the first few days after dexamethasone with resolution by the end of the week. We'll   Upper abdominal pain    Urinary retention    Vitamin D deficiency 05/02/2020   Weakness generalized     Past Surgical History:  Procedure Laterality Date   CARDIAC CATHETERIZATION  2006   CORONARY ANGIOPLASTY WITH STENT PLACEMENT  2007   IR FLUORO GUIDE CV LINE RIGHT  05/20/2016   IR US GUIDE VASC ACCESS RIGHT  05/20/2016    Current Medications: Current Meds  Medication Sig   amLODipine (NORVASC) 10 MG tablet Take 10 mg by mouth daily.   aspirin 81 MG tablet Take 81 mg by mouth 2 (two) times daily.   atorvastatin (LIPITOR) 20 MG tablet Take 1 tablet (20 mg total) by mouth daily.   Cholecalciferol 10 MCG (400 UNIT) CAPS Take 1 capsule by mouth daily.   gabapentin (NEURONTIN) 300 MG capsule Take 300 mg by mouth daily.   insulin glargine (LANTUS) 100 UNIT/ML injection Inject 16 Units into the skin daily.   lisinopril (ZESTRIL) 2.5 MG tablet Take 2.5 mg by mouth daily.   metoprolol succinate (TOPROL-XL) 100 MG 24 hr tablet Take 100 mg by mouth daily.    nitroGLYCERIN (NITROSTAT) 0.4 MG SL tablet Place 1 tablet (0.4 mg total) under the tongue every 5 (five) minutes x 3 doses as needed for chest pain.   REVLIMID 10 MG capsule TAKE 1 CAPSULE BY MOUTH  DAILY WITH WATER FOR 21  DAYS ON THEN 7 DAYS OFF   vitamin B-12 (CYANOCOBALAMIN) 1000 MCG tablet Take 1,000 mcg by mouth daily.     Allergies:   Patient has no known allergies.   Social History   Socioeconomic History   Marital status: Married    Spouse name: Not on file   Number of children: 6   Years of education: Not on file   Highest education level: Not on file  Occupational History   Occupation: retired Company secretary  Tobacco Use   Smoking status: Never   Smokeless tobacco: Never  Vaping Use   Vaping Use: Never used  Substance and Sexual Activity   Alcohol use: No   Drug use: No   Sexual activity: Not Currently  Other Topics  Concern   Not on file  Social History Narrative   Not on file   Social Determinants of Health   Financial Resource Strain: Not on file  Food Insecurity: Not on file  Transportation Needs: Not on file  Physical  Activity: Not on file  Stress: Not on file  Social Connections: Not on file     Family History: The patient's family history includes Arrhythmia in his mother; Cancer in his brother; Diabetes in his brother, brother, and sister; Heart disease in his mother; Stroke in his father.  ROS:   Please see the history of present illness.    All other systems reviewed and are negative.  EKGs/Labs/Other Studies Reviewed:    The following studies were reviewed today: I discussed my findings with the patient at length.  EKG reveals sinus rhythm and nonspecific ST changes   Recent Labs: 07/03/2020: ALT 17; BUN 27; Creatinine 1.94; Hemoglobin 10.5; Platelet Count 160; Potassium 3.7; Sodium 138  Recent Lipid Panel    Component Value Date/Time   CHOL 134 04/03/2017 1125   TRIG 101 04/03/2017 1125   HDL 50 04/03/2017 1125   CHOLHDL 2.7 04/03/2017 1125   CHOLHDL 4 01/11/2013 1030   VLDL 16.8 01/11/2013 1030   LDLCALC 64 04/03/2017 1125    Physical Exam:    VS:  BP 136/79   Pulse 80   Ht _0  (1.803 m)   Wt 173 lb 1.3 oz (78.5 kg)   SpO2 99%   BMI 24.14 kg/m     Wt Readings from Last 3 Encounters:  07/24/20 173 lb 1.3 oz (78.5 kg)  10/21/19 172 lb (78 kg)  05/11/19 176 lb (79.8 kg)     GEN: Patient is in no acute distress HEENT: Normal NECK: No JVD; No carotid bruits LYMPHATICS: No lymphadenopathy CARDIAC: Hear sounds regular, 2/6 systolic murmur at the apex. RESPIRATORY:  Clear to auscultation without rales, wheezing or rhonchi  ABDOMEN: Soft, non-tender, non-distended MUSCULOSKELETAL:  No edema; No deformity  SKIN: Warm and dry NEUROLOGIC:  Alert and oriented x 3 PSYCHIATRIC:  Normal affect   Signed, Douglas Lindau, MD  07/24/2020 1:32 PM    [facility]

## 2020-08-01 ENCOUNTER — Other Ambulatory Visit: Payer: Self-pay | Admitting: Hematology

## 2020-08-29 ENCOUNTER — Other Ambulatory Visit: Payer: Self-pay | Admitting: Hematology

## 2020-08-29 DIAGNOSIS — C9001 Multiple myeloma in remission: Secondary | ICD-10-CM

## 2020-09-26 ENCOUNTER — Other Ambulatory Visit: Payer: Self-pay | Admitting: Hematology

## 2020-09-26 DIAGNOSIS — C9001 Multiple myeloma in remission: Secondary | ICD-10-CM

## 2020-10-03 ENCOUNTER — Inpatient Hospital Stay: Payer: Medicare HMO | Attending: Hematology

## 2020-10-03 ENCOUNTER — Other Ambulatory Visit: Payer: Self-pay

## 2020-10-03 DIAGNOSIS — C9001 Multiple myeloma in remission: Secondary | ICD-10-CM | POA: Diagnosis not present

## 2020-10-03 DIAGNOSIS — Z79899 Other long term (current) drug therapy: Secondary | ICD-10-CM | POA: Diagnosis not present

## 2020-10-03 LAB — CBC WITH DIFFERENTIAL (CANCER CENTER ONLY)
Abs Immature Granulocytes: 0.01 10*3/uL (ref 0.00–0.07)
Basophils Absolute: 0 10*3/uL (ref 0.0–0.1)
Basophils Relative: 0 %
Eosinophils Absolute: 0.2 10*3/uL (ref 0.0–0.5)
Eosinophils Relative: 5 %
HCT: 36.3 % — ABNORMAL LOW (ref 39.0–52.0)
Hemoglobin: 11.6 g/dL — ABNORMAL LOW (ref 13.0–17.0)
Immature Granulocytes: 0 %
Lymphocytes Relative: 32 %
Lymphs Abs: 1.2 10*3/uL (ref 0.7–4.0)
MCH: 28.2 pg (ref 26.0–34.0)
MCHC: 32 g/dL (ref 30.0–36.0)
MCV: 88.1 fL (ref 80.0–100.0)
Monocytes Absolute: 0.5 10*3/uL (ref 0.1–1.0)
Monocytes Relative: 13 %
Neutro Abs: 1.9 10*3/uL (ref 1.7–7.7)
Neutrophils Relative %: 50 %
Platelet Count: 177 10*3/uL (ref 150–400)
RBC: 4.12 MIL/uL — ABNORMAL LOW (ref 4.22–5.81)
RDW: 18.3 % — ABNORMAL HIGH (ref 11.5–15.5)
WBC Count: 3.8 10*3/uL — ABNORMAL LOW (ref 4.0–10.5)
nRBC: 0 % (ref 0.0–0.2)

## 2020-10-03 LAB — CMP (CANCER CENTER ONLY)
ALT: 18 U/L (ref 0–44)
AST: 12 U/L — ABNORMAL LOW (ref 15–41)
Albumin: 3.5 g/dL (ref 3.5–5.0)
Alkaline Phosphatase: 69 U/L (ref 38–126)
Anion gap: 5 (ref 5–15)
BUN: 23 mg/dL (ref 8–23)
CO2: 25 mmol/L (ref 22–32)
Calcium: 8.8 mg/dL — ABNORMAL LOW (ref 8.9–10.3)
Chloride: 109 mmol/L (ref 98–111)
Creatinine: 1.72 mg/dL — ABNORMAL HIGH (ref 0.61–1.24)
GFR, Estimated: 41 mL/min — ABNORMAL LOW (ref >60)
Glucose, Bld: 198 mg/dL — ABNORMAL HIGH (ref 70–99)
Potassium: 3.8 mmol/L (ref 3.5–5.1)
Sodium: 139 mmol/L (ref 135–145)
Total Bilirubin: 0.5 mg/dL (ref 0.3–1.2)
Total Protein: 7.3 g/dL (ref 6.5–8.1)

## 2020-10-04 LAB — KAPPA/LAMBDA LIGHT CHAINS
Kappa free light chain: 71.7 mg/L — ABNORMAL HIGH (ref 3.3–19.4)
Kappa, lambda light chain ratio: 1.8 — ABNORMAL HIGH (ref 0.26–1.65)
Lambda free light chains: 39.8 mg/L — ABNORMAL HIGH (ref 5.7–26.3)

## 2020-10-08 LAB — MULTIPLE MYELOMA PANEL, SERUM
Albumin SerPl Elph-Mcnc: 3.3 g/dL (ref 2.9–4.4)
Albumin/Glob SerPl: 1 (ref 0.7–1.7)
Alpha 1: 0.2 g/dL (ref 0.0–0.4)
Alpha2 Glob SerPl Elph-Mcnc: 0.8 g/dL (ref 0.4–1.0)
B-Globulin SerPl Elph-Mcnc: 0.9 g/dL (ref 0.7–1.3)
Gamma Glob SerPl Elph-Mcnc: 1.6 g/dL (ref 0.4–1.8)
Globulin, Total: 3.6 g/dL (ref 2.2–3.9)
IgA: 407 mg/dL (ref 61–437)
IgG (Immunoglobin G), Serum: 1585 mg/dL (ref 603–1613)
IgM (Immunoglobulin M), Srm: 30 mg/dL (ref 15–143)
Total Protein ELP: 6.9 g/dL (ref 6.0–8.5)

## 2020-10-10 ENCOUNTER — Other Ambulatory Visit: Payer: Self-pay

## 2020-10-10 ENCOUNTER — Inpatient Hospital Stay: Payer: Medicare HMO | Attending: Hematology | Admitting: Hematology

## 2020-10-10 VITALS — BP 127/71 | HR 64 | Temp 97.9°F | Resp 19 | Ht 71.0 in | Wt 174.7 lb

## 2020-10-10 DIAGNOSIS — E119 Type 2 diabetes mellitus without complications: Secondary | ICD-10-CM | POA: Diagnosis not present

## 2020-10-10 DIAGNOSIS — Z7982 Long term (current) use of aspirin: Secondary | ICD-10-CM | POA: Insufficient documentation

## 2020-10-10 DIAGNOSIS — N183 Chronic kidney disease, stage 3 unspecified: Secondary | ICD-10-CM | POA: Diagnosis not present

## 2020-10-10 DIAGNOSIS — Z794 Long term (current) use of insulin: Secondary | ICD-10-CM | POA: Diagnosis not present

## 2020-10-10 DIAGNOSIS — C9001 Multiple myeloma in remission: Secondary | ICD-10-CM | POA: Insufficient documentation

## 2020-10-10 DIAGNOSIS — D631 Anemia in chronic kidney disease: Secondary | ICD-10-CM | POA: Insufficient documentation

## 2020-10-10 DIAGNOSIS — Z79899 Other long term (current) drug therapy: Secondary | ICD-10-CM | POA: Insufficient documentation

## 2020-10-16 NOTE — Progress Notes (Signed)
Douglas Rose Kitchen  HEMATOLOGY ONCOLOGY PROGRESS NOTE  Date of service:   Douglas Rose Rose Care Team: [name], MD as PCP - General (Family Medicine)  Chief Complaint : f/u for multiple myeloma  Diagnosis:  IgG Kappa multiple myeloma    Current Treatment:  Revlimid maintenance   INTERVAL HISTORY:  Douglas Rose Rose presents today for management and evaluation of his maintenance Revlimid for his Multiple Myeloma. Douglas Rose Douglas Rose Rose's last visit with [name] was on 07/03/2020. Douglas Rose Rose reports that Douglas Rose Rose is doing well overall.  Douglas Rose Rose reports no new symptoms or concerns.  Douglas Rose Rose notes no new bone pains no new chest pain or shortness of breath.  No new fatigue. No primary toxicities from has maintenance Revlimid.  Grade 1 muscle cramps.  Grade 1 diarrhea.  Lab results from 10/03/2020 reviewed with Douglas Rose Douglas Rose Rose.   REVIEW OF SYSTEMS:   10 Point review of Systems was done is negative except as noted above.   Past Medical History:  Diagnosis Date   Abdominal fullness 02/26/2016   Last Assessment & Plan:  overall improving with no pain, no further nausea and normal BM today.  No signs of obstruction or infection today.  Due for 10 year colonoscopy, mild anemia in December, will refer to GI   Acute renal failure ([place]) 03/06/2016   Acute renal failure superimposed on chronic kidney disease ([place]) 03/01/2016   AKI (acute kidney injury) ([place])    Arterial insufficiency of lower extremity ([place]) 09/01/2016   Overview:  Mild (2018)  Last Assessment & Plan:  Mild arterial insufficiency, Douglas Rose Rose does not have classic symptoms of claudication. Advised daily foot checks and would consider further evaluation if new or worsening symptoms  Mild (2018)  Last Assessment & Plan:  asymptomatic.  We will follow-up as needed Formatting of this note might be different from Douglas Rose original. Mild (2018)  Last Assessment & Pla   Atherosclerotic heart disease of native coronary artery without angina pectoris 05/02/2020   Benign hypertension 07/21/2011   Last  Assessment & Plan:  Formatting of this note might be different from Douglas Rose original.  We discussed blood pressure above goal today. Previously well controlled. I recommended Douglas Rose Rose continue current medications, focus on lifestyle modifications including low-sodium diet and will reassess in 3 months   Benign prostatic hyperplasia 05/02/2020   CAD (coronary artery disease) 2006   stent LAD 3.0x18 [place], occluded RCA   Cancer ([place])    multiple myeloma   Cervical lymphadenopathy 03/31/2016   Colitis 05/12/2016   Coronary artery disease 07/21/2011   stent LAD 3.0x18 Cypher, occluded RCA  Overview:  Stent 2006 at [facility], Low risk stress test feb 2017  Last Assessment & Plan:  Declined further stress testing in 2015 due to cost and absence of symptoms.  Continues to be asymptomatic.  Douglas Rose Rose has upcoming follow-up with cardiology for review. 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  Douglas Rose Rose  is up-to-date on Covid booster   Infected tooth 03/31/2016   Leukocytosis    Lipoma 07/19/2014   Overview:  Right scalp.  Seen by Payton Mccallum in past  Last Assessment & Plan:  Lipoma on right scalp getting larger, will refer to ENT, but more concerned about lymph node   Lymphadenopathy 11/21/2015   Last Assessment & Plan:  Right submandibular lymph node, non-tender, now larger. CBC 2 months ago showed mild anemia, normal peripheral smear.  Will recheck today  along with ESR, sed rate.  Will refer to ENT for further evaluation.    Last Assessment & Plan:   Soft, right-sided postauricular mass, deferred intervention in 2018 at onset of myeloma diagnosis, gradually enlarging over time.  Had cons   Monoclonal gammopathy    Multiple myeloma ([place]) 03/20/2016   Last Assessment & Plan:  Given dx, rec social distancing, avoiding in person jury duty.  Letter provided Formatting of this note might be different from Douglas Rose original. Last Assessment & Plan:  Myeloma treatment is going well, Douglas Rose Rose has upcoming oncology visit   Multiple myeloma in remission ([place]) 07/22/2016   Last Assessment & Plan:  Formatting of this note might be different from Douglas Rose original.  Douglas Rose Rose remains in remission, followed by oncology.  Had labs drawn today   Palliative care by specialist    Pancreatitis 03/02/2016   Peripheral neuropathy 07/22/2016   Last Assessment & Plan:  Overall improved. Douglas Rose Rose has discontinued gabapentin, did not feel much difference  Feet and fingers.  Multifactorial, DM, Chemo, MM  Last Assessment & Plan:   Doing well on gabapentin Formatting of this note might be different from Douglas Rose original. Last Assessment & Plan:  Overall improved. Douglas Rose Rose has discontinued gabapentin, did not feel much difference   Physical deconditioning 06/13/2016   Last Assessment & Plan:  Marked weakness.  Will request further home assistance per home health with bathing.  Discharged on a supplement- Douglas Rose Rose reports is unaffordable.  Advised prefer food as no barriers, appetite reasonable, ok to use glucerna or carnation instant breakfast if desired.   Pre-operative clearance 01/30/2017   Protein-calorie malnutrition, severe 03/03/2016   Right hip pain 09/27/2019   Last Assessment & Plan:  Formatting of this note might be different from Douglas Rose original.  Recent tests reassuring for no malignancy.  known hip arthritis.  Symptoms today separate from neuropathy.  Douglas Rose Rose plans on starting program with trainer at [facility],  declined Rose today for gait and strength.  Will reassess at followup.   Screening for malignant neoplasm of colon 05/02/2020   Sinus tachycardia 05/13/2016   SIRS (systemic inflammatory response syndrome) ([place]) 05/12/2016   Stage 3 chronic kidney disease ([place]) 06/13/2016   Last Assessment & Plan:  Will check Cr/K today  June 2020, annual follow-up [place] kidney  Last Assessment & Plan:   Recent labs reviewed- stable Formatting of this note might be different from Douglas Rose original. Last Assessment & Plan:  Will check Cr/K today   Type 2 diabetes mellitus ([place]) 07/21/2011   Last Assessment & Plan:   Diabetes improved with fasting glucoses in a safe range.  Continue current regimen. Formatting of this note might be different from Douglas Rose original. Last Assessment & Plan:  Douglas Rose Rose's diabetes has been well controlled at baseline on 12 units of Lantus daily.  Douglas Rose Rose reports some hyperglycemia on Douglas Rose first few days after dexamethasone with resolution by Douglas Rose end of Douglas Rose week. We'll   Upper abdominal pain    Urinary retention  Vitamin D deficiency 05/02/2020   Weakness generalized     . Past Surgical History:  Procedure Laterality Date   CARDIAC CATHETERIZATION  2006   CORONARY ANGIOPLASTY WITH STENT PLACEMENT  2007   IR FLUORO GUIDE CV LINE RIGHT  05/20/2016   IR US GUIDE VASC ACCESS RIGHT  05/20/2016    . Social History   Tobacco Use   Smoking status: Never   Smokeless tobacco: Never  Vaping Use   Vaping Use: Never used  Substance Use Topics   Alcohol use: No   Drug use: No    ALLERGIES:  has No Known Allergies.  MEDICATIONS:  Current Outpatient Medications  Medication Sig Dispense Refill   amLODipine (NORVASC) 10 MG tablet Take 10 mg by mouth daily.     aspirin 81 MG tablet Take 81 mg by mouth 2 (two) times daily.     atorvastatin (LIPITOR) 20 MG tablet Take 1 tablet (20 mg total) by mouth daily. 90 tablet 3   Cholecalciferol 10 MCG (400 UNIT) CAPS Take 1 capsule by mouth daily.     gabapentin  (NEURONTIN) 300 MG capsule Take 300 mg by mouth daily.     insulin glargine (LANTUS) 100 UNIT/ML injection Inject 16 Units into Douglas Rose skin daily.     lisinopril (ZESTRIL) 2.5 MG tablet Take 2.5 mg by mouth daily.     metoprolol succinate (TOPROL-XL) 100 MG 24 hr tablet Take 100 mg by mouth daily.      nitroGLYCERIN (NITROSTAT) 0.4 MG SL tablet Place 1 tablet (0.4 mg total) under Douglas Rose tongue every 5 (five) minutes x 3 doses as needed for chest pain. 30 tablet 12   REVLIMID 10 MG capsule TAKE 1 CAPSULE BY MOUTH  DAILY FOR 21 DAYS, THEN 7  DAYS OFF 21 capsule 0   vitamin B-12 (CYANOCOBALAMIN) 1000 MCG tablet Take 1,000 mcg by mouth daily.     No current facility-administered medications for this visit.    PHYSICAL EXAMINATION: ECOG PERFORMANCE STATUS: 1 - Symptomatic but completely ambulatory  Vitals:   10/10/20 1419  BP: 127/71  Pulse: 64  Resp: 19  Temp: 97.9 F (36.6 C)  SpO2: 99%     Filed Weights   10/10/20 1419  Weight: 174 lb 11.2 oz (79.2 kg)    .Body mass index is 24.37 kg/m.  Douglas Rose Kitchen GENERAL:alert, in no acute distress and comfortable SKIN: no acute rashes, no significant lesions EYES: conjunctiva are pink and non-injected, sclera anicteric OROPHARYNX: MMM, no exudates, no oropharyngeal erythema or ulceration NECK: supple, no JVD LYMPH:  no palpable lymphadenopathy in Douglas Rose cervical, axillary or inguinal regions LUNGS: clear to auscultation b/l with normal respiratory effort HEART: regular rate & rhythm ABDOMEN:  normoactive bowel sounds , non tender, not distended. Extremity: no pedal edema PSYCH: alert & oriented x 3 with fluent speech NEURO: no focal motor/sensory deficits  LABORATORY DATA:   I have reviewed Douglas Rose data as listed  06/08/2018 MMP Component     Latest Ref Rng & Units 06/08/2018  IgG (Immunoglobin G), Serum     603 - 1,613 mg/dL 1,556  IgA     61 - 437 mg/dL 303  IgM (Immunoglobulin M), Srm     15 - 143 mg/dL 36  Total Protein ELP     6.0 - 8.5 g/dL  6.5  Albumin SerPl Elph-Mcnc     2.9 - 4.4 g/dL 3.2  Alpha 1     0.0 - 0.4 g/dL 0.2  Alpha2 Glob SerPl Elph-Mcnc  0.4 - 1.0 g/dL 0.8  B-Globulin SerPl Elph-Mcnc     0.7 - 1.3 g/dL 0.9  Gamma Glob SerPl Elph-Mcnc     0.4 - 1.8 g/dL 1.5  M Protein SerPl Elph-Mcnc     Not Observed g/dL Not Observed  Globulin, Total     2.2 - 3.9 g/dL 3.3  Albumin/Glob SerPl     0.7 - 1.7 1.0  IFE 1      Comment  Please Note (HCV):      Comment   . CBC Latest Ref Rng & Units 10/03/2020 07/03/2020 04/02/2020  WBC 4.0 - 10.5 K/uL 3.8(L) 3.6(L) 2.6(L)  Hemoglobin 13.0 - 17.0 g/dL 11.6(L) 10.5(L) 11.4(L)  Hematocrit 39.0 - 52.0 % 36.3(L) 32.8(L) 35.9(L)  Platelets 150 - 400 K/uL 177 160 186  ANC 1k  . CMP Latest Ref Rng & Units 10/03/2020 07/03/2020 04/02/2020  Glucose 70 - 99 mg/dL 198(H) 166(H) 107(H)  BUN 8 - 23 mg/dL 23 27(H) 29(H)  Creatinine 0.61 - 1.24 mg/dL 1.72(H) 1.94(H) 1.58(H)  Sodium 135 - 145 mmol/L 139 138 138  Potassium 3.5 - 5.1 mmol/L 3.8 3.7 3.9  Chloride 98 - 111 mmol/L 109 105 105  CO2 22 - 32 mmol/L 25 25 24   Calcium 8.9 - 10.3 mg/dL 8.8(L) 8.7(L) 8.6(L)  Total Protein 6.5 - 8.1 g/dL 7.3 7.1 7.8  Total Bilirubin 0.3 - 1.2 mg/dL 0.5 0.4 0.3  Alkaline Phos 38 - 126 U/L 69 91 86  AST 15 - 41 U/L 12(L) 14(L) 17  ALT 0 - 44 U/L 18 17 21        RADIOGRAPHIC STUDIES: I have personally reviewed Douglas Rose radiological images as listed and agreed with Douglas Rose findings in Douglas Rose report. No results found.  ASSESSMENT & PLAN:   75 y.o.  with multiple medical comorbidities but fairly good performance status overall with   1) ISS Stage II IgG [place] multiple myeloma. Anemia + Renal insufficiency.  Bone survey neg for concerning bone lesions. significantly elevated kappa free light chains 858, lambda 18.4, with a ratio of 46.65 . Douglas Rose Rose was also noted to have an M spike of 1.7 g/dL with IFE showing IgG kappa monoclonal protein. Quantitative IgG level was increased to nearly 2500 mg/dL. 24-hour  UPEP showed monoclonal protein of 1300 mg per 24 hours which constituted about 86% of all Douglas Rose urinary protein. Bone marrow biopsy showed 20-30% Restricted plasma cells consistent with multiple myeloma Cytogenetics/FISH  Showed +4, +11, and +17   Douglas Rose Rose has been treated with CyBord X 2 cycles . Treatment interrupted due to severe c diff colitis with prolonged hospitalization and decline in performance status.  Douglas Rose Rose is much improved and back to baseline now. M protein undetected with neg IFE Has been treated with 5 cycles of Rd  Currently on maintenance Rituxan and in Remission.  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 Had an element of obstructive uropathy due to BPH which has resolved. Creatinine continues to be stable.   3) Normocytic anemia likely related to multiple myeloma.-stable, improved. hgb 11.5 No abdominal pain or discomfort over Douglas Rose right upper quadrant. No fevers or chills.  PLAN:  -Discussed Rose labwork, 10/03/2020; countas and chemistries stable. MMP and light chains still stable. -Recommended Rose start OTC Magnesium to help with Douglas Rose muscle cramping. -Continue Vitamin D.  --Douglas Rose Rose has no prohibitive toxicities from continuing maintenance 10 mg Revlimid, three weeks on and one week off, at this time. -Douglas Rose Rose has no clinical symptoms suggestive  of myeloma progression at this time -Continue 81 mg Aspirin daily  -Will see back in 3 months with labs 1 week prior.   4) DM2 - controlled -continue f/u with PCP.   FOLLOW UP: RTC with Dr Irene Limbo in 3 months. Plz schedule labs 1 week prior to clinic visit    . Douglas Rose total time spent in Douglas Rose appointment was 20 minutes and more than 50% was on counseling and direct Douglas Rose Rose cares.   All of Douglas Rose Douglas Rose Rose's questions were answered with apparent satisfaction. Douglas Rose Douglas Rose Rose knows to call Douglas Rose clinic with any problems, questions or concerns.   Sullivan Lone MD [place] AAHIVMS [facility]  [facility] Hematology/Oncology Physician [facility]

## 2020-10-23 ENCOUNTER — Other Ambulatory Visit: Payer: Self-pay

## 2020-10-23 DIAGNOSIS — C9001 Multiple myeloma in remission: Secondary | ICD-10-CM

## 2020-10-23 MED ORDER — LENALIDOMIDE 10 MG PO CAPS
ORAL_CAPSULE | ORAL | 0 refills | Status: DC
Start: 2020-10-23 — End: 2020-11-19

## 2020-11-19 ENCOUNTER — Other Ambulatory Visit: Payer: Self-pay

## 2020-11-19 DIAGNOSIS — C9001 Multiple myeloma in remission: Secondary | ICD-10-CM

## 2020-11-19 MED ORDER — LENALIDOMIDE 10 MG PO CAPS: ORAL_CAPSULE | ORAL | 0 refills | Status: DC

## 2020-12-18 ENCOUNTER — Other Ambulatory Visit: Payer: Self-pay

## 2020-12-18 DIAGNOSIS — C9001 Multiple myeloma in remission: Secondary | ICD-10-CM

## 2020-12-18 MED ORDER — LENALIDOMIDE 10 MG PO CAPS: ORAL_CAPSULE | ORAL | 0 refills | Status: DC

## 2020-12-25 ENCOUNTER — Inpatient Hospital Stay: Payer: Medicare HMO

## 2021-01-04 ENCOUNTER — Other Ambulatory Visit: Payer: Self-pay

## 2021-01-04 DIAGNOSIS — C9001 Multiple myeloma in remission: Secondary | ICD-10-CM

## 2021-01-08 ENCOUNTER — Inpatient Hospital Stay: Payer: Medicare HMO | Admitting: Hematology

## 2021-01-08 ENCOUNTER — Inpatient Hospital Stay: Payer: Medicare HMO | Attending: Hematology

## 2021-01-08 ENCOUNTER — Other Ambulatory Visit: Payer: Self-pay

## 2021-01-08 DIAGNOSIS — C9001 Multiple myeloma in remission: Secondary | ICD-10-CM | POA: Insufficient documentation

## 2021-01-08 DIAGNOSIS — Z79899 Other long term (current) drug therapy: Secondary | ICD-10-CM | POA: Diagnosis not present

## 2021-01-08 LAB — CBC WITH DIFFERENTIAL (CANCER CENTER ONLY)
Abs Immature Granulocytes: 0.03 10*3/uL (ref 0.00–0.07)
Basophils Absolute: 0 10*3/uL (ref 0.0–0.1)
Basophils Relative: 0 %
Eosinophils Absolute: 0.1 10*3/uL (ref 0.0–0.5)
Eosinophils Relative: 2 %
HCT: 33.8 % — ABNORMAL LOW (ref 39.0–52.0)
Hemoglobin: 10.8 g/dL — ABNORMAL LOW (ref 13.0–17.0)
Immature Granulocytes: 1 %
Lymphocytes Relative: 14 %
Lymphs Abs: 0.7 10*3/uL (ref 0.7–4.0)
MCH: 27.3 pg (ref 26.0–34.0)
MCHC: 32 g/dL (ref 30.0–36.0)
MCV: 85.6 fL (ref 80.0–100.0)
Monocytes Absolute: 0.2 10*3/uL (ref 0.1–1.0)
Monocytes Relative: 5 %
Neutro Abs: 3.8 10*3/uL (ref 1.7–7.7)
Neutrophils Relative %: 78 %
Platelet Count: 269 10*3/uL (ref 150–400)
RBC: 3.95 MIL/uL — ABNORMAL LOW (ref 4.22–5.81)
RDW: 17.5 % — ABNORMAL HIGH (ref 11.5–15.5)
WBC Count: 4.8 10*3/uL (ref 4.0–10.5)
nRBC: 0 % (ref 0.0–0.2)

## 2021-01-08 LAB — CMP (CANCER CENTER ONLY)
ALT: 20 U/L (ref 0–44)
AST: 15 U/L (ref 15–41)
Albumin: 3.3 g/dL — ABNORMAL LOW (ref 3.5–5.0)
Alkaline Phosphatase: 78 U/L (ref 38–126)
Anion gap: 7 (ref 5–15)
BUN: 23 mg/dL (ref 8–23)
CO2: 24 mmol/L (ref 22–32)
Calcium: 8.6 mg/dL — ABNORMAL LOW (ref 8.9–10.3)
Chloride: 103 mmol/L (ref 98–111)
Creatinine: 1.61 mg/dL — ABNORMAL HIGH (ref 0.61–1.24)
GFR, Estimated: 44 mL/min — ABNORMAL LOW (ref >60)
Glucose, Bld: 245 mg/dL — ABNORMAL HIGH (ref 70–99)
Potassium: 4.2 mmol/L (ref 3.5–5.1)
Sodium: 134 mmol/L — ABNORMAL LOW (ref 135–145)
Total Bilirubin: 0.3 mg/dL (ref 0.3–1.2)
Total Protein: 7.5 g/dL (ref 6.5–8.1)

## 2021-01-09 ENCOUNTER — Other Ambulatory Visit: Payer: Self-pay

## 2021-01-09 LAB — KAPPA/LAMBDA LIGHT CHAINS
Kappa free light chain: 79.6 mg/L — ABNORMAL HIGH (ref 3.3–19.4)
Kappa, lambda light chain ratio: 1.68 — ABNORMAL HIGH (ref 0.26–1.65)
Lambda free light chains: 47.4 mg/L — ABNORMAL HIGH (ref 5.7–26.3)

## 2021-01-10 ENCOUNTER — Other Ambulatory Visit: Payer: Self-pay | Admitting: Hematology

## 2021-01-10 DIAGNOSIS — C9001 Multiple myeloma in remission: Secondary | ICD-10-CM

## 2021-01-14 ENCOUNTER — Other Ambulatory Visit: Payer: Self-pay

## 2021-01-14 ENCOUNTER — Inpatient Hospital Stay: Payer: Medicare HMO | Admitting: Hematology

## 2021-01-14 ENCOUNTER — Other Ambulatory Visit (HOSPITAL_COMMUNITY): Payer: Self-pay

## 2021-01-14 VITALS — BP 136/66 | HR 82 | Temp 97.5°F | Resp 20 | Wt 171.4 lb

## 2021-01-14 DIAGNOSIS — C9001 Multiple myeloma in remission: Secondary | ICD-10-CM | POA: Diagnosis not present

## 2021-01-14 LAB — MULTIPLE MYELOMA PANEL, SERUM
Albumin SerPl Elph-Mcnc: 2.9 g/dL (ref 2.9–4.4)
Albumin/Glob SerPl: 0.9 (ref 0.7–1.7)
Alpha 1: 0.3 g/dL (ref 0.0–0.4)
Alpha2 Glob SerPl Elph-Mcnc: 0.9 g/dL (ref 0.4–1.0)
B-Globulin SerPl Elph-Mcnc: 0.8 g/dL (ref 0.7–1.3)
Gamma Glob SerPl Elph-Mcnc: 1.5 g/dL (ref 0.4–1.8)
Globulin, Total: 3.5 g/dL (ref 2.2–3.9)
IgA: 384 mg/dL (ref 61–437)
IgG (Immunoglobin G), Serum: 1496 mg/dL (ref 603–1613)
IgM (Immunoglobulin M), Srm: 30 mg/dL (ref 15–143)
Total Protein ELP: 6.4 g/dL (ref 6.0–8.5)

## 2021-01-15 ENCOUNTER — Ambulatory Visit: Payer: Medicare HMO | Admitting: Cardiology

## 2021-01-15 DIAGNOSIS — R61 Generalized hyperhidrosis: Secondary | ICD-10-CM

## 2021-01-15 HISTORY — DX: Generalized hyperhidrosis: R61

## 2021-01-17 ENCOUNTER — Other Ambulatory Visit: Payer: Self-pay

## 2021-01-17 DIAGNOSIS — C9001 Multiple myeloma in remission: Secondary | ICD-10-CM

## 2021-01-17 MED ORDER — LENALIDOMIDE 10 MG PO CAPS: ORAL_CAPSULE | ORAL | 0 refills | Status: DC

## 2021-01-18 ENCOUNTER — Ambulatory Visit: Payer: Medicare HMO | Admitting: Cardiology

## 2021-01-18 ENCOUNTER — Encounter: Payer: Self-pay | Admitting: Cardiology

## 2021-01-18 ENCOUNTER — Other Ambulatory Visit: Payer: Self-pay

## 2021-01-18 VITALS — BP 134/72 | HR 74 | Ht 71.0 in | Wt 169.0 lb

## 2021-01-18 DIAGNOSIS — I10 Essential (primary) hypertension: Secondary | ICD-10-CM

## 2021-01-18 DIAGNOSIS — I2510 Atherosclerotic heart disease of native coronary artery without angina pectoris: Secondary | ICD-10-CM | POA: Diagnosis not present

## 2021-01-18 DIAGNOSIS — E782 Mixed hyperlipidemia: Secondary | ICD-10-CM

## 2021-01-18 DIAGNOSIS — E119 Type 2 diabetes mellitus without complications: Secondary | ICD-10-CM

## 2021-01-18 NOTE — Progress Notes (Signed)
Cardiology Office Note:    Date:  01/18/2021   ID:  [name]., DOB Sep 20, 1945, MRN [id number]  PCP:  [name], MD  Cardiologist:  Jenean Lindau, MD   Referring MD: [name], MD    ASSESSMENT:    1. Coronary artery disease involving native coronary artery of native heart without angina pectoris   2. Benign hypertension   3. Mixed hyperlipidemia   4. Diabetes mellitus without complication (HCC)    PLAN:    In order of problems listed above:  Coronary artery disease: Secondary prevention stressed to the patient.  Importance of compliance with diet medication stressed and vocalized understanding.  The risks of sedentary lifestyle was detailed and he promises to do better.  Patient was advised to walk at least half an hour a day 5 days a week and he promises to do better. Essential hypertension: Blood pressure stable and diet was emphasized.  Medications were reviewed. Mixed dyslipidemia: Lipids are not available to me.  He tells me that his primary care has done blood work recently and follow-up also has been scheduled in the next few weeks.  I told him to send me a copy of the blood work and he promises to do that.  I will review this and advise him accordingly. Diabetes mellitus: I stressed the importance of diet and exercise especially in view of the fact that he has multiple risk factors.  He understood and questions were answered to his satisfaction Patient will be seen in follow-up appointment in 6 months or earlier if the patient has any concerns    Medication Adjustments/Labs and Tests Ordered: Current medicines are reviewed at length with the patient today.  Concerns regarding medicines are outlined above.  No orders of the defined types were placed in this encounter.  No orders of the defined types were placed in this encounter.    No chief complaint on file.    History of Present Illness:    Douglas Rose. is a 76 y.o. male.  Patient has  past medical history of coronary artery disease, essential hypertension, mixed dyslipidemia and diabetes mellitus.  He denies any problems at this time and takes care of activities of daily living.  No chest pain orthopnea or PND.  Overall he leads a sedentary lifestyle.  At the time of my evaluation, the patient is alert awake oriented and in no distress.  Past Medical History:  Diagnosis Date   Abdominal fullness 02/26/2016   Last Assessment & Plan:  overall improving with no pain, no further nausea and normal BM today.  No signs of obstruction or infection today.  Due for 10 year colonoscopy, mild anemia in December, will refer to GI   Acute renal failure ([place]) 03/06/2016   Acute renal failure superimposed on chronic kidney disease ([place]) 03/01/2016   AKI (acute kidney injury) ([place])    Arterial insufficiency of lower extremity ([place]) 09/01/2016   Overview:  Mild (2018)  Last Assessment & Plan:  Mild arterial insufficiency, he does not have classic symptoms of claudication. Advised daily foot checks and would consider further evaluation if new or worsening symptoms  Mild (2018)  Last Assessment & Plan:  asymptomatic.  We will follow-up as needed Formatting of this note might be different from the original. Mild (2018)  Last Assessment & Pla   Atherosclerotic heart disease of native coronary artery without angina pectoris 05/02/2020   Benign hypertension 07/21/2011   Last Assessment & Plan:  Formatting  of this note might be different from the original.  We discussed blood pressure above goal today. Previously well controlled. I recommended he continue current medications, focus on lifestyle modifications including low-sodium diet and will reassess in 3 months   Benign prostatic hyperplasia 05/02/2020   CAD (coronary artery disease) 2006   stent LAD 3.0x18 [place], occluded RCA   Cancer ([place])    multiple myeloma   Cervical lymphadenopathy 03/31/2016   Colitis 05/12/2016   Coronary artery disease 07/21/2011    stent LAD 3.0x18 [place], occluded RCA  Overview:  Stent 2006 at [facility], Low risk stress test feb 2017  Last Assessment & Plan:  Declined further stress testing in 2015 due to cost and absence of symptoms.  Continues to be asymptomatic.  He has upcoming follow-up with cardiology for review. Encouraged to keep appt  Stent 2006 at 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  Patient is up-to-date on Covid booster   Infected tooth 03/31/2016   Leukocytosis    Lipoma 07/19/2014   Overview:  Right scalp.  Seen by Payton Mccallum in past  Last Assessment & Plan:  Lipoma on right scalp getting larger, will refer to ENT, but more concerned about lymph node   Lymphadenopathy 11/21/2015   Last Assessment & Plan:  Right submandibular lymph node, non-tender, now larger. CBC 2 months ago showed mild anemia, normal peripheral smear.  Will recheck today along with ESR, sed rate.  Will refer to ENT for further evaluation.    Last Assessment & Plan:   Soft, right-sided postauricular mass, deferred intervention in 2018 at onset of myeloma diagnosis, gradually enlarging over time.  Had cons   Monoclonal gammopathy    Multiple myeloma ([place]) 03/20/2016   Last Assessment & Plan:  Given dx, rec social distancing, avoiding in person jury duty.  Letter provided Formatting of this note might be different from the original. Last Assessment & Plan:  Myeloma treatment is going well, he has upcoming  oncology visit   Multiple myeloma in remission ([place]) 07/22/2016   Last Assessment & Plan:  Formatting of this note might be different from the original.  Patient remains in remission, followed by oncology.  Had labs drawn today   Palliative care by specialist    Pancreatitis 03/02/2016   Peripheral neuropathy 07/22/2016   Last Assessment & Plan:  Overall improved. Patient has discontinued gabapentin, did not feel much difference  Feet and fingers.  Multifactorial, DM, Chemo, MM  Last Assessment & Plan:   Doing well on gabapentin Formatting of this note might be different from the original. Last Assessment & Plan:  Overall improved. Patient has discontinued gabapentin, did not feel much difference   Physical deconditioning 06/13/2016   Last Assessment & Plan:  Marked weakness.  Will request further home assistance per home health with bathing.  Discharged on a supplement- patient reports is unaffordable.  Advised prefer food as no barriers, appetite reasonable, ok to  use glucerna or carnation instant breakfast if desired.   Pre-operative clearance 01/30/2017   Protein-calorie malnutrition, severe 03/03/2016   Right hip pain 09/27/2019   Last Assessment & Plan:  Formatting of this note might be different from the original.  Recent tests reassuring for no malignancy.  known hip arthritis.  Symptoms today separate from neuropathy.  He plans on starting program with trainer at [facility], declined PT today for gait and strength.  Will reassess at followup.   Screening for malignant neoplasm of colon 05/02/2020   Sinus tachycardia 05/13/2016   SIRS (systemic inflammatory response syndrome) ([place]) 05/12/2016   Stage 3 chronic kidney disease ([place]) 06/13/2016   Last Assessment & Plan:  Will check Cr/K today  June 2020, annual follow-up [place] [facility]  Last Assessment & Plan:   Recent labs reviewed- stable Formatting of this note might be different from the original. Last Assessment & Plan:  Will check Cr/K today   Type 2  diabetes mellitus ([place]) 07/21/2011   Last Assessment & Plan:   Diabetes improved with fasting glucoses in a safe range.  Continue current regimen. Formatting of this note might be different from the original. Last Assessment & Plan:  Patient's diabetes has been well controlled at baseline on 12 units of Lantus daily.  He reports some hyperglycemia on the first few days after dexamethasone with resolution by the end of the week. We'll   Upper abdominal pain    Urinary retention    Vitamin D deficiency 05/02/2020   Weakness generalized     Past Surgical History:  Procedure Laterality Date   CARDIAC CATHETERIZATION  2006   CORONARY ANGIOPLASTY WITH STENT PLACEMENT  2007   IR FLUORO GUIDE CV LINE RIGHT  05/20/2016   IR US GUIDE VASC ACCESS RIGHT  05/20/2016    Current Medications: Current Meds  Medication Sig   amLODipine (NORVASC) 10 MG tablet Take 10 mg by mouth daily.   aspirin 81 MG tablet Take 81 mg by mouth 2 (two) times daily.   atorvastatin (LIPITOR) 20 MG tablet Take 1 tablet (20 mg total) by mouth daily.   Cholecalciferol 10 MCG (400 UNIT) CAPS Take 1 capsule by mouth daily.   gabapentin (NEURONTIN) 300 MG capsule Take 300 mg by mouth daily.   LANTUS SOLOSTAR 100 UNIT/ML Solostar Pen Inject 16 Units into the skin daily.   lenalidomide (REVLIMID) 10 MG capsule TAKE 1 CAPSULE BY MOUTH DAILY  FOR 21 DAYS, THEN 7 DAYS OFF   lisinopril (ZESTRIL) 2.5 MG tablet Take 2.5 mg by mouth daily.   metoprolol succinate (TOPROL-XL) 100 MG 24 hr tablet Take 100 mg by mouth daily.    nitroGLYCERIN (NITROSTAT) 0.4 MG SL tablet Place 1 tablet (0.4 mg total) under the tongue every 5 (five) minutes x 3 doses as needed for chest pain.   vitamin B-12 (CYANOCOBALAMIN) 1000 MCG tablet Take 1,000 mcg by mouth daily.     Allergies:   Patient has no known allergies.   Social History   Socioeconomic History   Marital status: Married    Spouse name: Not on file   Number of children: 6   Years of  education: Not on file   Highest education level: Not on file  Occupational History   Occupation: retired Company secretary  Tobacco Use   Smoking status: Never   Smokeless tobacco: Never  Vaping Use   Vaping Use: Never used  Substance and Sexual Activity   Alcohol use: No   Drug use: No  Sexual activity: Not Currently  Other Topics Concern   Not on file  Social History Narrative   Not on file   Social Determinants of Health   Financial Resource Strain: Not on file  Food Insecurity: Not on file  Transportation Needs: Not on file  Physical Activity: Not on file  Stress: Not on file  Social Connections: Not on file     Family History: The patient's family history includes Arrhythmia in his mother; Cancer in his brother; Diabetes in his brother, brother, and sister; Heart disease in his mother; Stroke in his father.  ROS:   Please see the history of present illness.    All other systems reviewed and are negative.  EKGs/Labs/Other Studies Reviewed:    The following studies were reviewed today: I discussed my findings with the patient extensively and   Recent Labs: 01/08/2021: ALT 20; BUN 23; Creatinine 1.61; Hemoglobin 10.8; Platelet Count 269; Potassium 4.2; Sodium 134  Recent Lipid Panel    Component Value Date/Time   CHOL 134 04/03/2017 1125   TRIG 101 04/03/2017 1125   HDL 50 04/03/2017 1125   CHOLHDL 2.7 04/03/2017 1125   CHOLHDL 4 01/11/2013 1030   VLDL 16.8 01/11/2013 1030   LDLCALC 64 04/03/2017 1125    Physical Exam:    VS:  BP 134/72    Pulse 74    Ht 5' 11"  (1.803 m)    Wt 169 lb 0.6 oz (76.7 kg)    SpO2 99%    BMI 23.58 kg/m     Wt Readings from Last 3 Encounters:  01/18/21 169 lb 0.6 oz (76.7 kg)  07/24/20 173 lb 1.3 oz (78.5 kg)  10/21/19 172 lb (78 kg)     GEN: Patient is in no acute distress HEENT: Normal NECK: No JVD; No carotid bruits LYMPHATICS: No lymphadenopathy CARDIAC: Hear sounds regular, 2/6 systolic murmur at the apex. RESPIRATORY:   Clear to auscultation without rales, wheezing or rhonchi  ABDOMEN: Soft, non-tender, non-distended MUSCULOSKELETAL:  No edema; No deformity  SKIN: Warm and dry NEUROLOGIC:  Alert and oriented x 3 PSYCHIATRIC:  Normal affect   Signed, Jenean Lindau, MD  01/18/2021 3:38 PM    [facility]

## 2021-01-18 NOTE — Patient Instructions (Signed)

## 2021-01-20 NOTE — [place] Notes (Addendum)
Douglas Rose  HEMATOLOGY ONCOLOGY PROGRESS NOTE  Date of service: .01/14/2021   Patient Care Team: [name], MD as PCP - General (Family Medicine)  Chief Complaint : f/u for multiple myeloma  Diagnosis:  IgG Kappa multiple myeloma    Current Treatment:  Revlimid maintenance   INTERVAL HISTORY:  Douglas Rose is here for continued evaluation and management of his multiple myeloma.  He is accompanied by his wife for today's visit. He notes he has been doing well overall with no acute new concerns. Still having some intermittent grade 1 muscle cramps which are manageable. Mild grade 1 intermittent loose stools. No fevers no chills no night sweats. No new bone pains.  No other significant prohibitive toxicities from his Revlimid maintenance. Labs done on 01/08/2021 reviewed with the patient.  REVIEW OF SYSTEMS:   .10 Point review of Systems was done is negative except as noted above.   Past Medical History:  Diagnosis Date   Abdominal fullness 02/26/2016   Last Assessment & Plan:  overall improving with no pain, no further nausea and normal BM today.  No signs of obstruction or infection today.  Due for 10 year colonoscopy, mild anemia in December, will refer to GI   Acute renal failure ([place]) 03/06/2016   Acute renal failure superimposed on chronic kidney disease ([place]) 03/01/2016   AKI (acute kidney injury) ([place])    Arterial insufficiency of lower extremity ([place]) 09/01/2016   Overview:  Mild (2018)  Last Assessment & Plan:  Mild arterial insufficiency, he does not have classic symptoms of claudication. Advised daily foot checks and would consider further evaluation if new or worsening symptoms  Mild (2018)  Last Assessment & Plan:  asymptomatic.  We will follow-up as needed Formatting of this note might be different from the original. Mild (2018)  Last Assessment & Pla   Atherosclerotic heart disease of native coronary artery without angina pectoris 05/02/2020   Benign hypertension  07/21/2011   Last Assessment & Plan:  Formatting of this note might be different from the original.  We discussed blood pressure above goal today. Previously well controlled. I recommended he continue current medications, focus on lifestyle modifications including low-sodium diet and will reassess in 3 months   Benign prostatic hyperplasia 05/02/2020   CAD (coronary artery disease) 2006   stent LAD 3.0x18 Cypher, occluded RCA   Cancer ([place])    multiple myeloma   Cervical lymphadenopathy 03/31/2016   Colitis 05/12/2016   Coronary artery disease 07/21/2011   stent LAD 3.0x18 Cypher, occluded RCA  Overview:  Stent 2006 at [facility], Low risk stress test feb 2017  Last Assessment & Plan:  Declined further stress testing in 2015 due to cost and absence of symptoms.  Continues to be asymptomatic.  He has upcoming follow-up with cardiology for review. 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  Patient is up-to-date on Covid booster  Infected tooth 03/31/2016   Leukocytosis    Lipoma 07/19/2014   Overview:  Right scalp.  Seen by Payton Mccallum in past  Last Assessment & Plan:  Lipoma on right scalp getting larger, will refer to ENT, but more concerned about lymph node   Lymphadenopathy 11/21/2015   Last Assessment & Plan:  Right submandibular lymph node, non-tender, now larger. CBC 2 months ago showed mild anemia, normal peripheral smear.   Will recheck today along with ESR, sed rate.  Will refer to ENT for further evaluation.    Last Assessment & Plan:   Soft, right-sided postauricular mass, deferred intervention in 2018 at onset of myeloma diagnosis, gradually enlarging over time.  Had cons   Monoclonal gammopathy    Multiple myeloma ([place]) 03/20/2016   Last Assessment & Plan:  Given dx, rec social distancing, avoiding in person jury duty.  Letter provided Formatting of this note might be different from the original. Last Assessment & Plan:  Myeloma treatment is going well, he has upcoming oncology visit   Multiple myeloma in remission ([place]) 07/22/2016   Last Assessment & Plan:  Formatting of this note might be different from the original.  Patient remains in remission, followed by oncology.  Had labs drawn today   Palliative care by specialist    Pancreatitis 03/02/2016   Peripheral neuropathy 07/22/2016   Last Assessment & Plan:  Overall improved. Patient has discontinued gabapentin, did not feel much difference  Feet and fingers.  Multifactorial, DM, Chemo, MM  Last Assessment & Plan:   Doing well on gabapentin Formatting of this note might be different from the original. Last Assessment & Plan:  Overall improved. Patient has discontinued gabapentin, did not feel much difference   Physical deconditioning 06/13/2016   Last Assessment & Plan:  Marked weakness.  Will request further home assistance per home health with bathing.  Discharged on a supplement- patient reports is unaffordable.  Advised prefer food as no barriers, appetite reasonable, ok to use glucerna or carnation instant breakfast if desired.   Pre-operative clearance 01/30/2017   Protein-calorie malnutrition, severe 03/03/2016   Right hip pain 09/27/2019   Last Assessment & Plan:  Formatting of this note might be different from the original.  Recent tests reassuring for no malignancy.  known hip arthritis.  Symptoms today separate from neuropathy.  He plans on starting program  with trainer at [facility], declined PT today for gait and strength.  Will reassess at followup.   Screening for malignant neoplasm of colon 05/02/2020   Sinus tachycardia 05/13/2016   SIRS (systemic inflammatory response syndrome) ([place]) 05/12/2016   Stage 3 chronic kidney disease ([place]) 06/13/2016   Last Assessment & Plan:  Will check Cr/K today  June 2020, annual follow-up [place] [facility]  Last Assessment & Plan:   Recent labs reviewed- stable Formatting of this note might be different from the original. Last Assessment & Plan:  Will check Cr/K today   Type 2 diabetes mellitus ([place]) 07/21/2011   Last Assessment & Plan:   Diabetes improved with fasting glucoses in a safe range.  Continue current regimen. Formatting of this note might be different from the original. Last Assessment & Plan:  Patient's diabetes has been well controlled at baseline on 12 units of Lantus daily.  He reports some hyperglycemia on the first few days after dexamethasone with resolution by the end of the week. We'll   Upper abdominal pain    Urinary retention    Vitamin D deficiency 05/02/2020   Weakness  generalized     . Past Surgical History:  Procedure Laterality Date   CARDIAC CATHETERIZATION  2006   CORONARY ANGIOPLASTY WITH STENT PLACEMENT  2007   IR FLUORO GUIDE CV LINE RIGHT  05/20/2016   IR US GUIDE VASC ACCESS RIGHT  05/20/2016    . Social History   Tobacco Use   Smoking status: Never   Smokeless tobacco: Never  Vaping Use   Vaping Use: Never used  Substance Use Topics   Alcohol use: No   Drug use: No    ALLERGIES:  has No Known Allergies.  MEDICATIONS:  Current Outpatient Medications  Medication Sig Dispense Refill   amLODipine (NORVASC) 10 MG tablet Take 10 mg by mouth daily.     aspirin 81 MG tablet Take 81 mg by mouth 2 (two) times daily.     atorvastatin (LIPITOR) 20 MG tablet Take 1 tablet (20 mg total) by mouth daily. 90 tablet 3   Cholecalciferol 10 MCG (400 UNIT) CAPS Take 1 capsule by mouth  daily.     gabapentin (NEURONTIN) 300 MG capsule Take 300 mg by mouth daily.     LANTUS SOLOSTAR 100 UNIT/ML Solostar Pen Inject 16 Units into the skin daily.     lenalidomide (REVLIMID) 10 MG capsule TAKE 1 CAPSULE BY MOUTH DAILY  FOR 21 DAYS, THEN 7 DAYS OFF 21 capsule 0   lisinopril (ZESTRIL) 2.5 MG tablet Take 2.5 mg by mouth daily.     metoprolol succinate (TOPROL-XL) 100 MG 24 hr tablet Take 100 mg by mouth daily.      nitroGLYCERIN (NITROSTAT) 0.4 MG SL tablet Place 1 tablet (0.4 mg total) under the tongue every 5 (five) minutes x 3 doses as needed for chest pain. 30 tablet 12   vitamin B-12 (CYANOCOBALAMIN) 1000 MCG tablet Take 1,000 mcg by mouth daily.     No current facility-administered medications for this visit.    PHYSICAL EXAMINATION: ECOG PERFORMANCE STATUS: 1 - Symptomatic but completely ambulatory  Vitals:   01/14/21 1039  BP: 136/66  Pulse: 82  Resp: 20  Temp: (!) 97.5 F (36.4 C)  SpO2: 100%     Filed Weights   01/14/21 1039  Weight: 171 lb 6.4 oz (77.7 kg)    .Body mass index is 23.91 kg/m.  Douglas Rose GENERAL:alert, in no acute distress and comfortable SKIN: no acute rashes, no significant lesions EYES: conjunctiva are pink and non-injected, sclera anicteric OROPHARYNX: MMM, no exudates, no oropharyngeal erythema or ulceration NECK: supple, no JVD LYMPH:  no palpable lymphadenopathy in the cervical, axillary or inguinal regions LUNGS: clear to auscultation b/l with normal respiratory effort HEART: regular rate & rhythm ABDOMEN:  normoactive bowel sounds , non tender, not distended. Extremity: no pedal edema PSYCH: alert & oriented x 3 with fluent speech NEURO: no focal motor/sensory deficits   LABORATORY DATA:   I have reviewed the data as listed  CBC Latest Ref Rng & Units 01/08/2021 10/03/2020 07/03/2020  WBC 4.0 - 10.5 K/uL 4.8 3.8(L) 3.6(L)  Hemoglobin 13.0 - 17.0 g/dL 10.8(L) 11.6(L) 10.5(L)  Hematocrit 39.0 - 52.0 % 33.8(L) 36.3(L) 32.8(L)   Platelets 150 - 400 K/uL 269 177 160  ANC 1k  . CMP Latest Ref Rng & Units 01/08/2021 10/03/2020 07/03/2020  Glucose 70 - 99 mg/dL 245(H) 198(H) 166(H)  BUN 8 - 23 mg/dL 23 23 27(H)  Creatinine 0.61 - 1.24 mg/dL 1.61(H) 1.72(H) 1.94(H)  Sodium 135 - 145 mmol/L 134(L) 139 138  Potassium 3.5 - 5.1 mmol/L 4.2  3.8 3.7  Chloride 98 - 111 mmol/L 103 109 105  CO2 22 - 32 mmol/L _0 Calcium 8.9 - 10.3 mg/dL 8.6(L) 8.8(L) 8.7(L)  Total Protein 6.5 - 8.1 g/dL 7.5 7.3 7.1  Total Bilirubin 0.3 - 1.2 mg/dL 0.3 0.5 0.4  Alkaline Phos 38 - 126 U/L 78 69 91  AST 15 - 41 U/L 15 12(L) 14(L)  ALT 0 - 44 U/L _1 RADIOGRAPHIC STUDIES: I have personally reviewed the radiological images as listed and agreed with the findings in the report. No results found.  ASSESSMENT & PLAN:   76 y.o.  with multiple medical comorbidities but fairly good performance status overall with   1) ISS Stage II IgG Kapppa multiple myeloma. Anemia + Renal insufficiency.  Bone survey neg for concerning bone lesions. significantly elevated kappa free light chains 858, lambda 18.4, with a ratio of 46.65 . He was also noted to have an M spike of 1.7 g/dL with IFE showing IgG kappa monoclonal protein. Quantitative IgG level was increased to nearly 2500 mg/dL. 24-hour UPEP showed monoclonal protein of 1300 mg per 24 hours which constituted about 86% of all the urinary protein. Bone marrow biopsy showed 20-30% Restricted plasma cells consistent with multiple myeloma Cytogenetics/FISH  Showed +4, +11, and +17   Patient has been treated with CyBord X 2 cycles . Treatment interrupted due to severe c diff colitis with prolonged hospitalization and decline in performance status.  Patient is much improved and back to baseline now. M protein undetected with neg IFE Has been treated with 5 cycles of Rd  Currently on maintenance Revlikmid and in Remission.  2) Subacute renal failure primary appears to be related to  monoclonal paraproteinemia. Had an element of obstructive uropathy due to BPH which has resolved. Creatinine continues to be stable.   3) Normocytic anemia likely related to multiple myeloma.-stable. No abdominal pain or discomfort over the right upper quadrant. No fevers or chills.  PLAN:  Discussed lab work from 01/08/2021 in details.  CBC and CMP stable. Myeloma panel shows no M spike Serum kappa lambda free light chains stable with stable ratio. Patient has no prohibitive toxicities from his current maintenance Revlimid and we shall continue Revlimid at 10 mg 3 weeks on 1 week off at this time. -Continue aspirin 81 mg p.o. daily -Continue current dose of vitamin D -Patient has no lab or clinical evidence of myeloma progression at this time.   4) DM2 - controlled -continue f/u with PCP.   FOLLOW UP: RTC with Dr Irene Limbo in 3 months. Plz schedule labs 1 week prior to clinic visit    . The total time spent in the appointment was 20 minutes and more than 50% was on counseling and direct patient cares.   All of the patient's questions were answered with apparent satisfaction. The patient knows to call the clinic with any problems, questions or concerns.   Sullivan Lone MD [place] AAHIVMS [facility] Hematology/Oncology Physician [facility]

## 2021-01-21 NOTE — Addendum Note (Signed)
Addended by: Sullivan Lone on: 01/21/2021 04:16 PM   Modules accepted: Orders

## 2021-02-13 ENCOUNTER — Other Ambulatory Visit: Payer: Self-pay

## 2021-02-13 DIAGNOSIS — C9001 Multiple myeloma in remission: Secondary | ICD-10-CM

## 2021-02-13 MED ORDER — LENALIDOMIDE 10 MG PO CAPS: ORAL_CAPSULE | ORAL | 0 refills | Status: DC

## 2021-02-15 ENCOUNTER — Other Ambulatory Visit: Payer: Self-pay | Admitting: Hematology

## 2021-02-15 DIAGNOSIS — C9001 Multiple myeloma in remission: Secondary | ICD-10-CM

## 2021-03-03 ENCOUNTER — Other Ambulatory Visit: Payer: Self-pay | Admitting: Hematology

## 2021-03-03 DIAGNOSIS — C9001 Multiple myeloma in remission: Secondary | ICD-10-CM

## 2021-03-07 ENCOUNTER — Other Ambulatory Visit: Payer: Self-pay

## 2021-03-28 ENCOUNTER — Other Ambulatory Visit: Payer: Self-pay | Admitting: Hematology

## 2021-03-28 DIAGNOSIS — C9001 Multiple myeloma in remission: Secondary | ICD-10-CM

## 2021-04-01 ENCOUNTER — Other Ambulatory Visit: Payer: Self-pay

## 2021-04-16 ENCOUNTER — Other Ambulatory Visit: Payer: Self-pay

## 2021-04-16 ENCOUNTER — Inpatient Hospital Stay: Payer: Medicare HMO | Attending: Hematology

## 2021-04-16 DIAGNOSIS — Z7982 Long term (current) use of aspirin: Secondary | ICD-10-CM | POA: Diagnosis not present

## 2021-04-16 DIAGNOSIS — E119 Type 2 diabetes mellitus without complications: Secondary | ICD-10-CM | POA: Diagnosis not present

## 2021-04-16 DIAGNOSIS — Z79899 Other long term (current) drug therapy: Secondary | ICD-10-CM | POA: Insufficient documentation

## 2021-04-16 DIAGNOSIS — D649 Anemia, unspecified: Secondary | ICD-10-CM | POA: Diagnosis not present

## 2021-04-16 DIAGNOSIS — C9001 Multiple myeloma in remission: Secondary | ICD-10-CM | POA: Insufficient documentation

## 2021-04-16 LAB — CBC WITH DIFFERENTIAL/PLATELET
Abs Immature Granulocytes: 0.01 10*3/uL (ref 0.00–0.07)
Basophils Absolute: 0 10*3/uL (ref 0.0–0.1)
Basophils Relative: 0 %
Eosinophils Absolute: 0.2 10*3/uL (ref 0.0–0.5)
Eosinophils Relative: 4 %
HCT: 35.6 % — ABNORMAL LOW (ref 39.0–52.0)
Hemoglobin: 11.4 g/dL — ABNORMAL LOW (ref 13.0–17.0)
Immature Granulocytes: 0 %
Lymphocytes Relative: 27 %
Lymphs Abs: 1 10*3/uL (ref 0.7–4.0)
MCH: 27.7 pg (ref 26.0–34.0)
MCHC: 32 g/dL (ref 30.0–36.0)
MCV: 86.6 fL (ref 80.0–100.0)
Monocytes Absolute: 0.5 10*3/uL (ref 0.1–1.0)
Monocytes Relative: 12 %
Neutro Abs: 2.2 10*3/uL (ref 1.7–7.7)
Neutrophils Relative %: 57 %
Platelets: 179 10*3/uL (ref 150–400)
RBC: 4.11 MIL/uL — ABNORMAL LOW (ref 4.22–5.81)
RDW: 18.7 % — ABNORMAL HIGH (ref 11.5–15.5)
WBC: 3.9 10*3/uL — ABNORMAL LOW (ref 4.0–10.5)
nRBC: 0 % (ref 0.0–0.2)

## 2021-04-16 LAB — CMP (CANCER CENTER ONLY)
ALT: 19 U/L (ref 0–44)
AST: 16 U/L (ref 15–41)
Albumin: 3.5 g/dL (ref 3.5–5.0)
Alkaline Phosphatase: 92 U/L (ref 38–126)
Anion gap: 6 (ref 5–15)
BUN: 26 mg/dL — ABNORMAL HIGH (ref 8–23)
CO2: 27 mmol/L (ref 22–32)
Calcium: 8.4 mg/dL — ABNORMAL LOW (ref 8.9–10.3)
Chloride: 106 mmol/L (ref 98–111)
Creatinine: 1.48 mg/dL — ABNORMAL HIGH (ref 0.61–1.24)
GFR, Estimated: 48 mL/min — ABNORMAL LOW (ref >60)
Glucose, Bld: 149 mg/dL — ABNORMAL HIGH (ref 70–99)
Potassium: 3.6 mmol/L (ref 3.5–5.1)
Sodium: 139 mmol/L (ref 135–145)
Total Bilirubin: 0.4 mg/dL (ref 0.3–1.2)
Total Protein: 6.8 g/dL (ref 6.5–8.1)

## 2021-04-17 LAB — KAPPA/LAMBDA LIGHT CHAINS
Kappa free light chain: 66.9 mg/L — ABNORMAL HIGH (ref 3.3–19.4)
Kappa, lambda light chain ratio: 2.1 — ABNORMAL HIGH (ref 0.26–1.65)
Lambda free light chains: 31.8 mg/L — ABNORMAL HIGH (ref 5.7–26.3)

## 2021-04-20 ENCOUNTER — Other Ambulatory Visit: Payer: Self-pay | Admitting: Hematology

## 2021-04-20 DIAGNOSIS — C9001 Multiple myeloma in remission: Secondary | ICD-10-CM

## 2021-04-24 ENCOUNTER — Other Ambulatory Visit: Payer: Self-pay

## 2021-04-24 ENCOUNTER — Inpatient Hospital Stay: Payer: Medicare HMO | Admitting: Hematology

## 2021-04-24 VITALS — BP 164/77 | HR 73 | Temp 97.2°F | Resp 18 | Wt 171.1 lb

## 2021-04-24 DIAGNOSIS — C9001 Multiple myeloma in remission: Secondary | ICD-10-CM | POA: Diagnosis not present

## 2021-04-29 NOTE — [place] Notes (Signed)
?. ? ?HEMATOLOGY ONCOLOGY PROGRESS NOTE ? ?Date of service: .04/24/2021 ? ? ?Patient Care Team: ?[name], MD as PCP - General (Family Medicine) ? ?Chief Complaint : Follow-up for continued evaluation and management of multiple myeloma ? ?Diagnosis:  ?IgG Kappa multiple myeloma  ? ? ?Current Treatment:  Revlimid maintenance  ? ?INTERVAL HISTORY: ? ?Douglas Rose is here for continued evaluation and management of light chain multiple myeloma and continued maintenance treatment with Revlimid. ?He notes no acute new symptoms since his last clinic visit. ?No significant infection issues. ?Notes some grade 1 fatigue and grade 1 diarrhea related to his Revlimid. ?Also notes some mild intermittent muscle cramps which have improved. ?No new leg swelling or discomfort. ?No new bone pains. ?Labs done 1 week ago were reviewed with the patient in detail. ? ?REVIEW OF SYSTEMS:   ?10 Point review of Systems was done is negative except as noted above. ? ? ?Past Medical History:  ?Diagnosis Date  ? Abdominal fullness 02/26/2016  ? Last Assessment & Plan:  overall improving with no pain, no further nausea and normal BM today.  No signs of obstruction or infection today.  Due for 10 year colonoscopy, mild anemia in December, will refer to GI  ? Acute renal failure ([place]) 03/06/2016  ? Acute renal failure superimposed on chronic kidney disease ([place]) 03/01/2016  ? AKI (acute kidney injury) ([place])   ? Arterial insufficiency of lower extremity ([place]) 09/01/2016  ? Overview:  Mild (2018)  Last Assessment & Plan:  Mild arterial insufficiency, he does not have classic symptoms of claudication. Advised daily foot checks and would consider further evaluation if new or worsening symptoms  Mild (2018)  Last Assessment & Plan:  asymptomatic.  We will follow-up as needed Formatting of this note might be different from the original. Mild (2018)  Last Assessment & Pla  ? Atherosclerotic heart disease of native coronary artery without angina pectoris  05/02/2020  ? Benign hypertension 07/21/2011  ? Last Assessment & Plan:  Formatting of this note might be different from the original.  We discussed blood pressure above goal today. Previously well controlled. I recommended he continue current medications, focus on lifestyle modifications including low-sodium diet and will reassess in 3 months  ? Benign prostatic hyperplasia 05/02/2020  ? CAD (coronary artery disease) 2006  ? stent LAD 3.0x18 Cypher, occluded RCA  ? Cancer [facility])   ? multiple myeloma  ? Cervical lymphadenopathy 03/31/2016  ? Colitis 05/12/2016  ? Coronary artery disease 07/21/2011  ? stent LAD 3.0x18 Cypher, occluded RCA  Overview:  Stent 2006 at [facility], Low risk stress test feb 2017  Last Assessment & Plan:  Declined further stress testing in 2015 due to cost and absence of symptoms.  Continues to be asymptomatic.  He has upcoming follow-up with cardiology for review. Encouraged to keep appt  Stent 2006 at [facility], Low risk s  ? Diabetes mellitus without complication ([place])   ? DNR (do not resuscitate) discussion   ? Enlarged prostate 03/02/2016  ? Enteritis due to Clostridium difficile   ? Epidermoid cyst of skin 03/31/2016  ? Essential hypertension 05/02/2020  ? Generalized abdominal pain   ? H/O non-insulin dependent diabetes mellitus   ? History of colonic polyps 05/02/2020  ? Hyperlipidemia   ? Hypertension   ? 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 Last Assessment & Plan:  Formatting of this note might be different from the original.  Patient is up-to-date  on Covid booster  ? Infected tooth 03/31/2016  ? Leukocytosis   ? Lipoma 07/19/2014  ? Overview:  Right scalp.  Seen by Payton Mccallum in past  Last Assessment & Plan:  Lipoma on right scalp getting larger, will refer to ENT, but more concerned about lymph node  ? Lymphadenopathy 11/21/2015  ? Last Assessment & Plan:  Right submandibular lymph node, non-tender, now larger. CBC 2 months ago showed mild  anemia, normal peripheral smear.  Will recheck today along with ESR, sed rate.  Will refer to ENT for further evaluation.    Last Assessment & Plan:   Soft, right-sided postauricular mass, deferred intervention in 2018 at onset of myeloma diagnosis, gradually enlarging over time.  Had cons  ? Monoclonal gammopathy   ? Multiple myeloma ([place]) 03/20/2016  ? Last Assessment & Plan:  Given dx, rec social distancing, avoiding in person jury duty.  Letter provided Formatting of this note might be different from the original. Last Assessment & Plan:  Myeloma treatment is going well, he has upcoming oncology visit  ? Multiple myeloma in remission ([place]) 07/22/2016  ? Last Assessment & Plan:  Formatting of this note might be different from the original.  Patient remains in remission, followed by oncology.  Had labs drawn today  ? Palliative care by specialist   ? Pancreatitis 03/02/2016  ? Peripheral neuropathy 07/22/2016  ? Last Assessment & Plan:  Overall improved. Patient has discontinued gabapentin, did not feel much difference  Feet and fingers.  Multifactorial, DM, Chemo, MM  Last Assessment & Plan:   Doing well on gabapentin Formatting of this note might be different from the original. Last Assessment & Plan:  Overall improved. Patient has discontinued gabapentin, did not feel much difference  ? Physical deconditioning 06/13/2016  ? Last Assessment & Plan:  Marked weakness.  Will request further home assistance per home health with bathing.  Discharged on a supplement- patient reports is unaffordable.  Advised prefer food as no barriers, appetite reasonable, ok to use glucerna or carnation instant breakfast if desired.  ? Pre-operative clearance 01/30/2017  ? Protein-calorie malnutrition, severe 03/03/2016  ? Right hip pain 09/27/2019  ? Last Assessment & Plan:  Formatting of this note might be different from the original.  Recent tests reassuring for no malignancy.  known hip arthritis.  Symptoms today separate from neuropathy.   He plans on starting program with trainer at [facility], declined PT today for gait and strength.  Will reassess at followup.  ? Screening for malignant neoplasm of colon 05/02/2020  ? Sinus tachycardia 05/13/2016  ? SIRS (systemic inflammatory response syndrome) ([place]) 05/12/2016  ? Stage 3 chronic kidney disease ([place]) 06/13/2016  ? Last Assessment & Plan:  Will check Cr/K today  June 2020, annual follow-up [place] [facility]  Last Assessment & Plan:   Recent labs reviewed- stable Formatting of this note might be different from the original. Last Assessment & Plan:  Will check Cr/K today  ? Type 2 diabetes mellitus ([place]) 07/21/2011  ? Last Assessment & Plan:   Diabetes improved with fasting glucoses in a safe range.  Continue current regimen. Formatting of this note might be different from the original. Last Assessment & Plan:  Patient's diabetes has been well controlled at baseline on 12 units of Lantus daily.  He reports some hyperglycemia on the first few days after dexamethasone with resolution by the end of the week. We'll  ? Upper abdominal pain   ? Urinary retention   ? Vitamin D  deficiency 05/02/2020  ? Weakness generalized   ? ? ?. ?Past Surgical History:  ?Procedure Laterality Date  ? CARDIAC CATHETERIZATION  2006  ? CORONARY ANGIOPLASTY WITH STENT PLACEMENT  2007  ? IR FLUORO GUIDE CV LINE RIGHT  05/20/2016  ? IR US GUIDE VASC ACCESS RIGHT  05/20/2016  ? ? ?. ?Social History  ? ?Tobacco Use  ? Smoking status: Never  ? Smokeless tobacco: Never  ?Vaping Use  ? Vaping Use: Never used  ?Substance Use Topics  ? Alcohol use: No  ? Drug use: No  ? ? ?ALLERGIES:  has No Known Allergies. ? ?MEDICATIONS:  ?Current Outpatient Medications  ?Medication Sig Dispense Refill  ? lenalidomide (REVLIMID) 10 MG capsule TAKE 1 CAPSULE BY MOUTH EVERY DAY FOR 21 DAYS THEN 7 DAYS OFF 21 capsule 0  ? amLODipine (NORVASC) 10 MG tablet Take 10 mg by mouth daily.    ? aspirin 81 MG tablet Take 81 mg by mouth 2 (two) times daily.    ? atorvastatin  (LIPITOR) 20 MG tablet Take 1 tablet (20 mg total) by mouth daily. 90 tablet 3  ? Cholecalciferol 10 MCG (400 UNIT) CAPS Take 1 capsule by mouth daily.    ? gabapentin (NEURONTIN) 300 MG capsule Take 300 mg

## 2021-05-16 ENCOUNTER — Other Ambulatory Visit: Payer: Self-pay | Admitting: Hematology

## 2021-05-16 DIAGNOSIS — C9001 Multiple myeloma in remission: Secondary | ICD-10-CM

## 2021-05-20 NOTE — Telephone Encounter (Signed)
Refill per most recent MD OV note  ?

## 2021-06-08 ENCOUNTER — Other Ambulatory Visit: Payer: Self-pay | Admitting: Hematology

## 2021-06-08 DIAGNOSIS — C9001 Multiple myeloma in remission: Secondary | ICD-10-CM

## 2021-06-10 ENCOUNTER — Telehealth: Payer: Self-pay | Admitting: Hematology

## 2021-06-10 NOTE — Telephone Encounter (Signed)
Called pt to discuss upcoming appts per 6/1 inbasket , unable to leave msg

## 2021-06-13 ENCOUNTER — Other Ambulatory Visit: Payer: Self-pay

## 2021-07-07 ENCOUNTER — Other Ambulatory Visit: Payer: Self-pay | Admitting: Hematology

## 2021-07-07 DIAGNOSIS — C9001 Multiple myeloma in remission: Secondary | ICD-10-CM

## 2021-07-11 ENCOUNTER — Other Ambulatory Visit: Payer: Self-pay

## 2021-07-15 ENCOUNTER — Telehealth: Payer: Self-pay | Admitting: Hematology

## 2021-07-15 NOTE — Telephone Encounter (Signed)
Rescheduled upcoming appointments due to provider's PAL. Patient's wife is aware of changes.

## 2021-07-16 ENCOUNTER — Other Ambulatory Visit: Payer: Medicare HMO

## 2021-07-17 ENCOUNTER — Encounter (HOSPITAL_COMMUNITY): Payer: Self-pay

## 2021-07-17 ENCOUNTER — Emergency Department (HOSPITAL_COMMUNITY): Payer: Medicare HMO

## 2021-07-17 ENCOUNTER — Other Ambulatory Visit: Payer: Self-pay

## 2021-07-17 ENCOUNTER — Emergency Department (HOSPITAL_COMMUNITY)
Admission: EM | Admit: 2021-07-17 | Discharge: 2021-07-17 | Disposition: A | Discharge status: Home / Self Care | Payer: Medicare HMO | Attending: Emergency Medicine | Admitting: Emergency Medicine

## 2021-07-17 ENCOUNTER — Other Ambulatory Visit: Payer: Medicare HMO

## 2021-07-17 DIAGNOSIS — N183 Chronic kidney disease, stage 3 unspecified: Secondary | ICD-10-CM | POA: Diagnosis not present

## 2021-07-17 DIAGNOSIS — E119 Type 2 diabetes mellitus without complications: Secondary | ICD-10-CM | POA: Insufficient documentation

## 2021-07-17 DIAGNOSIS — U071 COVID-19: Secondary | ICD-10-CM | POA: Diagnosis not present

## 2021-07-17 DIAGNOSIS — Z794 Long term (current) use of insulin: Secondary | ICD-10-CM | POA: Diagnosis not present

## 2021-07-17 DIAGNOSIS — R42 Dizziness and giddiness: Secondary | ICD-10-CM | POA: Insufficient documentation

## 2021-07-17 DIAGNOSIS — Z79899 Other long term (current) drug therapy: Secondary | ICD-10-CM | POA: Diagnosis not present

## 2021-07-17 DIAGNOSIS — R531 Weakness: Secondary | ICD-10-CM | POA: Diagnosis not present

## 2021-07-17 DIAGNOSIS — I129 Hypertensive chronic kidney disease with stage 1 through stage 4 chronic kidney disease, or unspecified chronic kidney disease: Secondary | ICD-10-CM | POA: Diagnosis not present

## 2021-07-17 DIAGNOSIS — Z7982 Long term (current) use of aspirin: Secondary | ICD-10-CM | POA: Insufficient documentation

## 2021-07-17 DIAGNOSIS — R059 Cough, unspecified: Secondary | ICD-10-CM | POA: Diagnosis present

## 2021-07-17 LAB — CBC WITH DIFFERENTIAL/PLATELET
Abs Immature Granulocytes: 0.03 10*3/uL (ref 0.00–0.07)
Basophils Absolute: 0 10*3/uL (ref 0.0–0.1)
Basophils Relative: 1 %
Eosinophils Absolute: 0 10*3/uL (ref 0.0–0.5)
Eosinophils Relative: 0 %
HCT: 34.8 % — ABNORMAL LOW (ref 39.0–52.0)
Hemoglobin: 11.3 g/dL — ABNORMAL LOW (ref 13.0–17.0)
Immature Granulocytes: 1 %
Lymphocytes Relative: 17 %
Lymphs Abs: 0.7 10*3/uL (ref 0.7–4.0)
MCH: 28.5 pg (ref 26.0–34.0)
MCHC: 32.5 g/dL (ref 30.0–36.0)
MCV: 87.9 fL (ref 80.0–100.0)
Monocytes Absolute: 0.7 10*3/uL (ref 0.1–1.0)
Monocytes Relative: 17 %
Neutro Abs: 2.6 10*3/uL (ref 1.7–7.7)
Neutrophils Relative %: 64 %
Platelets: 195 10*3/uL (ref 150–400)
RBC: 3.96 MIL/uL — ABNORMAL LOW (ref 4.22–5.81)
RDW: 18.4 % — ABNORMAL HIGH (ref 11.5–15.5)
WBC: 4 10*3/uL (ref 4.0–10.5)
nRBC: 0 % (ref 0.0–0.2)

## 2021-07-17 LAB — COMPREHENSIVE METABOLIC PANEL WITH GFR
ALT: 23 U/L (ref 0–44)
AST: 23 U/L (ref 15–41)
Albumin: 3.1 g/dL — ABNORMAL LOW (ref 3.5–5.0)
Alkaline Phosphatase: 84 U/L (ref 38–126)
Anion gap: 7 (ref 5–15)
BUN: 15 mg/dL (ref 8–23)
CO2: 25 mmol/L (ref 22–32)
Calcium: 8.8 mg/dL — ABNORMAL LOW (ref 8.9–10.3)
Chloride: 108 mmol/L (ref 98–111)
Creatinine, Ser: 1.48 mg/dL — ABNORMAL HIGH (ref 0.61–1.24)
GFR, Estimated: 48 mL/min — ABNORMAL LOW
Glucose, Bld: 98 mg/dL (ref 70–99)
Potassium: 3.4 mmol/L — ABNORMAL LOW (ref 3.5–5.1)
Sodium: 140 mmol/L (ref 135–145)
Total Bilirubin: 0.8 mg/dL (ref 0.3–1.2)
Total Protein: 7.7 g/dL (ref 6.5–8.1)

## 2021-07-17 LAB — LACTIC ACID, PLASMA: Lactic Acid, Venous: 0.9 mmol/L (ref 0.5–1.9)

## 2021-07-17 LAB — SARS CORONAVIRUS 2 BY RT PCR: SARS Coronavirus 2 by RT PCR: POSITIVE — AB

## 2021-07-17 MED ORDER — NIRMATRELVIR/RITONAVIR (PAXLOVID) TABLET (RENAL DOSING): 2.0000 | ORAL_TABLET | Freq: Two times a day (BID) | ORAL | 0 refills | Status: AC

## 2021-07-17 MED ORDER — SODIUM CHLORIDE 0.9 % IV BOLUS
1000.0000 mL | Freq: Once | INTRAVENOUS | Status: AC
  Administered 2021-07-17: 1000 mL via INTRAVENOUS

## 2021-07-17 NOTE — Discharge Instructions (Addendum)
Thank you for allowing [name] to care for you.  You were found to be positive for COVID-19.  Please read the attached information about COVID-19, supportive care, quarantine process, and when to seek further care if needed.  We have prescribed you an antiviral medication called Paxlovid. You can pick this up at your pharmacy and complete a 5-day course.

## 2021-07-17 NOTE — ED Triage Notes (Signed)
BIBA from home for generalized weakness since Sat, cancer pt in remission, has fever that started today, slid off couch, no loc did not hit head,   159/72 80 30 100% RA 100.2 CBG 99 20 RH

## 2021-07-17 NOTE — ED Provider Notes (Signed)
[facility] DEPT Provider Note   CSN: [id number] Arrival date & time: 07/17/21  2039     History CKD stage III, T2DM, HTN, Hx of multiple myeloma in remission  Chief Complaint  Patient presents with   Weakness    Douglas Rose. is a 76 y.o. male.  PT states he lying in bed this evening and was getting up to urinate but felt dizzy like the room was spinning, had leg and arm weakness and slid out of the bed. He denies hitting his head or falling hard. He couldn't get up, states he could move his legs but was in a small space between the bed and the wall.  His wife called EMS.   He admits to having similar dizzy episodes about every other day over the past, worse with moving his head rapidly. States he vomited twice last night, he is unsure why he stomach was upset but he didn't vomit again was wasn't nauseous when he woke up this morning. He denies blood in his vomit. Admits to a mild dry cough just today and rhinorrhea yesterday.      Weakness Associated symptoms: cough, diarrhea, dizziness, nausea and vomiting   Associated symptoms: no abdominal pain, no chest pain, no fever, no headaches and no shortness of breath        Home Medications Prior to Admission medications   Medication Sig Start Date End Date Taking? Authorizing Provider  amLODipine (NORVASC) 10 MG tablet Take 10 mg by mouth daily.   Yes [provider]  aspirin 81 MG tablet Take 81 mg by mouth 2 (two) times daily.   Yes [provider]  atorvastatin (LIPITOR) 20 MG tablet Take 1 tablet (20 mg total) by mouth daily. 12/06/12  Yes Burtis Junes, NP  Cholecalciferol 10 MCG (400 UNIT) CAPS Take 1 capsule by mouth daily.   Yes [provider]  gabapentin (NEURONTIN) 300 MG capsule Take 300 mg by mouth daily. 08/16/19  Yes [provider]  LANTUS SOLOSTAR 100 UNIT/ML Solostar Pen Inject 16 Units into the skin daily. 11/30/20  Yes [provider]  lenalidomide (REVLIMID) 10 MG capsule TAKE 1 CAPSULE BY MOUTH EVERY DAY FOR 21 DAYS THEN 7 DAYS OFF Patient taking differently: 10 mg daily. TAKE 1 CAPSULE BY MOUTH EVERY DAY FOR 21 DAYS THEN 7 DAYS OFF 07/11/21  Yes Brunetta Genera, MD  lisinopril (ZESTRIL) 2.5 MG tablet Take 2.5 mg by mouth daily. 09/30/19  Yes [provider]  metoprolol succinate (TOPROL-XL) 100 MG 24 hr tablet Take 100 mg by mouth daily.  08/29/19  Yes [provider]  nirmatrelvir/ritonavir EUA, renal dosing, (PAXLOVID) 10 x 150 MG & 10 x 100MG TABS Take 2 tablets by mouth 2 (two) times daily for 5 days. Patient GFR is 48. Take nirmatrelvir (150 mg) one tablet twice daily for 5 days and ritonavir (100 mg) one tablet twice daily for 5 days. 07/17/21 07/22/21 Yes Precious Gilding, DO  nitroGLYCERIN (NITROSTAT) 0.4 MG SL tablet Place 1 tablet (0.4 mg total) under the tongue every 5 (five) minutes x 3 doses as needed for chest pain. 11/13/12  Yes Edmisten, Brooke O, PA-C  vitamin B-12 (CYANOCOBALAMIN) 1000 MCG tablet Take 1,000 mcg by mouth daily.   Yes [provider]  amoxicillin-clavulanate (AUGMENTIN) 875-125 MG tablet Take 1 tablet by mouth 2 (two) times daily. 07/16/21   [provider]      Allergies    Patient has no known  allergies.    Review of Systems   Review of Systems  Constitutional:  Positive for appetite change. Negative for fatigue and fever.  HENT:  Positive for rhinorrhea.   Respiratory:  Positive for cough. Negative for shortness of breath.   Cardiovascular:  Negative for chest pain.  Gastrointestinal:  Positive for diarrhea, nausea and vomiting. Negative for abdominal pain and constipation.  Genitourinary:  Negative for difficulty urinating.  Neurological:  Positive for dizziness and weakness. Negative for syncope and headaches.  Psychiatric/Behavioral:  Negative for confusion.     Physical Exam Updated Vital Signs BP (!) 142/74   Pulse 72   Temp 99.3  F (37.4 C) (Oral)   Resp (!) 21   Ht _0  (1.803 m)   Wt 77.6 kg   SpO2 100%   BMI 23.85 kg/m  Physical Exam Constitutional:      Appearance: He is not ill-appearing, toxic-appearing or diaphoretic.  HENT:     Mouth/Throat:     Mouth: Mucous membranes are moist.  Eyes:     Extraocular Movements: Extraocular movements intact.     Conjunctiva/sclera: Conjunctivae normal.     Pupils: Pupils are equal, round, and reactive to light.  Cardiovascular:     Rate and Rhythm: Normal rate and regular rhythm.  Pulmonary:     Effort: Pulmonary effort is normal.     Breath sounds: Normal breath sounds.  Abdominal:     General: Abdomen is flat. Bowel sounds are normal. There is no distension.     Palpations: Abdomen is soft.     Tenderness: There is no abdominal tenderness.  Musculoskeletal:     Right lower leg: No edema.     Left lower leg: No edema.  Neurological:     Mental Status: He is alert and oriented to person, place, and time.     Cranial Nerves: Cranial nerves 2-12 are intact.     Sensory: Sensation is intact.     Motor: No weakness.     Coordination: Finger-Nose-Finger Test normal.     ED Results / Procedures / Treatments   Labs (all labs ordered are listed, but only abnormal results are displayed) Labs Reviewed  SARS CORONAVIRUS 2 BY RT PCR - Abnormal; Notable for the following components:      Result Value   SARS Coronavirus 2 by RT PCR POSITIVE (*)    All other components within normal limits  CBC WITH DIFFERENTIAL/PLATELET - Abnormal; Notable for the following components:   RBC 3.96 (*)    Hemoglobin 11.3 (*)    HCT 34.8 (*)    RDW 18.4 (*)    All other components within normal limits  COMPREHENSIVE METABOLIC PANEL - Abnormal; Notable for the following components:   Potassium 3.4 (*)    Creatinine, Ser 1.48 (*)    Calcium 8.8 (*)    Albumin 3.1 (*)    GFR, Estimated 48 (*)    All other components within normal limits  CULTURE, BLOOD (ROUTINE X 2)   CULTURE, BLOOD (ROUTINE X 2)  URINE CULTURE  LACTIC ACID, PLASMA  LACTIC ACID, PLASMA  URINALYSIS, ROUTINE W REFLEX MICROSCOPIC    EKG None  Radiology CT HEAD WO CONTRAST (5MM)  Result Date: 07/17/2021 CLINICAL DATA:  Dizziness EXAM: CT HEAD WITHOUT CONTRAST TECHNIQUE: Contiguous axial images were obtained from the base of the skull through the vertex without intravenous contrast. RADIATION DOSE REDUCTION: This exam was performed according to the departmental dose-optimization program which includes automated exposure control, adjustment  of the mA and/or kV according to patient size and/or use of iterative reconstruction technique. COMPARISON:  None Available. FINDINGS: Brain: No acute territorial infarction, hemorrhage or intracranial mass. Patchy white matter hypodensity consistent with chronic small vessel ischemic change. The ventricles are nonenlarged. Vascular: No hyperdense vessels.  Carotid vascular calcification Skull: No fracture. Possible osteoma along the inner table of the left frontal bone. Sinuses/Orbits: No acute finding. Other: None IMPRESSION: 1. No CT evidence for acute intracranial abnormality. 2. Chronic small vessel ischemic changes of the white matter Electronically Signed   By: Donavan Foil M.D.   On: 07/17/2021 22:19   DG Chest Port 1 View  Result Date: 07/17/2021 CLINICAL DATA:  Weakness and fever. EXAM: PORTABLE CHEST 1 VIEW COMPARISON:  Portable chest 05/17/2016. FINDINGS: There is mild-to-moderate cardiomegaly but no findings of CHF. There is calcification in the aortic arch, mild aortic tortuosity and otherwise unremarkable mediastinum. The lungs are clear. No pleural effusion is seen. Slight thoracic levoscoliosis. IMPRESSION: No evidence of acute chest disease.  Cardiomegaly. Aortic atherosclerosis. Compare: Interval resolution of pleural effusions and lung base opacities previously noted. Electronically Signed   By: Telford Nab M.D.   On: 07/17/2021 21:20     Procedures Procedures  none  Medications Ordered in ED Medications  sodium chloride 0.9 % bolus 1,000 mL (1,000 mLs Intravenous New Bag/Given 07/17/21 2210)    ED Course/ Medical Decision Making/ A&P                           Medical Decision Making Pt with past medical history significant for CKD 3 and multiple myeloma in remission presents after a brief episode of dizziness and generalized weakness.  Chest x-ray showed no acute process and head CT showed no acute abnormality ruling out intracranial process as cause of dizziness.  CBC showed no leukocytosis, CMP unremarkable, lactic acid WNL, blood cultures drawn, and patient tested positive for COVID-19.  COVID-19 infection is likely cause of the patient's symptoms. VSS, physical exam is reassuring, he is alert and overall well-appearing with no need for hospital admission.  Patient was given 1 L bolus of normal saline and was discharged with information on COVID-19, quarantine process, supportive care, return precautions, and a prescription for Paxlovid with instructions to follow-up with his primary care doctor in 1 week.  Amount and/or Complexity of Data Reviewed Labs: ordered. Radiology: ordered.   Final Clinical Impression(s) / ED Diagnoses Final diagnoses:  [id number]    Rx / DC Orders ED Discharge Orders          Ordered    nirmatrelvir/ritonavir EUA, renal dosing, (PAXLOVID) 10 x 150 MG & 10 x 100MG TABS  2 times daily        07/17/21 2312              Precious Gilding, DO 07/17/21 2340    Drenda Freeze, MD 07/18/21 1501

## 2021-07-22 LAB — CULTURE, BLOOD (ROUTINE X 2)
Culture: NO GROWTH
Culture: NO GROWTH
Special Requests: ADEQUATE
Special Requests: ADEQUATE

## 2021-07-23 ENCOUNTER — Inpatient Hospital Stay: Payer: Medicare HMO | Admitting: Hematology

## 2021-08-03 ENCOUNTER — Other Ambulatory Visit: Payer: Self-pay | Admitting: Hematology

## 2021-08-03 DIAGNOSIS — C9001 Multiple myeloma in remission: Secondary | ICD-10-CM

## 2021-08-05 ENCOUNTER — Inpatient Hospital Stay: Payer: Medicare HMO | Attending: Hematology

## 2021-08-05 ENCOUNTER — Other Ambulatory Visit: Payer: Self-pay

## 2021-08-05 DIAGNOSIS — C9001 Multiple myeloma in remission: Secondary | ICD-10-CM | POA: Diagnosis present

## 2021-08-05 DIAGNOSIS — Z79899 Other long term (current) drug therapy: Secondary | ICD-10-CM | POA: Diagnosis not present

## 2021-08-05 DIAGNOSIS — Z7982 Long term (current) use of aspirin: Secondary | ICD-10-CM | POA: Diagnosis not present

## 2021-08-05 DIAGNOSIS — D649 Anemia, unspecified: Secondary | ICD-10-CM | POA: Diagnosis not present

## 2021-08-05 DIAGNOSIS — E119 Type 2 diabetes mellitus without complications: Secondary | ICD-10-CM | POA: Diagnosis not present

## 2021-08-05 LAB — CBC WITH DIFFERENTIAL (CANCER CENTER ONLY)
Abs Immature Granulocytes: 0.02 10*3/uL (ref 0.00–0.07)
Basophils Absolute: 0 10*3/uL (ref 0.0–0.1)
Basophils Relative: 0 %
Eosinophils Absolute: 0.2 10*3/uL (ref 0.0–0.5)
Eosinophils Relative: 7 %
HCT: 34.2 % — ABNORMAL LOW (ref 39.0–52.0)
Hemoglobin: 11.2 g/dL — ABNORMAL LOW (ref 13.0–17.0)
Immature Granulocytes: 1 %
Lymphocytes Relative: 35 %
Lymphs Abs: 1 10*3/uL (ref 0.7–4.0)
MCH: 28.4 pg (ref 26.0–34.0)
MCHC: 32.7 g/dL (ref 30.0–36.0)
MCV: 86.8 fL (ref 80.0–100.0)
Monocytes Absolute: 0.4 10*3/uL (ref 0.1–1.0)
Monocytes Relative: 14 %
Neutro Abs: 1.3 10*3/uL — ABNORMAL LOW (ref 1.7–7.7)
Neutrophils Relative %: 43 %
Platelet Count: 160 10*3/uL (ref 150–400)
RBC: 3.94 MIL/uL — ABNORMAL LOW (ref 4.22–5.81)
RDW: 18.6 % — ABNORMAL HIGH (ref 11.5–15.5)
WBC Count: 2.9 10*3/uL — ABNORMAL LOW (ref 4.0–10.5)
nRBC: 0 % (ref 0.0–0.2)

## 2021-08-05 LAB — CMP (CANCER CENTER ONLY)
ALT: 17 U/L (ref 0–44)
AST: 15 U/L (ref 15–41)
Albumin: 3.5 g/dL (ref 3.5–5.0)
Alkaline Phosphatase: 73 U/L (ref 38–126)
Anion gap: 5 (ref 5–15)
BUN: 10 mg/dL (ref 8–23)
CO2: 29 mmol/L (ref 22–32)
Calcium: 8.5 mg/dL — ABNORMAL LOW (ref 8.9–10.3)
Chloride: 105 mmol/L (ref 98–111)
Creatinine: 1.38 mg/dL — ABNORMAL HIGH (ref 0.61–1.24)
GFR, Estimated: 53 mL/min — ABNORMAL LOW (ref >60)
Glucose, Bld: 194 mg/dL — ABNORMAL HIGH (ref 70–99)
Potassium: 3.7 mmol/L (ref 3.5–5.1)
Sodium: 139 mmol/L (ref 135–145)
Total Bilirubin: 0.3 mg/dL (ref 0.3–1.2)
Total Protein: 7.5 g/dL (ref 6.5–8.1)

## 2021-08-06 LAB — KAPPA/LAMBDA LIGHT CHAINS
Kappa free light chain: 77.3 mg/L — ABNORMAL HIGH (ref 3.3–19.4)
Kappa, lambda light chain ratio: 1.78 — ABNORMAL HIGH (ref 0.26–1.65)
Lambda free light chains: 43.5 mg/L — ABNORMAL HIGH (ref 5.7–26.3)

## 2021-08-07 ENCOUNTER — Other Ambulatory Visit: Payer: Self-pay

## 2021-08-07 LAB — MULTIPLE MYELOMA PANEL, SERUM
Albumin SerPl Elph-Mcnc: 3.1 g/dL (ref 2.9–4.4)
Albumin/Glob SerPl: 0.9 (ref 0.7–1.7)
Alpha 1: 0.2 g/dL (ref 0.0–0.4)
Alpha2 Glob SerPl Elph-Mcnc: 0.8 g/dL (ref 0.4–1.0)
B-Globulin SerPl Elph-Mcnc: 0.9 g/dL (ref 0.7–1.3)
Gamma Glob SerPl Elph-Mcnc: 1.7 g/dL (ref 0.4–1.8)
Globulin, Total: 3.7 g/dL (ref 2.2–3.9)
IgA: 434 mg/dL (ref 61–437)
IgG (Immunoglobin G), Serum: 1760 mg/dL — ABNORMAL HIGH (ref 603–1613)
IgM (Immunoglobulin M), Srm: 34 mg/dL (ref 15–143)
Total Protein ELP: 6.8 g/dL (ref 6.0–8.5)

## 2021-08-12 ENCOUNTER — Inpatient Hospital Stay: Payer: Medicare HMO | Admitting: Hematology

## 2021-08-19 ENCOUNTER — Inpatient Hospital Stay: Payer: Medicare HMO | Attending: Hematology | Admitting: Hematology

## 2021-08-19 DIAGNOSIS — C9000 Multiple myeloma not having achieved remission: Secondary | ICD-10-CM | POA: Diagnosis not present

## 2021-08-19 DIAGNOSIS — C9001 Multiple myeloma in remission: Secondary | ICD-10-CM | POA: Insufficient documentation

## 2021-08-19 DIAGNOSIS — Z79899 Other long term (current) drug therapy: Secondary | ICD-10-CM | POA: Insufficient documentation

## 2021-08-19 NOTE — Progress Notes (Signed)
[name]  HEMATOLOGY ONCOLOGY PHONE VISIT NOTE  Date of service: .08/19/2021   Patient Care Team: [name], MD as PCP - General (Family Medicine)  Chief Complaint : Follow-up for continued valuation and management of multiple myeloma  Diagnosis:  IgG Kappa multiple myeloma    Current Treatment:  Revlimid maintenance   INTERVAL HISTORY:  .[name] KitchenI connected with Douglas Rose. on 08/19/2021 at  8:40 AM EDT by telephone visit and verified that I am speaking with the correct person using two identifiers.   I discussed the limitations, risks, security and privacy concerns of performing an evaluation and management service by telemedicine and the availability of in-person appointments. I also discussed with the patient that there may be a patient responsible charge related to this service. The patient expressed understanding and agreed to proceed.   Other persons participating in the visit and their role in the encounter:   Patient's location: Home Provider's location: [place]  Chief Complaint: Follow-up for multiple myeloma  Patient notes that he is overall feeling well.  He does note that he has some dry scaly skin over his trunk and extremities.  He also notes some itchy skin over his left leg.  No significant redness pain or raised lesions. He was offered a referral to dermatology but wants to hold off on this. We discussed taking lukewarm showers showers, patting dry and applying generous lotion for dry skin. He denies any other new medications new laundry detergent or new cosmetics that would cause a rash. He notes that he will call us if there are leg rash gets worse and will immediately stop the Revlimid. He notes that this has been nonprogressive over the last 1 or 2 months. Labs done on 08/05/2021 were discussed in details with the patient.  REVIEW OF SYSTEMS:   10 Point review of Systems was done is negative except as noted above.   Past Medical  History:  Diagnosis Date   Abdominal fullness 02/26/2016   Last Assessment & Plan:  overall improving with no pain, no further nausea and normal BM today.  No signs of obstruction or infection today.  Due for 10 year colonoscopy, mild anemia in December, will refer to GI   Acute renal failure ([place]) 03/06/2016   Acute renal failure superimposed on chronic kidney disease ([place]) 03/01/2016   AKI (acute kidney injury) ([place])    Arterial insufficiency of lower extremity ([place]) 09/01/2016   Overview:  Mild (2018)  Last Assessment & Plan:  Mild arterial insufficiency, he does not have classic symptoms of claudication. Advised daily foot checks and would consider further evaluation if new or worsening symptoms  Mild (2018)  Last Assessment & Plan:  asymptomatic.  We will follow-up as needed Formatting of this note might be different from the original. Mild (2018)  Last Assessment & Pla   Atherosclerotic heart disease of native coronary artery without angina pectoris 05/02/2020   Benign hypertension 07/21/2011   Last Assessment & Plan:  Formatting of this note might be different from the original.  We discussed blood pressure above goal today. Previously well controlled. I recommended he continue current medications, focus on lifestyle modifications including low-sodium diet and will reassess in 3 months   Benign prostatic hyperplasia 05/02/2020   CAD (coronary artery disease) 2006   stent LAD 3.0x18 [place], occluded RCA   Cancer [facility])    multiple myeloma   Cervical lymphadenopathy 03/31/2016   Colitis 05/12/2016   Coronary artery disease 07/21/2011  stent LAD 3.0x18 [place], occluded RCA  Overview:  Stent 2006 at [facility], Low risk stress test feb 2017  Last Assessment & Plan:  Declined further stress testing in 2015 due to cost and absence of symptoms.  Continues to be asymptomatic.  He has upcoming follow-up with cardiology for review. 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  Patient is up-to-date on Covid booster   Infected tooth 03/31/2016   Leukocytosis    Lipoma 07/19/2014   Overview:  Right scalp.  Seen by Payton Mccallum in past  Last Assessment & Plan:  Lipoma on right scalp getting larger, will refer to ENT, but more concerned about lymph node   Lymphadenopathy 11/21/2015   Last Assessment & Plan:  Right submandibular lymph node, non-tender, now larger. CBC 2 months ago showed mild anemia, normal peripheral smear.  Will recheck today along with ESR, sed rate.  Will refer to ENT for further evaluation.    Last Assessment & Plan:   Soft, right-sided postauricular mass, deferred intervention in 2018 at onset of myeloma diagnosis, gradually enlarging over time.  Had cons   Monoclonal gammopathy    Multiple myeloma ([place]) 03/20/2016   Last Assessment & Plan:  Given dx, rec social distancing, avoiding in person jury duty.  Letter provided Formatting of this note might be different from the original. Last Assessment & Plan:  Myeloma treatment is going well, he has upcoming oncology visit   Multiple myeloma in remission ([place]) 07/22/2016   Last Assessment & Plan:  Formatting of this note might be different from the original.  Patient remains in remission, followed by oncology.  Had labs drawn today   Palliative care by specialist    Pancreatitis 03/02/2016   Peripheral neuropathy 07/22/2016   Last Assessment & Plan:  Overall improved.  Patient has discontinued gabapentin, did not feel much difference  Feet and fingers.  Multifactorial, DM, Chemo, MM  Last Assessment & Plan:   Doing well on gabapentin Formatting of this note might be different from the original. Last Assessment & Plan:  Overall improved. Patient has discontinued gabapentin, did not feel much difference   Physical deconditioning 06/13/2016   Last Assessment & Plan:  Marked weakness.  Will request further home assistance per home health with bathing.  Discharged on a supplement- patient reports is unaffordable.  Advised prefer food as no barriers, appetite reasonable, ok to use glucerna or carnation instant breakfast if desired.   Pre-operative clearance 01/30/2017   Protein-calorie malnutrition, severe 03/03/2016   Right hip pain 09/27/2019   Last Assessment & Plan:  Formatting of this note might be different from the original.  Recent tests reassuring for no malignancy.  known hip arthritis.  Symptoms today separate from neuropathy.  He plans on starting program with trainer [facility], declined PT today for gait and strength.  Will reassess at followup.   Screening for malignant  neoplasm of colon 05/02/2020   Sinus tachycardia 05/13/2016   SIRS (systemic inflammatory response syndrome) ([place]) 05/12/2016   Stage 3 chronic kidney disease ([place]) 06/13/2016   Last Assessment & Plan:  Will check Cr/K today  June 2020, annual follow-up [place] [facility]  Last Assessment & Plan:   Recent labs reviewed- stable Formatting of this note might be different from the original. Last Assessment & Plan:  Will check Cr/K today   Type 2 diabetes mellitus ([place]) 07/21/2011   Last Assessment & Plan:   Diabetes improved with fasting glucoses in a safe range.  Continue current regimen. Formatting of this note might be different from the original. Last Assessment & Plan:  Patient's diabetes has been well controlled at baseline on 12 units of Lantus daily.  He reports some hyperglycemia on the first few days  after dexamethasone with resolution by the end of the week. We'll   Upper abdominal pain    Urinary retention    Vitamin D deficiency 05/02/2020   Weakness generalized     . Past Surgical History:  Procedure Laterality Date   CARDIAC CATHETERIZATION  2006   CORONARY ANGIOPLASTY WITH STENT PLACEMENT  2007   IR FLUORO GUIDE CV LINE RIGHT  05/20/2016   IR US GUIDE VASC ACCESS RIGHT  05/20/2016    . Social History   Tobacco Use   Smoking status: Never   Smokeless tobacco: Never  Vaping Use   Vaping Use: Never used  Substance Use Topics   Alcohol use: No   Drug use: No    ALLERGIES:  has No Known Allergies.  MEDICATIONS:  Current Outpatient Medications  Medication Sig Dispense Refill   amLODipine (NORVASC) 10 MG tablet Take 10 mg by mouth daily.     amoxicillin-clavulanate (AUGMENTIN) 875-125 MG tablet Take 1 tablet by mouth 2 (two) times daily.     aspirin 81 MG tablet Take 81 mg by mouth 2 (two) times daily.     atorvastatin (LIPITOR) 20 MG tablet Take 1 tablet (20 mg total) by mouth daily. 90 tablet 3   Cholecalciferol 10 MCG (400 UNIT) CAPS Take 1 capsule by mouth daily.     gabapentin (NEURONTIN) 300 MG capsule Take 300 mg by mouth daily.     LANTUS SOLOSTAR 100 UNIT/ML Solostar Pen Inject 16 Units into the skin daily.     lenalidomide (REVLIMID) 10 MG capsule TAKE 1 CAPSULE BY MOUTH EVERY DAY FOR 21 DAYS THEN 7 DAYS OFF 21 capsule 0   lisinopril (ZESTRIL) 2.5 MG tablet Take 2.5 mg by mouth daily.     metoprolol succinate (TOPROL-XL) 100 MG 24 hr tablet Take 100 mg by mouth daily.      nitroGLYCERIN (NITROSTAT) 0.4 MG SL tablet Place 1 tablet (0.4 mg total) under the tongue every 5 (five) minutes x 3 doses as needed for chest pain. 30 tablet 12   vitamin B-12 (CYANOCOBALAMIN) 1000 MCG tablet Take 1,000 mcg by mouth daily.     No current facility-administered medications for this visit.    PHYSICAL EXAMINATION: Telemedicine visit  LABORATORY DATA:   I have reviewed  the data as listed     Latest Ref Rng & Units 08/05/2021   11:00 AM 07/17/2021    9:15 PM 04/16/2021   12:45 PM  CBC  WBC 4.0 - 10.5 K/uL 2.9  4.0  3.9   Hemoglobin 13.0 - 17.0 g/dL 11.2  11.3  11.4   Hematocrit 39.0 - 52.0 % 34.2  34.8  35.6  Platelets 150 - 400 K/uL 160  195  179   ANC 1k  .    Latest Ref Rng & Units 08/05/2021   11:00 AM 07/17/2021    9:15 PM 04/16/2021   12:45 PM  CMP  Glucose 70 - 99 mg/dL 194  98  149   BUN 8 - 23 mg/dL 10  15  26    Creatinine 0.61 - 1.24 mg/dL 1.38  1.48  1.48   Sodium 135 - 145 mmol/L 139  140  139   Potassium 3.5 - 5.1 mmol/L 3.7  3.4  3.6   Chloride 98 - 111 mmol/L 105  108  106   CO2 22 - 32 mmol/L 29  25  27    Calcium 8.9 - 10.3 mg/dL 8.5  8.8  8.4   Total Protein 6.5 - 8.1 g/dL 7.5  7.7  6.8   Total Bilirubin 0.3 - 1.2 mg/dL 0.3  0.8  0.4   Alkaline Phos 38 - 126 U/L 73  84  92   AST 15 - 41 U/L 15  23  16    ALT 0 - 44 U/L 17  23  19         RADIOGRAPHIC STUDIES: I have personally reviewed the radiological images as listed and agreed with the findings in the report. No results found.  ASSESSMENT & PLAN:   76 y.o.  with multiple medical comorbidities but fairly good performance status overall with   1) ISS Stage II IgG [place] multiple myeloma. Anemia + Renal insufficiency.  Bone survey neg for concerning bone lesions. significantly elevated kappa free light chains 858, lambda 18.4, with a ratio of 46.65 . He was also noted to have an M spike of 1.7 g/dL with IFE showing IgG kappa monoclonal protein. Quantitative IgG level was increased to nearly 2500 mg/dL. 24-hour UPEP showed monoclonal protein of 1300 mg per 24 hours which constituted about 86% of all the urinary protein. Bone marrow biopsy showed 20-30% Restricted plasma cells consistent with multiple myeloma Cytogenetics/FISH  Showed +4, +11, and +17   Patient has been treated with CyBord X 2 cycles . Treatment interrupted due to severe c diff colitis with prolonged  hospitalization and decline in performance status.  Patient is much improved and back to baseline now. M protein undetected with neg IFE Has been treated with 5 cycles of Rd  Currently on maintenance Revlikmid and in Remission.  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 Had an element of obstructive uropathy due to BPH which has resolved. Creatinine continues to be stable.   3) Normocytic anemia likely related to multiple myeloma.-stable. No abdominal pain or discomfort over the right upper quadrant. No fevers or chills.  PLAN:  Patient notes no new clinical symptoms suggestive of myeloma progression at this time. His labs from 08/05/2021 were reviewed in detail and show stable kappa lambda ratio. CBC shows some mild variable neutropenia related to Revlimid. CMP is stable chronic kidney disease creatinine 1.38 and no hypercalcemia. No obvious lab evidence of myeloma progression at this time. Patient's muscle cramps have been stable. He reports dry scaly skin and some scaly mildly pruritic area over the left leg over the last 1 to 2 months.  We discussed dermatology referral but the patient declined this currently. We discussed using only short lukewarm showers loose cotton clothing and avoiding any medicated soaps or other cosmetics. He is going to pat dry after the shower and apply lotion for his dry  skin liberally. He would also try to use topical hydrocortisone to the itchy area over his left leg. He was recommended to hold Revlimid if there is any persistence or worsening of his rash despite the above interventions. We will see him back in clinic sooner in about 2 months to reevaluate his clinical symptoms.  He was recommended to call us if these persist or worsen in the interim. -Patient reports no other significant toxicities related to his maintenance Revlimid and will continue this currently at 10 mg 3 weeks on 1 week off. -Continue  aspirin 81 mg for VTE prophylaxis -Continue current dose of vitamin D  4) DM2 - controlled -continue f/u with PCP.   FOLLOW UP: Return to clinic with [name] in 2 months with labs 1 week prior to clinic visit  The total time spent in the appointment was 20 minutes*.  All of the patient's questions were answered with apparent satisfaction. The patient knows to call the clinic with any problems, questions or concerns.   Douglas Lone MD MS AAHIVMS [facility] Hematology/Oncology Physician [facility]  .*Total Encounter Time as defined by the Centers for Medicare and Medicaid Services includes, in addition to the face-to-face time of a patient visit (documented in the note above) non-face-to-face time: obtaining and reviewing outside history, ordering and reviewing medications, tests or procedures, care coordination (communications with other health care professionals or caregivers) and documentation in the medical record.

## 2021-08-21 ENCOUNTER — Telehealth: Payer: Self-pay | Admitting: Hematology

## 2021-08-21 NOTE — Telephone Encounter (Signed)
Scheduled follow-up appointments per 8/14 los. Patient is aware.

## 2021-08-31 ENCOUNTER — Emergency Department (HOSPITAL_COMMUNITY): Payer: Medicare HMO

## 2021-08-31 ENCOUNTER — Observation Stay (HOSPITAL_COMMUNITY)
Admission: EM | Admit: 2021-08-31 | Discharge: 2021-09-02 | Disposition: A | Discharge status: Home / Self Care | Payer: Medicare HMO | Attending: Internal Medicine | Admitting: Internal Medicine

## 2021-08-31 ENCOUNTER — Other Ambulatory Visit: Payer: Self-pay

## 2021-08-31 ENCOUNTER — Other Ambulatory Visit: Payer: Self-pay | Admitting: Hematology

## 2021-08-31 ENCOUNTER — Encounter (HOSPITAL_COMMUNITY): Payer: Self-pay | Admitting: *Deleted

## 2021-08-31 DIAGNOSIS — D696 Thrombocytopenia, unspecified: Secondary | ICD-10-CM | POA: Diagnosis not present

## 2021-08-31 DIAGNOSIS — R0789 Other chest pain: Secondary | ICD-10-CM | POA: Diagnosis present

## 2021-08-31 DIAGNOSIS — E119 Type 2 diabetes mellitus without complications: Secondary | ICD-10-CM

## 2021-08-31 DIAGNOSIS — R509 Fever, unspecified: Secondary | ICD-10-CM | POA: Diagnosis not present

## 2021-08-31 DIAGNOSIS — I2510 Atherosclerotic heart disease of native coronary artery without angina pectoris: Secondary | ICD-10-CM | POA: Diagnosis not present

## 2021-08-31 DIAGNOSIS — N183 Chronic kidney disease, stage 3 unspecified: Secondary | ICD-10-CM | POA: Diagnosis present

## 2021-08-31 DIAGNOSIS — I2609 Other pulmonary embolism with acute cor pulmonale: Secondary | ICD-10-CM | POA: Diagnosis not present

## 2021-08-31 DIAGNOSIS — M6281 Muscle weakness (generalized): Secondary | ICD-10-CM | POA: Insufficient documentation

## 2021-08-31 DIAGNOSIS — R262 Difficulty in walking, not elsewhere classified: Secondary | ICD-10-CM | POA: Insufficient documentation

## 2021-08-31 DIAGNOSIS — C9001 Multiple myeloma in remission: Secondary | ICD-10-CM | POA: Diagnosis present

## 2021-08-31 DIAGNOSIS — N1831 Chronic kidney disease, stage 3a: Secondary | ICD-10-CM | POA: Diagnosis not present

## 2021-08-31 DIAGNOSIS — R0602 Shortness of breath: Secondary | ICD-10-CM | POA: Diagnosis not present

## 2021-08-31 DIAGNOSIS — E1122 Type 2 diabetes mellitus with diabetic chronic kidney disease: Secondary | ICD-10-CM | POA: Diagnosis not present

## 2021-08-31 DIAGNOSIS — R2681 Unsteadiness on feet: Secondary | ICD-10-CM | POA: Diagnosis not present

## 2021-08-31 DIAGNOSIS — I10 Essential (primary) hypertension: Secondary | ICD-10-CM | POA: Diagnosis present

## 2021-08-31 DIAGNOSIS — Z7982 Long term (current) use of aspirin: Secondary | ICD-10-CM | POA: Insufficient documentation

## 2021-08-31 DIAGNOSIS — Z79899 Other long term (current) drug therapy: Secondary | ICD-10-CM | POA: Insufficient documentation

## 2021-08-31 DIAGNOSIS — I129 Hypertensive chronic kidney disease with stage 1 through stage 4 chronic kidney disease, or unspecified chronic kidney disease: Secondary | ICD-10-CM | POA: Insufficient documentation

## 2021-08-31 DIAGNOSIS — Z794 Long term (current) use of insulin: Secondary | ICD-10-CM | POA: Insufficient documentation

## 2021-08-31 DIAGNOSIS — N179 Acute kidney failure, unspecified: Secondary | ICD-10-CM | POA: Insufficient documentation

## 2021-08-31 DIAGNOSIS — Z85038 Personal history of other malignant neoplasm of large intestine: Secondary | ICD-10-CM | POA: Insufficient documentation

## 2021-08-31 DIAGNOSIS — Z955 Presence of coronary angioplasty implant and graft: Secondary | ICD-10-CM | POA: Diagnosis not present

## 2021-08-31 DIAGNOSIS — I2699 Other pulmonary embolism without acute cor pulmonale: Secondary | ICD-10-CM | POA: Diagnosis present

## 2021-08-31 LAB — BASIC METABOLIC PANEL
Anion gap: 10 (ref 5–15)
BUN: 18 mg/dL (ref 8–23)
CO2: 22 mmol/L (ref 22–32)
Calcium: 8.7 mg/dL — ABNORMAL LOW (ref 8.9–10.3)
Chloride: 104 mmol/L (ref 98–111)
Creatinine, Ser: 1.53 mg/dL — ABNORMAL HIGH (ref 0.61–1.24)
GFR, Estimated: 47 mL/min — ABNORMAL LOW (ref >60)
Glucose, Bld: 120 mg/dL — ABNORMAL HIGH (ref 70–99)
Potassium: 3.7 mmol/L (ref 3.5–5.1)
Sodium: 136 mmol/L (ref 135–145)

## 2021-08-31 LAB — CBC
HCT: 36.6 % — ABNORMAL LOW (ref 39.0–52.0)
Hemoglobin: 11.7 g/dL — ABNORMAL LOW (ref 13.0–17.0)
MCH: 28.5 pg (ref 26.0–34.0)
MCHC: 32 g/dL (ref 30.0–36.0)
MCV: 89.1 fL (ref 80.0–100.0)
Platelets: 109 10*3/uL — ABNORMAL LOW (ref 150–400)
RBC: 4.11 MIL/uL — ABNORMAL LOW (ref 4.22–5.81)
RDW: 19.1 % — ABNORMAL HIGH (ref 11.5–15.5)
WBC: 5.6 10*3/uL (ref 4.0–10.5)
nRBC: 0 % (ref 0.0–0.2)

## 2021-08-31 LAB — TROPONIN I (HIGH SENSITIVITY): Troponin I (High Sensitivity): 34 ng/L — ABNORMAL HIGH (ref <18)

## 2021-08-31 NOTE — ED Triage Notes (Signed)
Pt arrives via [facility] from home. Per report, The patient has been having right sided chest pain and generalized malaise since yesterday, En route vitals 170/60, hr 70, 98% ra, rr 20, cbg 114, IV established in his left [facility]

## 2021-08-31 NOTE — ED Provider Triage Note (Signed)
Emergency Medicine Provider Triage Evaluation Note  Douglas Rose. , a 76 y.o. male  was evaluated in triage.  Pt complains of right-sided pain, started after he was moving his vacuum, pain is mainly on the lateral aspect of his right side, can feel on the right side of his neck right side of his chest, pain is worse with movement improved with rest, states when he breathes he does have some slight pain, never had this past, has no other complaints..  Review of Systems  Positive: Neck and right side pain Negative: Back pain, chest pain  Physical Exam  BP (!) 158/87   Pulse 77   Temp (!) 100.5 F (38.1 C) (Oral)   Resp 18   SpO2 98%  Gen:   Awake, no distress   Resp:  Normal effort  MSK:   Moves extremities without difficulty  Other:    Medical Decision Making  Medically screening exam initiated at 10:28 PM.  Appropriate orders placed.  Douglas Rose. was informed that the remainder of the evaluation will be completed by another provider, this initial triage assessment does not replace that evaluation, and the importance of remaining in the ED until their evaluation is complete.  Lab work imaging been ordered will need further work-up.   Marcello Fennel, PA-C 08/31/21 2229

## 2021-08-31 NOTE — ED Triage Notes (Signed)
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 Pain is worse to deep breath. Denies fever or cough.

## 2021-09-01 ENCOUNTER — Inpatient Hospital Stay (HOSPITAL_BASED_OUTPATIENT_CLINIC_OR_DEPARTMENT_OTHER): Payer: Medicare HMO

## 2021-09-01 ENCOUNTER — Encounter (HOSPITAL_COMMUNITY): Payer: Self-pay | Admitting: Family Medicine

## 2021-09-01 ENCOUNTER — Emergency Department (HOSPITAL_COMMUNITY): Payer: Medicare HMO

## 2021-09-01 DIAGNOSIS — R509 Fever, unspecified: Secondary | ICD-10-CM

## 2021-09-01 DIAGNOSIS — I2699 Other pulmonary embolism without acute cor pulmonale: Secondary | ICD-10-CM | POA: Diagnosis present

## 2021-09-01 DIAGNOSIS — I10 Essential (primary) hypertension: Secondary | ICD-10-CM

## 2021-09-01 DIAGNOSIS — C9001 Multiple myeloma in remission: Secondary | ICD-10-CM

## 2021-09-01 DIAGNOSIS — D696 Thrombocytopenia, unspecified: Secondary | ICD-10-CM

## 2021-09-01 DIAGNOSIS — I2609 Other pulmonary embolism with acute cor pulmonale: Secondary | ICD-10-CM | POA: Diagnosis not present

## 2021-09-01 DIAGNOSIS — Z794 Long term (current) use of insulin: Secondary | ICD-10-CM

## 2021-09-01 DIAGNOSIS — I82442 Acute embolism and thrombosis of left tibial vein: Secondary | ICD-10-CM

## 2021-09-01 DIAGNOSIS — E1122 Type 2 diabetes mellitus with diabetic chronic kidney disease: Secondary | ICD-10-CM

## 2021-09-01 DIAGNOSIS — N1831 Chronic kidney disease, stage 3a: Secondary | ICD-10-CM

## 2021-09-01 DIAGNOSIS — I70319 Atherosclerosis of unspecified type of bypass graft(s) of the extremities with intermittent claudication, unspecified extremity: Secondary | ICD-10-CM

## 2021-09-01 HISTORY — DX: Fever, unspecified: R50.9

## 2021-09-01 HISTORY — DX: Other pulmonary embolism without acute cor pulmonale: I26.99

## 2021-09-01 HISTORY — DX: Thrombocytopenia, unspecified: D69.6

## 2021-09-01 LAB — CBG MONITORING, ED
Glucose-Capillary: 122 mg/dL — ABNORMAL HIGH (ref 70–99)
Glucose-Capillary: 155 mg/dL — ABNORMAL HIGH (ref 70–99)

## 2021-09-01 LAB — GLUCOSE, CAPILLARY: Glucose-Capillary: 176 mg/dL — ABNORMAL HIGH (ref 70–99)

## 2021-09-01 LAB — HEPATIC FUNCTION PANEL
ALT: 19 U/L (ref 0–44)
AST: 18 U/L (ref 15–41)
Albumin: 3.2 g/dL — ABNORMAL LOW (ref 3.5–5.0)
Alkaline Phosphatase: 78 U/L (ref 38–126)
Bilirubin, Direct: 0.2 mg/dL (ref 0.0–0.2)
Indirect Bilirubin: 0.8 mg/dL (ref 0.3–0.9)
Total Bilirubin: 1 mg/dL (ref 0.3–1.2)
Total Protein: 7.5 g/dL (ref 6.5–8.1)

## 2021-09-01 LAB — ECHOCARDIOGRAM COMPLETE
Area-P 1/2: 3.77 cm2
S' Lateral: 2.8 cm
Single Plane A2C EF: 52.8 %

## 2021-09-01 LAB — HEPARIN LEVEL (UNFRACTIONATED)
Heparin Unfractionated: 0.76 IU/mL — ABNORMAL HIGH (ref 0.30–0.70)
Heparin Unfractionated: 0.41 IU/mL (ref 0.30–0.70)
Heparin Unfractionated: 0.33 IU/mL (ref 0.30–0.70)

## 2021-09-01 LAB — PROTIME-INR
INR: 1.2 (ref 0.8–1.2)
Prothrombin Time: 14.6 seconds (ref 11.4–15.2)

## 2021-09-01 LAB — LACTIC ACID, PLASMA: Lactic Acid, Venous: 1.5 mmol/L (ref 0.5–1.9)

## 2021-09-01 LAB — TROPONIN I (HIGH SENSITIVITY): Troponin I (High Sensitivity): 29 ng/L — ABNORMAL HIGH (ref <18)

## 2021-09-01 LAB — BRAIN NATRIURETIC PEPTIDE: B Natriuretic Peptide: 167.7 pg/mL — ABNORMAL HIGH (ref 0.0–100.0)

## 2021-09-01 LAB — APTT: aPTT: 24 seconds (ref 24–36)

## 2021-09-01 MED ORDER — AMLODIPINE BESYLATE 10 MG PO TABS
10.0000 mg | ORAL_TABLET | Freq: Every day | ORAL | Status: DC
  Administered 2021-09-01 – 2021-09-02 (×2): 10 mg via ORAL
  Filled 2021-09-01: qty 1
  Filled 2021-09-01: qty 2

## 2021-09-01 MED ORDER — GABAPENTIN 300 MG PO CAPS
300.0000 mg | ORAL_CAPSULE | Freq: Every day | ORAL | Status: DC
  Administered 2021-09-01 – 2021-09-02 (×2): 300 mg via ORAL
  Filled 2021-09-01 (×2): qty 1

## 2021-09-01 MED ORDER — INSULIN ASPART 100 UNIT/ML IJ SOLN
0.0000 [IU] | Freq: Three times a day (TID) | INTRAMUSCULAR | Status: DC
  Administered 2021-09-01: 2 [IU] via SUBCUTANEOUS
  Administered 2021-09-01: 1 [IU] via SUBCUTANEOUS
  Administered 2021-09-02: 3 [IU] via SUBCUTANEOUS
  Administered 2021-09-02: 2 [IU] via SUBCUTANEOUS

## 2021-09-01 MED ORDER — LISINOPRIL 5 MG PO TABS
2.5000 mg | ORAL_TABLET | Freq: Every day | ORAL | Status: DC
  Administered 2021-09-01 – 2021-09-02 (×2): 2.5 mg via ORAL
  Filled 2021-09-01 (×2): qty 1

## 2021-09-01 MED ORDER — HEPARIN BOLUS VIA INFUSION
4000.0000 [IU] | Freq: Once | INTRAVENOUS | Status: AC
  Administered 2021-09-01: 4000 [IU] via INTRAVENOUS
  Filled 2021-09-01: qty 4000

## 2021-09-01 MED ORDER — OXYCODONE HCL 5 MG PO TABS
5.0000 mg | ORAL_TABLET | ORAL | Status: DC | PRN
  Administered 2021-09-01 (×3): 5 mg via ORAL
  Filled 2021-09-01 (×3): qty 1

## 2021-09-01 MED ORDER — HEPARIN (PORCINE) 25000 UT/250ML-% IV SOLN
1500.0000 [IU]/h | INTRAVENOUS | Status: DC
  Administered 2021-09-01 (×2): 1300 [IU]/h via INTRAVENOUS
  Filled 2021-09-01 (×2): qty 250

## 2021-09-01 MED ORDER — ONDANSETRON HCL 4 MG PO TABS: 4.0000 mg | ORAL_TABLET | Freq: Four times a day (QID) | ORAL | Status: DC | PRN

## 2021-09-01 MED ORDER — FENTANYL CITRATE PF 50 MCG/ML IJ SOSY: 25.0000 ug | PREFILLED_SYRINGE | INTRAMUSCULAR | Status: DC | PRN

## 2021-09-01 MED ORDER — ONDANSETRON HCL 4 MG/2ML IJ SOLN: 4.0000 mg | Freq: Four times a day (QID) | INTRAMUSCULAR | Status: DC | PRN

## 2021-09-01 MED ORDER — ACETAMINOPHEN 325 MG PO TABS: 650.0000 mg | ORAL_TABLET | Freq: Four times a day (QID) | ORAL | Status: DC | PRN

## 2021-09-01 MED ORDER — CARBAMIDE PEROXIDE 6.5 % OT SOLN
5.0000 [drp] | Freq: Two times a day (BID) | OTIC | Status: DC
  Administered 2021-09-01 – 2021-09-02 (×2): 5 [drp] via OTIC
  Filled 2021-09-01 (×2): qty 15

## 2021-09-01 MED ORDER — ACETAMINOPHEN 650 MG RE SUPP: 650.0000 mg | Freq: Four times a day (QID) | RECTAL | Status: DC | PRN

## 2021-09-01 MED ORDER — SODIUM CHLORIDE 0.9% FLUSH
3.0000 mL | Freq: Two times a day (BID) | INTRAVENOUS | Status: DC
  Administered 2021-09-01 – 2021-09-02 (×2): 3 mL via INTRAVENOUS

## 2021-09-01 MED ORDER — IOHEXOL 350 MG/ML SOLN
80.0000 mL | Freq: Once | INTRAVENOUS | Status: AC | PRN
  Administered 2021-09-01: 80 mL via INTRAVENOUS

## 2021-09-01 MED ORDER — INSULIN GLARGINE-YFGN 100 UNIT/ML ~~LOC~~ SOLN
10.0000 [IU] | Freq: Every day | SUBCUTANEOUS | Status: DC
  Administered 2021-09-01 – 2021-09-02 (×2): 10 [IU] via SUBCUTANEOUS
  Filled 2021-09-01 (×2): qty 0.1

## 2021-09-01 MED ORDER — ASPIRIN 81 MG PO TBEC
81.0000 mg | DELAYED_RELEASE_TABLET | Freq: Every day | ORAL | Status: DC
  Administered 2021-09-01 – 2021-09-02 (×2): 81 mg via ORAL
  Filled 2021-09-01 (×2): qty 1

## 2021-09-01 MED ORDER — ATORVASTATIN CALCIUM 10 MG PO TABS
20.0000 mg | ORAL_TABLET | Freq: Every day | ORAL | Status: DC
  Administered 2021-09-01 – 2021-09-02 (×2): 20 mg via ORAL
  Filled 2021-09-01 (×2): qty 2

## 2021-09-01 MED ORDER — SENNOSIDES-DOCUSATE SODIUM 8.6-50 MG PO TABS
1.0000 | ORAL_TABLET | Freq: Every evening | ORAL | Status: DC | PRN
  Filled 2021-09-01: qty 1

## 2021-09-01 MED ORDER — INSULIN ASPART 100 UNIT/ML IJ SOLN: 0.0000 [IU] | Freq: Every day | INTRAMUSCULAR | Status: DC

## 2021-09-01 NOTE — ED Notes (Signed)
Pt states the chest pain happened yesterday and currently he is having no chest pain or discomfort

## 2021-09-01 NOTE — Progress Notes (Signed)
  Echocardiogram 2D Echocardiogram has been performed.  Johny Chess 09/01/2021, 10:04 AM

## 2021-09-01 NOTE — Consult Note (Signed)
[facility] [place]  Patient name: Douglas Rose. MRN: [id number] DOB: Jun 22, 1945 Sex: male   REQUESTING PROVIDER:   Hospital service   REASON FOR CONSULT:    Claudication  HISTORY OF PRESENT ILLNESS:   Douglas Rose. is a 76 y.o. male, who presented to the emergency department today with chest pain.  He began having right lower chest pain that is pleuritic in nature, yesterday.  He underwent a CT angiogram that was positive for pulmonary emboli in the right middle and lower lobes.  Ultrasound revealed thrombus within the left posterior tibial vein.  He incidentally was found to have a left superficial femoral occlusion.  He states that he does get leg weakness with walking but he can walk a fairly long distance.  He does not have any open wounds.  The patient has a history of coronary artery disease, status post PCI.  He has a diabetic.  He takes a statin for hypercholesterolemia.  He is a non-smoker  PAST MEDICAL HISTORY    Past Medical History:  Diagnosis Date   Abdominal fullness 02/26/2016   Last Assessment & Plan:  overall improving with no pain, no further nausea and normal BM today.  No signs of obstruction or infection today.  Due for 10 year colonoscopy, mild anemia in December, will refer to GI   Acute renal failure ([place]) 03/06/2016   Acute renal failure superimposed on chronic kidney disease ([place]) 03/01/2016   AKI (acute kidney injury) ([place])    Arterial insufficiency of lower extremity ([place]) 09/01/2016   Overview:  Mild (2018)  Last Assessment & Plan:  Mild arterial insufficiency, he does not have classic symptoms of claudication. Advised daily foot checks and would consider further evaluation if new or worsening symptoms  Mild (2018)  Last Assessment & Plan:  asymptomatic.  We will follow-up as needed Formatting of this note might be different from the original. Mild (2018)  Last Assessment & Pla    Atherosclerotic heart disease of native coronary artery without angina pectoris 05/02/2020   Benign hypertension 07/21/2011   Last Assessment & Plan:  Formatting of this note might be different from the original.  We discussed blood pressure above goal today. Previously well controlled. I recommended he continue current medications, focus on lifestyle modifications including low-sodium diet and will reassess in 3 months   Benign prostatic hyperplasia 05/02/2020   CAD (coronary artery disease) 2006   stent LAD 3.0x18 [place], occluded RCA   Cancer ([place])    multiple myeloma   Cervical lymphadenopathy 03/31/2016   Colitis 05/12/2016   Coronary artery disease 07/21/2011   stent LAD 3.0x18 [place], occluded RCA  Overview:  Stent 2006 at [facility], Low risk stress test feb 2017  Last Assessment & Plan:  Declined further stress testing in 2015 due to cost and absence of symptoms.  Continues to be asymptomatic.  He has upcoming follow-up with cardiology for review. 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  Patient is up-to-date on Covid booster   Infected tooth 03/31/2016   Leukocytosis    Lipoma 07/19/2014   Overview:  Right scalp.  Seen by Payton Mccallum in past  Last Assessment & Plan:  Lipoma on right scalp getting larger, will refer to ENT, but more concerned about lymph node   Lymphadenopathy 11/21/2015   Last Assessment & Plan:  Right  submandibular lymph node, non-tender, now larger. CBC 2 months ago showed mild anemia, normal peripheral smear.  Will recheck today along with ESR, sed rate.  Will refer to ENT for further evaluation.    Last Assessment & Plan:   Soft, right-sided postauricular mass, deferred intervention in 2018 at onset of myeloma diagnosis, gradually enlarging over time.  Had cons   Monoclonal gammopathy    Multiple myeloma ([place]) 03/20/2016   Last Assessment & Plan:  Given dx, rec social distancing, avoiding in person jury duty.  Letter provided Formatting of this note might be different from the original. Last Assessment & Plan:  Myeloma treatment is going well, he has upcoming oncology visit   Multiple myeloma in remission ([place]) 07/22/2016   Last Assessment & Plan:  Formatting of this note might be different from the original.  Patient remains in remission, followed by oncology.  Had labs drawn today   Palliative care by specialist    Pancreatitis 03/02/2016   Peripheral neuropathy 07/22/2016   Last Assessment & Plan:  Overall improved. Patient has discontinued gabapentin, did not feel much difference  Feet and fingers.  Multifactorial, DM, Chemo, MM  Last Assessment & Plan:   Doing well on gabapentin Formatting of this note might be different from the original. Last Assessment & Plan:  Overall improved. Patient has discontinued gabapentin, did not feel much difference   Physical deconditioning 06/13/2016   Last Assessment & Plan:  Marked weakness.  Will request further home assistance per home health with bathing.  Discharged on a supplement- patient reports is unaffordable.  Advised prefer food as no barriers, appetite reasonable, ok to use glucerna or carnation instant breakfast if desired.   Pre-operative clearance 01/30/2017   Protein-calorie malnutrition, severe 03/03/2016   Right hip pain 09/27/2019   Last Assessment & Plan:  Formatting of this note might be different from the original.  Recent tests reassuring for  no malignancy.  known hip arthritis.  Symptoms today separate from neuropathy.  He plans on starting program with trainer at [facility], declined PT today for gait and strength.  Will reassess at followup.   Screening for malignant neoplasm of colon 05/02/2020   Sinus tachycardia 05/13/2016   SIRS (systemic inflammatory response syndrome) ([place]) 05/12/2016   Stage 3 chronic kidney disease ([place]) 06/13/2016   Last Assessment & Plan:  Will check Cr/K today  June 2020, annual follow-up [place] [facility]  Last Assessment & Plan:   Recent labs reviewed- stable Formatting of this note might be different from the original. Last Assessment & Plan:  Will check Cr/K today   Type 2 diabetes mellitus ([place]) 07/21/2011   Last Assessment & Plan:   Diabetes improved with fasting glucoses in a safe range.  Continue current regimen. Formatting of this note might be different from the original. Last Assessment & Plan:  Patient's diabetes has been well controlled at baseline on 12 units of Lantus daily.  He reports some hyperglycemia on the first few days after dexamethasone with resolution by the end  of the week. We'll   Upper abdominal pain    Urinary retention    Vitamin D deficiency 05/02/2020   Weakness generalized      FAMILY HISTORY   Family History  Problem Relation Age of Onset   Arrhythmia Mother    Heart disease Mother    Diabetes Sister    Diabetes Brother    Cancer Brother    Stroke Father    Diabetes Brother     SOCIAL HISTORY:   Social History   Socioeconomic History   Marital status: Married    Spouse name: Not on file   Number of children: 6   Years of education: Not on file   Highest education level: Not on file  Occupational History   Occupation: retired Company secretary  Tobacco Use   Smoking status: Never   Smokeless tobacco: Never  Vaping Use   Vaping Use: Never used  Substance and Sexual Activity   Alcohol use: No   Drug use: No   Sexual activity: Not Currently  Other Topics Concern   Not  on file  Social History Narrative   Not on file   Social Determinants of Health   Financial Resource Strain: Not on file  Food Insecurity: Not on file  Transportation Needs: Not on file  Physical Activity: Not on file  Stress: Not on file  Social Connections: Not on file  Intimate Partner Violence: Not on file    ALLERGIES:    No Known Allergies  CURRENT MEDICATIONS:    Current Facility-Administered Medications  Medication Dose Route Frequency Provider Last Rate Last Admin   acetaminophen (TYLENOL) tablet 650 mg  650 mg Oral Q6H PRN Opyd, Ilene Qua, MD       Or   acetaminophen (TYLENOL) suppository 650 mg  650 mg Rectal Q6H PRN Opyd, Ilene Qua, MD       amLODipine (NORVASC) tablet 10 mg  10 mg Oral Daily Opyd, Ilene Qua, MD   10 mg at 09/01/21 1034   atorvastatin (LIPITOR) tablet 20 mg  20 mg Oral Daily Opyd, Ilene Qua, MD   20 mg at 09/01/21 1034   carbamide peroxide (DEBROX) 6.5 % OTIC (EAR) solution 5 drop  5 drop Left EAR BID Oswald Hillock, MD   5 drop at 09/01/21 1356   fentaNYL (SUBLIMAZE) injection 25 mcg  25 mcg Intravenous Q2H PRN Opyd, Ilene Qua, MD       gabapentin (NEURONTIN) capsule 300 mg  300 mg Oral Daily Opyd, Ilene Qua, MD   300 mg at 09/01/21 1034   heparin ADULT infusion 100 units/mL (25000 units/24m)  1,350 Units/hr Intravenous Continuous BEinar Grad RPH 13 mL/hr at 09/01/21 1800 1,300 Units/hr at 09/01/21 1800   insulin aspart (novoLOG) injection 0-5 Units  0-5 Units Subcutaneous QHS Opyd, Timothy S, MD       insulin aspart (novoLOG) injection 0-9 Units  0-9 Units Subcutaneous TID WC Opyd, TIlene Qua MD   2 Units at 09/01/21 1204   insulin glargine-yfgn (SEMGLEE) injection 10 Units  10 Units Subcutaneous Daily Opyd, TIlene Qua MD   10 Units at 09/01/21 1035   lisinopril (ZESTRIL) tablet 2.5 mg  2.5 mg Oral Daily Opyd, TIlene Qua MD   2.5 mg at 09/01/21 1034   ondansetron (ZOFRAN) tablet 4 mg  4 mg Oral Q6H PRN Opyd, TIlene Qua MD       Or    ondansetron (ZOFRAN) injection 4 mg  4 mg Intravenous Q6H PRN Opyd, TChristia Reading  S, MD       oxyCODONE (Oxy IR/ROXICODONE) immediate release tablet 5 mg  5 mg Oral Q4H PRN Opyd, Ilene Qua, MD   5 mg at 09/01/21 1823   senna-docusate (Senokot-S) tablet 1 tablet  1 tablet Oral QHS PRN Opyd, Ilene Qua, MD       sodium chloride flush (NS) 0.9 % injection 3 mL  3 mL Intravenous Q12H Opyd, Ilene Qua, MD        REVIEW OF SYSTEMS:   [X] denotes positive finding, [ ] denotes negative finding Cardiac  Comments:  Chest pain or chest pressure: x   Shortness of breath upon exertion:    Short of breath when lying flat:    Irregular heart rhythm:        Vascular    Pain in calf, thigh, or hip brought on by ambulation:    Pain in feet at night that wakes you up from your sleep:     Blood clot in your veins:    Leg swelling:         Pulmonary    Oxygen at home:    Productive cough:     Wheezing:         Neurologic    Sudden weakness in arms or legs:     Sudden numbness in arms or legs:     Sudden onset of difficulty speaking or slurred speech:    Temporary loss of vision in one eye:     Problems with dizziness:         Gastrointestinal    Blood in stool:      Vomited blood:         Genitourinary    Burning when urinating:     Blood in urine:        Psychiatric    Major depression:         Hematologic    Bleeding problems:    Problems with blood clotting too easily:        Skin    Rashes or ulcers:        Constitutional    Fever or chills:     PHYSICAL EXAM:   Vitals:   09/01/21 1500 09/01/21 1547 09/01/21 1731 09/01/21 1936  BP: (!) 149/67  (!) 153/75 (!) 133/57  Pulse: 68  78 74  Resp: _0 Temp:  98.7 F (37.1 C) 99 F (37.2 C) 99.2 F (37.3 C)  TempSrc:  Oral Oral Oral  SpO2: 96%  95% 99%  Weight:      Height:        GENERAL: The patient is a well-nourished male, in no acute distress. The vital signs are documented above. CARDIAC: There is a regular rate  and rhythm.  VASCULAR: Nonpalpable pedal pulses but bilateral edema PULMONARY: Nonlabored respirations ABDOMEN: Soft and non-tender with normal pitched bowel sounds.  MUSCULOSKELETAL: There are no major deformities or cyanosis. NEUROLOGIC: No focal weakness or paresthesias are detected. SKIN: There are no ulcers or rashes noted. PSYCHIATRIC: The patient has a normal affect.  STUDIES:   I have reviewed his ultrasound that shows a left posterior tibial DVT as well as superficial femoral artery claudication  ASSESSMENT and PLAN   Left posterior tibial DVT and PE: No vascular intervention is necessary for the location of his clot.  He should be adequately treated with IV heparin and conversion to a DOAC.  He should be fitted for compression stockings to help with his edema.  Claudication: The patient does report some claudication symptoms however this does not happen at short distances.  I do not feel that this is lifestyle limiting.  I discussed with the patient, his wife and daughter that I would recommend medical management which would include continuing his statin, controlling his diabetes, starting an exercise program, and adding aspirin to his medical regimen.  As long as his symptoms remain stable and he he does not have any nonhealing wounds, I will plan on seeing him in the office in 3 to 6 months for follow-up.  I would not actively follow the patient.  Please contact me with additional questions.   Leia Alf, MD, FACS [facility] [phone] Pager [phone]

## 2021-09-01 NOTE — ED Notes (Signed)
Pt being transported to CT, then tech is aware to bring him to treatment room

## 2021-09-01 NOTE — H&P (Signed)
History and Physical    Douglas Rose. XTK:[id number] DOB: [birth year] DOA: 08/31/2021  PCP: [name], MD   Patient coming from: Home   Chief Complaint: Chest pain   HPI: Douglas Ragsdale. is a pleasant 76 y.o. male with medical history significant for multiple myeloma in remission, CKD 3A, insulin-dependent diabetes mellitus, hypertension, and CAD, who presented to the emergency department with acute onset of right-sided pleuritic chest pain.    The patient reports he was in his usual state of health until yesterday when he was doing some light work around the house and developed cute onset of pain in his right lower chest.  Pain is worse with deep breath or cough.  He denies sputum production or hemoptysis.  He has had some cramping pain in his right leg but has not noticed any change in his chronic mild bilateral lower extremity swelling.  Denies any recent prolonged immobilization.  Denies chills, dysuria, abdominal pain, vomiting, or diarrhea.  ED Course: Upon arrival to the ED, patient is found to be febrile to 38.1 C with normal respiratory rate, normal heart rate, and stable blood pressure.  CTA chest/abdomen/pelvis is notable for pulmonary embolism involving the right middle and right lower lobes with small overall clot burden and RV/LV ratio of 1.13.  Blood work notable for platelets 109,000, mild elevation in troponin, and creatinine 1.53.  He was started on IV heparin in the ED.  Review of Systems:  All other systems reviewed and apart from HPI, are negative.  Past Medical History:  Diagnosis Date   Abdominal fullness 02/26/2016   Last Assessment & Plan:  overall improving with no pain, no further nausea and normal BM today.  No signs of obstruction or infection today.  Due for 10 year colonoscopy, mild anemia in December, will refer to GI   Acute renal failure ([place]) 03/06/2016   Acute renal failure superimposed on chronic kidney disease ([place]) 03/01/2016   AKI (acute  kidney injury) ([place])    Arterial insufficiency of lower extremity ([place]) 09/01/2016   Overview:  Mild (2018)  Last Assessment & Plan:  Mild arterial insufficiency, he does not have classic symptoms of claudication. Advised daily foot checks and would consider further evaluation if new or worsening symptoms  Mild (2018)  Last Assessment & Plan:  asymptomatic.  We will follow-up as needed Formatting of this note might be different from the original. Mild (2018)  Last Assessment & Pla   Atherosclerotic heart disease of native coronary artery without angina pectoris 05/02/2020   Benign hypertension 07/21/2011   Last Assessment & Plan:  Formatting of this note might be different from the original.  We discussed blood pressure above goal today. Previously well controlled. I recommended he continue current medications, focus on lifestyle modifications including low-sodium diet and will reassess in 3 months   Benign prostatic hyperplasia 05/02/2020   CAD (coronary artery disease) 2006   stent LAD 3.0x18 [place], occluded RCA   Cancer ([place])    multiple myeloma   Cervical lymphadenopathy 03/31/2016   Colitis 05/12/2016   Coronary artery disease 07/21/2011   stent LAD 3.0x18 [place], occluded RCA  Overview:  Stent 2006 at [facility], Low risk stress test feb 2017  Last Assessment & Plan:  Declined further stress testing in 2015 due to cost and absence of symptoms.  Continues to be asymptomatic.  He has upcoming follow-up with cardiology for review. Encouraged to keep appt  Stent 2006 at 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  Patient is up-to-date on Covid booster   Infected tooth 03/31/2016   Leukocytosis    Lipoma 07/19/2014   Overview:  Right scalp.  Seen by Payton Mccallum in past  Last Assessment & Plan:  Lipoma on right scalp getting larger, will refer to ENT, but more concerned about lymph node   Lymphadenopathy 11/21/2015   Last Assessment & Plan:  Right submandibular lymph node, non-tender, now larger. CBC 2 months ago showed mild anemia, normal peripheral smear.  Will recheck today along with ESR, sed rate.  Will refer to ENT for further evaluation.    Last Assessment & Plan:   Soft, right-sided postauricular mass, deferred intervention in 2018 at onset of myeloma diagnosis, gradually enlarging over time.  Had cons   Monoclonal gammopathy    Multiple myeloma ([place]) 03/20/2016   Last Assessment & Plan:  Given dx, rec social distancing, avoiding in person jury duty.  Letter provided Formatting of this note might be different from the original. Last Assessment & Plan:  Myeloma treatment is going well, he has upcoming oncology visit   Multiple myeloma in remission ([place]) 07/22/2016   Last Assessment & Plan:  Formatting of this note might be different from the original.  Patient remains in remission, followed by oncology.  Had labs drawn today   Palliative care by specialist    Pancreatitis 03/02/2016   Peripheral neuropathy 07/22/2016   Last Assessment & Plan:  Overall improved. Patient has discontinued gabapentin, did not feel much difference  Feet and fingers.  Multifactorial, DM, Chemo, MM  Last Assessment & Plan:   Doing well on gabapentin Formatting of this note might be different from the original. Last Assessment & Plan:  Overall improved. Patient has discontinued gabapentin, did not feel much difference   Physical deconditioning 06/13/2016   Last Assessment & Plan:  Marked  weakness.  Will request further home assistance per home health with bathing.  Discharged on a supplement- patient reports is unaffordable.  Advised prefer food as no barriers, appetite reasonable, ok to use glucerna or carnation instant breakfast if desired.   Pre-operative clearance 01/30/2017   Protein-calorie malnutrition, severe 03/03/2016   Right hip pain 09/27/2019   Last Assessment & Plan:  Formatting of this note might be different from the original.  Recent tests reassuring for no malignancy.  known hip arthritis.  Symptoms today separate from neuropathy.  He plans on starting program with trainer at [facility], declined PT today for gait and strength.  Will reassess at followup.   Screening for malignant neoplasm of colon 05/02/2020   Sinus tachycardia 05/13/2016   SIRS (systemic inflammatory response syndrome) ([place]) 05/12/2016   Stage 3 chronic kidney disease ([place]) 06/13/2016   Last Assessment & Plan:  Will check Cr/K today  June 2020, annual follow-up [place] [facility]  Last Assessment & Plan:   Recent labs reviewed- stable Formatting of this note might be different from the original. Last  Assessment & Plan:  Will check Cr/K today   Type 2 diabetes mellitus ([place]) 07/21/2011   Last Assessment & Plan:   Diabetes improved with fasting glucoses in a safe range.  Continue current regimen. Formatting of this note might be different from the original. Last Assessment & Plan:  Patient's diabetes has been well controlled at baseline on 12 units of Lantus daily.  He reports some hyperglycemia on the first few days after dexamethasone with resolution by the end of the week. We'll   Upper abdominal pain    Urinary retention    Vitamin D deficiency 05/02/2020   Weakness generalized     Past Surgical History:  Procedure Laterality Date   CARDIAC CATHETERIZATION  2006   CORONARY ANGIOPLASTY WITH STENT PLACEMENT  2007   IR FLUORO GUIDE CV LINE RIGHT  05/20/2016   IR US GUIDE VASC ACCESS RIGHT  05/20/2016    Social  History:   reports that he has never smoked. He has never used smokeless tobacco. He reports that he does not drink alcohol and does not use drugs.  No Known Allergies  Family History  Problem Relation Age of Onset   Arrhythmia Mother    Heart disease Mother    Diabetes Sister    Diabetes Brother    Cancer Brother    Stroke Father    Diabetes Brother      Prior to Admission medications   Medication Sig Start Date End Date Taking? Authorizing Provider  amLODipine (NORVASC) 10 MG tablet Take 10 mg by mouth daily.   Yes [provider]  aspirin 81 MG tablet Take 81 mg by mouth daily.   Yes [provider]  atorvastatin (LIPITOR) 20 MG tablet Take 1 tablet (20 mg total) by mouth daily. 12/06/12  Yes Burtis Junes, NP  Cholecalciferol 10 MCG (400 UNIT) CAPS Take 1 capsule by mouth daily.   Yes [provider]  gabapentin (NEURONTIN) 300 MG capsule Take 300 mg by mouth daily. 08/16/19  Yes [provider]  LANTUS SOLOSTAR 100 UNIT/ML Solostar Pen Inject 16 Units into the skin daily. 11/30/20  Yes [provider]  lenalidomide (REVLIMID) 10 MG capsule TAKE 1 CAPSULE BY MOUTH EVERY DAY FOR 21 DAYS THEN 7 DAYS OFF 08/07/21  Yes Brunetta Genera, MD  lisinopril (ZESTRIL) 2.5 MG tablet Take 2.5 mg by mouth daily. 09/30/19  Yes [provider]  metoprolol succinate (TOPROL-XL) 100 MG 24 hr tablet Take 100 mg by mouth daily.  08/29/19  Yes [provider]  nitroGLYCERIN (NITROSTAT) 0.4 MG SL tablet Place 1 tablet (0.4 mg total) under the tongue every 5 (five) minutes x 3 doses as needed for chest pain. 11/13/12  Yes Edmisten, Brooke O, PA-C  vitamin B-12 (CYANOCOBALAMIN) 1000 MCG tablet Take 1,000 mcg by mouth daily.   Yes [provider]    Physical Exam: Vitals:   08/31/21 2157 09/01/21 0200  BP: (!) 158/87 (!) 153/70  Pulse: 77 77  Resp: 18 (!) 24  Temp: (!) 100.5 F (38.1 C)   TempSrc: Oral   SpO2: 98% 100%     Constitutional: NAD, calm  Eyes: PERTLA, lids and conjunctivae normal ENMT: Mucous membranes are moist. Posterior pharynx clear of any exudate or lesions.   Neck: supple, no masses  Respiratory: no wheezing, no crackles. No accessory muscle use.  Cardiovascular: S1 & S2 heard, regular rate and rhythm. No significant JVD. Abdomen: No distension, no tenderness, soft. Bowel sounds active.  Musculoskeletal:  no clubbing / cyanosis. No joint deformity upper and lower extremities.   Skin: no significant rashes, lesions, ulcers. Warm, dry, well-perfused. Neurologic: CN 2-12 grossly intact. Moving all extremities. Alert and oriented.  Psychiatric: Pleasant. Cooperative.    Labs and Imaging on Admission: I have personally reviewed following labs and imaging studies  CBC: Recent Labs  Lab 08/31/21 2211  WBC 5.6  HGB 11.7*  HCT 36.6*  MCV 89.1  PLT 498*   Basic Metabolic Panel: Recent Labs  Lab 08/31/21 2211  NA 136  K 3.7  CL 104  CO2 22  GLUCOSE 120*  BUN 18  CREATININE 1.53*  CALCIUM 8.7*   GFR: CrCl cannot be calculated (Unknown ideal weight.). Liver Function Tests: No results for input(s): "AST", "ALT", "ALKPHOS", "BILITOT", "PROT", "ALBUMIN" in the last 168 hours. No results for input(s): "LIPASE", "AMYLASE" in the last 168 hours. No results for input(s): "AMMONIA" in the last 168 hours. Coagulation Profile: Recent Labs  Lab 09/01/21 0152  INR 1.2   Cardiac Enzymes: No results for input(s): "CKTOTAL", "CKMB", "CKMBINDEX", "TROPONINI" in the last 168 hours. BNP (last 3 results) No results for input(s): "PROBNP" in the last 8760 hours. HbA1C: No results for input(s): "HGBA1C" in the last 72 hours. CBG: No results for input(s): "GLUCAP" in the last 168 hours. Lipid Profile: No results for input(s): "CHOL", "HDL", "LDLCALC", "TRIG", "CHOLHDL", "LDLDIRECT" in the last 72 hours. Thyroid Function Tests: No results for input(s): "TSH", "T4TOTAL", "FREET4",  "T3FREE", "THYROIDAB" in the last 72 hours. Anemia Panel: No results for input(s): "VITAMINB12", "FOLATE", "FERRITIN", "TIBC", "IRON", "RETICCTPCT" in the last 72 hours. Urine analysis:    Component Value Date/Time   COLORURINE AMBER (A) 05/17/2016 1736   APPEARANCEUR HAZY (A) 05/17/2016 1736   LABSPEC 1.012 05/17/2016 1736   PHURINE 5.0 05/17/2016 1736   GLUCOSEU NEGATIVE 05/17/2016 1736   HGBUR MODERATE (A) 05/17/2016 1736   BILIRUBINUR NEGATIVE 05/17/2016 1736   KETONESUR NEGATIVE 05/17/2016 1736   PROTEINUR NEGATIVE 05/17/2016 1736   NITRITE NEGATIVE 05/17/2016 1736   LEUKOCYTESUR TRACE (A) 05/17/2016 1736   Sepsis Labs: _0 (procalcitonin:4,lacticidven:4) )No results found for this or any previous visit (from the past 240 hour(s)).   Radiological Exams on Admission: CT Angio Chest/Abd/Pel for Dissection W and/or Wo Contrast  Result Date: 09/01/2021 CLINICAL DATA:  Chest/abdominal pain, elevated troponin, evaluate for dissection EXAM: CT ANGIOGRAPHY CHEST, ABDOMEN AND PELVIS TECHNIQUE: Non-contrast CT of the chest was initially obtained. Multidetector CT imaging through the chest, abdomen and pelvis was performed using the standard protocol during bolus administration of intravenous contrast. Multiplanar reconstructed images and MIPs were obtained and reviewed to evaluate the vascular anatomy. RADIATION DOSE REDUCTION: This exam was performed according to the departmental dose-optimization program which includes automated exposure control, adjustment of the mA and/or kV according to patient size and/or use of iterative reconstruction technique. CONTRAST:  9m OMNIPAQUE IOHEXOL 350 MG/ML SOLN COMPARISON:  Chest radiograph dated 08/31/2024. CT abdomen/pelvis dated 07/08/2021. FINDINGS: CTA CHEST FINDINGS Cardiovascular: On unenhanced CT, there is no evidence of intramural hematoma. Preferential opacification of the thoracic aorta. No evidence of thoracic aortic aneurysm or  dissection. Atherosclerotic calcifications of the aortic arch. Although not tailored for evaluation of the pulmonary arteries, there are lobar/segmental pulmonary emboli in the right middle and lower lobes (series 6/images 76, 79, and 89). Overall clot burden is small. Cardiomegaly. Elevated RV to LV ratio (1.13), raising the possibility of right heart strain. No pericardial effusion. Three vessel coronary atherosclerosis. Mediastinum/Nodes: No suspicious mediastinal lymphadenopathy.  Visualized thyroid is unremarkable. Lungs/Pleura: Mild eventration of the right hemidiaphragm with associated patchy right middle and lower lobe opacities, likely atelectasis. However, developing pulmonary infarct in the right lower lobe is suspected (series 8/image 96). No suspicious pulmonary nodules. No pleural effusion or pneumothorax. Musculoskeletal: Visualized osseous structures are within normal limits. Review of the MIP images confirms the above findings. CTA ABDOMEN AND PELVIS FINDINGS VASCULAR Aorta: Patent.  Atherosclerotic calcifications. Celiac: Patent. SMA: Patent. Renals: Patent bilaterally.  Atherosclerotic calcifications. IMA: Patent. Inflow: Patent bilaterally.  Atherosclerotic calcifications. Veins: Unremarkable. Review of the MIP images confirms the above findings. NON-VASCULAR Hepatobiliary: Liver is within normal limits. Gallbladder is unremarkable. No intrahepatic or extrahepatic ductal dilatation. Pancreas: Within normal limits. Spleen: Within normal limits. Adrenals/Urinary Tract: Adrenal glands are within normal limits. Right kidney is within normal limits. 10 mm simple left lower pole renal cyst (series 6/image 169), benign (Bosniak I). No follow-up is recommended. Bladder is underdistended but unremarkable. Stomach/Bowel: Stomach is notable for a tiny hiatal hernia. No evidence of bowel obstruction. Normal appendix (series 6/image 229). No colonic wall thickening or inflammatory changes. Lymphatic: No  suspicious abdominopelvic lymphadenopathy. Reproductive: Prostatomegaly, suggesting BPH. Other: No abdominopelvic ascites. Musculoskeletal: Mild degenerative changes at L3-4. Review of the MIP images confirms the above findings. IMPRESSION: No evidence of thoracoabdominal aortic aneurysm or dissection. Lobar/segmental pulmonary emboli in the right middle and lower lobes. Overall clot burden is small. Positive for acute PE with CT evidence of right heart strain (RV/LV Ratio = 1.13) consistent with at least submassive (intermediate risk) PE. The presence of right heart strain has been associated with an increased risk of morbidity and mortality. Please refer to the "Code PE Focused" order set in EPIC. Suspected developing pulmonary infarct in the right lower lobe. Critical Value/emergent results were called by telephone at the time of interpretation on 09/01/2021 at 1:15 am to provider Dr Betsey Holiday, who verbally acknowledged these results. Electronically Signed   By: Julian Hy M.D.   On: 09/01/2021 01:16   DG Chest 2 View  Result Date: 08/31/2021 CLINICAL DATA:  Chest pain EXAM: CHEST - 2 VIEW COMPARISON:  07/17/2021 FINDINGS: Right basilar airspace opacity could reflect atelectasis or infiltrate. Left lung clear. Heart is normal size. No effusions. No acute bony abnormality. IMPRESSION: Right base atelectasis or infiltrate. Electronically Signed   By: Rolm Baptise M.D.   On: 08/31/2021 22:56    EKG: Independently reviewed. SR, PVCs, RAD.   Assessment/Plan   1. Pulmonary embolism  - Presents with acute-onset pleuritic chest pain and found to have PE involving RML and RLL with RV/LV ratio of 1.13  - He is hemodynamically stable in ED; troponin mildly elevated and BNP pending  - Started on IV heparin in ED  - Possibly related to recent COVID infection; lenalidomide may increase risk of VTE  - Continue IV heparin, check echocardiogram and LE venous dopplers    2. Fever  - Temp 38.1 C on arrival  without other SIRS criteria or apparent infection  - Culture blood, monitor off of antibiotics    3. Insulin-dependent DM  - A1c was 7.2% in April 2023  - Continue CBG checks and insulin    4. Hypertension  - Continue Norvasc and lisinopril  - Given concern for heart strain on CT, hold beta-blocker pending echo    5. CKD IIIa  - SCr is 1.53 in ED; baseline appears to be 1.4  - Renally-dose medications, monitor    6. Thrombocytopenia  - Platelets 109,000 in  ED, down from 160,000 last month  - Possibly d/t infection given fever; could be from lenalidomide  - Daily CBC   7. Multiple myeloma  - Hold lenalidomide for now    DVT prophylaxis: IV heparin  Code Status: Full  Level of Care: Level of care: Telemetry Medical Family Communication: none present  Disposition Plan:  Patient is from: home  Anticipated d/c is to: Home  Anticipated d/c date is: 09/04/21  Patient currently: Pending echocardiogram, LE venous dopplers, transition to oral anticoagulant, pain-control  Consults called: None  Admission status: Inpatient     Vianne Bulls, MD [facility]  09/01/2021, 2:47 AM

## 2021-09-01 NOTE — Progress Notes (Signed)
Subjective: Patient admitted this morning, see detailed H&P by Dr Myna Hidalgo 76 year old male with a history of multiple myeloma in remission, CKD stage IIIa, diabetes mellitus type 2, hypertension, CAD presented to the ED with complaints of acute onset right-sided pleuritic chest pain.  In the ED CT of the chest abdomen/pelvis was notable for pulmonary embolism involving the right middle and right lower lobes with small overall clot burden with RV/LV ratio of 1.13.  Patient was started on IV heparin in the ED.  Vitals:   09/01/21 1200 09/01/21 1500  BP: (!) 143/74 (!) 149/67  Pulse: 74 68  Resp: 18 16  Temp:    SpO2: 99% 96%      A/P Pulmonary embolism -Seen on CTA chest; RV/LV ratio 1.13 -Echocardiogram showed EF of 60 to 65%.  Grade 1 diastolic dysfunction.  Normal right ventricular function.  Right ventricular size is normal. -Patient started on IV heparin per pharmacy -Currently not requiring oxygen, O2 sats 97% on room air  DVT -Venous duplex of lower extremity shows DVT involving left posterior tibial veins -Patient is currently on IV heparin as above  Peripheral arterial disease -Patient complained of claudication symptoms to his home health nurse -During venous study tech noted what appears to be occlusion in the mid to distal SFA -Called and discussed with vascular surgery Dr. Trula Slade.  He will see patient in consult -Continue aspirin -Patient will be discharged on aspirin and Eliquis  Multiple myeloma -Hold lenalidomide  Thrombocytopenia -Platelet count down to 109,000 -Could be from lenalidomide -Follow CBC in a.m.  Hypertension -Continue Norvasc, lisinopril  CKD stage IIIa -Serum creatinine is 1.53 -Baseline appears to 1.4       [place]

## 2021-09-01 NOTE — ED Notes (Signed)
Pt brought back for reassessment/lab draw, pt c/o increased pain in the right upper quadrant just under the ribs. PA reassessing patient at this time.

## 2021-09-01 NOTE — Progress Notes (Signed)
ANTICOAGULATION CONSULT NOTE - Initial Consult  Pharmacy Consult for Heparin Indication: pulmonary embolus  No Known Allergies  Patient Measurements:    Vital Signs: Temp: 100.5 F (38.1 C) (08/26 2157) Temp Source: Oral (08/26 2157) BP: 158/87 (08/26 2157) Pulse Rate: 77 (08/26 2157)  Labs: Recent Labs    08/31/21 2211  HGB 11.7*  HCT 36.6*  PLT 109*  CREATININE 1.53*  TROPONINIHS 34*    CrCl cannot be calculated (Unknown ideal weight.).   Medical History: Past Medical History:  Diagnosis Date   Abdominal fullness 02/26/2016   Last Assessment & Plan:  overall improving with no pain, no further nausea and normal BM today.  No signs of obstruction or infection today.  Due for 10 year colonoscopy, mild anemia in December, will refer to GI   Acute renal failure ([place]) 03/06/2016   Acute renal failure superimposed on chronic kidney disease ([place]) 03/01/2016   AKI (acute kidney injury) ([place])    Arterial insufficiency of lower extremity ([place]) 09/01/2016   Overview:  Mild (2018)  Last Assessment & Plan:  Mild arterial insufficiency, he does not have classic symptoms of claudication. Advised daily foot checks and would consider further evaluation if new or worsening symptoms  Mild (2018)  Last Assessment & Plan:  asymptomatic.  We will follow-up as needed Formatting of this note might be different from the original. Mild (2018)  Last Assessment & Pla   Atherosclerotic heart disease of native coronary artery without angina pectoris 05/02/2020   Benign hypertension 07/21/2011   Last Assessment & Plan:  Formatting of this note might be different from the original.  We discussed blood pressure above goal today. Previously well controlled. I recommended he continue current medications, focus on lifestyle modifications including low-sodium diet and will reassess in 3 months   Benign prostatic hyperplasia 05/02/2020   CAD (coronary artery disease) 2006   stent LAD 3.0x18 Cypher, occluded RCA    Cancer ([place])    multiple myeloma   Cervical lymphadenopathy 03/31/2016   Colitis 05/12/2016   Coronary artery disease 07/21/2011   stent LAD 3.0x18 Cypher, occluded RCA  Overview:  Stent 2006 at [facility], Low risk stress test feb 2017  Last Assessment & Plan:  Declined further stress testing in 2015 due to cost and absence of symptoms.  Continues to be asymptomatic.  He has upcoming follow-up with cardiology for review. 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  Patient is up-to-date on Covid booster   Infected tooth 03/31/2016   Leukocytosis    Lipoma 07/19/2014   Overview:  Right scalp.  Seen by Payton Mccallum in past  Last Assessment & Plan:  Lipoma on right scalp getting larger, will refer to ENT, but more concerned about lymph node   Lymphadenopathy 11/21/2015   Last Assessment & Plan:  Right submandibular lymph node, non-tender, now larger. CBC 2 months ago showed mild anemia,  normal peripheral smear.  Will recheck today along with ESR, sed rate.  Will refer to ENT for further evaluation.    Last Assessment & Plan:   Soft, right-sided postauricular mass, deferred intervention in 2018 at onset of myeloma diagnosis, gradually enlarging over time.  Had cons   Monoclonal gammopathy    Multiple myeloma ([place]) 03/20/2016   Last Assessment & Plan:  Given dx, rec social distancing, avoiding in person jury duty.  Letter provided  Formatting of this note might be different from the original. Last Assessment & Plan:  Myeloma treatment is going well, he has upcoming oncology visit   Multiple myeloma in remission ([place]) 07/22/2016   Last Assessment & Plan:  Formatting of this note might be different from the original.  Patient remains in remission, followed by oncology.  Had labs drawn today   Palliative care by specialist    Pancreatitis 03/02/2016   Peripheral neuropathy 07/22/2016   Last Assessment & Plan:  Overall improved. Patient has discontinued gabapentin, did not feel much difference  Feet and fingers.  Multifactorial, DM, Chemo, MM  Last Assessment & Plan:   Doing well on gabapentin Formatting of this note might be different from the original. Last Assessment & Plan:  Overall improved. Patient has discontinued gabapentin, did not feel much difference   Physical deconditioning 06/13/2016   Last Assessment & Plan:  Marked weakness.  Will request further home assistance per home health with bathing.  Discharged on a supplement- patient reports is unaffordable.  Advised prefer food as no barriers, appetite reasonable, ok to use glucerna or carnation instant breakfast if desired.   Pre-operative clearance 01/30/2017   Protein-calorie malnutrition, severe 03/03/2016   Right hip pain 09/27/2019   Last Assessment & Plan:  Formatting of this note might be different from the original.  Recent tests reassuring for no malignancy.  known hip arthritis.  Symptoms today separate from neuropathy.  He plans on starting program with trainer at [facility], declined PT today for gait and strength.  Will reassess at followup.   Screening for malignant neoplasm of colon 05/02/2020   Sinus tachycardia 05/13/2016   SIRS (systemic inflammatory response syndrome) ([place]) 05/12/2016   Stage 3 chronic kidney disease ([place]) 06/13/2016   Last Assessment & Plan:  Will check Cr/K today  June 2020, annual follow-up [place] kidney  Last Assessment & Plan:   Recent labs  reviewed- stable Formatting of this note might be different from the original. Last Assessment & Plan:  Will check Cr/K today   Type 2 diabetes mellitus ([place]) 07/21/2011   Last Assessment & Plan:   Diabetes improved with fasting glucoses in a safe range.  Continue current regimen. Formatting of this note might be different from the original. Last Assessment & Plan:  Patient's diabetes has been well controlled at baseline on 12 units of Lantus daily.  He reports some hyperglycemia on the first few days after dexamethasone with resolution by the end of the week. We'll   Upper abdominal pain    Urinary retention    Vitamin D deficiency 05/02/2020   Weakness generalized     Medications:  No current facility-administered medications on file prior to encounter.   Current Outpatient Medications on File Prior to Encounter  Medication Sig Dispense Refill   amLODipine (NORVASC) 10 MG tablet Take 10 mg by mouth daily.     amoxicillin-clavulanate (AUGMENTIN) 875-125 MG tablet Take 1 tablet by mouth 2 (two) times daily.     aspirin 81 MG tablet  Take 81 mg by mouth 2 (two) times daily.     atorvastatin (LIPITOR) 20 MG tablet Take 1 tablet (20 mg total) by mouth daily. 90 tablet 3   Cholecalciferol 10 MCG (400 UNIT) CAPS Take 1 capsule by mouth daily.     gabapentin (NEURONTIN) 300 MG capsule Take 300 mg by mouth daily.     LANTUS SOLOSTAR 100 UNIT/ML Solostar Pen Inject 16 Units into the skin daily.     lenalidomide (REVLIMID) 10 MG capsule TAKE 1 CAPSULE BY MOUTH EVERY DAY FOR 21 DAYS THEN 7 DAYS OFF 21 capsule 0   lisinopril (ZESTRIL) 2.5 MG tablet Take 2.5 mg by mouth daily.     metoprolol succinate (TOPROL-XL) 100 MG 24 hr tablet Take 100 mg by mouth daily.      nitroGLYCERIN (NITROSTAT) 0.4 MG SL tablet Place 1 tablet (0.4 mg total) under the tongue every 5 (five) minutes x 3 doses as needed for chest pain. 30 tablet 12   vitamin B-12 (CYANOCOBALAMIN) 1000 MCG tablet Take 1,000 mcg by mouth daily.        Assessment: 76 y.o. male with PE for heparin Goal of Therapy:  Heparin level 0.3-0.7 units/ml Monitor platelets by anticoagulation protocol: Yes   Plan:  Heparin 4000 units IV bolus, then start heparin 1300 units/hr Check heparin level in 8 hours.   Douglas Rose, Bronson Curb 09/01/2021,1:27 AM

## 2021-09-01 NOTE — ED Notes (Signed)
Wife to nursing station, reporting pt was having increased sob. Pt c/o R sided chest pain, tender on palpation, appears uncomfortable. 95% RA, placed on 2L oxygen for comfort. MD made aware

## 2021-09-01 NOTE — ED Provider Notes (Signed)
Patient was reassessed, he is endorsing worsening sharp pain in his right side, he also has a elevated troponin, I am concerned at this point for possible dissection, will send down for a dissection study, will also make him a level 2 as he will need close monitoring.   Marcello Fennel, PA-C 09/01/21 0030    Orpah Greek, MD 09/01/21 [phone]

## 2021-09-01 NOTE — Progress Notes (Signed)
ANTICOAGULATION CONSULT NOTE  Pharmacy Consult for Heparin Indication: pulmonary embolus and DVT  No Known Allergies  Patient Measurements: Height: 5' 11"  (180.3 cm) Weight: 77 kg (169 lb 12.1 oz) IBW/kg (Calculated) : 75.3 Heparin Dosing Weight: 77 kg  Vital Signs: Temp: 99 F (37.2 C) (08/27 1731) Temp Source: Oral (08/27 1731) BP: 153/75 (08/27 1731) Pulse Rate: 78 (08/27 1731)  Labs: Recent Labs    08/31/21 2211 09/01/21 0046 09/01/21 0152 09/01/21 0344 09/01/21 1106 09/01/21 1842  HGB 11.7*  --   --   --   --   --   HCT 36.6*  --   --   --   --   --   PLT 109*  --   --   --   --   --   APTT  --   --  24  --   --   --   LABPROT  --   --  14.6  --   --   --   INR  --   --  1.2  --   --   --   HEPARINUNFRC  --   --   --  0.76* 0.41 0.33  CREATININE 1.53*  --   --   --   --   --   TROPONINIHS 34* 29*  --   --   --   --      Estimated Creatinine Clearance: 43.1 mL/min (A) (by C-G formula based on SCr of 1.53 mg/dL (H)).   Medical History: Past Medical History:  Diagnosis Date   Abdominal fullness 02/26/2016   Last Assessment & Plan:  overall improving with no pain, no further nausea and normal BM today.  No signs of obstruction or infection today.  Due for 10 year colonoscopy, mild anemia in December, will refer to GI   Acute renal failure ([place]) 03/06/2016   Acute renal failure superimposed on chronic kidney disease ([place]) 03/01/2016   AKI (acute kidney injury) ([place])    Arterial insufficiency of lower extremity ([place]) 09/01/2016   Overview:  Mild (2018)  Last Assessment & Plan:  Mild arterial insufficiency, he does not have classic symptoms of claudication. Advised daily foot checks and would consider further evaluation if new or worsening symptoms  Mild (2018)  Last Assessment & Plan:  asymptomatic.  We will follow-up as needed Formatting of this note might be different from the original. Mild (2018)  Last Assessment & Pla   Atherosclerotic heart disease of native  coronary artery without angina pectoris 05/02/2020   Benign hypertension 07/21/2011   Last Assessment & Plan:  Formatting of this note might be different from the original.  We discussed blood pressure above goal today. Previously well controlled. I recommended he continue current medications, focus on lifestyle modifications including low-sodium diet and will reassess in 3 months   Benign prostatic hyperplasia 05/02/2020   CAD (coronary artery disease) 2006   stent LAD 3.0x18 Cypher, occluded RCA   Cancer ([place])    multiple myeloma   Cervical lymphadenopathy 03/31/2016   Colitis 05/12/2016   Coronary artery disease 07/21/2011   stent LAD 3.0x18 Cypher, occluded RCA  Overview:  Stent 2006 at [facility], Low risk stress test feb 2017  Last Assessment & Plan:  Declined further stress testing in 2015 due to cost and absence of symptoms.  Continues to be asymptomatic.  He has upcoming follow-up with cardiology for review. 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  Patient is up-to-date on Covid booster   Infected tooth 03/31/2016   Leukocytosis    Lipoma 07/19/2014   Overview:  Right scalp.  Seen by Payton Mccallum in past  Last Assessment & Plan:  Lipoma on right scalp getting larger, will refer to ENT, but more concerned about lymph node   Lymphadenopathy 11/21/2015   Last Assessment & Plan:  Right submandibular lymph node, non-tender, now  larger. CBC 2 months ago showed mild anemia, normal peripheral smear.  Will recheck today along with ESR, sed rate.  Will refer to ENT for further evaluation.    Last Assessment & Plan:   Soft, right-sided postauricular mass, deferred intervention in 2018 at onset of myeloma diagnosis, gradually enlarging over time.  Had cons   Monoclonal gammopathy    Multiple myeloma ([place]) 03/20/2016   Last Assessment & Plan:  Given dx, rec social distancing, avoiding in person jury duty.  Letter provided Formatting of this note might be different from the original. Last Assessment & Plan:  Myeloma treatment is going well, he has upcoming oncology visit   Multiple myeloma in remission ([place]) 07/22/2016   Last Assessment & Plan:  Formatting of this note might be different from the original.  Patient remains in remission, followed by oncology.  Had labs drawn today   Palliative care by specialist    Pancreatitis 03/02/2016   Peripheral neuropathy 07/22/2016   Last Assessment & Plan:  Overall improved. Patient has discontinued gabapentin, did not feel much difference  Feet and fingers.  Multifactorial, DM, Chemo, MM  Last Assessment & Plan:   Doing well on gabapentin Formatting of this note might be different from the original. Last Assessment & Plan:  Overall improved. Patient has discontinued gabapentin, did not feel much difference   Physical deconditioning 06/13/2016   Last Assessment & Plan:  Marked weakness.  Will request further home assistance per home health with bathing.  Discharged on a supplement- patient reports is unaffordable.  Advised prefer food as no barriers, appetite reasonable, ok to use glucerna or carnation instant breakfast if desired.   Pre-operative clearance 01/30/2017   Protein-calorie malnutrition, severe 03/03/2016   Right hip pain 09/27/2019   Last Assessment & Plan:  Formatting of this note might be different from the original.  Recent tests reassuring for no malignancy.  known hip arthritis.   Symptoms today separate from neuropathy.  He plans on starting program with trainer at [facility], declined PT today for gait and strength.  Will reassess at followup.   Screening for malignant neoplasm of colon 05/02/2020   Sinus tachycardia 05/13/2016   SIRS (systemic inflammatory response syndrome) ([place]) 05/12/2016   Stage 3 chronic kidney disease ([place]) 06/13/2016   Last Assessment & Plan:  Will check Cr/K today  June 2020, annual follow-up [place] kidney  Last Assessment & Plan:   Recent labs reviewed- stable Formatting of this note might be different from the  original. Last Assessment & Plan:  Will check Cr/K today   Type 2 diabetes mellitus ([place]) 07/21/2011   Last Assessment & Plan:   Diabetes improved with fasting glucoses in a safe range.  Continue current regimen. Formatting of this note might be different from the original. Last Assessment & Plan:  Patient's diabetes has been well controlled at baseline on 12 units of Lantus daily.  He reports some hyperglycemia on the first few days after dexamethasone with resolution by the end of the week. We'll   Upper abdominal pain    Urinary retention    Vitamin D deficiency 05/02/2020   Weakness generalized     Medications:  Medications Prior to Admission  Medication Sig Dispense Refill Last Dose   amLODipine (NORVASC) 10 MG tablet Take 10 mg by mouth daily.   08/31/2021   aspirin 81 MG tablet Take 81 mg by mouth daily.   08/31/2021   atorvastatin (LIPITOR) 20 MG tablet Take 1 tablet (20 mg total) by mouth daily. 90 tablet 3 08/31/2021   Cholecalciferol 10 MCG (400 UNIT) CAPS Take 1 capsule by mouth daily.   08/31/2021   gabapentin (NEURONTIN) 300 MG capsule Take 300 mg by mouth daily.   08/31/2021   LANTUS SOLOSTAR 100 UNIT/ML Solostar Pen Inject 16 Units into the skin daily.   08/31/2021   lenalidomide (REVLIMID) 10 MG capsule TAKE 1 CAPSULE BY MOUTH EVERY DAY FOR 21 DAYS THEN 7 DAYS OFF 21 capsule 0 08/30/2021   lisinopril (ZESTRIL) 2.5 MG tablet Take 2.5 mg  by mouth daily.   08/31/2021   metoprolol succinate (TOPROL-XL) 100 MG 24 hr tablet Take 100 mg by mouth daily.    08/31/2021 at 1130   nitroGLYCERIN (NITROSTAT) 0.4 MG SL tablet Place 1 tablet (0.4 mg total) under the tongue every 5 (five) minutes x 3 doses as needed for chest pain. 30 tablet 12 unknown   vitamin B-12 (CYANOCOBALAMIN) 1000 MCG tablet Take 1,000 mcg by mouth daily.   08/31/2021    Scheduled:   amLODipine  10 mg Oral Daily   atorvastatin  20 mg Oral Daily   carbamide peroxide  5 drop Left EAR BID   gabapentin  300 mg Oral Daily   insulin aspart  0-5 Units Subcutaneous QHS   insulin aspart  0-9 Units Subcutaneous TID WC   insulin glargine-yfgn  10 Units Subcutaneous Daily   lisinopril  2.5 mg Oral Daily   sodium chloride flush  3 mL Intravenous Q12H   Infusions:   heparin 1,300 Units/hr (09/01/21 1800)   PRN: acetaminophen **OR** acetaminophen, fentaNYL (SUBLIMAZE) injection, ondansetron **OR** ondansetron (ZOFRAN) IV, oxyCODONE, senna-docusate  Assessment: 77 yom with a history of multiple myeloma in remission, CKD, IDDM, HTN, CAD. Patient is presenting with onset of right-sided pleuritic chest pain. Heparin per pharmacy consult placed for pulmonary embolus and DVT.  CT PE w/ PE in rt middle/lower lobes - small clot burden CT evidence of RHS [name] w/ lt DVT & possible for occlusion to mid-distal SFA  Patient is not on anticoagulation prior to arrival. Heparin started at 1300 units/hr following 4000 units bolus. Repeat heparin level is therapeutic at 0.33 but on lower end of range, will increase infusion slightly to keep therapeutic.  Goal of Therapy:  Heparin level 0.3-0.7 units/ml Monitor platelets by anticoagulation protocol: Yes   Plan:  Increase heparin to 1350 units/h Daily heparin level and CBC  Arrie Senate, PharmD, BCPS, [facility] Clinical Pharmacist [phone] Please check AMION for all [place]  numbers 09/01/2021

## 2021-09-01 NOTE — Progress Notes (Signed)
VASCULAR LAB    Bilateral lower extremity venous duplex has been performed.  See CV proc for preliminary results.   Emersynn Deatley, RVT 09/01/2021, 9:35 AM

## 2021-09-01 NOTE — ED Provider Notes (Signed)
[facility] EMERGENCY DEPARTMENT Provider Note   CSN: [id number] Arrival date & time: 08/31/21  2153     History  Chief Complaint  Patient presents with   Chest Pain    Douglas Rose. is a 76 y.o. male.  Patient presents to the emergency department for evaluation of sudden onset of right-sided pain.  Patient reports that he was hanging curtains when he suddenly felt a pain in the right side of his abdomen, chest, back and neck.  Patient now feeling short of breath.       Home Medications Prior to Admission medications   Medication Sig Start Date End Date Taking? Authorizing Provider  amLODipine (NORVASC) 10 MG tablet Take 10 mg by mouth daily.   Yes [provider]  atorvastatin (LIPITOR) 20 MG tablet Take 1 tablet (20 mg total) by mouth daily. 12/06/12  Yes Truitt Merle C, NP  lenalidomide (REVLIMID) 10 MG capsule TAKE 1 CAPSULE BY MOUTH EVERY DAY FOR 21 DAYS THEN 7 DAYS OFF 08/07/21  Yes Brunetta Genera, MD  lisinopril (ZESTRIL) 2.5 MG tablet Take 2.5 mg by mouth daily. 09/30/19  Yes [provider]  metoprolol succinate (TOPROL-XL) 100 MG 24 hr tablet Take 100 mg by mouth daily.  08/29/19  Yes [provider]  nitroGLYCERIN (NITROSTAT) 0.4 MG SL tablet Place 1 tablet (0.4 mg total) under the tongue every 5 (five) minutes x 3 doses as needed for chest pain. 11/13/12  Yes Edmisten, Brooke O, PA-C  amoxicillin-clavulanate (AUGMENTIN) 875-125 MG tablet Take 1 tablet by mouth 2 (two) times daily. 07/16/21   [provider]  aspirin 81 MG tablet Take 81 mg by mouth 2 (two) times daily.    [provider]  Cholecalciferol 10 MCG (400 UNIT) CAPS Take 1 capsule by mouth daily.    [provider]  gabapentin (NEURONTIN) 300 MG capsule Take 300 mg by mouth daily. 08/16/19   [provider]  LANTUS SOLOSTAR 100 UNIT/ML Solostar Pen Inject 16 Units into the skin daily. 11/30/20   [provider]   vitamin B-12 (CYANOCOBALAMIN) 1000 MCG tablet Take 1,000 mcg by mouth daily.    [provider]      Allergies    Patient has no known allergies.    Review of Systems   Review of Systems  Physical Exam Updated Vital Signs BP (!) 158/87   Pulse 77   Temp (!) 100.5 F (38.1 C) (Oral)   Resp 18   SpO2 98%  Physical Exam Vitals and nursing note reviewed.  Constitutional:      General: He is not in acute distress.    Appearance: He is well-developed.  HENT:     Head: Normocephalic and atraumatic.     Mouth/Throat:     Mouth: Mucous membranes are moist.  Eyes:     General: Vision grossly intact. Gaze aligned appropriately.     Extraocular Movements: Extraocular movements intact.     Conjunctiva/sclera: Conjunctivae normal.  Cardiovascular:     Rate and Rhythm: Normal rate and regular rhythm.     Pulses: Normal pulses.     Heart sounds: Normal heart sounds, S1 normal and S2 normal. No murmur heard.    No friction rub. No gallop.  Pulmonary:     Effort: Pulmonary effort is normal. Tachypnea present. No respiratory distress.     Breath sounds: Normal breath sounds.  Chest:     Chest wall: Tenderness (Right side) present.  Abdominal:  Palpations: Abdomen is soft.     Tenderness: There is no abdominal tenderness. There is no guarding or rebound.     Hernia: No hernia is present.  Musculoskeletal:        General: No swelling.     Cervical back: Full passive range of motion without pain, normal range of motion and neck supple. No pain with movement, spinous process tenderness or muscular tenderness. Normal range of motion.     Right lower leg: No edema.     Left lower leg: No edema.  Skin:    General: Skin is warm and dry.     Capillary Refill: Capillary refill takes less than 2 seconds.     Findings: No ecchymosis, erythema, lesion or wound.  Neurological:     Mental Status: He is alert and oriented to person, place, and time.     GCS: GCS eye subscore is 4.  GCS verbal subscore is 5. GCS motor subscore is 6.     Cranial Nerves: Cranial nerves 2-12 are intact.     Sensory: Sensation is intact.     Motor: Motor function is intact. No weakness or abnormal muscle tone.     Coordination: Coordination is intact.  Psychiatric:        Mood and Affect: Mood normal.        Speech: Speech normal.        Behavior: Behavior normal.     ED Results / Procedures / Treatments   Labs (all labs ordered are listed, but only abnormal results are displayed) Labs Reviewed  BASIC METABOLIC PANEL - Abnormal; Notable for the following components:      Result Value   Glucose, Bld 120 (*)    Creatinine, Ser 1.53 (*)    Calcium 8.7 (*)    GFR, Estimated 47 (*)    All other components within normal limits  CBC - Abnormal; Notable for the following components:   RBC 4.11 (*)    Hemoglobin 11.7 (*)    HCT 36.6 (*)    RDW 19.1 (*)    Platelets 109 (*)    All other components within normal limits  TROPONIN I (HIGH SENSITIVITY) - Abnormal; Notable for the following components:   Troponin I (High Sensitivity) 34 (*)    All other components within normal limits  HEPARIN LEVEL (UNFRACTIONATED)  TROPONIN I (HIGH SENSITIVITY)    EKG EKG Interpretation  Date/Time:  Saturday August 31 2021 22:03:14 EDT Ventricular Rate:  76 PR Interval:  176 QRS Duration: 76 QT Interval:  376 QTC Calculation: 423 R Axis:   106 Text Interpretation: Normal sinus rhythm Possible Left atrial enlargement Rightward axis Possible Anterior infarct , age undetermined Abnormal ECG Confirmed by Orpah Greek [phone]) on 09/01/2021 1:27:33 AM  Radiology CT Angio Chest/Abd/Pel for Dissection W and/or Wo Contrast  Result Date: 09/01/2021 CLINICAL DATA:  Chest/abdominal pain, elevated troponin, evaluate for dissection EXAM: CT ANGIOGRAPHY CHEST, ABDOMEN AND PELVIS TECHNIQUE: Non-contrast CT of the chest was initially obtained. Multidetector CT imaging through the chest, abdomen and  pelvis was performed using the standard protocol during bolus administration of intravenous contrast. Multiplanar reconstructed images and MIPs were obtained and reviewed to evaluate the vascular anatomy. RADIATION DOSE REDUCTION: This exam was performed according to the departmental dose-optimization program which includes automated exposure control, adjustment of the mA and/or kV according to patient size and/or use of iterative reconstruction technique. CONTRAST:  69m OMNIPAQUE IOHEXOL 350 MG/ML SOLN COMPARISON:  Chest radiograph dated 08/31/2024. CT  abdomen/pelvis dated 07/08/2021. FINDINGS: CTA CHEST FINDINGS Cardiovascular: On unenhanced CT, there is no evidence of intramural hematoma. Preferential opacification of the thoracic aorta. No evidence of thoracic aortic aneurysm or dissection. Atherosclerotic calcifications of the aortic arch. Although not tailored for evaluation of the pulmonary arteries, there are lobar/segmental pulmonary emboli in the right middle and lower lobes (series 6/images 76, 79, and 89). Overall clot burden is small. Cardiomegaly. Elevated RV to LV ratio (1.13), raising the possibility of right heart strain. No pericardial effusion. Three vessel coronary atherosclerosis. Mediastinum/Nodes: No suspicious mediastinal lymphadenopathy. Visualized thyroid is unremarkable. Lungs/Pleura: Mild eventration of the right hemidiaphragm with associated patchy right middle and lower lobe opacities, likely atelectasis. However, developing pulmonary infarct in the right lower lobe is suspected (series 8/image 96). No suspicious pulmonary nodules. No pleural effusion or pneumothorax. Musculoskeletal: Visualized osseous structures are within normal limits. Review of the MIP images confirms the above findings. CTA ABDOMEN AND PELVIS FINDINGS VASCULAR Aorta: Patent.  Atherosclerotic calcifications. Celiac: Patent. SMA: Patent. Renals: Patent bilaterally.  Atherosclerotic calcifications. IMA: Patent.  Inflow: Patent bilaterally.  Atherosclerotic calcifications. Veins: Unremarkable. Review of the MIP images confirms the above findings. NON-VASCULAR Hepatobiliary: Liver is within normal limits. Gallbladder is unremarkable. No intrahepatic or extrahepatic ductal dilatation. Pancreas: Within normal limits. Spleen: Within normal limits. Adrenals/Urinary Tract: Adrenal glands are within normal limits. Right kidney is within normal limits. 10 mm simple left lower pole renal cyst (series 6/image 169), benign (Bosniak I). No follow-up is recommended. Bladder is underdistended but unremarkable. Stomach/Bowel: Stomach is notable for a tiny hiatal hernia. No evidence of bowel obstruction. Normal appendix (series 6/image 229). No colonic wall thickening or inflammatory changes. Lymphatic: No suspicious abdominopelvic lymphadenopathy. Reproductive: Prostatomegaly, suggesting BPH. Other: No abdominopelvic ascites. Musculoskeletal: Mild degenerative changes at L3-4. Review of the MIP images confirms the above findings. IMPRESSION: No evidence of thoracoabdominal aortic aneurysm or dissection. Lobar/segmental pulmonary emboli in the right middle and lower lobes. Overall clot burden is small. Positive for acute PE with CT evidence of right heart strain (RV/LV Ratio = 1.13) consistent with at least submassive (intermediate risk) PE. The presence of right heart strain has been associated with an increased risk of morbidity and mortality. Please refer to the "Code PE Focused" order set in EPIC. Suspected developing pulmonary infarct in the right lower lobe. Critical Value/emergent results were called by telephone at the time of interpretation on 09/01/2021 at 1:15 am to provider Dr Betsey Holiday, who verbally acknowledged these results. Electronically Signed   By: Julian Hy M.D.   On: 09/01/2021 01:16   DG Chest 2 View  Result Date: 08/31/2021 CLINICAL DATA:  Chest pain EXAM: CHEST - 2 VIEW COMPARISON:  07/17/2021 FINDINGS:  Right basilar airspace opacity could reflect atelectasis or infiltrate. Left lung clear. Heart is normal size. No effusions. No acute bony abnormality. IMPRESSION: Right base atelectasis or infiltrate. Electronically Signed   By: Rolm Baptise M.D.   On: 08/31/2021 22:56    Procedures Procedures    Medications Ordered in ED Medications  heparin bolus via infusion 4,000 Units (has no administration in time range)  heparin ADULT infusion 100 units/mL (25000 units/276m) (has no administration in time range)  iohexol (OMNIPAQUE) 350 MG/ML injection 80 mL (80 mLs Intravenous Contrast Given 09/01/21 0104)    ED Course/ Medical Decision Making/ A&P                           Medical Decision Making Amount and/or  Complexity of Data Reviewed Independent Historian: spouse External Data Reviewed: ECG. Labs: ordered. Decision-making details documented in ED Course. Radiology: ordered and independent interpretation performed. Decision-making details documented in ED Course. ECG/medicine tests: ordered and independent interpretation performed. Decision-making details documented in ED Course.   Patient presents to the emergency department for evaluation of right side chest, abdomen pain radiating to the neck and back.  Patient reports sudden onset of symptoms.  Differential diagnosis considered includes acute coronary syndrome, aortic dissection, PE, musculoskeletal pain.  Patient had additional work-up through triage.  Labs returned with a mildly elevated troponin at 34.  Based on this finding, CT angiography was performed to evaluate pulmonary artery tree as well as for aortic dissection as a cause of his constellation of symptoms.  Patient does have PE in the right middle and lower with right heart strain.  Will initiate heparinization, patient require hospitalization for further management.  CRITICAL CARE Performed by: Orpah Greek   Total critical care time: 35 minutes  Critical  care time was exclusive of separately billable procedures and treating other patients.  Critical care was necessary to treat or prevent imminent or life-threatening deterioration.  Critical care was time spent personally by me on the following activities: development of treatment plan with patient and/or surrogate as well as nursing, discussions with consultants, evaluation of patient's response to treatment, examination of patient, obtaining history from patient or surrogate, ordering and performing treatments and interventions, ordering and review of laboratory studies, ordering and review of radiographic studies, pulse oximetry and re-evaluation of patient's condition.         Final Clinical Impression(s) / ED Diagnoses Final diagnoses:  Acute pulmonary embolism with acute cor pulmonale, unspecified pulmonary embolism type [facility])    Rx / DC Orders ED Discharge Orders     None         Deira Shimer, Gwenyth Allegra, MD 09/01/21 [phone]

## 2021-09-01 NOTE — Progress Notes (Signed)
ANTICOAGULATION CONSULT NOTE - Initial Consult  Pharmacy Consult for Heparin Indication: pulmonary embolus and DVT  No Known Allergies  Patient Measurements: Height: 5' 11" (180.3 cm) Weight: 77 kg (169 lb 12.1 oz) IBW/kg (Calculated) : 75.3 Heparin Dosing Weight: 77 kg  Vital Signs: Temp: 98.5 F (36.9 C) (08/27 0734) Temp Source: Oral (08/27 0734) BP: 143/70 (08/27 1039) Pulse Rate: 76 (08/27 1039)  Labs: Recent Labs    08/31/21 2211 09/01/21 0046 09/01/21 0152 09/01/21 0344  HGB 11.7*  --   --   --   HCT 36.6*  --   --   --   PLT 109*  --   --   --   APTT  --   --  24  --   LABPROT  --   --  14.6  --   INR  --   --  1.2  --   HEPARINUNFRC  --   --   --  0.76*  CREATININE 1.53*  --   --   --   TROPONINIHS 34* 29*  --   --     Estimated Creatinine Clearance: 43.1 mL/min (A) (by C-G formula based on SCr of 1.53 mg/dL (H)).   Medical History: Past Medical History:  Diagnosis Date   Abdominal fullness 02/26/2016   Last Assessment & Plan:  overall improving with no pain, no further nausea and normal BM today.  No signs of obstruction or infection today.  Due for 10 year colonoscopy, mild anemia in December, will refer to GI   Acute renal failure ([place]) 03/06/2016   Acute renal failure superimposed on chronic kidney disease ([place]) 03/01/2016   AKI (acute kidney injury) ([place])    Arterial insufficiency of lower extremity ([place]) 09/01/2016   Overview:  Mild (2018)  Last Assessment & Plan:  Mild arterial insufficiency, he does not have classic symptoms of claudication. Advised daily foot checks and would consider further evaluation if new or worsening symptoms  Mild (2018)  Last Assessment & Plan:  asymptomatic.  We will follow-up as needed Formatting of this note might be different from the original. Mild (2018)  Last Assessment & Pla   Atherosclerotic heart disease of native coronary artery without angina pectoris 05/02/2020   Benign hypertension 07/21/2011   Last Assessment &  Plan:  Formatting of this note might be different from the original.  We discussed blood pressure above goal today. Previously well controlled. I recommended he continue current medications, focus on lifestyle modifications including low-sodium diet and will reassess in 3 months   Benign prostatic hyperplasia 05/02/2020   CAD (coronary artery disease) 2006   stent LAD 3.0x18 [place], occluded RCA   Cancer ([place])    multiple myeloma   Cervical lymphadenopathy 03/31/2016   Colitis 05/12/2016   Coronary artery disease 07/21/2011   stent LAD 3.0x18 [place], occluded RCA  Overview:  Stent 2006 at [facility], Low risk stress test feb 2017  Last Assessment & Plan:  Declined further stress testing in 2015 due to cost and absence of symptoms.  Continues to be asymptomatic.  He has upcoming follow-up with cardiology for review. 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  Patient is up-to-date on Covid booster   Infected tooth 03/31/2016   Leukocytosis    Lipoma 07/19/2014   Overview:  Right scalp.  Seen by Derm in past  Last Assessment & Plan:  Lipoma on right scalp getting larger, will refer to ENT, but more concerned about lymph node   Lymphadenopathy 11/21/2015   Last Assessment & Plan:  Right submandibular lymph node, non-tender, now larger. CBC 2 months ago showed mild anemia, normal peripheral smear.  Will recheck today along with  ESR, sed rate.  Will refer to ENT for further evaluation.    Last Assessment & Plan:   Soft, right-sided postauricular mass, deferred intervention in 2018 at onset of myeloma diagnosis, gradually enlarging over time.  Had cons   Monoclonal gammopathy    Multiple myeloma (HCC) 03/20/2016   Last Assessment & Plan:  Given dx, rec social distancing, avoiding in person jury duty.  Letter provided Formatting of this note might be different from the original. Last Assessment & Plan:  Myeloma treatment is going well, he has upcoming oncology visit   Multiple myeloma in remission (HCC) 07/22/2016   Last Assessment & Plan:  Formatting of this note might be different from the original.  Patient remains in remission, followed by oncology.  Had labs drawn today   Palliative care by specialist    Pancreatitis 03/02/2016   Peripheral neuropathy 07/22/2016   Last Assessment & Plan:  Overall improved. Patient has discontinued gabapentin, did not feel much difference  Feet and fingers.  Multifactorial, DM, Chemo, MM  Last Assessment & Plan:   Doing well on gabapentin Formatting of this note might be different from the original. Last Assessment & Plan:  Overall improved. Patient has discontinued gabapentin, did not feel much difference   Physical deconditioning 06/13/2016   Last Assessment & Plan:  Marked weakness.  Will request further home assistance per home health with bathing.  Discharged on a supplement- patient reports is unaffordable.  Advised prefer food as no barriers, appetite reasonable, ok to use glucerna or carnation instant breakfast if desired.   Pre-operative clearance 01/30/2017   Protein-calorie malnutrition, severe 03/03/2016   Right hip pain 09/27/2019   Last Assessment & Plan:  Formatting of this note might be different from the original.  Recent tests reassuring for no malignancy.  known hip arthritis.  Symptoms today separate from neuropathy.  He plans on starting program with trainer at [employer], declined PT  today for gait and strength.  Will reassess at followup.   Screening for malignant neoplasm of colon 05/02/2020   Sinus tachycardia 05/13/2016   SIRS (systemic inflammatory response syndrome) (HCC) 05/12/2016   Stage 3 chronic kidney disease (HCC) 06/13/2016   Last Assessment & Plan:  Will check Cr/K today  June 2020, annual follow-up [place] [facility]  Last Assessment & Plan:   Recent labs reviewed- stable Formatting of this note might be different from the original. Last Assessment & Plan:  Will check Cr/K today   Type 2 diabetes mellitus (HCC) 07/21/2011   Last Assessment & Plan:   Diabetes improved with fasting glucoses in a safe range.  Continue current regimen. Formatting of this note might be different from the original. Last Assessment & Plan:    Patient's diabetes has been well controlled at baseline on 12 units of Lantus daily.  He reports some hyperglycemia on the first few days after dexamethasone with resolution by the end of the week. We'll   Upper abdominal pain    Urinary retention    Vitamin D deficiency 05/02/2020   Weakness generalized     Medications:  (Not in a hospital admission)  Scheduled:   amLODipine  10 mg Oral Daily   atorvastatin  20 mg Oral Daily   gabapentin  300 mg Oral Daily   insulin aspart  0-5 Units Subcutaneous QHS   insulin aspart  0-9 Units Subcutaneous TID WC   insulin glargine-yfgn  10 Units Subcutaneous Daily   lisinopril  2.5 mg Oral Daily   sodium chloride flush  3 mL Intravenous Q12H   Infusions:   heparin 1,300 Units/hr (09/01/21 0155)   PRN: acetaminophen **OR** acetaminophen, fentaNYL (SUBLIMAZE) injection, ondansetron **OR** ondansetron (ZOFRAN) IV, oxyCODONE, senna-docusate  Assessment: 51 yom with a history of multiple myeloma in remission, CKD, IDDM, HTN, CAD. Patient is presenting with onset of right-sided pleuritic chest pain. Heparin per pharmacy consult placed for pulmonary embolus and DVT.  CT PE w/ PE in rt middle/lower lobes - small  clot burden CT evidence of RHS [name] w/ lt DVT & possible for occlusion to mid-distal SFA  Patient is not on anticoagulation prior to arrival. Heparin started at 1300 units/hr following 4000 units bolus. Heparin level this morning is 0.41 which is therapeutic.  No issues with infusion this AM or bleeding per RN  Hgb 11.7; Plt 109 (160 last month) lenalidomide vs infection - now on heparin  aPTT 24 PT/INR 14.6/1.2 HL from ~0400 this AM 0.76 but bolus and drip just started - not on AC pta -- not a true level  Goal of Therapy:  Heparin level 0.3-0.7 units/ml Monitor platelets by anticoagulation protocol: Yes   Plan:  Continue heparin infusion at 1300 units/hr Check anti-Xa level at 1800 and daily while on heparin Continue to monitor H&H and platelets  Lorelei Pont, PharmD, BCPS 09/01/2021 10:57 AM ED Clinical Pharmacist -  [phone]

## 2021-09-01 NOTE — ED Notes (Signed)
Pt transported to vascular.  °

## 2021-09-02 ENCOUNTER — Other Ambulatory Visit (HOSPITAL_COMMUNITY): Payer: Self-pay

## 2021-09-02 ENCOUNTER — Telehealth (HOSPITAL_COMMUNITY): Payer: Self-pay | Admitting: Pharmacy Technician

## 2021-09-02 DIAGNOSIS — I2609 Other pulmonary embolism with acute cor pulmonale: Secondary | ICD-10-CM | POA: Diagnosis not present

## 2021-09-02 LAB — CBC
HCT: 29.9 % — ABNORMAL LOW (ref 39.0–52.0)
Hemoglobin: 9.9 g/dL — ABNORMAL LOW (ref 13.0–17.0)
MCH: 28.8 pg (ref 26.0–34.0)
MCHC: 33.1 g/dL (ref 30.0–36.0)
MCV: 86.9 fL (ref 80.0–100.0)
Platelets: 111 10*3/uL — ABNORMAL LOW (ref 150–400)
RBC: 3.44 MIL/uL — ABNORMAL LOW (ref 4.22–5.81)
RDW: 18.8 % — ABNORMAL HIGH (ref 11.5–15.5)
WBC: 4.6 10*3/uL (ref 4.0–10.5)
nRBC: 0 % (ref 0.0–0.2)

## 2021-09-02 LAB — BASIC METABOLIC PANEL
Anion gap: 7 (ref 5–15)
BUN: 16 mg/dL (ref 8–23)
CO2: 24 mmol/L (ref 22–32)
Calcium: 7.9 mg/dL — ABNORMAL LOW (ref 8.9–10.3)
Chloride: 101 mmol/L (ref 98–111)
Creatinine, Ser: 1.61 mg/dL — ABNORMAL HIGH (ref 0.61–1.24)
GFR, Estimated: 44 mL/min — ABNORMAL LOW (ref >60)
Glucose, Bld: 175 mg/dL — ABNORMAL HIGH (ref 70–99)
Potassium: 3.3 mmol/L — ABNORMAL LOW (ref 3.5–5.1)
Sodium: 132 mmol/L — ABNORMAL LOW (ref 135–145)

## 2021-09-02 LAB — GLUCOSE, CAPILLARY
Glucose-Capillary: 158 mg/dL — ABNORMAL HIGH (ref 70–99)
Glucose-Capillary: 201 mg/dL — ABNORMAL HIGH (ref 70–99)

## 2021-09-02 LAB — HEPARIN LEVEL (UNFRACTIONATED): Heparin Unfractionated: 0.23 IU/mL — ABNORMAL LOW (ref 0.30–0.70)

## 2021-09-02 MED ORDER — APIXABAN 5 MG PO TABS
10.0000 mg | ORAL_TABLET | Freq: Two times a day (BID) | ORAL | Status: DC
  Administered 2021-09-02: 10 mg via ORAL
  Filled 2021-09-02: qty 2

## 2021-09-02 MED ORDER — APIXABAN 5 MG PO TABS: 5.0000 mg | ORAL_TABLET | Freq: Two times a day (BID) | ORAL | Status: DC

## 2021-09-02 MED ORDER — FERROUS SULFATE 325 (65 FE) MG PO TABS
325.0000 mg | ORAL_TABLET | Freq: Every day | ORAL | Status: DC
Start: 2021-09-03 — End: 2021-09-02

## 2021-09-02 MED ORDER — APIXABAN (ELIQUIS) VTE STARTER PACK (10MG AND 5MG)
ORAL_TABLET | ORAL | 0 refills | Status: DC
  Filled 2021-09-02: qty 74, 30d supply, fill #0

## 2021-09-02 NOTE — Discharge Instructions (Signed)

## 2021-09-02 NOTE — TOC Transition Note (Signed)
Transition of Care [facility]) - CM/SW Discharge Note   Patient Details  Name: Douglas Rose. MRN: [id number] Date of Birth: 09-28-1945  [facility]) CM/SW Contact:  Tom-Johnson, Renea Ee, RN Phone Number: 09/02/2021, 1:31 PM   Clinical Narrative:     Patient is scheduled for discharge today. Home health PT referral called in to [facility] per patient and wife's request and Tommi Rumps noted acceptance. Info on AVS.  From home with wife, has six adult children. Has a cane, shower seat and grab bars in the shower. PT recommended RW, ordered from [employer] and [place] to deliver at bedside. Eliquis booklet with 30 days coupon given to patient. Wife and daughter at bedside and will transport at discharge.  No further TOC needs noted.       Final next level of care: [place] Barriers to Discharge: Barriers Resolved   Patient Goals and CMS Choice Patient states their goals for this hospitalization and ongoing recovery are:: To return home CMS Medicare.gov Compare Post Acute Care list provided to:: Patient Choice offered to / list presented to : Patient, Spouse  Discharge Placement                Patient to be transferred to facility by: Family      Discharge Plan and Services                DME Arranged: Gilford Rile rolling DME Agency: [employer] Date DME Agency Contacted: 09/02/21 Time DME Agency Contacted: [phone] Representative spoke with at DME Agency: [place]: PT [place]: [place] Date [place]: 09/02/21 Time [place]: 30    Social Determinants of Health (SDOH) Interventions     Readmission Risk Interventions     No data to display

## 2021-09-02 NOTE — TOC Benefit Eligibility Note (Signed)
Transition of Care [facility]) Benefit Eligibility Note    Patient Details  Name: Mac Dowdell. MRN: [id number] Date of Birth: 1945-08-31   Medication/Dose: APIXABAN 2.5 MG BID   CO-PAY- $30.00  and   APIXABAN 5 MG BID  CO-PAY- $30.00  PRIOR APPROVAL- YES  Covered?: Yes  Tier: 3 Drug  Prescription Coverage Preferred Pharmacy: [facility]  and  [place] with Person/Company/Phone Number:: ZARISH  @ [employer] # [phone]  Co-Pay: $ 30.00  Prior Approval: Yes (# [phone])  Deductible: Met (OUT-OF-POCKET: MET , PT'S NOW IN [place] PHASE STAGE-4)  Additional Notes: RIVAROXABAN 15MG BID  CO-PAY- $ 60 .00  and  RIVAROXABAN  20 MG DAILY CO-PAY- $30.00  PRIOR APPROVAL-YES    Memory Argue Phone Number: 09/02/2021, 1:31 PM

## 2021-09-02 NOTE — Progress Notes (Addendum)
ANTICOAGULATION CONSULT NOTE  Pharmacy Consult for Heparin Indication: pulmonary embolus and DVT  No Known Allergies  Patient Measurements: Height: 5' 11"  (180.3 cm) Weight: 77 kg (169 lb 12.1 oz) IBW/kg (Calculated) : 75.3 Heparin Dosing Weight: 77 kg  Vital Signs: Temp: 98.1 F (36.7 C) (08/28 0754) Temp Source: Oral (08/28 0754) BP: 147/84 (08/28 0754) Pulse Rate: 87 (08/28 0754)  Labs: Recent Labs    08/31/21 2211 09/01/21 0046 09/01/21 0152 09/01/21 0344 09/01/21 1106 09/01/21 1842 09/02/21 0826  HGB 11.7*  --   --   --   --   --  9.9*  HCT 36.6*  --   --   --   --   --  29.9*  PLT 109*  --   --   --   --   --  111*  APTT  --   --  24  --   --   --   --   LABPROT  --   --  14.6  --   --   --   --   INR  --   --  1.2  --   --   --   --   HEPARINUNFRC  --   --   --    < > 0.41 0.33 0.23*  CREATININE 1.53*  --   --   --   --   --  1.61*  TROPONINIHS 34* 29*  --   --   --   --   --    < > = values in this interval not displayed.     Estimated Creatinine Clearance: 40.9 mL/min (A) (by C-G formula based on SCr of 1.61 mg/dL (H)).   Scheduled:   amLODipine  10 mg Oral Daily   aspirin EC  81 mg Oral Daily   atorvastatin  20 mg Oral Daily   carbamide peroxide  5 drop Left EAR BID   gabapentin  300 mg Oral Daily   insulin aspart  0-5 Units Subcutaneous QHS   insulin aspart  0-9 Units Subcutaneous TID WC   insulin glargine-yfgn  10 Units Subcutaneous Daily   lisinopril  2.5 mg Oral Daily   sodium chloride flush  3 mL Intravenous Q12H   Infusions:   heparin 1,350 Units/hr (09/02/21 0334)   PRN: acetaminophen **OR** acetaminophen, fentaNYL (SUBLIMAZE) injection, ondansetron **OR** ondansetron (ZOFRAN) IV, oxyCODONE, senna-docusate  Assessment: 51 yom with a history of multiple myeloma in remission, CKD, IDDM, HTN, CAD. Patient is presenting with onset of right-sided pleuritic chest pain. Heparin per pharmacy consult placed for pulmonary embolus and DVT.  -CT  PE w/ PE in rt middle/lower lobes - small clot burden CT evidence of RHS. [name] w/ lt DVT & possible for occlusion to mid-distal SFA -heparin level= 0.23 on 1350 units/hr, hg= 9.9 (down from 11.7 but baseline ~ 10-11) -apixaban cost ~ $30  Goal of Therapy:  Heparin level 0.3-0.7 units/ml Monitor platelets by anticoagulation protocol: Yes   Plan:  -Increase heparin to 1500 units/hr -Heparin level in 8 hours and daily wth CBC daily -Will follow oral anticoagulation plans  Hildred Laser, PharmD Clinical Pharmacist **Pharmacist phone directory can now be found on [email or web address] ([email or web address]).  Listed under [place].

## 2021-09-02 NOTE — Care Management Obs Status (Signed)
[facility] NOTIFICATION   Patient Details  Name: Douglas Rose. MRN: [id number] Date of Birth: Jun 25, 1945   Medicare Observation Status Notification Given:  Yes    Tom-Johnson, Renea Ee, RN 09/02/2021, 10:16 AM

## 2021-09-02 NOTE — Plan of Care (Signed)
  Problem: Fluid Volume: Goal: Ability to maintain a balanced intake and output will improve Outcome: Progressing   Problem: Health Behavior/Discharge Planning: Goal: Ability to manage health-related needs will improve Outcome: Progressing   Problem: Nutritional: Goal: Maintenance of adequate nutrition will improve Outcome: Progressing   Problem: Health Behavior/Discharge Planning: Goal: Ability to manage health-related needs will improve Outcome: Progressing   Problem: Clinical Measurements: Goal: Ability to maintain clinical measurements within normal limits will improve Outcome: Progressing Goal: Diagnostic test results will improve Outcome: Progressing Goal: Respiratory complications will improve Outcome: Progressing Goal: Cardiovascular complication will be avoided Outcome: Progressing   Problem: Coping: Goal: Level of anxiety will decrease Outcome: Progressing   Problem: Pain Managment: Goal: General experience of comfort will improve Outcome: Progressing   Problem: Consults Goal: Diabetes Guidelines if Diabetic/Glucose > 140 Description: If diabetic or lab glucose is > 140 mg/dl - Initiate Diabetes/Hyperglycemia Guidelines & Document Interventions  Outcome: Progressing   Problem: Nutrition: Goal: Adequate nutrition will be maintained Outcome: Completed/Met   Problem: Elimination: Goal: Will not experience complications related to urinary retention Outcome: Completed/Met   Problem: Consults Goal: Diagnosis - Venous Thromboembolism (VTE) Description: Choose a selection Outcome: Completed/Met Note: PE (Pulmonary Embolism) Goal: Pharmacy Consult for anticoagulation Outcome: Completed/Met   Problem: Phase I Progression Outcomes Goal: Pain controlled with appropriate interventions Outcome: Completed/Met Goal: Dyspnea controlled at rest (PE) Outcome: Completed/Met Goal: Tolerating diet Outcome: Completed/Met Goal: Initial discharge plan  identified Outcome: Completed/Met Goal: Voiding-avoid urinary catheter unless indicated Outcome: Completed/Met Goal: Hemodynamically stable Outcome: Completed/Met   Problem: Consults Goal: Skin Care Protocol Initiated - if Braden Score 18 or less Description: If consults are not indicated, leave blank or document N/A Outcome: Not Applicable Goal: Nutrition Consult-if indicated Outcome: Not Applicable

## 2021-09-02 NOTE — TOC Benefit Eligibility Note (Signed)
Patient Teacher, English as a foreign language completed.    The patient is currently admitted and upon discharge could be taking Eliquis 5 mg.  The current 30 day co-pay is $30.02.   The patient is currently admitted and upon discharge could be taking Xarelto 20 mg.  The current 30 day co-pay is $29.04.   The patient is insured through [place], [place] Patient Advocate Specialist [facility] Patient Advocate Team Direct Number: [phone]  Fax: [phone]

## 2021-09-02 NOTE — Evaluation (Signed)
Occupational Therapy Evaluation and Discharge Patient Details Name: Douglas Rose. MRN: [id number] DOB: 1945/05/11 Today's Date: 09/02/2021   History of Present Illness Pt is a 76 y/o male admitted secondary to R chest pain. Found to have PE. PMH includes DM, CKD, HTN, and multiple myeloma.   Clinical Impression   This 76 yo male admitted with above presents to acute OT with PLOF of being independent with basic ADLs. Currently he is setup/S-min A for basic ADLs from a RW level and sponge bath level (have recommended that they have HHPT do a "dry run" with him on stepping in and out of the tub. Wife will be with him 24/7 and can A prn. No further OT needs, we will sign off.      Recommendations for follow up therapy are one component of a multi-disciplinary discharge planning process, led by the attending physician.  Recommendations may be updated based on patient status, additional functional criteria and insurance authorization.   Follow Up Recommendations  No OT follow up    Assistance Recommended at Discharge Frequent or constant Supervision/Assistance  Patient can return home with the following A little help with bathing/dressing/bathroom;A little help with walking and/or transfers;Assistance with cooking/housework;Assist for transportation;Help with stairs or ramp for entrance;Direct supervision/assist for financial management;Direct supervision/assist for medications management    Functional Status Assessment  Patient has had a recent decline in their functional status and demonstrates the ability to make significant improvements in function in a reasonable and predictable amount of time. (without further skilled OT services needed)  Equipment Recommendations  None recommended by OT       Precautions / Restrictions Precautions Precautions: Fall Restrictions Weight Bearing Restrictions: No      Mobility Bed Mobility Overal bed mobility: Needs Assistance Bed Mobility:  Supine to Sit, Sit to Supine     Supine to sit: Min guard, HOB elevated (increased time) Sit to supine: Supervision (increased tim)        Transfers Overall transfer level: Needs assistance Equipment used: Rolling walker (2 wheels) Transfers: Sit to/from Stand Sit to Stand: Min assist                  Balance Overall balance assessment: Needs assistance Sitting-balance support: No upper extremity supported, Feet supported Sitting balance-Leahy Scale: Good     Standing balance support: Bilateral upper extremity supported, Reliant on assistive device for balance Standing balance-Leahy Scale: Poor Standing balance comment: Reliant on UE support; VCs for safe hand placement with RW                           ADL either performed or assessed with clinical judgement   ADL Overall ADL's : Needs assistance/impaired Eating/Feeding: Independent;Sitting   Grooming: Set up;Supervision/safety;Sitting   Upper Body Bathing: Set up;Supervision/ safety;Sitting   Lower Body Bathing: Minimal assistance;Sit to/from stand   Upper Body Dressing : Supervision/safety;Set up;Sitting   Lower Body Dressing: Minimal assistance;Sit to/from stand   Toilet Transfer: Minimal assistance;Rolling walker (2 wheels)   Toileting- Clothing Manipulation and Hygiene: Minimal assistance;Sit to/from Nurse, children's Details (indicate cue type and reason): we discussed having HHOT practice a "dry run" with pt stepping into his tub with grab bars to 3n1 (wife says it fits in their tub). Dtr to look into getting him a hand held shower head   General ADL Comments: issued pt a gait belt, gave pt energy conservation handout--discussed some of the strategies  with pt and family in room (wife and dtr)     Vision Baseline Vision/History: 1 Wears glasses Ability to See in Adequate Light: 0 Adequate Patient Visual Report: No change from baseline              Pertinent Vitals/Pain  Pain Assessment Pain Assessment: No/denies pain     Hand Dominance Right   Extremity/Trunk Assessment Upper Extremity Assessment Upper Extremity Assessment: Generalized weakness     Communication Communication Communication: No difficulties   Cognition Arousal/Alertness: Awake/alert Behavior During Therapy: WFL for tasks assessed/performed Overall Cognitive Status: Within Functional Limits for tasks assessed                                       General Comments  Pt's wife and daughter present            Home Living Family/patient expects to be discharged to:: Private residence Living Arrangements: Spouse/significant other Available Help at Discharge: Family;Available 24 hours/day Type of Home: House Home Access: Stairs to enter [employer] of Steps: 1 Entrance Stairs-Rails: None Home Layout: Multi-level Alternate Level Stairs-Number of Steps: 2 flights Alternate Level Stairs-Rails: Right Bathroom Shower/Tub: Tub/shower unit;Curtain   Biochemist, clinical: Standard     Home Equipment: Cane - single point;BSC/3in1          Prior Functioning/Environment Prior Level of Function : Independent/Modified Independent                        OT Problem List: Impaired balance (sitting and/or standing)         OT Goals(Current goals can be found in the care plan section) Acute Rehab OT Goals Patient Stated Goal: to go home today OT Goal Formulation: With patient/family         AM-PAC OT "6 Clicks" Daily Activity     Outcome Measure Help from another person eating meals?: None Help from another person taking care of personal grooming?: A Little Help from another person toileting, which includes using toliet, bedpan, or urinal?: A Little Help from another person bathing (including washing, rinsing, drying)?: A Little Help from another person to put on and taking off regular upper body clothing?: A Little Help from another person to  put on and taking off regular lower body clothing?: A Little 6 Click Score: 19   End of Session Equipment Utilized During Treatment: Rolling walker (2 wheels)  Activity Tolerance: Patient tolerated treatment well Patient left: in bed;with call bell/phone within reach;with family/visitor present  OT Visit Diagnosis: Unsteadiness on feet (R26.81);Other abnormalities of gait and mobility (R26.89);Muscle weakness (generalized) (M62.81)                Time: 1331-1400 OT Time Calculation (min): 29 min Charges:  OT General Charges $OT Visit: 1 Visit OT Evaluation $OT Eval Moderate Complexity: 1 Mod OT Treatments $Self Care/Home Management : 8-22 mins  Golden Circle, OTR/L Acute Rehab Services [facility] [phone] Office [phone]    Almon Register 09/02/2021, 2:15 PM

## 2021-09-02 NOTE — Hospital Course (Addendum)
76 year old male with a history of multiple myeloma in remission, CKD stage IIIa, diabetes mellitus type 2, hypertension, CAD presented to the ED with complaints of acute onset right-sided pleuritic chest pain.  In the ED CT of the chest abdomen/pelvis was notable for pulmonary embolism involving the right middle and right lower lobes with small overall clot burden with RV/LV ratio of 1.13.  Patient was started on IV heparin in the ED. patient was admitted.  Echocardiogram shows EF 60 to 60% G1 DD normal RV function and size.  Also found to have DVT of left posterior tibial with patient was complaining of claudication symptoms seen by vascular surgery felt nonurgent and planning for outpatient follow-up in 3 to 6 months, patient changed to Eliquis, tolerating well at this time he is able to come off oxygen ambulating well . he remains medically stable for discharge home.

## 2021-09-02 NOTE — Evaluation (Signed)
Physical Therapy Evaluation Patient Details Name: Douglas Rose. MRN: [id number] DOB: 1945/08/11 Today's Date: 09/02/2021  History of Present Illness  Pt is a 76 y/o male admitted secondary to R chest pain. Found to have PE. PMH includes DM, CKD, HTN, and multiple myeloma.  Clinical Impression  Pt admitted secondary to problem above with deficits below. Pt requiring min guard A for mobility tasks using RW this session. No overt LOB noted. Educated about using RW at home for increased safety. Recommending HHPT at d/c to address current deficits. Will continue to follow acutely.        Recommendations for follow up therapy are one component of a multi-disciplinary discharge planning process, led by the attending physician.  Recommendations may be updated based on patient status, additional functional criteria and insurance authorization.  Follow Up Recommendations Home health PT      Assistance Recommended at Discharge Frequent or constant Supervision/Assistance  Patient can return home with the following  A little help with walking and/or transfers;A little help with bathing/dressing/bathroom;Assist for transportation;Help with stairs or ramp for entrance;Assistance with cooking/housework    Equipment Recommendations Rolling walker (2 wheels)  Recommendations for Other Services       Functional Status Assessment Patient has had a recent decline in their functional status and demonstrates the ability to make significant improvements in function in a reasonable and predictable amount of time.     Precautions / Restrictions Precautions Precautions: Fall Restrictions Weight Bearing Restrictions: No      Mobility  Bed Mobility Overal bed mobility: Needs Assistance Bed Mobility: Supine to Sit, Sit to Supine     Supine to sit: Min assist Sit to supine: Supervision   General bed mobility comments: Min A for trunk elevation.    Transfers Overall transfer level: Needs  assistance Equipment used: Rolling walker (2 wheels) Transfers: Sit to/from Stand Sit to Stand: Min guard           General transfer comment: Min guard for safety    Ambulation/Gait Ambulation/Gait assistance: Min guard Gait Distance (Feet): 75 Feet Assistive device: Rolling walker (2 wheels) Gait Pattern/deviations: Step-through pattern, Decreased stride length, Shuffle Gait velocity: Decreased     General Gait Details: Min guard for safety. Cues for proximity to device and upright posture. Cues for increased step length  Stairs            Wheelchair Mobility    Modified Rankin (Stroke Patients Only)       Balance Overall balance assessment: Needs assistance Sitting-balance support: No upper extremity supported, Feet supported Sitting balance-Leahy Scale: Good     Standing balance support: Bilateral upper extremity supported Standing balance-Leahy Scale: Poor Standing balance comment: Reliant on UE support                             Pertinent Vitals/Pain Pain Assessment Pain Assessment: Faces Faces Pain Scale: Hurts little more Pain Location: R chest Pain Descriptors / Indicators: Grimacing, Guarding Pain Intervention(s): Limited activity within patient's tolerance, Monitored during session, Repositioned    Home Living Family/patient expects to be discharged to:: Private residence Living Arrangements: Spouse/significant other Available Help at Discharge: Family Type of Home: House Home Access: Stairs to enter Entrance Stairs-Rails: None Entrance Stairs-Number of Steps: 1 Alternate Level Stairs-Number of Steps: 2 flights Home Layout: Multi-level Home Equipment: Cane - single point;BSC/3in1      Prior Function Prior Level of Function : Independent/Modified Independent  Hand Dominance        Extremity/Trunk Assessment   Upper Extremity Assessment Upper Extremity Assessment: Defer to OT evaluation     Lower Extremity Assessment Lower Extremity Assessment: Generalized weakness    Cervical / Trunk Assessment Cervical / Trunk Assessment: Kyphotic  Communication   Communication: No difficulties  Cognition Arousal/Alertness: Awake/alert Behavior During Therapy: WFL for tasks assessed/performed Overall Cognitive Status: Within Functional Limits for tasks assessed                                          General Comments General comments (skin integrity, edema, etc.): Pt's wife and daughter present    Exercises     Assessment/Plan    PT Assessment Patient needs continued PT services  PT Problem List Decreased strength;Decreased activity tolerance;Decreased balance;Decreased mobility;Decreased knowledge of use of DME;Decreased knowledge of precautions       PT Treatment Interventions DME instruction;Gait training;Stair training;Functional mobility training;Therapeutic activities;Therapeutic exercise;Balance training;Patient/family education    PT Goals (Current goals can be found in the Care Plan section)  Acute Rehab PT Goals Patient Stated Goal: to go home PT Goal Formulation: With patient Time For Goal Achievement: 09/16/21 Potential to Achieve Goals: Good    Frequency Min 3X/week     Co-evaluation               AM-PAC PT "6 Clicks" Mobility  Outcome Measure Help needed turning from your back to your side while in a flat bed without using bedrails?: A Little Help needed moving from lying on your back to sitting on the side of a flat bed without using bedrails?: A Little Help needed moving to and from a bed to a chair (including a wheelchair)?: A Little Help needed standing up from a chair using your arms (e.g., wheelchair or bedside chair)?: A Little Help needed to walk in hospital room?: A Little Help needed climbing 3-5 steps with a railing? : A Lot 6 Click Score: 17    End of Session Equipment Utilized During Treatment: Gait  belt Activity Tolerance: Patient tolerated treatment well Patient left: in bed;with call bell/phone within reach;with family/visitor present Nurse Communication: Mobility status PT Visit Diagnosis: Unsteadiness on feet (R26.81);Muscle weakness (generalized) (M62.81);Difficulty in walking, not elsewhere classified (R26.2)    Time: 0998-3382 PT Time Calculation (min) (ACUTE ONLY): 24 min   Charges:   PT Evaluation $PT Eval Low Complexity: 1 Low PT Treatments $Gait Training: 8-22 mins        Lou Miner, DPT  Acute Rehabilitation Services  Office: [phone]   Rudean Hitt 09/02/2021, 1:17 PM

## 2021-09-02 NOTE — Discharge Summary (Signed)
Physician Discharge Summary  Douglas Rose. HWE:[id number] DOB: 1945/03/15 DOA: 08/31/2021  PCP: [name], MD  Admit date: 08/31/2021 Discharge date: 09/02/2021 Recommendations for Outpatient Follow-up:  Follow up with PCP in 1 weeks-call for appointment Follow with [name] in a week. Please obtain BMP/CBC in one week  Discharge Dispo: hh Discharge Condition: Stable Code Status:   Code Status: Full Code Diet recommendation:  Diet Order             Diet regular Room service appropriate? Yes; Fluid consistency: Thin  Diet effective now                 Brief/Interim Summary:76 year old male with a history of multiple myeloma in remission, CKD stage IIIa, diabetes mellitus type 2, hypertension, CAD presented to the ED with complaints of acute onset right-sided pleuritic chest pain.  In the ED CT of the chest abdomen/pelvis was notable for pulmonary embolism involving the right middle and right lower lobes with small overall clot burden with RV/LV ratio of 1.13.  Patient was started on IV heparin in the ED. patient was admitted.  Echocardiogram shows EF 60 to 60% G1 DD normal RV function and size.  Also found to have DVT of left posterior tibial with patient was complaining of claudication symptoms seen by vascular surgery felt nonurgent and planning for outpatient follow-up in 3 to 6 months, patient changed to Eliquis, tolerating well at this time he is able to come off oxygen ambulating well . he remains medically stable for discharge home.   Discharge Diagnoses:  Principal Problem:   Pulmonary embolism (HCC) Active Problems:   Type 2 diabetes mellitus (HCC)   Stage 3 chronic kidney disease (HCC)   Benign hypertension   Multiple myeloma in remission ([place])   Fever   Thrombocytopenia (HCC)  Pulmonary embolism with right heart strain in the CT DVT left posterior tibial vein: Treated with heparin infusion.  Echo reassuring no evidence of right heart strain.  Mobilize  transitioned to DOAC-tolerating well.  Patient will be discharged home later today.  PVD: Seen by vascular no urgent need for intervention plan for outpatient follow-up continue aspirin and anticoagulation  Multiple myeloma on lenalidomide at home hold hold for now-discussed with [name], he advised to hold it until patient is seen in the clinic. Thrombocytopenia in the setting of myeloma monitor. Hypertension on Norvasc lisinopril. CKD stage IIIa baseline around 1.12.  Consults: Vascular surgery, [name] over the secure chat  Subjective: Alert awake oriented ambulated this morning without any problems.  Able to come off oxygen.  Feels well no shortness of breath or chest pain  Discharge Exam: Vitals:   09/02/21 0457 09/02/21 0754  BP: (!) 164/87 (!) 147/84  Pulse: 83 87  Resp: 20 18  Temp: 99.2 F (37.3 C) 98.1 F (36.7 C)  SpO2: 97% 98%   General: Pt is alert, awake, not in acute distress Cardiovascular: RRR, S1/S2 +, no rubs, no gallops Respiratory: CTA bilaterally, no wheezing, no rhonchi Abdominal: Soft, NT, ND, bowel sounds + Extremities: no edema, no cyanosis  Discharge Instructions  Discharge Instructions     Discharge instructions   Complete by: As directed    Please call call MD or return to ER for similar or worsening recurring problem that brought you to hospital or if any fever,nausea/vomiting,abdominal pain, uncontrolled pain, chest pain,  shortness of breath or any other alarming symptoms.  Please follow-up your doctor as instructed in a week time and call the  office for appointment.  Please avoid alcohol, smoking, or any other illicit substance and maintain healthy habits including taking your regular medications as prescribed.  You were cared for by a hospitalist during your hospital stay. If you have any questions about your discharge medications or the care you received while you were in the hospital after you are discharged, you can call the unit and  ask to speak with the hospitalist on call if the hospitalist that took care of you is not available.  Once you are discharged, your primary care physician will handle any further medical issues. Please note that NO REFILLS for any discharge medications will be authorized once you are discharged, as it is imperative that you return to your primary care physician (or establish a relationship with a primary care physician if you do not have one) for your aftercare needs so that they can reassess your need for medications and monitor your lab values   Increase activity slowly   Complete by: As directed       Allergies as of 09/02/2021   No Known Allergies      Medication List     STOP taking these medications    lenalidomide 10 MG capsule Commonly known as: REVLIMID       TAKE these medications    amLODipine 10 MG tablet Commonly known as: NORVASC Take 10 mg by mouth daily.   Apixaban Starter Pack (41m and 554m Commonly known as: ELIQUIS STARTER PACK Take as directed on package: first dose done 8/28 am: Take two-53m14mablets twice daily for 7 days. On day 8, switch to one-53mg49mblet twice daily.   aspirin 81 MG tablet Take 81 mg by mouth daily.   atorvastatin 20 MG tablet Commonly known as: LIPITOR Take 1 tablet (20 mg total) by mouth daily.   Cholecalciferol 10 MCG (400 UNIT) Caps Take 1 capsule by mouth daily.   cyanocobalamin 1000 MCG tablet Commonly known as: VITAMIN B12 Take 1,000 mcg by mouth daily.   gabapentin 300 MG capsule Commonly known as: NEURONTIN Take 300 mg by mouth daily.   Lantus SoloStar 100 UNIT/ML Solostar Pen Generic drug: insulin glargine Inject 16 Units into the skin daily.   lisinopril 2.5 MG tablet Commonly known as: ZESTRIL Take 2.5 mg by mouth daily.   metoprolol succinate 100 MG 24 hr tablet Commonly known as: TOPROL-XL Take 100 mg by mouth daily.   nitroGLYCERIN 0.4 MG SL tablet Commonly known as: NITROSTAT Place 1 tablet (0.4  mg total) under the tongue every 5 (five) minutes x 3 doses as needed for chest pain.               Durable Medical Equipment  (From admission, onward)           Start     Ordered   09/02/21 1252  For home use only DME Walker rolling  Once       Question Answer Comment  Walker: With 5 Inch Wheels   Patient needs a walker to treat with the following condition Weakness   Patient needs a walker to treat with the following condition Balance problem      09/02/21 1251            Follow-up Information     BrisKatherina Mires Follow up in 1 week(s).   Specialty: Family Medicine Contact information: [phone]     KaleSullivan [place]  MD Follow up in 3 day(s).   Specialties: Hematology, Oncology Contact information: [place] [address] [phone]                No Known Allergies  The results of significant diagnostics from this hospitalization (including imaging, microbiology, ancillary and laboratory) are listed below for reference.    Microbiology: No results found for this or any previous visit (from the past 240 hour(s)).  Procedures/Studies: VAS [name] LOWER EXTREMITY VENOUS (DVT)  Result Date: 09/01/2021  Lower Venous DVT Study Patient Name:  Xayden Linsey.  Date of Exam:   09/01/2021 Medical Rec #: [id number]           Accession #:    [id number] Date of Birth: June 22, 1945          Patient Gender: M Patient Age:   46 years Exam Location:  [facility] Procedure:      VAS [name] LOWER EXTREMITY VENOUS (DVT) Referring Phys: TIMOTHY OPYD --------------------------------------------------------------------------------  Indications: Pulmonary embolism.  Risk Factors: Cancer Myeloma. Comparison Study: Prior negative right LEV done 08/12/18 Performing Technologist: Sharion Dove RVS  Examination Guidelines: A complete evaluation includes B-mode imaging, spectral  Doppler, color Doppler, and power Doppler as needed of all accessible portions of each vessel. Bilateral testing is considered an integral part of a complete examination. Limited examinations for reoccurring indications may be performed as noted. The reflux portion of the exam is performed with the patient in reverse Trendelenburg.  +---------+---------------+---------+-----------+----------+--------------+ RIGHT    CompressibilityPhasicitySpontaneityPropertiesThrombus Aging +---------+---------------+---------+-----------+----------+--------------+ CFV      Full           Yes      Yes                                 +---------+---------------+---------+-----------+----------+--------------+ SFJ      Full                                                        +---------+---------------+---------+-----------+----------+--------------+ FV Prox  Full                                                        +---------+---------------+---------+-----------+----------+--------------+ FV Mid   Full                                                        +---------+---------------+---------+-----------+----------+--------------+ FV DistalFull                                                        +---------+---------------+---------+-----------+----------+--------------+ PFV      Full                                                        +---------+---------------+---------+-----------+----------+--------------+  POP      Full           Yes      Yes                                 +---------+---------------+---------+-----------+----------+--------------+ PTV      Full                                                        +---------+---------------+---------+-----------+----------+--------------+ PERO     Full                                                        +---------+---------------+---------+-----------+----------+--------------+    +---------+---------------+---------+-----------+----------+--------------+ LEFT     CompressibilityPhasicitySpontaneityPropertiesThrombus Aging +---------+---------------+---------+-----------+----------+--------------+ CFV      Full           Yes      Yes                                 +---------+---------------+---------+-----------+----------+--------------+ SFJ      Full                                                        +---------+---------------+---------+-----------+----------+--------------+ FV Prox  Full                                                        +---------+---------------+---------+-----------+----------+--------------+ FV Mid   Full                                                        +---------+---------------+---------+-----------+----------+--------------+ FV DistalFull                                                        +---------+---------------+---------+-----------+----------+--------------+ PFV      Full                                                        +---------+---------------+---------+-----------+----------+--------------+ POP      Full           Yes      Yes                                 +---------+---------------+---------+-----------+----------+--------------+  PTV      None                                         Acute          +---------+---------------+---------+-----------+----------+--------------+ PERO     Full                                                        +---------+---------------+---------+-----------+----------+--------------+     Summary: RIGHT: - There is no evidence of deep vein thrombosis in the lower extremity.  - No cystic structure found in the popliteal fossa.  LEFT: - Findings consistent with acute deep vein thrombosis involving the left posterior tibial veins. - No cystic structure found in the popliteal fossa.  - Incidentally, patient complained of left  claudication symptoms and stated how his home health nurse stated he had "blockages" and is being referred to a neurologist. During the venous study, the tech noted what appears to be an occlusion in the mid to distal SFA with reconstitution noted in the distal SFA.  *See table(s) above for measurements and observations.  Vascular consult recommended. Electronically signed by Harold Barban MD on 09/01/2021 at 8:48:50 PM.    Final    ECHOCARDIOGRAM COMPLETE  Result Date: 09/01/2021    ECHOCARDIOGRAM REPORT   Patient Name:   Sajad Glander. Date of Exam: 09/01/2021 Medical Rec #:  [id number]          Height:       71.0 in Accession #:    [id number]         Weight:       171.0 lb Date of Birth:  [birth year]         BSA:          1.973 m Patient Age:    46 years           BP:           132/65 mmHg Patient Gender: M                  HR:           79 bpm. Exam Location:  Inpatient Procedure: 2D Echo Indications:    pulmonary embolus  History:        Patient has prior history of Echocardiogram examinations, most                 recent 11/09/2019. CAD, chronic kidney disease; Risk                 Factors:Diabetes, Hypertension and Dyslipidemia.  Sonographer:    Johny Chess RDCS Referring Phys: [id number] [place]  1. Left ventricular ejection fraction, by estimation, is 60 to 65%. Left ventricular ejection fraction by PLAX is 60 %. The left ventricle has normal function. The left ventricle has no regional wall motion abnormalities. There is moderate concentric left ventricular hypertrophy. Left ventricular diastolic parameters are consistent with Grade I diastolic dysfunction (impaired relaxation).  2. Right ventricular systolic function is normal. The right ventricular size is normal.  3. The mitral valve is abnormal. Trivial mitral valve regurgitation.  4. The aortic valve is tricuspid. Aortic valve regurgitation is not visualized.  5.  The inferior vena cava is normal in size with greater than 50%  respiratory variability, suggesting right atrial pressure of 3 mmHg. Comparison(s): No significant change from prior study. 11/09/2019: LVEF 60-65%. FINDINGS  Left Ventricle: Left ventricular ejection fraction, by estimation, is 60 to 65%. Left ventricular ejection fraction by PLAX is 60 %. The left ventricle has normal function. The left ventricle has no regional wall motion abnormalities. The left ventricular internal cavity size was normal in size. There is moderate concentric left ventricular hypertrophy. Left ventricular diastolic parameters are consistent with Grade I diastolic dysfunction (impaired relaxation). Indeterminate filling pressures. Right Ventricle: The right ventricular size is normal. No increase in right ventricular wall thickness. Right ventricular systolic function is normal. Left Atrium: Left atrial size was normal in size. Right Atrium: Right atrial size was normal in size. Pericardium: Trivial pericardial effusion is present. The pericardial effusion is posterior to the left ventricle. Mitral Valve: The mitral valve is abnormal. There is mild thickening of the mitral valve leaflet(s). Mild mitral annular calcification. Trivial mitral valve regurgitation. Tricuspid Valve: The tricuspid valve is grossly normal. Tricuspid valve regurgitation is not demonstrated. Aortic Valve: The aortic valve is tricuspid. Aortic valve regurgitation is not visualized. Pulmonic Valve: The pulmonic valve was normal in structure. Pulmonic valve regurgitation is not visualized. Aorta: The aortic root and ascending aorta are structurally normal, with no evidence of dilitation. Venous: The inferior vena cava is normal in size with greater than 50% respiratory variability, suggesting right atrial pressure of 3 mmHg. IAS/Shunts: No atrial level shunt detected by color flow Doppler.  LEFT VENTRICLE PLAX 2D LV EF:         Left            Diastology                ventricular     LV e' medial:    5.11 cm/s                 ejection        LV E/e' medial:  14.4                fraction by     LV e' lateral:   6.09 cm/s                PLAX is 60      LV E/e' lateral: 12.1                %. LVIDd:         4.10 cm LVIDs:         2.80 cm LV PW:         1.00 cm LV IVS:        1.30 cm LVOT diam:     2.20 cm LV SV:         60 LV SV Index:   30 LVOT Area:     3.80 cm  LV Volumes (MOD) LV vol d, MOD    72.3 ml A2C: LV vol s, MOD    34.1 ml A2C: LV SV MOD A2C:   38.2 ml RIGHT VENTRICLE RV S prime:     16.30 cm/s TAPSE (M-mode): 2.8 cm LEFT ATRIUM             Index        RIGHT ATRIUM           Index LA diam:        4.10 cm 2.08 cm/m  RA Area:     10.80 cm LA Vol (A2C):   56.7 ml 28.74 ml/m  RA Volume:   21.50 ml  10.90 ml/m LA Vol (A4C):   61.0 ml 30.92 ml/m LA Biplane Vol: 58.7 ml 29.76 ml/m  AORTIC VALVE             PULMONIC VALVE LVOT Vmax:   83.10 cm/s  PR End Diast Vel: 9.86 msec LVOT Vmean:  54.700 cm/s LVOT VTI:    0.158 m  AORTA Ao Root diam: 3.40 cm Ao Asc diam:  3.40 cm MITRAL VALVE MV Area (PHT): 3.77 cm     SHUNTS MV Decel Time: 201 msec     Systemic VTI:  0.16 m MV E velocity: 73.70 cm/s   Systemic Diam: 2.20 cm MV A velocity: 124.00 cm/s MV E/A ratio:  0.59 Lyman Bishop MD Electronically signed by Lyman Bishop MD Signature Date/Time: 09/01/2021/11:59:40 AM    Final    CT Angio Chest/Abd/Pel for Dissection W and/or Wo Contrast  Result Date: 09/01/2021 CLINICAL DATA:  Chest/abdominal pain, elevated troponin, evaluate for dissection EXAM: CT ANGIOGRAPHY CHEST, ABDOMEN AND PELVIS TECHNIQUE: Non-contrast CT of the chest was initially obtained. Multidetector CT imaging through the chest, abdomen and pelvis was performed using the standard protocol during bolus administration of intravenous contrast. Multiplanar reconstructed images and MIPs were obtained and reviewed to evaluate the vascular anatomy. RADIATION DOSE REDUCTION: This exam was performed according to the departmental dose-optimization program which includes  automated exposure control, adjustment of the mA and/or kV according to patient size and/or use of iterative reconstruction technique. CONTRAST:  24m OMNIPAQUE IOHEXOL 350 MG/ML SOLN COMPARISON:  Chest radiograph dated 08/31/2024. CT abdomen/pelvis dated 07/08/2021. FINDINGS: CTA CHEST FINDINGS Cardiovascular: On unenhanced CT, there is no evidence of intramural hematoma. Preferential opacification of the thoracic aorta. No evidence of thoracic aortic aneurysm or dissection. Atherosclerotic calcifications of the aortic arch. Although not tailored for evaluation of the pulmonary arteries, there are lobar/segmental pulmonary emboli in the right middle and lower lobes (series 6/images 76, 79, and 89). Overall clot burden is small. Cardiomegaly. Elevated RV to LV ratio (1.13), raising the possibility of right heart strain. No pericardial effusion. Three vessel coronary atherosclerosis. Mediastinum/Nodes: No suspicious mediastinal lymphadenopathy. Visualized thyroid is unremarkable. Lungs/Pleura: Mild eventration of the right hemidiaphragm with associated patchy right middle and lower lobe opacities, likely atelectasis. However, developing pulmonary infarct in the right lower lobe is suspected (series 8/image 96). No suspicious pulmonary nodules. No pleural effusion or pneumothorax. Musculoskeletal: Visualized osseous structures are within normal limits. Review of the MIP images confirms the above findings. CTA ABDOMEN AND PELVIS FINDINGS VASCULAR Aorta: Patent.  Atherosclerotic calcifications. Celiac: Patent. SMA: Patent. Renals: Patent bilaterally.  Atherosclerotic calcifications. IMA: Patent. Inflow: Patent bilaterally.  Atherosclerotic calcifications. Veins: Unremarkable. Review of the MIP images confirms the above findings. NON-VASCULAR Hepatobiliary: Liver is within normal limits. Gallbladder is unremarkable. No intrahepatic or extrahepatic ductal dilatation. Pancreas: Within normal limits. Spleen: Within normal  limits. Adrenals/Urinary Tract: Adrenal glands are within normal limits. Right kidney is within normal limits. 10 mm simple left lower pole renal cyst (series 6/image 169), benign (Bosniak I). No follow-up is recommended. Bladder is underdistended but unremarkable. Stomach/Bowel: Stomach is notable for a tiny hiatal hernia. No evidence of bowel obstruction. Normal appendix (series 6/image 229). No colonic wall thickening or inflammatory changes. Lymphatic: No suspicious abdominopelvic lymphadenopathy. Reproductive: Prostatomegaly, suggesting BPH. Other: No abdominopelvic ascites. Musculoskeletal: Mild degenerative changes at L3-4. Review of the MIP images confirms  the above findings. IMPRESSION: No evidence of thoracoabdominal aortic aneurysm or dissection. Lobar/segmental pulmonary emboli in the right middle and lower lobes. Overall clot burden is small. Positive for acute PE with CT evidence of right heart strain (RV/LV Ratio = 1.13) consistent with at least submassive (intermediate risk) PE. The presence of right heart strain has been associated with an increased risk of morbidity and mortality. Please refer to the "Code PE Focused" order set in EPIC. Suspected developing pulmonary infarct in the right lower lobe. Critical Value/emergent results were called by telephone at the time of interpretation on 09/01/2021 at 1:15 am to provider Dr Betsey Holiday, who verbally acknowledged these results. Electronically Signed   By: Julian Hy M.D.   On: 09/01/2021 01:16   DG Chest 2 View  Result Date: 08/31/2021 CLINICAL DATA:  Chest pain EXAM: CHEST - 2 VIEW COMPARISON:  07/17/2021 FINDINGS: Right basilar airspace opacity could reflect atelectasis or infiltrate. Left lung clear. Heart is normal size. No effusions. No acute bony abnormality. IMPRESSION: Right base atelectasis or infiltrate. Electronically Signed   By: Rolm Baptise M.D.   On: 08/31/2021 22:56    Labs: BNP (last 3 results) Recent Labs     09/01/21 0152  BNP 098.1*   Basic Metabolic Panel: Recent Labs  Lab 08/31/21 2211 09/02/21 0826  NA 136 132*  K 3.7 3.3*  CL 104 101  CO2 22 24  GLUCOSE 120* 175*  BUN 18 16  CREATININE 1.53* 1.61*  CALCIUM 8.7* 7.9*   Liver Function Tests: Recent Labs  Lab 09/01/21 0345  AST 18  ALT 19  ALKPHOS 78  BILITOT 1.0  PROT 7.5  ALBUMIN 3.2*   No results for input(s): "LIPASE", "AMYLASE" in the last 168 hours. No results for input(s): "AMMONIA" in the last 168 hours. CBC: Recent Labs  Lab 08/31/21 2211 09/02/21 0826  WBC 5.6 4.6  HGB 11.7* 9.9*  HCT 36.6* 29.9*  MCV 89.1 86.9  PLT 109* 111*   Cardiac Enzymes: No results for input(s): "CKTOTAL", "CKMB", "CKMBINDEX", "TROPONINI" in the last 168 hours. BNP: Invalid input(s): "POCBNP" CBG: Recent Labs  Lab 09/01/21 0815 09/01/21 1152 09/01/21 1822 09/02/21 0854 09/02/21 1138  GLUCAP 122* 155* 176* 158* 201*   D-Dimer No results for input(s): "DDIMER" in the last 72 hours. Hgb A1c No results for input(s): "HGBA1C" in the last 72 hours. Lipid Profile No results for input(s): "CHOL", "HDL", "LDLCALC", "TRIG", "CHOLHDL", "LDLDIRECT" in the last 72 hours. Thyroid function studies No results for input(s): "TSH", "T4TOTAL", "T3FREE", "THYROIDAB" in the last 72 hours.  Invalid input(s): "FREET3" Anemia work up No results for input(s): "VITAMINB12", "FOLATE", "FERRITIN", "TIBC", "IRON", "RETICCTPCT" in the last 72 hours. Urinalysis    Component Value Date/Time   COLORURINE AMBER (A) 05/17/2016 1736   APPEARANCEUR HAZY (A) 05/17/2016 1736   LABSPEC 1.012 05/17/2016 1736   PHURINE 5.0 05/17/2016 1736   GLUCOSEU NEGATIVE 05/17/2016 1736   HGBUR MODERATE (A) 05/17/2016 1736   BILIRUBINUR NEGATIVE 05/17/2016 1736   KETONESUR NEGATIVE 05/17/2016 1736   PROTEINUR NEGATIVE 05/17/2016 1736   NITRITE NEGATIVE 05/17/2016 1736   LEUKOCYTESUR TRACE (A) 05/17/2016 1736   Sepsis Labs Recent Labs  Lab 08/31/21 2211  09/02/21 0826  WBC 5.6 4.6   Microbiology No results found for this or any previous visit (from the past 240 hour(s)).   Time coordinating discharge: 25 minutes  SIGNED: Antonieta Pert, MD  [facility] 09/02/2021, 1:06 PM  If 7PM-7AM, please contact night-coverage [email or web address]

## 2021-09-02 NOTE — Progress Notes (Signed)
SATURATION QUALIFICATIONS: (This note is used to comply with regulatory documentation for home oxygen)  Patient Saturations on Room Air at Rest = 98%  Patient Saturations on Room Air while Ambulating = 93%   Please briefly explain why patient needs home oxygen: Pt did not require supplemental oxygen to maintain adequate oxygen sats.   Reuel Derby, PT, DPT  Acute Rehabilitation Services  Office: [phone]

## 2021-09-02 NOTE — Telephone Encounter (Signed)
Pharmacy Patient Advocate Encounter  Insurance verification completed.    The patient is insured through [employer] Part D   The patient is currently admitted and ran test claims for the following: Eliquis, Xarelto.  Copays and coinsurance results were relayed to Inpatient clinical team.      

## 2021-09-02 NOTE — Progress Notes (Signed)
ANTICOAGULATION CONSULT NOTE  Pharmacy Consult for Heparin Indication: pulmonary embolus and DVT  No Known Allergies  Patient Measurements: Height: 5' 11"  (180.3 cm) Weight: 77 kg (169 lb 12.1 oz) IBW/kg (Calculated) : 75.3 Heparin Dosing Weight: 77 kg  Vital Signs: Temp: 98.1 F (36.7 C) (08/28 0754) Temp Source: Oral (08/28 0754) BP: 147/84 (08/28 0754) Pulse Rate: 87 (08/28 0754)  Labs: Recent Labs    08/31/21 2211 09/01/21 0046 09/01/21 0152 09/01/21 0344 09/01/21 1106 09/01/21 1842 09/02/21 0826  HGB 11.7*  --   --   --   --   --  9.9*  HCT 36.6*  --   --   --   --   --  29.9*  PLT 109*  --   --   --   --   --  111*  APTT  --   --  24  --   --   --   --   LABPROT  --   --  14.6  --   --   --   --   INR  --   --  1.2  --   --   --   --   HEPARINUNFRC  --   --   --    < > 0.41 0.33 0.23*  CREATININE 1.53*  --   --   --   --   --  1.61*  TROPONINIHS 34* 29*  --   --   --   --   --    < > = values in this interval not displayed.     Estimated Creatinine Clearance: 40.9 mL/min (A) (by C-G formula based on SCr of 1.61 mg/dL (H)).   Scheduled:   amLODipine  10 mg Oral Daily   apixaban  10 mg Oral BID   Followed by   Derrill Memo ON 09/09/2021] apixaban  5 mg Oral BID   aspirin EC  81 mg Oral Daily   atorvastatin  20 mg Oral Daily   carbamide peroxide  5 drop Left EAR BID   [START ON 09/03/2021] ferrous sulfate  325 mg Oral Q breakfast   gabapentin  300 mg Oral Daily   insulin aspart  0-5 Units Subcutaneous QHS   insulin aspart  0-9 Units Subcutaneous TID WC   insulin glargine-yfgn  10 Units Subcutaneous Daily   lisinopril  2.5 mg Oral Daily   sodium chloride flush  3 mL Intravenous Q12H   Infusions:    PRN: acetaminophen **OR** acetaminophen, ondansetron **OR** ondansetron (ZOFRAN) IV, oxyCODONE, senna-docusate  Assessment: 17 yom with a history of multiple myeloma in remission, CKD, IDDM, HTN, CAD. Patient is presenting with onset of right-sided pleuritic  chest pain. Heparin per pharmacy consult placed for pulmonary embolus and DVT.  -CT PE w/ PE in rt middle/lower lobes - small clot burden CT evidence of RHS. [name] w/ lt DVT & possible for occlusion to mid-distal SFA -heparin level= 0.23 on 1350 units/hr, hg= 9.9 (down from 11.7 but baseline ~ 10-11) -apixaban cost ~ $30  Goal of Therapy:  Heparin level 0.3-0.7 units/ml Monitor platelets by anticoagulation protocol: Yes   Plan:  -Increase heparin to 1500 units/hr -Heparin level in 8 hours and daily wth CBC daily -Will follow oral anticoagulation plans  Hildred Laser, PharmD Clinical Pharmacist **Pharmacist phone directory can now be found on [email or web address] ([email or web address]).  Listed under [place].  Addendum -plans to transition to apixaban  Plan -apixaban 44m po bid for 7 days then  44m po bid -discontinue heparin   AHildred Laser PharmD Clinical Pharmacist **Pharmacist phone directory can now be found on [email or web address] ([email or web address]).  Listed under [place]

## 2021-09-03 NOTE — Progress Notes (Signed)
Per recent hospital d/c instructions pt is to hold revlimid . Dr Irene Limbo aware.  Pt and wife understand to hold revlimid until they have a phone appointment with Dr Irene Limbo on 09/20/21 to discuss.

## 2021-09-04 ENCOUNTER — Other Ambulatory Visit: Payer: Self-pay

## 2021-09-04 DIAGNOSIS — C9000 Multiple myeloma not having achieved remission: Secondary | ICD-10-CM

## 2021-09-04 MED ORDER — LENALIDOMIDE 10 MG PO CAPS: 10.0000 mg | ORAL_CAPSULE | Freq: Every day | ORAL | 0 refills | Status: DC

## 2021-09-20 ENCOUNTER — Inpatient Hospital Stay: Payer: Medicare HMO | Attending: Hematology | Admitting: Hematology

## 2021-09-20 DIAGNOSIS — C9000 Multiple myeloma not having achieved remission: Secondary | ICD-10-CM

## 2021-09-20 MED ORDER — APIXABAN 5 MG PO TABS
5.0000 mg | ORAL_TABLET | Freq: Two times a day (BID) | ORAL | 2 refills | Status: DC
Start: 2021-09-20 — End: 2021-12-20

## 2021-09-27 NOTE — Progress Notes (Addendum)
Douglas Rose  HEMATOLOGY ONCOLOGY PHONE VISIT NOTE  Date of service: .09/20/2021   Patient Care Team: [name], MD as PCP - General (Family Medicine) Brunetta Genera, MD as Consulting Physician (Hematology)  Chief Complaint : Follow-up for continued valuation and management of multiple myeloma  Diagnosis:  IgG Kappa multiple myeloma    Current Treatment:  Revlimid maintenance   INTERVAL HISTORY:  .Douglas KitchenI connected with [name]. on 09/20/2021 at  3:30 PM EDT by telephone visit and verified that I am speaking with the correct person using two identifiers.   I discussed the limitations, risks, security and privacy concerns of performing an evaluation and management service by telemedicine and the availability of in-person appointments. I also discussed with the patient that there may be a patient responsible charge related to this service. The patient expressed understanding and agreed to proceed.   Other persons participating in the visit and their role in the encounter: -Patient's wife  Patient's location: Home Provider's location: [place]  Chief Complaint: Follow-up for multiple myeloma.  After recent hospitalization.  Patient was recently admitted to hospital and had a CT angio of the chest abdomen pelvis which showed pulmonary emboli in the right middle and lower lobes of the lung. I discussed with the hospitalist and he will be held his Revlimid.  He continues to be off Revlimid. He notes he was taking aspirin when this happened. Has been started on Eliquis for this.  REVIEW OF SYSTEMS:   10 Point review of Systems was done is negative except as noted above.   Past Medical History:  Diagnosis Date   Abdominal fullness 02/26/2016   Last Assessment & Plan:  overall improving with no pain, no further nausea and normal BM today.  No signs of obstruction or infection today.  Due for 10 year colonoscopy, mild anemia in December, will refer to GI    Acute renal failure ([place]) 03/06/2016   Acute renal failure superimposed on chronic kidney disease ([place]) 03/01/2016   AKI (acute kidney injury) ([place])    Arterial insufficiency of lower extremity ([place]) 09/01/2016   Overview:  Mild (2018)  Last Assessment & Plan:  Mild arterial insufficiency, he does not have classic symptoms of claudication. Advised daily foot checks and would consider further evaluation if new or worsening symptoms  Mild (2018)  Last Assessment & Plan:  asymptomatic.  We will follow-up as needed Formatting of this note might be different from the original. Mild (2018)  Last Assessment & Pla   Atherosclerotic heart disease of native coronary artery without angina pectoris 05/02/2020   Benign hypertension 07/21/2011   Last Assessment & Plan:  Formatting of this note might be different from the original.  We discussed blood pressure above goal today. Previously well controlled. I recommended he continue current medications, focus on lifestyle modifications including low-sodium diet and will reassess in 3 months   Benign prostatic hyperplasia 05/02/2020   CAD (coronary artery disease) 2006   stent LAD 3.0x18 [place], occluded RCA   Cancer ([place])    multiple myeloma   Cervical lymphadenopathy 03/31/2016   Colitis 05/12/2016   Coronary artery disease 07/21/2011   stent LAD 3.0x18 [place], occluded RCA  Overview:  Stent 2006 at [facility], Low risk stress test feb 2017  Last Assessment & Plan:  Declined further stress testing in 2015 due to cost and absence of symptoms.  Continues to be asymptomatic.  He has upcoming follow-up with cardiology for review. 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  Patient is up-to-date on Covid booster   Infected tooth 03/31/2016   Leukocytosis    Lipoma 07/19/2014   Overview:  Right scalp.  Seen by Payton Mccallum in past  Last Assessment & Plan:  Lipoma on right scalp getting larger, will refer to ENT, but more concerned about lymph node   Lymphadenopathy 11/21/2015   Last Assessment & Plan:  Right submandibular lymph node, non-tender, now larger. CBC 2 months ago showed mild anemia, normal peripheral smear.  Will recheck today along with ESR, sed rate.  Will refer to ENT for further evaluation.    Last Assessment & Plan:   Soft, right-sided postauricular mass, deferred intervention in 2018 at onset of myeloma diagnosis, gradually enlarging over time.  Had cons   Monoclonal gammopathy    Multiple myeloma ([place]) 03/20/2016   Last Assessment & Plan:  Given dx, rec social distancing, avoiding in person jury duty.  Letter provided Formatting of this note might be different from the original. Last Assessment & Plan:  Myeloma treatment is going well, he has upcoming oncology visit   Multiple myeloma in remission ([place]) 07/22/2016   Last Assessment & Plan:  Formatting of this note might be different from the original.  Patient remains in remission, followed by oncology.  Had labs drawn today   Palliative care by specialist    Pancreatitis 03/02/2016   Peripheral neuropathy 07/22/2016   Last Assessment & Plan:  Overall improved. Patient has discontinued gabapentin, did not feel much difference  Feet and fingers.  Multifactorial, DM, Chemo, MM  Last Assessment & Plan:   Doing well on gabapentin Formatting of this note might be different from the original. Last Assessment & Plan:  Overall improved. Patient has  discontinued gabapentin, did not feel much difference   Physical deconditioning 06/13/2016   Last Assessment & Plan:  Marked weakness.  Will request further home assistance per home health with bathing.  Discharged on a supplement- patient reports is unaffordable.  Advised prefer food as no barriers, appetite reasonable, ok to use glucerna or carnation instant breakfast if desired.   Pre-operative clearance 01/30/2017   Protein-calorie malnutrition, severe 03/03/2016   Right hip pain 09/27/2019   Last Assessment & Plan:  Formatting of this note might be different from the original.  Recent tests reassuring for no malignancy.  known hip arthritis.  Symptoms today separate from neuropathy.  He plans on starting program with trainer at [facility], declined PT today for gait and strength.  Will reassess at followup.   Screening for malignant neoplasm of colon 05/02/2020   Sinus tachycardia 05/13/2016   SIRS (systemic inflammatory response syndrome) ([place]) 05/12/2016   Stage 3 chronic kidney disease ([place]) 06/13/2016   Last Assessment & Plan:  Will check Cr/K today  June 2020, annual follow-up [place] [facility]  Last Assessment & Plan:   Recent labs reviewed- stable  Formatting of this note might be different from the original. Last Assessment & Plan:  Will check Cr/K today   Type 2 diabetes mellitus ([place]) 07/21/2011   Last Assessment & Plan:   Diabetes improved with fasting glucoses in a safe range.  Continue current regimen. Formatting of this note might be different from the original. Last Assessment & Plan:  Patient's diabetes has been well controlled at baseline on 12 units of Lantus daily.  He reports some hyperglycemia on the first few days after dexamethasone with resolution by the end of the week. We'll   Upper abdominal pain    Urinary retention    Vitamin D deficiency 05/02/2020   Weakness generalized     . Past Surgical History:  Procedure Laterality Date   CARDIAC CATHETERIZATION  2006   CORONARY ANGIOPLASTY  WITH STENT PLACEMENT  2007   IR FLUORO GUIDE CV LINE RIGHT  05/20/2016   IR US GUIDE VASC ACCESS RIGHT  05/20/2016    . Social History   Tobacco Use   Smoking status: Never   Smokeless tobacco: Never  Vaping Use   Vaping Use: Never used  Substance Use Topics   Alcohol use: No   Drug use: No    ALLERGIES:  has No Known Allergies.  MEDICATIONS:  Current Outpatient Medications  Medication Sig Dispense Refill   apixaban (ELIQUIS) 5 MG TABS tablet Take 1 tablet (5 mg total) by mouth 2 (two) times daily. 60 tablet 2   amLODipine (NORVASC) 10 MG tablet Take 10 mg by mouth daily.     atorvastatin (LIPITOR) 20 MG tablet Take 1 tablet (20 mg total) by mouth daily. 90 tablet 3   Cholecalciferol 10 MCG (400 UNIT) CAPS Take 1 capsule by mouth daily.     gabapentin (NEURONTIN) 300 MG capsule Take 300 mg by mouth daily.     LANTUS SOLOSTAR 100 UNIT/ML Solostar Pen Inject 16 Units into the skin daily.     lenalidomide (REVLIMID) 10 MG capsule TAKE 1 CAPSULE BY MOUTH EVERY DAY FOR 21 DAYS THEN 7 DAYS OFF 21 capsule 0   lenalidomide (REVLIMID) 10 MG capsule Take 1 capsule (10 mg total) by mouth daily. Take 1 capsule (10 mg total) by mouth daily for 21 days and then take 7 days off. 21 capsule 0   lisinopril (ZESTRIL) 2.5 MG tablet Take 2.5 mg by mouth daily.     metoprolol succinate (TOPROL-XL) 100 MG 24 hr tablet Take 100 mg by mouth daily.      nitroGLYCERIN (NITROSTAT) 0.4 MG SL tablet Place 1 tablet (0.4 mg total) under the tongue every 5 (five) minutes x 3 doses as needed for chest pain. 30 tablet 12   vitamin B-12 (CYANOCOBALAMIN) 1000 MCG tablet Take 1,000 mcg by mouth daily.     No current facility-administered medications for this visit.    PHYSICAL EXAMINATION: Telemedicine visit  LABORATORY DATA:   I have reviewed the data as listed     Latest Ref Rng & Units 09/02/2021    8:26 AM 08/31/2021   10:11 PM 08/05/2021   11:00 AM  CBC  WBC 4.0 - 10.5 K/uL 4.6  5.6  2.9   Hemoglobin  13.0 - 17.0 g/dL 9.9  11.7  11.2   Hematocrit 39.0 - 52.0 % 29.9  36.6  34.2   Platelets 150 - 400 K/uL 111  109  160   ANC 1k  .    Latest Ref Rng & Units 09/02/2021    8:26  AM 09/01/2021    3:45 AM 08/31/2021   10:11 PM  CMP  Glucose 70 - 99 mg/dL 175   120   BUN 8 - 23 mg/dL 16   18   Creatinine 0.61 - 1.24 mg/dL 1.61   1.53   Sodium 135 - 145 mmol/L 132   136   Potassium 3.5 - 5.1 mmol/L 3.3   3.7   Chloride 98 - 111 mmol/L 101   104   CO2 22 - 32 mmol/L 24   22   Calcium 8.9 - 10.3 mg/dL 7.9   8.7   Total Protein 6.5 - 8.1 g/dL  7.5    Total Bilirubin 0.3 - 1.2 mg/dL  1.0    Alkaline Phos 38 - 126 U/L  78    AST 15 - 41 U/L  18    ALT 0 - 44 U/L  19         RADIOGRAPHIC STUDIES: I have personally reviewed the radiological images as listed and agreed with the findings in the report. VAS [name] LOWER EXTREMITY VENOUS (DVT)  Result Date: 09/01/2021  Lower Venous DVT Study Patient Name:  Hashim Eichhorst.  Date of Exam:   09/01/2021 Medical Rec #: [id number]           Accession #:    [id number] Date of Birth: 1945-04-29          Patient Gender: M Patient Age:   51 years Exam Location:  [facility] Procedure:      VAS [name] LOWER EXTREMITY VENOUS (DVT) Referring Phys: TIMOTHY OPYD --------------------------------------------------------------------------------  Indications: Pulmonary embolism.  Risk Factors: Cancer Myeloma. Comparison Study: Prior negative right LEV done 08/12/18 Performing Technologist: Sharion Dove RVS  Examination Guidelines: A complete evaluation includes B-mode imaging, spectral Doppler, color Doppler, and power Doppler as needed of all accessible portions of each vessel. Bilateral testing is considered an integral part of a complete examination. Limited examinations for reoccurring indications may be performed as noted. The reflux portion of the exam is performed with the patient in reverse Trendelenburg.   +---------+---------------+---------+-----------+----------+--------------+ RIGHT    CompressibilityPhasicitySpontaneityPropertiesThrombus Aging +---------+---------------+---------+-----------+----------+--------------+ CFV      Full           Yes      Yes                                 +---------+---------------+---------+-----------+----------+--------------+ SFJ      Full                                                        +---------+---------------+---------+-----------+----------+--------------+ FV Prox  Full                                                        +---------+---------------+---------+-----------+----------+--------------+ FV Mid   Full                                                        +---------+---------------+---------+-----------+----------+--------------+  FV DistalFull                                                        +---------+---------------+---------+-----------+----------+--------------+ PFV      Full                                                        +---------+---------------+---------+-----------+----------+--------------+ POP      Full           Yes      Yes                                 +---------+---------------+---------+-----------+----------+--------------+ PTV      Full                                                        +---------+---------------+---------+-----------+----------+--------------+ PERO     Full                                                        +---------+---------------+---------+-----------+----------+--------------+   +---------+---------------+---------+-----------+----------+--------------+ LEFT     CompressibilityPhasicitySpontaneityPropertiesThrombus Aging +---------+---------------+---------+-----------+----------+--------------+ CFV      Full           Yes      Yes                                  +---------+---------------+---------+-----------+----------+--------------+ SFJ      Full                                                        +---------+---------------+---------+-----------+----------+--------------+ FV Prox  Full                                                        +---------+---------------+---------+-----------+----------+--------------+ FV Mid   Full                                                        +---------+---------------+---------+-----------+----------+--------------+ FV DistalFull                                                        +---------+---------------+---------+-----------+----------+--------------+  PFV      Full                                                        +---------+---------------+---------+-----------+----------+--------------+ POP      Full           Yes      Yes                                 +---------+---------------+---------+-----------+----------+--------------+ PTV      None                                         Acute          +---------+---------------+---------+-----------+----------+--------------+ PERO     Full                                                        +---------+---------------+---------+-----------+----------+--------------+     Summary: RIGHT: - There is no evidence of deep vein thrombosis in the lower extremity.  - No cystic structure found in the popliteal fossa.  LEFT: - Findings consistent with acute deep vein thrombosis involving the left posterior tibial veins. - No cystic structure found in the popliteal fossa.  - Incidentally, patient complained of left claudication symptoms and stated how his home health nurse stated he had "blockages" and is being referred to a neurologist. During the venous study, the tech noted what appears to be an occlusion in the mid to distal SFA with reconstitution noted in the distal SFA.  *See table(s) above for measurements and  observations.  Vascular consult recommended. Electronically signed by Harold Barban MD on 09/01/2021 at 8:48:50 PM.    Final    ECHOCARDIOGRAM COMPLETE  Result Date: 09/01/2021    ECHOCARDIOGRAM REPORT   Patient Name:   Oracio Galen. Date of Exam: 09/01/2021 Medical Rec #:  [id number]          Height:       71.0 in Accession #:    [id number]         Weight:       171.0 lb Date of Birth:  05/11/1945         BSA:          1.973 m Patient Age:    18 years           BP:           132/65 mmHg Patient Gender: M                  HR:           79 bpm. Exam Location:  Inpatient Procedure: 2D Echo Indications:    pulmonary embolus  History:        Patient has prior history of Echocardiogram examinations, most                 recent 11/09/2019. CAD, chronic kidney disease; Risk  Factors:Diabetes, Hypertension and Dyslipidemia.  Sonographer:    Johny Chess RDCS Referring Phys: [id number] [place]  1. Left ventricular ejection fraction, by estimation, is 60 to 65%. Left ventricular ejection fraction by PLAX is 60 %. The left ventricle has normal function. The left ventricle has no regional wall motion abnormalities. There is moderate concentric left ventricular hypertrophy. Left ventricular diastolic parameters are consistent with Grade I diastolic dysfunction (impaired relaxation).  2. Right ventricular systolic function is normal. The right ventricular size is normal.  3. The mitral valve is abnormal. Trivial mitral valve regurgitation.  4. The aortic valve is tricuspid. Aortic valve regurgitation is not visualized.  5. The inferior vena cava is normal in size with greater than 50% respiratory variability, suggesting right atrial pressure of 3 mmHg. Comparison(s): No significant change from prior study. 11/09/2019: LVEF 60-65%. FINDINGS  Left Ventricle: Left ventricular ejection fraction, by estimation, is 60 to 65%. Left ventricular ejection fraction by PLAX is 60 %. The left ventricle  has normal function. The left ventricle has no regional wall motion abnormalities. The left ventricular internal cavity size was normal in size. There is moderate concentric left ventricular hypertrophy. Left ventricular diastolic parameters are consistent with Grade I diastolic dysfunction (impaired relaxation). Indeterminate filling pressures. Right Ventricle: The right ventricular size is normal. No increase in right ventricular wall thickness. Right ventricular systolic function is normal. Left Atrium: Left atrial size was normal in size. Right Atrium: Right atrial size was normal in size. Pericardium: Trivial pericardial effusion is present. The pericardial effusion is posterior to the left ventricle. Mitral Valve: The mitral valve is abnormal. There is mild thickening of the mitral valve leaflet(s). Mild mitral annular calcification. Trivial mitral valve regurgitation. Tricuspid Valve: The tricuspid valve is grossly normal. Tricuspid valve regurgitation is not demonstrated. Aortic Valve: The aortic valve is tricuspid. Aortic valve regurgitation is not visualized. Pulmonic Valve: The pulmonic valve was normal in structure. Pulmonic valve regurgitation is not visualized. Aorta: The aortic root and ascending aorta are structurally normal, with no evidence of dilitation. Venous: The inferior vena cava is normal in size with greater than 50% respiratory variability, suggesting right atrial pressure of 3 mmHg. IAS/Shunts: No atrial level shunt detected by color flow Doppler.  LEFT VENTRICLE PLAX 2D LV EF:         Left            Diastology                ventricular     LV e' medial:    5.11 cm/s                ejection        LV E/e' medial:  14.4                fraction by     LV e' lateral:   6.09 cm/s                PLAX is 60      LV E/e' lateral: 12.1                %. LVIDd:         4.10 cm LVIDs:         2.80 cm LV PW:         1.00 cm LV IVS:        1.30 cm LVOT diam:     2.20 cm LV SV:  60 LV SV  Index:   30 LVOT Area:     3.80 cm  LV Volumes (MOD) LV vol d, MOD    72.3 ml A2C: LV vol s, MOD    34.1 ml A2C: LV SV MOD A2C:   38.2 ml RIGHT VENTRICLE RV S prime:     16.30 cm/s TAPSE (M-mode): 2.8 cm LEFT ATRIUM             Index        RIGHT ATRIUM           Index LA diam:        4.10 cm 2.08 cm/m   RA Area:     10.80 cm LA Vol (A2C):   56.7 ml 28.74 ml/m  RA Volume:   21.50 ml  10.90 ml/m LA Vol (A4C):   61.0 ml 30.92 ml/m LA Biplane Vol: 58.7 ml 29.76 ml/m  AORTIC VALVE             PULMONIC VALVE LVOT Vmax:   83.10 cm/s  PR End Diast Vel: 9.86 msec LVOT Vmean:  54.700 cm/s LVOT VTI:    0.158 m  AORTA Ao Root diam: 3.40 cm Ao Asc diam:  3.40 cm MITRAL VALVE MV Area (PHT): 3.77 cm     SHUNTS MV Decel Time: 201 msec     Systemic VTI:  0.16 m MV E velocity: 73.70 cm/s   Systemic Diam: 2.20 cm MV A velocity: 124.00 cm/s MV E/A ratio:  0.59 Lyman Bishop MD Electronically signed by Lyman Bishop MD Signature Date/Time: 09/01/2021/11:59:40 AM    Final    CT Angio Chest/Abd/Pel for Dissection W and/or Wo Contrast  Result Date: 09/01/2021 CLINICAL DATA:  Chest/abdominal pain, elevated troponin, evaluate for dissection EXAM: CT ANGIOGRAPHY CHEST, ABDOMEN AND PELVIS TECHNIQUE: Non-contrast CT of the chest was initially obtained. Multidetector CT imaging through the chest, abdomen and pelvis was performed using the standard protocol during bolus administration of intravenous contrast. Multiplanar reconstructed images and MIPs were obtained and reviewed to evaluate the vascular anatomy. RADIATION DOSE REDUCTION: This exam was performed according to the departmental dose-optimization program which includes automated exposure control, adjustment of the mA and/or kV according to patient size and/or use of iterative reconstruction technique. CONTRAST:  5mL OMNIPAQUE IOHEXOL 350 MG/ML SOLN COMPARISON:  Chest radiograph dated 08/31/2024. CT abdomen/pelvis dated 07/08/2021. FINDINGS: CTA CHEST FINDINGS  Cardiovascular: On unenhanced CT, there is no evidence of intramural hematoma. Preferential opacification of the thoracic aorta. No evidence of thoracic aortic aneurysm or dissection. Atherosclerotic calcifications of the aortic arch. Although not tailored for evaluation of the pulmonary arteries, there are lobar/segmental pulmonary emboli in the right middle and lower lobes (series 6/images 76, 79, and 89). Overall clot burden is small. Cardiomegaly. Elevated RV to LV ratio (1.13), raising the possibility of right heart strain. No pericardial effusion. Three vessel coronary atherosclerosis. Mediastinum/Nodes: No suspicious mediastinal lymphadenopathy. Visualized thyroid is unremarkable. Lungs/Pleura: Mild eventration of the right hemidiaphragm with associated patchy right middle and lower lobe opacities, likely atelectasis. However, developing pulmonary infarct in the right lower lobe is suspected (series 8/image 96). No suspicious pulmonary nodules. No pleural effusion or pneumothorax. Musculoskeletal: Visualized osseous structures are within normal limits. Review of the MIP images confirms the above findings. CTA ABDOMEN AND PELVIS FINDINGS VASCULAR Aorta: Patent.  Atherosclerotic calcifications. Celiac: Patent. SMA: Patent. Renals: Patent bilaterally.  Atherosclerotic calcifications. IMA: Patent. Inflow: Patent bilaterally.  Atherosclerotic calcifications. Veins: Unremarkable. Review of the MIP images confirms the above findings.  NON-VASCULAR Hepatobiliary: Liver is within normal limits. Gallbladder is unremarkable. No intrahepatic or extrahepatic ductal dilatation. Pancreas: Within normal limits. Spleen: Within normal limits. Adrenals/Urinary Tract: Adrenal glands are within normal limits. Right kidney is within normal limits. 10 mm simple left lower pole renal cyst (series 6/image 169), benign (Bosniak I). No follow-up is recommended. Bladder is underdistended but unremarkable. Stomach/Bowel: Stomach is  notable for a tiny hiatal hernia. No evidence of bowel obstruction. Normal appendix (series 6/image 229). No colonic wall thickening or inflammatory changes. Lymphatic: No suspicious abdominopelvic lymphadenopathy. Reproductive: Prostatomegaly, suggesting BPH. Other: No abdominopelvic ascites. Musculoskeletal: Mild degenerative changes at L3-4. Review of the MIP images confirms the above findings. IMPRESSION: No evidence of thoracoabdominal aortic aneurysm or dissection. Lobar/segmental pulmonary emboli in the right middle and lower lobes. Overall clot burden is small. Positive for acute PE with CT evidence of right heart strain (RV/LV Ratio = 1.13) consistent with at least submassive (intermediate risk) PE. The presence of right heart strain has been associated with an increased risk of morbidity and mortality. Please refer to the "Code PE Focused" order set in EPIC. Suspected developing pulmonary infarct in the right lower lobe. Critical Value/emergent results were called by telephone at the time of interpretation on 09/01/2021 at 1:15 am to provider Dr Betsey Holiday, who verbally acknowledged these results. Electronically Signed   By: Julian Hy M.D.   On: 09/01/2021 01:16   DG Chest 2 View  Result Date: 08/31/2021 CLINICAL DATA:  Chest pain EXAM: CHEST - 2 VIEW COMPARISON:  07/17/2021 FINDINGS: Right basilar airspace opacity could reflect atelectasis or infiltrate. Left lung clear. Heart is normal size. No effusions. No acute bony abnormality. IMPRESSION: Right base atelectasis or infiltrate. Electronically Signed   By: Rolm Baptise M.D.   On: 08/31/2021 22:56    ASSESSMENT & PLAN:   76 y.o.  with multiple medical comorbidities but fairly good performance status overall with   1) ISS Stage II IgG Kapppa multiple myeloma. Anemia + Renal insufficiency.  Bone survey neg for concerning bone lesions. significantly elevated kappa free light chains 858, lambda 18.4, with a ratio of 46.65 . He was also  noted to have an M spike of 1.7 g/dL with IFE showing IgG kappa monoclonal protein. Quantitative IgG level was increased to nearly 2500 mg/dL. 24-hour UPEP showed monoclonal protein of 1300 mg per 24 hours which constituted about 86% of all the urinary protein. Bone marrow biopsy showed 20-30% Restricted plasma cells consistent with multiple myeloma Cytogenetics/FISH  Showed +4, +11, and +17   Patient has been treated with CyBord X 2 cycles . Treatment interrupted due to severe c diff colitis with prolonged hospitalization and decline in performance status.  Patient is much improved and back to baseline now. M protein undetected with neg IFE Has been treated with 5 cycles of Rd  Currently on maintenance Revlikmid and in Remission.  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 Had an element of obstructive uropathy due to BPH which has resolved. Creatinine continues to be stable.   3) Normocytic anemia likely related to multiple myeloma.-stable. No abdominal pain or discomfort over the right upper quadrant. No fevers or chills.  PLAN:  4) DM2 - controlled -continue management per PCP    FOLLOW UP: Return to clinic with [name] in 2 months with labs 1 week prior to clinic visit  The total time spent in the appointment was 12 minutes*.  All of the patient's questions were answered with  apparent satisfaction. The patient knows to call the clinic with any problems, questions or concerns.   Sullivan Lone MD MS AAHIVMS [facility] Hematology/Oncology Physician [facility]  .*Total Encounter Time as defined by the Centers for Medicare and Medicaid Services includes, in addition to the face-to-face time of a patient visit (documented in the note above) non-face-to-face time: obtaining and reviewing outside history, ordering and reviewing medications, tests or procedures, care coordination (communications with other health care  professionals or caregivers) and documentation in the medical record.

## 2021-10-08 ENCOUNTER — Inpatient Hospital Stay: Payer: Medicare PPO | Attending: Hematology

## 2021-10-08 ENCOUNTER — Other Ambulatory Visit: Payer: Self-pay

## 2021-10-08 DIAGNOSIS — D649 Anemia, unspecified: Secondary | ICD-10-CM | POA: Insufficient documentation

## 2021-10-08 DIAGNOSIS — E119 Type 2 diabetes mellitus without complications: Secondary | ICD-10-CM | POA: Insufficient documentation

## 2021-10-08 DIAGNOSIS — Z7982 Long term (current) use of aspirin: Secondary | ICD-10-CM | POA: Insufficient documentation

## 2021-10-08 DIAGNOSIS — C9000 Multiple myeloma not having achieved remission: Secondary | ICD-10-CM

## 2021-10-08 DIAGNOSIS — Z79899 Other long term (current) drug therapy: Secondary | ICD-10-CM | POA: Diagnosis not present

## 2021-10-08 DIAGNOSIS — C9001 Multiple myeloma in remission: Secondary | ICD-10-CM | POA: Diagnosis present

## 2021-10-08 LAB — CBC WITH DIFFERENTIAL (CANCER CENTER ONLY)
Abs Immature Granulocytes: 0 10*3/uL (ref 0.00–0.07)
Basophils Absolute: 0 10*3/uL (ref 0.0–0.1)
Basophils Relative: 0 %
Eosinophils Absolute: 0.1 10*3/uL (ref 0.0–0.5)
Eosinophils Relative: 2 %
HCT: 34.8 % — ABNORMAL LOW (ref 39.0–52.0)
Hemoglobin: 11.3 g/dL — ABNORMAL LOW (ref 13.0–17.0)
Immature Granulocytes: 0 %
Lymphocytes Relative: 27 %
Lymphs Abs: 1.3 10*3/uL (ref 0.7–4.0)
MCH: 28.6 pg (ref 26.0–34.0)
MCHC: 32.5 g/dL (ref 30.0–36.0)
MCV: 88.1 fL (ref 80.0–100.0)
Monocytes Absolute: 0.5 10*3/uL (ref 0.1–1.0)
Monocytes Relative: 11 %
Neutro Abs: 2.9 10*3/uL (ref 1.7–7.7)
Neutrophils Relative %: 60 %
Platelet Count: 191 10*3/uL (ref 150–400)
RBC: 3.95 MIL/uL — ABNORMAL LOW (ref 4.22–5.81)
RDW: 18.3 % — ABNORMAL HIGH (ref 11.5–15.5)
WBC Count: 4.8 10*3/uL (ref 4.0–10.5)
nRBC: 0 % (ref 0.0–0.2)

## 2021-10-08 LAB — CMP (CANCER CENTER ONLY)
ALT: 13 U/L (ref 0–44)
AST: 12 U/L — ABNORMAL LOW (ref 15–41)
Albumin: 3.7 g/dL (ref 3.5–5.0)
Alkaline Phosphatase: 94 U/L (ref 38–126)
Anion gap: 3 — ABNORMAL LOW (ref 5–15)
BUN: 23 mg/dL (ref 8–23)
CO2: 28 mmol/L (ref 22–32)
Calcium: 8.8 mg/dL — ABNORMAL LOW (ref 8.9–10.3)
Chloride: 105 mmol/L (ref 98–111)
Creatinine: 1.49 mg/dL — ABNORMAL HIGH (ref 0.61–1.24)
GFR, Estimated: 48 mL/min — ABNORMAL LOW (ref >60)
Glucose, Bld: 228 mg/dL — ABNORMAL HIGH (ref 70–99)
Potassium: 4 mmol/L (ref 3.5–5.1)
Sodium: 136 mmol/L (ref 135–145)
Total Bilirubin: 0.4 mg/dL (ref 0.3–1.2)
Total Protein: 7.4 g/dL (ref 6.5–8.1)

## 2021-10-09 LAB — KAPPA/LAMBDA LIGHT CHAINS
Kappa free light chain: 53.5 mg/L — ABNORMAL HIGH (ref 3.3–19.4)
Kappa, lambda light chain ratio: 1.9 — ABNORMAL HIGH (ref 0.26–1.65)
Lambda free light chains: 28.2 mg/L — ABNORMAL HIGH (ref 5.7–26.3)

## 2021-10-14 LAB — MULTIPLE MYELOMA PANEL, SERUM
Albumin SerPl Elph-Mcnc: 3.4 g/dL (ref 2.9–4.4)
Albumin/Glob SerPl: 0.9 (ref 0.7–1.7)
Alpha 1: 0.2 g/dL (ref 0.0–0.4)
Alpha2 Glob SerPl Elph-Mcnc: 0.8 g/dL (ref 0.4–1.0)
B-Globulin SerPl Elph-Mcnc: 1 g/dL (ref 0.7–1.3)
Gamma Glob SerPl Elph-Mcnc: 1.8 g/dL (ref 0.4–1.8)
Globulin, Total: 3.8 g/dL (ref 2.2–3.9)
IgA: 435 mg/dL (ref 61–437)
IgG (Immunoglobin G), Serum: 1904 mg/dL — ABNORMAL HIGH (ref 603–1613)
IgM (Immunoglobulin M), Srm: 37 mg/dL (ref 15–143)
Total Protein ELP: 7.2 g/dL (ref 6.0–8.5)

## 2021-10-15 ENCOUNTER — Ambulatory Visit: Payer: Medicare HMO | Admitting: Hematology

## 2021-10-18 ENCOUNTER — Inpatient Hospital Stay (HOSPITAL_BASED_OUTPATIENT_CLINIC_OR_DEPARTMENT_OTHER): Payer: Medicare PPO | Admitting: Hematology

## 2021-10-18 ENCOUNTER — Other Ambulatory Visit: Payer: Self-pay

## 2021-10-18 DIAGNOSIS — C9001 Multiple myeloma in remission: Secondary | ICD-10-CM

## 2021-10-18 NOTE — Progress Notes (Signed)
[name]  HEMATOLOGY ONCOLOGY PHONE VISIT NOTE  Date of service: .10/18/2021   Patient Care Team: [name], MD as PCP - General (Family Medicine) Brunetta Genera, MD as Consulting Physician (Hematology)  Chief Complaint : F/u for continued evaluation management of multiple myeloma  Diagnosis:  IgG Kappa multiple myeloma    Current Treatment:  Revlimid maintenance   INTERVAL HISTORY:  Fredick Schlosser. is here for continued evaluation and management of his multiple myeloma and pulmonary embolism. He has been taking Eliquis without any bleeding issues and notes no chest pain or shortness of breath. No acute new focal bone pains or other acute new symptoms. Has been off Revlimid since his pulmonary emboli. Notes some intermittent dizziness with certain head positions which is suggestive of benign positional vertigo.  He was recommended to follow-up with his PCP regarding this and if persistent consideration of vestibular physical therapy referral. We discussed importance of vaccinations. We discussed pros and cons of continued maintenance therapy versus active surveillance and the patient prefers the latter. No new infection issues. He notes that he is having some prostate issues and there is consideration for a prostate biopsy.  Labs done on 10/21/2021 were discussed in detail with the patient.  REVIEW OF SYSTEMS:   10 Point review of Systems was done is negative except as noted above.   Past Medical History:  Diagnosis Date   Abdominal fullness 02/26/2016   Last Assessment & Plan:  overall improving with no pain, no further nausea and normal BM today.  No signs of obstruction or infection today.  Due for 10 year colonoscopy, mild anemia in December, will refer to GI   Acute renal failure ([place]) 03/06/2016   Acute renal failure superimposed on chronic kidney disease ([place]) 03/01/2016   AKI (acute kidney injury) ([place])    Arterial insufficiency of lower extremity ([place])  09/01/2016   Overview:  Mild (2018)  Last Assessment & Plan:  Mild arterial insufficiency, he does not have classic symptoms of claudication. Advised daily foot checks and would consider further evaluation if new or worsening symptoms  Mild (2018)  Last Assessment & Plan:  asymptomatic.  We will follow-up as needed Formatting of this note might be different from the original. Mild (2018)  Last Assessment & Pla   Atherosclerotic heart disease of native coronary artery without angina pectoris 05/02/2020   Benign hypertension 07/21/2011   Last Assessment & Plan:  Formatting of this note might be different from the original.  We discussed blood pressure above goal today. Previously well controlled. I recommended he continue current medications, focus on lifestyle modifications including low-sodium diet and will reassess in 3 months   Benign prostatic hyperplasia 05/02/2020   CAD (coronary artery disease) 2006   stent LAD 3.0x18 [place], occluded RCA   Cancer ([place])    multiple myeloma   Cervical lymphadenopathy 03/31/2016   Colitis 05/12/2016   Coronary artery disease 07/21/2011   stent LAD 3.0x18 Cypher, occluded RCA  Overview:  Stent 2006 at [facility], Low risk stress test feb 2017  Last Assessment & Plan:  Declined further stress testing in 2015 due to cost and absence of symptoms.  Continues to be asymptomatic.  He has upcoming follow-up with cardiology for review. 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  Patient is up-to-date on Covid booster   Infected tooth 03/31/2016   Leukocytosis    Lipoma 07/19/2014   Overview:  Right scalp.  Seen by Payton Mccallum in past  Last Assessment & Plan:  Lipoma on right scalp getting larger, will refer to ENT, but more concerned about lymph node   Lymphadenopathy 11/21/2015   Last Assessment & Plan:  Right submandibular lymph node, non-tender, now larger. CBC 2 months ago showed mild anemia, normal peripheral smear.  Will recheck today along with ESR, sed rate.  Will refer to ENT for further evaluation.    Last Assessment & Plan:   Soft, right-sided postauricular mass, deferred intervention in 2018 at onset of myeloma diagnosis, gradually enlarging over time.  Had cons   Monoclonal gammopathy    Multiple myeloma ([place]) 03/20/2016   Last Assessment & Plan:  Given dx, rec social distancing, avoiding in person jury duty.  Letter provided Formatting of this note might be different from the original. Last Assessment & Plan:  Myeloma treatment is going well, he has upcoming oncology visit   Multiple myeloma in remission ([place]) 07/22/2016   Last Assessment & Plan:  Formatting of this note might be different from the original.  Patient remains in remission, followed by oncology.  Had labs drawn today   Palliative care by specialist    Pancreatitis 03/02/2016   Peripheral neuropathy 07/22/2016   Last Assessment & Plan:  Overall improved. Patient has discontinued gabapentin, did not feel much difference  Feet and fingers.  Multifactorial, DM, Chemo, MM  Last Assessment & Plan:   Doing well on gabapentin Formatting of this note might be different from the original. Last Assessment & Plan:  Overall improved. Patient has discontinued gabapentin, did not feel much difference   Physical deconditioning 06/13/2016   Last Assessment & Plan:  Marked weakness.  Will request further home assistance per home health with  bathing.  Discharged on a supplement- patient reports is unaffordable.  Advised prefer food as no barriers, appetite reasonable, ok to use glucerna or carnation instant breakfast if desired.   Pre-operative clearance 01/30/2017   Protein-calorie malnutrition, severe 03/03/2016   Right hip pain 09/27/2019   Last Assessment & Plan:  Formatting of this note might be different from the original.  Recent tests reassuring for no malignancy.  known hip arthritis.  Symptoms today separate from neuropathy.  He plans on starting program with trainer at [facility], declined PT today for gait and strength.  Will reassess at followup.   Screening for malignant neoplasm of colon 05/02/2020   Sinus tachycardia 05/13/2016   SIRS (systemic inflammatory response syndrome) ([place]) 05/12/2016   Stage 3 chronic kidney disease ([place]) 06/13/2016   Last Assessment & Plan:  Will check Cr/K today  June 2020, annual follow-up [place] [facility]  Last Assessment & Plan:   Recent labs reviewed- stable Formatting of this note might be different from the original. Last Assessment & Plan:  Will check Cr/K today   Type 2 diabetes mellitus ([place]) 07/21/2011   Last Assessment & Plan:   Diabetes improved with fasting glucoses  in a safe range.  Continue current regimen. Formatting of this note might be different from the original. Last Assessment & Plan:  Patient's diabetes has been well controlled at baseline on 12 units of Lantus daily.  He reports some hyperglycemia on the first few days after dexamethasone with resolution by the end of the week. We'll   Upper abdominal pain    Urinary retention    Vitamin D deficiency 05/02/2020   Weakness generalized     . Past Surgical History:  Procedure Laterality Date   CARDIAC CATHETERIZATION  2006   CORONARY ANGIOPLASTY WITH STENT PLACEMENT  2007   IR FLUORO GUIDE CV LINE RIGHT  05/20/2016   IR US GUIDE VASC ACCESS RIGHT  05/20/2016    . Social History   Tobacco Use   Smoking status: Never   Smokeless  tobacco: Never  Vaping Use   Vaping Use: Never used  Substance Use Topics   Alcohol use: No   Drug use: No    ALLERGIES:  has No Known Allergies.  MEDICATIONS:  Current Outpatient Medications  Medication Sig Dispense Refill   amLODipine (NORVASC) 10 MG tablet Take 10 mg by mouth daily.     apixaban (ELIQUIS) 5 MG TABS tablet Take 1 tablet (5 mg total) by mouth 2 (two) times daily. 60 tablet 2   atorvastatin (LIPITOR) 20 MG tablet Take 1 tablet (20 mg total) by mouth daily. 90 tablet 3   Cholecalciferol 10 MCG (400 UNIT) CAPS Take 1 capsule by mouth daily.     gabapentin (NEURONTIN) 300 MG capsule Take 300 mg by mouth daily.     LANTUS SOLOSTAR 100 UNIT/ML Solostar Pen Inject 16 Units into the skin daily.     lenalidomide (REVLIMID) 10 MG capsule TAKE 1 CAPSULE BY MOUTH EVERY DAY FOR 21 DAYS THEN 7 DAYS OFF 21 capsule 0   lenalidomide (REVLIMID) 10 MG capsule Take 1 capsule (10 mg total) by mouth daily. Take 1 capsule (10 mg total) by mouth daily for 21 days and then take 7 days off. 21 capsule 0   lisinopril (ZESTRIL) 2.5 MG tablet Take 2.5 mg by mouth daily.     metoprolol succinate (TOPROL-XL) 100 MG 24 hr tablet Take 100 mg by mouth daily.      nitroGLYCERIN (NITROSTAT) 0.4 MG SL tablet Place 1 tablet (0.4 mg total) under the tongue every 5 (five) minutes x 3 doses as needed for chest pain. 30 tablet 12   vitamin B-12 (CYANOCOBALAMIN) 1000 MCG tablet Take 1,000 mcg by mouth daily.     No current facility-administered medications for this visit.    PHYSICAL EXAMINATION: Vital signs reviewed GENERAL:alert, in no acute distress and comfortable SKIN: no acute rashes, no significant lesions EYES: conjunctiva are pink and non-injected, sclera anicteric OROPHARYNX: MMM, no exudates, no oropharyngeal erythema or ulceration NECK: supple, no JVD LYMPH:  no palpable lymphadenopathy in the cervical, axillary or inguinal regions LUNGS: clear to auscultation b/l with normal respiratory  effort HEART: regular rate & rhythm ABDOMEN:  normoactive bowel sounds , non tender, not distended. Extremity: no pedal edema PSYCH: alert & oriented x 3 with fluent speech NEURO: no focal motor/sensory deficits   LABORATORY DATA:   I have reviewed the data as listed     Latest Ref Rng & Units 10/08/2021    1:04 PM 09/02/2021    8:26 AM 08/31/2021   10:11 PM  CBC  WBC 4.0 - 10.5 K/uL 4.8  4.6  5.6   Hemoglobin  13.0 - 17.0 g/dL 11.3  9.9  11.7   Hematocrit 39.0 - 52.0 % 34.8  29.9  36.6   Platelets 150 - 400 K/uL 191  111  109   ANC 1k  .    Latest Ref Rng & Units 10/08/2021    1:04 PM 09/02/2021    8:26 AM 09/01/2021    3:45 AM  CMP  Glucose 70 - 99 mg/dL 228  175    BUN 8 - 23 mg/dL 23  16    Creatinine 0.61 - 1.24 mg/dL 1.49  1.61    Sodium 135 - 145 mmol/L 136  132    Potassium 3.5 - 5.1 mmol/L 4.0  3.3    Chloride 98 - 111 mmol/L 105  101    CO2 22 - 32 mmol/L 28  24    Calcium 8.9 - 10.3 mg/dL 8.8  7.9    Total Protein 6.5 - 8.1 g/dL 7.4   7.5   Total Bilirubin 0.3 - 1.2 mg/dL 0.4   1.0   Alkaline Phos 38 - 126 U/L 94   78   AST 15 - 41 U/L 12   18   ALT 0 - 44 U/L 13   19        RADIOGRAPHIC STUDIES: I have personally reviewed the radiological images as listed and agreed with the findings in the report. No results found.  ASSESSMENT & PLAN:   76 y.o.  with multiple medical comorbidities but fairly good performance status overall with   1) ISS Stage II IgG Kapppa multiple myeloma. Anemia + Renal insufficiency.  Bone survey neg for concerning bone lesions. significantly elevated kappa free light chains 858, lambda 18.4, with a ratio of 46.65 . He was also noted to have an M spike of 1.7 g/dL with IFE showing IgG kappa monoclonal protein. Quantitative IgG level was increased to nearly 2500 mg/dL. 24-hour UPEP showed monoclonal protein of 1300 mg per 24 hours which constituted about 86% of all the urinary protein. Bone marrow biopsy showed 20-30% Restricted  plasma cells consistent with multiple myeloma Cytogenetics/FISH  Showed +4, +11, and +17   Patient has been treated with CyBord X 2 cycles . Treatment interrupted due to severe c diff colitis with prolonged hospitalization and decline in performance status.  Patient is much improved and back to baseline now. M protein undetected with neg IFE Has been treated with 5 cycles of Rd  Had been on maintenance Revlikmid for more than 2 years until PEs in August 2023.  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 Had an element of obstructive uropathy due to BPH which has resolved. Creatinine continues to be stable.   3) Normocytic anemia likely related to multiple myeloma.-stable. No abdominal pain or discomfort over the right upper quadrant. No fevers or chills.  #4 right lower and middle lobe pulmonary embolism August 2023-Revlimid possible provoking factor. currently off Revlimid. PLAN:  -Patient's labs from 11/04/2021 were discussed in detail with him. CBC shows stable hemoglobin of 11.3 with normal WBC count and platelets CMP with stable chronic kidney disease creatinine of 1.49.,  Calcium levels within normal limits at 8.8 Kappa lambda free light chains and ratio stable and unchanged Myeloma panel with no M spike Patient has no obvious lab or clinical evidence of myeloma progression at this time. We discussed the risks and benefits of continuing maintenance Revlimid in the context of his pulmonary embolism and the fact that he has been  on this for more than 2 years with stable disease.  Patient chooses to stay off the Revlimid. -Continue Eliquis for PE management. -Hold aspirin while on Eliquis -Discussed importance of considering influenza, RSV and COVID-19 vaccinations. -He is following with urology for his prostate issues. -Recommended follow-up with PCP if symptoms of his benign positional vertigo continue.  4) DM2 - controlled -continue  management per PCP    FOLLOW UP: RTC with Dr Irene Limbo in 3 months Labs 1 week prior to clinic visit  The total time spent in the appointment was 32 minutes*.  All of the patient's questions were answered with apparent satisfaction. The patient knows to call the clinic with any problems, questions or concerns.   Sullivan Lone MD MS AAHIVMS [facility] Hematology/Oncology Physician [facility]  .*Total Encounter Time as defined by the Centers for Medicare and Medicaid Services includes, in addition to the face-to-face time of a patient visit (documented in the note above) non-face-to-face time: obtaining and reviewing outside history, ordering and reviewing medications, tests or procedures, care coordination (communications with other health care professionals or caregivers) and documentation in the medical record.

## 2021-10-21 DIAGNOSIS — H6120 Impacted cerumen, unspecified ear: Secondary | ICD-10-CM

## 2021-10-21 DIAGNOSIS — R2681 Unsteadiness on feet: Secondary | ICD-10-CM | POA: Insufficient documentation

## 2021-10-21 HISTORY — DX: Unsteadiness on feet: R26.81

## 2021-10-21 HISTORY — DX: Impacted cerumen, unspecified ear: H61.20

## 2021-11-07 ENCOUNTER — Ambulatory Visit: Payer: Medicare PPO | Attending: Cardiology | Admitting: Cardiology

## 2021-11-07 VITALS — BP 144/76 | HR 74 | Ht 68.6 in | Wt 177.1 lb

## 2021-11-07 DIAGNOSIS — I10 Essential (primary) hypertension: Secondary | ICD-10-CM

## 2021-11-07 DIAGNOSIS — I2510 Atherosclerotic heart disease of native coronary artery without angina pectoris: Secondary | ICD-10-CM

## 2021-11-07 DIAGNOSIS — E0829 Diabetes mellitus due to underlying condition with other diabetic kidney complication: Secondary | ICD-10-CM | POA: Insufficient documentation

## 2021-11-07 DIAGNOSIS — Z86711 Personal history of pulmonary embolism: Secondary | ICD-10-CM | POA: Insufficient documentation

## 2021-11-07 DIAGNOSIS — E0821 Diabetes mellitus due to underlying condition with diabetic nephropathy: Secondary | ICD-10-CM | POA: Diagnosis not present

## 2021-11-07 HISTORY — DX: Personal history of pulmonary embolism: Z86.711

## 2021-11-07 MED ORDER — ASPIRIN 81 MG PO TBEC: 81.0000 mg | DELAYED_RELEASE_TABLET | Freq: Every day | ORAL | 3 refills | Status: AC

## 2021-11-07 NOTE — Progress Notes (Signed)
Cardiology Office Note:    Date:  11/07/2021   ID:  Douglas Rohrer., DOB 1945-02-08, MRN [id number]  PCP:  [name], MD  Cardiologist:  Jenean Lindau, MD   Referring MD: [name], MD    ASSESSMENT:    1. Coronary artery disease involving native coronary artery of native heart without angina pectoris   2. Essential hypertension   3. History of pulmonary embolism   4. Diabetes mellitus due to underlying condition, controlled, with diabetic nephropathy, without long-term current use of insulin (HCC)    PLAN:    In order of problems listed above:  Coronary artery disease: Post stenting in the remote past: Secondary prevention stressed with the patient.  Importance of compliance with diet and medication stressed any vocalized understanding.  He was advised to walk to the best of his ability and he promises to do so.  He was also advised to take a coated baby aspirin on a daily basis. Essential hypertension: Blood pressure stable and diet was emphasized.  His blood pressures are better at home and he mentioned to me the readings Diabetes mellitus and nephropathy.  I discussed this at extensive length.  Diet was emphasized.  Importance of staying compliant with hemoglobin A1c numbers was emphasized. History of pulmonary embolism: On anticoagulation.  Benefits and potential risks of anticoagulation discussed and questions were answered to satisfaction. Patient will be seen in follow-up appointment in 9 months or earlier if the patient has any concerns    Medication Adjustments/Labs and Tests Ordered: Current medicines are reviewed at length with the patient today.  Concerns regarding medicines are outlined above.  No orders of the defined types were placed in this encounter.  Meds ordered this encounter  Medications   aspirin EC 81 MG tablet    Sig: Take 1 tablet (81 mg total) by mouth daily. Swallow whole.    Dispense:  90 tablet    Refill:  3     No chief  complaint on file.    History of Present Illness:    Douglas Serviss. is a 75 y.o. male.  Patient has past medical history of coronary artery disease, essential hypertension, mixed dyslipidemia, diabetes mellitus and recently was diagnosed with pulmonary embolism and treated.  Since then he has done fine no chest pain orthopnea or PND.  He is ambulating on a regular basis.  He mentions to me that he has done physical rehab and is happy about it at the time of my evaluation, the patient is alert awake oriented and in no distress.  Past Medical History:  Diagnosis Date   Abdominal fullness 02/26/2016   Last Assessment & Plan:  overall improving with no pain, no further nausea and normal BM today.  No signs of obstruction or infection today.  Due for 10 year colonoscopy, mild anemia in December, will refer to GI   Acute renal failure ([place]) 03/06/2016   Acute renal failure superimposed on chronic kidney disease ([place]) 03/01/2016   AKI (acute kidney injury) ([place])    Arterial insufficiency of lower extremity ([place]) 09/01/2016   Overview:  Mild (2018)  Last Assessment & Plan:  Mild arterial insufficiency, he does not have classic symptoms of claudication. Advised daily foot checks and would consider further evaluation if new or worsening symptoms  Mild (2018)  Last Assessment & Plan:  asymptomatic.  We will follow-up as needed Formatting of this note might be different from the original. Mild (2018)  Last Assessment &  Pla   Atherosclerotic heart disease of native coronary artery without angina pectoris 05/02/2020   Benign hypertension 07/21/2011   Last Assessment & Plan:  Formatting of this note might be different from the original.  We discussed blood pressure above goal today. Previously well controlled. I recommended he continue current medications, focus on lifestyle modifications including low-sodium diet and will reassess in 3 months   Benign prostatic hyperplasia 05/02/2020   CAD (coronary artery  disease) 2006   stent LAD 3.0x18 [place], occluded RCA   Cancer ([place])    multiple myeloma   Cerumen impaction 10/21/2021   Last Assessment & Plan: Formatting of this note might be different from the original.  Right cerumen impaction cleared with irrigation.  Left with yellow appearing discharge,  patient reports he also recently been flushing it at home. Patient denies any pain.  Concern for immunocompromised, chronic otorrhea, will rx ofloxacin drops per formualry   Cervical lymphadenopathy 03/31/2016   Colitis 05/12/2016   Coronary artery disease 07/21/2011   stent LAD 3.0x18 [place], occluded RCA  Overview:  Stent [facility] at [facility], Low risk stress test feb 2017  Last Assessment & Plan:  Declined further stress testing in 2015 due to cost and absence of symptoms.  Continues to be asymptomatic.  He has upcoming follow-up with cardiology for review. Encouraged to keep appt  Stent 2006 at [facility], Low risk s   Diabetes mellitus without complication [facility])    DNR (do not resuscitate) discussion    Elevated PSA 01/24/2015   Last Assessment & Plan: Formatting of this note might be different from the original.  Reports he has a pending biopsy which will be rescheduled due to recent DVT.  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  Patient is up-to-date on Covid booster   Infected tooth 03/31/2016   Leukocytosis    Lipoma  07/19/2014   Overview:  Right scalp.  Seen by Payton Mccallum in past  Last Assessment & Plan:  Lipoma on right scalp getting larger, will refer to ENT, but more concerned about lymph node   Lymphadenopathy 11/21/2015   Last Assessment & Plan:  Right submandibular lymph node, non-tender, now larger. CBC 2 months ago showed mild anemia, normal peripheral smear.  Will recheck today along with ESR, sed rate.  Will refer to ENT for further evaluation.    Last Assessment & Plan:   Soft, right-sided postauricular mass, deferred intervention in 2018 at onset of myeloma diagnosis, gradually enlarging over time.  Had cons   Monoclonal gammopathy    Multiple myeloma ([place]) 03/20/2016   Last Assessment & Plan:  Given dx, rec social distancing, avoiding in person jury duty.  Letter provided Formatting of this note might be different from the original. Last Assessment & Plan:  Myeloma treatment is going well, he has upcoming oncology visit   Multiple myeloma in remission ([place]) 07/22/2016   Last Assessment & Plan:  Formatting  of this note might be different from the original.  Patient remains in remission, followed by oncology.  Had labs drawn today   Night sweats 01/15/2021   Last Assessment & Plan:   Formatting of this note might be different from the original.    With no signs of infection or new meds.  UTD with oncology as of last week.  Will check labs and CXR as noted.  Advised to let me know if new symptoms or persists, will consider blood and urine cultures.   Palliative care by specialist    Pancreatitis 03/02/2016   Peripheral neuropathy 07/22/2016   Last Assessment & Plan:  Overall improved. Patient has discontinued gabapentin, did not feel much difference  Feet and fingers.  Multifactorial, DM, Chemo, MM  Last Assessment & Plan:   Doing well on gabapentin Formatting of this note might be different from the original. Last Assessment & Plan:  Overall improved. Patient has discontinued gabapentin, did not feel much  difference   Physical deconditioning 06/13/2016   Last Assessment & Plan:  Marked weakness.  Will request further home assistance per home health with bathing.  Discharged on a supplement- patient reports is unaffordable.  Advised prefer food as no barriers, appetite reasonable, ok to use glucerna or carnation instant breakfast if desired.   Pre-operative clearance 01/30/2017   Protein-calorie malnutrition, severe 03/03/2016   Pulmonary embolism ([place]) 09/01/2021   Right hip pain 09/27/2019   Last Assessment & Plan:  Formatting of this note might be different from the original.  Recent tests reassuring for no malignancy.  known hip arthritis.  Symptoms today separate from neuropathy.  He plans on starting program with trainer at [facility], declined PT today for gait and strength.  Will reassess at followup.   Screening for malignant neoplasm of colon 05/02/2020   Sinus tachycardia 05/13/2016   SIRS (systemic inflammatory response syndrome) ([place]) 05/12/2016   Stage 3 chronic kidney disease ([place]) 06/13/2016   Last Assessment & Plan:  Will check Cr/K today  June 2020, annual follow-up [place] [facility]  Last Assessment & Plan:   Recent labs reviewed- stable Formatting of this note might be different from the original. Last Assessment & Plan:  Will check Cr/K today   Thrombocytopenia ([place]) 09/01/2021   Type 2 diabetes mellitus ([place]) 07/21/2011   Last Assessment & Plan:   Diabetes improved with fasting glucoses in a safe range.  Continue current regimen. Formatting of this note might be different from the original. Last Assessment & Plan:  Patient's diabetes has been well controlled at baseline on 12 units of Lantus daily.  He reports some hyperglycemia on the first few days after dexamethasone with resolution by the end of the week. We'll   Unsteady gait 10/21/2021   Last Assessment & Plan: Formatting of this note might be different from the original.  Will treat cerumen impaction and otorrhea, continue PT.   Likely multifactorial with peripheral neuropathy, BPPV playing a role.  Conitnue PT   Upper abdominal pain    Urinary retention    Vitamin D deficiency 05/02/2020   Weakness generalized     Past Surgical History:  Procedure Laterality Date   CARDIAC CATHETERIZATION  2006   CORONARY ANGIOPLASTY WITH STENT PLACEMENT  2007   IR FLUORO GUIDE CV LINE RIGHT  05/20/2016   IR US GUIDE VASC ACCESS RIGHT  05/20/2016    Current Medications: Current Meds  Medication Sig   amLODipine (NORVASC) 10 MG tablet Take 10 mg by mouth daily.  apixaban (ELIQUIS) 5 MG TABS tablet Take 1 tablet (5 mg total) by mouth 2 (two) times daily.   aspirin EC 81 MG tablet Take 1 tablet (81 mg total) by mouth daily. Swallow whole.   atorvastatin (LIPITOR) 20 MG tablet Take 1 tablet (20 mg total) by mouth daily.   Cholecalciferol 10 MCG (400 UNIT) CAPS Take 1 capsule by mouth daily.   gabapentin (NEURONTIN) 300 MG capsule Take 300 mg by mouth daily.   LANTUS SOLOSTAR 100 UNIT/ML Solostar Pen Inject 16 Units into the skin daily.   lisinopril (ZESTRIL) 2.5 MG tablet Take 2.5 mg by mouth daily.   metoprolol succinate (TOPROL-XL) 100 MG 24 hr tablet Take 100 mg by mouth daily.    nitroGLYCERIN (NITROSTAT) 0.4 MG SL tablet Place 1 tablet (0.4 mg total) under the tongue every 5 (five) minutes x 3 doses as needed for chest pain.   vitamin B-12 (CYANOCOBALAMIN) 1000 MCG tablet Take 1,000 mcg by mouth daily.     Allergies:   Patient has no known allergies.   Social History   Socioeconomic History   Marital status: Married    Spouse name: Not on file   Number of children: 6   Years of education: Not on file   Highest education level: Not on file  Occupational History   Occupation: retired Company secretary  Tobacco Use   Smoking status: Never   Smokeless tobacco: Never  Vaping Use   Vaping Use: Never used  Substance and Sexual Activity   Alcohol use: No   Drug use: No   Sexual activity: Not Currently  Other Topics  Concern   Not on file  Social History Narrative   Not on file   Social Determinants of Health   Financial Resource Strain: Not on file  Food Insecurity: Not on file  Transportation Needs: Not on file  Physical Activity: Not on file  Stress: Not on file  Social Connections: Not on file     Family History: The patient's family history includes Arrhythmia in his mother; Cancer in his brother; Diabetes in his brother, brother, and sister; Heart disease in his mother; Stroke in his father.  ROS:   Please see the history of present illness.    All other systems reviewed and are negative.  EKGs/Labs/Other Studies Reviewed:    The following studies were reviewed today: I discussed my findings with the patient at length   Recent Labs: 09/01/2021: B Natriuretic Peptide 167.7 10/08/2021: ALT 13; BUN 23; Creatinine 1.49; Hemoglobin 11.3; Platelet Count 191; Potassium 4.0; Sodium 136  Recent Lipid Panel    Component Value Date/Time   CHOL 134 04/03/2017 1125   TRIG 101 04/03/2017 1125   HDL 50 04/03/2017 1125   CHOLHDL 2.7 04/03/2017 1125   CHOLHDL 4 01/11/2013 1030   VLDL 16.8 01/11/2013 1030   LDLCALC 64 04/03/2017 1125    Physical Exam:    VS:  BP (!) 144/76   Pulse 74   Ht 5' 8.6" (1.742 m)   Wt 177 lb 1.9 oz (80.3 kg)   SpO2 99%   BMI 26.46 kg/m     Wt Readings from Last 3 Encounters:  11/07/21 177 lb 1.9 oz (80.3 kg)  09/01/21 169 lb 12.1 oz (77 kg)  07/17/21 171 lb (77.6 kg)     GEN: Patient is in no acute distress HEENT: Normal NECK: No JVD; No carotid bruits LYMPHATICS: No lymphadenopathy CARDIAC: Hear sounds regular, 2/6 systolic murmur at the apex. RESPIRATORY:  Clear to  auscultation without rales, wheezing or rhonchi  ABDOMEN: Soft, non-tender, non-distended MUSCULOSKELETAL:  No edema; No deformity  SKIN: Warm and dry NEUROLOGIC:  Alert and oriented x 3 PSYCHIATRIC:  Normal affect   Signed, Jenean Lindau, MD  11/07/2021 1:25 PM    [facility]

## 2021-11-07 NOTE — Patient Instructions (Addendum)
Medication Instructions:  Your physician has recommended you make the following change in your medication:   Start 81 mg coated aspirin daily.  *If you need a refill on your cardiac medications before your next appointment, please call your pharmacy*   Lab Work: None ordered If you have labs (blood work) drawn today and your tests are completely normal, you will receive your results only by: [place] (if you have MyChart) OR A paper copy in the mail If you have any lab test that is abnormal or we need to change your treatment, we will call you to review the results.   Testing/Procedures: None ordered   Follow-Up: At [facility], you and your health needs are our priority.  As part of our continuing mission to provide you with exceptional heart care, we have created designated Provider Care Teams.  These Care Teams include your primary Cardiologist (physician) and Advanced Practice Providers (APPs -  Physician Assistants and Nurse Practitioners) who all work together to provide you with the care you need, when you need it.  We recommend signing up for the patient portal called "MyChart".  Sign up information is provided on this After Visit Summary.  MyChart is used to connect with patients for Virtual Visits (Telemedicine).  Patients are able to view lab/test results, encounter notes, upcoming appointments, etc.  Non-urgent messages can be sent to your provider as well.   To learn more about what you can do with MyChart, go to [email or web address].    Your next appointment:   9 month(s)  The format for your next appointment:   In Person  Provider:   Jyl Heinz, MD   Other Instructions NA

## 2021-11-21 ENCOUNTER — Other Ambulatory Visit: Payer: Self-pay | Admitting: *Deleted

## 2021-11-21 DIAGNOSIS — M79605 Pain in left leg: Secondary | ICD-10-CM

## 2021-12-09 ENCOUNTER — Ambulatory Visit (INDEPENDENT_AMBULATORY_CARE_PROVIDER_SITE_OTHER)
Admission: RE | Admit: 2021-12-09 | Discharge: 2021-12-09 | Disposition: A | Discharge status: Home / Self Care | Payer: Medicare PPO | Source: Ambulatory Visit | Attending: Surgery | Admitting: Surgery

## 2021-12-09 ENCOUNTER — Ambulatory Visit: Payer: Medicare HMO | Admitting: Surgery

## 2021-12-09 ENCOUNTER — Encounter: Payer: Self-pay | Admitting: Surgery

## 2021-12-09 ENCOUNTER — Ambulatory Visit (HOSPITAL_COMMUNITY)
Admission: RE | Admit: 2021-12-09 | Discharge: 2021-12-09 | Disposition: A | Discharge status: Home / Self Care | Payer: Medicare PPO | Source: Ambulatory Visit | Attending: Surgery | Admitting: Surgery

## 2021-12-09 VITALS — BP 152/89 | HR 63 | Temp 97.7°F | Resp 20 | Ht 68.0 in | Wt 177.0 lb

## 2021-12-09 DIAGNOSIS — I70213 Atherosclerosis of native arteries of extremities with intermittent claudication, bilateral legs: Secondary | ICD-10-CM

## 2021-12-09 DIAGNOSIS — M79604 Pain in right leg: Secondary | ICD-10-CM

## 2021-12-09 DIAGNOSIS — M79605 Pain in left leg: Secondary | ICD-10-CM

## 2021-12-09 NOTE — Progress Notes (Signed)
[facility] [place]  Patient name: Douglas Rose. MRN: [id number] DOB: 31-Oct-1945 Sex: male   REASON FOR VISIT:    Follow up  HISOTRY OF PRESENT ILLNESS:    Douglas Rose. is a 76 y.o. male who I saw in the emergency department in August 2023.  He underwent a CT scan for pleuritic chest pain and was found to have a PE.  Ultrasound also showed thrombus in the left posterior tibial vein.  He was incidentally found to have a left superficial femoral artery occlusion.  He told me he could walk a fairly long distance but occasionally get left leg weakness.  He was started on a DOAC and fitted for compression stockings.  He was told to follow-up with me for claudication evaluation.  He continues to be without any significant symptoms.  He does not have any open wounds  The patient has a history of coronary artery disease, status post PCI. He has a diabetic. He takes a statin for hypercholesterolemia. He is a non-smoker.  He is managed for multiple myeloma which is in remission PAST MEDICAL HISTORY:   Past Medical History:  Diagnosis Date   Abdominal fullness 02/26/2016   Last Assessment & Plan:  overall improving with no pain, no further nausea and normal BM today.  No signs of obstruction or infection today.  Due for 10 year colonoscopy, mild anemia in December, will refer to GI   Acute renal failure ([place]) 03/06/2016   Acute renal failure superimposed on chronic kidney disease ([place]) 03/01/2016   AKI (acute kidney injury) ([place])    Arterial insufficiency of lower extremity ([place]) 09/01/2016   Overview:  Mild (2018)  Last Assessment & Plan:  Mild arterial insufficiency, he does not have classic symptoms of claudication. Advised daily foot checks and would consider further evaluation if new or worsening symptoms  Mild (2018)  Last Assessment & Plan:  asymptomatic.  We will follow-up as needed Formatting of this note might be  different from the original. Mild (2018)  Last Assessment & Pla   Atherosclerotic heart disease of native coronary artery without angina pectoris 05/02/2020   Benign hypertension 07/21/2011   Last Assessment & Plan:  Formatting of this note might be different from the original.  We discussed blood pressure above goal today. Previously well controlled. I recommended he continue current medications, focus on lifestyle modifications including low-sodium diet and will reassess in 3 months   Benign prostatic hyperplasia 05/02/2020   CAD (coronary artery disease) 2006   stent LAD 3.0x18 [place], occluded RCA   Cancer ([place])    multiple myeloma   Cerumen impaction 10/21/2021   Last Assessment & Plan: Formatting of this note might be different from the original.  Right cerumen impaction cleared with irrigation.  Left with yellow appearing discharge,  patient reports he also recently been flushing it at home. Patient denies any pain.  Concern for immunocompromised, chronic otorrhea, will rx ofloxacin drops per formualry   Cervical lymphadenopathy 03/31/2016   Colitis 05/12/2016   Coronary artery disease 07/21/2011   stent LAD 3.0x18 [place], occluded RCA  Overview:  Stent 2006 at [facility], Low risk stress test feb 2017  Last Assessment & Plan:  Declined further stress testing in 2015 due to cost and absence of symptoms.  Continues to be asymptomatic.  He has upcoming follow-up with cardiology for review. Encouraged to keep appt  Stent 2006 at [facility], Low risk s  Diabetes mellitus without complication ([place])    DNR (do not resuscitate) discussion    Elevated PSA 01/24/2015   Last Assessment & Plan: Formatting of this note might be different from the original.  Reports he has a pending biopsy which will be rescheduled due to recent DVT.  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  Patient is up-to-date on Covid booster   Infected tooth 03/31/2016   Leukocytosis    Lipoma 07/19/2014   Overview:  Right scalp.  Seen by Payton Mccallum in past  Last Assessment & Plan:  Lipoma on right scalp getting larger, will refer to ENT, but more concerned about lymph node   Lymphadenopathy 11/21/2015   Last Assessment & Plan:  Right submandibular lymph node, non-tender, now larger. CBC 2 months ago showed mild anemia, normal peripheral smear.  Will recheck today along with ESR, sed rate.  Will refer to ENT for further evaluation.    Last Assessment & Plan:   Soft, right-sided postauricular mass, deferred intervention in 2018 at onset of myeloma diagnosis, gradually enlarging over time.  Had cons   Monoclonal gammopathy    Multiple myeloma ([place]) 03/20/2016   Last Assessment & Plan:  Given dx, rec social distancing, avoiding in person jury duty.  Letter provided Formatting of this note might be different from the original. Last Assessment & Plan:  Myeloma treatment is going well, he has upcoming oncology visit   Multiple myeloma in remission ([place]) 07/22/2016   Last Assessment & Plan:  Formatting of this note might be different from the original.  Patient remains in remission, followed by oncology.  Had labs drawn today   Night sweats 01/15/2021   Last Assessment & Plan:   Formatting of this note might be different from the original.    With no signs of infection or new meds.  UTD with oncology as of last week.  Will check labs and CXR as noted.  Advised  to let me know if new symptoms or persists, will consider blood and urine cultures.   Palliative care by specialist    Pancreatitis 03/02/2016   Peripheral neuropathy 07/22/2016   Last Assessment & Plan:  Overall improved. Patient has discontinued gabapentin, did not feel much difference  Feet and fingers.  Multifactorial, DM, Chemo, MM  Last Assessment & Plan:   Doing well on gabapentin Formatting of this note might be different from the original. Last Assessment & Plan:  Overall improved. Patient has discontinued gabapentin, did not feel much difference   Physical deconditioning 06/13/2016   Last Assessment & Plan:  Marked weakness.  Will request further home assistance per home health with bathing.  Discharged on a supplement- patient reports is unaffordable.  Advised prefer food as no barriers, appetite reasonable, ok to use glucerna or carnation instant breakfast if desired.   Pre-operative clearance 01/30/2017   Protein-calorie malnutrition, severe 03/03/2016   Pulmonary embolism ([place]) 09/01/2021   Right hip pain 09/27/2019  Last Assessment & Plan:  Formatting of this note might be different from the original.  Recent tests reassuring for no malignancy.  known hip arthritis.  Symptoms today separate from neuropathy.  He plans on starting program with trainer [facility], declined PT today for gait and strength.  Will reassess at followup.   Screening for malignant neoplasm of colon 05/02/2020   Sinus tachycardia 05/13/2016   SIRS (systemic inflammatory response syndrome) ([place]) 05/12/2016   Stage 3 chronic kidney disease ([place]) 06/13/2016   Last Assessment & Plan:  Will check Cr/K today  June 2020, annual follow-up [place] [facility]  Last Assessment & Plan:   Recent labs reviewed- stable Formatting of this note might be different from the original. Last Assessment & Plan:  Will check Cr/K today   Thrombocytopenia ([place]) 09/01/2021   Type 2 diabetes mellitus ([place]) 07/21/2011   Last Assessment & Plan:    Diabetes improved with fasting glucoses in a safe range.  Continue current regimen. Formatting of this note might be different from the original. Last Assessment & Plan:  Patient's diabetes has been well controlled at baseline on 12 units of Lantus daily.  He reports some hyperglycemia on the first few days after dexamethasone with resolution by the end of the week. We'll   Unsteady gait 10/21/2021   Last Assessment & Plan: Formatting of this note might be different from the original.  Will treat cerumen impaction and otorrhea, continue PT.  Likely multifactorial with peripheral neuropathy, BPPV playing a role.  Conitnue PT   Upper abdominal pain    Urinary retention    Vitamin D deficiency 05/02/2020   Weakness generalized      FAMILY HISTORY:   Family History  Problem Relation Age of Onset   Arrhythmia Mother    Heart disease Mother    Diabetes Sister    Diabetes Brother    Cancer Brother    Stroke Father    Diabetes Brother     SOCIAL HISTORY:   Social History   Tobacco Use   Smoking status: Never   Smokeless tobacco: Never  Substance Use Topics   Alcohol use: No     ALLERGIES:   No Known Allergies   CURRENT MEDICATIONS:   Current Outpatient Medications  Medication Sig Dispense Refill   amLODipine (NORVASC) 10 MG tablet Take 10 mg by mouth daily.     apixaban (ELIQUIS) 5 MG TABS tablet Take 1 tablet (5 mg total) by mouth 2 (two) times daily. 60 tablet 2   aspirin EC 81 MG tablet Take 1 tablet (81 mg total) by mouth daily. Swallow whole. 90 tablet 3   atorvastatin (LIPITOR) 20 MG tablet Take 1 tablet (20 mg total) by mouth daily. 90 tablet 3   Cholecalciferol 10 MCG (400 UNIT) CAPS Take 1 capsule by mouth daily.     gabapentin (NEURONTIN) 300 MG capsule Take 300 mg by mouth daily.     LANTUS SOLOSTAR 100 UNIT/ML Solostar Pen Inject 16 Units into the skin daily.     lisinopril (ZESTRIL) 2.5 MG tablet Take 2.5 mg by mouth daily.     metoprolol succinate  (TOPROL-XL) 100 MG 24 hr tablet Take 100 mg by mouth daily.      nitroGLYCERIN (NITROSTAT) 0.4 MG SL tablet Place 1 tablet (0.4 mg total) under the tongue every 5 (five) minutes x 3 doses as needed for chest pain. 30 tablet 12   vitamin B-12 (CYANOCOBALAMIN) 1000 MCG tablet Take 1,000 mcg by mouth daily.  lenalidomide (REVLIMID) 10 MG capsule TAKE 1 CAPSULE BY MOUTH EVERY DAY FOR 21 DAYS THEN 7 DAYS OFF (Patient not taking: Reported on 12/09/2021) 21 capsule 0   No current facility-administered medications for this visit.    REVIEW OF SYSTEMS:   _0  denotes positive finding, _1  denotes negative finding Cardiac  Comments:  Chest pain or chest pressure:    Shortness of breath upon exertion:    Short of breath when lying flat:    Irregular heart rhythm:        Vascular    Pain in calf, thigh, or hip brought on by ambulation:    Pain in feet at night that wakes you up from your sleep:     Blood clot in your veins:    Leg swelling:         Pulmonary    Oxygen at home:    Productive cough:     Wheezing:         Neurologic    Sudden weakness in arms or legs:     Sudden numbness in arms or legs:     Sudden onset of difficulty speaking or slurred speech:    Temporary loss of vision in one eye:     Problems with dizziness:         Gastrointestinal    Blood in stool:     Vomited blood:         Genitourinary    Burning when urinating:     Blood in urine:        Psychiatric    Major depression:         Hematologic    Bleeding problems:    Problems with blood clotting too easily:        Skin    Rashes or ulcers:        Constitutional    Fever or chills:      PHYSICAL EXAM:   Vitals:   12/09/21 1201  BP: (!) 152/89  Pulse: 63  Resp: 20  Temp: 97.7 F (36.5 C)  SpO2: 99%  Weight: 177 lb (80.3 kg)  Height: _2  (1.727 m)    GENERAL: The patient is a well-nourished male, in no acute distress. The vital signs are documented above. CARDIAC: There is a regular  rate and rhythm.  VASCULAR: Nonpalpable pedal pulses PULMONARY: Non-labored respirations MUSCULOSKELETAL: There are no major deformities or cyanosis. NEUROLOGIC: No focal weakness or paresthesias are detected. SKIN: There are no ulcers or rashes noted. PSYCHIATRIC: The patient has a normal affect.  STUDIES:   I have reviewed the following: +-------+-----------+-----------+------------+------------+  ABI/TBIToday's ABIToday's TBIPrevious ABIPrevious TBI  +-------+-----------+-----------+------------+------------+  Right 0.79       0.60                                 +-------+-----------+-----------+------------+------------+  Left  0.91       0.55                                 +-------+-----------+-----------+------------+------------+   Right toe pressure: 96 Left toe pressure: 88  Right: 75-99% stenosis noted in the superficial femoral artery.   Left: Total occlusion noted in the superficial femoral artery.   MEDICAL ISSUES:   PAD: The patient has evidence of arterial disease in bilateral superficial femoral arteries.  It is occluded on the left  and has a high-grade stenosis on the right.  He remains asymptomatic.  Therefore no surgical intervention would be recommended.  He should continue taking an aspirin and a statin.  I will have him follow-up in 1 year for repeat ABIs.  He knows to contact me should he develop a nonhealing wound  DVT/PE: The patient is now on Eliquis.  He has a history of multiple myeloma.  He sees hematology for this.  Therefore, I will let them make the ultimate decision on when to stop his Eliquis or if he can be stopped, cutting back to a lower dose    Annamarie Major, IV, MD, FACS [facility] [phone] Pager [phone]

## 2021-12-20 ENCOUNTER — Other Ambulatory Visit: Payer: Self-pay | Admitting: Hematology

## 2022-01-13 ENCOUNTER — Other Ambulatory Visit: Payer: Self-pay

## 2022-01-13 DIAGNOSIS — C9001 Multiple myeloma in remission: Secondary | ICD-10-CM

## 2022-01-14 ENCOUNTER — Inpatient Hospital Stay: Payer: Medicare PPO | Attending: Hematology

## 2022-01-14 ENCOUNTER — Other Ambulatory Visit: Payer: Self-pay

## 2022-01-14 DIAGNOSIS — Z79899 Other long term (current) drug therapy: Secondary | ICD-10-CM | POA: Diagnosis not present

## 2022-01-14 DIAGNOSIS — Z7982 Long term (current) use of aspirin: Secondary | ICD-10-CM | POA: Insufficient documentation

## 2022-01-14 DIAGNOSIS — D649 Anemia, unspecified: Secondary | ICD-10-CM | POA: Insufficient documentation

## 2022-01-14 DIAGNOSIS — C9001 Multiple myeloma in remission: Secondary | ICD-10-CM | POA: Diagnosis not present

## 2022-01-14 DIAGNOSIS — E119 Type 2 diabetes mellitus without complications: Secondary | ICD-10-CM | POA: Diagnosis not present

## 2022-01-14 LAB — CMP (CANCER CENTER ONLY)
ALT: 31 U/L (ref 0–44)
AST: 19 U/L (ref 15–41)
Albumin: 3.6 g/dL (ref 3.5–5.0)
Alkaline Phosphatase: 86 U/L (ref 38–126)
Anion gap: 4 — ABNORMAL LOW (ref 5–15)
BUN: 16 mg/dL (ref 8–23)
CO2: 30 mmol/L (ref 22–32)
Calcium: 9.1 mg/dL (ref 8.9–10.3)
Chloride: 103 mmol/L (ref 98–111)
Creatinine: 1.64 mg/dL — ABNORMAL HIGH (ref 0.61–1.24)
GFR, Estimated: 43 mL/min — ABNORMAL LOW (ref >60)
Glucose, Bld: 205 mg/dL — ABNORMAL HIGH (ref 70–99)
Potassium: 3.9 mmol/L (ref 3.5–5.1)
Sodium: 137 mmol/L (ref 135–145)
Total Bilirubin: 0.4 mg/dL (ref 0.3–1.2)
Total Protein: 7.5 g/dL (ref 6.5–8.1)

## 2022-01-14 LAB — CBC WITH DIFFERENTIAL (CANCER CENTER ONLY)
Abs Immature Granulocytes: 0 10*3/uL (ref 0.00–0.07)
Basophils Absolute: 0 10*3/uL (ref 0.0–0.1)
Basophils Relative: 0 %
Eosinophils Absolute: 0.1 10*3/uL (ref 0.0–0.5)
Eosinophils Relative: 3 %
HCT: 37.4 % — ABNORMAL LOW (ref 39.0–52.0)
Hemoglobin: 12.3 g/dL — ABNORMAL LOW (ref 13.0–17.0)
Immature Granulocytes: 0 %
Lymphocytes Relative: 37 %
Lymphs Abs: 1.5 10*3/uL (ref 0.7–4.0)
MCH: 28.9 pg (ref 26.0–34.0)
MCHC: 32.9 g/dL (ref 30.0–36.0)
MCV: 87.8 fL (ref 80.0–100.0)
Monocytes Absolute: 0.5 10*3/uL (ref 0.1–1.0)
Monocytes Relative: 13 %
Neutro Abs: 1.9 10*3/uL (ref 1.7–7.7)
Neutrophils Relative %: 47 %
Platelet Count: 171 10*3/uL (ref 150–400)
RBC: 4.26 MIL/uL (ref 4.22–5.81)
RDW: 17 % — ABNORMAL HIGH (ref 11.5–15.5)
WBC Count: 4 10*3/uL (ref 4.0–10.5)
nRBC: 0 % (ref 0.0–0.2)

## 2022-01-15 LAB — KAPPA/LAMBDA LIGHT CHAINS
Kappa free light chain: 42.1 mg/L — ABNORMAL HIGH (ref 3.3–19.4)
Kappa, lambda light chain ratio: 1.85 — ABNORMAL HIGH (ref 0.26–1.65)
Lambda free light chains: 22.7 mg/L (ref 5.7–26.3)

## 2022-01-17 LAB — MULTIPLE MYELOMA PANEL, SERUM
Albumin SerPl Elph-Mcnc: 3.3 g/dL (ref 2.9–4.4)
Albumin/Glob SerPl: 1 (ref 0.7–1.7)
Alpha 1: 0.2 g/dL (ref 0.0–0.4)
Alpha2 Glob SerPl Elph-Mcnc: 0.7 g/dL (ref 0.4–1.0)
B-Globulin SerPl Elph-Mcnc: 0.9 g/dL (ref 0.7–1.3)
Gamma Glob SerPl Elph-Mcnc: 1.8 g/dL (ref 0.4–1.8)
Globulin, Total: 3.5 g/dL (ref 2.2–3.9)
IgA: 422 mg/dL (ref 61–437)
IgG (Immunoglobin G), Serum: 1813 mg/dL — ABNORMAL HIGH (ref 603–1613)
IgM (Immunoglobulin M), Srm: 52 mg/dL (ref 15–143)
Total Protein ELP: 6.8 g/dL (ref 6.0–8.5)

## 2022-01-21 ENCOUNTER — Inpatient Hospital Stay (HOSPITAL_BASED_OUTPATIENT_CLINIC_OR_DEPARTMENT_OTHER): Payer: Medicare PPO | Admitting: Hematology

## 2022-01-21 ENCOUNTER — Other Ambulatory Visit: Payer: Self-pay

## 2022-01-21 VITALS — BP 157/80 | HR 66 | Temp 97.0°F | Resp 18 | Wt 181.7 lb

## 2022-01-21 DIAGNOSIS — R221 Localized swelling, mass and lump, neck: Secondary | ICD-10-CM | POA: Diagnosis not present

## 2022-01-21 DIAGNOSIS — C9001 Multiple myeloma in remission: Secondary | ICD-10-CM | POA: Diagnosis not present

## 2022-01-21 DIAGNOSIS — D0439 Carcinoma in situ of skin of other parts of face: Secondary | ICD-10-CM

## 2022-01-21 NOTE — Progress Notes (Signed)
[name]  HEMATOLOGY ONCOLOGY PHONE VISIT NOTE  Date of service: 01/21/22   Patient Care Team: [name], MD as PCP - General (Family Medicine) Brunetta Genera, MD as Consulting Physician (Hematology)  Chief Complaint : F/u for continued evaluation management of multiple myeloma  Diagnosis:  IgG Kappa multiple myeloma    Current Treatment:  Revlimid maintenance   INTERVAL HISTORY:  Douglas Rose. is here for continued evaluation and management of his multiple myeloma and pulmonary embolism.  Patient was last seen by me on 10/18/2021 and he complained of intermittent dizziness with certain head positions, but was doing well overall. He did report that he has been off Revlimid since he was diagnosed with Pulmonary embolism.   Patient notes he has been doing well without any new medical concerns since our last visit. He has been taking Eliquis 5 mg twice daily without any toxicities. He denies fever, chills, night sweats, abdominal pain, chest pain, leg pain, leg cramping, leg weakness, or leg swelling. Patient does complain of feeling heaviness at bilateral foot which radiates to his knee. He notes that the heaviness improves when he raises his leg.   Patient complains of a small skin lesion/bump located neat his forehead. He notes that the lesion has been slightly growing in size, but denies of it bleeding. He denies the lesion being painful upon touching.   He also reports of mildly enlarged nodule near his right neck,but denies it being painful. We will get an ultrasound.   Patient is also taking aspirin.     REVIEW OF SYSTEMS:   10 Point review of Systems was done is negative except as noted above.   Past Medical History:  Diagnosis Date   Abdominal fullness 02/26/2016   Last Assessment & Plan:  overall improving with no pain, no further nausea and normal BM today.  No signs of obstruction or infection today.  Due for 10 year colonoscopy, mild anemia in December,  will refer to GI   Acute renal failure ([place]) 03/06/2016   Acute renal failure superimposed on chronic kidney disease ([place]) 03/01/2016   AKI (acute kidney injury) ([place])    Arterial insufficiency of lower extremity ([place]) 09/01/2016   Overview:  Mild (2018)  Last Assessment & Plan:  Mild arterial insufficiency, he does not have classic symptoms of claudication. Advised daily foot checks and would consider further evaluation if new or worsening symptoms  Mild (2018)  Last Assessment & Plan:  asymptomatic.  We will follow-up as needed Formatting of this note might be different from the original. Mild (2018)  Last Assessment & Pla   Atherosclerotic heart disease of native coronary artery without angina pectoris 05/02/2020   Benign hypertension 07/21/2011   Last Assessment & Plan:  Formatting of this note might be different from the original.  We discussed blood pressure above goal today. Previously well controlled. I recommended he continue current medications, focus on lifestyle modifications including low-sodium diet and will reassess in 3 months   Benign prostatic hyperplasia 05/02/2020   CAD (coronary artery disease) 2006   stent LAD 3.0x18 Cypher, occluded RCA   Cancer ([place])    multiple myeloma   Cerumen impaction 10/21/2021   Last Assessment & Plan: Formatting of this note might be different from the original.  Right cerumen impaction cleared with irrigation.  Left with yellow appearing discharge,  patient reports he also recently been flushing it at home. Patient denies any pain.  Concern for immunocompromised, chronic otorrhea, will rx ofloxacin  drops per formualry   Cervical lymphadenopathy 03/31/2016   Colitis 05/12/2016   Coronary artery disease 07/21/2011   stent LAD 3.0x18 Cypher, occluded RCA  Overview:  Stent 2006 at [facility], Low risk stress test feb 2017  Last Assessment & Plan:  Declined further stress testing in 2015 due to cost and absence of symptoms.   Continues to be asymptomatic.  He has upcoming follow-up with cardiology for review. Encouraged to keep appt  Stent 2006 at [facility], Low risk s   Diabetes mellitus without complication [facility])    DNR (do not resuscitate) discussion    Elevated PSA 01/24/2015   Last Assessment & Plan: Formatting of this note might be different from the original.  Reports he has a pending biopsy which will be rescheduled due to recent DVT.  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  Patient is up-to-date on Covid booster   Infected tooth 03/31/2016   Leukocytosis    Lipoma 07/19/2014   Overview:  Right scalp.  Seen by Payton Mccallum in past  Last Assessment & Plan:  Lipoma on right scalp getting larger, will refer to ENT, but more concerned about lymph node   Lymphadenopathy 11/21/2015   Last Assessment & Plan:  Right submandibular lymph node, non-tender, now larger. CBC 2 months ago showed mild anemia, normal peripheral smear.  Will recheck today along with ESR, sed rate.  Will refer to ENT for further evaluation.    Last Assessment & Plan:   Soft, right-sided postauricular mass, deferred intervention in 2018 at onset of myeloma diagnosis, gradually enlarging over time.  Had cons   Monoclonal gammopathy    Multiple myeloma ([place]) 03/20/2016   Last Assessment & Plan:  Given dx, rec social distancing, avoiding in person jury duty.  Letter provided Formatting of this note might  be different from the original. Last Assessment & Plan:  Myeloma treatment is going well, he has upcoming oncology visit   Multiple myeloma in remission ([place]) 07/22/2016   Last Assessment & Plan:  Formatting of this note might be different from the original.  Patient remains in remission, followed by oncology.  Had labs drawn today   Night sweats 01/15/2021   Last Assessment & Plan:   Formatting of this note might be different from the original.    With no signs of infection or new meds.  UTD with oncology as of last week.  Will check labs and CXR as noted.  Advised to let me know if new symptoms or persists, will consider blood and urine cultures.   Palliative care by specialist    Pancreatitis 03/02/2016   Peripheral neuropathy 07/22/2016   Last Assessment & Plan:  Overall improved. Patient has discontinued gabapentin, did not feel much difference  Feet and fingers.  Multifactorial, DM, Chemo, MM  Last Assessment & Plan:   Doing well on gabapentin Formatting of this note might be different from the original. Last  Assessment & Plan:  Overall improved. Patient has discontinued gabapentin, did not feel much difference   Physical deconditioning 06/13/2016   Last Assessment & Plan:  Marked weakness.  Will request further home assistance per home health with bathing.  Discharged on a supplement- patient reports is unaffordable.  Advised prefer food as no barriers, appetite reasonable, ok to use glucerna or carnation instant breakfast if desired.   Pre-operative clearance 01/30/2017   Protein-calorie malnutrition, severe 03/03/2016   Pulmonary embolism ([place]) 09/01/2021   Right hip pain 09/27/2019   Last Assessment & Plan:  Formatting of this note might be different from the original.  Recent tests reassuring for no malignancy.  known hip arthritis.  Symptoms today separate from neuropathy.  He plans on starting program with trainer at [facility], declined PT today for gait and strength.  Will reassess at  followup.   Screening for malignant neoplasm of colon 05/02/2020   Sinus tachycardia 05/13/2016   SIRS (systemic inflammatory response syndrome) ([place]) 05/12/2016   Stage 3 chronic kidney disease ([place]) 06/13/2016   Last Assessment & Plan:  Will check Cr/K today  June 2020, annual follow-up [place] [facility]  Last Assessment & Plan:   Recent labs reviewed- stable Formatting of this note might be different from the original. Last Assessment & Plan:  Will check Cr/K today   Thrombocytopenia ([place]) 09/01/2021   Type 2 diabetes mellitus ([place]) 07/21/2011   Last Assessment & Plan:   Diabetes improved with fasting glucoses in a safe range.  Continue current regimen. Formatting of this note might be different from the original. Last Assessment & Plan:  Patient's diabetes has been well controlled at baseline on 12 units of Lantus daily.  He reports some hyperglycemia on the first few days after dexamethasone with resolution by the end of the week. We'll   Unsteady gait 10/21/2021   Last Assessment & Plan: Formatting of this note might be different from the original.  Will treat cerumen impaction and otorrhea, continue PT.  Likely multifactorial with peripheral neuropathy, BPPV playing a role.  Conitnue PT   Upper abdominal pain    Urinary retention    Vitamin D deficiency 05/02/2020   Weakness generalized     . Past Surgical History:  Procedure Laterality Date   CARDIAC CATHETERIZATION  2006   CORONARY ANGIOPLASTY WITH STENT PLACEMENT  2007   IR FLUORO GUIDE CV LINE RIGHT  05/20/2016   IR US GUIDE VASC ACCESS RIGHT  05/20/2016    . Social History   Tobacco Use   Smoking status: Never   Smokeless tobacco: Never  Vaping Use   Vaping Use: Never used  Substance Use Topics   Alcohol use: No   Drug use: No    ALLERGIES:  has No Known Allergies.  MEDICATIONS:  Current Outpatient Medications  Medication Sig Dispense Refill   amLODipine (NORVASC) 10 MG tablet Take 10 mg by mouth daily.      aspirin EC 81 MG tablet Take 1 tablet (81 mg total) by mouth daily. Swallow whole. 90 tablet 3   atorvastatin (LIPITOR) 20 MG tablet Take 1 tablet (20 mg total) by mouth daily. 90 tablet 3   Cholecalciferol 10 MCG (400 UNIT) CAPS Take 1 capsule by mouth daily.     ELIQUIS 5 MG TABS tablet Take 1 tablet by mouth twice daily 60 tablet 0   gabapentin (NEURONTIN) 300 MG capsule Take 300 mg by mouth daily.     LANTUS SOLOSTAR 100 UNIT/ML Solostar Pen Inject 16  Units into the skin daily.     lenalidomide (REVLIMID) 10 MG capsule TAKE 1 CAPSULE BY MOUTH EVERY DAY FOR 21 DAYS THEN 7 DAYS OFF (Patient not taking: Reported on 12/09/2021) 21 capsule 0   lisinopril (ZESTRIL) 2.5 MG tablet Take 2.5 mg by mouth daily.     metoprolol succinate (TOPROL-XL) 100 MG 24 hr tablet Take 100 mg by mouth daily.      nitroGLYCERIN (NITROSTAT) 0.4 MG SL tablet Place 1 tablet (0.4 mg total) under the tongue every 5 (five) minutes x 3 doses as needed for chest pain. 30 tablet 12   vitamin B-12 (CYANOCOBALAMIN) 1000 MCG tablet Take 1,000 mcg by mouth daily.     No current facility-administered medications for this visit.    PHYSICAL EXAMINATION: Vital signs reviewed GENERAL:alert, in no acute distress and comfortable SKIN: no acute rashes, no significant lesions EYES: conjunctiva are pink and non-injected, sclera anicteric OROPHARYNX: MMM, no exudates, no oropharyngeal erythema or ulceration NECK: supple, no JVD LYMPH:  no palpable lymphadenopathy in the cervical, axillary or inguinal regions LUNGS: clear to auscultation b/l with normal respiratory effort HEART: regular rate & rhythm ABDOMEN:  normoactive bowel sounds , non tender, not distended. Extremity: no pedal edema PSYCH: alert & oriented x 3 with fluent speech NEURO: no focal motor/sensory deficits   LABORATORY DATA:   I have reviewed the data as listed     Latest Ref Rng & Units 01/14/2022   12:34 PM 10/08/2021    1:04 PM 09/02/2021    8:26 AM  CBC   WBC 4.0 - 10.5 K/uL 4.0  4.8  4.6   Hemoglobin 13.0 - 17.0 g/dL 12.3  11.3  9.9   Hematocrit 39.0 - 52.0 % 37.4  34.8  29.9   Platelets 150 - 400 K/uL 171  191  111   ANC 1k  .    Latest Ref Rng & Units 01/14/2022   12:34 PM 10/08/2021    1:04 PM 09/02/2021    8:26 AM  CMP  Glucose 70 - 99 mg/dL 205  228  175   BUN 8 - 23 mg/dL '16  23  16   '$ Creatinine 0.61 - 1.24 mg/dL 1.64  1.49  1.61   Sodium 135 - 145 mmol/L 137  136  132   Potassium 3.5 - 5.1 mmol/L 3.9  4.0  3.3   Chloride 98 - 111 mmol/L 103  105  101   CO2 22 - 32 mmol/L '30  28  24   '$ Calcium 8.9 - 10.3 mg/dL 9.1  8.8  7.9   Total Protein 6.5 - 8.1 g/dL 7.5  7.4    Total Bilirubin 0.3 - 1.2 mg/dL 0.4  0.4    Alkaline Phos 38 - 126 U/L 86  94    AST 15 - 41 U/L 19  12    ALT 0 - 44 U/L 31  13         RADIOGRAPHIC STUDIES: I have personally reviewed the radiological images as listed and agreed with the findings in the report. No results found.  ASSESSMENT & PLAN:   77 y.o.  with multiple medical comorbidities but fairly good performance status overall with   1) ISS Stage II IgG K[place] multiple myeloma. Anemia + Renal insufficiency.  Bone survey neg for concerning bone lesions. significantly elevated kappa free light chains 858, lambda 18.4, with a ratio of 46.65 . He was also noted to have an M spike of 1.7 g/dL with IFE  showing IgG kappa monoclonal protein. Quantitative IgG level was increased to nearly 2500 mg/dL. 24-hour UPEP showed monoclonal protein of 1300 mg per 24 hours which constituted about 86% of all the urinary protein. Bone marrow biopsy showed 20-30% Restricted plasma cells consistent with multiple myeloma Cytogenetics/FISH  Showed +4, +11, and +17   Patient has been treated with CyBord X 2 cycles . Treatment interrupted due to severe c diff colitis with prolonged hospitalization and decline in performance status.  Patient is much improved and back to baseline now. M protein undetected with neg  IFE Has been treated with 5 cycles of Rd  Had been on maintenance Revlikmid for more than 2 years until PEs in August 2023.  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 Had an element of obstructive uropathy due to BPH which has resolved. Creatinine continues to be stable.   3) Normocytic anemia likely related to multiple myeloma.-stable. No abdominal pain or discomfort over the right upper quadrant. No fevers or chills.  #4 right lower and middle lobe pulmonary embolism August 2023-Revlimid possible provoking factor. currently off Revlimid. 4) DM2 - controlled -continue management per PC   5) Concern for skin cancer lesion on forehead PLAN:  -discussed lab results from today, 01/14/2022, with the patient. CBC shows hemoglobin of 12.3 and hematocrit of 37.4. CMP shows creatinine of 1.64. -Myeloma panel from 01/14/2022 which does not show any abnormal protein. Lambda free light chain is 22.7. -Answered all of patient's questions.  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  -Schedule ultrasound of neck for the mildly enlarged nodule on his right neck. -Patient has no obvious lab or clinical evidence of myeloma progression at this time. -recommended Influenza vaccine, COVID-19 Booster, and RSV vaccine. Recommended to stay up to date with all other age appropriate vaccine.  FOLLOW-UP: [name] rt neck LN  Referral to Dr Audelia Hives /Plastic surgery for resection of forehead lesion concerning for SCC/BCC. RTC with Dr Irene Limbo in 2 months with labs 1 week prior to clinic visit  The total time spent in the appointment was 30 minutes* .  All of the patient's questions were answered with apparent satisfaction. The patient knows to call the clinic with any problems, questions or concerns.   Sullivan Lone MD MS AAHIVMS [facility] Hematology/Oncology Physician [facility]  .*Total Encounter Time as defined by the Centers for Medicare and Medicaid Services includes, in addition to the face-to-face time of a patient visit (documented in the note above) non-face-to-face time: obtaining and reviewing outside history, ordering and reviewing medications, tests or procedures, care coordination (communications with other health care professionals or caregivers) and documentation in the medical record.   I, Cleda Mccreedy, am acting as a Education administrator for Sullivan Lone, MD. .I have reviewed the above documentation for accuracy and completeness, and I agree with the above. Brunetta Genera MD

## 2022-02-10 ENCOUNTER — Other Ambulatory Visit: Payer: Self-pay | Admitting: Hematology

## 2022-02-13 LAB — HM DIABETES EYE EXAM

## 2022-02-18 ENCOUNTER — Ambulatory Visit (HOSPITAL_COMMUNITY)
Admission: RE | Admit: 2022-02-18 | Discharge: 2022-02-18 | Disposition: A | Discharge status: Home / Self Care | Payer: Medicare PPO | Source: Ambulatory Visit | Attending: Hematology | Admitting: Hematology

## 2022-02-18 DIAGNOSIS — R221 Localized swelling, mass and lump, neck: Secondary | ICD-10-CM | POA: Diagnosis present

## 2022-02-19 ENCOUNTER — Ambulatory Visit: Payer: Medicare PPO | Admitting: Plastic Surgery

## 2022-02-19 ENCOUNTER — Encounter: Payer: Self-pay | Admitting: Plastic Surgery

## 2022-02-19 VITALS — HR 70 | Ht 68.5 in | Wt 182.0 lb

## 2022-02-19 DIAGNOSIS — L989 Disorder of the skin and subcutaneous tissue, unspecified: Secondary | ICD-10-CM

## 2022-02-19 NOTE — Progress Notes (Signed)
Referring Provider [name], MD [place],  [place] [address]   CC:  Chief Complaint  Patient presents with   Consult      Douglas Rose. is an 77 y.o. male.  HPI: [name] is a very pleasant 77 year old male who is referred for evaluation of a skin lesion on the right side of his forehead.  He states that this lesion has been present for several months but is clearly been growing in size over that period of time.  He is requesting removal of the skin lesion.  No Known Allergies  Outpatient Encounter Medications as of 02/19/2022  Medication Sig   amLODipine (NORVASC) 10 MG tablet Take 10 mg by mouth daily.   aspirin EC 81 MG tablet Take 1 tablet (81 mg total) by mouth daily. Swallow whole.   atorvastatin (LIPITOR) 20 MG tablet Take 1 tablet (20 mg total) by mouth daily.   Cholecalciferol 10 MCG (400 UNIT) CAPS Take 1 capsule by mouth daily.   ELIQUIS 5 MG TABS tablet Take 1 tablet by mouth twice daily   gabapentin (NEURONTIN) 300 MG capsule Take 300 mg by mouth daily.   LANTUS SOLOSTAR 100 UNIT/ML Solostar Pen Inject 16 Units into the skin daily.   lisinopril (ZESTRIL) 2.5 MG tablet Take 2.5 mg by mouth daily.   metoprolol succinate (TOPROL-XL) 100 MG 24 hr tablet Take 100 mg by mouth daily.    nitroGLYCERIN (NITROSTAT) 0.4 MG SL tablet Place 1 tablet (0.4 mg total) under the tongue every 5 (five) minutes x 3 doses as needed for chest pain.   vitamin B-12 (CYANOCOBALAMIN) 1000 MCG tablet Take 1,000 mcg by mouth daily.   [DISCONTINUED] lenalidomide (REVLIMID) 10 MG capsule TAKE 1 CAPSULE BY MOUTH EVERY DAY FOR 21 DAYS THEN 7 DAYS OFF (Patient not taking: Reported on 12/09/2021)   No facility-administered encounter medications on file as of 02/19/2022.     Past Medical History:  Diagnosis Date   Abdominal fullness 02/26/2016   Last Assessment & Plan:  overall improving with no pain, no further nausea and normal BM today.  No signs of  obstruction or infection today.  Due for 10 year colonoscopy, mild anemia in December, will refer to GI   Acute renal failure ([place]) 03/06/2016   Acute renal failure superimposed on chronic kidney disease ([place]) 03/01/2016   AKI (acute kidney injury) ([place])    Arterial insufficiency of lower extremity ([place]) 09/01/2016   Overview:  Mild (2018)  Last Assessment & Plan:  Mild arterial insufficiency, he does not have classic symptoms of claudication. Advised daily foot checks and would consider further evaluation if new or worsening symptoms  Mild (2018)  Last Assessment & Plan:  asymptomatic.  We will follow-up as needed Formatting of this note might be different from the original. Mild (2018)  Last Assessment & Pla   Atherosclerotic heart disease of native coronary artery without angina pectoris 05/02/2020   Benign hypertension 07/21/2011   Last Assessment & Plan:  Formatting of this note might be different from the original.  We discussed blood pressure above goal today. Previously well controlled. I recommended he continue current medications, focus on lifestyle modifications including low-sodium diet and will reassess in 3 months   Benign prostatic hyperplasia 05/02/2020   CAD (coronary artery disease) 2006   stent LAD 3.0x18 [place], occluded RCA   Cancer ([place])    multiple myeloma   Cerumen impaction 10/21/2021   Last Assessment & Plan:  Formatting of this note might be different from the original.  Right cerumen impaction cleared with irrigation.  Left with yellow appearing discharge,  patient reports he also recently been flushing it at home. Patient denies any pain.  Concern for immunocompromised, chronic otorrhea, will rx ofloxacin drops per formualry   Cervical lymphadenopathy 03/31/2016   Colitis 05/12/2016   Coronary artery disease 07/21/2011   stent LAD 3.0x18 [place], occluded RCA  Overview:  Stent 2006 at [facility], Low risk stress test feb 2017  Last Assessment &  Plan:  Declined further stress testing in 2015 due to cost and absence of symptoms.  Continues to be asymptomatic.  He has upcoming follow-up with cardiology for review. Encouraged to keep appt  Stent 2006 at [facility], Low risk s   Diabetes mellitus without complication [facility])    DNR (do not resuscitate) discussion    Elevated PSA 01/24/2015   Last Assessment & Plan: Formatting of this note might be different from the original.  Reports he has a pending biopsy which will be rescheduled due to recent DVT.  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  Patient is up-to-date on Covid booster   Infected tooth 03/31/2016   Leukocytosis    Lipoma 07/19/2014   Overview:  Right scalp.  Seen by Payton Mccallum in past  Last Assessment & Plan:  Lipoma on right scalp getting larger, will refer to ENT, but more concerned about lymph node   Lymphadenopathy 11/21/2015   Last Assessment & Plan:  Right submandibular lymph node, non-tender, now larger. CBC 2 months ago showed mild anemia, normal peripheral smear.  Will recheck today along with ESR, sed rate.  Will refer to ENT for further evaluation.    Last Assessment & Plan:   Soft, right-sided postauricular mass, deferred intervention in 2018 at onset of myeloma diagnosis, gradually enlarging over time.  Had cons   Monoclonal gammopathy    Multiple myeloma ([place]) 03/20/2016   Last Assessment & Plan:  Given dx, rec social  distancing, avoiding in person jury duty.  Letter provided Formatting of this note might be different from the original. Last Assessment & Plan:  Myeloma treatment is going well, he has upcoming oncology visit   Multiple myeloma in remission ([place]) 07/22/2016   Last Assessment & Plan:  Formatting of this note might be different from the original.  Patient remains in remission, followed by oncology.  Had labs drawn today   Night sweats 01/15/2021   Last Assessment & Plan:   Formatting of this note might be different from the original.    With no signs of infection or new meds.  UTD with oncology as of last week.  Will check labs and CXR as noted.  Advised to let me know if new symptoms or persists, will consider blood and urine cultures.   Palliative care by specialist    Pancreatitis 03/02/2016   Peripheral neuropathy 07/22/2016  Last Assessment & Plan:  Overall improved. Patient has discontinued gabapentin, did not feel much difference  Feet and fingers.  Multifactorial, DM, Chemo, MM  Last Assessment & Plan:   Doing well on gabapentin Formatting of this note might be different from the original. Last Assessment & Plan:  Overall improved. Patient has discontinued gabapentin, did not feel much difference   Physical deconditioning 06/13/2016   Last Assessment & Plan:  Marked weakness.  Will request further home assistance per home health with bathing.  Discharged on a supplement- patient reports is unaffordable.  Advised prefer food as no barriers, appetite reasonable, ok to use glucerna or carnation instant breakfast if desired.   Pre-operative clearance 01/30/2017   Protein-calorie malnutrition, severe 03/03/2016   Pulmonary embolism ([place]) 09/01/2021   Right hip pain 09/27/2019   Last Assessment & Plan:  Formatting of this note might be different from the original.  Recent tests reassuring for no malignancy.  known hip arthritis.  Symptoms today separate from neuropathy.  He plans on starting program  with trainer at [facility], declined PT today for gait and strength.  Will reassess at followup.   Screening for malignant neoplasm of colon 05/02/2020   Sinus tachycardia 05/13/2016   SIRS (systemic inflammatory response syndrome) ([place]) 05/12/2016   Stage 3 chronic kidney disease ([place]) 06/13/2016   Last Assessment & Plan:  Will check Cr/K today  June 2020, annual follow-up [place] [facility]  Last Assessment & Plan:   Recent labs reviewed- stable Formatting of this note might be different from the original. Last Assessment & Plan:  Will check Cr/K today   Thrombocytopenia ([place]) 09/01/2021   Type 2 diabetes mellitus ([place]) 07/21/2011   Last Assessment & Plan:   Diabetes improved with fasting glucoses in a safe range.  Continue current regimen. Formatting of this note might be different from the original. Last Assessment & Plan:  Patient's diabetes has been well controlled at baseline on 12 units of Lantus daily.  He reports some hyperglycemia on the first few days after dexamethasone with resolution by the end of the week. We'll   Unsteady gait 10/21/2021   Last Assessment & Plan: Formatting of this note might be different from the original.  Will treat cerumen impaction and otorrhea, continue PT.  Likely multifactorial with peripheral neuropathy, BPPV playing a role.  Conitnue PT   Upper abdominal pain    Urinary retention    Vitamin D deficiency 05/02/2020   Weakness generalized     Past Surgical History:  Procedure Laterality Date   CARDIAC CATHETERIZATION  2006   CORONARY ANGIOPLASTY WITH STENT PLACEMENT  2007   IR FLUORO GUIDE CV LINE RIGHT  05/20/2016   IR US GUIDE VASC ACCESS RIGHT  05/20/2016    Family History  Problem Relation Age of Onset   Arrhythmia Mother    Heart disease Mother    Diabetes Sister    Diabetes Brother    Cancer Brother    Stroke Father    Diabetes Brother     Social History   Social History Narrative   Not on file     Review of Systems General: Denies  fevers, chills, weight loss CV: Denies chest pain, shortness of breath, palpitations Skin: 5 mm skin lesion which the patient says has been present for several months he denies any bleeding or pain associated with the lesion  Physical Exam    02/19/2022   10:05 AM 01/21/2022    1:15 PM 12/09/2021   12:01 PM  Vitals with BMI  Height 5' 8.5"  5' 8"$   Weight 182 lbs 181 lbs 11 oz 177 lbs  BMI 123XX123  99991111  Systolic  [id number]  Diastolic  80 89  Pulse 70 66 63    General:  No acute distress,  Alert and oriented, Non-Toxic, Normal speech and affect [place]: 5 mm lesion on the superior aspect of the right forehead.  Assessment/Plan Skin lesion: The appearance of the skin lesion is not particularly worrisome however the rapid growth is concerning and I believe this should be removed sooner than later.  We discussed the procedure which will be a local excision in the office.  The patient is on both Eliquis and aspirin so that he is a slightly higher risk for bleeding postoperatively he and I discussed this and the fact that he may very well require pressure on the wound postoperatively to stop any bleeding.  Given the small size of the excision I will not request that he stops his Eliquis prior to the procedure.  He understands there will be a scar postoperatively.  Camillia Herter 02/19/2022, 10:17 AM

## 2022-02-22 NOTE — Telephone Encounter (Signed)
Formatting of this note might be different from the original.  Wife calling ; hit his head  on car door . Area bled slightly not bleeding anymore . .blister size of a raisin .  Same area where his skin cancer was dx . Advised to clean with soap and water . Apply antibiotic ointment . And monitor F/u with plastic surgeon on Monday when office opens   Reason for Disposition   Minor cut or scratch    Protocols used: Cuts and Lacerations-A-AH    Electronically signed by Brayton Mars, RN at 02/22/2022  4:47 PM EST

## 2022-02-22 NOTE — Telephone Encounter (Signed)
Formatting of this note might be different from the original.  Wife calling back to confirm Neosporin / antibiotic ointment . Instructed can be any brand . To prevent infection .   Electronically signed by Brayton Mars, RN at 02/22/2022  5:26 PM EST

## 2022-02-26 ENCOUNTER — Inpatient Hospital Stay
Admit: 2022-02-26 | Discharge: 2022-02-26 | Disposition: A | Discharge status: Home / Self Care | Payer: MEDICARE | Attending: Student in an Organized Health Care Education/Training Program

## 2022-02-26 ENCOUNTER — Telehealth: Payer: Self-pay | Admitting: *Deleted

## 2022-02-26 DIAGNOSIS — R21 Rash and other nonspecific skin eruption: Secondary | ICD-10-CM

## 2022-02-26 NOTE — Telephone Encounter (Signed)
Spoke to pt's wife Arnetta. She states prior to today pt nicked the lesion on his forehead with the car door and it bled. She called her PCP who advised them to wash it with soap and water and use antibiotic ointment. She states today the bleeding is controlled and pt has no "fever". She asked if Dr. Lovena Le needed to see pt since pt had consult with him for last week for this lesion to be removed. Advised her to have patient follow up with his PCP and keep 03/26/22 appt with Dr. Lovena Le unless directed otherwise by his doctor. She verbalized understanding.

## 2022-02-26 NOTE — ED Notes (Signed)
Discharge instructions given to patient; denies any questions.

## 2022-02-26 NOTE — ED Provider Notes (Signed)
[facility] EMERGENCY DEPT  EMERGENCY DEPARTMENT ENCOUNTER      Pt Name: David Cabrera.  MRN: [id number]  Birthdate 10-10-1945  Date of evaluation: 02/26/2022  Provider: Evelena Asa, MD    CHIEF COMPLAINT       Chief Complaint   Patient presents with    skin issue         HISTORY OF PRESENT ILLNESS   (Location/Symptom, Timing/Onset, Context/Setting, Quality, Duration, Modifying Factors, Severity)  Note limiting factors.   HPI    77 year old male with a history of CKD, diabetes, hypertension, hyperlipidemia presenting for skin concern.  Reports that he has a growth on his forehead that he is scheduled to have removed by plastic surgeon on March 20.  Reports that in the meantime he bent over and bumped it and it bled on Saturday, he washed it and put on Neosporin, it has gotten itchiness around it and some small bumps.  Reports the rash has not spread, he has tried to avoid itching it.  The bleeding has stopped.  No other injuries, no redness, warmth, or pain.    Review of External Medical Records:         Nursing Notes were reviewed.      REVIEW OF SYSTEMS    (2-9 systems for level 4, 10 or more for level 5)     Except as noted above the remainder of the review of systems was reviewed and negative.       PAST MEDICAL HISTORY     Past Medical History:   Diagnosis Date    Cancer [facility])     "multiple Myloma    Chronic kidney disease     Diabetes mellitus ([place])     type 2    DVT (deep venous thrombosis) ([facility])     Hyperlipidemia     Hypertension     Neuropathy     PE (pulmonary thromboembolism) ([place])          SURGICAL HISTORY       Past Surgical History:   Procedure Laterality Date    CORONARY ANGIOPLASTY WITH STENT PLACEMENT      1 stent    CT BONE MARROW BIOPSY  03/13/2016    CT BONE MARROW BIOPSY 03/13/2016    CT NEEDLE BIOPSY SPINE  03/13/2016    CT NEEDLE BIOPSY SPINE 03/13/2016         CURRENT MEDICATIONS       Previous Medications    No medications on file       ALLERGIES     Patient has no known allergies.    FAMILY  HISTORY     History reviewed. No pertinent family history.       SOCIAL HISTORY       Social History     Socioeconomic History    Marital status: Married     Spouse name: None    Number of children: None    Years of education: None    Highest education level: None   Tobacco Use    Smoking status: Never    Smokeless tobacco: Never   Vaping Use    Vaping Use: Never used   Substance and Sexual Activity    Alcohol use: Not Currently    Drug use: Never           PHYSICAL EXAM    (up to 7 for level 4, 8 or more for level 5)     ED Triage Vitals [02/26/22 1445]  BP Temp Temp Source Pulse Respirations SpO2 Height Weight - Scale   (!) 175/83 97 F (36.1 C) Temporal 63 16 98 % 1.727 m (5' 8"$ ) 82.1 kg (181 lb)       Body mass index is 27.52 kg/m.    Physical Exam  Vitals reviewed.   Constitutional:       General: He is not in acute distress.     Appearance: Normal appearance. He is not toxic-appearing.   HENT:      Head: Normocephalic and atraumatic.      Nose: Nose normal.      Mouth/Throat:      Mouth: Mucous membranes are moist.      Pharynx: Oropharynx is clear.   Eyes:      Conjunctiva/sclera: Conjunctivae normal.   Cardiovascular:      Rate and Rhythm: Normal rate.      Heart sounds: Normal heart sounds.   Pulmonary:      Effort: Pulmonary effort is normal.      Breath sounds: Normal breath sounds.   Abdominal:      General: Abdomen is flat.      Palpations: Abdomen is soft.   Musculoskeletal:      Cervical back: Neck supple.   Skin:     General: Skin is warm and dry.      Findings: Lesion present.   Neurological:      Mental Status: He is alert. Mental status is at baseline.   Psychiatric:         Mood and Affect: Mood normal.         Behavior: Behavior normal.          Media Information      Document Information    Wound Care Image: Wound   Forehead   02/26/2022 15:27   Attached To:   Hospital Encounter on 02/26/22   Source Information    Evelena Asa, MD  [facility]       DIAGNOSTIC RESULTS     EKG: All  EKG's are interpreted by the Emergency Department Physician who either signs or Co-signs this chart in the absence of a cardiologist.        RADIOLOGY:   Non-plain film images such as CT, Ultrasound and MRI are read by the radiologist. Plain radiographic images are visualized and preliminarily interpreted by the emergency physician as documented in ED course.      Interpretation per the Radiologist below, if available at the time of this note:    No orders to display        LABS:  Labs Reviewed - No data to display    All other labs were within normal range or not returned as of this dictation.    EMERGENCY DEPARTMENT COURSE and DIFFERENTIAL DIAGNOSIS/MDM:   Vitals:    Vitals:    02/26/22 1445   BP: (!) 175/83   Pulse: 63   Resp: 16   Temp: 97 F (36.1 C)   TempSrc: Temporal   SpO2: 98%   Weight: 82.1 kg (181 lb)   Height: 1.727 m (5' 8"$ )           Medical Decision Making  Patient presenting for evaluation of itchy rash around scalp growth after recent injury and apply Neosporin  Small ring of raised bumps around growth, patient reports these are itchy, likely allergic reaction to Neosporin or soap used to clean the wound  No warmth, no tenderness, no redness around the wound  do not suspect cellulitis or abscess at this time  Rash does not appear to be shingles  Advised patient to take Benadryl as needed for itching, keep wound clean with hypoallergenic soap and water only  Advised to contact his plastic surgeon for close follow-up and keep his appointment to have his growth removed  Strict return precautions provided.  No further emergent intervention indicated at this time                REASSESSMENT            CONSULTS:  None    PROCEDURES:  Unless otherwise noted below, none     Procedures          FINAL IMPRESSION      1. Rash            DISPOSITION/PLAN   DISPOSITION Decision To Discharge 02/26/2022 03:33:44 PM      PATIENT REFERRED TO:  Your PCP    Call in 2 days  As needed      DISCHARGE MEDICATIONS:  New  Prescriptions    No medications on file         (Please note that portions of this note were completed with a voice recognition program.  Efforts were made to edit the dictations but occasionally words are mis-transcribed.)    Evelena Asa, MD (electronically signed)  Emergency Attending Physician               Evelena Asa, MD  02/26/22 1534

## 2022-02-26 NOTE — Discharge Instructions (Addendum)
You are seen today for evaluation of skin issue, your rash is likely allergic, take Benadryl as needed for itching.  Avoid any fragrance containing soaps.  Clean gently with hypoallergenic soap and warm water only.  Follow-up with your surgeon as previously recommended and call your PCP for any further concerns.  Thank you for letting us take care of you, hope you feel better soon,  Roosevelt Locks MD

## 2022-02-26 NOTE — ED Triage Notes (Signed)
Pt ambulated to the treatment area with a steady gait accompanied by his daughter and wife. Pt states "I have a growth on my right fore head that I am schedule to have it lasered off by a plastic surgeon March 20th, but last Saturday I was reaching into the car and bent down and hit the growth and it bled. We called the doctor they said to wash it and apply Neosporin which we did but now it looks like its breaking out with little bumps that just came up yesterday. I'm from [place] and I just want it looked at to see if I need to get in with my plastic surgeon sooner.

## 2022-02-26 NOTE — Telephone Encounter (Signed)
Formatting of this note might be different from the original.  Spoke to pt's wife Arnetta. She states prior to today pt nicked the lesion on his forehead with the car door and it bled. She called her PCP who advised them to wash it with soap and water and use antibiotic ointment. She states today the bleeding is controlled and pt has no "fever". She asked if Dr. Lovena Le needed to see pt since pt had consult with him for last week for this lesion to be removed. Advised her to have patient follow up with his PCP and keep 03/26/22 appt with Dr. Lovena Le unless directed otherwise by his doctor. She verbalized understanding.  Electronically signed by Lilli Light, RN at 02/26/2022 10:20 AM EST

## 2022-03-13 ENCOUNTER — Other Ambulatory Visit: Payer: Self-pay | Admitting: Hematology

## 2022-03-18 ENCOUNTER — Other Ambulatory Visit: Payer: Self-pay

## 2022-03-18 ENCOUNTER — Telehealth: Payer: Self-pay | Admitting: *Deleted

## 2022-03-18 ENCOUNTER — Inpatient Hospital Stay: Payer: Medicare PPO | Attending: Hematology

## 2022-03-18 ENCOUNTER — Telehealth: Payer: Self-pay | Admitting: Plastic Surgery

## 2022-03-18 DIAGNOSIS — D649 Anemia, unspecified: Secondary | ICD-10-CM | POA: Insufficient documentation

## 2022-03-18 DIAGNOSIS — C9001 Multiple myeloma in remission: Secondary | ICD-10-CM | POA: Diagnosis present

## 2022-03-18 DIAGNOSIS — N289 Disorder of kidney and ureter, unspecified: Secondary | ICD-10-CM | POA: Insufficient documentation

## 2022-03-18 DIAGNOSIS — Z86711 Personal history of pulmonary embolism: Secondary | ICD-10-CM | POA: Diagnosis present

## 2022-03-18 DIAGNOSIS — C9000 Multiple myeloma not having achieved remission: Secondary | ICD-10-CM | POA: Insufficient documentation

## 2022-03-18 DIAGNOSIS — E119 Type 2 diabetes mellitus without complications: Secondary | ICD-10-CM | POA: Diagnosis not present

## 2022-03-18 DIAGNOSIS — Z79899 Other long term (current) drug therapy: Secondary | ICD-10-CM | POA: Insufficient documentation

## 2022-03-18 DIAGNOSIS — Z7901 Long term (current) use of anticoagulants: Secondary | ICD-10-CM | POA: Diagnosis not present

## 2022-03-18 DIAGNOSIS — Z7982 Long term (current) use of aspirin: Secondary | ICD-10-CM | POA: Insufficient documentation

## 2022-03-18 LAB — CBC WITH DIFFERENTIAL (CANCER CENTER ONLY)
Abs Immature Granulocytes: 0.01 10*3/uL (ref 0.00–0.07)
Basophils Absolute: 0 10*3/uL (ref 0.0–0.1)
Basophils Relative: 0 %
Eosinophils Absolute: 0.1 10*3/uL (ref 0.0–0.5)
Eosinophils Relative: 2 %
HCT: 38.4 % — ABNORMAL LOW (ref 39.0–52.0)
Hemoglobin: 12.9 g/dL — ABNORMAL LOW (ref 13.0–17.0)
Immature Granulocytes: 0 %
Lymphocytes Relative: 34 %
Lymphs Abs: 1.1 10*3/uL (ref 0.7–4.0)
MCH: 29.3 pg (ref 26.0–34.0)
MCHC: 33.6 g/dL (ref 30.0–36.0)
MCV: 87.1 fL (ref 80.0–100.0)
Monocytes Absolute: 0.4 10*3/uL (ref 0.1–1.0)
Monocytes Relative: 12 %
Neutro Abs: 1.6 10*3/uL — ABNORMAL LOW (ref 1.7–7.7)
Neutrophils Relative %: 52 %
Platelet Count: 173 10*3/uL (ref 150–400)
RBC: 4.41 MIL/uL (ref 4.22–5.81)
RDW: 15.8 % — ABNORMAL HIGH (ref 11.5–15.5)
WBC Count: 3.2 10*3/uL — ABNORMAL LOW (ref 4.0–10.5)
nRBC: 0 % (ref 0.0–0.2)

## 2022-03-18 LAB — CMP (CANCER CENTER ONLY)
ALT: 11 U/L (ref 0–44)
AST: 13 U/L — ABNORMAL LOW (ref 15–41)
Albumin: 3.8 g/dL (ref 3.5–5.0)
Alkaline Phosphatase: 73 U/L (ref 38–126)
Anion gap: 6 (ref 5–15)
BUN: 26 mg/dL — ABNORMAL HIGH (ref 8–23)
CO2: 25 mmol/L (ref 22–32)
Calcium: 8.9 mg/dL (ref 8.9–10.3)
Chloride: 106 mmol/L (ref 98–111)
Creatinine: 1.62 mg/dL — ABNORMAL HIGH (ref 0.61–1.24)
GFR, Estimated: 43 mL/min — ABNORMAL LOW (ref >60)
Glucose, Bld: 171 mg/dL — ABNORMAL HIGH (ref 70–99)
Potassium: 4 mmol/L (ref 3.5–5.1)
Sodium: 137 mmol/L (ref 135–145)
Total Bilirubin: 0.4 mg/dL (ref 0.3–1.2)
Total Protein: 7.7 g/dL (ref 6.5–8.1)

## 2022-03-18 NOTE — Telephone Encounter (Signed)
Returned call to pt's wife Arnetta regarding question about holding Eliquis. Discussed with Magda Paganini. Per Dr. Tanna Furry consult note Eliquis does not need to be held. NA/LVM to call office.

## 2022-03-18 NOTE — Telephone Encounter (Signed)
Call received from pt's wife. She states pt has an appt in office with Dr. Lovena Le on 03/26/22 for lesion removal. She asked if pt needs to stop his aspirin or Eliquis prior to the procedure. Please advise CB# [phone]

## 2022-03-18 NOTE — Telephone Encounter (Signed)
Wife called and said that he is on Eliquis and was wondering if he needed to be off the medication before his procedure.

## 2022-03-18 NOTE — Telephone Encounter (Signed)
I was able to speak with [name] and let her know Dr. Lovena Le has not asked for any of Douglas Rose medications to be held before his procedure. Advised her if I received different information before his appointment I would call and update her. She was present with [name] at the time of my call and verbalized understanding.

## 2022-03-19 LAB — KAPPA/LAMBDA LIGHT CHAINS
Kappa free light chain: 37.8 mg/L — ABNORMAL HIGH (ref 3.3–19.4)
Kappa, lambda light chain ratio: 1.74 — ABNORMAL HIGH (ref 0.26–1.65)
Lambda free light chains: 21.7 mg/L (ref 5.7–26.3)

## 2022-03-24 LAB — MULTIPLE MYELOMA PANEL, SERUM
Albumin SerPl Elph-Mcnc: 3.5 g/dL (ref 2.9–4.4)
Albumin/Glob SerPl: 1 (ref 0.7–1.7)
Alpha 1: 0.2 g/dL (ref 0.0–0.4)
Alpha2 Glob SerPl Elph-Mcnc: 0.7 g/dL (ref 0.4–1.0)
B-Globulin SerPl Elph-Mcnc: 1 g/dL (ref 0.7–1.3)
Gamma Glob SerPl Elph-Mcnc: 1.7 g/dL (ref 0.4–1.8)
Globulin, Total: 3.6 g/dL (ref 2.2–3.9)
IgA: 380 mg/dL (ref 61–437)
IgG (Immunoglobin G), Serum: 1778 mg/dL — ABNORMAL HIGH (ref 603–1613)
IgM (Immunoglobulin M), Srm: 44 mg/dL (ref 15–143)
Total Protein ELP: 7.1 g/dL (ref 6.0–8.5)

## 2022-03-25 ENCOUNTER — Inpatient Hospital Stay: Payer: Medicare PPO | Admitting: Hematology

## 2022-03-25 ENCOUNTER — Other Ambulatory Visit: Payer: Self-pay

## 2022-03-25 VITALS — BP 140/74 | HR 70 | Temp 97.6°F | Resp 17 | Ht 68.5 in | Wt 180.8 lb

## 2022-03-25 DIAGNOSIS — C9000 Multiple myeloma not having achieved remission: Secondary | ICD-10-CM | POA: Diagnosis not present

## 2022-03-25 DIAGNOSIS — C9001 Multiple myeloma in remission: Secondary | ICD-10-CM

## 2022-03-25 DIAGNOSIS — R590 Localized enlarged lymph nodes: Secondary | ICD-10-CM

## 2022-03-25 DIAGNOSIS — D0439 Carcinoma in situ of skin of other parts of face: Secondary | ICD-10-CM | POA: Diagnosis not present

## 2022-03-25 NOTE — Progress Notes (Signed)
[name]  HEMATOLOGY ONCOLOGY CLINIC NOTE  Date of service: 03/25/22   Patient Care Team: [name], MD as PCP - General (Family Medicine) Brunetta Genera, MD as Consulting Physician (Hematology)  Chief Complaint : F/u for continued evaluation management of multiple myeloma  Diagnosis:  IgG Kappa multiple myeloma   Current Treatment:  Revlimid maintenance   INTERVAL HISTORY:  Douglas Rose. is here for continued evaluation and management of his multiple myeloma and pulmonary embolism.  Patient was last seen by me on 01/21/2022 and he complained of heaviness at bilateral foot which radiates to his knee, small skin lesions/bump located near his forehead, and mildly enlarged nodule near his right neck.   Patient is accompanied by his wife during this visit. He reports he has been doing better overall without any new medical concerns. He still has skin lesion/bump near his forehead. He has been to his Psychiatric nurse and has scheduled the patient to surgically remove his forehead skin lesion. Patient still has mildly enlarged nodule near his right sided neck, which has not changed in the last year.   He notes he has been eating well and staying hydrated.   He denies fever, chills, night sweats, infection issues, abdominal pain, back pain, chest pain, or leg swelling. He denies any medication changes.   Patient still complains of bilateral leg neuropathy. He notes that his blood glucose levels have been stable at home.   REVIEW OF SYSTEMS:   10 Point review of Systems was done is negative except as noted above.   Past Medical History:  Diagnosis Date   Abdominal fullness 02/26/2016   Last Assessment & Plan:  overall improving with no pain, no further nausea and normal BM today.  No signs of obstruction or infection today.  Due for 10 year colonoscopy, mild anemia in December, will refer to GI   Acute renal failure ([place]) 03/06/2016   Acute renal failure superimposed on  chronic kidney disease ([place]) 03/01/2016   AKI (acute kidney injury) ([place])    Arterial insufficiency of lower extremity ([place]) 09/01/2016   Overview:  Mild (2018)  Last Assessment & Plan:  Mild arterial insufficiency, he does not have classic symptoms of claudication. Advised daily foot checks and would consider further evaluation if new or worsening symptoms  Mild (2018)  Last Assessment & Plan:  asymptomatic.  We will follow-up as needed Formatting of this note might be different from the original. Mild (2018)  Last Assessment & Pla   Atherosclerotic heart disease of native coronary artery without angina pectoris 05/02/2020   Benign hypertension 07/21/2011   Last Assessment & Plan:  Formatting of this note might be different from the original.  We discussed blood pressure above goal today. Previously well controlled. I recommended he continue current medications, focus on lifestyle modifications including low-sodium diet and will reassess in 3 months   Benign prostatic hyperplasia 05/02/2020   CAD (coronary artery disease) 2006   stent LAD 3.0x18 [place], occluded RCA   Cancer ([place])    multiple myeloma   Cerumen impaction 10/21/2021   Last Assessment & Plan: Formatting of this note might be different from the original.  Right cerumen impaction cleared with irrigation.  Left with yellow appearing discharge,  patient reports he also recently been flushing it at home. Patient denies any pain.  Concern for immunocompromised, chronic otorrhea, will rx ofloxacin drops per formualry   Cervical lymphadenopathy 03/31/2016   Colitis 05/12/2016   Coronary artery disease 07/21/2011  stent LAD 3.0x18 Cypher, occluded RCA  Overview:  Stent 2006 at [facility], Low risk stress test feb 2017  Last Assessment & Plan:  Declined further stress testing in 2015 due to cost and absence of symptoms.  Continues to be asymptomatic.  He has upcoming follow-up with cardiology for review. Encouraged to  keep appt  Stent 2006 at [facility], Low risk s   Diabetes mellitus without complication [facility])    DNR (do not resuscitate) discussion    Elevated PSA 01/24/2015   Last Assessment & Plan: Formatting of this note might be different from the original.  Reports he has a pending biopsy which will be rescheduled due to recent DVT.  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  Patient is up-to-date on Covid booster   Infected tooth 03/31/2016   Leukocytosis    Lipoma 07/19/2014   Overview:  Right scalp.  Seen by Payton Mccallum in past  Last Assessment & Plan:  Lipoma on right scalp getting larger, will refer to ENT, but more concerned about lymph node   Lymphadenopathy 11/21/2015   Last Assessment & Plan:  Right submandibular lymph node, non-tender, now larger. CBC 2 months ago showed mild anemia, normal peripheral smear.  Will recheck today along with ESR, sed rate.  Will refer to ENT for further evaluation.    Last Assessment & Plan:   Soft, right-sided postauricular mass, deferred intervention in 2018 at onset of myeloma diagnosis, gradually enlarging over time.  Had cons   Monoclonal gammopathy    Multiple myeloma ([place]) 03/20/2016   Last Assessment & Plan:  Given dx, rec social distancing, avoiding in person jury duty.  Letter provided Formatting of this note might be different from the original. Last Assessment & Plan:  Myeloma treatment is going well, he has  upcoming oncology visit   Multiple myeloma in remission ([place]) 07/22/2016   Last Assessment & Plan:  Formatting of this note might be different from the original.  Patient remains in remission, followed by oncology.  Had labs drawn today   Night sweats 01/15/2021   Last Assessment & Plan:   Formatting of this note might be different from the original.    With no signs of infection or new meds.  UTD with oncology as of last week.  Will check labs and CXR as noted.  Advised to let me know if new symptoms or persists, will consider blood and urine cultures.   Palliative care by specialist    Pancreatitis 03/02/2016   Peripheral neuropathy 07/22/2016   Last Assessment & Plan:  Overall improved. Patient has discontinued gabapentin, did not feel much difference  Feet and fingers.  Multifactorial, DM, Chemo, MM  Last Assessment & Plan:   Doing well on gabapentin Formatting of this note might be different from the original. Last Assessment & Plan:  Overall improved. Patient has discontinued gabapentin, did not feel much difference   Physical deconditioning 06/13/2016  Last Assessment & Plan:  Marked weakness.  Will request further home assistance per home health with bathing.  Discharged on a supplement- patient reports is unaffordable.  Advised prefer food as no barriers, appetite reasonable, ok to use glucerna or carnation instant breakfast if desired.   Pre-operative clearance 01/30/2017   Protein-calorie malnutrition, severe 03/03/2016   Pulmonary embolism ([place]) 09/01/2021   Right hip pain 09/27/2019   Last Assessment & Plan:  Formatting of this note might be different from the original.  Recent tests reassuring for no malignancy.  known hip arthritis.  Symptoms today separate from neuropathy.  He plans on starting program with trainer at [facility], declined PT today for gait and strength.  Will reassess at followup.   Screening for malignant neoplasm of colon 05/02/2020   Sinus tachycardia 05/13/2016    SIRS (systemic inflammatory response syndrome) ([place]) 05/12/2016   Stage 3 chronic kidney disease ([place]) 06/13/2016   Last Assessment & Plan:  Will check Cr/K today  June 2020, annual follow-up [place] [facility]  Last Assessment & Plan:   Recent labs reviewed- stable Formatting of this note might be different from the original. Last Assessment & Plan:  Will check Cr/K today   Thrombocytopenia ([place]) 09/01/2021   Type 2 diabetes mellitus ([place]) 07/21/2011   Last Assessment & Plan:   Diabetes improved with fasting glucoses in a safe range.  Continue current regimen. Formatting of this note might be different from the original. Last Assessment & Plan:  Patient's diabetes has been well controlled at baseline on 12 units of Lantus daily.  He reports some hyperglycemia on the first few days after dexamethasone with resolution by the end of the week. We'll   Unsteady gait 10/21/2021   Last Assessment & Plan: Formatting of this note might be different from the original.  Will treat cerumen impaction and otorrhea, continue PT.  Likely multifactorial with peripheral neuropathy, BPPV playing a role.  Conitnue PT   Upper abdominal pain    Urinary retention    Vitamin D deficiency 05/02/2020   Weakness generalized     . Past Surgical History:  Procedure Laterality Date   CARDIAC CATHETERIZATION  2006   CORONARY ANGIOPLASTY WITH STENT PLACEMENT  2007   IR FLUORO GUIDE CV LINE RIGHT  05/20/2016   IR US GUIDE VASC ACCESS RIGHT  05/20/2016    . Social History   Tobacco Use   Smoking status: Never   Smokeless tobacco: Never  Vaping Use   Vaping Use: Never used  Substance Use Topics   Alcohol use: No   Drug use: No    ALLERGIES:  has No Known Allergies.  MEDICATIONS:  Current Outpatient Medications  Medication Sig Dispense Refill   amLODipine (NORVASC) 10 MG tablet Take 10 mg by mouth daily.     aspirin EC 81 MG tablet Take 1 tablet (81 mg total) by mouth daily. Swallow whole. 90 tablet 3    atorvastatin (LIPITOR) 20 MG tablet Take 1 tablet (20 mg total) by mouth daily. 90 tablet 3   Cholecalciferol 10 MCG (400 UNIT) CAPS Take 1 capsule by mouth daily.     ELIQUIS 5 MG TABS tablet Take 1 tablet by mouth twice daily 60 tablet 0   gabapentin (NEURONTIN) 300 MG capsule Take 300 mg by mouth daily.     LANTUS SOLOSTAR 100 UNIT/ML Solostar Pen Inject 16 Units into the skin daily.     lisinopril (ZESTRIL) 2.5 MG tablet Take 2.5 mg by mouth daily.  metoprolol succinate (TOPROL-XL) 100 MG 24 hr tablet Take 100 mg by mouth daily.      nitroGLYCERIN (NITROSTAT) 0.4 MG SL tablet Place 1 tablet (0.4 mg total) under the tongue every 5 (five) minutes x 3 doses as needed for chest pain. 30 tablet 12   vitamin B-12 (CYANOCOBALAMIN) 1000 MCG tablet Take 1,000 mcg by mouth daily.     No current facility-administered medications for this visit.    PHYSICAL EXAMINATION: .BP (!) 140/74 (BP Location: Left Arm, Patient Position: Sitting)   Pulse 70   Temp 97.6 F (36.4 C) (Temporal)   Resp 17   Ht 5' 8.5" (1.74 m)   Wt 180 lb 12.8 oz (82 kg)   SpO2 100%   BMI 27.09 kg/m   GENERAL:alert, in no acute distress and comfortable SKIN: no acute rashes, no significant lesions EYES: conjunctiva are pink and non-injected, sclera anicteric OROPHARYNX: MMM, no exudates, no oropharyngeal erythema or ulceration NECK: supple, no JVD LYMPH:  no palpable lymphadenopathy in the cervical, axillary or inguinal regions LUNGS: clear to auscultation b/l with normal respiratory effort HEART: regular rate & rhythm ABDOMEN:  normoactive bowel sounds , non tender, not distended. Extremity: no pedal edema PSYCH: alert & oriented x 3 with fluent speech NEURO: no focal motor/sensory deficits   LABORATORY DATA:   I have reviewed the data as listed     Latest Ref Rng & Units 03/18/2022    1:02 PM 01/14/2022   12:34 PM 10/08/2021    1:04 PM  CBC  WBC 4.0 - 10.5 K/uL 3.2  4.0  4.8   Hemoglobin 13.0 - 17.0 g/dL  12.9  12.3  11.3   Hematocrit 39.0 - 52.0 % 38.4  37.4  34.8   Platelets 150 - 400 K/uL 173  171  191   ANC 1k     Latest Ref Rng & Units 03/18/2022    1:02 PM 01/14/2022   12:34 PM 10/08/2021    1:04 PM  CMP  Glucose 70 - 99 mg/dL 171  205  228   BUN 8 - 23 mg/dL 26  16  23    Creatinine 0.61 - 1.24 mg/dL 1.62  1.64  1.49   Sodium 135 - 145 mmol/L 137  137  136   Potassium 3.5 - 5.1 mmol/L 4.0  3.9  4.0   Chloride 98 - 111 mmol/L 106  103  105   CO2 22 - 32 mmol/L 25  30  28    Calcium 8.9 - 10.3 mg/dL 8.9  9.1  8.8   Total Protein 6.5 - 8.1 g/dL 7.7  7.5  7.4   Total Bilirubin 0.3 - 1.2 mg/dL 0.4  0.4  0.4   Alkaline Phos 38 - 126 U/L 73  86  94   AST 15 - 41 U/L 13  19  12    ALT 0 - 44 U/L 11  31  13         RADIOGRAPHIC STUDIES: I have personally reviewed the radiological images as listed and agreed with the findings in the report. No results found.  ASSESSMENT & PLAN:   77 y.o.  with multiple medical comorbidities but fairly good performance status overall with   1) ISS Stage II IgG Kapppa multiple myeloma. Anemia + Renal insufficiency.  Bone survey neg for concerning bone lesions. significantly elevated kappa free light chains 858, lambda 18.4, with a ratio of 46.65 . He was also noted to have an M spike of 1.7 g/dL with IFE  showing IgG kappa monoclonal protein. Quantitative IgG level was increased to nearly 2500 mg/dL. 24-hour UPEP showed monoclonal protein of 1300 mg per 24 hours which constituted about 86% of all the urinary protein. Bone marrow biopsy showed 20-30% Restricted plasma cells consistent with multiple myeloma Cytogenetics/FISH  Showed +4, +11, and +17   Patient has been treated with CyBord X 2 cycles . Treatment interrupted due to severe c diff colitis with prolonged hospitalization and decline in performance status.  Patient is much improved and back to baseline now. M protein undetected with neg IFE Has been treated with 5 cycles of Rd  Had been on  maintenance Revlikmid for more than 2 years until PEs in August 2023.  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 Had an element of obstructive uropathy due to BPH which has resolved. Creatinine continues to be stable.   3) Normocytic anemia likely related to multiple myeloma.-stable. No abdominal pain or discomfort over the right upper quadrant. No fevers or chills.  #4 right lower and middle lobe pulmonary embolism August 2023-Revlimid possible provoking factor. currently off Revlimid. 4) DM2 - controlled -continue management per PC   5) Concern for skin cancer lesion on forehead  PLAN:  -discussed lab results from 03/18/2022 with the patient. CBC showed slightly decreased WBC at 3.2 K, decreased hemoglobin at 12.9, decreased hematocrit of 38.4%. CMP shows slightly increased Creatinine at 1.62. Overall, labs are stable with chronic kidney disease.  -discussed the Kappa lambda light chain results from 03/18/2022 which showed stable kappa/lambda chain ratio at 1.74.  -Discussed Multiple Myeloma Panel results from 03/18/2022 with the patient which did not show M-protein level.  -Discussed the option of needle biopsy of his enlarged nodule near his neck. Patient agrees.  -Answered all of patient's questions.  -Patient has no obvious lab or clinical evidence of myeloma progression at this time.   FOLLOW-UP: US guided biopsy of rt cervical Lymphnode in 2-3 weeks Labs in 10 weeks RTC with Dr Irene Limbo in 12 weeks  The total time spent in the appointment was 23 minutes* .  All of the patient's questions were answered with apparent satisfaction. The patient knows to call the clinic with any problems, questions or concerns.   Sullivan Lone MD MS AAHIVMS [facility] Hematology/Oncology Physician [facility]  .*Total Encounter Time as defined by the Centers for Medicare and Medicaid Services includes, in addition to the face-to-face time of a  patient visit (documented in the note above) non-face-to-face time: obtaining and reviewing outside history, ordering and reviewing medications, tests or procedures, care coordination (communications with other health care professionals or caregivers) and documentation in the medical record.   I, Cleda Mccreedy, am acting as a Education administrator for Sullivan Lone, MD.  .I have reviewed the above documentation for accuracy and completeness, and I agree with the above. Brunetta Genera MD

## 2022-03-26 ENCOUNTER — Encounter: Payer: Self-pay | Admitting: Plastic Surgery

## 2022-03-26 ENCOUNTER — Ambulatory Visit: Payer: Medicare PPO | Admitting: Plastic Surgery

## 2022-03-26 VITALS — BP 145/82 | HR 68

## 2022-03-26 DIAGNOSIS — D485 Neoplasm of uncertain behavior of skin: Secondary | ICD-10-CM

## 2022-03-26 DIAGNOSIS — L989 Disorder of the skin and subcutaneous tissue, unspecified: Secondary | ICD-10-CM

## 2022-03-26 NOTE — Progress Notes (Unsigned)
Douglas Daft, MD  Donita Brooks D PROCEDURE / BIOPSY REVIEW Date: 03/25/22  Requested Biopsy site: Right neck Reason for request: Evaluate for neoplasm Imaging review: Best seen on Neck CT 01/03/20 and US neck 02/18/22  Decision: Approved Imaging modality to perform: Ultrasound Schedule with: Patient preference (Local vs Mod Sed) Schedule for: Any VIR  Additional comments: @VIR : None @Schedulers . None  Please contact me with questions, concerns, or if issue pertaining to this request arise.  Burman Riis, MD [facility] Radiology   Suttle, Rosanne Ashing, MD  Donita Brooks D PROCEDURE / BIOPSY REVIEW Date: 03/25/22  Requested Biopsy site: Right cervical lymph node Reason for request: assess for lymphoma Imaging review: Best seen on [name] 02/18/22  Decision: Approved Imaging modality to perform: Ultrasound Schedule with: No sedation / Local anesthetic Schedule for: Any VIR  Additional comments: none  Please contact me with questions, concerns, or if issue pertaining to this request arise.  Suzette Battiest, MD Vascular and Interventional Radiology Specialists [facility] Radiology

## 2022-03-26 NOTE — Progress Notes (Signed)
Procedure Note  Preoperative Dx: Skin lesion, forehead  Postoperative Dx: Same  Procedure: Excision of skin lesion  Anesthesia: Lidocaine 1% with 1:100,000 epinephrine and 0.25% Sensorcaine   Indication for Procedure: Removal of skin lesion for pathological diagnosis  Description of Procedure: Risks and complications were explained to the patient including the need for additional surgeries based on pathology..  Consent was confirmed and the patient understands the risks and benefits.  The potential complications and alternatives were explained and the patient consents.  The patient expressed understanding the option of not having the procedure and the risks of a scar.  Time out was called and all information was confirmed to be correct.    The area was prepped and drapped.  Local anesthetic was injected in the subcutaneous tissues.  After waiting for the local to take affect the lesion was excised through an elliptical incision.  After obtaining hemostasis, the surgical wound was closed with interrupted 5-0 Prolene sutures.  The surgical wound measured 5 mm.  A pressure dressing was applied.  The patient was given instructions on how to care for the area and a follow up appointment.  Thoms tolerated the procedure well and there were no complications. The specimen was sent to pathology.

## 2022-03-31 ENCOUNTER — Encounter: Payer: Self-pay | Admitting: Physician Assistant

## 2022-03-31 ENCOUNTER — Ambulatory Visit (INDEPENDENT_AMBULATORY_CARE_PROVIDER_SITE_OTHER): Payer: Self-pay | Admitting: Physician Assistant

## 2022-03-31 VITALS — BP 167/82 | HR 64

## 2022-03-31 DIAGNOSIS — L989 Disorder of the skin and subcutaneous tissue, unspecified: Secondary | ICD-10-CM

## 2022-03-31 NOTE — Progress Notes (Signed)
Patient is a pleasant 77 year old male with PMH of skin lesion on forehead s/p excision performed 03/26/2022 in office by Dr. Lovena Le who returns to clinic for postoperative follow-up.  Reviewed procedure note and the wound was closed with interrupted 5-0 Prolene sutures.  Specimen was sent to pathology, still is active and has not yet resulted.  Today, patient is doing well.  He is accompanied by his wife at bedside.  He tells me that he has not had any pain and feels prepared for suture removal.  On exam, skin edges appear well-approximated.  A total of 4 simple interrupted Prolene sutures are removed at bedside without complication or difficulty.  No wounds noted.  No ecchymoses or other overlying skin changes.  Confirmed with patient that he has MyChart and asked that he call the office to discuss the results of his pathology once he is notified they are available.  In the interim, recommending thin film of Vaseline once daily x 7 days afterwards, he could potentially transition to silicone scar gel, but he denies any history of keloiding/hypertrophic scarring and suspect that the scar will be minimal.  However, advised that he avoid UV exposure whenever possible.  No specific follow-up needed.  He will call clinic should he have questions or concerns.

## 2022-04-01 ENCOUNTER — Institutional Professional Consult (permissible substitution): Payer: Medicare PPO | Admitting: Plastic Surgery

## 2022-04-10 ENCOUNTER — Other Ambulatory Visit: Payer: Self-pay | Admitting: Hematology

## 2022-04-11 ENCOUNTER — Telehealth: Payer: Self-pay | Admitting: Plastic Surgery

## 2022-04-11 NOTE — Telephone Encounter (Signed)
Thank you, Carla. I just called and spoke with patient's wife.  Informed her that I would discuss with Dr. Ladona Ridgel and then call her back, likely early next week, about whether or not reexcision will be required.

## 2022-04-11 NOTE — Telephone Encounter (Signed)
Spouse called for biopsy results from 03/20/024. Please call home phone, if that doesn't work call the mobile.

## 2022-04-14 NOTE — Telephone Encounter (Signed)
Douglas Rose will require re-excision. Please get him scheduled for next available.

## 2022-04-16 ENCOUNTER — Encounter: Payer: Self-pay | Admitting: Plastic Surgery

## 2022-04-16 ENCOUNTER — Ambulatory Visit: Payer: Medicare PPO | Admitting: Plastic Surgery

## 2022-04-16 VITALS — BP 145/88 | HR 70

## 2022-04-16 DIAGNOSIS — D489 Neoplasm of uncertain behavior, unspecified: Secondary | ICD-10-CM

## 2022-04-16 DIAGNOSIS — D485 Neoplasm of uncertain behavior of skin: Secondary | ICD-10-CM

## 2022-04-16 NOTE — Progress Notes (Signed)
Procedure Note  Preoperative Dx: Positive margins on previous skin excision  Postoperative Dx: Same  Procedure: Reexcision for positive margins  Anesthesia: Lidocaine 1% with 1:100,000 epinephrine and 0.25% Sensorcaine   Indication for Procedure: Patient previously underwent an excision of a skin lesion on his forehead.  The pathology returned with atypia and recommendation for reexcision for positive margins.  Description of Procedure: Risks and complications were explained to the patient including the need for additional resection if he still has positive margins.  Consent was confirmed and the patient understands the risks and benefits.  The potential complications and alternatives were explained and the patient consents.  The patient expressed understanding the option of not having the procedure and the risks of a scar.  Time out was called and all information was confirmed to be correct.    The area was prepped and drapped.  Local anesthetic was injected in the subcutaneous tissues.  After waiting for the local to take affect the scar was excised.  After obtaining hemostasis, the surgical wound was closed interrupted 5-0 Prolene sutures.  The surgical wound measured 1 cm.  A dressing was applied.  The patient was given instructions on how to care for the area and a follow up appointment.  Douglas Rose tolerated the procedure well and there were no complications. The specimen was sent to pathology.   Patient understands he will need to have further excision which I will plan to do in the operating room if these margins are still positive.

## 2022-04-21 NOTE — Progress Notes (Unsigned)
Patient is a 77 year old male with history of a skin lesion to the forehead.  Patient initially underwent excision of the skin lesion to the forehead with Dr. Ladona Ridgel on 03/26/2022.  Specimen was sent to pathology and was found to be a sebaceous neoplasm, with margins involved.  Patient then underwent reexcision of the skin lesion to the forehead on 04/16/2022 with Dr. Ladona Ridgel.  During the procedure, the lesion was excised and the wound was closed with interrupted 5-0 Prolene sutures.  The specimen was sent to pathology, but is still pending.  Patient presents to the clinic today for postprocedural follow-up.  Today,

## 2022-04-23 ENCOUNTER — Encounter: Payer: Self-pay | Admitting: Student

## 2022-04-23 ENCOUNTER — Ambulatory Visit (INDEPENDENT_AMBULATORY_CARE_PROVIDER_SITE_OTHER): Payer: Medicare PPO | Admitting: Student

## 2022-04-23 VITALS — BP 142/82 | HR 64 | Ht 68.5 in | Wt 181.0 lb

## 2022-04-23 DIAGNOSIS — L989 Disorder of the skin and subcutaneous tissue, unspecified: Secondary | ICD-10-CM

## 2022-04-25 ENCOUNTER — Other Ambulatory Visit: Payer: Self-pay | Admitting: Radiology

## 2022-04-25 ENCOUNTER — Telehealth: Payer: Self-pay | Admitting: *Deleted

## 2022-04-25 DIAGNOSIS — R590 Localized enlarged lymph nodes: Secondary | ICD-10-CM

## 2022-04-25 NOTE — Telephone Encounter (Addendum)
I spoke with Mrs. Douglas Rose. She reports no new bleeding from the area and she had covered it with gauze. I advised her to use a sweatband or stretchy headband to add gentle compression and to call the office Monday if it had not improved and we could see him then. Also, if she had any concerns over the weekend to contact the office to speak to the on-call provider. She voiced understanding of the plan. No other concerns or questions voiced by Douglas Rose during our call.

## 2022-04-25 NOTE — Telephone Encounter (Signed)
Could he perhaps wear a sweatband over the weekend over top of the excision site to help provide gentle compression? It sounds like he may have developed a small hematoma. Would be happy to see him on Monday if he would like, but it's nothing that would need to be urgently addressed sooner.

## 2022-04-25 NOTE — Telephone Encounter (Signed)
Reports pt had bleeding from procedure site on forehead earlier today. Px was 4/10. Saw Alan Ripper 4/17 and had stitches removed. Held pressure.. Now has small amount of swelling (size on pinky fingernail). No active bleeding. Does he need to be seen?

## 2022-04-28 ENCOUNTER — Ambulatory Visit (HOSPITAL_COMMUNITY): Payer: Medicare PPO

## 2022-04-28 ENCOUNTER — Ambulatory Visit: Payer: Medicare PPO | Admitting: Physician Assistant

## 2022-04-28 DIAGNOSIS — L989 Disorder of the skin and subcutaneous tissue, unspecified: Secondary | ICD-10-CM

## 2022-04-28 DIAGNOSIS — Z7901 Long term (current) use of anticoagulants: Secondary | ICD-10-CM

## 2022-04-28 NOTE — Progress Notes (Signed)
  This is a 77 year old male who is status post forehead excision performed on 03/26/2022 by Dr. Ladona Ridgel.  Over the weekend the patient notes that after having his stitches removed he had bleeding from the forehead.  He notes that the bleeding has stopped but the area has a large bulging to it.  He notes he has continued his Eliquis.  He denies any other complaints today.  On exam: The patient has a dome-shaped scab over the incision site with some very minimal bleeding around the edges, this was unroofed with light pressure showing an open wound with some active bleeding.  No surrounding redness, wound edges clean.  Unfortunately the patient does have some bleeding from his incision site that has opened.  He is on Eliquis.  With the patient's consent I anesthetized the area with 1% lidocaine with epinephrine which did help to slow some of the bleeding, I then used a electric cautery to cauterize the bleeding vessel.  I covered the wound with Vaseline followed by Xeroform and a pressure dressing.  I instructed the patient to leave this in place for 2 days, after 2 days he may remove the Curlex but leave the dressing in place for another week and follow-up in our office at that time.  If he has any further bleeding is instructed apply pressure for approximately 1 hour, if it is still bleeding after this time I have instructed him to call our office.  He is given strict return precautions.  Both the patient and his wife verbalized understanding and agreement to today's plan had no further questions or concerns.

## 2022-04-29 ENCOUNTER — Telehealth: Payer: Self-pay | Admitting: Student

## 2022-04-29 NOTE — Telephone Encounter (Signed)
I called the patient's wife who was present during patient's last visit and let her know that all of the margins were free per the pathology and that patient will not need any further interventions.  Patient's wife expressed understanding and stated that she will communicate this with her husband.

## 2022-05-05 ENCOUNTER — Ambulatory Visit (INDEPENDENT_AMBULATORY_CARE_PROVIDER_SITE_OTHER): Payer: Medicare PPO | Admitting: Physician Assistant

## 2022-05-05 ENCOUNTER — Encounter: Payer: Self-pay | Admitting: Physician Assistant

## 2022-05-05 VITALS — BP 166/94 | HR 67 | Ht 68.5 in | Wt 181.0 lb

## 2022-05-05 DIAGNOSIS — L989 Disorder of the skin and subcutaneous tissue, unspecified: Secondary | ICD-10-CM | POA: Diagnosis not present

## 2022-05-05 NOTE — Progress Notes (Signed)
This is a 77 year old gentleman seen in our office for follow-up evaluation status post forehead excision performed on 03/26/2022 by Dr. Ladona Ridgel.  The patient was last seen in the office on 04/28/2022.  At that time he had some bleeding at the excision site that was not well-controlled.  I performed cautery and pressure dressing.  Since the office visit he notes he has been doing well, he is remove the dressing he had no issues or concerns, no surrounding redness warmth or any further bleeding.  On exam the patient has approximately 1 cm wound to the forehead with no surrounding redness warmth or fluctuance, no bleeding.  Overall the patient is doing very well, he is healing as expected.  At this point the patient requires no further evaluation management from our perspective he may follow-up if he develops any concerning signs or symptoms.  Both the patient and his wife verbalized understanding and agreement to today's plan had no further questions or concerns.

## 2022-05-09 NOTE — Progress Notes (Signed)
Pt's wife instructed to have pt hold eliquis/ ASA for 2 days prior to having biopsy. Pt's wife acknowledged information and verbalized understanding.

## 2022-05-13 ENCOUNTER — Other Ambulatory Visit: Payer: Self-pay | Admitting: Hematology

## 2022-05-16 ENCOUNTER — Telehealth: Payer: Self-pay | Admitting: Plastic Surgery

## 2022-05-16 NOTE — Telephone Encounter (Signed)
Missed a call from someone from our office.  Just wanted staff to know that he is doing well and no issues.

## 2022-05-22 ENCOUNTER — Other Ambulatory Visit: Payer: Self-pay | Admitting: Radiology

## 2022-05-22 NOTE — Progress Notes (Signed)
Chief Complaint: Patient was seen in consultation today for image guided right cervical lymph node biopsy  Referring Physician(s): Johney Maine  Supervising Physician: Oley Balm  Patient Status: [facility] - Out-pt  History of Present Illness: Douglas Rose. is a 77 y.o. male past medical history difficult for anemia, skin cancer, chronic kidney disease, PVD, CAD with prior stenting, hypertension, BPH, diabetes, elevated PSA,  hyperlipidemia,  pulmonary embolism, vitamin D deficiency and multiple myeloma.  Recent imaging has revealed a 2.1 cm right lateral neck nodule/lymph node. He presents today for image guided biopsy of this right cervical lymph node to rule out slow-growing lymphoma.  Past Medical History:  Diagnosis Date   Abdominal fullness 02/26/2016   Last Assessment & Plan:  overall improving with no pain, no further nausea and normal BM today.  No signs of obstruction or infection today.  Due for 10 year colonoscopy, mild anemia in December, will refer to GI   Acute renal failure (HCC) 03/06/2016   Acute renal failure superimposed on chronic kidney disease (HCC) 03/01/2016   AKI (acute kidney injury) (HCC)    Arterial insufficiency of lower extremity (HCC) 09/01/2016   Overview:  Mild (2018)  Last Assessment & Plan:  Mild arterial insufficiency, he does not have classic symptoms of claudication. Advised daily foot checks and would consider further evaluation if new or worsening symptoms  Mild (2018)  Last Assessment & Plan:  asymptomatic.  We will follow-up as needed Formatting of this note might be different from the original. Mild (2018)  Last Assessment & Pla   Atherosclerotic heart disease of native coronary artery without angina pectoris 05/02/2020   Benign hypertension 07/21/2011   Last Assessment & Plan:  Formatting of this note might be different from the original.  We discussed blood pressure above goal today. Previously well controlled. I recommended he  continue current medications, focus on lifestyle modifications including low-sodium diet and will reassess in 3 months   Benign prostatic hyperplasia 05/02/2020   CAD (coronary artery disease) 2006   stent LAD 3.0x18 Cypher, occluded RCA   Cancer (HCC)    multiple myeloma   Cerumen impaction 10/21/2021   Last Assessment & Plan: Formatting of this note might be different from the original.  Right cerumen impaction cleared with irrigation.  Left with yellow appearing discharge,  patient reports he also recently been flushing it at home. Patient denies any pain.  Concern for immunocompromised, chronic otorrhea, will rx ofloxacin drops per formualry   Cervical lymphadenopathy 03/31/2016   Colitis 05/12/2016   Coronary artery disease 07/21/2011   stent LAD 3.0x18 Cypher, occluded RCA  Overview:  Stent 2006 at [facility], Low risk stress test feb 2017  Last Assessment & Plan:  Declined further stress testing in 2015 due to cost and absence of symptoms.  Continues to be asymptomatic.  He has upcoming follow-up with cardiology for review. Encouraged to keep appt  Stent 2006 at [facility], Low risk s   Diabetes mellitus without complication [facility])    DNR (do not resuscitate) discussion    Elevated PSA 01/24/2015   Last Assessment & Plan: Formatting of this note might be different from the original.  Reports he has a pending biopsy which will be rescheduled due to recent DVT.  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  Patient is up-to-date on Covid booster   Infected tooth 03/31/2016   Leukocytosis    Lipoma 07/19/2014   Overview:  Right scalp.  Seen by Vaughan Sine in past  Last Assessment & Plan:  Lipoma on right scalp getting larger, will refer to ENT, but more concerned about lymph node   Lymphadenopathy 11/21/2015   Last Assessment & Plan:  Right submandibular lymph node, non-tender, now larger. CBC 2 months ago showed mild anemia, normal peripheral smear.  Will recheck today along with ESR, sed rate.  Will refer to ENT for further evaluation.    Last Assessment & Plan:   Soft, right-sided postauricular mass, deferred intervention in 2018 at onset of myeloma diagnosis, gradually enlarging over time.  Had cons   Monoclonal gammopathy    Multiple myeloma (HCC) 03/20/2016   Last Assessment & Plan:  Given dx, rec social distancing, avoiding in person jury duty.  Letter provided Formatting of this note might be different from the original. Last Assessment & Plan:  Myeloma treatment is going well, he has upcoming oncology visit   Multiple myeloma in remission (HCC) 07/22/2016   Last Assessment & Plan:  Formatting of this note might be different from the original.  Patient remains in remission, followed by oncology.  Had labs drawn today   Night sweats 01/15/2021   Last Assessment & Plan:   Formatting of this note might be different from the original.    With no signs of infection or new meds.  UTD with oncology as of last week.  Will check labs and CXR as noted.  Advised to let me know if new symptoms or persists, will consider blood and urine cultures.   Palliative care by specialist    Pancreatitis 03/02/2016   Peripheral neuropathy 07/22/2016   Last Assessment & Plan:  Overall improved. Patient has discontinued gabapentin, did not feel much difference  Feet and fingers.  Multifactorial, DM, Chemo, MM  Last Assessment & Plan:   Doing  well on gabapentin Formatting of this note might be different from the original. Last Assessment & Plan:  Overall improved. Patient has discontinued gabapentin, did not feel much difference   Physical deconditioning 06/13/2016   Last Assessment & Plan:  Marked weakness.  Will request further home assistance per home health with bathing.  Discharged on a supplement- patient reports is unaffordable.  Advised prefer food as no barriers, appetite reasonable, ok to use glucerna or carnation instant breakfast if desired.   Pre-operative clearance 01/30/2017   Protein-calorie malnutrition, severe 03/03/2016   Pulmonary embolism (HCC) 09/01/2021   Right hip pain 09/27/2019   Last Assessment & Plan:  Formatting of this note might be different from the original.  Recent tests reassuring for no malignancy.  known hip arthritis.  Symptoms today separate from neuropathy.  He plans on starting program with trainer at [facility], declined PT today for gait and strength.  Will reassess at followup.   Screening for malignant neoplasm of colon 05/02/2020   Sinus tachycardia 05/13/2016   SIRS (systemic inflammatory response syndrome) (HCC) 05/12/2016   Stage 3 chronic kidney disease (HCC)  06/13/2016   Last Assessment & Plan:  Will check Cr/K today  June 2020, annual follow-up [place] [facility]  Last Assessment & Plan:   Recent labs reviewed- stable Formatting of this note might be different from the original. Last Assessment & Plan:  Will check Cr/K today   Thrombocytopenia (HCC) 09/01/2021   Type 2 diabetes mellitus (HCC) 07/21/2011   Last Assessment & Plan:   Diabetes improved with fasting glucoses in a safe range.  Continue current regimen. Formatting of this note might be different from the original. Last Assessment & Plan:  Patient's diabetes has been well controlled at baseline on 12 units of Lantus daily.  He reports some hyperglycemia on the first few days after dexamethasone with resolution by the end of the week.  We'll   Unsteady gait 10/21/2021   Last Assessment & Plan: Formatting of this note might be different from the original.  Will treat cerumen impaction and otorrhea, continue PT.  Likely multifactorial with peripheral neuropathy, BPPV playing a role.  Conitnue PT   Upper abdominal pain    Urinary retention    Vitamin D deficiency 05/02/2020   Weakness generalized     Past Surgical History:  Procedure Laterality Date   CARDIAC CATHETERIZATION  2006   CORONARY ANGIOPLASTY WITH STENT PLACEMENT  2007   IR FLUORO GUIDE CV LINE RIGHT  05/20/2016   IR US GUIDE VASC ACCESS RIGHT  05/20/2016    Allergies: Patient has no known allergies.  Medications: Prior to Admission medications   Medication Sig Start Date End Date Taking? Authorizing Provider  amLODipine (NORVASC) 10 MG tablet Take 10 mg by mouth daily.    [provider]  aspirin EC 81 MG tablet Take 1 tablet (81 mg total) by mouth daily. Swallow whole. 11/07/21   Revankar, Aundra Dubin, MD  atorvastatin (LIPITOR) 20 MG tablet Take 1 tablet (20 mg total) by mouth daily. 12/06/12   Rosalio Macadamia, NP  Cholecalciferol 10 MCG (400 UNIT) CAPS Take 1 capsule by mouth daily.    [provider]  ELIQUIS 5 MG TABS tablet Take 1 tablet by mouth twice daily 05/13/22   Johney Maine, MD  gabapentin (NEURONTIN) 300 MG capsule Take 300 mg by mouth daily. 08/16/19   [provider]  LANTUS SOLOSTAR 100 UNIT/ML Solostar Pen Inject 16 Units into the skin daily. 11/30/20   [provider]  lisinopril (ZESTRIL) 2.5 MG tablet Take 2.5 mg by mouth daily. 09/30/19   [provider]  metoprolol succinate (TOPROL-XL) 100 MG 24 hr tablet Take 100 mg by mouth daily.  08/29/19   [provider]  nitroGLYCERIN (NITROSTAT) 0.4 MG SL tablet Place 1 tablet (0.4 mg total) under the tongue every 5 (five) minutes x 3 doses as needed for chest pain. 11/13/12   Edmisten, Brooke O, PA-C  vitamin B-12 (CYANOCOBALAMIN) 1000 MCG  tablet Take 1,000 mcg by mouth daily.    [provider]     Family History  Problem Relation Age of Onset   Arrhythmia Mother    Heart disease Mother    Diabetes Sister    Diabetes Brother    Cancer Brother    Stroke Father    Diabetes Brother     Social History   Socioeconomic History   Marital status: Married    Spouse name: Not on file   Number of children: 6   Years of education: Not on file   Highest education level: Not on file  Occupational  History   Occupation: retired Optician, dispensing  Tobacco Use   Smoking status: Never   Smokeless tobacco: Never  Vaping Use   Vaping Use: Never used  Substance and Sexual Activity   Alcohol use: No   Drug use: No   Sexual activity: Not Currently  Other Topics Concern   Not on file  Social History Narrative   Not on file   Social Determinants of Health   Financial Resource Strain: Not on file  Food Insecurity: Not on file  Transportation Needs: Not on file  Physical Activity: Not on file  Stress: Not on file  Social Connections: Not on file      Review of Systems denies fever, headache, chest pain, dyspnea, cough, abd pain, back pain, nausea, vomiting or bleeding  Vital Signs: Vitals:   05/23/22 1000  BP: (!) 160/80  Resp: 18  Temp: 98.9 F (37.2 C)  SpO2: 98%       Code Status: FULL CODE  Advance Care Plan: discussed with pt and wife is power of attorney but no formal documentation has been done    Physical Exam awake, alert.  Chest clear to auscultation bilaterally.  Heart with regular rate and rhythm.  Abdomen slightly protuberant, soft, positive bowel sounds, nontender.  No lower extremity edema.  Palpable right cervical lymph node, not significantly tender.  Imaging: No results found.  Labs:  CBC: Recent Labs    09/02/21 0826 10/08/21 1304 01/14/22 1234 03/18/22 1302  WBC 4.6 4.8 4.0 3.2*  HGB 9.9* 11.3* 12.3* 12.9*  HCT 29.9* 34.8* 37.4* 38.4*  PLT 111* 191 171 173     COAGS: Recent Labs    09/01/21 0152  INR 1.2  APTT 24    BMP: Recent Labs    09/02/21 0826 10/08/21 1304 01/14/22 1234 03/18/22 1302  NA 132* 136 137 137  K 3.3* 4.0 3.9 4.0  CL 101 105 103 106  CO2 24 28 30 25   GLUCOSE 175* 228* 205* 171*  BUN 16 23 16  26*  CALCIUM 7.9* 8.8* 9.1 8.9  CREATININE 1.61* 1.49* 1.64* 1.62*  GFRNONAA 44* 48* 43* 43*    LIVER FUNCTION TESTS: Recent Labs    09/01/21 0345 10/08/21 1304 01/14/22 1234 03/18/22 1302  BILITOT 1.0 0.4 0.4 0.4  AST 18 12* 19 13*  ALT 19 13 31 11   ALKPHOS 78 94 86 73  PROT 7.5 7.4 7.5 7.7  ALBUMIN 3.2* 3.7 3.6 3.8    TUMOR MARKERS: No results for input(s): "AFPTM", "CEA", "CA199", "CHROMGRNA" in the last 8760 hours.  Assessment and Plan: 77 y.o. male past medical history difficult for anemia, skin cancer, chronic kidney disease, PVD, CAD with prior stenting, hypertension, BPH, diabetes, elevated PSA,  hyperlipidemia,  pulmonary embolism, vitamin D deficiency and multiple myeloma.  Recent imaging has revealed a 2.1 cm right lateral neck nodule/lymph node. He presents today for image guided biopsy of this right cervical lymph node to rule out slow-growing lymphoma.Risks and benefits of procdure was discussed with the patient /spouse including, but not limited to bleeding, infection, damage to adjacent structures or low yield requiring additional tests.  All of the questions were answered and there is agreement to proceed.  Consent signed and in chart.    Thank you for this interesting consult.  I greatly enjoyed meeting Dalas Hartshorne. and look forward to participating in their care.  A copy of this report was sent to the requesting provider on this date.  Electronically Signed: D. Jeananne Rama,  PA-C 05/22/2022, 3:41 PM   I spent a total of  20 minutes   in face to face in clinical consultation, greater than 50% of which was counseling/coordinating care for image guided right cervical lymph node

## 2022-05-23 ENCOUNTER — Ambulatory Visit (HOSPITAL_COMMUNITY)
Admission: RE | Admit: 2022-05-23 | Discharge: 2022-05-23 | Disposition: A | Discharge status: Home / Self Care | Payer: Medicare PPO | Source: Ambulatory Visit | Attending: Hematology | Admitting: Hematology

## 2022-05-23 ENCOUNTER — Encounter (HOSPITAL_COMMUNITY): Payer: Self-pay

## 2022-05-23 DIAGNOSIS — E785 Hyperlipidemia, unspecified: Secondary | ICD-10-CM | POA: Insufficient documentation

## 2022-05-23 DIAGNOSIS — Z8572 Personal history of non-Hodgkin lymphomas: Secondary | ICD-10-CM | POA: Insufficient documentation

## 2022-05-23 DIAGNOSIS — I129 Hypertensive chronic kidney disease with stage 1 through stage 4 chronic kidney disease, or unspecified chronic kidney disease: Secondary | ICD-10-CM | POA: Diagnosis not present

## 2022-05-23 DIAGNOSIS — R590 Localized enlarged lymph nodes: Secondary | ICD-10-CM | POA: Diagnosis present

## 2022-05-23 DIAGNOSIS — Z86711 Personal history of pulmonary embolism: Secondary | ICD-10-CM | POA: Diagnosis not present

## 2022-05-23 DIAGNOSIS — E1122 Type 2 diabetes mellitus with diabetic chronic kidney disease: Secondary | ICD-10-CM | POA: Insufficient documentation

## 2022-05-23 DIAGNOSIS — C9000 Multiple myeloma not having achieved remission: Secondary | ICD-10-CM | POA: Insufficient documentation

## 2022-05-23 DIAGNOSIS — N189 Chronic kidney disease, unspecified: Secondary | ICD-10-CM | POA: Diagnosis not present

## 2022-05-23 LAB — CBC WITH DIFFERENTIAL/PLATELET
Abs Immature Granulocytes: 0.01 10*3/uL (ref 0.00–0.07)
Basophils Absolute: 0 10*3/uL (ref 0.0–0.1)
Basophils Relative: 0 %
Eosinophils Absolute: 0.1 10*3/uL (ref 0.0–0.5)
Eosinophils Relative: 3 %
HCT: 38.3 % — ABNORMAL LOW (ref 39.0–52.0)
Hemoglobin: 12.5 g/dL — ABNORMAL LOW (ref 13.0–17.0)
Immature Granulocytes: 0 %
Lymphocytes Relative: 33 %
Lymphs Abs: 1.3 10*3/uL (ref 0.7–4.0)
MCH: 28.9 pg (ref 26.0–34.0)
MCHC: 32.6 g/dL (ref 30.0–36.0)
MCV: 88.5 fL (ref 80.0–100.0)
Monocytes Absolute: 0.7 10*3/uL (ref 0.1–1.0)
Monocytes Relative: 17 %
Neutro Abs: 1.9 10*3/uL (ref 1.7–7.7)
Neutrophils Relative %: 47 %
Platelets: 178 10*3/uL (ref 150–400)
RBC: 4.33 MIL/uL (ref 4.22–5.81)
RDW: 15.9 % — ABNORMAL HIGH (ref 11.5–15.5)
WBC: 4 10*3/uL (ref 4.0–10.5)
nRBC: 0 % (ref 0.0–0.2)

## 2022-05-23 LAB — PROTIME-INR
INR: 1 (ref 0.8–1.2)
Prothrombin Time: 13.5 seconds (ref 11.4–15.2)

## 2022-05-23 LAB — BASIC METABOLIC PANEL
Anion gap: 6 (ref 5–15)
BUN: 21 mg/dL (ref 8–23)
CO2: 26 mmol/L (ref 22–32)
Calcium: 8.8 mg/dL — ABNORMAL LOW (ref 8.9–10.3)
Chloride: 106 mmol/L (ref 98–111)
Creatinine, Ser: 1.6 mg/dL — ABNORMAL HIGH (ref 0.61–1.24)
GFR, Estimated: 44 mL/min — ABNORMAL LOW (ref >60)
Glucose, Bld: 116 mg/dL — ABNORMAL HIGH (ref 70–99)
Potassium: 3.8 mmol/L (ref 3.5–5.1)
Sodium: 138 mmol/L (ref 135–145)

## 2022-05-23 LAB — GLUCOSE, CAPILLARY: Glucose-Capillary: 127 mg/dL — ABNORMAL HIGH (ref 70–99)

## 2022-05-23 MED ORDER — SODIUM CHLORIDE 0.9 % IV SOLN: INTRAVENOUS | Status: DC

## 2022-05-23 MED ORDER — LIDOCAINE HCL 1 % IJ SOLN
INTRAMUSCULAR | Status: AC
  Filled 2022-05-23: qty 20

## 2022-05-23 NOTE — Discharge Instructions (Addendum)
Please call Interventional Radiology clinic [phone] with any questions or concerns.   You may remove your dressing and shower tomorrow.    Needle Biopsy, Care After These instructions tell you how to care for yourself after your procedure. Your doctor may also give you more specific instructions. Call your doctor if youhave any problems or questions. What can I expect after the procedure? After the procedure, it is common to have: Soreness. Bruising. Mild pain. Follow these instructions at home:  Return to your normal activities as told by your doctor. Ask your doctor what activities are safe for you. Take over-the-counter and prescription medicines only as told by your doctor. Wash your hands with soap and water before you change your bandage (dressing). If you cannot use soap and water, use hand sanitizer. Follow instructions from your doctor about: How to take care of your puncture site. When and how to change your bandage. When to remove your bandage. Check your puncture site every day for signs of infection. Watch for: Redness, swelling, or pain. Fluid or blood. Pus or a bad smell. Warmth. Do not take baths, swim, or use a hot tub until your doctor approves. Ask your doctor if you may take showers. You may only be allowed to take sponge baths. Keep all follow-up visits as told by your doctor. This is important. Contact a doctor if you have: A fever. Redness, swelling, or pain at the puncture site, and it lasts longer than a few days. Fluid, blood, or pus coming from the puncture site. Warmth coming from the puncture site. Get help right away if: You have a lot of bleeding from the puncture site. Summary After the procedure, it is common to have soreness, bruising, or mild pain at the puncture site. Check your puncture site every day for signs of infection, such as redness, swelling, or pain. Get help right away if you have severe bleeding from your puncture  site. This information is not intended to replace advice given to you by your health care provider. Make sure you discuss any questions you have with your healthcare provider. Document Revised: 06/23/2019 Document Reviewed: 06/23/2019 Elsevier Patient Education  2022 [employer]    

## 2022-05-27 LAB — SURGICAL PATHOLOGY

## 2022-05-30 ENCOUNTER — Other Ambulatory Visit: Payer: Self-pay

## 2022-05-30 DIAGNOSIS — C9001 Multiple myeloma in remission: Secondary | ICD-10-CM

## 2022-06-03 ENCOUNTER — Inpatient Hospital Stay: Payer: Medicare PPO | Attending: Hematology

## 2022-06-03 DIAGNOSIS — Z7901 Long term (current) use of anticoagulants: Secondary | ICD-10-CM | POA: Insufficient documentation

## 2022-06-03 DIAGNOSIS — Z79899 Other long term (current) drug therapy: Secondary | ICD-10-CM | POA: Diagnosis not present

## 2022-06-03 DIAGNOSIS — Z86711 Personal history of pulmonary embolism: Secondary | ICD-10-CM | POA: Insufficient documentation

## 2022-06-03 DIAGNOSIS — Z7982 Long term (current) use of aspirin: Secondary | ICD-10-CM | POA: Diagnosis not present

## 2022-06-03 DIAGNOSIS — D649 Anemia, unspecified: Secondary | ICD-10-CM | POA: Insufficient documentation

## 2022-06-03 DIAGNOSIS — N289 Disorder of kidney and ureter, unspecified: Secondary | ICD-10-CM | POA: Insufficient documentation

## 2022-06-03 DIAGNOSIS — E119 Type 2 diabetes mellitus without complications: Secondary | ICD-10-CM | POA: Diagnosis not present

## 2022-06-03 DIAGNOSIS — C9001 Multiple myeloma in remission: Secondary | ICD-10-CM | POA: Insufficient documentation

## 2022-06-03 LAB — CBC WITH DIFFERENTIAL (CANCER CENTER ONLY)
Abs Immature Granulocytes: 0.01 10*3/uL (ref 0.00–0.07)
Basophils Absolute: 0 10*3/uL (ref 0.0–0.1)
Basophils Relative: 0 %
Eosinophils Absolute: 0.1 10*3/uL (ref 0.0–0.5)
Eosinophils Relative: 3 %
HCT: 36.2 % — ABNORMAL LOW (ref 39.0–52.0)
Hemoglobin: 11.9 g/dL — ABNORMAL LOW (ref 13.0–17.0)
Immature Granulocytes: 0 %
Lymphocytes Relative: 31 %
Lymphs Abs: 1.2 10*3/uL (ref 0.7–4.0)
MCH: 29 pg (ref 26.0–34.0)
MCHC: 32.9 g/dL (ref 30.0–36.0)
MCV: 88.3 fL (ref 80.0–100.0)
Monocytes Absolute: 0.5 10*3/uL (ref 0.1–1.0)
Monocytes Relative: 13 %
Neutro Abs: 2 10*3/uL (ref 1.7–7.7)
Neutrophils Relative %: 53 %
Platelet Count: 184 10*3/uL (ref 150–400)
RBC: 4.1 MIL/uL — ABNORMAL LOW (ref 4.22–5.81)
RDW: 16.1 % — ABNORMAL HIGH (ref 11.5–15.5)
WBC Count: 3.9 10*3/uL — ABNORMAL LOW (ref 4.0–10.5)
nRBC: 0 % (ref 0.0–0.2)

## 2022-06-03 LAB — CMP (CANCER CENTER ONLY)
ALT: 20 U/L (ref 0–44)
AST: 16 U/L (ref 15–41)
Albumin: 3.7 g/dL (ref 3.5–5.0)
Alkaline Phosphatase: 80 U/L (ref 38–126)
Anion gap: 6 (ref 5–15)
BUN: 23 mg/dL (ref 8–23)
CO2: 27 mmol/L (ref 22–32)
Calcium: 8.6 mg/dL — ABNORMAL LOW (ref 8.9–10.3)
Chloride: 105 mmol/L (ref 98–111)
Creatinine: 1.69 mg/dL — ABNORMAL HIGH (ref 0.61–1.24)
GFR, Estimated: 41 mL/min — ABNORMAL LOW (ref >60)
Glucose, Bld: 207 mg/dL — ABNORMAL HIGH (ref 70–99)
Potassium: 3.9 mmol/L (ref 3.5–5.1)
Sodium: 138 mmol/L (ref 135–145)
Total Bilirubin: 0.4 mg/dL (ref 0.3–1.2)
Total Protein: 7.1 g/dL (ref 6.5–8.1)

## 2022-06-04 LAB — KAPPA/LAMBDA LIGHT CHAINS
Kappa free light chain: 36.3 mg/L — ABNORMAL HIGH (ref 3.3–19.4)
Kappa, lambda light chain ratio: 1.46 (ref 0.26–1.65)
Lambda free light chains: 24.9 mg/L (ref 5.7–26.3)

## 2022-06-11 LAB — MULTIPLE MYELOMA PANEL, SERUM
Albumin SerPl Elph-Mcnc: 3.3 g/dL (ref 2.9–4.4)
Albumin/Glob SerPl: 1 (ref 0.7–1.7)
Alpha 1: 0.2 g/dL (ref 0.0–0.4)
Alpha2 Glob SerPl Elph-Mcnc: 0.7 g/dL (ref 0.4–1.0)
B-Globulin SerPl Elph-Mcnc: 0.9 g/dL (ref 0.7–1.3)
Gamma Glob SerPl Elph-Mcnc: 1.6 g/dL (ref 0.4–1.8)
Globulin, Total: 3.4 g/dL (ref 2.2–3.9)
IgA: 338 mg/dL (ref 61–437)
IgG (Immunoglobin G), Serum: 1693 mg/dL — ABNORMAL HIGH (ref 603–1613)
IgM (Immunoglobulin M), Srm: 42 mg/dL (ref 15–143)
Total Protein ELP: 6.7 g/dL (ref 6.0–8.5)

## 2022-06-17 ENCOUNTER — Other Ambulatory Visit: Payer: Self-pay | Admitting: Hematology

## 2022-06-17 ENCOUNTER — Inpatient Hospital Stay: Payer: Medicare PPO | Attending: Hematology | Admitting: Hematology

## 2022-06-17 ENCOUNTER — Other Ambulatory Visit: Payer: Self-pay

## 2022-06-17 VITALS — BP 142/72 | HR 65 | Temp 98.1°F | Ht 68.5 in | Wt 182.9 lb

## 2022-06-17 DIAGNOSIS — C9001 Multiple myeloma in remission: Secondary | ICD-10-CM | POA: Diagnosis present

## 2022-06-17 DIAGNOSIS — Z7982 Long term (current) use of aspirin: Secondary | ICD-10-CM | POA: Insufficient documentation

## 2022-06-17 DIAGNOSIS — Z79899 Other long term (current) drug therapy: Secondary | ICD-10-CM | POA: Insufficient documentation

## 2022-06-17 MED ORDER — APIXABAN 5 MG PO TABS: 5.0000 mg | ORAL_TABLET | Freq: Two times a day (BID) | ORAL | 3 refills | Status: DC

## 2022-06-17 NOTE — Progress Notes (Signed)
[name]  HEMATOLOGY ONCOLOGY CLINIC NOTE  Date of service: 06/17/22   Patient Care Team: Macy Mis, MD as PCP - General (Family Medicine) Johney Maine, MD as Consulting Physician (Hematology)  Chief Complaint : F/u for continued evaluation management of multiple myeloma  Diagnosis:  IgG Kappa multiple myeloma   Current Treatment: Surveillance   INTERVAL HISTORY:  Douglas Rose. is here for continued evaluation and management of his multiple myeloma.  Patient was last seen by me on 03/25/2022 and he complained of persistent bilateral leg neuropathy, but otherwise was doing well overall.   Patient is accompanied by his wife during this visit. He notes he has been doing well overall without any new or severe medical concerns. Patient does complains of persistent bilateral leg neuropathy. He currently takes Gabapentin 300 mg. He notes that the gabapentin sometimes relieves his neuropathy pain.   He denies any new infection issues, fever, chills, night sweats, abnormal bleeding issues, muscles cramps, unexpected weight loss, abdominal pain, chest pain, back pain, or leg swelling.   Patient regularly takes Eliquis as prescribed and baby aspirin.   REVIEW OF SYSTEMS:   10 Point review of Systems was done is negative except as noted above.   Past Medical History:  Diagnosis Date   Abdominal fullness 02/26/2016   Last Assessment & Plan:  overall improving with no pain, no further nausea and normal BM today.  No signs of obstruction or infection today.  Due for 10 year colonoscopy, mild anemia in December, will refer to GI   Acute renal failure (HCC) 03/06/2016   Acute renal failure superimposed on chronic kidney disease (HCC) 03/01/2016   AKI (acute kidney injury) (HCC)    Arterial insufficiency of lower extremity (HCC) 09/01/2016   Overview:  Mild (2018)  Last Assessment & Plan:  Mild arterial insufficiency, he does not have classic symptoms of claudication. Advised  daily foot checks and would consider further evaluation if new or worsening symptoms  Mild (2018)  Last Assessment & Plan:  asymptomatic.  We will follow-up as needed Formatting of this note might be different from the original. Mild (2018)  Last Assessment & Pla   Atherosclerotic heart disease of native coronary artery without angina pectoris 05/02/2020   Benign hypertension 07/21/2011   Last Assessment & Plan:  Formatting of this note might be different from the original.  We discussed blood pressure above goal today. Previously well controlled. I recommended he continue current medications, focus on lifestyle modifications including low-sodium diet and will reassess in 3 months   Benign prostatic hyperplasia 05/02/2020   CAD (coronary artery disease) 2006   stent LAD 3.0x18 Cypher, occluded RCA   Cancer (HCC)    multiple myeloma   Cerumen impaction 10/21/2021   Last Assessment & Plan: Formatting of this note might be different from the original.  Right cerumen impaction cleared with irrigation.  Left with yellow appearing discharge,  patient reports he also recently been flushing it at home. Patient denies any pain.  Concern for immunocompromised, chronic otorrhea, will rx ofloxacin drops per formualry   Cervical lymphadenopathy 03/31/2016   Colitis 05/12/2016   Coronary artery disease 07/21/2011   stent LAD 3.0x18 Cypher, occluded RCA  Overview:  Stent 2006 at [facility], Low risk stress test feb 2017  Last Assessment & Plan:  Declined further stress testing in 2015 due to cost and absence of symptoms.  Continues to be asymptomatic.  He has upcoming follow-up with cardiology for review. Encouraged  to keep appt  Stent 2006 at [facility], Low risk s   Diabetes mellitus without complication [facility])    DNR (do not resuscitate) discussion    Elevated PSA 01/24/2015   Last Assessment & Plan: Formatting of this note might be different from the original.   Reports he has a pending biopsy which will be rescheduled due to recent DVT.  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  Patient is up-to-date on Covid booster   Infected tooth 03/31/2016   Leukocytosis    Lipoma 07/19/2014   Overview:  Right scalp.  Seen by Vaughan Sine in past  Last Assessment & Plan:  Lipoma on right scalp getting larger, will refer to ENT, but more concerned about lymph node   Lymphadenopathy 11/21/2015   Last Assessment & Plan:  Right submandibular lymph node, non-tender, now larger. CBC 2 months ago showed mild anemia, normal peripheral smear.  Will recheck today along with ESR, sed rate.  Will refer to ENT for further evaluation.    Last Assessment & Plan:   Soft, right-sided postauricular mass, deferred intervention in 2018 at onset of myeloma diagnosis, gradually enlarging over time.  Had cons   Monoclonal gammopathy    Multiple myeloma (HCC) 03/20/2016   Last Assessment & Plan:  Given dx, rec social distancing, avoiding in person jury duty.  Letter provided Formatting of this note might be different from the original. Last Assessment & Plan:  Myeloma treatment is going well, he has upcoming oncology visit   Multiple myeloma in remission (HCC) 07/22/2016   Last Assessment & Plan:  Formatting of this note might be different from the original.  Patient remains in remission, followed by oncology.  Had labs drawn today   Night sweats 01/15/2021   Last  Assessment & Plan:   Formatting of this note might be different from the original.    With no signs of infection or new meds.  UTD with oncology as of last week.  Will check labs and CXR as noted.  Advised to let me know if new symptoms or persists, will consider blood and urine cultures.   Palliative care by specialist    Pancreatitis 03/02/2016   Peripheral neuropathy 07/22/2016   Last Assessment & Plan:  Overall improved. Patient has discontinued gabapentin, did not feel much difference  Feet and fingers.  Multifactorial, DM, Chemo, MM  Last Assessment & Plan:   Doing well on gabapentin Formatting of this note might be different from the original. Last Assessment & Plan:  Overall improved. Patient has discontinued gabapentin, did not feel much difference   Physical deconditioning 06/13/2016   Last Assessment & Plan:  Marked weakness.  Will request further home assistance per home health with bathing.  Discharged on a supplement- patient reports is unaffordable.  Advised prefer food as no barriers, appetite reasonable, ok to use glucerna or carnation instant breakfast if desired.   Pre-operative clearance 01/30/2017   Protein-calorie  malnutrition, severe 03/03/2016   Pulmonary embolism (HCC) 09/01/2021   Right hip pain 09/27/2019   Last Assessment & Plan:  Formatting of this note might be different from the original.  Recent tests reassuring for no malignancy.  known hip arthritis.  Symptoms today separate from neuropathy.  He plans on starting program with trainer at [facility], declined PT today for gait and strength.  Will reassess at followup.   Screening for malignant neoplasm of colon 05/02/2020   Sinus tachycardia 05/13/2016   SIRS (systemic inflammatory response syndrome) (HCC) 05/12/2016   Stage 3 chronic kidney disease (HCC) 06/13/2016   Last Assessment & Plan:  Will check Cr/K today  June 2020, annual follow-up [place] kidney  Last Assessment & Plan:   Recent labs reviewed- stable Formatting  of this note might be different from the original. Last Assessment & Plan:  Will check Cr/K today   Thrombocytopenia (HCC) 09/01/2021   Type 2 diabetes mellitus (HCC) 07/21/2011   Last Assessment & Plan:   Diabetes improved with fasting glucoses in a safe range.  Continue current regimen. Formatting of this note might be different from the original. Last Assessment & Plan:  Patient's diabetes has been well controlled at baseline on 12 units of Lantus daily.  He reports some hyperglycemia on the first few days after dexamethasone with resolution by the end of the week. We'll   Unsteady gait 10/21/2021   Last Assessment & Plan: Formatting of this note might be different from the original.  Will treat cerumen impaction and otorrhea, continue PT.  Likely multifactorial with peripheral neuropathy, BPPV playing a role.  Conitnue PT   Upper abdominal pain    Urinary retention    Vitamin D deficiency 05/02/2020   Weakness generalized     . Past Surgical History:  Procedure Laterality Date   CARDIAC CATHETERIZATION  2006   CORONARY ANGIOPLASTY WITH STENT PLACEMENT  2007   IR FLUORO GUIDE CV LINE RIGHT  05/20/2016   IR US GUIDE VASC ACCESS RIGHT  05/20/2016    . Social History   Tobacco Use   Smoking status: Never   Smokeless tobacco: Never  Vaping Use   Vaping Use: Never used  Substance Use Topics   Alcohol use: No   Drug use: No    ALLERGIES:  has No Known Allergies.  MEDICATIONS:  Current Outpatient Medications  Medication Sig Dispense Refill   amLODipine (NORVASC) 10 MG tablet Take 10 mg by mouth daily.     aspirin EC 81 MG tablet Take 1 tablet (81 mg total) by mouth daily. Swallow whole. 90 tablet 3   atorvastatin (LIPITOR) 20 MG tablet Take 1 tablet (20 mg total) by mouth daily. 90 tablet 3   Cholecalciferol 10 MCG (400 UNIT) CAPS Take 1 capsule by mouth daily.     ELIQUIS 5 MG TABS tablet Take 1 tablet by mouth twice daily 60 tablet 0   gabapentin (NEURONTIN) 300 MG capsule  Take 300 mg by mouth daily.     LANTUS SOLOSTAR 100 UNIT/ML Solostar Pen Inject 16 Units into the skin daily.     lisinopril (ZESTRIL) 2.5 MG tablet Take 2.5 mg by mouth daily.     metoprolol succinate (TOPROL-XL) 100 MG 24 hr tablet Take 100 mg by mouth daily.      nitroGLYCERIN (NITROSTAT) 0.4 MG SL tablet Place 1 tablet (0.4 mg total) under the tongue every 5 (five) minutes x 3 doses as needed for chest pain. 30 tablet 12   vitamin  B-12 (CYANOCOBALAMIN) 1000 MCG tablet Take 1,000 mcg by mouth daily.     No current facility-administered medications for this visit.    PHYSICAL EXAMINATION: .BP (!) 142/72 (BP Location: Left Arm, Patient Position: Sitting)   Pulse 65   Temp 98.1 F (36.7 C) (Oral)   Ht 5' 8.5" (1.74 m)   Wt 182 lb 14.4 oz (83 kg)   SpO2 100%   BMI 27.41 kg/m   GENERAL:alert, in no acute distress and comfortable SKIN: no acute rashes, no significant lesions EYES: conjunctiva are pink and non-injected, sclera anicteric OROPHARYNX: MMM, no exudates, no oropharyngeal erythema or ulceration NECK: supple, no JVD LYMPH:  no palpable lymphadenopathy in the cervical, axillary or inguinal regions LUNGS: clear to auscultation b/l with normal respiratory effort HEART: regular rate & rhythm ABDOMEN:  normoactive bowel sounds , non tender, not distended. Extremity: no pedal edema PSYCH: alert & oriented x 3 with fluent speech NEURO: no focal motor/sensory deficits   LABORATORY DATA:   I have reviewed the data as listed     Latest Ref Rng & Units 06/03/2022   12:14 PM 05/23/2022   11:10 AM 03/18/2022    1:02 PM  CBC  WBC 4.0 - 10.5 K/uL 3.9  4.0  3.2   Hemoglobin 13.0 - 17.0 g/dL 16.1  09.6  04.5   Hematocrit 39.0 - 52.0 % 36.2  38.3  38.4   Platelets 150 - 400 K/uL 184  178  173        Latest Ref Rng & Units 06/03/2022   12:14 PM 05/23/2022   11:10 AM 03/18/2022    1:02 PM  CMP  Glucose 70 - 99 mg/dL 409  811  914   BUN 8 - 23 mg/dL 23  21  26    Creatinine 0.61  - 1.24 mg/dL 7.82  9.56  2.13   Sodium 135 - 145 mmol/L 138  138  137   Potassium 3.5 - 5.1 mmol/L 3.9  3.8  4.0   Chloride 98 - 111 mmol/L 105  106  106   CO2 22 - 32 mmol/L 27  26  25    Calcium 8.9 - 10.3 mg/dL 8.6  8.8  8.9   Total Protein 6.5 - 8.1 g/dL 7.1   7.7   Total Bilirubin 0.3 - 1.2 mg/dL 0.4   0.4   Alkaline Phos 38 - 126 U/L 80   73   AST 15 - 41 U/L 16   13   ALT 0 - 44 U/L 20   11        RADIOGRAPHIC STUDIES: I have personally reviewed the radiological images as listed and agreed with the findings in the report. [name] CORE BIOPSY (LYMPH NODES)  Result Date: 05/23/2022 INDICATION: Right neck subcutaneous nodule versus adenopathy. History of multiple myeloma. EXAM: ULTRASOUND GUIDED CORE BIOPSY OF  RIGHT CERVICAL MASS MEDICATIONS: lidocaine 1% subcutaneous ANESTHESIA/SEDATION: None required PROCEDURE: The procedure, risks, benefits, and alternatives were explained to the patient. Questions regarding the procedure were encouraged and answered. The patient understands and consents to the procedure. Survey ultrasound of the right neck was performed. The high right cervical nodule was localized and an appropriate skin entry site was determined and marked. The operative field was prepped with chlorhexidine in a sterile fashion, and a sterile drape was applied covering the operative field. A sterile gown and sterile gloves were used for the procedure. Local anesthesia was provided with 1% Lidocaine. Under real-time ultrasound guidance, a short 17 gauge trocar  guide needle was advanced to the margin of the lesion. Once needle tip position was confirmed, coaxial 18-gauge core biopsy samples were obtained, submitted in saline to surgical pathology. The guide needle was removed. Postprocedure scans show no hemorrhage or other apparent complication. The patient tolerated the procedure well. COMPLICATIONS: None immediate. FINDINGS: 2.2 x 1.7 x 1.1 cm right cervical nodule with central  calcifications was localized corresponding to palpable region. Representative core biopsy samples obtained as above. The patient tolerated the procedure well. IMPRESSION: Technically successful ultrasound-guided core biopsy, right cervical nodule. Electronically Signed   By: Corlis Leak M.D.   On: 05/23/2022 15:26    ASSESSMENT & PLAN:   77 y.o.  with multiple medical comorbidities but fairly good performance status overall with   1) ISS Stage II IgG Kapppa multiple myeloma. Anemia + Renal insufficiency.  Bone survey neg for concerning bone lesions. significantly elevated kappa free light chains 858, lambda 18.4, with a ratio of 46.65 . He was also noted to have an M spike of 1.7 g/dL with IFE showing IgG kappa monoclonal protein. Quantitative IgG level was increased to nearly 2500 mg/dL. 24-hour UPEP showed monoclonal protein of 1300 mg per 24 hours which constituted about 86% of all the urinary protein. Bone marrow biopsy showed 20-30% Restricted plasma cells consistent with multiple myeloma Cytogenetics/FISH  Showed +4, +11, and +17   Patient has been treated with CyBord X 2 cycles . Treatment interrupted due to severe c diff colitis with prolonged hospitalization and decline in performance status.  Patient is much improved and back to baseline now. M protein undetected with neg IFE Has been treated with 5 cycles of Rd  Had been on maintenance Revlimid for more than 2 years until PEs in August 2023.  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 Had an element of obstructive uropathy due to BPH which has resolved. Creatinine continues to be stable.   3) Normocytic anemia likely related to multiple myeloma.-stable. No abdominal pain or discomfort over the right upper quadrant. No fevers or chills.  #4 right lower and middle lobe pulmonary embolism August 2023-Revlimid possible provoking factor. currently off Revlimid. 4) DM2 -  controlled -continue management per PC   5) Concern for skin cancer lesion on forehead  PLAN:  -Discussed lab results from 06/03/2022 with the patient. CBC shows slightly decreased hemoglobin at 11.9 g/dL and slightly decreased hematocrit at 36.2%. CMP shows elevated glucose levels at 207 and elevated creatinine at 1.69.  -Discussed multiple myeloma panel from 06/03/2022 which did not show M-protein. Kappa/lambda light chain ratio in the normal range at 1.46.  -Discussed surgical pathology results from 05/23/2022 with the patient and his wife. Pathology results showed negative for malignancy.   -Answered all of patient's questions.  -Patient has no obvious lab or clinical evidence of myeloma progression at this time.   -discussed with the patient to discontinue Eliquis after he finishes his current doses.  -Continue baby aspirin.   FOLLOW-UP: Labs in 12 weeks RTC with Dr Candise Che in 14 weeks   The total time spent in the appointment was 20 minutes* .  All of the patient's questions were answered with apparent satisfaction. The patient knows to call the clinic with any problems, questions or concerns.   Wyvonnia Lora MD MS AAHIVMS [facility] Hematology/Oncology Physician [facility]  .*Total Encounter Time as defined by the Centers for Medicare and Medicaid Services includes, in addition to the face-to-face time of a  patient visit (documented in the note above) non-face-to-face time: obtaining and reviewing outside history, ordering and reviewing medications, tests or procedures, care coordination (communications with other health care professionals or caregivers) and documentation in the medical record.   I, Ok Edwards, am acting as a Neurosurgeon for Wyvonnia Lora, MD.  .I have reviewed the above documentation for accuracy and completeness, and I agree with the above.  Johney Maine MD

## 2022-07-03 ENCOUNTER — Ambulatory Visit: Payer: Medicare PPO | Attending: Critical Care Medicine | Admitting: Critical Care Medicine

## 2022-07-03 ENCOUNTER — Other Ambulatory Visit: Payer: Self-pay

## 2022-07-03 ENCOUNTER — Encounter: Payer: Self-pay | Admitting: Critical Care Medicine

## 2022-07-03 ENCOUNTER — Telehealth: Payer: Self-pay | Admitting: Critical Care Medicine

## 2022-07-03 VITALS — BP 135/65 | HR 64 | Ht 68.5 in | Wt 182.8 lb

## 2022-07-03 DIAGNOSIS — I10 Essential (primary) hypertension: Secondary | ICD-10-CM

## 2022-07-03 DIAGNOSIS — I2510 Atherosclerotic heart disease of native coronary artery without angina pectoris: Secondary | ICD-10-CM

## 2022-07-03 DIAGNOSIS — E782 Mixed hyperlipidemia: Secondary | ICD-10-CM

## 2022-07-03 DIAGNOSIS — I739 Peripheral vascular disease, unspecified: Secondary | ICD-10-CM | POA: Diagnosis not present

## 2022-07-03 DIAGNOSIS — K047 Periapical abscess without sinus: Secondary | ICD-10-CM

## 2022-07-03 DIAGNOSIS — E0822 Diabetes mellitus due to underlying condition with diabetic chronic kidney disease: Secondary | ICD-10-CM

## 2022-07-03 DIAGNOSIS — C9001 Multiple myeloma in remission: Secondary | ICD-10-CM

## 2022-07-03 DIAGNOSIS — N1831 Chronic kidney disease, stage 3a: Secondary | ICD-10-CM

## 2022-07-03 DIAGNOSIS — N183 Chronic kidney disease, stage 3 unspecified: Secondary | ICD-10-CM

## 2022-07-03 DIAGNOSIS — R972 Elevated prostate specific antigen [PSA]: Secondary | ICD-10-CM

## 2022-07-03 DIAGNOSIS — Z86711 Personal history of pulmonary embolism: Secondary | ICD-10-CM

## 2022-07-03 DIAGNOSIS — Z125 Encounter for screening for malignant neoplasm of prostate: Secondary | ICD-10-CM

## 2022-07-03 DIAGNOSIS — I2609 Other pulmonary embolism with acute cor pulmonale: Secondary | ICD-10-CM

## 2022-07-03 DIAGNOSIS — E1122 Type 2 diabetes mellitus with diabetic chronic kidney disease: Secondary | ICD-10-CM

## 2022-07-03 DIAGNOSIS — L720 Epidermal cyst: Secondary | ICD-10-CM

## 2022-07-03 DIAGNOSIS — D472 Monoclonal gammopathy: Secondary | ICD-10-CM

## 2022-07-03 MED ORDER — GABAPENTIN 300 MG PO CAPS: 300.0000 mg | ORAL_CAPSULE | Freq: Every day | ORAL | 2 refills | Status: DC

## 2022-07-03 MED ORDER — LANTUS SOLOSTAR 100 UNIT/ML ~~LOC~~ SOPN: 20.0000 [IU] | PEN_INJECTOR | Freq: Every day | SUBCUTANEOUS | 2 refills | Status: DC

## 2022-07-03 MED ORDER — AMLODIPINE BESYLATE 10 MG PO TABS: 10.0000 mg | ORAL_TABLET | Freq: Every day | ORAL | 2 refills | Status: DC

## 2022-07-03 MED ORDER — DAPAGLIFLOZIN PROPANEDIOL 10 MG PO TABS: 10.0000 mg | ORAL_TABLET | Freq: Every day | ORAL | 3 refills | Status: DC

## 2022-07-03 MED ORDER — LISINOPRIL 2.5 MG PO TABS: 2.5000 mg | ORAL_TABLET | Freq: Every day | ORAL | 2 refills | Status: DC

## 2022-07-03 MED ORDER — METOPROLOL SUCCINATE ER 100 MG PO TB24: 100.0000 mg | ORAL_TABLET | Freq: Every day | ORAL | 2 refills | Status: DC

## 2022-07-03 MED ORDER — AMOXICILLIN 250 MG PO CAPS: ORAL_CAPSULE | ORAL | 0 refills | Status: AC

## 2022-07-03 MED ORDER — ATORVASTATIN CALCIUM 20 MG PO TABS: 20.0000 mg | ORAL_TABLET | Freq: Every day | ORAL | 3 refills | Status: DC

## 2022-07-03 NOTE — Assessment & Plan Note (Signed)
Care per oncology

## 2022-07-03 NOTE — Assessment & Plan Note (Signed)
Reassess PSA 

## 2022-07-03 NOTE — Assessment & Plan Note (Signed)
Monitor

## 2022-07-03 NOTE — Assessment & Plan Note (Signed)
History of pulmonary embolism due to chemotherapy coming off therapy of pulmonary embolism

## 2022-07-03 NOTE — Assessment & Plan Note (Signed)
History of vascular disease seen at [facility] health would like to stay with that clinic has follow-up continue with aspirin and statin

## 2022-07-03 NOTE — Patient Instructions (Addendum)
Start Douglas Rose one daily for diabetes Increase lantus to 20 units daily Take amoxicillin one dose 1-2 hours before any dental procedure No change in other medications, refills sent to [facility] Labs : prostate screen, cholesterol Referrals made to : cardiology , Dental, , keep EYE appt, referral to vascular Return Dr Delford Field 2 months

## 2022-07-03 NOTE — Assessment & Plan Note (Signed)
Cyst removed healing benign

## 2022-07-03 NOTE — Assessment & Plan Note (Signed)
Type 2 diabetes poorly controlled A1c greater than 8.3 he would benefit from a CGM monitor  Will add Farxiga 10 mg daily for renal protection cardiovascular disease and diabetes  Note GFR greater than 40  Increase Lantus to 20 units daily  Refills on all medications sent to his center will mail order pharmacy Check lipid panel

## 2022-07-03 NOTE — Assessment & Plan Note (Signed)
Reassess lipid continue atorvastatin

## 2022-07-03 NOTE — Assessment & Plan Note (Signed)
Continue with gabapentin 

## 2022-07-03 NOTE — Assessment & Plan Note (Signed)
Hypertension under good control no change in medication 

## 2022-07-03 NOTE — Assessment & Plan Note (Signed)
Coronary artery disease previous stent in LAD wishes to transfer care to our cardiologist referral made continue aspirin and atorvastatin metoprolol

## 2022-07-03 NOTE — Telephone Encounter (Signed)
The spouse of the patient called in stating they were unable to get his refill for his prescription for insulin that he thought his previous provider had called into the local [facility]. They got there and it had not been called in at all. He is now without his insulin. She stated Dr Delford Field had called a refill into the mail order pharmacy/ [facility] but that would be 3-5 days until he can receive this which means he would still have to go a few days without it and he needs this daily.  The patient uses  [facility] [address] [place], [place] - [address] Phone: [phone]  Fax: [phone]      And is hoping a small supply can be sent in to this pharmacy to last until the delivery from [facility].

## 2022-07-03 NOTE — Assessment & Plan Note (Signed)
Per oncology this is improved

## 2022-07-03 NOTE — Progress Notes (Signed)
New Patient Office Visit  Subjective    Patient ID: Douglas Ferron., male    DOB: 07-14-1945  Age: 77 y.o. MRN: [id number]  CC:  Chief Complaint  Patient presents with   New Patient (Initial Visit)    HPI Douglas Rose. 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 Earnest Bailey wishes to transfer to our clinic his wife is already at our clinic.  He also wishes to have his cardiology care transferred to our clinic he will continue his vascular surgery care at [facility].  Followed by oncology at [facility] for multiple myeloma.  He has peripheral neuropathy stage III chronic kidney disease likely due to combination of diabetes and myeloma.  Myeloma is under improved control and he did have a pulmonary embolism as a result of one of his chemotherapy agents.  He is about to finish his course of Eliquis and that chemotherapy agent has been stopped.  He has history of coronary artery disease  had a stent placed many years ago he would like transfer care here  Patient has had acute renal failure in the past is now stable  Patient had elevated PSA in the past this needs to be followed up  He has insulin-dependent type 2 diabetes currently on Lantus 18 units a day on no other medication for diabetes A1c has been elevated 8.3 recently.  He would like dental prophylaxis for dental procedures.  Blood pressure today is good 135/65 with amlodipine 10 mg daily metoprolol 100 mg daily  Patient maintains chronic aspirin  Patient is on Eliquis but due to stop in a few days.  Patient is on low-dose lisinopril  Patient looking for another dentist  Patient has an eye doctor will follow-up  Patient wants to cardiology referral in our city. 77 y.o.  with multiple medical comorbidities but fairly good performance status overall with    1) ISS Stage II IgG Kapppa multiple myeloma. Anemia + Renal insufficiency.  Bone survey neg for concerning bone lesions. significantly elevated kappa free light chains 858, lambda 18.4, with a ratio of 46.65 . He was also noted to have an M spike of 1.7 g/dL with IFE showing IgG kappa monoclonal protein. Quantitative IgG level was increased to nearly 2500 mg/dL. 24-hour UPEP showed monoclonal protein of 1300 mg per 24 hours which constituted about 86% of all the urinary protein. Bone marrow biopsy showed 20-30% Restricted plasma cells consistent with multiple myeloma Cytogenetics/FISH  Showed +4, +11, and +17    Patient  has been treated with CyBord X 2 cycles . Treatment interrupted due to severe c diff colitis with prolonged hospitalization and decline in performance status.  Patient is much improved and back to baseline now. M protein undetected with neg IFE Has been treated with 5 cycles of Rd  Had been on maintenance Revlimid for more than 2 years until PEs in August 2023.   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 Had an element of obstructive uropathy due to BPH which has resolved. Creatinine continues to be stable.    3) Normocytic anemia likely related to multiple myeloma.-stable. No abdominal pain or discomfort over the right upper quadrant. No fevers or chills.   #4 right lower and middle lobe pulmonary embolism August 2023-Revlimid possible provoking factor. currently off Revlimid. 4) DM2 - controlled -continue management per PC    5) Concern for skin cancer lesion on forehead   PLAN:  -Discussed lab results from 06/03/2022 with the patient. CBC shows slightly decreased hemoglobin at 11.9 g/dL and slightly decreased hematocrit at 36.2%. CMP shows elevated glucose levels at 207 and elevated creatinine at 1.69.  -Discussed multiple myeloma panel from 06/03/2022 which did not show M-protein. Kappa/lambda light chain ratio in the normal range at 1.46.  -Discussed surgical pathology results from 05/23/2022 with the patient and his wife. Pathology results showed negative for malignancy.   -Answered all of patient's questions.  -Patient has no obvious lab or clinical evidence of myeloma progression at this time.   -discussed with the patient to discontinue Eliquis after he finishes his current doses.  -Continue baby aspirin.    FOLLOW-UP: Labs in 12 weeks  Outpatient Encounter Medications as of 07/03/2022  Medication Sig   amoxicillin (AMOXIL) 250 MG capsule Once before dental procedures   aspirin EC 81 MG tablet Take 1 tablet (81 mg total) by mouth daily. Swallow whole.   Cholecalciferol 10 MCG (400 UNIT) CAPS Take 1 capsule by mouth daily.   dapagliflozin propanediol (FARXIGA) 10 MG TABS tablet Take 1 tablet (10 mg total) by mouth daily before breakfast.   ELIQUIS 5 MG TABS tablet Take 5 mg by mouth 2 (two) times daily.   nitroGLYCERIN (NITROSTAT) 0.4 MG SL tablet Place 1 tablet (0.4 mg total) under the tongue every 5 (five) minutes x 3 doses as needed for chest pain.   vitamin  B-12 (CYANOCOBALAMIN) 1000 MCG tablet Take 1,000 mcg by mouth daily.   [DISCONTINUED] amLODipine (NORVASC) 10 MG tablet Take 10 mg by mouth daily.   [DISCONTINUED] atorvastatin (LIPITOR) 20 MG tablet Take 1 tablet (20 mg total) by mouth daily.   [DISCONTINUED] gabapentin (NEURONTIN) 300 MG capsule Take 300 mg by mouth daily.   [DISCONTINUED] LANTUS SOLOSTAR 100 UNIT/ML Solostar Pen Inject 18 Units into the skin daily.   [DISCONTINUED] lisinopril (ZESTRIL) 2.5 MG tablet Take 2.5 mg by mouth daily.   [DISCONTINUED] metoprolol succinate (TOPROL-XL) 100 MG 24 hr tablet Take 100 mg by mouth daily.    amLODipine (NORVASC) 10 MG tablet Take 1 tablet (10 mg total) by mouth daily.   atorvastatin (LIPITOR) 20 MG tablet Take 1 tablet (20 mg total) by mouth daily.   gabapentin (NEURONTIN) 300 MG capsule Take 1 capsule (300 mg total) by mouth daily.   LANTUS SOLOSTAR 100 UNIT/ML Solostar Pen Inject 20 Units into the skin daily.   lisinopril (ZESTRIL) 2.5 MG tablet Take 1 tablet (2.5 mg total) by mouth daily.   metoprolol succinate (TOPROL-XL) 100 MG 24  hr tablet Take 1 tablet (100 mg total) by mouth daily.   No facility-administered encounter medications on file as of 07/03/2022.    Past Medical History:  Diagnosis Date   Abdominal fullness 02/26/2016   Last Assessment & Plan:  overall improving with no pain, no further nausea and normal BM today.  No signs of obstruction or infection today.  Due for 10 year colonoscopy, mild anemia in December, will refer to GI   Acute renal failure (HCC) 03/06/2016   Acute renal failure superimposed on chronic kidney disease (HCC) 03/01/2016   AKI (acute kidney injury) (HCC)    Arterial insufficiency of lower extremity (HCC) 09/01/2016   Overview:  Mild (2018)  Last Assessment & Plan:  Mild arterial insufficiency, he does not have classic symptoms of claudication. Advised daily foot checks and would consider further evaluation if new or worsening symptoms  Mild (2018)   Last Assessment & Plan:  asymptomatic.  We will follow-up as needed Formatting of this note might be different from the original. Mild (2018)  Last Assessment & Pla   Atherosclerotic heart disease of native coronary artery without angina pectoris 05/02/2020   Benign hypertension 07/21/2011   Last Assessment & Plan:  Formatting of this note might be different from the original.  We discussed blood pressure above goal today. Previously well controlled. I recommended he continue current medications, focus on lifestyle modifications including low-sodium diet and will reassess in 3 months   Benign prostatic hyperplasia 05/02/2020   CAD (coronary artery disease) 2006   stent LAD 3.0x18 Cypher, occluded RCA   Cancer (HCC)    multiple myeloma   Cerumen impaction 10/21/2021   Last Assessment & Plan: Formatting of this note might be different from the original.  Right cerumen impaction cleared with irrigation.  Left with yellow appearing discharge,  patient reports he also recently been flushing it at home. Patient denies any pain.  Concern for immunocompromised, chronic otorrhea, will rx ofloxacin drops per formualry   Cervical lymphadenopathy 03/31/2016   Colitis 05/12/2016   Coronary artery disease 07/21/2011   stent LAD 3.0x18 Cypher, occluded RCA  Overview:  Stent 2006 at [facility], Low risk stress test feb 2017  Last Assessment & Plan:  Declined further stress testing in 2015 due to cost and absence of symptoms.  Continues to be asymptomatic.  He has upcoming follow-up with cardiology for review. Encouraged to keep appt  Stent 2006 at [facility], Low risk s   Diabetes mellitus without complication [facility])    DNR (do not resuscitate) discussion    Elevated PSA 01/24/2015   Last Assessment & Plan: Formatting of this note might be different from the original.  Reports he has a pending biopsy which will be rescheduled due to recent DVT.  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  Patient is up-to-date on Covid booster   Infected tooth 03/31/2016   Leukocytosis  Lipoma 07/19/2014   Overview:  Right scalp.  Seen by Vaughan Sine in past  Last Assessment & Plan:  Lipoma on right scalp getting larger, will refer to ENT, but more concerned about lymph node   Lymphadenopathy 11/21/2015   Last Assessment & Plan:  Right submandibular lymph node, non-tender, now larger. CBC 2 months ago showed mild anemia, normal peripheral smear.  Will recheck today along with ESR, sed rate.  Will refer to ENT for further evaluation.    Last Assessment & Plan:   Soft, right-sided postauricular mass, deferred intervention in 2018 at onset of myeloma diagnosis, gradually enlarging over time.  Had cons   Monoclonal gammopathy    Multiple myeloma (HCC) 03/20/2016   Last Assessment & Plan:  Given dx, rec social distancing, avoiding in person jury duty.  Letter provided Formatting of this note might be different from the original. Last Assessment & Plan:  Myeloma treatment is going well, he has upcoming oncology visit   Multiple myeloma in remission (HCC) 07/22/2016   Last Assessment & Plan:  Formatting of this note might be different from the original.  Patient remains in remission, followed by oncology.  Had labs drawn today   Night sweats 01/15/2021   Last Assessment & Plan:   Formatting of this note might be different from the original.    With no signs of  infection or new meds.  UTD with oncology as of last week.  Will check labs and CXR as noted.  Advised to let me know if new symptoms or persists, will consider blood and urine cultures.   Palliative care by specialist    Pancreatitis 03/02/2016   Peripheral neuropathy 07/22/2016   Last Assessment & Plan:  Overall improved. Patient has discontinued gabapentin, did not feel much difference  Feet and fingers.  Multifactorial, DM, Chemo, MM  Last Assessment & Plan:   Doing well on gabapentin Formatting of this note might be different from the original. Last Assessment & Plan:  Overall improved. Patient has discontinued gabapentin, did not feel much difference   Physical deconditioning 06/13/2016   Last Assessment & Plan:  Marked weakness.  Will request further home assistance per home health with bathing.  Discharged on a supplement- patient reports is unaffordable.  Advised prefer food as no barriers, appetite reasonable, ok to use glucerna or carnation instant breakfast if desired.   Pre-operative clearance 01/30/2017   Protein-calorie malnutrition, severe 03/03/2016   Pulmonary embolism (HCC) 09/01/2021   Right hip pain 09/27/2019   Last Assessment & Plan:  Formatting of this note might be different from the original.  Recent tests reassuring for no malignancy.  known hip arthritis.  Symptoms today separate from neuropathy.  He plans on starting program with trainer at [facility], declined PT today for gait and strength.  Will reassess at followup.   Screening for malignant neoplasm of colon 05/02/2020   Sinus tachycardia 05/13/2016   SIRS (systemic inflammatory response syndrome) (HCC) 05/12/2016   Stage 3 chronic kidney disease (HCC) 06/13/2016   Last Assessment & Plan:  Will check Cr/K today  June 2020, annual follow-up [place] [facility]  Last Assessment & Plan:   Recent labs reviewed- stable Formatting of this note might be different from the original. Last Assessment & Plan:  Will check Cr/K today    Thrombocytopenia (HCC) 09/01/2021   Type 2 diabetes mellitus (HCC) 07/21/2011   Last Assessment & Plan:   Diabetes improved with fasting glucoses in a safe range.  Continue current regimen. Formatting  of this note might be different from the original. Last Assessment & Plan:  Patient's diabetes has been well controlled at baseline on 12 units of Lantus daily.  He reports some hyperglycemia on the first few days after dexamethasone with resolution by the end of the week. We'll   Unsteady gait 10/21/2021   Last Assessment & Plan: Formatting of this note might be different from the original.  Will treat cerumen impaction and otorrhea, continue PT.  Likely multifactorial with peripheral neuropathy, BPPV playing a role.  Conitnue PT   Upper abdominal pain    Urinary retention    Vitamin D deficiency 05/02/2020   Weakness generalized     Past Surgical History:  Procedure Laterality Date   CARDIAC CATHETERIZATION  2006   CORONARY ANGIOPLASTY WITH STENT PLACEMENT  2007   IR FLUORO GUIDE CV LINE RIGHT  05/20/2016   IR US GUIDE VASC ACCESS RIGHT  05/20/2016    Family History  Problem Relation Age of Onset   Arrhythmia Mother    Heart disease Mother    Diabetes Sister    Diabetes Brother    Cancer Brother    Stroke Father    Diabetes Brother     Social History   Socioeconomic History   Marital status: Married    Spouse name: Not on file   Number of children: 6   Years of education: Not on file   Highest education level: Not on file  Occupational History   Occupation: retired Optician, dispensing  Tobacco Use   Smoking status: Never   Smokeless tobacco: Never  Vaping Use   Vaping Use: Never used  Substance and Sexual Activity   Alcohol use: No   Drug use: No   Sexual activity: Not Currently  Other Topics Concern   Not on file  Social History Narrative   Not on file   Social Determinants of Health   Financial Resource Strain: Not on file  Food Insecurity: Not on file  Transportation  Needs: Not on file  Physical Activity: Not on file  Stress: Not on file  Social Connections: Not on file  Intimate Partner Violence: Not on file    Review of Systems  Constitutional:  Negative for chills, diaphoresis, fever, malaise/fatigue and weight loss.  HENT:  Negative for congestion, hearing loss, nosebleeds, sore throat and tinnitus.   Eyes:  Negative for blurred vision, photophobia and redness.  Respiratory:  Negative for cough, hemoptysis, sputum production, shortness of breath, wheezing and stridor.   Cardiovascular:  Negative for chest pain, palpitations, orthopnea, claudication, leg swelling and PND.  Gastrointestinal:  Negative for abdominal pain, blood in stool, constipation, diarrhea, heartburn, nausea and vomiting.  Genitourinary:  Negative for dysuria, flank pain, frequency, hematuria and urgency.  Musculoskeletal:  Negative for back pain, falls, joint pain, myalgias and neck pain.  Skin:  Negative for itching and rash.  Neurological:  Positive for tingling and sensory change. Negative for dizziness, tremors, speech change, focal weakness, seizures, loss of consciousness, weakness and headaches.       Symptoms in both feet  Endo/Heme/Allergies:  Negative for environmental allergies and polydipsia. Does not bruise/bleed easily.  Psychiatric/Behavioral:  Negative for depression, memory loss, substance abuse and suicidal ideas. The patient is not nervous/anxious and does not have insomnia.         Objective    BP 135/65   Pulse 64   Ht 5' 8.5" (1.74 m)   Wt 182 lb 12.8 oz (82.9 kg)   SpO2 98%  BMI 27.39 kg/m   Physical Exam Vitals reviewed.  Constitutional:      Appearance: Normal appearance. He is well-developed. He is not diaphoretic.     Comments: Pleasant male  HENT:     Head: Normocephalic and atraumatic.     Nose: No nasal deformity, septal deviation, mucosal edema or rhinorrhea.     Right Sinus: No maxillary sinus tenderness or frontal sinus  tenderness.     Left Sinus: No maxillary sinus tenderness or frontal sinus tenderness.     Mouth/Throat:     Pharynx: No oropharyngeal exudate.  Eyes:     General: No scleral icterus.    Conjunctiva/sclera: Conjunctivae normal.     Pupils: Pupils are equal, round, and reactive to light.  Neck:     Thyroid: No thyromegaly.     Vascular: No carotid bruit or JVD.     Trachea: Trachea normal. No tracheal tenderness or tracheal deviation.  Cardiovascular:     Rate and Rhythm: Normal rate and regular rhythm.     Chest Wall: PMI is not displaced.     Pulses: Normal pulses. No decreased pulses.     Heart sounds: Normal heart sounds, S1 normal and S2 normal. Heart sounds not distant. No murmur heard.    No systolic murmur is present.     No diastolic murmur is present.     No friction rub. No gallop. No S3 or S4 sounds.  Pulmonary:     Effort: Pulmonary effort is normal. No tachypnea, accessory muscle usage or respiratory distress.     Breath sounds: Normal breath sounds. No stridor. No decreased breath sounds, wheezing, rhonchi or rales.  Chest:     Chest wall: No tenderness.  Abdominal:     General: Bowel sounds are normal. There is no distension.     Palpations: Abdomen is soft. Abdomen is not rigid.     Tenderness: There is no abdominal tenderness. There is no guarding or rebound.  Musculoskeletal:        General: Normal range of motion.     Cervical back: Normal range of motion and neck supple. No edema, erythema or rigidity. No muscular tenderness. Normal range of motion.  Lymphadenopathy:     Head:     Right side of head: No submental or submandibular adenopathy.     Left side of head: No submental or submandibular adenopathy.     Cervical: No cervical adenopathy.  Skin:    General: Skin is warm and dry.     Coloration: Skin is not pale.     Findings: No rash.     Nails: There is no clubbing.  Neurological:     Mental Status: He is alert and oriented to person, place, and  time.     Sensory: No sensory deficit.  Psychiatric:        Speech: Speech normal.        Behavior: Behavior normal.         Assessment & Plan:   Problem List Items Addressed This Visit       Cardiovascular and Mediastinum   Arterial insufficiency of lower extremity (HCC)    History of vascular disease seen at [facility] health would like to stay with that clinic has follow-up continue with aspirin and statin      Relevant Medications   ELIQUIS 5 MG TABS tablet   amLODipine (NORVASC) 10 MG tablet   atorvastatin (LIPITOR) 20 MG tablet   lisinopril (ZESTRIL) 2.5 MG tablet  metoprolol succinate (TOPROL-XL) 100 MG 24 hr tablet   Other Relevant Orders   Ambulatory referral to Vascular Surgery   CAD (coronary artery disease)    Coronary artery disease previous stent in LAD wishes to transfer care to our cardiologist referral made continue aspirin and atorvastatin metoprolol      Relevant Medications   ELIQUIS 5 MG TABS tablet   amLODipine (NORVASC) 10 MG tablet   atorvastatin (LIPITOR) 20 MG tablet   lisinopril (ZESTRIL) 2.5 MG tablet   metoprolol succinate (TOPROL-XL) 100 MG 24 hr tablet   Other Relevant Orders   Ambulatory referral to Cardiology   Essential hypertension    Hypertension under good control no change in medication      Relevant Medications   ELIQUIS 5 MG TABS tablet   amLODipine (NORVASC) 10 MG tablet   atorvastatin (LIPITOR) 20 MG tablet   lisinopril (ZESTRIL) 2.5 MG tablet   metoprolol succinate (TOPROL-XL) 100 MG 24 hr tablet   RESOLVED: Pulmonary embolism (HCC)    History of pulmonary embolism due to chemotherapy coming off therapy of pulmonary embolism      Relevant Medications   ELIQUIS 5 MG TABS tablet   amLODipine (NORVASC) 10 MG tablet   atorvastatin (LIPITOR) 20 MG tablet   lisinopril (ZESTRIL) 2.5 MG tablet   metoprolol succinate (TOPROL-XL) 100 MG 24 hr tablet     Digestive   Infected tooth    Needs recurrent dental care will give  amoxicillin prophylaxis prior to dental hygiene and refer to a new dentist      Relevant Orders   Ambulatory referral to Dentistry     Endocrine   Type 2 diabetes mellitus (HCC) - Primary   Relevant Medications   atorvastatin (LIPITOR) 20 MG tablet   LANTUS SOLOSTAR 100 UNIT/ML Solostar Pen   lisinopril (ZESTRIL) 2.5 MG tablet   dapagliflozin propanediol (FARXIGA) 10 MG TABS tablet   Other Relevant Orders   Ambulatory referral to Dentistry   RESOLVED: Diabetes mellitus due to underlying condition, controlled, with renal complication (HCC)    Type 2 diabetes poorly controlled A1c greater than 8.3 he would benefit from a CGM monitor  Will add Farxiga 10 mg daily for renal protection cardiovascular disease and diabetes  Note GFR greater than 40  Increase Lantus to 20 units daily  Refills on all medications sent to his center will mail order pharmacy Check lipid panel        Relevant Medications   atorvastatin (LIPITOR) 20 MG tablet   LANTUS SOLOSTAR 100 UNIT/ML Solostar Pen   lisinopril (ZESTRIL) 2.5 MG tablet   dapagliflozin propanediol (FARXIGA) 10 MG TABS tablet     Musculoskeletal and Integument   Epidermoid cyst of skin    Cyst removed healing benign        Genitourinary   Stage 3 chronic kidney disease (HCC)    Monitor        Hematopoietic and Hemostatic    Reassess PSA        Other   Hyperlipidemia    Reassess lipid continue atorvastatin      Relevant Medications   ELIQUIS 5 MG TABS tablet   amLODipine (NORVASC) 10 MG tablet   atorvastatin (LIPITOR) 20 MG tablet   lisinopril (ZESTRIL) 2.5 MG tablet   metoprolol succinate (TOPROL-XL) 100 MG 24 hr tablet   Other Relevant Orders   Lipid panel   Monoclonal gammopathy    Per oncology this is improved      Multiple myeloma in  remission (HCC)    Care per oncology      Relevant Medications   amoxicillin (AMOXIL) 250 MG capsule   gabapentin (NEURONTIN) 300 MG capsule   Elevated PSA    Reassess  PSA      Relevant Orders   PSA   History of pulmonary embolism    Due to chemotherapy coming off anticoagulation      Other Visit Diagnoses     Prostate cancer screening       Relevant Orders   PSA     60 minutes spent extra time needed reviewing outside records complex conditions multiple prescriptions high degree of risk  Return in about 2 months (around 09/02/2022) for htn, diabetes, Pulmonary embolism, hyperlipidemia, primary care follow up, chronic conditions.   Shan Levans, MD

## 2022-07-03 NOTE — Telephone Encounter (Signed)
Copied from CRM [id number]. Topic: General - Other >> Jul 03, 2022  4:13 PM Clide Dales wrote: Patient's wife called to see if list of foods ok for patient to eat can be sent to their home. Patient's wife said they talked about it during the visit but forgot to get a copy before leaving. Please advise.

## 2022-07-03 NOTE — Assessment & Plan Note (Signed)
Needs recurrent dental care will give amoxicillin prophylaxis prior to dental hygiene and refer to a new dentist

## 2022-07-03 NOTE — Assessment & Plan Note (Signed)
Due to chemotherapy coming off anticoagulation

## 2022-07-03 NOTE — Telephone Encounter (Signed)
Medication sent in already and patient and wife are aware

## 2022-07-04 LAB — LIPID PANEL
Chol/HDL Ratio: 2.5 ratio (ref 0.0–5.0)
Cholesterol, Total: 137 mg/dL (ref 100–199)
HDL: 54 mg/dL (ref >39)
LDL Chol Calc (NIH): 61 mg/dL (ref 0–99)
Triglycerides: 128 mg/dL (ref 0–149)
VLDL Cholesterol Cal: 22 mg/dL (ref 5–40)

## 2022-07-04 LAB — PSA: Prostate Specific Ag, Serum: 10.8 ng/mL — ABNORMAL HIGH (ref 0.0–4.0)

## 2022-07-04 NOTE — Telephone Encounter (Signed)
Called and left voicemail will mail out today

## 2022-07-04 NOTE — Telephone Encounter (Signed)
On back color printer are more documents printed on lifestyle medicine  Pls sent to the [facility] by mail the english version and calorie density one   Let them know it is coming

## 2022-07-08 NOTE — Progress Notes (Signed)
Let pt know his PSA is up to 10  last checked in 2017 and was 3.2  ask him which urology MD has seen if any.  I did call him this weekend on all of the other results

## 2022-07-09 ENCOUNTER — Telehealth: Payer: Self-pay

## 2022-07-09 NOTE — Telephone Encounter (Signed)
Pt was called and vm was left, Information has been sent to nurse pool.   

## 2022-07-09 NOTE — Telephone Encounter (Signed)
-----   Message from Storm Frisk, MD sent at 07/08/2022  5:18 AM EDT ----- Let pt know his PSA is up to 10  last checked in 2017 and was 3.2  ask him which urology MD has seen if any.  I did call him this weekend on all of the other results

## 2022-07-21 ENCOUNTER — Telehealth: Payer: Self-pay | Admitting: Critical Care Medicine

## 2022-07-21 NOTE — Telephone Encounter (Signed)
Copied from CRM [id number]. Topic: General - Other >> Jul 21, 2022 10:43 AM Clide Dales wrote: Patient called to speak with Dr. Delford Field about an alternative for [place]. Patient states that the medication is too expensive. Please advise.

## 2022-07-22 NOTE — Telephone Encounter (Signed)
Any help Douglas Rose on this?

## 2022-07-22 NOTE — Telephone Encounter (Signed)
Yes sir. With Medicare, we can apply for patient assistance for him. Routing to [employer]. She will be able to take a look when she returns from [facility].

## 2022-07-28 ENCOUNTER — Other Ambulatory Visit: Payer: Self-pay | Admitting: Pharmacist

## 2022-07-28 ENCOUNTER — Other Ambulatory Visit: Payer: Self-pay

## 2022-07-28 MED ORDER — DAPAGLIFLOZIN PROPANEDIOL 10 MG PO TABS
10.0000 mg | ORAL_TABLET | Freq: Every day | ORAL | 0 refills | Status: DC
  Filled 2022-07-28: qty 30, 30d supply, fill #0

## 2022-07-30 ENCOUNTER — Ambulatory Visit: Payer: Medicare PPO | Attending: Critical Care Medicine

## 2022-07-30 ENCOUNTER — Ambulatory Visit: Payer: Medicare PPO

## 2022-07-30 VITALS — Ht 68.5 in | Wt 182.0 lb

## 2022-07-30 DIAGNOSIS — Z0000 Encounter for general adult medical examination without abnormal findings: Secondary | ICD-10-CM

## 2022-07-30 NOTE — Patient Instructions (Signed)
[name] , Thank you for taking time to come for your Medicare Wellness Visit. I appreciate your ongoing commitment to your health goals. Please review the following plan we discussed and let me know if I can assist you in the future.   These are the goals we discussed:  Goals      Remain active and independent        This is a list of the screening recommended for you and due dates:  Health Maintenance  Topic Date Due   Eye exam for diabetics  Never done   Zoster (Shingles) Vaccine (1 of 2) Never done   COVID-19 Vaccine (5 - 2023-24 season) 09/06/2021   Yearly kidney health urinalysis for diabetes  07/03/2027*   Flu Shot  08/07/2022   Hemoglobin A1C  10/29/2022   Yearly kidney function blood test for diabetes  06/03/2023   Complete foot exam   07/03/2023   Medicare Annual Wellness Visit  07/30/2023   DTaP/Tdap/Td vaccine (4 - Td or Tdap) 12/17/2025   Pneumonia Vaccine  Completed   Hepatitis C Screening  Completed   HPV Vaccine  Aged Out  *Topic was postponed. The date shown is not the original due date.    Advanced directives: Information on Advanced Care Planning can be found at [facility] of [facility] Advance Health Care Directives Advance Health Care Directives ([email or web address] Please bring a copy of your health care power of attorney and living will to the office to be added to your chart at your convenience.  Conditions/risks identified: Aim for 30 minutes of exercise or brisk walking, 6-8 glasses of water, and 5 servings of fruits and vegetables each day.  Next appointment: Follow up in one year for your annual wellness visit.   Preventive Care 79 Years and Older, Male  Preventive care refers to lifestyle choices and visits with your health care provider that can promote health and wellness. What does preventive care include? A yearly physical exam. This is also called an annual well check. Dental exams once or twice a year. Routine eye exams. Ask your health  care provider how often you should have your eyes checked. Personal lifestyle choices, including: Daily care of your teeth and gums. Regular physical activity. Eating a healthy diet. Avoiding tobacco and drug use. Limiting alcohol use. Practicing safe sex. Taking low doses of aspirin every day. Taking vitamin and mineral supplements as recommended by your health care provider. What happens during an annual well check? The services and screenings done by your health care provider during your annual well check will depend on your age, overall health, lifestyle risk factors, and family history of disease. Counseling  Your health care provider may ask you questions about your: Alcohol use. Tobacco use. Drug use. Emotional well-being. Home and relationship well-being. Sexual activity. Eating habits. History of falls. Memory and ability to understand (cognition). Work and work Astronomer. Screening  You may have the following tests or measurements: Height, weight, and BMI. Blood pressure. Lipid and cholesterol levels. These may be checked every 5 years, or more frequently if you are over 21 years old. Skin check. Lung cancer screening. You may have this screening every year starting at age 29 if you have a 30-pack-year history of smoking and currently smoke or have quit within the past 15 years. Fecal occult blood test (FOBT) of the stool. You may have this test every year starting at age 30. Flexible sigmoidoscopy or colonoscopy. You may have a sigmoidoscopy every 5 years  or a colonoscopy every 10 years starting at age 53. Prostate cancer screening. Recommendations will vary depending on your family history and other risks. Hepatitis C blood test. Hepatitis B blood test. Sexually transmitted disease (STD) testing. Diabetes screening. This is done by checking your blood sugar (glucose) after you have not eaten for a while (fasting). You may have this done every 1-3 years. Abdominal  aortic aneurysm (AAA) screening. You may need this if you are a current or former smoker. Osteoporosis. You may be screened starting at age 31 if you are at high risk. Talk with your health care provider about your test results, treatment options, and if necessary, the need for more tests. Vaccines  Your health care provider may recommend certain vaccines, such as: Influenza vaccine. This is recommended every year. Tetanus, diphtheria, and acellular pertussis (Tdap, Td) vaccine. You may need a Td booster every 10 years. Zoster vaccine. You may need this after age 64. Pneumococcal 13-valent conjugate (PCV13) vaccine. One dose is recommended after age 85. Pneumococcal polysaccharide (PPSV23) vaccine. One dose is recommended after age 58. Talk to your health care provider about which screenings and vaccines you need and how often you need them. This information is not intended to replace advice given to you by your health care provider. Make sure you discuss any questions you have with your health care provider. Document Released: 01/19/2015 Document Revised: 09/12/2015 Document Reviewed: 10/24/2014 Elsevier Interactive Patient Education  2017 [employer].  Fall Prevention in the Home Falls can cause injuries. They can happen to people of all ages. There are many things you can do to make your home safe and to help prevent falls. What can I do on the outside of my home? Regularly fix the edges of walkways and driveways and fix any cracks. Remove anything that might make you trip as you walk through a door, such as a raised step or threshold. Trim any bushes or trees on the path to your home. Use bright outdoor lighting. Clear any walking paths of anything that might make someone trip, such as rocks or tools. Regularly check to see if handrails are loose or broken. Make sure that both sides of any steps have handrails. Any raised decks and porches should have guardrails on the edges. Have any  leaves, snow, or ice cleared regularly. Use sand or salt on walking paths during winter. Clean up any spills in your garage right away. This includes oil or grease spills. What can I do in the bathroom? Use night lights. Install grab bars by the toilet and in the tub and shower. Do not use towel bars as grab bars. Use non-skid mats or decals in the tub or shower. If you need to sit down in the shower, use a plastic, non-slip stool. Keep the floor dry. Clean up any water that spills on the floor as soon as it happens. Remove soap buildup in the tub or shower regularly. Attach bath mats securely with double-sided non-slip rug tape. Do not have throw rugs and other things on the floor that can make you trip. What can I do in the bedroom? Use night lights. Make sure that you have a light by your bed that is easy to reach. Do not use any sheets or blankets that are too big for your bed. They should not hang down onto the floor. Have a firm chair that has side arms. You can use this for support while you get dressed. Do not have throw rugs and  other things on the floor that can make you trip. What can I do in the kitchen? Clean up any spills right away. Avoid walking on wet floors. Keep items that you use a lot in easy-to-reach places. If you need to reach something above you, use a strong step stool that has a grab bar. Keep electrical cords out of the way. Do not use floor polish or wax that makes floors slippery. If you must use wax, use non-skid floor wax. Do not have throw rugs and other things on the floor that can make you trip. What can I do with my stairs? Do not leave any items on the stairs. Make sure that there are handrails on both sides of the stairs and use them. Fix handrails that are broken or loose. Make sure that handrails are as long as the stairways. Check any carpeting to make sure that it is firmly attached to the stairs. Fix any carpet that is loose or worn. Avoid  having throw rugs at the top or bottom of the stairs. If you do have throw rugs, attach them to the floor with carpet tape. Make sure that you have a light switch at the top of the stairs and the bottom of the stairs. If you do not have them, ask someone to add them for you. What else can I do to help prevent falls? Wear shoes that: Do not have high heels. Have rubber bottoms. Are comfortable and fit you well. Are closed at the toe. Do not wear sandals. If you use a stepladder: Make sure that it is fully opened. Do not climb a closed stepladder. Make sure that both sides of the stepladder are locked into place. Ask someone to hold it for you, if possible. Clearly mark and make sure that you can see: Any grab bars or handrails. First and last steps. Where the edge of each step is. Use tools that help you move around (mobility aids) if they are needed. These include: Canes. Walkers. Scooters. Crutches. Turn on the lights when you go into a dark area. Replace any light bulbs as soon as they burn out. Set up your furniture so you have a clear path. Avoid moving your furniture around. If any of your floors are uneven, fix them. If there are any pets around you, be aware of where they are. Review your medicines with your doctor. Some medicines can make you feel dizzy. This can increase your chance of falling. Ask your doctor what other things that you can do to help prevent falls. This information is not intended to replace advice given to you by your health care provider. Make sure you discuss any questions you have with your health care provider. Document Released: 10/19/2008 Document Revised: 05/31/2015 Document Reviewed: 01/27/2014 Elsevier Interactive Patient Education  2017 [employer]. anticoagulation

## 2022-07-30 NOTE — Progress Notes (Signed)
Subjective:   Douglas Rose. is a 77 y.o. male who presents for an Initial Medicare Annual Wellness Visit.  Visit Complete: Virtual  I connected with  Douglas Rose. on 07/30/22 by a audio enabled telemedicine application and verified that I am speaking with the correct person using two identifiers.  Patient Location: Home  Provider Location: Home Office  I discussed the limitations of evaluation and management by telemedicine. The patient expressed understanding and agreed to proceed.  Per patient no change in vitals since last visit; unable to obtain new vitals due to this being a telehealth visit.  Patient was unable to self-report vital signs via telehealth due to a lack of equipment at home.  Review of Systems     Cardiac Risk Factors include: diabetes mellitus;advanced age (>51men, >7 women);hypertension;male gender;dyslipidemia     Objective:    Today's Vitals   07/30/22 1327  Weight: 182 lb (82.6 kg)  Height: 5' 8.5" (1.74 m)   Body mass index is 27.27 kg/m.     07/30/2022    3:21 PM 05/23/2022   11:08 AM 07/17/2021    8:53 PM 03/15/2019    9:42 AM 12/09/2017    2:23 PM 09/30/2017    3:13 PM 05/20/2017    4:18 PM  Advanced Directives  Does Patient Have a Medical Advance Directive? No No No      Type of Advance Directive         Does patient want to make changes to medical advance directive?         Would patient like information on creating a medical advance directive? Yes (MAU/Ambulatory/Procedural Areas - Information given) No - Patient declined No - Patient declined         Information is confidential and restricted. Go to Review Flowsheets to unlock data.    Current Medications (verified) Outpatient Encounter Medications as of 07/30/2022  Medication Sig   amLODipine (NORVASC) 10 MG tablet Take 1 tablet (10 mg total) by mouth daily.   amoxicillin (AMOXIL) 250 MG capsule Once before dental procedures   aspirin EC 81 MG tablet Take 1 tablet (81 mg  total) by mouth daily. Swallow whole.   atorvastatin (LIPITOR) 20 MG tablet Take 1 tablet (20 mg total) by mouth daily.   Cholecalciferol 10 MCG (400 UNIT) CAPS Take 1 capsule by mouth daily.   dapagliflozin propanediol (FARXIGA) 10 MG TABS tablet Take 1 tablet (10 mg total) by mouth daily before breakfast.   ELIQUIS 5 MG TABS tablet Take 5 mg by mouth 2 (two) times daily.   gabapentin (NEURONTIN) 300 MG capsule Take 1 capsule (300 mg total) by mouth daily.   LANTUS SOLOSTAR 100 UNIT/ML Solostar Pen Inject 20 Units into the skin daily.   lisinopril (ZESTRIL) 2.5 MG tablet Take 1 tablet (2.5 mg total) by mouth daily.   metoprolol succinate (TOPROL-XL) 100 MG 24 hr tablet Take 1 tablet (100 mg total) by mouth daily.   nitroGLYCERIN (NITROSTAT) 0.4 MG SL tablet Place 1 tablet (0.4 mg total) under the tongue every 5 (five) minutes x 3 doses as needed for chest pain.   vitamin B-12 (CYANOCOBALAMIN) 1000 MCG tablet Take 1,000 mcg by mouth daily.   No facility-administered encounter medications on file as of 07/30/2022.    Allergies (verified) Patient has no known allergies.   History: Past Medical History:  Diagnosis Date   Abdominal fullness 02/26/2016   Last Assessment & Plan:  overall improving with no pain, no further nausea and  normal BM today.  No signs of obstruction or infection today.  Due for 10 year colonoscopy, mild anemia in December, will refer to GI   Acute renal failure (HCC) 03/06/2016   Acute renal failure superimposed on chronic kidney disease (HCC) 03/01/2016   AKI (acute kidney injury) (HCC)    Arterial insufficiency of lower extremity (HCC) 09/01/2016   Overview:  Mild (2018)  Last Assessment & Plan:  Mild arterial insufficiency, he does not have classic symptoms of claudication. Advised daily foot checks and would consider further evaluation if new or worsening symptoms  Mild (2018)  Last Assessment & Plan:  asymptomatic.  We will follow-up as needed Formatting of this note  might be different from the original. Mild (2018)  Last Assessment & Pla   Atherosclerotic heart disease of native coronary artery without angina pectoris 05/02/2020   Benign hypertension 07/21/2011   Last Assessment & Plan:  Formatting of this note might be different from the original.  We discussed blood pressure above goal today. Previously well controlled. I recommended he continue current medications, focus on lifestyle modifications including low-sodium diet and will reassess in 3 months   Benign prostatic hyperplasia 05/02/2020   CAD (coronary artery disease) 2006   stent LAD 3.0x18 Cypher, occluded RCA   Cancer (HCC)    multiple myeloma   Cerumen impaction 10/21/2021   Last Assessment & Plan: Formatting of this note might be different from the original.  Right cerumen impaction cleared with irrigation.  Left with yellow appearing discharge,  patient reports he also recently been flushing it at home. Patient denies any pain.  Concern for immunocompromised, chronic otorrhea, will rx ofloxacin drops per formualry   Cervical lymphadenopathy 03/31/2016   Colitis 05/12/2016   Coronary artery disease 07/21/2011   stent LAD 3.0x18 Cypher, occluded RCA  Overview:  Stent 2006 at [facility], Low risk stress test feb 2017  Last Assessment & Plan:  Declined further stress testing in 2015 due to cost and absence of symptoms.  Continues to be asymptomatic.  He has upcoming follow-up with cardiology for review. Encouraged to keep appt  Stent 2006 at [facility], Low risk s   Diabetes mellitus without complication [facility])    DNR (do not resuscitate) discussion    Elevated PSA 01/24/2015   Last Assessment & Plan: Formatting of this note might be different from the original.  Reports he has a pending biopsy which will be rescheduled due to recent DVT.  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  Patient is up-to-date on Covid booster   Infected tooth 03/31/2016   Leukocytosis    Lipoma 07/19/2014   Overview:  Right scalp.  Seen by Vaughan Sine in past  Last Assessment & Plan:  Lipoma on right scalp getting larger, will refer to ENT, but more concerned about lymph node   Lymphadenopathy 11/21/2015   Last Assessment & Plan:  Right submandibular lymph node, non-tender,  now larger. CBC 2 months ago showed mild anemia, normal peripheral smear.  Will recheck today along with ESR, sed rate.  Will refer to ENT for further evaluation.    Last Assessment & Plan:   Soft, right-sided postauricular mass, deferred intervention in 2018 at onset of myeloma diagnosis, gradually enlarging over time.  Had cons   Monoclonal gammopathy    Multiple myeloma (HCC) 03/20/2016   Last Assessment & Plan:  Given dx, rec social distancing, avoiding in person jury duty.  Letter provided Formatting of this note might be different from the original. Last Assessment & Plan:  Myeloma treatment is going well, he has upcoming oncology visit   Multiple myeloma in remission (HCC) 07/22/2016   Last Assessment & Plan:  Formatting of this note might be different from the original.  Patient remains in remission, followed by oncology.  Had labs drawn today   Night sweats 01/15/2021   Last Assessment & Plan:   Formatting of this note might be different from the original.    With no signs of infection or new meds.  UTD with oncology as of last week.  Will check labs and CXR as  noted.  Advised to let me know if new symptoms or persists, will consider blood and urine cultures.   Palliative care by specialist    Pancreatitis 03/02/2016   Peripheral neuropathy 07/22/2016   Last Assessment & Plan:  Overall improved. Patient has discontinued gabapentin, did not feel much difference  Feet and fingers.  Multifactorial, DM, Chemo, MM  Last Assessment & Plan:   Doing well on gabapentin Formatting of this note might be different from the original. Last Assessment & Plan:  Overall improved. Patient has discontinued gabapentin, did not feel much difference   Physical deconditioning 06/13/2016   Last Assessment & Plan:  Marked weakness.  Will request further home assistance per home health with bathing.  Discharged on a supplement- patient reports is unaffordable.  Advised prefer food as no barriers, appetite reasonable, ok to use glucerna or carnation instant breakfast if desired.   Pre-operative clearance 01/30/2017   Protein-calorie malnutrition, severe 03/03/2016   Pulmonary embolism (HCC) 09/01/2021   Right hip pain 09/27/2019   Last Assessment & Plan:  Formatting of this note might be different from the original.  Recent tests reassuring for no malignancy.  known hip arthritis.  Symptoms today separate from neuropathy.  He plans on starting program with trainer at [facility], declined PT today for gait and strength.  Will reassess at followup.   Screening for malignant neoplasm of colon 05/02/2020   Sinus tachycardia 05/13/2016   SIRS (systemic inflammatory response syndrome) (HCC) 05/12/2016   Stage 3 chronic kidney disease (HCC) 06/13/2016   Last Assessment & Plan:  Will check Cr/K today  June 2020, annual follow-up [place] [facility]  Last Assessment & Plan:   Recent labs reviewed- stable Formatting of this note might be different from the original. Last Assessment & Plan:  Will check Cr/K today   Thrombocytopenia (HCC) 09/01/2021   Type 2 diabetes mellitus (HCC) 07/21/2011   Last  Assessment & Plan:   Diabetes improved with fasting glucoses in a safe range.  Continue current regimen. Formatting of this note might be different from the original. Last Assessment & Plan:  Patient's diabetes has been well controlled at baseline on 12 units of Lantus daily.  He reports some hyperglycemia on the first few days after dexamethasone with resolution by the end of the week. We'll   Unsteady  gait 10/21/2021   Last Assessment & Plan: Formatting of this note might be different from the original.  Will treat cerumen impaction and otorrhea, continue PT.  Likely multifactorial with peripheral neuropathy, BPPV playing a role.  Conitnue PT   Upper abdominal pain    Urinary retention    Vitamin D deficiency 05/02/2020   Weakness generalized    Past Surgical History:  Procedure Laterality Date   CARDIAC CATHETERIZATION  2006   CORONARY ANGIOPLASTY WITH STENT PLACEMENT  2007   IR FLUORO GUIDE CV LINE RIGHT  05/20/2016   IR US GUIDE VASC ACCESS RIGHT  05/20/2016   Family History  Problem Relation Age of Onset   Arrhythmia Mother    Heart disease Mother    Diabetes Sister    Diabetes Brother    Cancer Brother    Stroke Father    Diabetes Brother    Social History   Socioeconomic History   Marital status: Married    Spouse name: Not on file   Number of children: 6   Years of education: Not on file   Highest education level: Not on file  Occupational History   Occupation: retired Optician, dispensing  Tobacco Use   Smoking status: Never   Smokeless tobacco: Never  Vaping Use   Vaping status: Never Used  Substance and Sexual Activity   Alcohol use: No   Drug use: No   Sexual activity: Not Currently  Other Topics Concern   Not on file  Social History Narrative   Not on file   Social Determinants of Health   Financial Resource Strain: Low Risk  (07/30/2022)   Overall Financial Resource Strain (CARDIA)    Difficulty of Paying Living Expenses: Not hard at all  Food Insecurity: No Food  Insecurity (07/30/2022)   Hunger Vital Sign    Worried About Running Out of Food in the Last Year: Never true    Ran Out of Food in the Last Year: Never true  Transportation Needs: No Transportation Needs (07/30/2022)   PRAPARE - Administrator, Civil Service (Medical): No    Lack of Transportation (Non-Medical): No  Physical Activity: Insufficiently Active (07/30/2022)   Exercise Vital Sign    Days of Exercise per Week: 3 days    Minutes of Exercise per Session: 30 min  Stress: No Stress Concern Present (07/30/2022)   [employer] of Occupational Health - Occupational Stress Questionnaire    Feeling of Stress : Not at all  Social Connections: Moderately Integrated (07/30/2022)   Social Connection and Isolation Panel [NHANES]    Frequency of Communication with Friends and Family: More than three times a week    Frequency of Social Gatherings with Friends and Family: Three times a week    Attends Religious Services: More than 4 times per year    Active Member of Clubs or Organizations: No    Attends Banker Meetings: Never    Marital Status: Married    Tobacco Counseling Counseling given: Not Answered   Clinical Intake:  Pre-visit preparation completed: Yes  Pain : No/denies pain     Diabetes: Yes CBG done?: No Did pt. bring in CBG monitor from home?: No  How often do you need to have someone help you when you read instructions, pamphlets, or other written materials from your doctor or pharmacy?: 1 - Never  Interpreter Needed?: No  Information entered by :: Kandis Fantasia LPN   Activities of Daily Living    07/30/2022  3:16 PM 05/23/2022   11:07 AM  In your present state of health, do you have any difficulty performing the following activities:  Hearing? 0 0  Vision? 0 0  Difficulty concentrating or making decisions? 0 0  Walking or climbing stairs? 0 0  Dressing or bathing? 0 0  Doing errands, shopping? 0   Preparing Food and eating  ? N   Using the Toilet? N   In the past six months, have you accidently leaked urine? N   Do you have problems with loss of bowel control? N   Managing your Medications? N   Managing your Finances? N   Housekeeping or managing your Housekeeping? N     Patient Care Team: Storm Frisk, MD as PCP - General (Pulmonary Disease) Johney Maine, MD as Consulting Physician (Hematology) Illene Labrador, OD (Optometry)  Indicate any recent Medical Services you may have received from other than [facility] providers in the past year (date may be approximate).     Assessment:   This is a routine wellness examination for Douglas Rose.  Hearing/Vision screen Hearing Screening - Comments:: Denies hearing difficulties   Vision Screening - Comments:: Wears rx glasses - up to date with routine eye exams with [facility] ([place])   Dietary issues and exercise activities discussed:     Goals Addressed             This Visit's Progress    Remain active and independent        Depression Screen    07/30/2022    3:20 PM 03/06/2016    3:04 PM  PHQ 2/9 Scores  PHQ - 2 Score 0 0    Fall Risk    07/30/2022    3:28 PM 07/03/2022   11:06 AM 03/06/2016    3:04 PM  Fall Risk   Falls in the past year? 0 0 No  Number falls in past yr: 0 0   Injury with Fall? 0 0   Risk for fall due to : No Fall Risks No Fall Risks   Follow up Falls prevention discussed;Education provided;Falls evaluation completed      MEDICARE RISK AT HOME:  Medicare Risk at Home - 07/30/22 1528     Any stairs in or around the home? No    If so, are there any without handrails? No    Home free of loose throw rugs in walkways, pet beds, electrical cords, etc? Yes    Adequate lighting in your home to reduce risk of falls? Yes    Life alert? No    Use of a cane, walker or w/c? No    Grab bars in the bathroom? Yes    Shower chair or bench in shower? Yes    Elevated toilet seat or a handicapped toilet? Yes              TIMED UP AND GO:  Was the test performed? No    Cognitive Function:        07/30/2022    3:28 PM  6CIT Screen  What Year? 0 points  What month? 0 points  What time? 0 points  Count back from 20 0 points  Months in reverse 0 points  Repeat phrase 0 points  Total Score 0 points    Immunizations Immunization History  Administered Date(s) Administered   DT (Pediatric) 03/05/2001   Fluad Quad(high Dose 65+) 09/29/2017, 09/27/2019, 10/16/2020, 11/07/2021   Influenza Split 10/14/2000, 10/07/2007, 09/06/2008, 10/06/2009   Influenza, High  Dose Seasonal PF 12/07/2018   Influenza, Seasonal, Injecte, Preservative Fre 10/15/2016   Influenza,inj,Quad PF,6+ Mos 10/15/2016   Influenza,trivalent, recombinat, inj, PF 10/14/2000, 10/07/2007, 09/06/2008, 10/06/2009   Influenza-Unspecified 10/14/2000, 10/07/2007, 09/06/2008, 10/06/2009, 10/15/2016, 12/07/2018   PFIZER(Purple Top)SARS-COV-2 Vaccination 03/07/2019, 04/06/2019, 10/22/2019, 10/04/2020   Pneumococcal Conjugate-13 07/19/2014   Pneumococcal Polysaccharide-23 04/28/2007, 11/20/2015   Pneumococcal-Unspecified 04/28/2007   Tdap 01/07/2003, 12/18/2015    TDAP status: Up to date  Pneumococcal vaccine status: Up to date  Covid-19 vaccine status: Information provided on how to obtain vaccines.   Qualifies for Shingles Vaccine? Yes   Zostavax completed No   Shingrix Completed?: No.    Education has been provided regarding the importance of this vaccine. Patient has been advised to call insurance company to determine out of pocket expense if they have not yet received this vaccine. Advised may also receive vaccine at local pharmacy or Health Dept. Verbalized acceptance and understanding.  Screening Tests Health Maintenance  Topic Date Due   OPHTHALMOLOGY EXAM  Never done   Zoster Vaccines- Shingrix (1 of 2) Never done   COVID-19 Vaccine (5 - 2023-24 season) 09/06/2021   Diabetic kidney evaluation - Urine ACR  07/03/2027  (Originally 11/29/1963)   INFLUENZA VACCINE  08/07/2022   HEMOGLOBIN A1C  10/29/2022   Diabetic kidney evaluation - eGFR measurement  06/03/2023   FOOT EXAM  07/03/2023   Medicare Annual Wellness (AWV)  07/30/2023   DTaP/Tdap/Td (4 - Td or Tdap) 12/17/2025   Pneumonia Vaccine 48+ Years old  Completed   Hepatitis C Screening  Completed   HPV VACCINES  Aged Out    Health Maintenance  Health Maintenance Due  Topic Date Due   OPHTHALMOLOGY EXAM  Never done   Zoster Vaccines- Shingrix (1 of 2) Never done   COVID-19 Vaccine (5 - 2023-24 season) 09/06/2021    Colorectal cancer screening: No longer required.   Lung Cancer Screening: (Low Dose CT Chest recommended if Age 4-80 years, 20 pack-year currently smoking OR have quit w/in 15years.) does not qualify.   Lung Cancer Screening Referral: n/a  Additional Screening:  Hepatitis C Screening: does qualify; Completed 05/21/16  Vision Screening: Recommended annual ophthalmology exams for early detection of glaucoma and other disorders of the eye. Is the patient up to date with their annual eye exam?  Yes  Who is the provider or what is the name of the office in which the patient attends annual eye exams? Dr. San Morelle ([facility])  If pt is not established with a provider, would they like to be referred to a provider to establish care? No .   Dental Screening: Recommended annual dental exams for proper oral hygiene  Diabetic Foot Exam: Diabetic Foot Exam: Overdue, Pt has been advised about the importance in completing this exam. Pt is scheduled for diabetic foot exam on at next office visit.  Community Resource Referral / Chronic Care Management: CRR required this visit?  No   CCM required this visit?  No    Plan:     I have personally reviewed and noted the following in the patient's chart:   Medical and social history Use of alcohol, tobacco or illicit drugs  Current medications and supplements including  opioid prescriptions. Patient is not currently taking opioid prescriptions. Functional ability and status Nutritional status Physical activity Advanced directives List of other physicians Hospitalizations, surgeries, and ER visits in previous 12 months Vitals Screenings to include cognitive, depression, and falls Referrals and appointments  In addition,  I have reviewed and discussed with patient certain preventive protocols, quality metrics, and best practice recommendations. A written personalized care plan for preventive services as well as general preventive health recommendations were provided to patient.     Kandis Fantasia [place], [place]   5/62/1308   After Visit Summary: (MyChart) Due to this being a telephonic visit, the after visit summary with patients personalized plan was offered to patient via MyChart   Nurse Notes: No concerns at this time

## 2022-08-04 ENCOUNTER — Other Ambulatory Visit: Payer: Self-pay

## 2022-08-06 ENCOUNTER — Other Ambulatory Visit: Payer: Self-pay

## 2022-08-08 ENCOUNTER — Other Ambulatory Visit: Payer: Self-pay

## 2022-08-13 ENCOUNTER — Other Ambulatory Visit: Payer: Self-pay

## 2022-09-01 ENCOUNTER — Encounter: Payer: Self-pay | Admitting: Physician Assistant

## 2022-09-01 ENCOUNTER — Ambulatory Visit (HOSPITAL_COMMUNITY)
Admission: RE | Admit: 2022-09-01 | Discharge: 2022-09-01 | Disposition: A | Discharge status: Home / Self Care | Payer: Medicare PPO | Source: Ambulatory Visit | Attending: Physician Assistant | Admitting: Physician Assistant

## 2022-09-01 ENCOUNTER — Ambulatory Visit: Payer: Medicare PPO | Admitting: Physician Assistant

## 2022-09-01 VITALS — BP 148/78 | HR 74 | Ht 68.0 in | Wt 178.0 lb

## 2022-09-01 DIAGNOSIS — M79644 Pain in right finger(s): Secondary | ICD-10-CM | POA: Insufficient documentation

## 2022-09-01 DIAGNOSIS — I10 Essential (primary) hypertension: Secondary | ICD-10-CM

## 2022-09-01 NOTE — Patient Instructions (Signed)
I encourage you to keep your thumb clean and dry, also use ice to help with the swelling.  We will call you with the results of your x-ray when able.  Please let us know if there is anything else we can do for you.  Roney Jaffe, PA-C Physician Assistant [facility] [email or web address]   Nail Bed Injury The nail bed is the soft tissue under a fingernail or toenail that lets the nail grow. The nail bed can be injured in many ways. These injuries may cause: Bruising or bleeding under the nail. Cuts in the nail or nail bed. Loss of a part of the nail or the whole nail. This is called avulsion. If the growth center of the nail bed is damaged, the injured nail may not grow back normally or it may not grow at all. The regrown nail might have a different shape or appearance. It can take several months for a damaged or torn-off nail to regrow. Depending on the injury, the nail may never grow back normally. What are the causes? This condition is usually caused when the nail on a fingertip or toe gets crushed, pinched, cut, or torn. These injuries might happen when a finger or toe gets: Caught in a door. Hit by a hammer. Damaged in accidents while you are using power tools or machinery. What are the signs or symptoms? Symptoms vary depending on the type of injury. Symptoms may include: Pain in the injured area. Bleeding. Swelling. A change in color of the area. Collection of blood under the nail (hematoma). Damage to the nail, such as: A nail that is an unusual shape. A split nail. A loose nail that is not stuck to the nail bed. Loss of all or part of the nail. In some cases, a nail bed injury happens along with another injury, such as a break (fracture) of the bone at the tip of the finger or toe. How is this diagnosed? This condition is diagnosed based on: Your medical history and description of how the injury occurred. A  physical exam. Your health care provider will check for a loose nail or a laceration of the nail bed. X-rays. These may be done to see if you have a fracture. Your provider might also check for conditions that may affect healing, such as diabetes, nerve problems, or poor circulation. How is this treated? Treatment for this condition may depend on the type of injury. Sometimes, the injury may not require any treatment other than keeping the area clean. Treatment may include: Draining blood from under the nail. This can be done by making a small hole in the nail. Removing all or part of your nail. This might be done to stitch (suture) any cut in the nail bed. Stitching a torn-off nail back in place. Depending on the location and size of the nail bed injury, this is sometimes done to provide temporary protection to the nail bed until the new nail grows in. Putting bandages (dressings) or splints on the area. Taking medicines, such as: Antibiotics to help prevent infection. Pain medicine. Having a tetanus shot. This may be needed if you have not had a tetanus shot in the last 10 years. For some injuries, your provider may tell you to see a hand or foot specialist. Follow these instructions at home: Managing pain, stiffness, and swelling Raise (elevate) the injured area above the level of your heart while you are sitting or lying down. Keep your injury protected with dressings  or splints as told by your provider. For an injured toenail: Limit walking on your injured foot. Wear an open-toed shoe when you walk. Try not to let your leg hang down (dangle) while you are sitting or lying down. Wound care Follow instructions from your provider about how to take care of your wound. Make sure you: Wash your hands with soap and water for at least 20 seconds before and after you change your bandage (dressing). If soap and water are not available, use hand sanitizer. Change your dressing as told by your  provider. Leave stitches (sutures), skin glue, or tape strips in place. These skin closures may need to stay in place for 2 weeks or longer. If tape strip edges start to loosen and curl up, you may trim the loose edges. Do not remove tape strips completely unless your provider tells you to do that. Check the injured area every day for signs of infection. Check for: More redness, swelling, or pain. More fluid or blood. Warmth. Pus or a bad smell. Keep the injured area clean. Keep dressings clean and dry. General instructions Take over-the-counter and prescription medicines only as told by your provider. If you were prescribed antibiotics, take them as told by your provider. Do not stop using the antibiotic even if you start to feel better. Contact a health care provider if: You have pain that is not controlled with medicine. You have signs of infection around your injured nail. You have a fever and your symptoms get worse. You have numbness in your finger or toe. Your finger or toe turns blue. This information is not intended to replace advice given to you by your health care provider. Make sure you discuss any questions you have with your health care provider. Document Revised: 09/11/2021 Document Reviewed: 09/11/2021 Elsevier Patient Education  2024 [employer].

## 2022-09-01 NOTE — Progress Notes (Signed)
Established Patient Office Visit  Subjective   Patient ID: Douglas Mctee., male    DOB: Dec 08, 1945  Age: 77 y.o. MRN: [id number]  Chief Complaint  Patient presents with   Hand Injury    Right thumb injury     States that he accidentally slammed his right thumb in the car door yesterday afternoon.  States that he did have some blood from his fingernail area, states that he has also been experiencing some swelling.  Does endorse discomfort when trying to bend thumb .  States he has not tried anything for relief.  States that he does check his blood pressure at home on a regular basis, states his readings are normally 110/70.     Past Medical History:  Diagnosis Date   Abdominal fullness 02/26/2016   Last Assessment & Plan:  overall improving with no pain, no further nausea and normal BM today.  No signs of obstruction or infection today.  Due for 10 year colonoscopy, mild anemia in December, will refer to GI   Acute renal failure (HCC) 03/06/2016   Acute renal failure superimposed on chronic kidney disease (HCC) 03/01/2016   AKI (acute kidney injury) (HCC)    Arterial insufficiency of lower extremity (HCC) 09/01/2016   Overview:  Mild (2018)  Last Assessment & Plan:  Mild arterial insufficiency, he does not have classic symptoms of claudication. Advised daily foot checks and would consider further evaluation if new or worsening symptoms  Mild (2018)  Last Assessment & Plan:  asymptomatic.  We will follow-up as needed Formatting of this note might be different from the original. Mild (2018)  Last Assessment & Pla   Atherosclerotic heart disease of native coronary artery without angina pectoris 05/02/2020   Benign hypertension 07/21/2011   Last Assessment & Plan:  Formatting of this note might be different from the original.  We discussed blood pressure above goal today. Previously well controlled. I recommended he continue current medications, focus on lifestyle modifications  including low-sodium diet and will reassess in 3 months   Benign prostatic hyperplasia 05/02/2020   CAD (coronary artery disease) 2006   stent LAD 3.0x18 Cypher, occluded RCA   Cancer (HCC)    multiple myeloma   Cerumen impaction 10/21/2021   Last Assessment & Plan: Formatting of this note might be different from the original.  Right cerumen impaction cleared with irrigation.  Left with yellow appearing discharge,  patient reports he also recently been flushing it at home. Patient denies any pain.  Concern for immunocompromised, chronic otorrhea, will rx ofloxacin drops per formualry   Cervical lymphadenopathy 03/31/2016   Colitis 05/12/2016   Coronary artery disease 07/21/2011   stent LAD 3.0x18 Cypher, occluded RCA  Overview:  Stent 2006 at [facility], Low risk stress test feb 2017  Last Assessment & Plan:  Declined further stress testing in 2015 due to cost and absence of symptoms.  Continues to be asymptomatic.  He has upcoming follow-up with cardiology for review. Encouraged to keep appt  Stent 2006 at [facility], Low risk s   Diabetes mellitus without complication [facility])    DNR (do not resuscitate) discussion    Elevated PSA 01/24/2015   Last Assessment & Plan: Formatting of this note might be different from the original.  Reports he has a pending biopsy which will be rescheduled due to recent DVT.  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  Patient is up-to-date on Covid booster   Infected tooth 03/31/2016   Leukocytosis    Lipoma 07/19/2014   Overview:  Right scalp.  Seen by Vaughan Sine in past  Last Assessment & Plan:  Lipoma on right scalp getting larger, will refer to ENT, but more concerned about lymph node   Lymphadenopathy 11/21/2015   Last Assessment & Plan:  Right submandibular lymph node, non-tender, now larger. CBC 2 months ago showed mild anemia, normal peripheral smear.  Will recheck today along with ESR, sed rate.  Will refer to ENT for further evaluation.    Last Assessment & Plan:   Soft, right-sided postauricular mass, deferred intervention in 2018 at onset of myeloma diagnosis, gradually enlarging over time.  Had cons   Monoclonal gammopathy    Multiple myeloma (HCC) 03/20/2016   Last Assessment & Plan:  Given dx, rec social distancing, avoiding in person jury duty.  Letter provided Formatting of this note might be different from the original. Last Assessment & Plan:  Myeloma treatment is going well, he has upcoming oncology visit   Multiple myeloma in remission (HCC) 07/22/2016   Last Assessment & Plan:  Formatting of this note might be different from the original.  Patient remains in remission, followed by oncology.  Had labs drawn today   Night sweats 01/15/2021   Last Assessment & Plan:   Formatting of this note might be different from the original.    With no signs of infection or new meds.  UTD with oncology as of last week.  Will check labs and CXR as noted.  Advised to let me know if new symptoms or persists, will consider blood and urine cultures.   Palliative care by specialist    Pancreatitis 03/02/2016   Peripheral neuropathy 07/22/2016   Last Assessment & Plan:  Overall improved. Patient has discontinued gabapentin, did not feel much difference  Feet and fingers.  Multifactorial, DM, Chemo, MM  Last Assessment & Plan:   Doing well on gabapentin Formatting of this note might be  different from the original. Last Assessment & Plan:  Overall improved. Patient has discontinued gabapentin, did not feel much difference   Physical deconditioning 06/13/2016   Last Assessment & Plan:  Marked weakness.  Will request further home assistance per home health with bathing.  Discharged on a supplement- patient reports is unaffordable.  Advised prefer food as no barriers, appetite reasonable, ok to use glucerna or carnation instant breakfast if desired.   Pre-operative clearance 01/30/2017   Protein-calorie malnutrition, severe 03/03/2016   Pulmonary embolism (HCC) 09/01/2021   Right hip pain 09/27/2019   Last Assessment & Plan:  Formatting of this note might be different from the original.  Recent tests reassuring for no malignancy.  known hip arthritis.  Symptoms today separate from neuropathy.  He plans on starting program with trainer at [facility], declined PT today for gait and strength.  Will reassess at followup.   Screening for malignant neoplasm of colon 05/02/2020   Sinus tachycardia 05/13/2016  SIRS (systemic inflammatory response syndrome) (HCC) 05/12/2016   Stage 3 chronic kidney disease (HCC) 06/13/2016   Last Assessment & Plan:  Will check Cr/K today  June 2020, annual follow-up [place] [facility]  Last Assessment & Plan:   Recent labs reviewed- stable Formatting of this note might be different from the original. Last Assessment & Plan:  Will check Cr/K today   Thrombocytopenia (HCC) 09/01/2021   Type 2 diabetes mellitus (HCC) 07/21/2011   Last Assessment & Plan:   Diabetes improved with fasting glucoses in a safe range.  Continue current regimen. Formatting of this note might be different from the original. Last Assessment & Plan:  Patient's diabetes has been well controlled at baseline on 12 units of Lantus daily.  He reports some hyperglycemia on the first few days after dexamethasone with resolution by the end of the week. We'll   Unsteady gait 10/21/2021   Last Assessment &  Plan: Formatting of this note might be different from the original.  Will treat cerumen impaction and otorrhea, continue PT.  Likely multifactorial with peripheral neuropathy, BPPV playing a role.  Conitnue PT   Upper abdominal pain    Urinary retention    Vitamin D deficiency 05/02/2020   Weakness generalized    Social History   Socioeconomic History   Marital status: Married    Spouse name: Not on file   Number of children: 6   Years of education: Not on file   Highest education level: Not on file  Occupational History   Occupation: retired Optician, dispensing  Tobacco Use   Smoking status: Never   Smokeless tobacco: Never  Vaping Use   Vaping status: Never Used  Substance and Sexual Activity   Alcohol use: No   Drug use: No   Sexual activity: Not Currently  Other Topics Concern   Not on file  Social History Narrative   Not on file   Social Determinants of Health   Financial Resource Strain: Low Risk  (07/30/2022)   Overall Financial Resource Strain (CARDIA)    Difficulty of Paying Living Expenses: Not hard at all  Food Insecurity: No Food Insecurity (07/30/2022)   Hunger Vital Sign    Worried About Running Out of Food in the Last Year: Never true    Ran Out of Food in the Last Year: Never true  Transportation Needs: No Transportation Needs (07/30/2022)   PRAPARE - Administrator, Civil Service (Medical): No    Lack of Transportation (Non-Medical): No  Physical Activity: Insufficiently Active (07/30/2022)   Exercise Vital Sign    Days of Exercise per Week: 3 days    Minutes of Exercise per Session: 30 min  Stress: No Stress Concern Present (07/30/2022)   [employer] of Occupational Health - Occupational Stress Questionnaire    Feeling of Stress : Not at all  Social Connections: Moderately Integrated (07/30/2022)   Social Connection and Isolation Panel [NHANES]    Frequency of Communication with Friends and Family: More than three times a week    Frequency of  Social Gatherings with Friends and Family: Three times a week    Attends Religious Services: More than 4 times per year    Active Member of Clubs or Organizations: No    Attends Banker Meetings: Never    Marital Status: Married  Catering manager Violence: Not At Risk (07/30/2022)   Humiliation, Afraid, Rape, and Kick questionnaire    Fear of Current or Ex-Partner: No    Emotionally Abused:  No    Physically Abused: No    Sexually Abused: No   Family History  Problem Relation Age of Onset   Arrhythmia Mother    Heart disease Mother    Diabetes Sister    Diabetes Brother    Cancer Brother    Stroke Father    Diabetes Brother    No Known Allergies  Review of Systems  Constitutional: Negative.   HENT: Negative.    Eyes: Negative.   Respiratory:  Negative for shortness of breath.   Cardiovascular:  Negative for chest pain.  Gastrointestinal: Negative.   Genitourinary: Negative.   Musculoskeletal:  Positive for joint pain.  Skin: Negative.   Neurological: Negative.   Endo/Heme/Allergies: Negative.   Psychiatric/Behavioral: Negative.        Objective:     BP (!) 148/78 (BP Location: Left Arm, Patient Position: Sitting, Cuff Size: Large)   Pulse 74   Ht 5\' 8"  (1.727 m)   Wt 178 lb (80.7 kg)   SpO2 100%   BMI 27.06 kg/m  BP Readings from Last 3 Encounters:  09/01/22 (!) 148/78  07/03/22 135/65  06/17/22 (!) 142/72   Wt Readings from Last 3 Encounters:  09/01/22 178 lb (80.7 kg)  07/30/22 182 lb (82.6 kg)  07/03/22 182 lb 12.8 oz (82.9 kg)    Physical Exam Vitals and nursing note reviewed.  Constitutional:      Appearance: Normal appearance.  HENT:     Head: Normocephalic and atraumatic.     Right Ear: External ear normal.     Left Ear: External ear normal.     Nose: Nose normal.     Mouth/Throat:     Mouth: Mucous membranes are moist.     Pharynx: Oropharynx is clear.  Eyes:     Extraocular Movements: Extraocular movements intact.      Conjunctiva/sclera: Conjunctivae normal.     Pupils: Pupils are equal, round, and reactive to light.  Cardiovascular:     Rate and Rhythm: Normal rate and regular rhythm.     Pulses: Normal pulses.     Heart sounds: Normal heart sounds.  Pulmonary:     Effort: Pulmonary effort is normal.     Breath sounds: Normal breath sounds.  Musculoskeletal:     Right hand: Swelling present.     Left hand: Normal.     Cervical back: Normal range of motion and neck supple.     Comments: See photos of right thumb ; slight swelling noted right thumb, bruising noted   Skin:    General: Skin is warm and dry.  Neurological:     General: No focal deficit present.     Mental Status: He is alert and oriented to person, place, and time.  Psychiatric:        Mood and Affect: Mood normal.        Behavior: Behavior normal.        Thought Content: Thought content normal.        Judgment: Judgment normal.      Verbal consent given by patient to have photo included in chart       Assessment & Plan:   Problem List Items Addressed This Visit   None Visit Diagnoses     Thumb pain, right    -  Primary   Relevant Orders   DG Finger Thumb Right   Elevated blood pressure reading in office with diagnosis of hypertension          1.  Thumb pain, right Patient education given on supportive care.  Red flags given for prompt reevaluation - DG Finger Thumb Right; Future  2. Elevated blood pressure reading in office with diagnosis of hypertension Patient encouraged to continue checking blood pressure at home, keeping a written log and having available for all office visits.   I have reviewed the patient's medical history (PMH, PSH, Social History, Family History, Medications, and allergies) , and have been updated if relevant. I spent 20 minutes reviewing chart and  face to face time with patient.    Return if symptoms worsen or fail to improve.    Kasandra Knudsen Mayers, PA-C

## 2022-09-03 ENCOUNTER — Other Ambulatory Visit (HOSPITAL_COMMUNITY): Payer: Self-pay

## 2022-09-04 ENCOUNTER — Ambulatory Visit: Payer: Medicare PPO | Attending: Critical Care Medicine | Admitting: Family Medicine

## 2022-09-04 ENCOUNTER — Encounter: Payer: Self-pay | Admitting: Family Medicine

## 2022-09-04 DIAGNOSIS — N1831 Chronic kidney disease, stage 3a: Secondary | ICD-10-CM

## 2022-09-04 DIAGNOSIS — Z794 Long term (current) use of insulin: Secondary | ICD-10-CM | POA: Diagnosis not present

## 2022-09-04 DIAGNOSIS — Z7984 Long term (current) use of oral hypoglycemic drugs: Secondary | ICD-10-CM

## 2022-09-04 DIAGNOSIS — M79644 Pain in right finger(s): Secondary | ICD-10-CM

## 2022-09-04 DIAGNOSIS — I10 Essential (primary) hypertension: Secondary | ICD-10-CM

## 2022-09-04 DIAGNOSIS — E1122 Type 2 diabetes mellitus with diabetic chronic kidney disease: Secondary | ICD-10-CM | POA: Diagnosis not present

## 2022-09-04 DIAGNOSIS — Z86711 Personal history of pulmonary embolism: Secondary | ICD-10-CM

## 2022-09-04 DIAGNOSIS — I2510 Atherosclerotic heart disease of native coronary artery without angina pectoris: Secondary | ICD-10-CM

## 2022-09-04 NOTE — Progress Notes (Signed)
Virtual Visit via Video Note  I connected with Douglas Pippins., on 09/04/2022 at 10:00 AM by video enabled telemedicine device and verified that I am speaking with the correct person using two identifiers.   Consent: I discussed the limitations, risks, security and privacy concerns of performing an evaluation and management service by telemedicine and the availability of in person appointments. I also discussed with the patient that there may be a patient responsible charge related to this service. The patient expressed understanding and agreed to proceed.   Location of Patient: Home  Location of Provider: Home Office   Persons participating in Telemedicine visit: Rahul Barrett. Dr. Alvis Lemmings     History of Present Illness: Douglas Eckstrom. is a 77 y.o. year old male  patient of Dr Delford Field with a history of CAD (s/p stent placement),, HTN, Type 2 DM, Multiple Myeloma in remission, history of PE.   Discussed the use of AI scribe software for clinical note transcription with the patient, who gave verbal consent to proceed.  He had an accident where he slammed his right thumb in a car door  4 days ago. The thumb is swollen and slightly purple, but there is no pus, pain or signs of infection. The patient reports that the swelling seems to increase when he accidentally taps or jams it. He has not been applying ice or any topical anti-inflammatory. An x-ray was done after he was evaluated at the mobile unit and showed no acute fracture or dislocation, but mild soft tissue swelling was noted. The patient reports no significant pain unless the thumb is accidentally hit or jammed.  The patient's diabetes is being managed with [place] and an Lantus (dose of insulin was increased at his last visit). His blood sugars have been between 90 and 121. His last A1c was 8.3, which is higher than the target of 7.0. His blood pressure has been well controlled, with readings around 121/ar at home . He  is on amlodipine, lisinopril, and metoprolol for hypertension. He also has a history of pulmonary embolism for which he was on Eliquis, but this has been discontinued and he is now on aspirin. He also has a stent due to coronary artery disease and reports no current chest pain or shortness of breath.  He had a visit with his PCP for chronic disease management 2 months ago and he has a follow up with his vascularr doctor tomorrow      Past Medical History:  Diagnosis Date   Arterial insufficiency of lower extremity (HCC) 09/01/2016   Overview:  Mild (2018)  Last Assessment & Plan:  Mild arterial insufficiency, he does not have classic symptoms of claudication. Advised daily foot checks and would consider further evaluation if new or worsening symptoms  Mild (2018)  Last Assessment & Plan:  asymptomatic.  We will follow-up as needed Formatting of this note might be different from the original. Mild (2018)  Last Assessment & Pla   Benign prostatic hyperplasia 05/02/2020   CAD (coronary artery disease) 2006   stent LAD 3.0x18 Cypher, occluded RCA   Cerumen impaction 10/21/2021   Last Assessment & Plan: Formatting of this note might be different from the original.  Right cerumen impaction cleared with irrigation.  Left with yellow appearing discharge,  patient reports he also recently been flushing it at home. Patient denies any pain.  Concern for immunocompromised, chronic otorrhea, will rx ofloxacin drops per formualry   Cervical lymphadenopathy 03/31/2016   Colitis 05/12/2016  Elevated PSA 01/24/2015   Last Assessment & Plan: Formatting of this note might be different from the original.  Reports he has a pending biopsy which will be rescheduled due to recent DVT.  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  Patient is up-to-date on Covid booster   Infected tooth 03/31/2016   Monoclonal gammopathy    Multiple myeloma in remission (HCC) 07/22/2016   Last Assessment & Plan:  Formatting of this note might be different from the original.  Patient remains in remission, followed by oncology.  Had labs drawn today   Peripheral neuropathy 07/22/2016   Last Assessment & Plan:  Overall improved. Patient has discontinued gabapentin, did not feel much difference  Feet and fingers.  Multifactorial, DM, Chemo, MM  Last Assessment & Plan:   Doing well on gabapentin Formatting of this note might be different from the original. Last Assessment & Plan:  Overall improved. Patient has discontinued gabapentin, did not feel much difference   Screening for malignant neoplasm of colon 05/02/2020   Stage 3 chronic kidney disease (HCC) 06/13/2016   Last Assessment & Plan:  Will check Cr/K today  June 2020, annual follow-up [place] kidney  Last Assessment & Plan:   Recent labs reviewed- stable Formatting of this note might be different from the original. Last Assessment & Plan:  Will check Cr/K today   Type 2 diabetes mellitus (HCC) 07/21/2011   Last Assessment & Plan:   Diabetes improved with fasting glucoses in a safe range.  Continue current regimen. Formatting of this note might be different from the original. Last Assessment & Plan:  Patient's diabetes has been well controlled at baseline on 12 units of Lantus daily.  He reports some hyperglycemia on the first few days after dexamethasone with resolution by the end of the week. 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 Patient has discontinued gabapentin, did not feel much difference     Feet and  fingers.  Multifactorial, DM, Chemo, MM     Last Assessment & Plan:    Doing well on gabapentin  Formatting of this note might be different from the original.  Last Assessment & Plan:   Overall improved. Patient has discontinued gabapentin, did not feel much difference     For   Vitamin D deficiency 05/02/2020   No Known Allergies  Current Outpatient Medications on File Prior to Visit  Medication Sig Dispense Refill   amLODipine (NORVASC) 10 MG tablet Take 1 tablet (10 mg total) by mouth daily. 90 tablet 2   amoxicillin (AMOXIL) 250 MG capsule Once before dental procedures 5 capsule 0   aspirin EC 81 MG tablet Take 1 tablet (81 mg total) by mouth daily. Swallow whole. 90 tablet 3   atorvastatin (LIPITOR) 20 MG tablet Take 1 tablet (20 mg total) by mouth daily. 90 tablet 3   Cholecalciferol 10 MCG (400 UNIT) CAPS Take 1 capsule by mouth daily.     dapagliflozin propanediol (FARXIGA) 10 MG TABS tablet Take 1 tablet (10 mg total) by  mouth daily before breakfast. 30 tablet 0   ELIQUIS 5 MG TABS tablet Take 5 mg by mouth 2 (two) times daily.     gabapentin (NEURONTIN) 300 MG capsule Take 1 capsule (300 mg total) by mouth daily. 90 capsule 2   LANTUS SOLOSTAR 100 UNIT/ML Solostar Pen Inject 20 Units into the skin daily. 15 mL 2   lisinopril (ZESTRIL) 2.5 MG tablet Take 1 tablet (2.5 mg total) by mouth daily. 90 tablet 2   metoprolol succinate (TOPROL-XL) 100 MG 24 hr tablet Take 1 tablet (100 mg total) by mouth daily. 90 tablet 2   nitroGLYCERIN (NITROSTAT) 0.4 MG SL tablet Place 1 tablet (0.4 mg total) under the tongue every 5 (five) minutes x 3 doses as needed for chest pain. 30 tablet 12   vitamin B-12 (CYANOCOBALAMIN) 1000 MCG tablet Take 1,000 mcg by mouth daily.     No current facility-administered medications on file prior to visit.    ROS:  See HPI  Observations/Objective: Awake, alert, oriented x3 Not in acute distress Right thumb with slight edema and discoloration but no drainage,  no TTP Normal mood          Latest Ref Rng & Units 06/03/2022   12:14 PM 05/23/2022   11:10 AM 03/18/2022    1:02 PM  CMP  Glucose 70 - 99 mg/dL 130  865  784   BUN 8 - 23 mg/dL 23  21  26    Creatinine 0.61 - 1.24 mg/dL 6.96  2.95  2.84   Sodium 135 - 145 mmol/L 138  138  137   Potassium 3.5 - 5.1 mmol/L 3.9  3.8  4.0   Chloride 98 - 111 mmol/L 105  106  106   CO2 22 - 32 mmol/L 27  26  25    Calcium 8.9 - 10.3 mg/dL 8.6  8.8  8.9   Total Protein 6.5 - 8.1 g/dL 7.1   7.7   Total Bilirubin 0.3 - 1.2 mg/dL 0.4   0.4   Alkaline Phos 38 - 126 U/L 80   73   AST 15 - 41 U/L 16   13   ALT 0 - 44 U/L 20   11     Lipid Panel     Component Value Date/Time   CHOL 137 07/03/2022 1227   TRIG 128 07/03/2022 1227   HDL 54 07/03/2022 1227   CHOLHDL 2.5 07/03/2022 1227   CHOLHDL 4 01/11/2013 1030   VLDL 16.8 01/11/2013 1030   LDLCALC 61 07/03/2022 1227   LABVLDL 22 07/03/2022 1227    Lab Results  Component Value Date   HGBA1C 9.7 (H) 11/13/2012   A1c from 04/2022 was 8.3    Assessment and Plan:     Thumb Trauma Right thumb injury with soft tissue swelling but no acute fracture or dislocation on x-ray. No signs of infection. Pain and swelling persist. -Apply ice to the affected area. -Use topical Voltaren gel for inflammation. -Notify provider if there are any changes such as increased pain, swelling, warmth, or if signs of infection develop.  Diabetes Mellitus Last A1C was 8.3, which is above the target of 7.0. Blood sugars range between 90-121. -Insulin dose was increased at last visit -Continue current insulin regimen and Farxiga. -Check A1C at next visit.  Hypertension Blood pressure well-controlled with readings around 121/70. -Continue current regimen of Amlodipine, Lisinopril, and Metoprolol.  Coronary Artery Disease Stable with no reported chest pain or shortness of breath. History of stent placement  approximately 12 years ago. -Continue current regimen of  Amlodipine, Lisinopril, and Metoprolol.  Follow-up Schedule appointment in three months. Check A1C at next visit.        Follow Up Instructions: 3 months   I discussed the assessment and treatment plan with the patient. The patient was provided an opportunity to ask questions and all were answered. The patient agreed with the plan and demonstrated an understanding of the instructions.   The patient was advised to call back or seek an in-person evaluation if the symptoms worsen or if the condition fails to improve as anticipated.     I provided 22 minutes total of Telehealth time during this encounter including median intraservice time, reviewing previous notes, investigations, ordering medications, medical decision making, coordinating care and patient verbalized understanding at the end of the visit.     Hoy Register, MD, FAAFP. [facility] [place], [place] [phone]   09/04/2022, 10:00 AM

## 2022-09-05 ENCOUNTER — Encounter: Payer: Self-pay | Admitting: Cardiology

## 2022-09-05 ENCOUNTER — Ambulatory Visit: Payer: Medicare PPO | Attending: Cardiology | Admitting: Cardiology

## 2022-09-05 VITALS — BP 138/80 | HR 62 | Resp 18 | Ht 68.0 in | Wt 179.0 lb

## 2022-09-05 DIAGNOSIS — E1140 Type 2 diabetes mellitus with diabetic neuropathy, unspecified: Secondary | ICD-10-CM | POA: Diagnosis not present

## 2022-09-05 DIAGNOSIS — E782 Mixed hyperlipidemia: Secondary | ICD-10-CM

## 2022-09-05 DIAGNOSIS — I2510 Atherosclerotic heart disease of native coronary artery without angina pectoris: Secondary | ICD-10-CM | POA: Diagnosis not present

## 2022-09-05 DIAGNOSIS — I10 Essential (primary) hypertension: Secondary | ICD-10-CM

## 2022-09-05 DIAGNOSIS — Z794 Long term (current) use of insulin: Secondary | ICD-10-CM

## 2022-09-05 NOTE — Patient Instructions (Signed)

## 2022-09-05 NOTE — Progress Notes (Signed)
Cardiology Office Note:    Date:  09/05/2022   ID:  Douglas Rohloff., DOB 23-Jul-1945, MRN [id number]  PCP:  Hoy Register, MD  Cardiologist:  Garwin Brothers, MD   Referring MD: Storm Frisk, MD    ASSESSMENT:    1. Coronary artery disease involving native coronary artery of native heart without angina pectoris   2. Essential hypertension   3. Type 2 diabetes mellitus with diabetic neuropathy, without long-term current use of insulin (HCC)   4. Mixed hyperlipidemia    PLAN:    In order of problems listed above:  Coronary artery disease: Secondary prevention stressed with the patient.  Importance of compliance with diet medication stressed any vocalized understanding.  He was advised to ambulate to the best of his ability. Essential hypertension: Blood pressure stable and diet was emphasized.  Lifestyle modification urged. Mixed dyslipidemia: On lipid-lowering medications.  Lipids reviewed and discussed with him.  Diet emphasized. Diabetes mellitus: Followed by primary care he is trying to do his best to follow-up History of pulmonary embolism on anticoagulation followed by primary care.  Benefits risks of anticoagulation visit. Patient will be seen in follow-up appointment in 6 months or earlier if the patient has any concerns.    Medication Adjustments/Labs and Tests Ordered: Current medicines are reviewed at length with the patient today.  Concerns regarding medicines are outlined above.  Orders Placed This Encounter  Procedures   EKG 12-Lead   No orders of the defined types were placed in this encounter.    Chief Complaint  Patient presents with   Follow-up     History of Present Illness:    Douglas Serven. is a 77 y.o. male.  Patient has past medical history of coronary artery disease, essential hypertension, mixed dyslipidemia and diabetes mellitus with neuropathy.  He denies any problems at this time and takes care of activities of daily living.  No  chest pain orthopnea or PND.  At the time of my evaluation, the patient is alert awake oriented and in no distress.  Past Medical History:  Diagnosis Date   Arterial insufficiency of lower extremity (HCC) 09/01/2016   Overview:  Mild (2018)  Last Assessment & Plan:  Mild arterial insufficiency, he does not have classic symptoms of claudication. Advised daily foot checks and would consider further evaluation if new or worsening symptoms  Mild (2018)  Last Assessment & Plan:  asymptomatic.  We will follow-up as needed Formatting of this note might be different from the original. Mild (2018)  Last Assessment & Pla   Benign prostatic hyperplasia 05/02/2020   CAD (coronary artery disease) 2006   stent LAD 3.0x18 [place], occluded RCA   Cerumen impaction 10/21/2021   Last Assessment & Plan: Formatting of this note might be different from the original.  Right cerumen impaction cleared with irrigation.  Left with yellow appearing discharge,  patient reports he also recently been flushing it at home. Patient denies any pain.  Concern for immunocompromised, chronic otorrhea, will rx ofloxacin drops per formualry   Cervical lymphadenopathy 03/31/2016   Colitis 05/12/2016   Elevated PSA 01/24/2015   Last Assessment & Plan: Formatting of this note might be different from the original.  Reports he has a pending biopsy which will be rescheduled due to recent DVT.  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  Patient is up-to-date on Covid booster   Infected tooth 03/31/2016   Monoclonal gammopathy    Multiple myeloma in  remission (HCC) 07/22/2016   Last Assessment & Plan:  Formatting of this note might be different from the original.  Patient remains in remission, followed by oncology.  Had labs drawn today   Peripheral neuropathy 07/22/2016   Last Assessment & Plan:  Overall improved. Patient has discontinued gabapentin, did not feel much difference  Feet and fingers.  Multifactorial, DM, Chemo, MM  Last Assessment & Plan:   Doing well on gabapentin Formatting of this note might be different from the original. Last Assessment & Plan:  Overall improved. Patient has discontinued gabapentin, did not feel much difference   Screening for malignant neoplasm of colon 05/02/2020   Stage 3 chronic kidney disease (HCC) 06/13/2016   Last Assessment & Plan:  Will check Cr/K today  June 2020, annual follow-up [place] [facility]  Last Assessment & Plan:   Recent labs reviewed- stable Formatting of this note might be different from the original. Last Assessment & Plan:  Will check Cr/K today   Type 2 diabetes mellitus (HCC) 07/21/2011   Last Assessment & Plan:   Diabetes improved with fasting glucoses in a safe range.  Continue current regimen. Formatting of this note might be different from the original. Last Assessment & Plan:  Patient's diabetes has been well controlled at baseline on 12 units of Lantus daily.  He reports some hyperglycemia on the first few days after dexamethasone with resolution by the end of the week. 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 Patient has discontinued gabapentin, did not feel much difference     Feet and fingers.  Multifactorial, DM, Chemo, MM     Last Assessment & Plan:    Doing well on gabapentin  Formatting of this note might be different from the original.  Last Assessment & Plan:   Overall improved. Patient has discontinued gabapentin, did not feel much difference     For   Vitamin D deficiency 05/02/2020     Past Surgical History:  Procedure Laterality Date   CARDIAC CATHETERIZATION  2006   CORONARY ANGIOPLASTY WITH STENT PLACEMENT  2007   IR FLUORO GUIDE CV LINE RIGHT  05/20/2016   IR US GUIDE VASC ACCESS RIGHT  05/20/2016    Current Medications: No outpatient medications have been marked as taking for the 09/05/22 encounter (Office Visit) with Cambelle Suchecki, Aundra Dubin, MD.     Allergies:   Patient has no known allergies.   Social History   Socioeconomic History   Marital status: Married    Spouse name: Not on file   Number of children: 6   Years of education: Not on file   Highest education level: Not on file  Occupational History   Occupation: retired Optician, dispensing  Tobacco Use   Smoking status: Never   Smokeless tobacco: Never  Vaping Use   Vaping status: Never Used  Substance and Sexual Activity   Alcohol use: No   Drug use: No   Sexual activity: Not Currently  Other Topics Concern   Not on file  Social History Narrative   Not on file  Social Determinants of Health   Financial Resource Strain: Low Risk  (07/30/2022)   Overall Financial Resource Strain (CARDIA)    Difficulty of Paying Living Expenses: Not hard at all  Food Insecurity: No Food Insecurity (07/30/2022)   Hunger Vital Sign    Worried About Running Out of Food in the Last Year: Never true    Ran Out of Food in the Last Year: Never true  Transportation Needs: No Transportation Needs (07/30/2022)   PRAPARE - Administrator, Civil Service (Medical): No    Lack of Transportation (Non-Medical): No  Physical Activity: Insufficiently Active (07/30/2022)   Exercise Vital Sign    Days of Exercise per Week: 3 days    Minutes of Exercise per Session: 30 min  Stress: No Stress Concern Present (07/30/2022)   [employer] of Occupational Health - Occupational Stress Questionnaire    Feeling of Stress : Not at all  Social Connections: Moderately Integrated (07/30/2022)   Social Connection and Isolation Panel  [NHANES]    Frequency of Communication with Friends and Family: More than three times a week    Frequency of Social Gatherings with Friends and Family: Three times a week    Attends Religious Services: More than 4 times per year    Active Member of Clubs or Organizations: No    Attends Banker Meetings: Never    Marital Status: Married     Family History: The patient's family history includes Arrhythmia in his mother; Cancer in his brother; Diabetes in his brother, brother, and sister; Heart disease in his mother; Stroke in his father.  ROS:   Please see the history of present illness.    All other systems reviewed and are negative.  EKGs/Labs/Other Studies Reviewed:    The following studies were reviewed today: .[name] KitchenEKG Interpretation Date/Time:  Friday September 05 2022 11:10:32 EDT Ventricular Rate:  62 PR Interval:  198 QRS Duration:  78 QT Interval:  412 QTC Calculation: 418 R Axis:   0  Text Interpretation: Normal sinus rhythm Possible Left atrial enlargement When compared with ECG of 31-Aug-2021 22:03, QRS axis Shifted left Confirmed by Belva Crome [phone]) on 09/05/2022 11:22:20 AM     Recent Labs: 06/03/2022: ALT 20; BUN 23; Creatinine 1.69; Hemoglobin 11.9; Platelet Count 184; Potassium 3.9; Sodium 138  Recent Lipid Panel    Component Value Date/Time   CHOL 137 07/03/2022 1227   TRIG 128 07/03/2022 1227   HDL 54 07/03/2022 1227   CHOLHDL 2.5 07/03/2022 1227   CHOLHDL 4 01/11/2013 1030   VLDL 16.8 01/11/2013 1030   LDLCALC 61 07/03/2022 1227    Physical Exam:    VS:  BP 138/80 (BP Location: Right Arm, Patient Position: Sitting, Cuff Size: Normal)   Pulse 62   Resp 18   Ht 5\' 8"  (1.727 m)   Wt 179 lb (81.2 kg)   SpO2 99%   BMI 27.22 kg/m     Wt Readings from Last 3 Encounters:  09/05/22 179 lb (81.2 kg)  09/01/22 178 lb (80.7 kg)  07/30/22 182 lb (82.6 kg)     GEN: Patient is in no acute distress HEENT: Normal NECK: No JVD; No carotid  bruits LYMPHATICS: No lymphadenopathy CARDIAC: Hear sounds regular, 2/6 systolic murmur at the apex. RESPIRATORY:  Clear to auscultation without rales, wheezing or rhonchi  ABDOMEN: Soft, non-tender, non-distended MUSCULOSKELETAL:  No edema; No deformity  SKIN: Warm and dry NEUROLOGIC:  Alert and oriented x 3 PSYCHIATRIC:  Normal affect  Signed, Garwin Brothers, MD  09/05/2022 3:10 PM    [facility]

## 2022-09-15 ENCOUNTER — Other Ambulatory Visit: Payer: Self-pay

## 2022-09-15 DIAGNOSIS — C9001 Multiple myeloma in remission: Secondary | ICD-10-CM

## 2022-09-16 ENCOUNTER — Inpatient Hospital Stay: Payer: Medicare PPO | Attending: Hematology

## 2022-09-16 DIAGNOSIS — R7989 Other specified abnormal findings of blood chemistry: Secondary | ICD-10-CM | POA: Diagnosis not present

## 2022-09-16 DIAGNOSIS — E1165 Type 2 diabetes mellitus with hyperglycemia: Secondary | ICD-10-CM | POA: Insufficient documentation

## 2022-09-16 DIAGNOSIS — Z79899 Other long term (current) drug therapy: Secondary | ICD-10-CM | POA: Diagnosis not present

## 2022-09-16 DIAGNOSIS — C9001 Multiple myeloma in remission: Secondary | ICD-10-CM | POA: Insufficient documentation

## 2022-09-16 DIAGNOSIS — Z86711 Personal history of pulmonary embolism: Secondary | ICD-10-CM | POA: Insufficient documentation

## 2022-09-16 DIAGNOSIS — D649 Anemia, unspecified: Secondary | ICD-10-CM | POA: Insufficient documentation

## 2022-09-16 LAB — CBC WITH DIFFERENTIAL (CANCER CENTER ONLY)
Abs Immature Granulocytes: 0 10*3/uL (ref 0.00–0.07)
Basophils Absolute: 0 10*3/uL (ref 0.0–0.1)
Basophils Relative: 0 %
Eosinophils Absolute: 0.1 10*3/uL (ref 0.0–0.5)
Eosinophils Relative: 2 %
HCT: 40.7 % (ref 39.0–52.0)
Hemoglobin: 13.2 g/dL (ref 13.0–17.0)
Immature Granulocytes: 0 %
Lymphocytes Relative: 28 %
Lymphs Abs: 1.1 10*3/uL (ref 0.7–4.0)
MCH: 28.6 pg (ref 26.0–34.0)
MCHC: 32.4 g/dL (ref 30.0–36.0)
MCV: 88.3 fL (ref 80.0–100.0)
Monocytes Absolute: 0.5 10*3/uL (ref 0.1–1.0)
Monocytes Relative: 14 %
Neutro Abs: 2.1 10*3/uL (ref 1.7–7.7)
Neutrophils Relative %: 56 %
Platelet Count: 196 10*3/uL (ref 150–400)
RBC: 4.61 MIL/uL (ref 4.22–5.81)
RDW: 16 % — ABNORMAL HIGH (ref 11.5–15.5)
WBC Count: 3.8 10*3/uL — ABNORMAL LOW (ref 4.0–10.5)
nRBC: 0 % (ref 0.0–0.2)

## 2022-09-16 LAB — CMP (CANCER CENTER ONLY)
ALT: 21 U/L (ref 0–44)
AST: 14 U/L — ABNORMAL LOW (ref 15–41)
Albumin: 3.6 g/dL (ref 3.5–5.0)
Alkaline Phosphatase: 67 U/L (ref 38–126)
Anion gap: 4 — ABNORMAL LOW (ref 5–15)
BUN: 19 mg/dL (ref 8–23)
CO2: 29 mmol/L (ref 22–32)
Calcium: 8.8 mg/dL — ABNORMAL LOW (ref 8.9–10.3)
Chloride: 105 mmol/L (ref 98–111)
Creatinine: 1.64 mg/dL — ABNORMAL HIGH (ref 0.61–1.24)
GFR, Estimated: 43 mL/min — ABNORMAL LOW (ref >60)
Glucose, Bld: 134 mg/dL — ABNORMAL HIGH (ref 70–99)
Potassium: 3.9 mmol/L (ref 3.5–5.1)
Sodium: 138 mmol/L (ref 135–145)
Total Bilirubin: 0.5 mg/dL (ref 0.3–1.2)
Total Protein: 7.4 g/dL (ref 6.5–8.1)

## 2022-09-18 LAB — KAPPA/LAMBDA LIGHT CHAINS
Kappa free light chain: 39.4 mg/L — ABNORMAL HIGH (ref 3.3–19.4)
Kappa, lambda light chain ratio: 1.79 — ABNORMAL HIGH (ref 0.26–1.65)
Lambda free light chains: 22 mg/L (ref 5.7–26.3)

## 2022-09-19 LAB — MULTIPLE MYELOMA PANEL, SERUM
Albumin SerPl Elph-Mcnc: 3.1 g/dL (ref 2.9–4.4)
Albumin/Glob SerPl: 1 (ref 0.7–1.7)
Alpha 1: 0.2 g/dL (ref 0.0–0.4)
Alpha2 Glob SerPl Elph-Mcnc: 0.7 g/dL (ref 0.4–1.0)
B-Globulin SerPl Elph-Mcnc: 0.9 g/dL (ref 0.7–1.3)
Gamma Glob SerPl Elph-Mcnc: 1.6 g/dL (ref 0.4–1.8)
Globulin, Total: 3.4 g/dL (ref 2.2–3.9)
IgA: 351 mg/dL (ref 61–437)
IgG (Immunoglobin G), Serum: 1704 mg/dL — ABNORMAL HIGH (ref 603–1613)
IgM (Immunoglobulin M), Srm: 51 mg/dL (ref 15–143)
Total Protein ELP: 6.5 g/dL (ref 6.0–8.5)

## 2022-09-23 ENCOUNTER — Inpatient Hospital Stay: Payer: Medicare PPO | Admitting: Hematology

## 2022-09-23 VITALS — BP 131/81 | HR 68 | Temp 97.5°F | Resp 20 | Wt 180.5 lb

## 2022-09-23 DIAGNOSIS — C9001 Multiple myeloma in remission: Secondary | ICD-10-CM | POA: Diagnosis not present

## 2022-09-23 NOTE — Progress Notes (Signed)
[name]  HEMATOLOGY ONCOLOGY CLINIC NOTE  Date of service: 09/23/22   Patient Care Team: Hoy Register, MD as PCP - General (Family Medicine) Johney Maine, MD as Consulting Physician (Hematology) Illene Labrador, OD (Optometry)  Chief Complaint : F/u for continued evaluation management of multiple myeloma  Diagnosis:  IgG Kappa multiple myeloma   Current Treatment: Surveillance   INTERVAL HISTORY:  Douglas Delpozo. is here for continued evaluation and management of his multiple myeloma.   Patient was last seen by me on 06/17/2022 and he complained of persistent bilateral leg neuropathy, but was doing well overall.   Patient notes he has been doing well overall without any new or severe medical concerns since our last visit. He denies any new infection issues, fever, chills, night sweats, unexpected weight loss, chest pain, abdominal pain, or leg swelling.   Patient notes he regularly follows up with his Urologist, Dr. Annabell Howells, and he is scheduled to have biopsy next week due to elevated PSA.   REVIEW OF SYSTEMS:   10 Point review of Systems was done is negative except as noted above.   Past Medical History:  Diagnosis Date   Arterial insufficiency of lower extremity (HCC) 09/01/2016   Overview:  Mild (2018)  Last Assessment & Plan:  Mild arterial insufficiency, he does not have classic symptoms of claudication. Advised daily foot checks and would consider further evaluation if new or worsening symptoms  Mild (2018)  Last Assessment & Plan:  asymptomatic.  We will follow-up as needed Formatting of this note might be different from the original. Mild (2018)  Last Assessment & Pla   Benign prostatic hyperplasia 05/02/2020   CAD (coronary artery disease) 2006   stent LAD 3.0x18 Cypher, occluded RCA   Cerumen impaction 10/21/2021   Last Assessment & Plan: Formatting of this note might be different from the original.  Right cerumen impaction cleared with irrigation.  Left with  yellow appearing discharge,  patient reports he also recently been flushing it at home. Patient denies any pain.  Concern for immunocompromised, chronic otorrhea, will rx ofloxacin drops per formualry   Cervical lymphadenopathy 03/31/2016   Colitis 05/12/2016   Elevated PSA 01/24/2015   Last Assessment & Plan: Formatting of this note might be different from the original.  Reports he has a pending biopsy which will be rescheduled due to recent DVT.  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  Patient is up-to-date on Covid booster   Infected tooth 03/31/2016   Monoclonal gammopathy    Multiple myeloma in remission (HCC) 07/22/2016   Last Assessment & Plan:  Formatting of this note might be different from the original.  Patient remains in remission, followed by oncology.  Had labs drawn today   Peripheral neuropathy 07/22/2016   Last Assessment & Plan:  Overall improved. Patient has discontinued gabapentin, did not feel much difference  Feet and fingers.  Multifactorial, DM, Chemo, MM  Last Assessment & Plan:   Doing well on gabapentin Formatting of this note might be different from the original. Last Assessment & Plan:  Overall improved. Patient has discontinued gabapentin, did not  feel much difference   Screening for malignant neoplasm of colon 05/02/2020   Stage 3 chronic kidney disease (HCC) 06/13/2016   Last Assessment & Plan:  Will check Cr/K today  June 2020, annual follow-up [place] [facility]  Last Assessment & Plan:   Recent labs reviewed- stable Formatting of this note might be different from the original.  Last Assessment & Plan:  Will check Cr/K today   Type 2 diabetes mellitus (HCC) 07/21/2011   Last Assessment & Plan:   Diabetes improved with fasting glucoses in a safe range.  Continue current regimen. Formatting of this note might be different from the original. Last Assessment & Plan:  Patient's diabetes has been well controlled at baseline on 12 units of Lantus daily.  He reports some hyperglycemia on the first few days after dexamethasone with resolution by the end of the week. 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 Patient has discontinued gabapentin, did not feel much difference     Feet and fingers.  Multifactorial, DM, Chemo, MM     Last Assessment & Plan:    Doing well on gabapentin  Formatting of this note might be different from the original.  Last Assessment & Plan:   Overall improved. Patient has discontinued gabapentin, did not feel much difference     For   Vitamin D deficiency 05/02/2020    . Past Surgical History:  Procedure Laterality Date   CARDIAC CATHETERIZATION  2006   CORONARY ANGIOPLASTY WITH STENT PLACEMENT  2007   IR FLUORO GUIDE CV LINE RIGHT  05/20/2016   IR US GUIDE VASC ACCESS RIGHT  05/20/2016    . Social History   Tobacco Use   Smoking status: Never   Smokeless tobacco: Never  Vaping Use   Vaping status: Never Used  Substance Use Topics   Alcohol use: No   Drug use: No    ALLERGIES:  has No Known Allergies.  MEDICATIONS:  Current Outpatient Medications  Medication Sig Dispense Refill   amLODipine (NORVASC) 10 MG tablet Take 1 tablet (10 mg total) by mouth daily. 90 tablet 2   amoxicillin (AMOXIL) 250 MG capsule Once before dental procedures 5 capsule 0   aspirin EC 81 MG tablet Take 1 tablet (81 mg total) by mouth daily. Swallow whole. 90 tablet 3   atorvastatin (LIPITOR) 20 MG tablet Take 1 tablet (20 mg total) by mouth daily. 90 tablet 3   Cholecalciferol 10  MCG (400 UNIT) CAPS Take 1 capsule by mouth daily.     dapagliflozin propanediol (FARXIGA) 10 MG TABS tablet Take 1 tablet (10 mg total) by mouth daily before breakfast. 30 tablet 0   ELIQUIS 5 MG TABS tablet Take 5 mg by mouth 2 (two) times daily.     gabapentin (NEURONTIN) 300 MG capsule Take 1 capsule (300 mg total) by mouth daily. 90 capsule 2   LANTUS SOLOSTAR 100 UNIT/ML Solostar Pen Inject 20 Units into the skin daily. 15 mL 2   lisinopril (ZESTRIL) 2.5 MG tablet Take 1 tablet (2.5 mg total) by mouth daily. 90 tablet 2   metoprolol succinate (TOPROL-XL) 100 MG 24 hr tablet Take 1 tablet (100 mg total) by mouth daily. 90 tablet 2   nitroGLYCERIN (NITROSTAT) 0.4 MG SL tablet Place 1 tablet (0.4 mg total) under the tongue every 5 (five) minutes x 3 doses as needed for chest pain. 30 tablet 12   vitamin B-12 (  CYANOCOBALAMIN) 1000 MCG tablet Take 1,000 mcg by mouth daily.     No current facility-administered medications for this visit.    PHYSICAL EXAMINATION: .BP 131/81   Pulse 68   Temp (!) 97.5 F (36.4 C)   Resp 20   Wt 180 lb 8 oz (81.9 kg)   SpO2 100%   BMI 27.44 kg/m   GENERAL:alert, in no acute distress and comfortable SKIN: no acute rashes, no significant lesions EYES: conjunctiva are pink and non-injected, sclera anicteric OROPHARYNX: MMM, no exudates, no oropharyngeal erythema or ulceration NECK: supple, no JVD LYMPH:  no palpable lymphadenopathy in the cervical, axillary or inguinal regions LUNGS: clear to auscultation b/l with normal respiratory effort HEART: regular rate & rhythm ABDOMEN:  normoactive bowel sounds , non tender, not distended. Extremity: no pedal edema PSYCH: alert & oriented x 3 with fluent speech NEURO: no focal motor/sensory deficits   LABORATORY DATA:   I have reviewed the data as listed     Latest Ref Rng & Units 09/16/2022    3:11 PM 06/03/2022   12:14 PM 05/23/2022   11:10 AM  CBC  WBC 4.0 - 10.5 K/uL 3.8  3.9  4.0   Hemoglobin 13.0  - 17.0 g/dL 16.1  09.6  04.5   Hematocrit 39.0 - 52.0 % 40.7  36.2  38.3   Platelets 150 - 400 K/uL 196  184  178        Latest Ref Rng & Units 09/16/2022    3:11 PM 06/03/2022   12:14 PM 05/23/2022   11:10 AM  CMP  Glucose 70 - 99 mg/dL 409  811  914   BUN 8 - 23 mg/dL 19  23  21    Creatinine 0.61 - 1.24 mg/dL 7.82  9.56  2.13   Sodium 135 - 145 mmol/L 138  138  138   Potassium 3.5 - 5.1 mmol/L 3.9  3.9  3.8   Chloride 98 - 111 mmol/L 105  105  106   CO2 22 - 32 mmol/L 29  27  26    Calcium 8.9 - 10.3 mg/dL 8.8  8.6  8.8   Total Protein 6.5 - 8.1 g/dL 7.4  7.1    Total Bilirubin 0.3 - 1.2 mg/dL 0.5  0.4    Alkaline Phos 38 - 126 U/L 67  80    AST 15 - 41 U/L 14  16    ALT 0 - 44 U/L 21  20         RADIOGRAPHIC STUDIES: I have personally reviewed the radiological images as listed and agreed with the findings in the report. DG Finger Thumb Right  Result Date: 09/03/2022 CLINICAL DATA:  Right thumb injury. Slammed in car door yesterday. Swelling and bruising under the nail. Whole thumb is painful and hard to bend. EXAM: RIGHT THUMB 2+V COMPARISON:  None Available. FINDINGS: Minimal thumb carpometacarpal joint space narrowing, subchondral sclerosis, and peripheral osteophytosis. Minimal thumb interphalangeal joint space narrowing. No acute fracture or dislocation. Mild distal thumb soft tissue swelling. IMPRESSION: 1. No acute fracture or dislocation. 2. Mild distal thumb soft tissue swelling. Electronically Signed   By: Neita Garnet M.D.   On: 09/03/2022 16:42    ASSESSMENT & PLAN:   77 y.o.  with multiple medical comorbidities but fairly good performance status overall with   1) ISS Stage II IgG [place] multiple myeloma. Anemia + Renal insufficiency.  Bone survey neg for concerning bone lesions. significantly elevated kappa free light chains 858, lambda  18.4, with a ratio of 46.65 . He was also noted to have an M spike of 1.7 g/dL with IFE showing IgG kappa monoclonal protein.  Quantitative IgG level was increased to nearly 2500 mg/dL. 24-hour UPEP showed monoclonal protein of 1300 mg per 24 hours which constituted about 86% of all the urinary protein. Bone marrow biopsy showed 20-30% Restricted plasma cells consistent with multiple myeloma Cytogenetics/FISH  Showed +4, +11, and +17   Patient has been treated with CyBord X 2 cycles . Treatment interrupted due to severe c diff colitis with prolonged hospitalization and decline in performance status.  Patient is much improved and back to baseline now. M protein undetected with neg IFE Has been treated with 5 cycles of Rd  Had been on maintenance Revlimid for more than 2 years until PEs in August 2023.  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 Had an element of obstructive uropathy due to BPH which has resolved. Creatinine continues to be stable.   3) Normocytic anemia likely related to multiple myeloma.-stable. No abdominal pain or discomfort over the right upper quadrant. No fevers or chills.  #4 right lower and middle lobe pulmonary embolism August 2023-Revlimid possible provoking factor. currently off Revlimid. 4) DM2 - controlled -continue management per PC   5) Concern for skin cancer lesion on forehead  PLAN:  -Discussed lab results from 09/16/2022 in detail with the patient. CBC is stable. CMP shows elevated glucose level at 134 and elevated creatinine level at 1.64. -Discussed multiple myeloma panel results from 09/16/2022 with the patient. M-Protein not observed.   -Answered all of patient's questions.  -Patient has no obvious lab or clinical evidence of myeloma progression at this time.   -Continue to follow-up with Urologist.   FOLLOW-UP: RTC with Dr Candise Che in 3 months with labs 7-10 days prior to clinic visit  The total time spent in the appointment was 20 minutes* .  All of the patient's questions were answered with apparent satisfaction. The patient  knows to call the clinic with any problems, questions or concerns.   Douglas Lora MD MS AAHIVMS [facility] Hematology/Oncology Physician [facility]  .*Total Encounter Time as defined by the Centers for Medicare and Medicaid Services includes, in addition to the face-to-face time of a patient visit (documented in the note above) non-face-to-face time: obtaining and reviewing outside history, ordering and reviewing medications, tests or procedures, care coordination (communications with other health care professionals or caregivers) and documentation in the medical record.   I,Douglas Rose,acting as a Neurosurgeon for Douglas Lora, MD.,have documented all relevant documentation on the behalf of Douglas Lora, MD,as directed by  Douglas Lora, MD while in the presence of Douglas Lora, MD.  .I have reviewed the above documentation for accuracy and completeness, and I agree with the above. Johney Maine MD

## 2022-10-08 DIAGNOSIS — R972 Elevated prostate specific antigen [PSA]: Secondary | ICD-10-CM | POA: Diagnosis not present

## 2022-10-08 DIAGNOSIS — D075 Carcinoma in situ of prostate: Secondary | ICD-10-CM | POA: Diagnosis not present

## 2022-10-08 DIAGNOSIS — C61 Malignant neoplasm of prostate: Secondary | ICD-10-CM | POA: Diagnosis not present

## 2022-10-13 DIAGNOSIS — E119 Type 2 diabetes mellitus without complications: Secondary | ICD-10-CM | POA: Diagnosis not present

## 2022-10-13 DIAGNOSIS — Z794 Long term (current) use of insulin: Secondary | ICD-10-CM | POA: Diagnosis not present

## 2022-10-15 DIAGNOSIS — Z794 Long term (current) use of insulin: Secondary | ICD-10-CM | POA: Diagnosis not present

## 2022-10-15 DIAGNOSIS — E119 Type 2 diabetes mellitus without complications: Secondary | ICD-10-CM | POA: Diagnosis not present

## 2022-10-21 DIAGNOSIS — N2581 Secondary hyperparathyroidism of renal origin: Secondary | ICD-10-CM | POA: Diagnosis not present

## 2022-10-21 DIAGNOSIS — I739 Peripheral vascular disease, unspecified: Secondary | ICD-10-CM | POA: Diagnosis not present

## 2022-10-21 DIAGNOSIS — N189 Chronic kidney disease, unspecified: Secondary | ICD-10-CM | POA: Diagnosis not present

## 2022-10-21 DIAGNOSIS — E1122 Type 2 diabetes mellitus with diabetic chronic kidney disease: Secondary | ICD-10-CM | POA: Diagnosis not present

## 2022-10-21 DIAGNOSIS — D631 Anemia in chronic kidney disease: Secondary | ICD-10-CM | POA: Diagnosis not present

## 2022-10-21 DIAGNOSIS — C61 Malignant neoplasm of prostate: Secondary | ICD-10-CM | POA: Diagnosis not present

## 2022-10-21 DIAGNOSIS — N183 Chronic kidney disease, stage 3 unspecified: Secondary | ICD-10-CM | POA: Diagnosis not present

## 2022-10-21 DIAGNOSIS — E785 Hyperlipidemia, unspecified: Secondary | ICD-10-CM | POA: Diagnosis not present

## 2022-10-21 DIAGNOSIS — I129 Hypertensive chronic kidney disease with stage 1 through stage 4 chronic kidney disease, or unspecified chronic kidney disease: Secondary | ICD-10-CM | POA: Diagnosis not present

## 2022-10-21 DIAGNOSIS — E559 Vitamin D deficiency, unspecified: Secondary | ICD-10-CM | POA: Diagnosis not present

## 2022-10-29 ENCOUNTER — Other Ambulatory Visit (HOSPITAL_COMMUNITY): Payer: Self-pay | Admitting: Urology

## 2022-10-29 DIAGNOSIS — C61 Malignant neoplasm of prostate: Secondary | ICD-10-CM

## 2022-11-12 DIAGNOSIS — E119 Type 2 diabetes mellitus without complications: Secondary | ICD-10-CM | POA: Diagnosis not present

## 2022-11-12 DIAGNOSIS — Z794 Long term (current) use of insulin: Secondary | ICD-10-CM | POA: Diagnosis not present

## 2022-11-14 DIAGNOSIS — E119 Type 2 diabetes mellitus without complications: Secondary | ICD-10-CM | POA: Diagnosis not present

## 2022-11-14 DIAGNOSIS — Z794 Long term (current) use of insulin: Secondary | ICD-10-CM | POA: Diagnosis not present

## 2022-11-17 ENCOUNTER — Encounter (HOSPITAL_COMMUNITY)
Admission: RE | Admit: 2022-11-17 | Discharge: 2022-11-17 | Disposition: A | Discharge status: Home / Self Care | Payer: Medicare PPO | Source: Ambulatory Visit | Attending: Urology | Admitting: Urology

## 2022-11-17 DIAGNOSIS — C61 Malignant neoplasm of prostate: Secondary | ICD-10-CM | POA: Diagnosis not present

## 2022-11-17 MED ORDER — FLOTUFOLASTAT F 18 GALLIUM 296-5846 MBQ/ML IV SOLN
8.5100 | Freq: Once | INTRAVENOUS | Status: AC
  Administered 2022-11-17: 8.51 via INTRAVENOUS

## 2022-11-25 ENCOUNTER — Ambulatory Visit: Payer: Medicare PPO | Attending: Family Medicine | Admitting: Family Medicine

## 2022-11-25 ENCOUNTER — Encounter: Payer: Self-pay | Admitting: Family Medicine

## 2022-11-25 VITALS — BP 131/75 | HR 61 | Ht 68.0 in | Wt 176.8 lb

## 2022-11-25 DIAGNOSIS — E1122 Type 2 diabetes mellitus with diabetic chronic kidney disease: Secondary | ICD-10-CM

## 2022-11-25 DIAGNOSIS — I129 Hypertensive chronic kidney disease with stage 1 through stage 4 chronic kidney disease, or unspecified chronic kidney disease: Secondary | ICD-10-CM | POA: Diagnosis not present

## 2022-11-25 DIAGNOSIS — N183 Chronic kidney disease, stage 3 unspecified: Secondary | ICD-10-CM | POA: Diagnosis not present

## 2022-11-25 DIAGNOSIS — Z794 Long term (current) use of insulin: Secondary | ICD-10-CM

## 2022-11-25 DIAGNOSIS — N1831 Chronic kidney disease, stage 3a: Secondary | ICD-10-CM

## 2022-11-25 DIAGNOSIS — Z23 Encounter for immunization: Secondary | ICD-10-CM

## 2022-11-25 DIAGNOSIS — C9001 Multiple myeloma in remission: Secondary | ICD-10-CM

## 2022-11-25 DIAGNOSIS — I2510 Atherosclerotic heart disease of native coronary artery without angina pectoris: Secondary | ICD-10-CM | POA: Diagnosis not present

## 2022-11-25 LAB — POCT GLYCOSYLATED HEMOGLOBIN (HGB A1C): HbA1c, POC (controlled diabetic range): 6.7 % (ref 0.0–7.0)

## 2022-11-25 MED ORDER — LANTUS SOLOSTAR 100 UNIT/ML ~~LOC~~ SOPN: 20.0000 [IU] | PEN_INJECTOR | Freq: Every day | SUBCUTANEOUS | 6 refills | Status: DC

## 2022-11-25 MED ORDER — GABAPENTIN 300 MG PO CAPS: 300.0000 mg | ORAL_CAPSULE | Freq: Every day | ORAL | 1 refills | Status: DC

## 2022-11-25 MED ORDER — DAPAGLIFLOZIN PROPANEDIOL 10 MG PO TABS: 10.0000 mg | ORAL_TABLET | Freq: Every day | ORAL | 1 refills | Status: AC

## 2022-11-25 NOTE — Progress Notes (Signed)
Subjective:  Patient ID: Douglas Pippins., male    DOB: July 31, 1945  Age: 77 y.o. MRN: [id number]  CC: Medical Management of Chronic Issues   HPI Douglas Wender. is a 77 y.o. year old male with a history of CAD (s/p stent placement), HTN, Type 2 DM (A1c 6.7), Multiple Myeloma in remission, history of PE (completed anticoagulation with Eliquis).   Interval History: Discussed the use of AI scribe software for clinical note transcription with the patient, who gave verbal consent to proceed.  He presents for a routine follow-up. He has been monitoring his blood sugars at home, noting that his evening readings are slightly elevated, but morning readings are within normal range (71-99). He reports no episodes of hypoglycemia. His most recent A1c is 6.7, indicating good glycemic control. He is also enrolled in a diabetes management program, which includes dietary counseling and regular blood sugar monitoring.  He is under the care of a cardiologist for his CAD and endorses adherence with his statin.  Last visit was in 08/2022.  He reports taking gabapentin for neuropathy and is under the care of a hematologist for his multiple myeloma. He has not yet received his flu shot for the season.   He is concerned about when he is right thumb nail to which he sustained trauma will grow back.    Past Medical History:  Diagnosis Date   Arterial insufficiency of lower extremity (HCC) 09/01/2016   Overview:  Mild (2018)  Last Assessment & Plan:  Mild arterial insufficiency, he does not have classic symptoms of claudication. Advised daily foot checks and would consider further evaluation if new or worsening symptoms  Mild (2018)  Last Assessment & Plan:  asymptomatic.  We will follow-up as needed Formatting of this note might be different from the original. Mild (2018)  Last Assessment & Pla   Benign prostatic hyperplasia 05/02/2020   CAD (coronary artery disease) 2006   stent LAD 3.0x18 [place], occluded  RCA   Cerumen impaction 10/21/2021   Last Assessment & Plan: Formatting of this note might be different from the original.  Right cerumen impaction cleared with irrigation.  Left with yellow appearing discharge,  patient reports he also recently been flushing it at home. Patient denies any pain.  Concern for immunocompromised, chronic otorrhea, will rx ofloxacin drops per formualry   Cervical lymphadenopathy 03/31/2016   Colitis 05/12/2016   Elevated PSA 01/24/2015   Last Assessment & Plan: Formatting of this note might be different from the original.  Reports he has a pending biopsy which will be rescheduled due to recent DVT.  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  Patient is up-to-date on Covid booster   Infected tooth 03/31/2016   Monoclonal gammopathy    Multiple myeloma in remission (HCC) 07/22/2016   Last Assessment & Plan:  Formatting of this note might be different from the original.  Patient remains in remission, followed by oncology.  Had labs drawn today   Peripheral neuropathy 07/22/2016   Last Assessment & Plan:  Overall improved. Patient has discontinued gabapentin, did not feel much difference  Feet and  fingers.  Multifactorial, DM, Chemo, MM  Last Assessment & Plan:   Doing well on gabapentin Formatting of this note might be different from the original. Last Assessment & Plan:  Overall improved. Patient has discontinued gabapentin, did not feel much difference   Screening for malignant neoplasm of colon 05/02/2020   Stage 3 chronic kidney disease (HCC) 06/13/2016   Last Assessment & Plan:   Will check Cr/K today  June 2020, annual follow-up [place] [facility]  Last Assessment & Plan:   Recent labs reviewed- stable Formatting of this note might be different from the original. Last Assessment & Plan:  Will check Cr/K today   Type 2 diabetes mellitus (HCC) 07/21/2011   Last Assessment & Plan:   Diabetes improved with fasting glucoses in a safe range.  Continue current regimen. Formatting of this note might be different from the original. Last Assessment & Plan:  Patient's diabetes has been well controlled at baseline on 12 units of Lantus daily.  He reports some hyperglycemia on the first few days after dexamethasone with resolution by the end of the week. 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 Patient has discontinued gabapentin, did not feel much difference     Feet and fingers.  Multifactorial, DM, Chemo, MM     Last Assessment & Plan:    Doing well on gabapentin  Formatting of this note might be different from the original.  Last Assessment & Plan:   Overall improved. Patient has discontinued gabapentin, did not feel much difference     For   Vitamin D deficiency 05/02/2020    Past Surgical History:  Procedure Laterality Date   CARDIAC CATHETERIZATION  2006   CORONARY ANGIOPLASTY WITH STENT PLACEMENT  2007   IR FLUORO GUIDE CV LINE RIGHT  05/20/2016   IR US GUIDE VASC ACCESS RIGHT  05/20/2016    Family History  Problem Relation Age of Onset   Arrhythmia Mother    Heart disease Mother    Diabetes Sister    Diabetes Brother    Cancer Brother    Stroke Father    Diabetes Brother     Social History   Socioeconomic History   Marital status: Married    Spouse name: Not on file   Number of children: 6   Years of education: Not on file   Highest education level: Not on file  Occupational History   Occupation: retired Optician, dispensing  Tobacco Use   Smoking status: Never   Smokeless tobacco: Never   Vaping Use   Vaping status: Never Used  Substance and Sexual Activity   Alcohol use: No   Drug use: No   Sexual activity: Not Currently  Other Topics Concern   Not on file  Social History Narrative   Not on file   Social Determinants of Health   Financial Resource Strain: Low Risk  (07/30/2022)   Overall Financial Resource Strain (CARDIA)    Difficulty of Paying Living Expenses: Not hard at all  Food Insecurity: No Food Insecurity (07/30/2022)   Hunger Vital Sign    Worried About Running Out of Food in the Last Year: Never true    Ran Out of Food in the Last Year: Never true  Transportation Needs: No Transportation Needs (07/30/2022)   PRAPARE - Administrator, Civil Service (Medical): No    Lack of Transportation (Non-Medical): No  Physical Activity: Insufficiently Active (07/30/2022)  Exercise Vital Sign    Days of Exercise per Week: 3 days    Minutes of Exercise per Session: 30 min  Stress: No Stress Concern Present (07/30/2022)   [employer] of Occupational Health - Occupational Stress Questionnaire    Feeling of Stress : Not at all  Social Connections: Moderately Integrated (07/30/2022)   Social Connection and Isolation Panel [NHANES]    Frequency of Communication with Friends and Family: More than three times a week    Frequency of Social Gatherings with Friends and Family: Three times a week    Attends Religious Services: More than 4 times per year    Active Member of Clubs or Organizations: No    Attends Banker Meetings: Never    Marital Status: Married    No Known Allergies  Outpatient Medications Prior to Visit  Medication Sig Dispense Refill   amLODipine (NORVASC) 10 MG tablet Take 1 tablet (10 mg total) by mouth daily. 90 tablet 2   atorvastatin (LIPITOR) 20 MG tablet Take 1 tablet (20 mg total) by mouth daily. 90 tablet 3   Cholecalciferol 10 MCG (400 UNIT) CAPS Take 1 capsule by mouth daily.     lisinopril (ZESTRIL) 2.5 MG  tablet Take 1 tablet (2.5 mg total) by mouth daily. 90 tablet 2   metoprolol succinate (TOPROL-XL) 100 MG 24 hr tablet Take 1 tablet (100 mg total) by mouth daily. 90 tablet 2   nitroGLYCERIN (NITROSTAT) 0.4 MG SL tablet Place 1 tablet (0.4 mg total) under the tongue every 5 (five) minutes x 3 doses as needed for chest pain. 30 tablet 12   vitamin B-12 (CYANOCOBALAMIN) 1000 MCG tablet Take 1,000 mcg by mouth daily.     dapagliflozin propanediol (FARXIGA) 10 MG TABS tablet Take 1 tablet (10 mg total) by mouth daily before breakfast. 30 tablet 0   ELIQUIS 5 MG TABS tablet Take 5 mg by mouth 2 (two) times daily.     gabapentin (NEURONTIN) 300 MG capsule Take 1 capsule (300 mg total) by mouth daily. 90 capsule 2   LANTUS SOLOSTAR 100 UNIT/ML Solostar Pen Inject 20 Units into the skin daily. 15 mL 2   amoxicillin (AMOXIL) 250 MG capsule Once before dental procedures (Patient not taking: Reported on 11/25/2022) 5 capsule 0   aspirin EC 81 MG tablet Take 1 tablet (81 mg total) by mouth daily. Swallow whole. (Patient not taking: Reported on 11/25/2022) 90 tablet 3   No facility-administered medications prior to visit.     ROS Review of Systems  Constitutional:  Negative for activity change and appetite change.  HENT:  Negative for sinus pressure and sore throat.   Respiratory:  Negative for chest tightness, shortness of breath and wheezing.   Cardiovascular:  Negative for chest pain and palpitations.  Gastrointestinal:  Negative for abdominal distention, abdominal pain and constipation.  Genitourinary: Negative.   Musculoskeletal: Negative.   Psychiatric/Behavioral:  Negative for behavioral problems and dysphoric mood.     Objective:  BP 131/75   Pulse 61   Ht 5\' 8"  (1.727 m)   Wt 176 lb 12.8 oz (80.2 kg)   SpO2 100%   BMI 26.88 kg/m      11/25/2022    3:51 PM 11/25/2022    3:15 PM 09/23/2022   12:35 PM  BP/Weight  Systolic BP 131 167 131  Diastolic BP 75 72 81  Wt. (Lbs)  176.8  180.5  BMI  26.88 kg/m2 27.44 kg/m2      Physical  Exam Constitutional:      Appearance: He is well-developed.  Cardiovascular:     Rate and Rhythm: Normal rate.     Heart sounds: Normal heart sounds. No murmur heard. Pulmonary:     Effort: Pulmonary effort is normal.     Breath sounds: Normal breath sounds. No wheezing or rales.  Chest:     Chest wall: No tenderness.  Abdominal:     General: Bowel sounds are normal. There is no distension.     Palpations: Abdomen is soft. There is no mass.     Tenderness: There is no abdominal tenderness.  Musculoskeletal:        General: Normal range of motion.     Right lower leg: No edema.     Left lower leg: No edema.  Neurological:     Mental Status: He is alert and oriented to person, place, and time.  Psychiatric:        Mood and Affect: Mood normal.        Latest Ref Rng & Units 09/16/2022    3:11 PM 06/03/2022   12:14 PM 05/23/2022   11:10 AM  CMP  Glucose 70 - 99 mg/dL 621  308  657   BUN 8 - 23 mg/dL 19  23  21    Creatinine 0.61 - 1.24 mg/dL 8.46  9.62  9.52   Sodium 135 - 145 mmol/L 138  138  138   Potassium 3.5 - 5.1 mmol/L 3.9  3.9  3.8   Chloride 98 - 111 mmol/L 105  105  106   CO2 22 - 32 mmol/L 29  27  26    Calcium 8.9 - 10.3 mg/dL 8.8  8.6  8.8   Total Protein 6.5 - 8.1 g/dL 7.4  7.1    Total Bilirubin 0.3 - 1.2 mg/dL 0.5  0.4    Alkaline Phos 38 - 126 U/L 67  80    AST 15 - 41 U/L 14  16    ALT 0 - 44 U/L 21  20      Lipid Panel     Component Value Date/Time   CHOL 137 07/03/2022 1227   TRIG 128 07/03/2022 1227   HDL 54 07/03/2022 1227   CHOLHDL 2.5 07/03/2022 1227   CHOLHDL 4 01/11/2013 1030   VLDL 16.8 01/11/2013 1030   LDLCALC 61 07/03/2022 1227    CBC    Component Value Date/Time   WBC 3.8 (L) 09/16/2022 1511   WBC 4.0 05/23/2022 1110   RBC 4.61 09/16/2022 1511   HGB 13.2 09/16/2022 1511   HGB 12.3 (L) 01/08/2017 1338   HCT 40.7 09/16/2022 1511   HCT 37.7 (L) 01/08/2017 1338   PLT 196  09/16/2022 1511   PLT 174 01/08/2017 1338   MCV 88.3 09/16/2022 1511   MCV 85.5 01/08/2017 1338   MCH 28.6 09/16/2022 1511   MCHC 32.4 09/16/2022 1511   RDW 16.0 (H) 09/16/2022 1511   RDW 15.7 (H) 01/08/2017 1338   LYMPHSABS 1.1 09/16/2022 1511   LYMPHSABS 0.9 01/08/2017 1338   MONOABS 0.5 09/16/2022 1511   MONOABS 0.8 01/08/2017 1338   EOSABS 0.1 09/16/2022 1511   EOSABS 0.5 01/08/2017 1338   BASOSABS 0.0 09/16/2022 1511   BASOSABS 0.0 01/08/2017 1338    Lab Results  Component Value Date   HGBA1C 6.7 11/25/2022    Assessment & Plan:      Type 2 Diabetes Mellitus Well controlled with A1c of 6.7. No reported hypoglycemic episodes. Patient  is enrolled in a diabetes management program and is compliant with home glucose monitoring. -Continue Farxiga and Lantus 20 units daily. -If hypoglycemic symptoms occur, decrease Lantus to 18 units daily. -Check A1c and kidney function in 3 months.  Hypertension Blood pressure was slightly elevated during the visit, but was normal during a recent cardiology visit. -Recheck blood pressure after rest are normal. -Continue current antihypertensive medications (Metoprolol, Lisinopril, Amlodipine).  Multiple Myeloma in remission In remission and patient is under the care of a hematologist. -No changes to current management.  Stage III CKD Combination of hypertensive and diabetic retinopathy. Creatinine has been stable at 1.64 Avoid nephrotoxins  General Health Maintenance -Administer influenza vaccine today. -Schedule follow-up visit in 3 months for A1c and kidney function check.          Meds ordered this encounter  Medications   dapagliflozin propanediol (FARXIGA) 10 MG TABS tablet    Sig: Take 1 tablet (10 mg total) by mouth daily before breakfast.    Dispense:  90 tablet    Refill:  1   LANTUS SOLOSTAR 100 UNIT/ML Solostar Pen    Sig: Inject 20 Units into the skin daily.    Dispense:  15 mL    Refill:  6   gabapentin  (NEURONTIN) 300 MG capsule    Sig: Take 1 capsule (300 mg total) by mouth daily.    Dispense:  90 capsule    Refill:  1    Follow-up: Return in about 3 months (around 02/25/2023) for Chronic medical conditions.       Hoy Register, MD, FAAFP. [facility] [place], [place] [phone]   11/25/2022, 4:56 PM

## 2022-11-25 NOTE — Patient Instructions (Addendum)
VISIT SUMMARY:  You came in today for a routine follow-up visit. We discussed your diabetes, hypertension, multiple myeloma, and general health maintenance. Your diabetes is well controlled, and you are doing a great job with your home glucose monitoring. We also talked about your blood pressure and your ongoing management for multiple myeloma. Additionally, we addressed your general health maintenance needs, including the flu shot.  YOUR PLAN:  -TYPE 2 DIABETES MELLITUS: Type 2 Diabetes Mellitus is a condition where your body does not use insulin properly, leading to high blood sugar levels. Your A1c is 6.7, indicating good control. Continue taking Farxiga and Lantus 20 units daily. If you experience symptoms of low blood sugar, reduce Lantus to 18 units daily. We will check your A1c and kidney function in 3 months.  -HYPERTENSION: Hypertension is high blood pressure, which can lead to serious health problems if not managed. Your blood pressure was slightly elevated today but was normal on repeat.  Continue taking your current medications: Metoprolol, Lisinopril, and Amlodipine.  -MULTIPLE MYELOMA: Multiple Myeloma is a type of blood cancer that is currently in remission. You are under the care of a hematologist, and no changes to your current management are needed.  -GENERAL HEALTH MAINTENANCE: For your general health, we will administer the influenza vaccine today. Please schedule a follow-up visit in 3 months for an A1c and kidney function check.  INSTRUCTIONS:  Please schedule a follow-up visit in 3 months for an A1c and kidney function check.

## 2022-12-01 ENCOUNTER — Other Ambulatory Visit: Payer: Self-pay | Admitting: *Deleted

## 2022-12-01 DIAGNOSIS — I70213 Atherosclerosis of native arteries of extremities with intermittent claudication, bilateral legs: Secondary | ICD-10-CM

## 2022-12-03 ENCOUNTER — Other Ambulatory Visit: Payer: Self-pay

## 2022-12-07 DIAGNOSIS — Z794 Long term (current) use of insulin: Secondary | ICD-10-CM | POA: Diagnosis not present

## 2022-12-07 DIAGNOSIS — E119 Type 2 diabetes mellitus without complications: Secondary | ICD-10-CM | POA: Diagnosis not present

## 2022-12-09 DIAGNOSIS — C61 Malignant neoplasm of prostate: Secondary | ICD-10-CM | POA: Diagnosis not present

## 2022-12-12 ENCOUNTER — Other Ambulatory Visit: Payer: Self-pay

## 2022-12-12 DIAGNOSIS — Z794 Long term (current) use of insulin: Secondary | ICD-10-CM | POA: Diagnosis not present

## 2022-12-12 DIAGNOSIS — E119 Type 2 diabetes mellitus without complications: Secondary | ICD-10-CM | POA: Diagnosis not present

## 2022-12-15 ENCOUNTER — Ambulatory Visit (HOSPITAL_COMMUNITY)
Admission: RE | Admit: 2022-12-15 | Discharge: 2022-12-15 | Disposition: A | Discharge status: Home / Self Care | Payer: Medicare PPO | Source: Ambulatory Visit | Attending: Surgery | Admitting: Surgery

## 2022-12-15 ENCOUNTER — Encounter: Payer: Self-pay | Admitting: Surgery

## 2022-12-15 ENCOUNTER — Ambulatory Visit: Payer: Medicare PPO | Admitting: Surgery

## 2022-12-15 VITALS — BP 152/82 | HR 68 | Temp 97.8°F | Resp 20 | Ht 68.0 in | Wt 178.0 lb

## 2022-12-15 DIAGNOSIS — I739 Peripheral vascular disease, unspecified: Secondary | ICD-10-CM

## 2022-12-15 DIAGNOSIS — I70213 Atherosclerosis of native arteries of extremities with intermittent claudication, bilateral legs: Secondary | ICD-10-CM | POA: Insufficient documentation

## 2022-12-15 LAB — VAS US ABI WITH/WO TBI
Left ABI: 0.75
Right ABI: 0.69

## 2022-12-15 NOTE — Progress Notes (Signed)
[facility]  Patient name: Douglas Rose. MRN: [id number] DOB: Jul 06, 1945 Sex: male   REASON FOR VISIT:    Follow up  HISOTRY OF PRESENT ILLNESS:   Douglas Rose. is a 77 y.o. male who I saw in the emergency department in August 2023.  He underwent a CT scan for pleuritic chest pain and was found to have a PE.  Ultrasound also showed thrombus in the left posterior tibial vein.  He was incidentally found to have a left superficial femoral artery occlusion.  He told me he could walk a fairly long distance but occasionally get left leg weakness.  He was started on a DOAC and fitted for compression stockings.  He was told to follow-up with me for claudication evaluation.  He continues to be without any significant symptoms.  He does not have any open wounds   The patient has a history of coronary artery disease, status post PCI. He has a diabetic. He takes a statin for hypercholesterolemia. He is a non-smoker.  He is managed for multiple myeloma which is in remission   PAST MEDICAL HISTORY:   Past Medical History:  Diagnosis Date   Arterial insufficiency of lower extremity (HCC) 09/01/2016   Overview:  Mild (2018)  Last Assessment & Plan:  Mild arterial insufficiency, he does not have classic symptoms of claudication. Advised daily foot checks and would consider further evaluation if new or worsening symptoms  Mild (2018)  Last Assessment & Plan:  asymptomatic.  We will follow-up as needed Formatting of this note might be different from the original. Mild (2018)  Last Assessment & Pla   Benign prostatic hyperplasia 05/02/2020   CAD (coronary artery disease) 2006   stent LAD 3.0x18 [place], occluded RCA   Cerumen impaction 10/21/2021   Last Assessment & Plan: Formatting of this note might be different from the original.  Right cerumen impaction cleared with irrigation.  Left with yellow appearing discharge,  patient  reports he also recently been flushing it at home. Patient denies any pain.  Concern for immunocompromised, chronic otorrhea, will rx ofloxacin drops per formualry   Cervical lymphadenopathy 03/31/2016   Colitis 05/12/2016   Elevated PSA 01/24/2015   Last Assessment & Plan: Formatting of this note might be different from the original.  Reports he has a pending biopsy which will be rescheduled due to recent DVT.  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  Patient is up-to-date on Covid booster   Infected tooth 03/31/2016   Monoclonal gammopathy    Multiple myeloma in remission (HCC) 07/22/2016   Last Assessment & Plan:  Formatting of this note might be different from the original.  Patient remains in remission, followed by oncology.  Had labs drawn today   Peripheral neuropathy 07/22/2016   Last Assessment & Plan:  Overall improved. Patient has discontinued gabapentin, did not feel much difference  Feet and fingers.  Multifactorial, DM, Chemo, MM  Last Assessment & Plan:   Doing well on gabapentin Formatting of this note might be different from the original. Last Assessment & Plan:  Overall improved.  Patient has discontinued gabapentin, did not feel much difference   Screening for malignant neoplasm of colon 05/02/2020   Stage 3 chronic kidney disease (HCC) 06/13/2016   Last Assessment & Plan:  Will check Cr/K today  June 2020, annual follow-up [place] [facility]  Last Assessment & Plan:   Recent labs reviewed- stable Formatting of this note might be different from the original. Last Assessment & Plan:  Will check  Cr/K today   Type 2 diabetes mellitus (HCC) 07/21/2011   Last Assessment & Plan:   Diabetes improved with fasting glucoses in a safe range.  Continue current regimen. Formatting of this note might be different from the original. Last Assessment & Plan:  Patient's diabetes has been well controlled at baseline on 12 units of Lantus daily.  He reports some hyperglycemia on the first few days after dexamethasone with resolution by the end of the week. 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 Patient has discontinued gabapentin, did not feel much difference     Feet and fingers.  Multifactorial, DM, Chemo, MM     Last Assessment & Plan:    Doing well on gabapentin  Formatting of this note might be different from the original.  Last Assessment & Plan:   Overall improved. Patient has discontinued gabapentin, did not feel much difference     For   Vitamin D deficiency 05/02/2020     FAMILY HISTORY:   Family History  Problem Relation Age of Onset   Arrhythmia Mother    Heart disease Mother    Diabetes Sister    Diabetes Brother    Cancer Brother    Stroke Father    Diabetes Brother     SOCIAL HISTORY:   Social History   Tobacco Use   Smoking status: Never   Smokeless tobacco: Never  Substance Use Topics   Alcohol use: No     ALLERGIES:   No Known Allergies   CURRENT MEDICATIONS:   Current Outpatient Medications  Medication Sig Dispense Refill   amLODipine (NORVASC) 10 MG tablet Take 1 tablet (10 mg total) by mouth daily. 90 tablet 2   aspirin EC 81 MG tablet Take 1 tablet (81 mg total) by mouth daily. Swallow whole. 90 tablet 3   atorvastatin (LIPITOR) 20 MG tablet Take 1 tablet (20 mg total) by mouth daily. 90 tablet 3   Cholecalciferol 10 MCG (400 UNIT) CAPS Take 1 capsule by mouth daily.     dapagliflozin propanediol (FARXIGA) 10 MG TABS tablet Take 1 tablet (10 mg total) by mouth daily before  breakfast. 90 tablet 1   gabapentin (NEURONTIN) 300 MG capsule Take 1 capsule (300 mg total) by mouth daily. 90 capsule 1   LANTUS SOLOSTAR 100 UNIT/ML Solostar Pen Inject 20 Units into the skin daily. 15 mL 6   lisinopril (ZESTRIL) 2.5 MG tablet Take 1 tablet (2.5 mg total) by mouth daily. 90 tablet 2   metoprolol succinate (TOPROL-XL) 100 MG 24 hr tablet Take 1 tablet (100 mg total) by mouth daily. 90 tablet 2   nitroGLYCERIN (NITROSTAT) 0.4 MG SL tablet Place 1 tablet (0.4 mg total) under the tongue every 5 (five) minutes x 3 doses as needed for chest pain. 30 tablet 12   vitamin B-12 (CYANOCOBALAMIN) 1000 MCG tablet Take 1,000 mcg by mouth daily.     amoxicillin (AMOXIL) 250 MG capsule Once before dental procedures (Patient not taking: Reported  on 11/25/2022) 5 capsule 0   No current facility-administered medications for this visit.    REVIEW OF SYSTEMS:   [X]  denotes positive finding, [ ]  denotes negative finding Cardiac  Comments:  Chest pain or chest pressure:    Shortness of breath upon exertion:    Short of breath when lying flat:    Irregular heart rhythm:        Vascular    Pain in calf, thigh, or hip brought on by ambulation:    Pain in feet at night that wakes you up from your sleep:     Blood clot in your veins:    Leg swelling:         Pulmonary    Oxygen at home:    Productive cough:     Wheezing:         Neurologic    Sudden weakness in arms or legs:     Sudden numbness in arms or legs:     Sudden onset of difficulty speaking or slurred speech:    Temporary loss of vision in one eye:     Problems with dizziness:         Gastrointestinal    Blood in stool:     Vomited blood:         Genitourinary    Burning when urinating:     Blood in urine:        Psychiatric    Major depression:         Hematologic    Bleeding problems:    Problems with blood clotting too easily:        Skin    Rashes or ulcers:        Constitutional    Fever or chills:       PHYSICAL EXAM:   Vitals:   12/15/22 1402  BP: (!) 152/82  Pulse: 68  Resp: 20  Temp: 97.8 F (36.6 C)  SpO2: 98%  Weight: 178 lb (80.7 kg)  Height: 5\' 8"  (1.727 m)    GENERAL: The patient is a well-nourished male, in no acute distress. The vital signs are documented above. CARDIAC: There is a regular rate and rhythm.  VASCULAR: Nonpalpable pedal pulses PULMONARY: Non-labored respirations ABDOMEN: Soft and non-tender with normal pitched bowel sounds.  MUSCULOSKELETAL: There are no major deformities or cyanosis. NEUROLOGIC: No focal weakness or paresthesias are detected. SKIN: There are no ulcers or rashes noted. PSYCHIATRIC: The patient has a normal affect.  STUDIES:   I have reviewed the following: ABI/TBIToday's ABIToday's TBIPrevious ABIPrevious TBI  +-------+-----------+-----------+------------+------------+  Right 0.69       0.48       0.79        0.60          +-------+-----------+-----------+------------+------------+  Left  0.75       0.59       0.91        0.55          +-------+-----------+-  Right toe pressure: 79 Left toe pressure: 96 Waveforms are all monophasic MEDICAL ISSUES:   PAD: The patient has known SFA disease bilaterally.  He remains asymptomatic.  He does not get claudication symptoms.  No intervention is recommended.  He was told to continue his current medication profile which includes an aspirin and statin as well as blood pressure control.  He should continue with exercise.  He knows to contact me should he develop a nonhealing wound or change in symptoms.  Otherwise he  will follow-up in 1 year with ABIs.    Charlena Cross, MD, FACS [facility] [phone] Pager [phone]

## 2022-12-16 ENCOUNTER — Other Ambulatory Visit: Payer: Self-pay

## 2022-12-16 DIAGNOSIS — C9001 Multiple myeloma in remission: Secondary | ICD-10-CM

## 2022-12-17 ENCOUNTER — Inpatient Hospital Stay: Payer: Medicare PPO | Attending: Hematology

## 2022-12-17 ENCOUNTER — Other Ambulatory Visit: Payer: Self-pay

## 2022-12-17 DIAGNOSIS — N289 Disorder of kidney and ureter, unspecified: Secondary | ICD-10-CM | POA: Diagnosis not present

## 2022-12-17 DIAGNOSIS — D649 Anemia, unspecified: Secondary | ICD-10-CM | POA: Insufficient documentation

## 2022-12-17 DIAGNOSIS — Z86711 Personal history of pulmonary embolism: Secondary | ICD-10-CM | POA: Diagnosis not present

## 2022-12-17 DIAGNOSIS — E119 Type 2 diabetes mellitus without complications: Secondary | ICD-10-CM | POA: Insufficient documentation

## 2022-12-17 DIAGNOSIS — C61 Malignant neoplasm of prostate: Secondary | ICD-10-CM | POA: Diagnosis not present

## 2022-12-17 DIAGNOSIS — C9001 Multiple myeloma in remission: Secondary | ICD-10-CM | POA: Insufficient documentation

## 2022-12-17 DIAGNOSIS — Z79899 Other long term (current) drug therapy: Secondary | ICD-10-CM | POA: Diagnosis not present

## 2022-12-17 DIAGNOSIS — I739 Peripheral vascular disease, unspecified: Secondary | ICD-10-CM | POA: Insufficient documentation

## 2022-12-17 LAB — CMP (CANCER CENTER ONLY)
ALT: 18 U/L (ref 0–44)
AST: 16 U/L (ref 15–41)
Albumin: 3.9 g/dL (ref 3.5–5.0)
Alkaline Phosphatase: 75 U/L (ref 38–126)
Anion gap: 5 (ref 5–15)
BUN: 20 mg/dL (ref 8–23)
CO2: 27 mmol/L (ref 22–32)
Calcium: 9 mg/dL (ref 8.9–10.3)
Chloride: 108 mmol/L (ref 98–111)
Creatinine: 1.67 mg/dL — ABNORMAL HIGH (ref 0.61–1.24)
GFR, Estimated: 41 mL/min — ABNORMAL LOW (ref >60)
Glucose, Bld: 131 mg/dL — ABNORMAL HIGH (ref 70–99)
Potassium: 3.5 mmol/L (ref 3.5–5.1)
Sodium: 140 mmol/L (ref 135–145)
Total Bilirubin: 0.4 mg/dL (ref <1.2)
Total Protein: 7.8 g/dL (ref 6.5–8.1)

## 2022-12-17 LAB — CBC WITH DIFFERENTIAL (CANCER CENTER ONLY)
Abs Immature Granulocytes: 0.01 10*3/uL (ref 0.00–0.07)
Basophils Absolute: 0 10*3/uL (ref 0.0–0.1)
Basophils Relative: 0 %
Eosinophils Absolute: 0.1 10*3/uL (ref 0.0–0.5)
Eosinophils Relative: 3 %
HCT: 43.3 % (ref 39.0–52.0)
Hemoglobin: 14.2 g/dL (ref 13.0–17.0)
Immature Granulocytes: 0 %
Lymphocytes Relative: 30 %
Lymphs Abs: 1.1 10*3/uL (ref 0.7–4.0)
MCH: 28.4 pg (ref 26.0–34.0)
MCHC: 32.8 g/dL (ref 30.0–36.0)
MCV: 86.6 fL (ref 80.0–100.0)
Monocytes Absolute: 0.5 10*3/uL (ref 0.1–1.0)
Monocytes Relative: 14 %
Neutro Abs: 2 10*3/uL (ref 1.7–7.7)
Neutrophils Relative %: 53 %
Platelet Count: 172 10*3/uL (ref 150–400)
RBC: 5 MIL/uL (ref 4.22–5.81)
RDW: 16.6 % — ABNORMAL HIGH (ref 11.5–15.5)
WBC Count: 3.7 10*3/uL — ABNORMAL LOW (ref 4.0–10.5)
nRBC: 0 % (ref 0.0–0.2)

## 2022-12-18 ENCOUNTER — Other Ambulatory Visit: Payer: Self-pay

## 2022-12-18 DIAGNOSIS — I739 Peripheral vascular disease, unspecified: Secondary | ICD-10-CM

## 2022-12-19 LAB — KAPPA/LAMBDA LIGHT CHAINS
Kappa free light chain: 32.3 mg/L — ABNORMAL HIGH (ref 3.3–19.4)
Kappa, lambda light chain ratio: 1.66 — ABNORMAL HIGH (ref 0.26–1.65)
Lambda free light chains: 19.4 mg/L (ref 5.7–26.3)

## 2022-12-24 ENCOUNTER — Other Ambulatory Visit: Payer: Self-pay

## 2022-12-24 ENCOUNTER — Inpatient Hospital Stay: Payer: Medicare PPO | Admitting: Hematology

## 2022-12-24 VITALS — BP 161/89 | HR 63 | Temp 97.2°F | Resp 18 | Wt 176.1 lb

## 2022-12-24 DIAGNOSIS — I739 Peripheral vascular disease, unspecified: Secondary | ICD-10-CM | POA: Diagnosis not present

## 2022-12-24 DIAGNOSIS — Z86711 Personal history of pulmonary embolism: Secondary | ICD-10-CM | POA: Diagnosis not present

## 2022-12-24 DIAGNOSIS — N289 Disorder of kidney and ureter, unspecified: Secondary | ICD-10-CM | POA: Diagnosis not present

## 2022-12-24 DIAGNOSIS — C9001 Multiple myeloma in remission: Secondary | ICD-10-CM | POA: Diagnosis not present

## 2022-12-24 DIAGNOSIS — D649 Anemia, unspecified: Secondary | ICD-10-CM | POA: Diagnosis not present

## 2022-12-24 DIAGNOSIS — Z79899 Other long term (current) drug therapy: Secondary | ICD-10-CM | POA: Diagnosis not present

## 2022-12-24 DIAGNOSIS — C61 Malignant neoplasm of prostate: Secondary | ICD-10-CM | POA: Diagnosis not present

## 2022-12-24 DIAGNOSIS — E119 Type 2 diabetes mellitus without complications: Secondary | ICD-10-CM | POA: Diagnosis not present

## 2022-12-24 NOTE — Progress Notes (Signed)
[name]  HEMATOLOGY ONCOLOGY CLINIC NOTE  Date of service: 12/24/22   Patient Care Team: Hoy Register, MD as PCP - General (Family Medicine) Johney Maine, MD as Consulting Physician (Hematology) Illene Labrador, OD (Optometry)  Chief Complaint : F/u for continued evaluation management of multiple myeloma  Diagnosis:  IgG Kappa multiple myeloma   Current Treatment: Surveillance   INTERVAL HISTORY:  Douglas Rose. is here for continued evaluation and management of his multiple myeloma.   Patient was last seen by me on 09/23/2022 and was doing well overall.  Today, he is accompanied by his wife. Patient reports that he has been doing well overall.   He reports having a biopsy recently and was diagnosed with prostate cancer. Patient notes that his prostate cancer is considered low-risk and is monitored every few months without treatment. His urologist is Dr. Annabell Howells. He denies any symptoms related to his prostate cancer and denies any new urinary or bowel symptoms.   Patient received a lower extremity doppler study on 12/15/2022 and there were indications of peripheral artery disease. His peripheral artery disease is monitored annually. Patient reports very mild leg cramping leg when lying down.   Patient reports that sometimes when sleeping in a certain position, there is some mild nodular swelling.   Patient reports that he is UTD with his flu shot, but has not received the COVID-19 booster and is unsure if he is UTD with the RSV vaccine. He denies any infection issues.   REVIEW OF SYSTEMS:    10 Point review of Systems was done is negative except as noted above.    Past Medical History:  Diagnosis Date   Arterial insufficiency of lower extremity (HCC) 09/01/2016   Overview:  Mild (2018)  Last Assessment & Plan:  Mild arterial insufficiency, he does not have classic symptoms of claudication. Advised daily foot checks and would consider further evaluation if new or  worsening symptoms  Mild (2018)  Last Assessment & Plan:  asymptomatic.  We will follow-up as needed Formatting of this note might be different from the original. Mild (2018)  Last Assessment & Pla   Benign prostatic hyperplasia 05/02/2020   CAD (coronary artery disease) 2006   stent LAD 3.0x18 Cypher, occluded RCA   Cerumen impaction 10/21/2021   Last Assessment & Plan: Formatting of this note might be different from the original.  Right cerumen impaction cleared with irrigation.  Left with yellow appearing discharge,  patient reports he also recently been flushing it at home. Patient denies any pain.  Concern for immunocompromised, chronic otorrhea, will rx ofloxacin drops per formualry   Cervical lymphadenopathy 03/31/2016   Colitis 05/12/2016   Elevated PSA 01/24/2015   Last Assessment & Plan: Formatting of this note might be different from the original.  Reports he has a pending biopsy which will be rescheduled due to recent DVT.  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  Patient is up-to-date on Covid booster   Infected tooth 03/31/2016   Monoclonal gammopathy    Multiple myeloma in remission (HCC) 07/22/2016   Last Assessment & Plan:  Formatting of this  note might be different from the original.  Patient remains in remission, followed by oncology.  Had labs drawn today   Peripheral neuropathy 07/22/2016   Last Assessment & Plan:  Overall improved. Patient has discontinued gabapentin, did not feel much difference  Feet and fingers.  Multifactorial, DM, Chemo, MM  Last Assessment & Plan:   Doing well on  gabapentin Formatting of this note might be different from the original. Last Assessment & Plan:  Overall improved. Patient has discontinued gabapentin, did not feel much difference   Screening for malignant neoplasm of colon 05/02/2020   Stage 3 chronic kidney disease (HCC) 06/13/2016   Last Assessment & Plan:  Will check Cr/K today  June 2020, annual follow-up [place] [facility]  Last Assessment & Plan:   Recent labs reviewed- stable Formatting of this note might be different from the original. Last Assessment & Plan:  Will check Cr/K today   Type 2 diabetes mellitus (HCC) 07/21/2011   Last Assessment & Plan:   Diabetes improved with fasting glucoses in a safe range.  Continue current regimen. Formatting of this note might be different from the original. Last Assessment & Plan:  Patient's diabetes has been well controlled at baseline on 12 units of Lantus daily.  He reports some hyperglycemia on the first few days after dexamethasone with resolution by the end of the week. 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 Patient has discontinued gabapentin, did not feel much difference     Feet and fingers.  Multifactorial, DM, Chemo, MM     Last Assessment & Plan:    Doing well on gabapentin  Formatting of this note might be different from the original.  Last Assessment & Plan:   Overall improved. Patient has discontinued gabapentin, did not feel much difference     For   Vitamin D deficiency 05/02/2020    . Past Surgical History:  Procedure Laterality Date   CARDIAC CATHETERIZATION  2006   CORONARY ANGIOPLASTY WITH STENT PLACEMENT  2007   IR FLUORO GUIDE CV LINE RIGHT  05/20/2016   IR US GUIDE VASC ACCESS RIGHT  05/20/2016    . Social History   Tobacco Use   Smoking status: Never   Smokeless tobacco: Never  Vaping Use   Vaping status: Never Used  Substance Use Topics   Alcohol use: No   Drug use: No     ALLERGIES:  has no known allergies.  MEDICATIONS:  Current Outpatient Medications  Medication Sig Dispense Refill   amLODipine (NORVASC) 10 MG tablet Take 1 tablet (10 mg total) by mouth daily. 90 tablet 2   amoxicillin (AMOXIL) 250 MG capsule Once before dental procedures (Patient not taking: Reported on 11/25/2022) 5 capsule 0   aspirin EC 81 MG tablet Take 1 tablet (81 mg total) by mouth daily. Swallow whole. 90 tablet 3   atorvastatin (LIPITOR) 20 MG tablet Take 1 tablet (20 mg total) by mouth daily. 90 tablet 3   Cholecalciferol 10 MCG (400 UNIT) CAPS Take 1 capsule by mouth daily.     dapagliflozin propanediol (FARXIGA) 10 MG TABS tablet Take 1 tablet (10 mg total) by mouth daily before breakfast. 90 tablet 1   gabapentin (NEURONTIN) 300 MG capsule Take 1 capsule (300 mg total) by mouth daily. 90 capsule 1   LANTUS SOLOSTAR 100 UNIT/ML Solostar Pen Inject 20 Units into the skin daily. 15 mL 6   lisinopril (  ZESTRIL) 2.5 MG tablet Take 1 tablet (2.5 mg total) by mouth daily. 90 tablet 2   metoprolol succinate (TOPROL-XL) 100 MG 24 hr tablet Take 1 tablet (100 mg total) by mouth daily. 90 tablet 2   nitroGLYCERIN (NITROSTAT) 0.4 MG SL tablet Place 1 tablet (0.4 mg total) under the tongue every 5 (five) minutes x 3 doses as needed for chest pain. 30 tablet 12   vitamin B-12 (CYANOCOBALAMIN) 1000 MCG tablet Take 1,000 mcg by mouth daily.     No current facility-administered medications for this visit.    PHYSICAL EXAMINATION: .BP (!) 161/89   Pulse 63   Temp (!) 97.2 F (36.2 C)   Resp 18   Wt 176 lb 1.6 oz (79.9 kg)   SpO2 100%   BMI 26.78 kg/m    GENERAL:alert, in no acute distress and comfortable SKIN: no acute rashes, no significant lesions EYES: conjunctiva are pink and non-injected, sclera anicteric OROPHARYNX: MMM, no exudates, no oropharyngeal erythema or ulceration NECK: supple, no JVD LYMPH:  no palpable lymphadenopathy in the cervical, axillary or inguinal  regions LUNGS: clear to auscultation b/l with normal respiratory effort HEART: regular rate & rhythm ABDOMEN:  normoactive bowel sounds , non tender, not distended. Extremity: no pedal edema PSYCH: alert & oriented x 3 with fluent speech NEURO: no focal motor/sensory deficits    LABORATORY DATA:   I have reviewed the data as listed     Latest Ref Rng & Units 12/17/2022    1:03 PM 09/16/2022    3:11 PM 06/03/2022   12:14 PM  CBC  WBC 4.0 - 10.5 K/uL 3.7  3.8  3.9   Hemoglobin 13.0 - 17.0 g/dL 78.2  95.6  21.3   Hematocrit 39.0 - 52.0 % 43.3  40.7  36.2   Platelets 150 - 400 K/uL 172  196  184        Latest Ref Rng & Units 12/17/2022    1:03 PM 09/16/2022    3:11 PM 06/03/2022   12:14 PM  CMP  Glucose 70 - 99 mg/dL 086  578  469   BUN 8 - 23 mg/dL 20  19  23    Creatinine 0.61 - 1.24 mg/dL 6.29  5.28  4.13   Sodium 135 - 145 mmol/L 140  138  138   Potassium 3.5 - 5.1 mmol/L 3.5  3.9  3.9   Chloride 98 - 111 mmol/L 108  105  105   CO2 22 - 32 mmol/L 27  29  27    Calcium 8.9 - 10.3 mg/dL 9.0  8.8  8.6   Total Protein 6.5 - 8.1 g/dL 7.8  7.4  7.1   Total Bilirubin <1.2 mg/dL 0.4  0.5  0.4   Alkaline Phos 38 - 126 U/L 75  67  80   AST 15 - 41 U/L 16  14  16    ALT 0 - 44 U/L 18  21  20         RADIOGRAPHIC STUDIES: I have personally reviewed the radiological images as listed and agreed with the findings in the report. VAS [name] ABI WITH/WO TBI Result Date: 12/15/2022  LOWER EXTREMITY DOPPLER STUDY Patient Name:  KAYL MARTINEZ.  Date of Exam:   12/15/2022 Medical Rec #: [id number]           Accession #:    [id number] Date of Birth: 1945-02-22          Patient Gender: M Patient Age:   77 years  Exam Location:  [facility] Procedure:      VAS [name] ABI WITH/WO TBI Referring Phys: Coral Else --------------------------------------------------------------------------------  Indications: Peripheral artery disease. High Risk         Hypertension, hyperlipidemia,  Diabetes, coronary artery Factors:          disease.  Performing Technologist: [place] Ponder RVS  Examination Guidelines: A complete evaluation includes at minimum, Doppler waveform signals and systolic blood pressure reading at the level of bilateral brachial, anterior tibial, and posterior tibial arteries, when vessel segments are accessible. Bilateral testing is considered an integral part of a complete examination. Photoelectric Plethysmograph (PPG) waveforms and toe systolic pressure readings are included as required and additional duplex testing as needed. Limited examinations for reoccurring indications may be performed as noted.  ABI Findings: +---------+------------------+-----+----------+--------+ Right    Rt Pressure (mmHg)IndexWaveform  Comment  +---------+------------------+-----+----------+--------+ Brachial 163                                       +---------+------------------+-----+----------+--------+ PTA      113               0.69 monophasic         +---------+------------------+-----+----------+--------+ DP       112               0.69 monophasic         +---------+------------------+-----+----------+--------+ Great Toe79                0.48                    +---------+------------------+-----+----------+--------+ +---------+------------------+-----+----------+-------+ Left     Lt Pressure (mmHg)IndexWaveform  Comment +---------+------------------+-----+----------+-------+ Brachial 159                                      +---------+------------------+-----+----------+-------+ PTA      123               0.75 monophasic        +---------+------------------+-----+----------+-------+ DP       77                0.47 monophasic        +---------+------------------+-----+----------+-------+ Great Toe96                0.59                   +---------+------------------+-----+----------+-------+  +-------+-----------+-----------+------------+------------+ ABI/TBIToday's ABIToday's TBIPrevious ABIPrevious TBI +-------+-----------+-----------+------------+------------+ Right  0.69       0.48       0.79        0.60         +-------+-----------+-----------+------------+------------+ Left   0.75       0.59       0.91        0.55         +-------+-----------+-----------+------------+------------+ Bilateral ABIs appear decreased compared to prior study on 12/10/22.  Summary: Right: Resting right ankle-brachial index indicates moderate right lower extremity arterial disease. The right toe-brachial index is abnormal. Left: Resting left ankle-brachial index indicates moderate left lower extremity arterial disease. The left toe-brachial index is abnormal. *See table(s) above for measurements and observations.  Electronically signed by Coral Else MD on 12/15/2022 at 3:05:57 PM.    Final     ASSESSMENT &  PLAN:   77 y.o.  with multiple medical comorbidities but fairly good performance status overall with   1) ISS Stage II IgG [place] multiple myeloma. Anemia + Renal insufficiency.  Bone survey neg for concerning bone lesions. significantly elevated kappa free light chains 858, lambda 18.4, with a ratio of 46.65 . He was also noted to have an M spike of 1.7 g/dL with IFE showing IgG kappa monoclonal protein. Quantitative IgG level was increased to nearly 2500 mg/dL. 24-hour UPEP showed monoclonal protein of 1300 mg per 24 hours which constituted about 86% of all the urinary protein. Bone marrow biopsy showed 20-30% Restricted plasma cells consistent with multiple myeloma Cytogenetics/FISH  Showed +4, +11, and +17   Patient has been treated with CyBord X 2 cycles . Treatment interrupted due to severe c diff colitis with prolonged hospitalization and decline in performance status.  Patient is much improved and back to baseline now. M protein undetected with neg IFE Has been treated with 5  cycles of Rd  Had been on maintenance Revlimid for more than 2 years until PEs in August 2023.  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 Had an element of obstructive uropathy due to BPH which has resolved. Creatinine continues to be stable.   3) Normocytic anemia likely related to multiple myeloma.-stable. No abdominal pain or discomfort over the right upper quadrant. No fevers or chills.  #4 right lower and middle lobe pulmonary embolism August 2023-Revlimid possible provoking factor. currently off Revlimid. 4) DM2 - controlled -continue management per PC   5) Concern for skin cancer lesion on forehead  PLAN:   -Discussed lab results from 12/17/2022 in detail with patient. CBC stable, showed WBC of 3.7K, hemoglobin improved to 14.2, and platelets of 172K. -CMP stable. 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 His light chains are stable with no sign progression of myeloma  -myeloma lab from today is pending at this time -Patient has no obvious lab or clinical evidence of myeloma progression at this time.   -no indication for additional treatment at this time -educated patient that a Gleason score would indicate how aggressively his prostate cancer may behave. Also discussed that the Gleason score, along with PSA level would determine risk profile. If needed, there may be a role for PET scan to ensure that his prostate cancer is localized.  -educated patient on claudications.  -recommend patient to stay UTD with his age-appropriate vaccines, including flu, COVID-19 booster, and RSV -discussed option to alternate visits with phone visits if he has no new symptoms -answered all of patient's and his wife's questions in detail -continue to follow up with urologist, Dr. Annabell Howells  FOLLOW-UP: Phone visit with Dr Candise Che in 3 months with labs 7-10 days prior to phone visit  The total time spent in the  appointment was 23 minutes* .  All of the patient's questions were answered with apparent satisfaction. The patient knows to call the clinic with any problems, questions or concerns.   Wyvonnia Lora MD MS AAHIVMS [facility] Hematology/Oncology Physician [facility]  .*Total Encounter Time as defined by the Centers for Medicare and Medicaid Services includes, in addition to the face-to-face time of a patient visit (documented in the note above) non-face-to-face time: obtaining and reviewing outside history, ordering and reviewing medications, tests or procedures, care coordination (communications with other health care professionals or caregivers) and documentation in the medical record.    I,Mitra  Faeizi,acting as a Neurosurgeon for Wyvonnia Lora, MD.,have documented all relevant documentation on the behalf of Wyvonnia Lora, MD,as directed by  Wyvonnia Lora, MD while in the presence of Wyvonnia Lora, MD.  .I have reviewed the above documentation for accuracy and completeness, and I agree with the above. Johney Maine MD

## 2022-12-26 LAB — MULTIPLE MYELOMA PANEL, SERUM
Albumin SerPl Elph-Mcnc: 3.6 g/dL (ref 2.9–4.4)
Albumin/Glob SerPl: 1.1 (ref 0.7–1.7)
Alpha 1: 0.2 g/dL (ref 0.0–0.4)
Alpha2 Glob SerPl Elph-Mcnc: 0.7 g/dL (ref 0.4–1.0)
B-Globulin SerPl Elph-Mcnc: 0.9 g/dL (ref 0.7–1.3)
Gamma Glob SerPl Elph-Mcnc: 1.7 g/dL (ref 0.4–1.8)
Globulin, Total: 3.5 g/dL (ref 2.2–3.9)
IgA: 349 mg/dL (ref 61–437)
IgG (Immunoglobin G), Serum: 1725 mg/dL — ABNORMAL HIGH (ref 603–1613)
IgM (Immunoglobulin M), Srm: 50 mg/dL (ref 15–143)
Total Protein ELP: 7.1 g/dL (ref 6.0–8.5)

## 2023-01-11 DIAGNOSIS — E119 Type 2 diabetes mellitus without complications: Secondary | ICD-10-CM | POA: Diagnosis not present

## 2023-01-11 DIAGNOSIS — Z794 Long term (current) use of insulin: Secondary | ICD-10-CM | POA: Diagnosis not present

## 2023-01-15 DIAGNOSIS — Z794 Long term (current) use of insulin: Secondary | ICD-10-CM | POA: Diagnosis not present

## 2023-01-15 DIAGNOSIS — E119 Type 2 diabetes mellitus without complications: Secondary | ICD-10-CM | POA: Diagnosis not present

## 2023-01-16 ENCOUNTER — Other Ambulatory Visit: Payer: Self-pay | Admitting: Critical Care Medicine

## 2023-02-06 DIAGNOSIS — E119 Type 2 diabetes mellitus without complications: Secondary | ICD-10-CM | POA: Diagnosis not present

## 2023-02-06 DIAGNOSIS — Z794 Long term (current) use of insulin: Secondary | ICD-10-CM | POA: Diagnosis not present

## 2023-02-10 DIAGNOSIS — E119 Type 2 diabetes mellitus without complications: Secondary | ICD-10-CM | POA: Diagnosis not present

## 2023-02-10 DIAGNOSIS — Z794 Long term (current) use of insulin: Secondary | ICD-10-CM | POA: Diagnosis not present

## 2023-02-17 DIAGNOSIS — E119 Type 2 diabetes mellitus without complications: Secondary | ICD-10-CM | POA: Diagnosis not present

## 2023-02-17 DIAGNOSIS — Z794 Long term (current) use of insulin: Secondary | ICD-10-CM | POA: Diagnosis not present

## 2023-03-02 DIAGNOSIS — C61 Malignant neoplasm of prostate: Secondary | ICD-10-CM | POA: Diagnosis not present

## 2023-03-03 ENCOUNTER — Ambulatory Visit: Payer: Medicare PPO | Attending: Family Medicine | Admitting: Family Medicine

## 2023-03-03 ENCOUNTER — Encounter: Payer: Self-pay | Admitting: Family Medicine

## 2023-03-03 VITALS — BP 155/80 | HR 66 | Ht 68.0 in | Wt 177.4 lb

## 2023-03-03 DIAGNOSIS — E1122 Type 2 diabetes mellitus with diabetic chronic kidney disease: Secondary | ICD-10-CM

## 2023-03-03 DIAGNOSIS — Z7984 Long term (current) use of oral hypoglycemic drugs: Secondary | ICD-10-CM

## 2023-03-03 DIAGNOSIS — I2510 Atherosclerotic heart disease of native coronary artery without angina pectoris: Secondary | ICD-10-CM | POA: Diagnosis not present

## 2023-03-03 DIAGNOSIS — E1149 Type 2 diabetes mellitus with other diabetic neurological complication: Secondary | ICD-10-CM | POA: Diagnosis not present

## 2023-03-03 DIAGNOSIS — N183 Type 2 diabetes mellitus with diabetic chronic kidney disease: Secondary | ICD-10-CM

## 2023-03-03 DIAGNOSIS — I739 Peripheral vascular disease, unspecified: Secondary | ICD-10-CM | POA: Diagnosis not present

## 2023-03-03 DIAGNOSIS — I129 Hypertensive chronic kidney disease with stage 1 through stage 4 chronic kidney disease, or unspecified chronic kidney disease: Secondary | ICD-10-CM

## 2023-03-03 DIAGNOSIS — Z794 Long term (current) use of insulin: Secondary | ICD-10-CM | POA: Diagnosis not present

## 2023-03-03 DIAGNOSIS — N1831 Chronic kidney disease, stage 3a: Secondary | ICD-10-CM | POA: Diagnosis not present

## 2023-03-03 LAB — POCT GLYCOSYLATED HEMOGLOBIN (HGB A1C): HbA1c, POC (controlled diabetic range): 7.5 % — AB (ref 0.0–7.0)

## 2023-03-03 MED ORDER — DULOXETINE HCL 60 MG PO CPEP: 60.0000 mg | ORAL_CAPSULE | Freq: Every day | ORAL | 1 refills | Status: DC

## 2023-03-03 NOTE — Progress Notes (Unsigned)
 Subjective:  Patient ID: Douglas Pippins., male    DOB: March 23, 1945  Age: 78 y.o. MRN: [id number]  CC: Medical Management of Chronic Issues (Numbness in feet/)   HPI Douglas Swatek. is a 78 y.o. year old male with a history of CAD (s/p stent placement), HTN, Type 2 DM (A1c 6.7), Multiple Myeloma in remission, history of PE (completed anticoagulation with Eliquis), PAD  Interval History: Discussed the use of AI scribe software for clinical note transcription with the patient, who gave verbal consent to proceed.  History of Present Illness  The patient, with a history of diabetes, hypertension, and kidney issues, presents with chronic numbness in his feet, which he describes as neuropathy. The numbness has been ongoing for about four to five years. The patient reports that the sensation is not always bothersome, but he is always aware of it. He describes it as feeling like he is wearing socks even when he is not. The patient has been taking gabapentin for this issue for several years, but reports that it has not been effective in relieving the numbness. However, he notes that the gabapentin seems to help him sleep.  In addition to the neuropathy, the patient's blood pressure is elevated, and his A1c has increased since his last visit from 6.9-7.5. The patient reports that he has been monitoring his blood sugar levels at home, which typically range from 70 to 125. He also mentions that he is on a diabetes maintenance plan, which involves daily check-ins from his insurance company.  The patient also mentions seeing an oncologist for monitoring of his multiple myeloma which is in remission and a kidney specialist for his chronic kidney disease. He also had a visit with the vascular surgeon due to PAD but denies claudication pain.  Past Medical History:  Diagnosis Date   Arterial insufficiency of lower extremity (HCC) 09/01/2016   Overview:  Mild (2018)  Last Assessment & Plan:  Mild arterial  insufficiency, he does not have classic symptoms of claudication. Advised daily foot checks and would consider further evaluation if new or worsening symptoms  Mild (2018)  Last Assessment & Plan:  asymptomatic.  We will follow-up as needed Formatting of this note might be different from the original. Mild (2018)  Last Assessment & Pla   Benign prostatic hyperplasia 05/02/2020   CAD (coronary artery disease) 2006   stent LAD 3.0x18 [place], occluded RCA   Cerumen impaction 10/21/2021   Last Assessment & Plan: Formatting of this note might be different from the original.  Right cerumen impaction cleared with irrigation.  Left with yellow appearing discharge,  patient reports he also recently been flushing it at home. Patient denies any pain.  Concern for immunocompromised, chronic otorrhea, will rx ofloxacin drops per formualry   Cervical lymphadenopathy 03/31/2016   Colitis 05/12/2016   Elevated PSA 01/24/2015   Last Assessment & Plan: Formatting of this note might be different from the original.  Reports he has a pending biopsy which will be rescheduled due to recent DVT.  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  Patient is up-to-date on Covid booster  Infected tooth 03/31/2016   Monoclonal gammopathy    Multiple myeloma in remission (HCC) 07/22/2016   Last Assessment & Plan:  Formatting of this note might be different from the original.  Patient remains in remission, followed by oncology.  Had labs drawn today   Peripheral neuropathy 07/22/2016   Last Assessment & Plan:  Overall improved. Patient has  discontinued gabapentin, did not feel much difference  Feet and fingers.  Multifactorial, DM, Chemo, MM  Last Assessment & Plan:   Doing well on gabapentin Formatting of this note might be different from the original. Last Assessment & Plan:  Overall improved. Patient has discontinued gabapentin, did not feel much difference   Screening for malignant neoplasm of colon 05/02/2020   Stage 3 chronic kidney disease (HCC) 06/13/2016   Last Assessment & Plan:  Will check Cr/K today  June 2020, annual follow-up [place] [facility]  Last Assessment & Plan:   Recent labs reviewed- stable Formatting of this note might be different from the original. Last Assessment & Plan:  Will check Cr/K today   Type 2 diabetes mellitus (HCC) 07/21/2011   Last Assessment & Plan:   Diabetes improved with fasting glucoses in a safe range.  Continue current regimen. Formatting of this note might be different from the original. Last Assessment & Plan:  Patient's diabetes has been well controlled at baseline on 12 units of Lantus daily.  He reports some hyperglycemia on the first few days after dexamethasone with resolution by the end of the week. 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 Patient has discontinued gabapentin, did not feel much difference     Feet and fingers.  Multifactorial, DM, Chemo, MM     Last Assessment & Plan:    Doing well on gabapentin  Formatting of this note might be different from the original.  Last Assessment & Plan:   Overall improved. Patient has discontinued gabapentin, did not feel much difference     For   Vitamin D deficiency 05/02/2020    Past Surgical History:  Procedure Laterality Date   CARDIAC CATHETERIZATION  2006   CORONARY ANGIOPLASTY WITH STENT PLACEMENT  2007   IR FLUORO GUIDE CV LINE RIGHT  05/20/2016   IR US GUIDE VASC ACCESS RIGHT  05/20/2016    Family History  Problem Relation Age of Onset    Arrhythmia Mother    Heart disease Mother    Diabetes Sister    Diabetes Brother    Cancer Brother    Stroke Father    Diabetes Brother     Social History   Socioeconomic History   Marital status: Married    Spouse name: Not on file   Number of children: 6   Years of education: Not on file   Highest education level: Not on file  Occupational History   Occupation: retired Optician, dispensing  Tobacco Use   Smoking status: Never   Smokeless tobacco: Never  Vaping Use   Vaping status: Never Used  Substance and Sexual Activity   Alcohol use: No   Drug use: No   Sexual activity: Not Currently  Other Topics Concern   Not on file  Social History Narrative   Not on file   Social Drivers of Health   Financial Resource Strain: Low Risk  (07/30/2022)   Overall Financial Resource Strain (CARDIA)    Difficulty of Paying Living Expenses: Not hard at all  Food  Insecurity: No Food Insecurity (07/30/2022)   Hunger Vital Sign    Worried About Running Out of Food in the Last Year: Never true    Ran Out of Food in the Last Year: Never true  Transportation Needs: No Transportation Needs (07/30/2022)   PRAPARE - Administrator, Civil Service (Medical): No    Lack of Transportation (Non-Medical): No  Physical Activity: Insufficiently Active (07/30/2022)   Exercise Vital Sign    Days of Exercise per Week: 3 days    Minutes of Exercise per Session: 30 min  Stress: No Stress Concern Present (07/30/2022)   [employer] of Occupational Health - Occupational Stress Questionnaire    Feeling of Stress : Not at all  Social Connections: Moderately Integrated (07/30/2022)   Social Connection and Isolation Panel [NHANES]    Frequency of Communication with Friends and Family: More than three times a week    Frequency of Social Gatherings with Friends and Family: Three times a week    Attends Religious Services: More than 4 times per year    Active Member of Clubs or Organizations: No    Attends  Banker Meetings: Never    Marital Status: Married    No Known Allergies  Outpatient Medications Prior to Visit  Medication Sig Dispense Refill   amLODipine (NORVASC) 10 MG tablet TAKE 1 TABLET EVERY DAY 90 tablet 0   aspirin EC 81 MG tablet Take 1 tablet (81 mg total) by mouth daily. Swallow whole. 90 tablet 3   atorvastatin (LIPITOR) 20 MG tablet Take 1 tablet (20 mg total) by mouth daily. 90 tablet 3   Cholecalciferol 10 MCG (400 UNIT) CAPS Take 1 capsule by mouth daily.     dapagliflozin propanediol (FARXIGA) 10 MG TABS tablet Take 1 tablet (10 mg total) by mouth daily before breakfast. 90 tablet 1   LANTUS SOLOSTAR 100 UNIT/ML Solostar Pen Inject 20 Units into the skin daily. 15 mL 6   lisinopril (ZESTRIL) 2.5 MG tablet Take 1 tablet (2.5 mg total) by mouth daily. 90 tablet 2   metoprolol succinate (TOPROL-XL) 100 MG 24 hr tablet Take 1 tablet (100 mg total) by mouth daily. 90 tablet 2   nitroGLYCERIN (NITROSTAT) 0.4 MG SL tablet Place 1 tablet (0.4 mg total) under the tongue every 5 (five) minutes x 3 doses as needed for chest pain. 30 tablet 12   vitamin B-12 (CYANOCOBALAMIN) 1000 MCG tablet Take 1,000 mcg by mouth daily.     gabapentin (NEURONTIN) 300 MG capsule Take 1 capsule (300 mg total) by mouth daily. 90 capsule 1   amoxicillin (AMOXIL) 250 MG capsule Once before dental procedures (Patient not taking: Reported on 03/03/2023) 5 capsule 0   No facility-administered medications prior to visit.     ROS Review of Systems  Constitutional:  Negative for activity change and appetite change.  HENT:  Negative for sinus pressure and sore throat.   Respiratory:  Negative for chest tightness, shortness of breath and wheezing.   Cardiovascular:  Negative for chest pain and palpitations.  Gastrointestinal:  Negative for abdominal distention, abdominal pain and constipation.  Genitourinary: Negative.   Musculoskeletal: Negative.   Neurological:  Positive for numbness.   Psychiatric/Behavioral:  Negative for behavioral problems and dysphoric mood.     Objective:  BP (!) 155/80   Pulse 66   Ht 5\' 8"  (1.727 m)   Wt 177 lb 6.4 oz (80.5 kg)   SpO2 100%   BMI 26.97 kg/m  03/03/2023    3:30 PM 03/03/2023    2:30 PM 12/24/2022    1:15 PM  BP/Weight  Systolic BP 155 155 161  Diastolic BP 80 72 89  Wt. (Lbs)  177.4 176.1  BMI  26.97 kg/m2 26.78 kg/m2      Physical Exam Constitutional:      Appearance: He is well-developed.  Cardiovascular:     Rate and Rhythm: Normal rate.     Heart sounds: Normal heart sounds. No murmur heard. Pulmonary:     Effort: Pulmonary effort is normal.     Breath sounds: Normal breath sounds. No wheezing or rales.  Chest:     Chest wall: No tenderness.  Abdominal:     General: Bowel sounds are normal. There is no distension.     Palpations: Abdomen is soft. There is no mass.     Tenderness: There is no abdominal tenderness.  Musculoskeletal:        General: Normal range of motion.     Right lower leg: No edema.     Left lower leg: No edema.  Neurological:     Mental Status: He is alert and oriented to person, place, and time.  Psychiatric:        Mood and Affect: Mood normal.    Diabetic Foot Exam - Simple   Simple Foot Form Visual Inspection No deformities, no ulcerations, no other skin breakdown bilaterally: Yes Sensation Testing Intact to touch and monofilament testing bilaterally: Yes Pulse Check See comments: Yes Comments Non palpable right foot pulses Left posterior tibialis pulse palpable, DP non palpable         Latest Ref Rng & Units 12/17/2022    1:03 PM 09/16/2022    3:11 PM 06/03/2022   12:14 PM  CMP  Glucose 70 - 99 mg/dL 161  096  045   BUN 8 - 23 mg/dL 20  19  23    Creatinine 0.61 - 1.24 mg/dL 4.09  8.11  9.14   Sodium 135 - 145 mmol/L 140  138  138   Potassium 3.5 - 5.1 mmol/L 3.5  3.9  3.9   Chloride 98 - 111 mmol/L 108  105  105   CO2 22 - 32 mmol/L 27  29  27     Calcium 8.9 - 10.3 mg/dL 9.0  8.8  8.6   Total Protein 6.5 - 8.1 g/dL 7.8  7.4  7.1   Total Bilirubin <1.2 mg/dL 0.4  0.5  0.4   Alkaline Phos 38 - 126 U/L 75  67  80   AST 15 - 41 U/L 16  14  16    ALT 0 - 44 U/L 18  21  20      Lipid Panel     Component Value Date/Time   CHOL 137 07/03/2022 1227   TRIG 128 07/03/2022 1227   HDL 54 07/03/2022 1227   CHOLHDL 2.5 07/03/2022 1227   CHOLHDL 4 01/11/2013 1030   VLDL 16.8 01/11/2013 1030   LDLCALC 61 07/03/2022 1227    CBC    Component Value Date/Time   WBC 3.7 (L) 12/17/2022 1303   WBC 4.0 05/23/2022 1110   RBC 5.00 12/17/2022 1303   HGB 14.2 12/17/2022 1303   HGB 12.3 (L) 01/08/2017 1338   HCT 43.3 12/17/2022 1303   HCT 37.7 (L) 01/08/2017 1338   PLT 172 12/17/2022 1303   PLT 174 01/08/2017 1338   MCV 86.6 12/17/2022 1303   MCV 85.5 01/08/2017 1338   MCH 28.4  12/17/2022 1303   MCHC 32.8 12/17/2022 1303   RDW 16.6 (H) 12/17/2022 1303   RDW 15.7 (H) 01/08/2017 1338   LYMPHSABS 1.1 12/17/2022 1303   LYMPHSABS 0.9 01/08/2017 1338   MONOABS 0.5 12/17/2022 1303   MONOABS 0.8 01/08/2017 1338   EOSABS 0.1 12/17/2022 1303   EOSABS 0.5 01/08/2017 1338   BASOSABS 0.0 12/17/2022 1303   BASOSABS 0.0 01/08/2017 1338    Lab Results  Component Value Date   HGBA1C 7.5 (A) 03/03/2023    ABI from 12/2022 Summary:  Right: Resting right ankle-brachial index indicates moderate right lower  extremity arterial disease. The right toe-brachial index is abnormal.   Left: Resting left ankle-brachial index indicates moderate left lower  extremity arterial disease. The left toe-brachial index is abnormal.   Assessment & Plan:   Assessment & Plan Peripheral diabetic neuropathy Chronic numbness in feet, described as neuropathy-like symptoms. Gabapentin 300mg  nightly has not been effective for symptom control, but aids in sleep. Discussed options of increasing Gabapentin dose or switching to Cymbalta. -Discontinue Gabapentin and start  Cymbalta (duloxetine) after shared decision making.  Hypertension Blood pressure elevated at current visit. Patient reported taking antihypertensive medication prior to visit. -Schedule follow-up in 1 month to recheck blood pressure and adjust medication regimen if necessary.  Type 2 Diabetes Mellitus Recent HbA1c of 7.5, increased from previous 6.7. Home blood glucose readings vary, with morning readings around 97 and evening readings around 125. Patient is on Lantus 20 units and Farxiga 10mg . -Advise patient to monitor diet and continue current medication regimen. -Schedule follow-up in 3 months to monitor HbA1c. -Counseled on Diabetic diet, my plate method, 782 minutes of moderate intensity exercise/week Blood sugar logs with fasting goals of 80-120 mg/dl, random of less than 956 and in the event of sugars less than 60 mg/dl or greater than 213 mg/dl encouraged to notify the clinic. Advised on the need for annual eye exams, annual foot exams, Pneumonia vaccine.   Hyperlipidemia Patient is on Atorvastatin. Last cholesterol check in June 2024 was normal. -Continue Atorvastatin.  Chronic Kidney Disease stage 3 Slight abnormality in kidney function noted in previous labs. Patient is under the care of a nephrologist. -Continue current management under nephrologist.   CAD Status post stent placement -Secondary risk factor modification -Continue statin -Continue to follow-up with cardiology  PAD -No claudication pain -Secondary risk factor modification -Continue to follow-up with vascular   General Health Maintenance / Followup Plans -Return in 1 month for blood pressure check and potential medication adjustment. -Return in 3 months for HbA1c check. -Continue follow-up with specialists as scheduled.      Meds ordered this encounter  Medications   DISCONTD: DULoxetine (CYMBALTA) 60 MG capsule    Sig: Take 1 capsule (60 mg total) by mouth daily. For neuropathy    Dispense:   90 capsule    Refill:  1   DULoxetine (CYMBALTA) 60 MG capsule    Sig: Take 1 capsule (60 mg total) by mouth daily. For neuropathy    Dispense:  90 capsule    Refill:  1    Discontinue gabapentin    Follow-up: Return in about 1 month (around 03/31/2023) for Blood Pressure follow-up with PCP.       Hoy Register, MD, FAAFP. [facility] [place], [place] [phone]   03/04/2023, 1:20 PM

## 2023-03-03 NOTE — Patient Instructions (Signed)
 VISIT SUMMARY:  During today's visit, we discussed your ongoing health issues, including chronic numbness in your feet, elevated blood pressure, and increased HbA1c levels. We reviewed your current medications and made some adjustments to better manage your symptoms and overall health.  YOUR PLAN:  -PERIPHERAL NEUROPATHY: Peripheral neuropathy is a condition that results in numbness, tingling, or pain in the extremities, often due to nerve damage. We will discontinue Gabapentin and start you on Cymbalta (duloxetine) to see if it provides better symptom relief.  -HYPERTENSION: Hypertension, or high blood pressure, can lead to serious health issues if not managed properly. Your blood pressure was elevated today, so we will recheck it in one month and adjust your medication if needed.  -TYPE 2 DIABETES MELLITUS: Type 2 diabetes is a condition where your body does not use insulin properly, leading to high blood sugar levels. Your recent HbA1c has increased, so please monitor your diet and continue your current medications. We will recheck your HbA1c in three months.  -HYPERLIPIDEMIA: Hyperlipidemia is a condition with high levels of fats (lipids) in the blood. Your cholesterol levels were normal in June 2024, so continue taking Atorvastatin as prescribed.  -CHRONIC KIDNEY DISEASE: Chronic kidney disease involves gradual loss of kidney function. Continue following the management plan set by your nephrologist.  INSTRUCTIONS:  Please return in 1 month for a blood pressure check and potential medication adjustment. Additionally, return in 3 months for an HbA1c check. Continue your follow-up appointments with your specialists as scheduled.

## 2023-03-04 ENCOUNTER — Encounter: Payer: Self-pay | Admitting: Family Medicine

## 2023-03-16 ENCOUNTER — Other Ambulatory Visit: Payer: Self-pay | Admitting: *Deleted

## 2023-03-16 DIAGNOSIS — C9001 Multiple myeloma in remission: Secondary | ICD-10-CM

## 2023-03-17 ENCOUNTER — Inpatient Hospital Stay: Payer: Medicare PPO | Attending: Hematology

## 2023-03-17 DIAGNOSIS — N183 Chronic kidney disease, stage 3 unspecified: Secondary | ICD-10-CM | POA: Insufficient documentation

## 2023-03-17 DIAGNOSIS — Z86711 Personal history of pulmonary embolism: Secondary | ICD-10-CM | POA: Insufficient documentation

## 2023-03-17 DIAGNOSIS — C61 Malignant neoplasm of prostate: Secondary | ICD-10-CM | POA: Insufficient documentation

## 2023-03-17 DIAGNOSIS — L989 Disorder of the skin and subcutaneous tissue, unspecified: Secondary | ICD-10-CM | POA: Insufficient documentation

## 2023-03-17 DIAGNOSIS — Z79899 Other long term (current) drug therapy: Secondary | ICD-10-CM | POA: Insufficient documentation

## 2023-03-17 DIAGNOSIS — E1122 Type 2 diabetes mellitus with diabetic chronic kidney disease: Secondary | ICD-10-CM | POA: Insufficient documentation

## 2023-03-17 DIAGNOSIS — C9001 Multiple myeloma in remission: Secondary | ICD-10-CM | POA: Insufficient documentation

## 2023-03-17 DIAGNOSIS — E1142 Type 2 diabetes mellitus with diabetic polyneuropathy: Secondary | ICD-10-CM | POA: Insufficient documentation

## 2023-03-17 DIAGNOSIS — Z7982 Long term (current) use of aspirin: Secondary | ICD-10-CM | POA: Diagnosis not present

## 2023-03-17 DIAGNOSIS — Z794 Long term (current) use of insulin: Secondary | ICD-10-CM | POA: Diagnosis not present

## 2023-03-17 DIAGNOSIS — E559 Vitamin D deficiency, unspecified: Secondary | ICD-10-CM | POA: Diagnosis not present

## 2023-03-17 DIAGNOSIS — D649 Anemia, unspecified: Secondary | ICD-10-CM | POA: Diagnosis not present

## 2023-03-17 LAB — CBC WITH DIFFERENTIAL (CANCER CENTER ONLY)
Abs Immature Granulocytes: 0.01 10*3/uL (ref 0.00–0.07)
Basophils Absolute: 0 10*3/uL (ref 0.0–0.1)
Basophils Relative: 0 %
Eosinophils Absolute: 0 10*3/uL (ref 0.0–0.5)
Eosinophils Relative: 1 %
HCT: 46.8 % (ref 39.0–52.0)
Hemoglobin: 15.2 g/dL (ref 13.0–17.0)
Immature Granulocytes: 0 %
Lymphocytes Relative: 31 %
Lymphs Abs: 1.1 10*3/uL (ref 0.7–4.0)
MCH: 27.6 pg (ref 26.0–34.0)
MCHC: 32.5 g/dL (ref 30.0–36.0)
MCV: 84.9 fL (ref 80.0–100.0)
Monocytes Absolute: 0.6 10*3/uL (ref 0.1–1.0)
Monocytes Relative: 15 %
Neutro Abs: 1.9 10*3/uL (ref 1.7–7.7)
Neutrophils Relative %: 53 %
Platelet Count: 196 10*3/uL (ref 150–400)
RBC: 5.51 MIL/uL (ref 4.22–5.81)
RDW: 17 % — ABNORMAL HIGH (ref 11.5–15.5)
WBC Count: 3.6 10*3/uL — ABNORMAL LOW (ref 4.0–10.5)
nRBC: 0 % (ref 0.0–0.2)

## 2023-03-17 LAB — CMP (CANCER CENTER ONLY)
ALT: 16 U/L (ref 0–44)
AST: 15 U/L (ref 15–41)
Albumin: 4.1 g/dL (ref 3.5–5.0)
Alkaline Phosphatase: 73 U/L (ref 38–126)
Anion gap: 5 (ref 5–15)
BUN: 30 mg/dL — ABNORMAL HIGH (ref 8–23)
CO2: 27 mmol/L (ref 22–32)
Calcium: 8.8 mg/dL — ABNORMAL LOW (ref 8.9–10.3)
Chloride: 105 mmol/L (ref 98–111)
Creatinine: 1.63 mg/dL — ABNORMAL HIGH (ref 0.61–1.24)
GFR, Estimated: 43 mL/min — ABNORMAL LOW (ref >60)
Glucose, Bld: 99 mg/dL (ref 70–99)
Potassium: 4.2 mmol/L (ref 3.5–5.1)
Sodium: 137 mmol/L (ref 135–145)
Total Bilirubin: 0.6 mg/dL (ref 0.0–1.2)
Total Protein: 7.8 g/dL (ref 6.5–8.1)

## 2023-03-18 LAB — KAPPA/LAMBDA LIGHT CHAINS
Kappa free light chain: 34.2 mg/L — ABNORMAL HIGH (ref 3.3–19.4)
Kappa, lambda light chain ratio: 1.57 (ref 0.26–1.65)
Lambda free light chains: 21.8 mg/L (ref 5.7–26.3)

## 2023-03-19 LAB — MULTIPLE MYELOMA PANEL, SERUM
Albumin SerPl Elph-Mcnc: 3.5 g/dL (ref 2.9–4.4)
Albumin/Glob SerPl: 1 (ref 0.7–1.7)
Alpha 1: 0.2 g/dL (ref 0.0–0.4)
Alpha2 Glob SerPl Elph-Mcnc: 0.8 g/dL (ref 0.4–1.0)
B-Globulin SerPl Elph-Mcnc: 1 g/dL (ref 0.7–1.3)
Gamma Glob SerPl Elph-Mcnc: 1.8 g/dL (ref 0.4–1.8)
Globulin, Total: 3.8 g/dL (ref 2.2–3.9)
IgA: 405 mg/dL (ref 61–437)
IgG (Immunoglobin G), Serum: 1942 mg/dL — ABNORMAL HIGH (ref 603–1613)
IgM (Immunoglobulin M), Srm: 63 mg/dL (ref 15–143)
Total Protein ELP: 7.3 g/dL (ref 6.0–8.5)

## 2023-03-25 ENCOUNTER — Inpatient Hospital Stay (HOSPITAL_BASED_OUTPATIENT_CLINIC_OR_DEPARTMENT_OTHER): Payer: Medicare PPO | Admitting: Hematology

## 2023-03-25 DIAGNOSIS — C9001 Multiple myeloma in remission: Secondary | ICD-10-CM

## 2023-03-25 NOTE — Progress Notes (Signed)
 [name]  HEMATOLOGY ONCOLOGY PHONE VISIT NOTE  Date of service: 03/25/23   Patient Care Team: Hoy Register, MD as PCP - General (Family Medicine) Johney Maine, MD as Consulting Physician (Hematology) Illene Labrador, OD (Optometry)  Chief Complaint : F/u for continued evaluation management of multiple myeloma  Diagnosis:  IgG Kappa multiple myeloma   Current Treatment: Surveillance   INTERVAL HISTORY:  [name]. Is A 78 y.o. male who is being connected with via telemedicine visit for continued evaluation and management of his multiple myeloma.   Patient was last seen by me on 12/24/2022 and reported recent diagnosis of low-risk prostate cancer based on biopsy. He reported very mild leg cramping leg when lying down and mild nodular swelling with certain sleeping positions.  I connected with [name]. on 03/25/2023 at  3:30 PM EDT by telephone visit and verified that I am speaking with the correct person using two identifiers.   I discussed the limitations, risks, security and privacy concerns of performing an evaluation and management service by telemedicine and the availability of in-person appointments. I also discussed with the patient that there may be a patient responsible charge related to this service. The patient expressed understanding and agreed to proceed.   Other persons participating in the visit and their role in the encounter: his wife   Patient's location: home  Provider's location: [facility]   Chief Complaint: evaluation and management of his multiple myeloma    Today, he reports that he received treatment for his prostate cancer two weeks ago, which he has been tolerating well.   His PET scan from 11/17/2022 showed Two foci of intense radiotracer activity in the prostate gland which were biopsied. Patient has not received any specific localised treatment or hormonal treatment for these two spots.   He reports that he is no longer taking  gabapentin. He continues to take Cymbalta to manage his neuropathy. He continues to take B12 and aspirin.   He wiil see his PA in June 2025.   Patient does plan to travel  to [place] in July 2025.   REVIEW OF SYSTEMS:    10 Point review of Systems was done is negative except as noted above.   Past Medical History:  Diagnosis Date   Arterial insufficiency of lower extremity (HCC) 09/01/2016   Overview:  Mild (2018)  Last Assessment & Plan:  Mild arterial insufficiency, he does not have classic symptoms of claudication. Advised daily foot checks and would consider further evaluation if new or worsening symptoms  Mild (2018)  Last Assessment & Plan:  asymptomatic.  We will follow-up as needed Formatting of this note might be different from the original. Mild (2018)  Last Assessment & Pla   Benign prostatic hyperplasia 05/02/2020   CAD (coronary artery disease) 2006   stent LAD 3.0x18 Cypher, occluded RCA   Cerumen impaction 10/21/2021   Last Assessment & Plan: Formatting of this note might be different from the original.  Right cerumen impaction cleared with irrigation.  Left with yellow appearing discharge,  patient reports he also recently been flushing it at home. Patient denies any pain.  Concern for immunocompromised, chronic otorrhea, will rx ofloxacin drops per formualry   Cervical lymphadenopathy 03/31/2016   Colitis 05/12/2016   Elevated PSA 01/24/2015   Last Assessment & Plan: Formatting of this note might be different from the original.  Reports he has a pending biopsy which will be rescheduled due to recent DVT.  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  Patient is up-to-date on Covid booster   Infected tooth 03/31/2016   Monoclonal gammopathy    Multiple myeloma in remission (HCC) 07/22/2016   Last Assessment & Plan:  Formatting of this note might be different from the original.  Patient remains in remission, followed by oncology.  Had labs drawn today   Peripheral neuropathy 07/22/2016   Last Assessment & Plan:  Overall improved. Patient has discontinued gabapentin, did not feel much difference  Feet and fingers.  Multifactorial, DM, Chemo, MM  Last Assessment & Plan:   Doing well on gabapentin Formatting of this note might be different from the original. Last Assessment & Plan:  Overall improved. Patient has discontinued gabapentin, did not feel much difference   Screening for malignant neoplasm of colon 05/02/2020   Stage 3 chronic kidney disease (HCC) 06/13/2016   Last Assessment & Plan:  Will check Cr/K today  June 2020, annual follow-up [place] [facility]  Last Assessment & Plan:   Recent labs reviewed- stable Formatting of this note might be different from the original. Last Assessment & Plan:  Will check Cr/K today   Type 2 diabetes mellitus (HCC) 07/21/2011   Last Assessment & Plan:   Diabetes improved with fasting glucoses in a safe range.  Continue current regimen. Formatting of this note might be different from the original. Last Assessment & Plan:  Patient's diabetes has been well controlled at baseline on 12 units of Lantus daily.  He reports some hyperglycemia on the first few days after dexamethasone with resolution by the end of the week. 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 Patient has discontinued gabapentin, did not feel much difference     Feet and fingers.  Multifactorial, DM, Chemo, MM     Last Assessment & Plan:    Doing well on gabapentin  Formatting of this note might be  different from the original.  Last Assessment & Plan:   Overall improved. Patient has discontinued gabapentin, did not feel much difference     For   Vitamin D deficiency 05/02/2020    . Past Surgical History:  Procedure Laterality Date   CARDIAC CATHETERIZATION  2006   CORONARY ANGIOPLASTY WITH STENT PLACEMENT  2007   IR FLUORO GUIDE CV LINE RIGHT  05/20/2016   IR US GUIDE VASC ACCESS RIGHT  05/20/2016    . Social History   Tobacco Use   Smoking status: Never   Smokeless tobacco: Never  Vaping Use   Vaping status: Never Used  Substance Use Topics   Alcohol use: No   Drug use: No    ALLERGIES:  has no known allergies.  MEDICATIONS:  Current Outpatient Medications  Medication Sig Dispense Refill   amLODipine (NORVASC) 10 MG tablet TAKE 1 TABLET EVERY DAY 90 tablet 0   amoxicillin (AMOXIL) 250 MG capsule Once before dental procedures (Patient not taking: Reported on 03/03/2023) 5 capsule 0   aspirin EC 81 MG tablet Take 1 tablet (81 mg total) by mouth daily. Swallow whole. 90  tablet 3   atorvastatin (LIPITOR) 20 MG tablet Take 1 tablet (20 mg total) by mouth daily. 90 tablet 3   Cholecalciferol 10 MCG (400 UNIT) CAPS Take 1 capsule by mouth daily.     dapagliflozin propanediol (FARXIGA) 10 MG TABS tablet Take 1 tablet (10 mg total) by mouth daily before breakfast. 90 tablet 1   DULoxetine (CYMBALTA) 60 MG capsule Take 1 capsule (60 mg total) by mouth daily. For neuropathy 90 capsule 1   LANTUS SOLOSTAR 100 UNIT/ML Solostar Pen Inject 20 Units into the skin daily. 15 mL 6   lisinopril (ZESTRIL) 2.5 MG tablet Take 1 tablet (2.5 mg total) by mouth daily. 90 tablet 2   metoprolol succinate (TOPROL-XL) 100 MG 24 hr tablet Take 1 tablet (100 mg total) by mouth daily. 90 tablet 2   nitroGLYCERIN (NITROSTAT) 0.4 MG SL tablet Place 1 tablet (0.4 mg total) under the tongue every 5 (five) minutes x 3 doses as needed for chest pain. 30 tablet 12   vitamin B-12 (CYANOCOBALAMIN) 1000 MCG  tablet Take 1,000 mcg by mouth daily.     No current facility-administered medications for this visit.    PHYSICAL EXAMINATION: TELEMEDICINE VISIT  LABORATORY DATA:   I have reviewed the data as listed     Latest Ref Rng & Units 03/17/2023    1:39 PM 12/17/2022    1:03 PM 09/16/2022    3:11 PM  CBC  WBC 4.0 - 10.5 K/uL 3.6  3.7  3.8   Hemoglobin 13.0 - 17.0 g/dL 16.1  09.6  04.5   Hematocrit 39.0 - 52.0 % 46.8  43.3  40.7   Platelets 150 - 400 K/uL 196  172  196        Latest Ref Rng & Units 03/17/2023    1:39 PM 12/17/2022    1:03 PM 09/16/2022    3:11 PM  CMP  Glucose 70 - 99 mg/dL 99  409  811   BUN 8 - 23 mg/dL 30  20  19    Creatinine 0.61 - 1.24 mg/dL 9.14  7.82  9.56   Sodium 135 - 145 mmol/L 137  140  138   Potassium 3.5 - 5.1 mmol/L 4.2  3.5  3.9   Chloride 98 - 111 mmol/L 105  108  105   CO2 22 - 32 mmol/L 27  27  29    Calcium 8.9 - 10.3 mg/dL 8.8  9.0  8.8   Total Protein 6.5 - 8.1 g/dL 7.8  7.8  7.4   Total Bilirubin 0.0 - 1.2 mg/dL 0.6  0.4  0.5   Alkaline Phos 38 - 126 U/L 73  75  67   AST 15 - 41 U/L 15  16  14    ALT 0 - 44 U/L 16  18  21         RADIOGRAPHIC STUDIES: I have personally reviewed the radiological images as listed and agreed with the findings in the report. No results found.   ASSESSMENT & PLAN:   78 y.o.  with multiple medical comorbidities but fairly good performance status overall with   1) ISS Stage II IgG K[place] multiple myeloma. Anemia + Renal insufficiency.  Bone survey neg for concerning bone lesions. significantly elevated kappa free light chains 858, lambda 18.4, with a ratio of 46.65 . He was also noted to have an M spike of 1.7 g/dL with IFE showing IgG kappa monoclonal protein. Quantitative IgG level was increased to nearly 2500 mg/dL. 24-hour UPEP  showed monoclonal protein of 1300 mg per 24 hours which constituted about 86% of all the urinary protein. Bone marrow biopsy showed 20-30% Restricted plasma cells consistent  with multiple myeloma Cytogenetics/FISH  Showed +4, +11, and +17   Patient has been treated with CyBord X 2 cycles . Treatment interrupted due to severe c diff colitis with prolonged hospitalization and decline in performance status.  Patient is much improved and back to baseline now. M protein undetected with neg IFE Has been treated with 5 cycles of Rd  Had been on maintenance Revlimid for more than 2 years until PEs in August 2023.  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 Had an element of obstructive uropathy due to BPH which has resolved. Creatinine continues to be stable.   3) Normocytic anemia likely related to multiple myeloma.-stable. No abdominal pain or discomfort over the right upper quadrant. No fevers or chills.  #4 right lower and middle lobe pulmonary embolism August 2023-Revlimid possible provoking factor. currently off Revlimid. 4) DM2 - controlled -continue management per PC   5) Concern for skin cancer lesion on forehead  PLAN:   -Discussed lab results from 03/17/2023 in detail with patient. CBC stable, showed WBC of 3.6K, hemoglobin of 15.2, and platelets of 196K. -CMP shows stable CKD -Calcium levels are normal  -myeloma labs from 03/17/2023 was stable  -Patient has no obvious lab or clinical evidence of myeloma progression at this time.   -no indication for additional treatment for myeloma at this time -continue to follow regularly with urologist, Dr. Annabell Howells, for management of prostate cancer -will continue to monitor with labs every 3-4 months -repeat labs in 4 months  FOLLOW-UP: Labs in 16 weeks RTC with Dr Candise Che in 18 weeks  The total time spent in the appointment was 20 minutes* .  All of the patient's questions were answered with apparent satisfaction. The patient knows to call the clinic with any problems, questions or concerns.   Wyvonnia Lora MD MS AAHIVMS [facility] [place] Hematology/Oncology Physician [facility]  .*Total Encounter Time as defined by the Centers for Medicare and Medicaid Services includes, in addition to the face-to-face time of a patient visit (documented in the note above) non-face-to-face time: obtaining and reviewing outside history, ordering and reviewing medications, tests or procedures, care coordination (communications with other health care professionals or caregivers) and documentation in the medical record.    I,Mitra Faeizi,acting as a Neurosurgeon for Wyvonnia Lora, MD.,have documented all relevant documentation on the behalf of Wyvonnia Lora, MD,as directed by  Wyvonnia Lora, MD while in the presence of Wyvonnia Lora, MD.  .I have reviewed the above documentation for accuracy and completeness, and I agree with the above. Johney Maine MD

## 2023-03-27 ENCOUNTER — Telehealth: Payer: Self-pay | Admitting: Hematology

## 2023-03-27 NOTE — Telephone Encounter (Signed)
 Left patient a vm regarding upcoming appointment

## 2023-04-06 ENCOUNTER — Encounter: Payer: Self-pay | Admitting: Family Medicine

## 2023-04-06 ENCOUNTER — Ambulatory Visit: Payer: Medicare PPO | Attending: Family Medicine | Admitting: Family Medicine

## 2023-04-06 VITALS — BP 171/105 | HR 92 | Resp 16 | Wt 173.0 lb

## 2023-04-06 DIAGNOSIS — M7918 Myalgia, other site: Secondary | ICD-10-CM | POA: Diagnosis not present

## 2023-04-06 DIAGNOSIS — I10 Essential (primary) hypertension: Secondary | ICD-10-CM

## 2023-04-06 DIAGNOSIS — C61 Malignant neoplasm of prostate: Secondary | ICD-10-CM | POA: Diagnosis not present

## 2023-04-06 DIAGNOSIS — R0789 Other chest pain: Secondary | ICD-10-CM

## 2023-04-06 DIAGNOSIS — N401 Enlarged prostate with lower urinary tract symptoms: Secondary | ICD-10-CM

## 2023-04-06 DIAGNOSIS — E1122 Type 2 diabetes mellitus with diabetic chronic kidney disease: Secondary | ICD-10-CM

## 2023-04-06 DIAGNOSIS — M62838 Other muscle spasm: Secondary | ICD-10-CM

## 2023-04-06 MED ORDER — TAMSULOSIN HCL 0.4 MG PO CAPS
0.4000 mg | ORAL_CAPSULE | Freq: Every day | ORAL | 1 refills | Status: DC
Start: 2023-04-06 — End: 2023-10-08

## 2023-04-06 MED ORDER — LISINOPRIL 5 MG PO TABS: 5.0000 mg | ORAL_TABLET | Freq: Every day | ORAL | 1 refills | Status: DC

## 2023-04-06 NOTE — Progress Notes (Signed)
 Subjective:  Patient ID: Douglas Pippins., male    DOB: [birth year]  Age: 78 y.o. MRN: [id number]  CC: No chief complaint on file.     Discussed the use of AI scribe software for clinical note transcription with the patient, who gave verbal consent to proceed.  History of Present Illness The patient, with a history of  CAD (s/p stent placement), HTN, Type 2 DM, Multiple Myeloma in remission, history of PE (completed anticoagulation with Eliquis), PAD , Prostate cancer, BPH presents for a follow-up visit.  He he has had increase in blood pressure readings, with today's reading being 172/107, significantly higher than his usual readings of around 140/67 at home. He also expresses concern about the recent measles outbreak and inquires about his immunity status, given that he had measles as a child and received the vaccine. He declines further testing for immunity.  The patient also reports intermittent back pain on the left side for the past 1.5-2 weeks, which is worse upon standing from a seated position but improves with rest. He denies radiating pain, urinary symptoms, or any other associated symptoms. He also reports presence of nocturia.  He is currently under observation for prostate cancer, with no active treatment.  Lastly, the patient reports a new onset of a burning sensation in his chest, similar to the feeling of a chest cold, which occurs with exertion such as walking long distances or climbing stairs. This symptom has been present for the past week. He denies any upper respiratory symptoms.  Symptoms resolved after the exertion.  He has no symptoms at the moment and is currently under the care of cardiology.    Past Medical History:  Diagnosis Date   Arterial insufficiency of lower extremity (HCC) 09/01/2016   Overview:  Mild (2018)  Last Assessment & Plan:  Mild arterial insufficiency, he does not have classic symptoms of claudication. Advised daily foot checks and would  consider further evaluation if new or worsening symptoms  Mild (2018)  Last Assessment & Plan:  asymptomatic.  We will follow-up as needed Formatting of this note might be different from the original. Mild (2018)  Last Assessment & Pla   Benign prostatic hyperplasia 05/02/2020   CAD (coronary artery disease) 2006   stent LAD 3.0x18 [place], occluded RCA   Cerumen impaction 10/21/2021   Last Assessment & Plan: Formatting of this note might be different from the original.  Right cerumen impaction cleared with irrigation.  Left with yellow appearing discharge,  patient reports he also recently been flushing it at home. Patient denies any pain.  Concern for immunocompromised, chronic otorrhea, will rx ofloxacin drops per formualry   Cervical lymphadenopathy 03/31/2016   Colitis 05/12/2016   Elevated PSA 01/24/2015   Last Assessment & Plan: Formatting of this note might be different from the original.  Reports he has a pending biopsy which will be rescheduled due to recent DVT.  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  Patient is up-to-date on Covid booster   Infected tooth 03/31/2016   Monoclonal gammopathy    Multiple myeloma in remission (HCC) 07/22/2016  Last Assessment & Plan:  Formatting of this note might be different from the original.  Patient remains in remission, followed by oncology.  Had labs drawn today   Peripheral neuropathy 07/22/2016   Last Assessment & Plan:  Overall improved. Patient has discontinued gabapentin, did not feel much difference  Feet and fingers.  Multifactorial, DM, Chemo, MM  Last  Assessment & Plan:   Doing well on gabapentin Formatting of this note might be different from the original. Last Assessment & Plan:  Overall improved. Patient has discontinued gabapentin, did not feel much difference   Screening for malignant neoplasm of colon 05/02/2020   Stage 3 chronic kidney disease (HCC) 06/13/2016   Last Assessment & Plan:  Will check Cr/K today  June 2020, annual follow-up [place] [facility]  Last Assessment & Plan:   Recent labs reviewed- stable Formatting of this note might be different from the original. Last Assessment & Plan:  Will check Cr/K today   Type 2 diabetes mellitus (HCC) 07/21/2011   Last Assessment & Plan:   Diabetes improved with fasting glucoses in a safe range.  Continue current regimen. Formatting of this note might be different from the original. Last Assessment & Plan:  Patient's diabetes has been well controlled at baseline on 12 units of Lantus daily.  He reports some hyperglycemia on the first few days after dexamethasone with resolution by the end of the week. 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 Patient has discontinued gabapentin, did not feel much difference     Feet and fingers.  Multifactorial, DM, Chemo, MM     Last Assessment & Plan:    Doing well on gabapentin  Formatting of this note might be different from the original.  Last Assessment & Plan:   Overall improved. Patient has discontinued gabapentin, did not feel much difference     For   Vitamin D deficiency 05/02/2020    Past Surgical History:  Procedure Laterality Date   CARDIAC CATHETERIZATION  2006   CORONARY ANGIOPLASTY WITH STENT PLACEMENT  2007   IR FLUORO GUIDE CV LINE RIGHT  05/20/2016   IR US GUIDE VASC ACCESS RIGHT  05/20/2016    Family History  Problem Relation Age of Onset   Arrhythmia Mother    Heart disease Mother    Diabetes Sister    Diabetes Brother    Cancer Brother    Stroke  Father    Diabetes Brother     Social History   Socioeconomic History   Marital status: Married    Spouse name: Not on file   Number of children: 6   Years of education: Not on file   Highest education level: Not on file  Occupational History   Occupation: retired Optician, dispensing  Tobacco Use   Smoking status: Never   Smokeless tobacco: Never  Vaping Use   Vaping status: Never Used  Substance and Sexual Activity   Alcohol use: No   Drug use: No   Sexual activity: Not Currently  Other Topics Concern   Not on file  Social History Narrative   Not on file   Social Drivers of Health   Financial Resource Strain: Low Risk  (07/30/2022)   Overall Financial Resource Strain (CARDIA)    Difficulty of Paying Living Expenses: Not hard at all  Food Insecurity: No Food Insecurity (07/30/2022)   Hunger Vital Sign    Worried About Running Out of  Food in the Last Year: Never true    Ran Out of Food in the Last Year: Never true  Transportation Needs: No Transportation Needs (07/30/2022)   PRAPARE - Administrator, Civil Service (Medical): No    Lack of Transportation (Non-Medical): No  Physical Activity: Insufficiently Active (07/30/2022)   Exercise Vital Sign    Days of Exercise per Week: 3 days    Minutes of Exercise per Session: 30 min  Stress: No Stress Concern Present (07/30/2022)   [employer] of Occupational Health - Occupational Stress Questionnaire    Feeling of Stress : Not at all  Social Connections: Moderately Integrated (07/30/2022)   Social Connection and Isolation Panel [NHANES]    Frequency of Communication with Friends and Family: More than three times a week    Frequency of Social Gatherings with Friends and Family: Three times a week    Attends Religious Services: More than 4 times per year    Active Member of Clubs or Organizations: No    Attends Banker Meetings: Never    Marital Status: Married    No Known Allergies  Outpatient  Medications Prior to Visit  Medication Sig Dispense Refill   amLODipine (NORVASC) 10 MG tablet TAKE 1 TABLET EVERY DAY 90 tablet 0   amoxicillin (AMOXIL) 250 MG capsule Once before dental procedures 5 capsule 0   aspirin EC 81 MG tablet Take 1 tablet (81 mg total) by mouth daily. Swallow whole. 90 tablet 3   atorvastatin (LIPITOR) 20 MG tablet Take 1 tablet (20 mg total) by mouth daily. 90 tablet 3   Cholecalciferol 10 MCG (400 UNIT) CAPS Take 1 capsule by mouth daily.     dapagliflozin propanediol (FARXIGA) 10 MG TABS tablet Take 1 tablet (10 mg total) by mouth daily before breakfast. 90 tablet 1   DULoxetine (CYMBALTA) 60 MG capsule Take 1 capsule (60 mg total) by mouth daily. For neuropathy 90 capsule 1   LANTUS SOLOSTAR 100 UNIT/ML Solostar Pen Inject 20 Units into the skin daily. 15 mL 6   metoprolol succinate (TOPROL-XL) 100 MG 24 hr tablet Take 1 tablet (100 mg total) by mouth daily. 90 tablet 2   nitroGLYCERIN (NITROSTAT) 0.4 MG SL tablet Place 1 tablet (0.4 mg total) under the tongue every 5 (five) minutes x 3 doses as needed for chest pain. 30 tablet 12   vitamin B-12 (CYANOCOBALAMIN) 1000 MCG tablet Take 1,000 mcg by mouth daily.     lisinopril (ZESTRIL) 2.5 MG tablet Take 1 tablet (2.5 mg total) by mouth daily. 90 tablet 2   No facility-administered medications prior to visit.     ROS Review of Systems  Constitutional:  Negative for activity change and appetite change.  HENT:  Negative for sinus pressure and sore throat.   Respiratory:  Negative for chest tightness, shortness of breath and wheezing.   Cardiovascular:  Negative for chest pain and palpitations.  Gastrointestinal:  Negative for abdominal distention, abdominal pain and constipation.  Genitourinary: Negative.   Musculoskeletal: Negative.   Psychiatric/Behavioral:  Negative for behavioral problems and dysphoric mood.     Objective:  BP (!) 171/105 (BP Location: Right Arm, Patient Position: Sitting, Cuff Size:  Normal)   Pulse 92   Resp 16   Wt 173 lb (78.5 kg)   SpO2 99%   BMI 26.30 kg/m      04/06/2023    3:12 PM 04/06/2023    2:28 PM 03/03/2023    3:30 PM  BP/Weight  Systolic BP 171 172 155  Diastolic BP 105 107 80  Wt. (Lbs)  173   BMI  26.3 kg/m2       Physical Exam Constitutional:      Appearance: He is well-developed.  Cardiovascular:     Rate and Rhythm: Normal rate.     Heart sounds: Normal heart sounds. No murmur heard. Pulmonary:     Effort: Pulmonary effort is normal.     Breath sounds: Normal breath sounds. No wheezing or rales.  Chest:     Chest wall: No tenderness.  Abdominal:     General: Bowel sounds are normal. There is no distension.     Palpations: Abdomen is soft. There is no mass.     Tenderness: There is no abdominal tenderness.  Musculoskeletal:        General: Normal range of motion.     Right lower leg: No edema.     Left lower leg: No edema.  Neurological:     Mental Status: He is alert and oriented to person, place, and time.  Psychiatric:        Mood and Affect: Mood normal.        Latest Ref Rng & Units 03/17/2023    1:39 PM 12/17/2022    1:03 PM 09/16/2022    3:11 PM  CMP  Glucose 70 - 99 mg/dL 99  161  096   BUN 8 - 23 mg/dL 30  20  19    Creatinine 0.61 - 1.24 mg/dL 0.45  4.09  8.11   Sodium 135 - 145 mmol/L 137  140  138   Potassium 3.5 - 5.1 mmol/L 4.2  3.5  3.9   Chloride 98 - 111 mmol/L 105  108  105   CO2 22 - 32 mmol/L 27  27  29    Calcium 8.9 - 10.3 mg/dL 8.8  9.0  8.8   Total Protein 6.5 - 8.1 g/dL 7.8  7.8  7.4   Total Bilirubin 0.0 - 1.2 mg/dL 0.6  0.4  0.5   Alkaline Phos 38 - 126 U/L 73  75  67   AST 15 - 41 U/L 15  16  14    ALT 0 - 44 U/L 16  18  21      Lipid Panel     Component Value Date/Time   CHOL 137 07/03/2022 1227   TRIG 128 07/03/2022 1227   HDL 54 07/03/2022 1227   CHOLHDL 2.5 07/03/2022 1227   CHOLHDL 4 01/11/2013 1030   VLDL 16.8 01/11/2013 1030   LDLCALC 61 07/03/2022 1227    CBC     Component Value Date/Time   WBC 3.6 (L) 03/17/2023 1339   WBC 4.0 05/23/2022 1110   RBC 5.51 03/17/2023 1339   HGB 15.2 03/17/2023 1339   HGB 12.3 (L) 01/08/2017 1338   HCT 46.8 03/17/2023 1339   HCT 37.7 (L) 01/08/2017 1338   PLT 196 03/17/2023 1339   PLT 174 01/08/2017 1338   MCV 84.9 03/17/2023 1339   MCV 85.5 01/08/2017 1338   MCH 27.6 03/17/2023 1339   MCHC 32.5 03/17/2023 1339   RDW 17.0 (H) 03/17/2023 1339   RDW 15.7 (H) 01/08/2017 1338   LYMPHSABS 1.1 03/17/2023 1339   LYMPHSABS 0.9 01/08/2017 1338   MONOABS 0.6 03/17/2023 1339   MONOABS 0.8 01/08/2017 1338   EOSABS 0.0 03/17/2023 1339   EOSABS 0.5 01/08/2017 1338   BASOSABS 0.0 03/17/2023 1339   BASOSABS 0.0 01/08/2017 1338    Lab  Results  Component Value Date   HGBA1C 7.5 (A) 03/03/2023       Assessment & Plan Exertional Chest Discomfort Recent onset of chest discomfort described as a tingling or stinging sensation with exertion, resolving with rest. Differential includes bronchospasm or cardiac-related issues, but cardiac etiology is less likely given the absence of other symptoms. Discussed inhaler use for exertional symptoms. - Consider inhaler if symptoms persist or worsen  Hypertension Blood pressure elevated at 172/107 mmHg, higher than previous 155/80 mmHg. Home readings typically 140-145/67 mmHg. Target is 130/80 mmHg due to cardiac history. Recent dietary intake may contribute to increase. Decision to adjust medication based on repeat reading. - Recheck blood pressure after sitting - Increase lisinopril from 2.5 mg to 5 mg daily; I am adding Flomax to his regimen hence I will be cautious with increasing his antihypertensive dose -Counseled on blood pressure goal of less than 130/80, low-sodium, DASH diet, medication compliance, 150 minutes of moderate intensity exercise per week. Discussed medication compliance, adverse effects. - Follow up in 3 months  Prostate Cancer/ BPH Diagnosed with prostate  cancer, confirmed by biopsy. Under observation with no active treatment. Reports increased urinary frequency, likely due to prostate enlargement. Flomax discussed to alleviate urinary symptoms without affecting cancer. - Start Flomax to reduce urinary frequency - Continue observation and follow-up with urologist on June 9  Musculoskeletal Back Pain Intermittent left-sided back pain for 1.5 to 2 weeks, primarily upon rising from sitting. No radiation or sciatica signs. Likely due to muscle spasm. - Use heating pad and massage - Consider Lidoderm patch - Perform stretching exercises  General Health Maintenance Inquired about measles immunity due to outbreak concerns. Had measles in childhood and received vaccination. Declined titer test due to insurance coverage uncertainty. - No immediate action as he declined titer test      Meds ordered this encounter  Medications   tamsulosin (FLOMAX) 0.4 MG CAPS capsule    Sig: Take 1 capsule (0.4 mg total) by mouth daily.    Dispense:  90 capsule    Refill:  1   lisinopril (ZESTRIL) 5 MG tablet    Sig: Take 1 tablet (5 mg total) by mouth daily.    Dispense:  90 tablet    Refill:  1    Dose increase    Follow-up: Return in about 3 months (around 07/06/2023) for Chronic medical conditions.       Hoy Register, MD, FAAFP. [facility] [place], [place] [phone]   04/06/2023, 4:42 PM

## 2023-04-06 NOTE — Patient Instructions (Signed)
 VISIT SUMMARY:  During your visit, we discussed several health concerns including your recent increase in blood pressure, chest discomfort with exertion, back pain, and urinary symptoms related to your prostate. We also addressed your concerns about measles immunity.  YOUR PLAN:  -EXERTIONAL CHEST DISCOMFORT: You have been experiencing a burning sensation in your chest during physical activities like walking long distances or climbing stairs. This could be due to bronchospasm or other non-cardiac issues. We discussed the possibility of using an inhaler if these symptoms persist or worsen.  -HYPERTENSION: Your blood pressure was significantly higher today at 172/107 mmHg compared to your usual readings. High blood pressure can increase the risk of heart disease. We will increase your lisinopril dose from 2.5 mg to 5 mg daily and recheck your blood pressure after you have been sitting for a while. Please follow up in 3 months.  -PROSTATE CANCER: You are currently under observation for prostate cancer and have noticed a decrease in nighttime urination. This is likely due to your enlarged prostate. We will start you on Flomax to help reduce urinary frequency. Continue to follow up with your urologist as planned.  -MUSCULOSKELETAL BACK PAIN: You have been experiencing intermittent back pain on your left side, which is likely due to muscle spasm. We recommend using a heating pad, massage, and stretching exercises. You may also consider using a Lidoderm patch for relief.  -GENERAL HEALTH MAINTENANCE: You expressed concern about your immunity to measles due to a recent outbreak. Since you had measles as a child and received the vaccine, no immediate action is needed. You declined further testing for immunity.  INSTRUCTIONS:  Please recheck your blood pressure after sitting for a while and follow up in 3 months. Continue to monitor your symptoms and use the inhaler if your chest discomfort persists or  worsens. Start taking Flomax as prescribed and follow up with your urologist on June 9. Use a heating pad, massage, and stretching exercises for your back pain, and consider using a Lidoderm patch if needed.

## 2023-04-06 NOTE — Progress Notes (Signed)
 F/u HTN Measles  Left lower side discomfort x 1 week

## 2023-04-21 LAB — LAB REPORT - SCANNED: EGFR: 38

## 2023-05-08 ENCOUNTER — Other Ambulatory Visit: Payer: Self-pay | Admitting: Critical Care Medicine

## 2023-05-11 DIAGNOSIS — Z794 Long term (current) use of insulin: Secondary | ICD-10-CM | POA: Diagnosis not present

## 2023-05-11 DIAGNOSIS — E119 Type 2 diabetes mellitus without complications: Secondary | ICD-10-CM | POA: Diagnosis not present

## 2023-05-19 DIAGNOSIS — Z794 Long term (current) use of insulin: Secondary | ICD-10-CM | POA: Diagnosis not present

## 2023-05-19 DIAGNOSIS — E119 Type 2 diabetes mellitus without complications: Secondary | ICD-10-CM | POA: Diagnosis not present

## 2023-06-08 DIAGNOSIS — C61 Malignant neoplasm of prostate: Secondary | ICD-10-CM | POA: Diagnosis not present

## 2023-06-10 DIAGNOSIS — Z794 Long term (current) use of insulin: Secondary | ICD-10-CM | POA: Diagnosis not present

## 2023-06-10 DIAGNOSIS — E119 Type 2 diabetes mellitus without complications: Secondary | ICD-10-CM | POA: Diagnosis not present

## 2023-06-15 DIAGNOSIS — R351 Nocturia: Secondary | ICD-10-CM | POA: Diagnosis not present

## 2023-06-15 DIAGNOSIS — N403 Nodular prostate with lower urinary tract symptoms: Secondary | ICD-10-CM | POA: Diagnosis not present

## 2023-06-15 DIAGNOSIS — C61 Malignant neoplasm of prostate: Secondary | ICD-10-CM | POA: Diagnosis not present

## 2023-06-26 ENCOUNTER — Other Ambulatory Visit: Payer: Self-pay | Admitting: Critical Care Medicine

## 2023-06-30 ENCOUNTER — Other Ambulatory Visit: Payer: Self-pay

## 2023-07-01 LAB — HM DIABETES EYE EXAM

## 2023-07-06 DIAGNOSIS — Z794 Long term (current) use of insulin: Secondary | ICD-10-CM | POA: Diagnosis not present

## 2023-07-06 DIAGNOSIS — E119 Type 2 diabetes mellitus without complications: Secondary | ICD-10-CM | POA: Diagnosis not present

## 2023-07-10 DIAGNOSIS — Z794 Long term (current) use of insulin: Secondary | ICD-10-CM | POA: Diagnosis not present

## 2023-07-10 DIAGNOSIS — E119 Type 2 diabetes mellitus without complications: Secondary | ICD-10-CM | POA: Diagnosis not present

## 2023-07-13 ENCOUNTER — Other Ambulatory Visit: Payer: Self-pay

## 2023-07-13 ENCOUNTER — Telehealth: Payer: Self-pay | Admitting: Hematology

## 2023-07-13 DIAGNOSIS — C9001 Multiple myeloma in remission: Secondary | ICD-10-CM

## 2023-07-13 NOTE — Telephone Encounter (Signed)
 Spoke with patient confirming upcoming appointment changes

## 2023-07-14 ENCOUNTER — Inpatient Hospital Stay: Attending: Hematology

## 2023-07-14 DIAGNOSIS — E1142 Type 2 diabetes mellitus with diabetic polyneuropathy: Secondary | ICD-10-CM | POA: Diagnosis not present

## 2023-07-14 DIAGNOSIS — Z86711 Personal history of pulmonary embolism: Secondary | ICD-10-CM | POA: Insufficient documentation

## 2023-07-14 DIAGNOSIS — E1122 Type 2 diabetes mellitus with diabetic chronic kidney disease: Secondary | ICD-10-CM | POA: Insufficient documentation

## 2023-07-14 DIAGNOSIS — L989 Disorder of the skin and subcutaneous tissue, unspecified: Secondary | ICD-10-CM | POA: Insufficient documentation

## 2023-07-14 DIAGNOSIS — Z79899 Other long term (current) drug therapy: Secondary | ICD-10-CM | POA: Diagnosis not present

## 2023-07-14 DIAGNOSIS — C9001 Multiple myeloma in remission: Secondary | ICD-10-CM | POA: Diagnosis not present

## 2023-07-14 DIAGNOSIS — D649 Anemia, unspecified: Secondary | ICD-10-CM | POA: Insufficient documentation

## 2023-07-14 DIAGNOSIS — E559 Vitamin D deficiency, unspecified: Secondary | ICD-10-CM | POA: Diagnosis not present

## 2023-07-14 DIAGNOSIS — C61 Malignant neoplasm of prostate: Secondary | ICD-10-CM | POA: Diagnosis not present

## 2023-07-14 DIAGNOSIS — Z7982 Long term (current) use of aspirin: Secondary | ICD-10-CM | POA: Diagnosis not present

## 2023-07-14 DIAGNOSIS — N183 Chronic kidney disease, stage 3 unspecified: Secondary | ICD-10-CM | POA: Insufficient documentation

## 2023-07-14 DIAGNOSIS — Z794 Long term (current) use of insulin: Secondary | ICD-10-CM | POA: Diagnosis not present

## 2023-07-14 LAB — CBC WITH DIFFERENTIAL (CANCER CENTER ONLY)
Abs Immature Granulocytes: 0.01 K/uL (ref 0.00–0.07)
Basophils Absolute: 0 K/uL (ref 0.0–0.1)
Basophils Relative: 0 %
Eosinophils Absolute: 0.1 K/uL (ref 0.0–0.5)
Eosinophils Relative: 2 %
HCT: 41.7 % (ref 39.0–52.0)
Hemoglobin: 13.8 g/dL (ref 13.0–17.0)
Immature Granulocytes: 0 %
Lymphocytes Relative: 33 %
Lymphs Abs: 1.4 K/uL (ref 0.7–4.0)
MCH: 28.3 pg (ref 26.0–34.0)
MCHC: 33.1 g/dL (ref 30.0–36.0)
MCV: 85.6 fL (ref 80.0–100.0)
Monocytes Absolute: 0.5 K/uL (ref 0.1–1.0)
Monocytes Relative: 12 %
Neutro Abs: 2.2 K/uL (ref 1.7–7.7)
Neutrophils Relative %: 53 %
Platelet Count: 176 K/uL (ref 150–400)
RBC: 4.87 MIL/uL (ref 4.22–5.81)
RDW: 16.2 % — ABNORMAL HIGH (ref 11.5–15.5)
WBC Count: 4.2 K/uL (ref 4.0–10.5)
nRBC: 0 % (ref 0.0–0.2)

## 2023-07-14 LAB — CMP (CANCER CENTER ONLY)
ALT: 26 U/L (ref 0–44)
AST: 18 U/L (ref 15–41)
Albumin: 3.7 g/dL (ref 3.5–5.0)
Alkaline Phosphatase: 79 U/L (ref 38–126)
Anion gap: 5 (ref 5–15)
BUN: 24 mg/dL — ABNORMAL HIGH (ref 8–23)
CO2: 29 mmol/L (ref 22–32)
Calcium: 8.9 mg/dL (ref 8.9–10.3)
Chloride: 106 mmol/L (ref 98–111)
Creatinine: 1.72 mg/dL — ABNORMAL HIGH (ref 0.61–1.24)
GFR, Estimated: 40 mL/min — ABNORMAL LOW (ref >60)
Glucose, Bld: 101 mg/dL — ABNORMAL HIGH (ref 70–99)
Potassium: 4.1 mmol/L (ref 3.5–5.1)
Sodium: 140 mmol/L (ref 135–145)
Total Bilirubin: 0.4 mg/dL (ref 0.0–1.2)
Total Protein: 7.3 g/dL (ref 6.5–8.1)

## 2023-07-15 LAB — KAPPA/LAMBDA LIGHT CHAINS
Kappa free light chain: 38.2 mg/L — ABNORMAL HIGH (ref 3.3–19.4)
Kappa, lambda light chain ratio: 1.77 — ABNORMAL HIGH (ref 0.26–1.65)
Lambda free light chains: 21.6 mg/L (ref 5.7–26.3)

## 2023-07-20 LAB — MULTIPLE MYELOMA PANEL, SERUM
Albumin SerPl Elph-Mcnc: 3.2 g/dL (ref 2.9–4.4)
Albumin/Glob SerPl: 0.9 (ref 0.7–1.7)
Alpha 1: 0.2 g/dL (ref 0.0–0.4)
Alpha2 Glob SerPl Elph-Mcnc: 0.8 g/dL (ref 0.4–1.0)
B-Globulin SerPl Elph-Mcnc: 1 g/dL (ref 0.7–1.3)
Gamma Glob SerPl Elph-Mcnc: 1.7 g/dL (ref 0.4–1.8)
Globulin, Total: 3.6 g/dL (ref 2.2–3.9)
IgA: 357 mg/dL (ref 61–437)
IgG (Immunoglobin G), Serum: 1791 mg/dL — ABNORMAL HIGH (ref 603–1613)
IgM (Immunoglobulin M), Srm: 61 mg/dL (ref 15–143)
Total Protein ELP: 6.8 g/dL (ref 6.0–8.5)

## 2023-07-23 DIAGNOSIS — E119 Type 2 diabetes mellitus without complications: Secondary | ICD-10-CM | POA: Diagnosis not present

## 2023-07-23 DIAGNOSIS — Z794 Long term (current) use of insulin: Secondary | ICD-10-CM | POA: Diagnosis not present

## 2023-07-27 ENCOUNTER — Other Ambulatory Visit: Payer: Self-pay | Admitting: Family Medicine

## 2023-07-27 NOTE — Progress Notes (Signed)
 [facility] VISIT NOTE  Date of service: 07/28/2023   Patient Care Team: Delbert Clam, MD as PCP - General (Family Medicine) Onesimo Emaline Brink, MD as Consulting Physician (Hematology) Claudene Muskrat, OD (Optometry)  Chief Complaint : F/u for continued evaluation management of multiple myeloma  Diagnosis:  IgG Kappa multiple myeloma   Current Treatment: Surveillance   INTERVAL HISTORY:  Douglas Rose. Is A 78 y.o. male who is here for continued evaluation and management of his multiple myeloma.   I last connected with patient via telemedicine visit on 03/25/2023 and noted taking Cymbalta  to manage his neuropathy.   He reports that he has been doing well overall since his last clinical visit. Patient denies any new concerns since his last clinical visit.   He continues to endorse neuropathy in his feet, which has been fairly stable. He denies any pain in the bones like he did previously.  Patient reports continuing to feel right neck lump, which has been previously biopsied and there is no concerning process. He reports that his lump in the right neck remains unchanged. Patient notes that he may sometimes feel that his neck lump is more prominent depending on the way he lays down.   Patient denies any new medications, new infections, new bone pain, back pain, abdominal pain, or leg swelling.   He reports that he has been off of revlimid  for almost 7-8 months and continues to be in remission.    Patient notes having 6 children and 17 grandchildren.   He reports that he returned from travels to [place], [place]  on 4 days ago.  REVIEW OF SYSTEMS:    10 Point review of Systems was done is negative except as noted above.   Past Medical History:  Diagnosis Date   Arterial insufficiency of lower extremity (HCC) 09/01/2016   Overview:  Mild (2018)  Last Assessment & Plan:  Mild arterial insufficiency, he does not have classic symptoms of claudication.  Advised daily foot checks and would consider further evaluation if new or worsening symptoms  Mild (2018)  Last Assessment & Plan:  asymptomatic.  We will follow-up as needed Formatting of this note might be different from the original. Mild (2018)  Last Assessment & Pla   Benign prostatic hyperplasia 05/02/2020   CAD (coronary artery disease) 2006   stent LAD 3.0x18 Cypher, occluded RCA   Cerumen impaction 10/21/2021   Last Assessment & Plan: Formatting of this note might be different from the original.  Right cerumen impaction cleared with irrigation.  Left with yellow appearing discharge,  patient reports he also recently been flushing it at home. Patient denies any pain.  Concern for immunocompromised, chronic otorrhea, will rx ofloxacin drops per formualry   Cervical lymphadenopathy 03/31/2016   Colitis 05/12/2016   Elevated PSA 01/24/2015   Last Assessment & Plan: Formatting of this note might be different from the original.  Reports he has a pending biopsy which will be rescheduled due to recent DVT.  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  Patient is up-to-date on Covid booster   Infected tooth 03/31/2016  Monoclonal gammopathy    Multiple myeloma in remission (HCC) 07/22/2016   Last Assessment & Plan:  Formatting of this note might be different from the original.  Patient remains in remission, followed by oncology.  Had labs drawn today   Peripheral neuropathy 07/22/2016   Last Assessment & Plan:  Overall improved. Patient has discontinued gabapentin , did not feel much difference  Feet and fingers.   Multifactorial, DM, Chemo, MM  Last Assessment & Plan:   Doing well on gabapentin  Formatting of this note might be different from the original. Last Assessment & Plan:  Overall improved. Patient has discontinued gabapentin , did not feel much difference   Screening for malignant neoplasm of colon 05/02/2020   Stage 3 chronic kidney disease (HCC) 06/13/2016   Last Assessment & Plan:  Will check Cr/K today  June 2020, annual follow-up [facility]  Last Assessment & Plan:   Recent labs reviewed- stable Formatting of this note might be different from the original. Last Assessment & Plan:  Will check Cr/K today   Type 2 diabetes mellitus (HCC) 07/21/2011   Last Assessment & Plan:   Diabetes improved with fasting glucoses in a safe range.  Continue current regimen. Formatting of this note might be different from the original. Last Assessment & Plan:  Patient's diabetes has been well controlled at baseline on 12 units of Lantus  daily.  He reports some hyperglycemia on the first few days after dexamethasone  with resolution by the end of the week. 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 Patient has discontinued gabapentin , did not feel much difference     Feet and fingers.  Multifactorial, DM, Chemo, MM     Last Assessment & Plan:    Doing well on gabapentin   Formatting of this note might be different from the original.  Last Assessment & Plan:   Overall improved. Patient has discontinued gabapentin , did not feel much difference     For   Vitamin D deficiency 05/02/2020    . Past Surgical History:  Procedure Laterality Date   CARDIAC CATHETERIZATION  2006   CORONARY ANGIOPLASTY WITH STENT PLACEMENT  2007   IR FLUORO GUIDE CV LINE RIGHT  05/20/2016   IR US  GUIDE VASC ACCESS RIGHT  05/20/2016    . Social History   Tobacco Use   Smoking status: Never   Smokeless tobacco: Never  Vaping Use   Vaping status: Never Used   Substance Use Topics   Alcohol use: No   Drug use: No    ALLERGIES:  has no known allergies.  MEDICATIONS:  Current Outpatient Medications  Medication Sig Dispense Refill   amLODipine  (NORVASC ) 10 MG tablet TAKE 1 TABLET EVERY DAY 90 tablet 3   aspirin  EC 81 MG tablet Take 1 tablet (81 mg total) by mouth daily. Swallow whole. 90 tablet 3   atorvastatin  (LIPITOR) 20 MG tablet TAKE 1 TABLET EVERY DAY 90 tablet 1   Cholecalciferol 10 MCG (400 UNIT) CAPS Take 1 capsule by mouth daily.     DULoxetine  (CYMBALTA ) 60 MG capsule TAKE 1 CAPSULE EVERY DAY FOR NEUROPATHY 90 capsule 3   LANTUS  SOLOSTAR 100 UNIT/ML Solostar Pen Inject 20 Units into the skin daily. 15 mL 6   lisinopril  (ZESTRIL ) 5 MG tablet Take 1 tablet (5 mg total) by mouth daily. 90 tablet 1   metoprolol  succinate (TOPROL -XL) 100 MG 24 hr tablet Take 1 tablet (100 mg total)  by mouth daily. 90 tablet 2   nitroGLYCERIN  (NITROSTAT ) 0.4 MG SL tablet Place 1 tablet (0.4 mg total) under the tongue every 5 (five) minutes x 3 doses as needed for chest pain. 30 tablet 12   tamsulosin  (FLOMAX ) 0.4 MG CAPS capsule Take 1 capsule (0.4 mg total) by mouth daily. 90 capsule 1   vitamin B-12 (CYANOCOBALAMIN) 1000 MCG tablet Take 1,000 mcg by mouth daily.     amoxicillin  (AMOXIL ) 250 MG capsule Once before dental procedures (Patient not taking: Reported on 07/28/2023) 5 capsule 0   dapagliflozin  propanediol (FARXIGA ) 10 MG TABS tablet Take 1 tablet (10 mg total) by mouth daily before breakfast. (Patient not taking: Reported on 07/28/2023) 90 tablet 1   No current facility-administered medications for this visit.    PHYSICAL EXAMINATION:   .BP (!) 153/77   Pulse 66   Temp (!) 97.5 F (36.4 C)   Resp 20   Wt 177 lb 12.8 oz (80.6 kg)   SpO2 99%   BMI 27.03 kg/m   GENERAL:alert, in no acute distress and comfortable SKIN: no acute rashes, no significant lesions EYES: conjunctiva are pink and non-injected, sclera anicteric OROPHARYNX: MMM, no  exudates, no oropharyngeal erythema or ulceration NECK: supple, no JVD LYMPH:  no palpable lymphadenopathy in the cervical, axillary or inguinal regions LUNGS: clear to auscultation b/l with normal respiratory effort HEART: regular rate & rhythm ABDOMEN:  normoactive bowel sounds , non tender, not distended. Extremity: no pedal edema PSYCH: alert & oriented x 3 with fluent speech NEURO: no focal motor/sensory deficits   LABORATORY DATA:   I have reviewed the data as listed     Latest Ref Rng & Units 07/14/2023    1:48 PM 03/17/2023    1:39 PM 12/17/2022    1:03 PM  CBC  WBC 4.0 - 10.5 K/uL 4.2  3.6  3.7   Hemoglobin 13.0 - 17.0 g/dL 86.1  84.7  85.7   Hematocrit 39.0 - 52.0 % 41.7  46.8  43.3   Platelets 150 - 400 K/uL 176  196  172        Latest Ref Rng & Units 07/14/2023    1:48 PM 03/17/2023    1:39 PM 12/17/2022    1:03 PM  CMP  Glucose 70 - 99 mg/dL 898  99  868   BUN 8 - 23 mg/dL 24  30  20    Creatinine 0.61 - 1.24 mg/dL 8.27  8.36  8.32   Sodium 135 - 145 mmol/L 140  137  140   Potassium 3.5 - 5.1 mmol/L 4.1  4.2  3.5   Chloride 98 - 111 mmol/L 106  105  108   CO2 22 - 32 mmol/L 29  27  27    Calcium  8.9 - 10.3 mg/dL 8.9  8.8  9.0   Total Protein 6.5 - 8.1 g/dL 7.3  7.8  7.8   Total Bilirubin 0.0 - 1.2 mg/dL 0.4  0.6  0.4   Alkaline Phos 38 - 126 U/L 79  73  75   AST 15 - 41 U/L 18  15  16    ALT 0 - 44 U/L 26  16  18         RADIOGRAPHIC STUDIES: I have personally reviewed the radiological images as listed and agreed with the findings in the report. No results found.   ASSESSMENT & PLAN:   78 y.o.  with multiple medical comorbidities but fairly good performance status overall with   1)  ISS Stage II IgG K[place] multiple myeloma. Anemia + Renal insufficiency.  Bone survey neg for concerning bone lesions. significantly elevated kappa free light chains 858, lambda 18.4, with a ratio of 46.65 . He was also noted to have an M spike of 1.7 g/dL with IFE showing  IgG kappa monoclonal protein. Quantitative IgG level was increased to nearly 2500 mg/dL. 24-hour UPEP showed monoclonal protein of 1300 mg per 24 hours which constituted about 86% of all the urinary protein. Bone marrow biopsy showed 20-30% Restricted plasma cells consistent with multiple myeloma Cytogenetics/FISH  Showed +4, +11, and +17   Patient has been treated with CyBord X 2 cycles . Treatment interrupted due to severe c diff colitis with prolonged hospitalization and decline in performance status.  Patient is much improved and back to baseline now. M protein undetected with neg IFE Has been treated with 5 cycles of Rd  Had been on maintenance Revlimid  for more than 2 years until PEs in August 2023.  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 Had an element of obstructive uropathy due to BPH which has resolved. Creatinine continues to be stable.   3) Normocytic anemia likely related to multiple myeloma.-stable. No abdominal pain or discomfort over the right upper quadrant. No fevers or chills.  #4 right lower and middle lobe pulmonary embolism August 2023-Revlimid  possible provoking factor. currently off Revlimid . 4) DM2 - controlled -continue management per PC   5) Concern for skin cancer lesion on forehead  PLAN:   -Discussed lab results from 07/14/2023 in detail with patient. CBC normal, showed WBC of 4.2K, hemoglobin of 13.8, and platelets of 176K. -CMP shows stable CKD -myeloma labs are normal -his last myeloma panel continued to show undetectable M protein -K/L light chains normal -kappa free light chains 38.2 -lambda free light chains 21.6 -K/L light chain ration 1.77 -patient continues to remain in remission without any medication for his myeloma -Patient has no obvious lab or clinical evidence of myeloma progression at this time.   -no indication for additional treatment for myeloma at this time -we will plan to alternate  clinic and phone visits with his next appointments -will plan for phone visit in 3-4 months  FOLLOW-UP: Labs in 16 weeks Phone visit with Dr Onesimo in 18 weeks  The total time spent in the appointment was 20 minutes* .  All of the patient's questions were answered with apparent satisfaction. The patient knows to call the clinic with any problems, questions or concerns.   Emaline Onesimo MD MS AAHIVMS [facility] Hematology/Oncology Physician [facility]  .*Total Encounter Time as defined by the Centers for Medicare and Medicaid Services includes, in addition to the face-to-face time of a patient visit (documented in the note above) non-face-to-face time: obtaining and reviewing outside history, ordering and reviewing medications, tests or procedures, care coordination (communications with other health care professionals or caregivers) and documentation in the medical record.    I,Mitra Faeizi,acting as a Neurosurgeon for Emaline Onesimo, MD.,have documented all relevant documentation on the behalf of Emaline Onesimo, MD,as directed by  Emaline Onesimo, MD while in the presence of Emaline Onesimo, MD.  .I have reviewed the above documentation for accuracy and completeness, and I agree with the above. .Mykenzi Vanzile Kishore Felisha Claytor MD

## 2023-07-28 ENCOUNTER — Inpatient Hospital Stay (HOSPITAL_BASED_OUTPATIENT_CLINIC_OR_DEPARTMENT_OTHER): Admitting: Hematology

## 2023-07-28 VITALS — BP 153/77 | HR 66 | Temp 97.5°F | Resp 20 | Wt 177.8 lb

## 2023-07-28 DIAGNOSIS — C9001 Multiple myeloma in remission: Secondary | ICD-10-CM | POA: Diagnosis not present

## 2023-07-28 DIAGNOSIS — Z79899 Other long term (current) drug therapy: Secondary | ICD-10-CM | POA: Diagnosis not present

## 2023-07-28 DIAGNOSIS — E1142 Type 2 diabetes mellitus with diabetic polyneuropathy: Secondary | ICD-10-CM | POA: Diagnosis not present

## 2023-07-28 DIAGNOSIS — Z86711 Personal history of pulmonary embolism: Secondary | ICD-10-CM | POA: Diagnosis not present

## 2023-07-28 DIAGNOSIS — D649 Anemia, unspecified: Secondary | ICD-10-CM | POA: Diagnosis not present

## 2023-07-28 DIAGNOSIS — Z7982 Long term (current) use of aspirin: Secondary | ICD-10-CM | POA: Diagnosis not present

## 2023-07-28 DIAGNOSIS — C61 Malignant neoplasm of prostate: Secondary | ICD-10-CM | POA: Diagnosis not present

## 2023-07-28 DIAGNOSIS — N183 Chronic kidney disease, stage 3 unspecified: Secondary | ICD-10-CM | POA: Diagnosis not present

## 2023-07-28 DIAGNOSIS — E1122 Type 2 diabetes mellitus with diabetic chronic kidney disease: Secondary | ICD-10-CM | POA: Diagnosis not present

## 2023-07-31 ENCOUNTER — Ambulatory Visit: Admitting: Hematology

## 2023-08-04 ENCOUNTER — Ambulatory Visit: Payer: Medicare PPO | Attending: Family Medicine

## 2023-08-04 VITALS — Ht 68.5 in | Wt 177.0 lb

## 2023-08-04 DIAGNOSIS — Z0000 Encounter for general adult medical examination without abnormal findings: Secondary | ICD-10-CM | POA: Diagnosis not present

## 2023-08-04 NOTE — Progress Notes (Addendum)
 Because this visit was a virtual/telehealth visit,  certain criteria was not obtained, such a blood pressure, CBG if applicable, and timed get up and go. Any medications not marked as taking were not mentioned during the medication reconciliation part of the visit. Any vitals not documented were not able to be obtained due to this being a telehealth visit or patient was unable to self-report a recent blood pressure reading due to a lack of equipment at home via telehealth. Vitals that have been documented are verbally provided by the patient.   Subjective:   Douglas Ida. is a 78 y.o. who presents for a Medicare Wellness preventive visit.  As a reminder, Annual Wellness Visits don't include a physical exam, and some assessments may be limited, especially if this visit is performed virtually. We may recommend an in-person follow-up visit with your provider if needed.  Visit Complete: Virtual I connected with  Douglas Rose. on 08/04/23 by a audio enabled telemedicine application and verified that I am speaking with the correct person using two identifiers.  Patient Location: Other:  [facility]  Provider Location: Home Office  I discussed the limitations of evaluation and management by telemedicine. The patient expressed understanding and agreed to proceed.  Vital Signs: Because this visit was a virtual/telehealth visit, some criteria may be missing or patient reported. Any vitals not documented were not able to be obtained and vitals that have been documented are patient reported.  VideoDeclined- This patient declined Librarian, academic. Therefore the visit was completed with audio only.  Persons Participating in Visit: Patient.  AWV Questionnaire: No: Patient Medicare AWV questionnaire was not completed prior to this visit.  Cardiac Risk Factors include: advanced age (>65men, >29 women);diabetes mellitus;hypertension;dyslipidemia;male gender;family history  of premature cardiovascular disease     Objective:    Today's Vitals   08/04/23 0954  Weight: 177 lb (80.3 kg)  Height: 5' 8.5 (1.74 m)  PainSc: 0-No pain   Body mass index is 26.52 kg/m.     08/04/2023    9:56 AM 07/30/2022    3:21 PM 05/23/2022   11:08 AM 07/17/2021    8:53 PM 03/15/2019    9:42 AM 12/09/2017    2:23 PM 09/30/2017    3:13 PM  Advanced Directives  Does Patient Have a Medical Advance Directive? No No No No     Type of Advance Directive         Does patient want to make changes to medical advance directive?         Would patient like information on creating a medical advance directive? No - Patient declined Yes (MAU/Ambulatory/Procedural Areas - Information given) No - Patient declined No - Patient declined        Information is confidential and restricted. Go to Review Flowsheets to unlock data.    Current Medications (verified) Outpatient Encounter Medications as of 08/04/2023  Medication Sig   amLODipine  (NORVASC ) 10 MG tablet TAKE 1 TABLET EVERY DAY   amoxicillin  (AMOXIL ) 250 MG capsule Once before dental procedures (Patient not taking: Reported on 07/28/2023)   aspirin  EC 81 MG tablet Take 1 tablet (81 mg total) by mouth daily. Swallow whole.   atorvastatin  (LIPITOR) 20 MG tablet TAKE 1 TABLET EVERY DAY   Cholecalciferol 10 MCG (400 UNIT) CAPS Take 1 capsule by mouth daily.   dapagliflozin  propanediol (FARXIGA ) 10 MG TABS tablet Take 1 tablet (10 mg total) by mouth daily before breakfast. (Patient not taking: Reported on 07/28/2023)  DULoxetine  (CYMBALTA ) 60 MG capsule TAKE 1 CAPSULE EVERY DAY FOR NEUROPATHY   LANTUS  SOLOSTAR 100 UNIT/ML Solostar Pen Inject 20 Units into the skin daily.   lisinopril  (ZESTRIL ) 5 MG tablet Take 1 tablet (5 mg total) by mouth daily.   metoprolol  succinate (TOPROL -XL) 100 MG 24 hr tablet Take 1 tablet (100 mg total) by mouth daily.   nitroGLYCERIN  (NITROSTAT ) 0.4 MG SL tablet Place 1 tablet (0.4 mg total) under the tongue every 5  (five) minutes x 3 doses as needed for chest pain.   tamsulosin  (FLOMAX ) 0.4 MG CAPS capsule Take 1 capsule (0.4 mg total) by mouth daily.   vitamin B-12 (CYANOCOBALAMIN) 1000 MCG tablet Take 1,000 mcg by mouth daily.   No facility-administered encounter medications on file as of 08/04/2023.    Allergies (verified) Patient has no known allergies.   History: Past Medical History:  Diagnosis Date   Arterial insufficiency of lower extremity (HCC) 09/01/2016   Overview:  Mild (2018)  Last Assessment & Plan:  Mild arterial insufficiency, he does not have classic symptoms of claudication. Advised daily foot checks and would consider further evaluation if new or worsening symptoms  Mild (2018)  Last Assessment & Plan:  asymptomatic.  We will follow-up as needed Formatting of this note might be different from the original. Mild (2018)  Last Assessment & Pla   Benign prostatic hyperplasia 05/02/2020   CAD (coronary artery disease) 2006   stent LAD 3.0x18 Cypher, occluded RCA   Cerumen impaction 10/21/2021   Last Assessment & Plan: Formatting of this note might be different from the original.  Right cerumen impaction cleared with irrigation.  Left with yellow appearing discharge,  patient reports he also recently been flushing it at home. Patient denies any pain.  Concern for immunocompromised, chronic otorrhea, will rx ofloxacin drops per formualry   Cervical lymphadenopathy 03/31/2016   Colitis 05/12/2016   Elevated PSA 01/24/2015   Last Assessment & Plan: Formatting of this note might be different from the original.  Reports he has a pending biopsy which will be rescheduled due to recent DVT.  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  Patient is up-to-date on Covid booster   Infected tooth 03/31/2016   Monoclonal gammopathy    Multiple myeloma in remission (HCC) 07/22/2016   Last Assessment & Plan:  Formatting of this note might be different from the original.  Patient remains in remission, followed by oncology.  Had labs drawn today   Peripheral neuropathy 07/22/2016   Last Assessment & Plan:  Overall improved. Patient has discontinued gabapentin , did not feel much difference  Feet and fingers.  Multifactorial, DM, Chemo, MM  Last Assessment & Plan:   Doing well on gabapentin  Formatting of this note might be different from the original. Last Assessment & Plan:  Overall improved. Patient has discontinued gabapentin , did not feel much difference   Screening for malignant neoplasm of colon 05/02/2020   Stage 3 chronic kidney disease (HCC) 06/13/2016   Last Assessment & Plan:  Will check Cr/K today  June 2020, annual follow-up [facility]  Last Assessment & Plan:  Recent labs reviewed- stable Formatting of this note might be different from the original. Last Assessment & Plan:  Will check Cr/K today   Type 2 diabetes mellitus (HCC) 07/21/2011   Last Assessment & Plan:   Diabetes improved with fasting glucoses in a safe range.  Continue current regimen. Formatting of this note might be different from the original. Last Assessment & Plan:  Patient's diabetes has been well controlled at baseline on 12 units of Lantus  daily.  He reports some hyperglycemia on the first few days after dexamethasone  with resolution by the end of the week. 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 Patient has discontinued gabapentin , did not feel much difference     Feet and fingers.  Multifactorial, DM, Chemo, MM     Last Assessment &  Plan:    Doing well on gabapentin   Formatting of this note might be different from the original.  Last Assessment & Plan:   Overall improved. Patient has discontinued gabapentin , did not feel much difference     For   Vitamin D deficiency 05/02/2020   Past Surgical History:  Procedure Laterality Date   CARDIAC CATHETERIZATION  2006   CORONARY ANGIOPLASTY WITH STENT PLACEMENT  2007   IR FLUORO GUIDE CV LINE RIGHT  05/20/2016   IR US  GUIDE VASC ACCESS RIGHT  05/20/2016   Family History  Problem Relation Age of Onset   Arrhythmia Mother    Heart disease Mother    Diabetes Sister    Diabetes Brother    Cancer Brother    Stroke Father    Diabetes Brother    Social History   Socioeconomic History   Marital status: Married    Spouse name: Not on file   Number of children: 6   Years of education: Not on file   Highest education level: Not on file  Occupational History   Occupation: retired Optician, dispensing  Tobacco Use   Smoking status: Never   Smokeless tobacco: Never  Vaping Use   Vaping status: Never Used  Substance and Sexual Activity   Alcohol use: No   Drug use: No   Sexual activity: Not Currently  Other Topics Concern   Not on file  Social History Narrative   Not on file   Social Drivers of Health   Financial Resource Strain: Low Risk  (08/04/2023)   Overall Financial Resource Strain (CARDIA)    Difficulty of Paying Living Expenses: Not hard at all  Food Insecurity: No Food Insecurity (08/04/2023)   Hunger Vital Sign    Worried About Running Out of Food in the Last Year: Never true    Ran Out of Food in the Last Year: Never true  Transportation Needs: No Transportation Needs (08/04/2023)   PRAPARE - Administrator, Civil Service (Medical): No    Lack of Transportation (Non-Medical): No  Physical Activity: Sufficiently Active (08/04/2023)   Exercise Vital Sign    Days of Exercise per Week: 5 days    Minutes of Exercise per Session: 30 min  Stress: No Stress  Concern Present (08/04/2023)   Douglas Rose of Occupational Health - Occupational Stress Questionnaire    Feeling of Stress: Not at all  Social Connections: Moderately Integrated (08/04/2023)   Social Connection and Isolation Panel    Frequency of Communication with Friends and Family: More than three times a week    Frequency of Social Gatherings with  Friends and Family: Three times a week    Attends Religious Services: More than 4 times per year    Active Member of Clubs or Organizations: No    Attends Banker Meetings: Never    Marital Status: Married    Tobacco Counseling Counseling given: Not Answered    Clinical Intake:  Pre-visit preparation completed: Yes  Pain : No/denies pain Pain Score: 0-No pain     BMI - recorded: 26.91 Nutritional Status: BMI 25 -29 Overweight Nutritional Risks: None Diabetes: Yes CBG done?: No Did pt. bring in CBG monitor from home?: No  Lab Results  Component Value Date   HGBA1C 7.5 (A) 03/03/2023   HGBA1C 6.7 11/25/2022   HGBA1C 9.7 (H) 11/13/2012     How often do you need to have someone help you when you read instructions, pamphlets, or other written materials from your doctor or pharmacy?: 1 - Never  Interpreter Needed?: No  Information entered by :: Kerwin Augustus N. Quinnie Barcelo, LPN.   Activities of Daily Living     08/04/2023   10:00 AM  In your present state of health, do you have any difficulty performing the following activities:  Hearing? 0  Vision? 0  Difficulty concentrating or making decisions? 0  Comment BSE: AVID READER  Walking or climbing stairs? 0  Dressing or bathing? 0  Doing errands, shopping? 0  Preparing Food and eating ? N  Using the Toilet? N  In the past six months, have you accidently leaked urine? N  Do you have problems with loss of bowel control? N  Managing your Medications? N  Managing your Finances? N  Housekeeping or managing your Housekeeping? N    Patient Care Team: Delbert Clam, MD as PCP - General (Family Medicine) Onesimo Emaline Brink, MD as Consulting Physician (Hematology) Claudene Muskrat, OD (Optometry)  I have updated your Care Teams any recent Medical Services you may have received from other providers in the past year.     Assessment:   This is a routine wellness examination for Douglas Rose.  Hearing/Vision screen Hearing Screening - Comments:: Denies hearing difficulties.   Vision Screening - Comments:: Wears rx glasses - up to date with routine eye exams with Muskrat Claudene, OD.    Goals Addressed             This Visit's Progress    08/04/2023: To get off of some of my medications and to maintain.         Depression Screen     08/04/2023    9:58 AM 07/28/2023    3:33 PM 04/06/2023    2:28 PM 03/03/2023    2:31 PM 11/25/2022    3:27 PM 09/01/2022   10:56 AM 07/30/2022    3:20 PM  PHQ 2/9 Scores  PHQ - 2 Score 0 0 0 0 0 0 0  PHQ- 9 Score 0  0 0 0 0     Fall Risk     08/04/2023    9:58 AM 04/06/2023    2:28 PM 03/03/2023    2:31 PM 11/25/2022    3:27 PM 09/01/2022   10:53 AM  Fall Risk   Falls in the past year? 0 0 0 0 0  Number falls in past yr: 0 0 0 0 0  Injury with Fall? 0 0 0 0 0  Risk for fall due to : No Fall Risks No Fall Risks No Fall Risks No Fall Risks No Fall Risks  Follow up Falls  evaluation completed Falls evaluation completed Falls evaluation completed Falls evaluation completed Falls evaluation completed    MEDICARE RISK AT HOME:  Medicare Risk at Home Any stairs in or around the home?: Yes If so, are there any without handrails?: No Home free of loose throw rugs in walkways, pet beds, electrical cords, etc?: Yes Adequate lighting in your home to reduce risk of falls?: Yes Life alert?: Yes Use of a cane, walker or w/c?: No Grab bars in the bathroom?: Yes Shower chair or bench in shower?: No Elevated toilet seat or a handicapped toilet?: Yes  TIMED UP AND GO:  Was the test performed?  No  Cognitive  Function: Declined/Normal: No cognitive concerns noted by patient or family. Patient alert, oriented, able to answer questions appropriately and recall recent events. No signs of memory loss or confusion.    08/04/2023   10:00 AM  MMSE - Mini Mental State Exam  Not completed: Unable to complete        08/04/2023   10:02 AM 07/30/2022    3:28 PM  6CIT Screen  What Year? 0 points 0 points  What month? 0 points 0 points  What time? 0 points 0 points  Count back from 20 0 points 0 points  Months in reverse 0 points 0 points  Repeat phrase 0 points 0 points  Total Score 0 points 0 points    Immunizations Immunization History  Administered Date(s) Administered   DT (Pediatric) 03/05/2001   Fluad Quad(high Dose 65+) 09/29/2017, 09/27/2019, 10/16/2020, 11/07/2021   Fluad Trivalent(High Dose 65+) 11/25/2022   Influenza Split 10/14/2000, 10/07/2007, 09/06/2008, 10/06/2009   Influenza, High Dose Seasonal PF 12/07/2018   Influenza, Seasonal, Injecte, Preservative Fre 10/15/2016   Influenza,inj,Quad PF,6+ Mos 10/15/2016   Influenza,trivalent, recombinat, inj, PF 10/14/2000, 10/07/2007, 09/06/2008, 10/06/2009   Influenza-Unspecified 10/14/2000, 10/07/2007, 09/06/2008, 10/06/2009, 10/15/2016, 12/07/2018   PFIZER(Purple Top)SARS-COV-2 Vaccination 03/07/2019, 04/06/2019, 10/22/2019, 10/04/2020   Pneumococcal Conjugate-13 07/19/2014   Pneumococcal Polysaccharide-23 04/28/2007, 11/20/2015   Pneumococcal-Unspecified 04/28/2007   Tdap 01/07/2003, 12/18/2015    Screening Tests Health Maintenance  Topic Date Due   Zoster Vaccines- Shingrix (1 of 2) Never done   COVID-19 Vaccine (5 - 2024-25 season) 09/07/2022   FOOT EXAM  07/03/2023   Diabetic kidney evaluation - Urine ACR  07/03/2027 (Originally 11/29/1963)   INFLUENZA VACCINE  08/07/2023   HEMOGLOBIN A1C  08/31/2023   OPHTHALMOLOGY EXAM  06/30/2024   Diabetic kidney evaluation - eGFR measurement  07/13/2024   Medicare Annual Wellness  (AWV)  08/03/2024   DTaP/Tdap/Td (4 - Td or Tdap) 12/17/2025   Pneumococcal Vaccine: 50+ Years  Completed   Hepatitis C Screening  Completed   Hepatitis B Vaccines  Aged Out   HPV VACCINES  Aged Out   Meningococcal B Vaccine  Aged Out    Health Maintenance  Health Maintenance Due  Topic Date Due   Zoster Vaccines- Shingrix (1 of 2) Never done   COVID-19 Vaccine (5 - 2024-25 season) 09/07/2022   FOOT EXAM  07/03/2023   Health Maintenance Items Addressed: Yes Patient aware of current care gaps.  Additional Screening:  Vision Screening: Recommended annual ophthalmology exams for early detection of glaucoma and other disorders of the eye. Would you like a referral to an eye doctor? No    Dental Screening: Recommended annual dental exams for proper oral hygiene  Community Resource Referral / Chronic Care Management: CRR required this visit?  No   CCM required this visit?  No   Plan:  I have personally reviewed and noted the following in the patient's chart:   Medical and social history Use of alcohol, tobacco or illicit drugs  Current medications and supplements including opioid prescriptions. Patient is not currently taking opioid prescriptions. Functional ability and status Nutritional status Physical activity Advanced directives List of other physicians Hospitalizations, surgeries, and ER visits in previous 12 months Vitals Screenings to include cognitive, depression, and falls Referrals and appointments  In addition, I have reviewed and discussed with patient certain preventive protocols, quality metrics, and best practice recommendations. A written personalized care plan for preventive services as well as general preventive health recommendations were provided to patient.   Roz LOISE Fuller, LPN   2/[id number]/[id number]   After Visit Summary: (MyChart) Due to this being a telephonic visit, the after visit summary with patients personalized plan was offered to patient  via MyChart   Notes: Patient is due for the following care gaps: Covid-19, Diabetic Foot Exam and Shingrix.

## 2023-08-04 NOTE — Patient Instructions (Signed)
 Douglas Rose , Thank you for taking time out of your busy schedule to complete your Annual Wellness Visit with me. I enjoyed our conversation and look forward to speaking with you again next year. I, as well as your care team,  appreciate your ongoing commitment to your health goals. Please review the following plan we discussed and let me know if I can assist you in the future. Your Game plan/ To Do List    Referrals: If you haven't heard from the office you've been referred to, please reach out to them at the phone provided.   Follow up Visits: Next Medicare AWV with our clinical staff: 08/09/2024 at 2:10 pm phone visit with Nurse Health Advisor    Have you seen your provider in the last 6 months (3 months if uncontrolled diabetes)? Yes Next Office Visit with your provider: 09/08/2023 at 2:30 pm office visit with Dr. Delbert  Clinician Recommendations:  Aim for 30 minutes of exercise or brisk walking, 6-8 glasses of water , and 5 servings of fruits and vegetables each day.       This is a list of the screening recommended for you and due dates:  Health Maintenance  Topic Date Due   Zoster (Shingles) Vaccine (1 of 2) Never done   COVID-19 Vaccine (5 - 2024-25 season) 09/07/2022   Complete foot exam   07/03/2023   Yearly kidney health urinalysis for diabetes  07/03/2027*   Flu Shot  08/07/2023   Hemoglobin A1C  08/31/2023   Eye exam for diabetics  06/30/2024   Yearly kidney function blood test for diabetes  07/13/2024   Medicare Annual Wellness Visit  08/03/2024   DTaP/Tdap/Td vaccine (4 - Td or Tdap) 12/17/2025   Pneumococcal Vaccine for age over 56  Completed   Hepatitis C Screening  Completed   Hepatitis B Vaccine  Aged Out   HPV Vaccine  Aged Out   Meningitis B Vaccine  Aged Out  *Topic was postponed. The date shown is not the original due date.    Advanced directives: (Declined) Advance directive discussed with you today. Even though you declined this today, please call our office  should you change your mind, and we can give you the proper paperwork for you to fill out. Advance Care Planning is important because it:  [x]  Makes sure you receive the medical care that is consistent with your values, goals, and preferences  [x]  It provides guidance to your family and loved ones and reduces their decisional burden about whether or not they are making the right decisions based on your wishes.  Follow the link provided in your after visit summary or read over the paperwork we have mailed to you to help you started getting your Advance Directives in place. If you need assistance in completing these, please reach out to us  so that we can help you!  See attachments for Preventive Care and Fall Prevention Tips.

## 2023-08-06 DIAGNOSIS — Z794 Long term (current) use of insulin: Secondary | ICD-10-CM | POA: Diagnosis not present

## 2023-08-06 DIAGNOSIS — E119 Type 2 diabetes mellitus without complications: Secondary | ICD-10-CM | POA: Diagnosis not present

## 2023-08-09 DIAGNOSIS — E119 Type 2 diabetes mellitus without complications: Secondary | ICD-10-CM | POA: Diagnosis not present

## 2023-08-09 DIAGNOSIS — Z794 Long term (current) use of insulin: Secondary | ICD-10-CM | POA: Diagnosis not present

## 2023-08-17 ENCOUNTER — Telehealth: Payer: Self-pay

## 2023-08-17 NOTE — Telephone Encounter (Signed)
 Paperwork has been received and patient will be informed once complete.    Copied from CRM [id number]. Topic: General - Other >> Aug 17, 2023 11:17 AM Jasmin G wrote: Reason for CRM: Pt will drop off paperwork needed from his medical program management at clinic that needs to be signed by PCP, Dr. Newlin to get qualified for farxiga  and lantus  med prescription.SABRA

## 2023-08-20 ENCOUNTER — Other Ambulatory Visit: Payer: Self-pay | Admitting: Critical Care Medicine

## 2023-08-21 ENCOUNTER — Telehealth: Payer: Self-pay | Admitting: Family Medicine

## 2023-08-21 NOTE — Telephone Encounter (Signed)
 Copied from CRM [id number]. Topic: General - Other >> Aug 21, 2023  9:11 AM Jasmin G wrote:  Reason for CRM: Pt called regarding recent message from clinic, he states that he was told to try come in for an appt earlier than Sep 2nd, but he wanted to let the clinic know he's fine with the appt he got.

## 2023-08-21 NOTE — Telephone Encounter (Signed)
 Noted

## 2023-08-24 ENCOUNTER — Other Ambulatory Visit: Payer: Self-pay

## 2023-08-26 ENCOUNTER — Other Ambulatory Visit: Payer: Self-pay

## 2023-08-28 ENCOUNTER — Telehealth: Payer: Self-pay | Admitting: Family Medicine

## 2023-08-28 NOTE — Telephone Encounter (Signed)
 Copied from CRM #[id number]. Topic: Clinical - Medication Question >> Aug 27, 2023 12:13 PM Santiya F wrote: Reason for CRM: Patient is calling in requesting the status of paperwork for assistance with Lantus . Patient says he received the approval for Farxiga  but hasn't heard anything regarding the Lantus . Patient is requesting an update on this because he says he needs to let the program he's in know what's going on. Please advise.

## 2023-08-28 NOTE — Telephone Encounter (Signed)
 Dr. Newlin has it in her folder to sign today.

## 2023-08-28 NOTE — Telephone Encounter (Signed)
 Walterine Sor,  Did you receive the paperwork that I emailed to you regarding patient medication?

## 2023-08-31 ENCOUNTER — Other Ambulatory Visit: Payer: Self-pay | Admitting: Family Medicine

## 2023-09-01 ENCOUNTER — Telehealth: Payer: Self-pay

## 2023-09-01 NOTE — Telephone Encounter (Addendum)
 Patient came in the office and is inquiring about paperwork for medication, has it been completed? Please advise.

## 2023-09-01 NOTE — Telephone Encounter (Signed)
Submitted application for LANTUS SOLOSTAR to [facility] for patient assistance.   Phone: [phone]

## 2023-09-01 NOTE — Telephone Encounter (Signed)
 I have emailed the PCP portion of the form.

## 2023-09-03 ENCOUNTER — Other Ambulatory Visit: Payer: Self-pay

## 2023-09-03 ENCOUNTER — Telehealth: Payer: Self-pay

## 2023-09-03 MED ORDER — LANTUS SOLOSTAR 100 UNIT/ML ~~LOC~~ SOPN
20.0000 [IU] | PEN_INJECTOR | Freq: Every day | SUBCUTANEOUS | 6 refills | Status: DC
  Filled 2023-09-03: qty 6, 30d supply, fill #0

## 2023-09-03 NOTE — Addendum Note (Signed)
 Addended by: FLEETA TONIA SENIOR L on: 09/03/2023 03:53 PM   Modules accepted: Orders

## 2023-09-03 NOTE — Telephone Encounter (Signed)
 Received notification from [facility] regarding approval for LANTUS  SOLOSTAR. Patient assistance approved from 09/03/2023 to 01/06/2024.  Medication will ship to [facility] [address], [id number]  Pt ID: [id number]  Company phone: [phone]

## 2023-09-04 ENCOUNTER — Other Ambulatory Visit (HOSPITAL_COMMUNITY): Payer: Self-pay

## 2023-09-04 ENCOUNTER — Other Ambulatory Visit: Payer: Self-pay

## 2023-09-04 ENCOUNTER — Telehealth: Payer: Self-pay | Admitting: Family Medicine

## 2023-09-04 NOTE — Telephone Encounter (Signed)
 Pt unconfirmed appt lvm 8/29

## 2023-09-06 DIAGNOSIS — E119 Type 2 diabetes mellitus without complications: Secondary | ICD-10-CM | POA: Diagnosis not present

## 2023-09-06 DIAGNOSIS — Z794 Long term (current) use of insulin: Secondary | ICD-10-CM | POA: Diagnosis not present

## 2023-09-08 ENCOUNTER — Ambulatory Visit: Attending: Family Medicine | Admitting: Family Medicine

## 2023-09-08 ENCOUNTER — Encounter: Payer: Self-pay | Admitting: Family Medicine

## 2023-09-08 VITALS — BP 121/75 | HR 70 | Ht 68.5 in | Wt 178.6 lb

## 2023-09-08 DIAGNOSIS — Z794 Long term (current) use of insulin: Secondary | ICD-10-CM

## 2023-09-08 DIAGNOSIS — N1831 Chronic kidney disease, stage 3a: Secondary | ICD-10-CM | POA: Diagnosis not present

## 2023-09-08 DIAGNOSIS — M62838 Other muscle spasm: Secondary | ICD-10-CM | POA: Diagnosis not present

## 2023-09-08 DIAGNOSIS — H6123 Impacted cerumen, bilateral: Secondary | ICD-10-CM

## 2023-09-08 DIAGNOSIS — I739 Peripheral vascular disease, unspecified: Secondary | ICD-10-CM

## 2023-09-08 DIAGNOSIS — C9001 Multiple myeloma in remission: Secondary | ICD-10-CM

## 2023-09-08 DIAGNOSIS — I129 Hypertensive chronic kidney disease with stage 1 through stage 4 chronic kidney disease, or unspecified chronic kidney disease: Secondary | ICD-10-CM

## 2023-09-08 DIAGNOSIS — L819 Disorder of pigmentation, unspecified: Secondary | ICD-10-CM

## 2023-09-08 DIAGNOSIS — R109 Unspecified abdominal pain: Secondary | ICD-10-CM | POA: Diagnosis not present

## 2023-09-08 DIAGNOSIS — E1122 Type 2 diabetes mellitus with diabetic chronic kidney disease: Secondary | ICD-10-CM | POA: Diagnosis not present

## 2023-09-08 DIAGNOSIS — Z23 Encounter for immunization: Secondary | ICD-10-CM | POA: Diagnosis not present

## 2023-09-08 DIAGNOSIS — E119 Type 2 diabetes mellitus without complications: Secondary | ICD-10-CM | POA: Diagnosis not present

## 2023-09-08 DIAGNOSIS — C61 Malignant neoplasm of prostate: Secondary | ICD-10-CM

## 2023-09-08 DIAGNOSIS — I2510 Atherosclerotic heart disease of native coronary artery without angina pectoris: Secondary | ICD-10-CM

## 2023-09-08 DIAGNOSIS — N401 Enlarged prostate with lower urinary tract symptoms: Secondary | ICD-10-CM

## 2023-09-08 LAB — POCT GLYCOSYLATED HEMOGLOBIN (HGB A1C): HbA1c, POC (controlled diabetic range): 7.9 % — AB (ref 0.0–7.0)

## 2023-09-08 MED ORDER — LANTUS SOLOSTAR 100 UNIT/ML ~~LOC~~ SOPN: 22.0000 [IU] | PEN_INJECTOR | Freq: Every day | SUBCUTANEOUS | 6 refills | Status: DC

## 2023-09-08 MED ORDER — TRIAMCINOLONE ACETONIDE 0.1 % EX CREA: 1.0000 | TOPICAL_CREAM | Freq: Two times a day (BID) | CUTANEOUS | 0 refills | Status: AC

## 2023-09-08 MED ORDER — TRIAMCINOLONE ACETONIDE 0.1 % EX CREA
1.0000 | TOPICAL_CREAM | Freq: Two times a day (BID) | CUTANEOUS | 0 refills | Status: DC
Start: 2023-09-08 — End: 2023-09-08

## 2023-09-08 NOTE — Progress Notes (Signed)
 Subjective:  Patient ID: Douglas Rose., male    DOB: [birth year]  Age: 78 y.o. MRN: [id number]  CC: Medical Management of Chronic Issues (Low back pain in the am/Discoloration on leg/)     Discussed the use of AI scribe software for clinical note transcription with the patient, who gave verbal consent to proceed.  History of Present Illness Douglas Rose. is a 78 year old male with a history of  CAD (s/p stent placement), HTN, Type 2 DM, Multiple Myeloma in remission, history of PE (completed anticoagulation with Eliquis ), PAD , Prostate cancer, BPH who presents with low back pain.  He experiences intermittent low back pain localized to the left flank, primarily upon waking. The pain resolves after a few steps and does not occur daily. Heating pad and Voltaren gel have not provided relief.  He complained of similar symptoms at his last visit 6 months ago.  He has prostate cancer diagnosed six to seven months ago and is under observation urologic care. He experiences frequent urination, especially nocturnally, without dysuria. He is on Flomax  with a follow-up appointment scheduled.  His type 2 diabetes is managed with Farxiga  10 mg and Lantus  22 units. Recent A1c is 7.9 up from 7.5 with blood sugar readings from 68 to 125. He experiences neuropathy with numbness and occasional shooting pains, managed with Cymbalta .  He notes a discoloration on his leg present for about a year, occasionally itchy, with a family history of vitiligo. He also hears a buzzing sound, described as 'like crickets', with a history of earwax buildup.    Past Medical History:  Diagnosis Date   Arterial insufficiency of lower extremity (HCC) 09/01/2016   Overview:  Mild (2018)  Last Assessment & Plan:  Mild arterial insufficiency, he does not have classic symptoms of claudication. Advised daily foot checks and would consider further evaluation if new or worsening symptoms  Mild (2018)  Last Assessment & Plan:   asymptomatic.  We will follow-up as needed Formatting of this note might be different from the original. Mild (2018)  Last Assessment & Pla   Benign prostatic hyperplasia 05/02/2020   CAD (coronary artery disease) 2006   stent LAD 3.0x18 Cypher, occluded RCA   Cerumen impaction 10/21/2021   Last Assessment & Plan: Formatting of this note might be different from the original.  Right cerumen impaction cleared with irrigation.  Left with yellow appearing discharge,  patient reports he also recently been flushing it at home. Patient denies any pain.  Concern for immunocompromised, chronic otorrhea, will rx ofloxacin drops per formualry   Cervical lymphadenopathy 03/31/2016   Colitis 05/12/2016   Elevated PSA 01/24/2015   Last Assessment & Plan: Formatting of this note might be different from the original.  Reports he has a pending biopsy which will be rescheduled due to recent DVT.  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  Patient is up-to-date on Covid booster   Infected tooth 03/31/2016   Monoclonal gammopathy    Multiple myeloma in remission (HCC) 07/22/2016   Last Assessment & Plan:  Formatting of this note might be different from  the original.  Patient remains in remission, followed by oncology.  Had labs drawn today   Peripheral neuropathy 07/22/2016   Last Assessment & Plan:  Overall improved. Patient has discontinued gabapentin , did not feel much difference  Feet and fingers.  Multifactorial, DM, Chemo, MM  Last Assessment & Plan:   Doing well on gabapentin  Formatting of this note might be different from  the original. Last Assessment & Plan:  Overall improved. Patient has discontinued gabapentin , did not feel much difference   Screening for malignant neoplasm of colon 05/02/2020   Stage 3 chronic kidney disease (HCC) 06/13/2016   Last Assessment & Plan:  Will check Cr/K today  June 2020, annual follow-up [facility]  Last Assessment & Plan:   Recent labs reviewed- stable Formatting of this note might be different from the original. Last Assessment & Plan:  Will check Cr/K today   Type 2 diabetes mellitus (HCC) 07/21/2011   Last Assessment & Plan:   Diabetes improved with fasting glucoses in a safe range.  Continue current regimen. Formatting of this note might be different from the original. Last Assessment & Plan:  Patient's diabetes has been well controlled at baseline on 12 units of Lantus  daily.  He reports some hyperglycemia on the first few days after dexamethasone  with resolution by the end of the week. 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 Patient has discontinued gabapentin , did not feel much difference     Feet and fingers.  Multifactorial, DM, Chemo, MM     Last Assessment & Plan:    Doing well on gabapentin   Formatting of this note might be different from the original.  Last Assessment & Plan:   Overall improved. Patient has discontinued gabapentin , did not feel much difference     For   Vitamin D deficiency 05/02/2020    Past Surgical History:  Procedure Laterality Date   CARDIAC CATHETERIZATION  2006   CORONARY ANGIOPLASTY WITH STENT PLACEMENT  2007   IR FLUORO GUIDE CV LINE RIGHT  05/20/2016   IR US  GUIDE VASC ACCESS RIGHT  05/20/2016    Family History  Problem Relation Age of Onset   Arrhythmia Mother    Heart disease Mother    Diabetes Sister    Diabetes Brother    Cancer Brother    Stroke Father    Diabetes Brother     Social History   Socioeconomic History   Marital status:  Married    Spouse name: Not on file   Number of children: 6   Years of education: Not on file   Highest education level: Not on file  Occupational History   Occupation: retired Optician, dispensing  Tobacco Use   Smoking status: Never   Smokeless tobacco: Never  Vaping Use   Vaping status: Never Used  Substance and Sexual Activity   Alcohol use: No   Drug use: No   Sexual activity: Not Currently  Other Topics Concern   Not on file  Social History Narrative   Not on file   Social Drivers of Health   Financial Resource Strain: Low Risk  (08/04/2023)   Overall Financial Resource Strain (CARDIA)    Difficulty of Paying Living Expenses: Not hard at all  Food Insecurity: No Food Insecurity (08/04/2023)   Hunger Vital Sign    Worried About Running Out of Food in the Last Year: Never true    Ran Out of  Food in the Last Year: Never true  Transportation Needs: No Transportation Needs (08/04/2023)   PRAPARE - Administrator, Civil Service (Medical): No    Lack of Transportation (Non-Medical): No  Physical Activity: Sufficiently Active (08/04/2023)   Exercise Vital Sign    Days of Exercise per Week: 5 days    Minutes of Exercise per Session: 30 min  Stress: No Stress Concern Present (08/04/2023)   [employer] of Occupational Health - Occupational Stress Questionnaire    Feeling of Stress: Not at all  Social Connections: Moderately Integrated (08/04/2023)   Social Connection and Isolation Panel    Frequency of Communication with Friends and Family: More than three times a week    Frequency of Social Gatherings with Friends and Family: Three times a week    Attends Religious Services: More than 4 times per year    Active Member of Clubs or Organizations: No    Attends Banker Meetings: Never    Marital Status: Married    No Known Allergies  Outpatient Medications Prior to Visit  Medication Sig Dispense Refill   amLODipine  (NORVASC ) 10 MG tablet TAKE 1 TABLET EVERY  DAY 90 tablet 3   aspirin  EC 81 MG tablet Take 1 tablet (81 mg total) by mouth daily. Swallow whole. 90 tablet 3   atorvastatin  (LIPITOR) 20 MG tablet TAKE 1 TABLET EVERY DAY 90 tablet 1   Cholecalciferol 10 MCG (400 UNIT) CAPS Take 1 capsule by mouth daily.     dapagliflozin  propanediol (FARXIGA ) 10 MG TABS tablet Take 1 tablet (10 mg total) by mouth daily before breakfast. 90 tablet 1   DULoxetine  (CYMBALTA ) 60 MG capsule TAKE 1 CAPSULE EVERY DAY FOR NEUROPATHY 90 capsule 3   lisinopril  (ZESTRIL ) 5 MG tablet TAKE 1 TABLET EVERY DAY (DOSE INCREASE) 90 tablet 1   metoprolol  succinate (TOPROL -XL) 100 MG 24 hr tablet TAKE 1 TABLET EVERY DAY 90 tablet 3   nitroGLYCERIN  (NITROSTAT ) 0.4 MG SL tablet Place 1 tablet (0.4 mg total) under the tongue every 5 (five) minutes x 3 doses as needed for chest pain. 30 tablet 12   tamsulosin  (FLOMAX ) 0.4 MG CAPS capsule Take 1 capsule (0.4 mg total) by mouth daily. 90 capsule 1   vitamin B-12 (CYANOCOBALAMIN) 1000 MCG tablet Take 1,000 mcg by mouth daily.     LANTUS  SOLOSTAR 100 UNIT/ML Solostar Pen Inject 20 Units into the skin daily. 15 mL 6   amoxicillin  (AMOXIL ) 250 MG capsule Once before dental procedures (Patient not taking: Reported on 09/08/2023) 5 capsule 0   No facility-administered medications prior to visit.     ROS Review of Systems  Constitutional:  Negative for activity change and appetite change.  HENT:  Positive for tinnitus. Negative for sinus pressure and sore throat.   Respiratory:  Negative for chest tightness, shortness of breath and wheezing.   Cardiovascular:  Negative for chest pain and palpitations.  Gastrointestinal:  Negative for abdominal distention, abdominal pain and constipation.  Genitourinary: Negative.   Musculoskeletal: Negative.   Psychiatric/Behavioral:  Negative for behavioral problems and dysphoric mood.     Objective:  BP 121/75   Pulse 70   Ht 5' 8.5 (1.74 m)   Wt 178 lb 9.6 oz (81 kg)   SpO2 100%   BMI  26.76 kg/m      09/08/2023    2:43 PM 08/04/2023    9:54 AM 07/28/2023    3:33 PM  BP/Weight  Systolic BP 121  846  Diastolic BP 75  77  Wt. (Lbs) 178.6 177   BMI 26.76 kg/m2 26.52 kg/m2       Physical Exam Constitutional:      Appearance: He is well-developed.  Cardiovascular:     Rate and Rhythm: Normal rate.     Heart sounds: Normal heart sounds. No murmur heard. Pulmonary:     Effort: Pulmonary effort is normal.     Breath sounds: Normal breath sounds. No wheezing or rales.  Chest:     Chest wall: No tenderness.  Abdominal:     General: Bowel sounds are normal. There is no distension.     Palpations: Abdomen is soft. There is no mass.     Tenderness: There is no abdominal tenderness.  Musculoskeletal:        General: Normal range of motion.     Right lower leg: No edema.     Left lower leg: No edema.     Comments: Slight tenderness on palpation of ala of iliac crest on left  Negative straight leg raise bilaterally  Skin:    Comments: Area of variegated changes alternating hypo and hyperpigmentation in left calf with no scaling  Neurological:     Mental Status: He is alert and oriented to person, place, and time.  Psychiatric:        Mood and Affect: Mood normal.        Latest Ref Rng & Units 07/14/2023    1:48 PM 03/17/2023    1:39 PM 12/17/2022    1:03 PM  CMP  Glucose 70 - 99 mg/dL 898  99  868   BUN 8 - 23 mg/dL 24  30  20    Creatinine 0.61 - 1.24 mg/dL 8.27  8.36  8.32   Sodium 135 - 145 mmol/L 140  137  140   Potassium 3.5 - 5.1 mmol/L 4.1  4.2  3.5   Chloride 98 - 111 mmol/L 106  105  108   CO2 22 - 32 mmol/L 29  27  27    Calcium  8.9 - 10.3 mg/dL 8.9  8.8  9.0   Total Protein 6.5 - 8.1 g/dL 7.3  7.8  7.8   Total Bilirubin 0.0 - 1.2 mg/dL 0.4  0.6  0.4   Alkaline Phos 38 - 126 U/L 79  73  75   AST 15 - 41 U/L 18  15  16    ALT 0 - 44 U/L 26  16  18      Lipid Panel     Component Value Date/Time   CHOL 137 07/03/2022 1227   TRIG 128 07/03/2022  1227   HDL 54 07/03/2022 1227   CHOLHDL 2.5 07/03/2022 1227   CHOLHDL 4 01/11/2013 1030   VLDL 16.8 01/11/2013 1030   LDLCALC 61 07/03/2022 1227    CBC    Component Value Date/Time   WBC 4.2 07/14/2023 1348   WBC 4.0 05/23/2022 1110   RBC 4.87 07/14/2023 1348   HGB 13.8 07/14/2023 1348   HGB 12.3 (L) 01/08/2017 1338   HCT 41.7 07/14/2023 1348   HCT 37.7 (L) 01/08/2017 1338   PLT 176 07/14/2023 1348   PLT 174 01/08/2017 1338   MCV 85.6 07/14/2023 1348   MCV 85.5 01/08/2017 1338   MCH 28.3 07/14/2023 1348   MCHC 33.1 07/14/2023 1348   RDW 16.2 (H) 07/14/2023 1348   RDW 15.7 (H) 01/08/2017 1338   LYMPHSABS 1.4 07/14/2023 1348   LYMPHSABS 0.9 01/08/2017 1338   MONOABS 0.5  07/14/2023 1348   MONOABS 0.8 01/08/2017 1338   EOSABS 0.1 07/14/2023 1348   EOSABS 0.5 01/08/2017 1338   BASOSABS 0.0 07/14/2023 1348   BASOSABS 0.0 01/08/2017 1338    Lab Results  Component Value Date   HGBA1C 7.9 (A) 09/08/2023    Lab Results  Component Value Date   HGBA1C 7.9 (A) 09/08/2023   HGBA1C 7.5 (A) 03/03/2023   HGBA1C 6.7 11/25/2022       Assessment & Plan Left flank pain Intermittent pain upon waking, resolving after movement. Previous treatments ineffective. - Continue heat and Voltaren gel. - Use Lidoderm  patches as needed. - Rest if pain is severe upon standing.  Type 2 diabetes mellitus with diabetic neuropathy and chronic kidney disease HbA1c increased to 7.9%. Morning sugars within target. Risk of hypoglycemia with Lantus  increase. Neuropathy managed with duloxetine . CKD. - Increase Lantus  to 22 units daily. - Monitor blood sugars closely. - Continue duloxetine . - Consider Lyrica if neuropathy worsens. - Schedule fasting blood work next Wednesday.  Hypertension and stage IIIa chronic kidney disease secondary to type 2 diabetes mellitus Well-controlled with amlodipine , lisinopril , and metoprolol . Avoid nephrotoxins Counseled on blood pressure goal of less than  130/80, low-sodium, DASH diet, medication compliance, 150 minutes of moderate intensity exercise per week. Discussed medication compliance, adverse effects.    Prostate cancer Currently under observation per urology note Keep appointment with urologist  Benign prostatic hyperplasia with lower urinary tract symptoms BPH with nocturia. Prostate cancer under observation with PSA monitoring. - Continue management with urologist and PSA checks.  Disorder of skin pigmentation, unspecified Intermittent itching, no injury or vitiligo history. - Prescribe low-dose triamcinolone  cream as needed.  PAD Last seen by vascular surgery in 12/2023 with plans to follow-up in 1 year with repeat ABI Risk factor modification Continue statin  Bilateral impacted cerumen Ear irrigation performed   Multiple myeloma in remission Stable Continue to follow-up with oncology   CAD Status post stent placement Risk factor modification including use of statin   Meds ordered this encounter  Medications   LANTUS  SOLOSTAR 100 UNIT/ML Solostar Pen    Sig: Inject 22 Units into the skin daily.    Dispense:  15 mL    Refill:  6    Dose increase   DISCONTD: triamcinolone  cream (KENALOG ) 0.1 %    Sig: Apply 1 Application topically 2 (two) times daily.    Dispense:  30 g    Refill:  0   triamcinolone  cream (KENALOG ) 0.1 %    Sig: Apply 1 Application topically 2 (two) times daily.    Dispense:  30 g    Refill:  0    Follow-up: Return in about 5 months (around 02/08/2024) for Chronic medical conditions same day as wife's appointment.       Corrina Sabin, MD, FAAFP. [facility] [employer], [place] [phone]   09/08/2023, 5:37 PM

## 2023-09-08 NOTE — Patient Instructions (Signed)
 Earwax Buildup, Adult Your ears make something called earwax. It helps keep germs called bacteria away and protects the skin in your ears. Sometimes, too much earwax can build up. This can cause discomfort or make it harder to hear. What are the causes? Earwax buildup can happen when you have too much earwax in your ears. Earwax is made in the outer part of your ear canal. It's supposed to fall out in small amounts over time. But if your ears aren't able to clean themselves like they should, earwax can build up. What increases the risk? You're more likely to get earwax buildup if: You clean your ears with cotton swabs. You pick at your ears. You use earplugs or in-ear headphones a lot. You wear hearing aids. You may also be more likely to get it if: You're male. You're older. Your ears naturally make more earwax. You have narrow ear canals or extra hair in your ears. Your earwax is too thick or sticky. You have eczema. You're dehydrated. This means there's not enough fluid in your body. What are the signs or symptoms? Symptoms of earwax buildup include: Not being able to hear as well. A feeling of fullness in your ear. Feeling like your ear is plugged. Fluid coming from your ear. Ear pain or an itchy ear. Ringing in your ear. Coughing or problems with balance. How is this diagnosed? Earwax buildup may be diagnosed based on your symptoms, medical history, and an ear exam. During the exam, your health care provider will look into your ear with a tool called an otoscope. You may also have tests, such as a hearing test. How is this treated? Earwax buildup may be treated by: Using ear drops. Having the earwax removed by a provider. The provider may: Flush the ear with water. Use a tool called a curette that has a loop on the end. Use a suction device. Having surgery. This may be done in severe cases. Follow these instructions at home:  Cleaning your ears Clean your ears as told  by your provider. You can clean the outside of your ears with a washcloth or tissue. Do not overclean your ears. Do not put anything into your ear unless told. This includes cotton swabs. General instructions Take over-the-counter and prescription medicines only as told by your provider. Drink enough fluid to keep your pee (urine) pale yellow. This helps thin the earwax. If you have hearing aids, clean them as told. Keep all follow-up visits. If earwax builds up in your ears often or if you use hearing aids, ask your provider how often you should have your ears cleaned. Contact a health care provider if: Your ear pain gets worse. You have a fever. You have pus, blood, or other fluid coming from your ear. You have hearing loss. You have ringing in your ears that won't go away. You feel like the room is spinning. This is called vertigo. Your symptoms don't get better with treatment. This information is not intended to replace advice given to you by your health care provider. Make sure you discuss any questions you have with your health care provider. Document Revised: 03/06/2022 Document Reviewed: 03/06/2022 Elsevier Patient Education  2024 [employer].

## 2023-09-10 ENCOUNTER — Ambulatory Visit: Attending: Family Medicine

## 2023-09-10 DIAGNOSIS — I739 Peripheral vascular disease, unspecified: Secondary | ICD-10-CM | POA: Diagnosis not present

## 2023-09-11 ENCOUNTER — Ambulatory Visit: Payer: Self-pay | Admitting: Family Medicine

## 2023-09-11 LAB — LP+NON-HDL CHOLESTEROL
Cholesterol, Total: 121 mg/dL (ref 100–199)
HDL: 49 mg/dL (ref >39)
LDL Chol Calc (NIH): 55 mg/dL (ref 0–99)
Total Non-HDL-Chol (LDL+VLDL): 72 mg/dL (ref 0–129)
Triglycerides: 87 mg/dL (ref 0–149)
VLDL Cholesterol Cal: 17 mg/dL (ref 5–40)

## 2023-09-11 LAB — CMP14+EGFR
ALT: 20 IU/L (ref 0–44)
AST: 17 IU/L (ref 0–40)
Albumin: 4.1 g/dL (ref 3.8–4.8)
Alkaline Phosphatase: 82 IU/L (ref 44–121)
BUN/Creatinine Ratio: 14 (ref 10–24)
BUN: 23 mg/dL (ref 8–27)
Bilirubin Total: 0.3 mg/dL (ref 0.0–1.2)
CO2: 21 mmol/L (ref 20–29)
Calcium: 9 mg/dL (ref 8.6–10.2)
Chloride: 103 mmol/L (ref 96–106)
Creatinine, Ser: 1.67 mg/dL — ABNORMAL HIGH (ref 0.76–1.27)
Globulin, Total: 3.2 g/dL (ref 1.5–4.5)
Glucose: 93 mg/dL (ref 70–99)
Potassium: 4.2 mmol/L (ref 3.5–5.2)
Sodium: 140 mmol/L (ref 134–144)
Total Protein: 7.3 g/dL (ref 6.0–8.5)
eGFR: 41 mL/min/1.73 — ABNORMAL LOW (ref >59)

## 2023-09-14 ENCOUNTER — Other Ambulatory Visit: Payer: Self-pay

## 2023-09-15 ENCOUNTER — Other Ambulatory Visit: Payer: Self-pay

## 2023-09-16 DIAGNOSIS — C61 Malignant neoplasm of prostate: Secondary | ICD-10-CM | POA: Diagnosis not present

## 2023-09-21 ENCOUNTER — Other Ambulatory Visit: Payer: Self-pay | Admitting: Family Medicine

## 2023-09-21 ENCOUNTER — Other Ambulatory Visit: Payer: Self-pay

## 2023-09-21 ENCOUNTER — Other Ambulatory Visit (HOSPITAL_COMMUNITY): Payer: Self-pay

## 2023-09-22 ENCOUNTER — Other Ambulatory Visit: Payer: Self-pay

## 2023-09-22 MED ORDER — LANTUS SOLOSTAR 100 UNIT/ML ~~LOC~~ SOPN
22.0000 [IU] | PEN_INJECTOR | Freq: Every day | SUBCUTANEOUS | 2 refills | Status: AC
  Filled 2023-09-22 – 2023-09-30 (×2): qty 15, 68d supply, fill #0
  Filled 2023-12-07: qty 15, 68d supply, fill #1
  Filled 2024-02-08 (×2): qty 15, 68d supply, fill #2

## 2023-09-28 ENCOUNTER — Other Ambulatory Visit: Payer: Self-pay

## 2023-09-30 ENCOUNTER — Other Ambulatory Visit (HOSPITAL_COMMUNITY): Payer: Self-pay

## 2023-09-30 ENCOUNTER — Other Ambulatory Visit: Payer: Self-pay

## 2023-10-05 ENCOUNTER — Other Ambulatory Visit: Payer: Self-pay

## 2023-10-06 DIAGNOSIS — Z794 Long term (current) use of insulin: Secondary | ICD-10-CM | POA: Diagnosis not present

## 2023-10-06 DIAGNOSIS — E119 Type 2 diabetes mellitus without complications: Secondary | ICD-10-CM | POA: Diagnosis not present

## 2023-10-08 ENCOUNTER — Other Ambulatory Visit: Payer: Self-pay | Admitting: Family Medicine

## 2023-10-08 DIAGNOSIS — E119 Type 2 diabetes mellitus without complications: Secondary | ICD-10-CM | POA: Diagnosis not present

## 2023-10-08 DIAGNOSIS — Z794 Long term (current) use of insulin: Secondary | ICD-10-CM | POA: Diagnosis not present

## 2023-10-12 ENCOUNTER — Other Ambulatory Visit: Payer: Self-pay

## 2023-10-12 DIAGNOSIS — C61 Malignant neoplasm of prostate: Secondary | ICD-10-CM | POA: Diagnosis not present

## 2023-10-12 DIAGNOSIS — R351 Nocturia: Secondary | ICD-10-CM | POA: Diagnosis not present

## 2023-10-12 DIAGNOSIS — N1832 Chronic kidney disease, stage 3b: Secondary | ICD-10-CM | POA: Diagnosis not present

## 2023-10-12 DIAGNOSIS — N403 Nodular prostate with lower urinary tract symptoms: Secondary | ICD-10-CM | POA: Diagnosis not present

## 2023-10-12 DIAGNOSIS — R972 Elevated prostate specific antigen [PSA]: Secondary | ICD-10-CM | POA: Diagnosis not present

## 2023-10-12 DIAGNOSIS — E559 Vitamin D deficiency, unspecified: Secondary | ICD-10-CM | POA: Diagnosis not present

## 2023-10-12 DIAGNOSIS — R3912 Poor urinary stream: Secondary | ICD-10-CM | POA: Diagnosis not present

## 2023-10-12 DIAGNOSIS — R399 Unspecified symptoms and signs involving the genitourinary system: Secondary | ICD-10-CM | POA: Diagnosis not present

## 2023-10-13 DIAGNOSIS — E1122 Type 2 diabetes mellitus with diabetic chronic kidney disease: Secondary | ICD-10-CM | POA: Diagnosis not present

## 2023-10-13 DIAGNOSIS — I2782 Chronic pulmonary embolism: Secondary | ICD-10-CM | POA: Diagnosis not present

## 2023-10-13 DIAGNOSIS — N1832 Chronic kidney disease, stage 3b: Secondary | ICD-10-CM | POA: Diagnosis not present

## 2023-10-13 DIAGNOSIS — Z794 Long term (current) use of insulin: Secondary | ICD-10-CM | POA: Diagnosis not present

## 2023-10-13 DIAGNOSIS — E1142 Type 2 diabetes mellitus with diabetic polyneuropathy: Secondary | ICD-10-CM | POA: Diagnosis not present

## 2023-10-13 DIAGNOSIS — M1990 Unspecified osteoarthritis, unspecified site: Secondary | ICD-10-CM | POA: Diagnosis not present

## 2023-10-13 DIAGNOSIS — C9001 Multiple myeloma in remission: Secondary | ICD-10-CM | POA: Diagnosis not present

## 2023-10-13 DIAGNOSIS — E785 Hyperlipidemia, unspecified: Secondary | ICD-10-CM | POA: Diagnosis not present

## 2023-10-13 DIAGNOSIS — F325 Major depressive disorder, single episode, in full remission: Secondary | ICD-10-CM | POA: Diagnosis not present

## 2023-10-20 DIAGNOSIS — I129 Hypertensive chronic kidney disease with stage 1 through stage 4 chronic kidney disease, or unspecified chronic kidney disease: Secondary | ICD-10-CM | POA: Diagnosis not present

## 2023-10-20 DIAGNOSIS — E559 Vitamin D deficiency, unspecified: Secondary | ICD-10-CM | POA: Diagnosis not present

## 2023-10-20 DIAGNOSIS — E1122 Type 2 diabetes mellitus with diabetic chronic kidney disease: Secondary | ICD-10-CM | POA: Diagnosis not present

## 2023-10-20 DIAGNOSIS — C61 Malignant neoplasm of prostate: Secondary | ICD-10-CM | POA: Diagnosis not present

## 2023-10-20 DIAGNOSIS — N1832 Chronic kidney disease, stage 3b: Secondary | ICD-10-CM | POA: Diagnosis not present

## 2023-10-20 DIAGNOSIS — C9000 Multiple myeloma not having achieved remission: Secondary | ICD-10-CM | POA: Diagnosis not present

## 2023-10-20 DIAGNOSIS — D631 Anemia in chronic kidney disease: Secondary | ICD-10-CM | POA: Diagnosis not present

## 2023-10-20 DIAGNOSIS — N2581 Secondary hyperparathyroidism of renal origin: Secondary | ICD-10-CM | POA: Diagnosis not present

## 2023-11-06 DIAGNOSIS — Z794 Long term (current) use of insulin: Secondary | ICD-10-CM | POA: Diagnosis not present

## 2023-11-06 DIAGNOSIS — E119 Type 2 diabetes mellitus without complications: Secondary | ICD-10-CM | POA: Diagnosis not present

## 2023-11-16 ENCOUNTER — Other Ambulatory Visit: Payer: Self-pay

## 2023-11-16 DIAGNOSIS — R590 Localized enlarged lymph nodes: Secondary | ICD-10-CM

## 2023-11-16 DIAGNOSIS — C9001 Multiple myeloma in remission: Secondary | ICD-10-CM

## 2023-11-16 DIAGNOSIS — D0439 Carcinoma in situ of skin of other parts of face: Secondary | ICD-10-CM

## 2023-11-16 DIAGNOSIS — R221 Localized swelling, mass and lump, neck: Secondary | ICD-10-CM

## 2023-11-17 ENCOUNTER — Inpatient Hospital Stay: Attending: Hematology

## 2023-11-17 DIAGNOSIS — D649 Anemia, unspecified: Secondary | ICD-10-CM | POA: Insufficient documentation

## 2023-11-17 DIAGNOSIS — Z794 Long term (current) use of insulin: Secondary | ICD-10-CM | POA: Insufficient documentation

## 2023-11-17 DIAGNOSIS — C61 Malignant neoplasm of prostate: Secondary | ICD-10-CM | POA: Diagnosis not present

## 2023-11-17 DIAGNOSIS — E1122 Type 2 diabetes mellitus with diabetic chronic kidney disease: Secondary | ICD-10-CM | POA: Insufficient documentation

## 2023-11-17 DIAGNOSIS — E1142 Type 2 diabetes mellitus with diabetic polyneuropathy: Secondary | ICD-10-CM | POA: Insufficient documentation

## 2023-11-17 DIAGNOSIS — C9001 Multiple myeloma in remission: Secondary | ICD-10-CM | POA: Insufficient documentation

## 2023-11-17 DIAGNOSIS — N183 Chronic kidney disease, stage 3 unspecified: Secondary | ICD-10-CM | POA: Insufficient documentation

## 2023-11-17 DIAGNOSIS — Z79899 Other long term (current) drug therapy: Secondary | ICD-10-CM | POA: Insufficient documentation

## 2023-11-17 DIAGNOSIS — R221 Localized swelling, mass and lump, neck: Secondary | ICD-10-CM

## 2023-11-17 DIAGNOSIS — Z7982 Long term (current) use of aspirin: Secondary | ICD-10-CM | POA: Diagnosis not present

## 2023-11-17 DIAGNOSIS — E559 Vitamin D deficiency, unspecified: Secondary | ICD-10-CM | POA: Insufficient documentation

## 2023-11-17 DIAGNOSIS — D0439 Carcinoma in situ of skin of other parts of face: Secondary | ICD-10-CM

## 2023-11-17 DIAGNOSIS — L989 Disorder of the skin and subcutaneous tissue, unspecified: Secondary | ICD-10-CM | POA: Diagnosis not present

## 2023-11-17 DIAGNOSIS — R590 Localized enlarged lymph nodes: Secondary | ICD-10-CM

## 2023-11-17 DIAGNOSIS — Z86711 Personal history of pulmonary embolism: Secondary | ICD-10-CM | POA: Diagnosis not present

## 2023-11-17 LAB — CBC WITH DIFFERENTIAL (CANCER CENTER ONLY)
Abs Immature Granulocytes: 0.01 K/uL (ref 0.00–0.07)
Basophils Absolute: 0 K/uL (ref 0.0–0.1)
Basophils Relative: 0 %
Eosinophils Absolute: 0.1 K/uL (ref 0.0–0.5)
Eosinophils Relative: 4 %
HCT: 42.8 % (ref 39.0–52.0)
Hemoglobin: 13.8 g/dL (ref 13.0–17.0)
Immature Granulocytes: 0 %
Lymphocytes Relative: 29 %
Lymphs Abs: 1 K/uL (ref 0.7–4.0)
MCH: 28.5 pg (ref 26.0–34.0)
MCHC: 32.2 g/dL (ref 30.0–36.0)
MCV: 88.4 fL (ref 80.0–100.0)
Monocytes Absolute: 0.4 K/uL (ref 0.1–1.0)
Monocytes Relative: 13 %
Neutro Abs: 1.8 K/uL (ref 1.7–7.7)
Neutrophils Relative %: 54 %
Platelet Count: 174 K/uL (ref 150–400)
RBC: 4.84 MIL/uL (ref 4.22–5.81)
RDW: 15.9 % — ABNORMAL HIGH (ref 11.5–15.5)
WBC Count: 3.3 K/uL — ABNORMAL LOW (ref 4.0–10.5)
nRBC: 0 % (ref 0.0–0.2)

## 2023-11-17 LAB — CMP (CANCER CENTER ONLY)
ALT: 23 U/L (ref 0–44)
AST: 18 U/L (ref 15–41)
Albumin: 3.7 g/dL (ref 3.5–5.0)
Alkaline Phosphatase: 68 U/L (ref 38–126)
Anion gap: 4 — ABNORMAL LOW (ref 5–15)
BUN: 26 mg/dL — ABNORMAL HIGH (ref 8–23)
CO2: 28 mmol/L (ref 22–32)
Calcium: 8.8 mg/dL — ABNORMAL LOW (ref 8.9–10.3)
Chloride: 106 mmol/L (ref 98–111)
Creatinine: 1.74 mg/dL — ABNORMAL HIGH (ref 0.61–1.24)
GFR, Estimated: 39 mL/min — ABNORMAL LOW (ref >60)
Glucose, Bld: 115 mg/dL — ABNORMAL HIGH (ref 70–99)
Potassium: 4.2 mmol/L (ref 3.5–5.1)
Sodium: 138 mmol/L (ref 135–145)
Total Bilirubin: 0.4 mg/dL (ref 0.0–1.2)
Total Protein: 7.5 g/dL (ref 6.5–8.1)

## 2023-11-18 LAB — KAPPA/LAMBDA LIGHT CHAINS
Kappa free light chain: 40.2 mg/L — ABNORMAL HIGH (ref 3.3–19.4)
Kappa, lambda light chain ratio: 1.66 — ABNORMAL HIGH (ref 0.26–1.65)
Lambda free light chains: 24.2 mg/L (ref 5.7–26.3)

## 2023-11-20 LAB — MULTIPLE MYELOMA PANEL, SERUM
Albumin SerPl Elph-Mcnc: 3.4 g/dL (ref 2.9–4.4)
Albumin/Glob SerPl: 1 (ref 0.7–1.7)
Alpha 1: 0.2 g/dL (ref 0.0–0.4)
Alpha2 Glob SerPl Elph-Mcnc: 0.7 g/dL (ref 0.4–1.0)
B-Globulin SerPl Elph-Mcnc: 1 g/dL (ref 0.7–1.3)
Gamma Glob SerPl Elph-Mcnc: 1.6 g/dL (ref 0.4–1.8)
Globulin, Total: 3.5 g/dL (ref 2.2–3.9)
IgA: 365 mg/dL (ref 61–437)
IgG (Immunoglobin G), Serum: 1778 mg/dL — ABNORMAL HIGH (ref 603–1613)
IgM (Immunoglobulin M), Srm: 69 mg/dL (ref 15–143)
Total Protein ELP: 6.9 g/dL (ref 6.0–8.5)

## 2023-12-01 ENCOUNTER — Inpatient Hospital Stay (HOSPITAL_BASED_OUTPATIENT_CLINIC_OR_DEPARTMENT_OTHER): Admitting: Hematology

## 2023-12-01 DIAGNOSIS — C9001 Multiple myeloma in remission: Secondary | ICD-10-CM | POA: Diagnosis not present

## 2023-12-01 NOTE — Progress Notes (Signed)
 HEMATOLOGY ONCOLOGY PROGRESS NOTE  Date of service: 12/01/2023  Patient Care Team: Delbert Clam, MD as PCP - General (Family Medicine) Onesimo Emaline Brink, MD as Consulting Physician (Hematology) Claudene Muskrat, OD (Optometry)  CHIEF COMPLAINT/PURPOSE OF CONSULTATION: Follow-up for continued evaluation and management of multiple myeloma.  HISTORY OF PRESENTING ILLNESS: See previous notes.    SUMMARY OF ONCOLOGIC HISTORY: Oncology History   No history exists.    INTERVAL HISTORY: I connected with Douglas Rose. on 12/01/2023 at  3:30 PM EST by telephone and verified that I am speaking with the correct person using two identifiers.   I discussed the limitations, risks, security and privacy concerns of performing an evaluation and management service by telemedicine and the availability of in-person appointments. I also discussed with the patient that there may be a patient responsible charge related to this service. The patient expressed understanding and agreed to proceed.   Other persons participating in the visit and their role in the encounter: Medical Scribe, Alan Blowers.  Patient's location: Home Provider's location: [facility]   Chief Complaint: continued evaluation and management of multiple myeloma.  Douglas Rose was last seen by me on 07/28/2023; at the time Douglas Rose did not have any concerns and was doing well.    Today, Douglas Rose is feeling well and denies any new symptoms. Douglas Rose manages his neuropathy and denies any falls.   REVIEW OF SYSTEMS:   10 Point review of systems of done and is negative except as noted above.  MEDICAL HISTORY Past Medical History:  Diagnosis Date   Arterial insufficiency of lower extremity 09/01/2016   Overview:  Mild (2018)  Last Assessment & Plan:  Mild arterial insufficiency, Douglas Rose does not have classic symptoms of claudication. Advised daily foot checks and would consider further evaluation if new or worsening symptoms  Mild (2018)  Last Assessment & Plan:   asymptomatic.  We will follow-up as needed Formatting of this note might be different from the original. Mild (2018)  Last Assessment & Pla   Benign prostatic hyperplasia 05/02/2020   CAD (coronary artery disease) 2006   stent LAD 3.0x18 Cypher, occluded RCA   Cerumen impaction 10/21/2021   Last Assessment & Plan: Formatting of this note might be different from the original.  Right cerumen impaction cleared with irrigation.  Left with yellow appearing discharge,  patient reports Douglas Rose also recently been flushing it at home. Patient denies any pain.  Concern for immunocompromised, chronic otorrhea, will rx ofloxacin drops per formualry   Cervical lymphadenopathy 03/31/2016   Colitis 05/12/2016   Elevated PSA 01/24/2015   Last Assessment & Plan: Formatting of this note might be different from the original.  Reports Douglas Rose has a pending biopsy which will be rescheduled due to recent DVT.  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  Patient is up-to-date on Covid booster   Infected tooth 03/31/2016   Monoclonal gammopathy    Multiple myeloma in remission (HCC) 07/22/2016   Last Assessment & Plan:  Formatting of this note might be different from the original.  Patient remains in remission, followed by oncology.  Had labs drawn today  Peripheral neuropathy 07/22/2016   Last Assessment & Plan:  Overall improved. Patient has discontinued gabapentin , did not feel much difference  Feet and fingers.  Multifactorial, DM, Chemo, MM  Last Assessment & Plan:   Doing well on gabapentin  Formatting of this note might be different from the  original. Last Assessment & Plan:  Overall improved. Patient has discontinued gabapentin , did not feel much difference   Screening for malignant neoplasm of colon 05/02/2020   Stage 3 chronic kidney disease (HCC) 06/13/2016   Last Assessment & Plan:  Will check Cr/K today  June 2020, annual follow-up [place] [facility]  Last Assessment & Plan:   Recent labs reviewed- stable Formatting of this note might be different from the original. Last Assessment & Plan:  Will check Cr/K today   Type 2 diabetes mellitus (HCC) 07/21/2011   Last Assessment & Plan:   Diabetes improved with fasting glucoses in a safe range.  Continue current regimen. Formatting of this note might be different from the original. Last Assessment & Plan:  Patient's diabetes has been well controlled at baseline on 12 units of Lantus  daily.  Douglas Rose reports some hyperglycemia on the first few days after dexamethasone  with resolution by the end of the week. 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 Patient has discontinued gabapentin , did not feel much difference     Feet and fingers.  Multifactorial, DM, Chemo, MM     Last Assessment & Plan:    Doing well on gabapentin   Formatting of this note might be different from the original.  Last Assessment & Plan:   Overall improved. Patient has discontinued gabapentin , did not feel much difference     For   Vitamin D deficiency 05/02/2020    SURGICAL HISTORY Past Surgical History:  Procedure Laterality Date   CARDIAC CATHETERIZATION  2006   CORONARY ANGIOPLASTY WITH STENT PLACEMENT  2007   IR FLUORO GUIDE CV LINE RIGHT  05/20/2016   IR US  GUIDE VASC ACCESS RIGHT  05/20/2016    SOCIAL HISTORY Social History   Tobacco Use   Smoking status: Never   Smokeless tobacco: Never  Vaping Use   Vaping status: Never Used  Substance Use Topics   Alcohol use: No   Drug use: No    Social History   Social History  Narrative   Not on file    SOCIAL DRIVERS OF HEALTH SDOH Screenings   Food Insecurity: No Food Insecurity (08/04/2023)  Housing: Low Risk  (08/04/2023)  Transportation Needs: No Transportation Needs (08/04/2023)  Utilities: Not At Risk (08/04/2023)  Alcohol Screen: Low Risk  (08/04/2023)  Depression (PHQ2-9): Low Risk  (08/04/2023)  Financial Resource Strain: Low Risk  (08/04/2023)  Physical Activity: Sufficiently Active (08/04/2023)  Social Connections: Moderately Integrated (08/04/2023)  Stress: No Stress Concern Present (08/04/2023)  Tobacco Use: Low Risk  (09/08/2023)  Health Literacy: Adequate Health Literacy (08/04/2023)     FAMILY HISTORY Family History  Problem Relation Age of Onset   Arrhythmia Mother    Heart disease Mother    Diabetes Sister    Diabetes Brother    Cancer Brother    Stroke Father    Diabetes Brother      ALLERGIES: has no known allergies.  MEDICATIONS  Current Outpatient Medications  Medication Sig Dispense Refill   amLODipine  (NORVASC ) 10 MG tablet TAKE 1 TABLET EVERY DAY 90 tablet 3   amoxicillin  (AMOXIL ) 250 MG capsule  Once before dental procedures (Patient not taking: Reported on 09/08/2023) 5 capsule 0   aspirin  EC 81 MG tablet Take 1 tablet (81 mg total) by mouth daily. Swallow whole. 90 tablet 3   atorvastatin  (LIPITOR) 20 MG tablet TAKE 1 TABLET EVERY DAY 90 tablet 1   Cholecalciferol 10 MCG (400 UNIT) CAPS Take 1 capsule by mouth daily.     dapagliflozin  propanediol (FARXIGA ) 10 MG TABS tablet Take 1 tablet (10 mg total) by mouth daily before breakfast. 90 tablet 1   DULoxetine  (CYMBALTA ) 60 MG capsule TAKE 1 CAPSULE EVERY DAY FOR NEUROPATHY 90 capsule 3   LANTUS  SOLOSTAR 100 UNIT/ML Solostar Pen Inject 22 Units into the skin daily. 15 mL 2   lisinopril  (ZESTRIL ) 5 MG tablet TAKE 1 TABLET EVERY DAY (DOSE INCREASE) 90 tablet 1   metoprolol  succinate (TOPROL -XL) 100 MG 24 hr tablet TAKE 1 TABLET EVERY DAY 90 tablet 3   nitroGLYCERIN  (NITROSTAT ) 0.4  MG SL tablet Place 1 tablet (0.4 mg total) under the tongue every 5 (five) minutes x 3 doses as needed for chest pain. 30 tablet 12   tamsulosin  (FLOMAX ) 0.4 MG CAPS capsule TAKE 1 CAPSULE EVERY DAY 90 capsule 3   triamcinolone  cream (KENALOG ) 0.1 % Apply 1 Application topically 2 (two) times daily. 30 g 0   vitamin B-12 (CYANOCOBALAMIN) 1000 MCG tablet Take 1,000 mcg by mouth daily.     No current facility-administered medications for this visit.    PHYSICAL EXAMINATION TELEPHONE VISIT:  LABORATORY DATA:   I have reviewed the data as listed     Latest Ref Rng & Units 11/17/2023   11:21 AM 07/14/2023    1:48 PM 03/17/2023    1:39 PM  CBC EXTENDED  WBC 4.0 - 10.5 K/uL 3.3  4.2  3.6   RBC 4.22 - 5.81 MIL/uL 4.84  4.87  5.51   Hemoglobin 13.0 - 17.0 g/dL 86.1  86.1  84.7   HCT 39.0 - 52.0 % 42.8  41.7  46.8   Platelets 150 - 400 K/uL 174  176  196   NEUT# 1.7 - 7.7 K/uL 1.8  2.2  1.9   Lymph# 0.7 - 4.0 K/uL 1.0  1.4  1.1        Latest Ref Rng & Units 11/17/2023   11:21 AM 09/10/2023    9:15 AM 07/14/2023    1:48 PM  CMP  Glucose 70 - 99 mg/dL 884  93  898   BUN 8 - 23 mg/dL 26  23  24    Creatinine 0.61 - 1.24 mg/dL 8.25  8.32  8.27   Sodium 135 - 145 mmol/L 138  140  140   Potassium 3.5 - 5.1 mmol/L 4.2  4.2  4.1   Chloride 98 - 111 mmol/L 106  103  106   CO2 22 - 32 mmol/L 28  21  29    Calcium  8.9 - 10.3 mg/dL 8.8  9.0  8.9   Total Protein 6.5 - 8.1 g/dL 7.5  7.3  7.3   Total Bilirubin 0.0 - 1.2 mg/dL 0.4  0.3  0.4   Alkaline Phos 38 - 126 U/L 68  82  79   AST 15 - 41 U/L 18  17  18    ALT 0 - 44 U/L 23  20  26           RADIOGRAPHIC STUDIES: I have personally reviewed the radiological images as listed and agreed with the findings in the report. No results  found.  ASSESSMENT & PLAN:  78 y.o. male with  1) ISS Stage II IgG [place] multiple myeloma. Anemia + Renal insufficiency.  Bone survey neg for concerning bone lesions. significantly elevated kappa free light  chains 858, lambda 18.4, with a ratio of 46.65 . Douglas Rose was also noted to have an M spike of 1.7 g/dL with IFE showing IgG kappa monoclonal protein. Quantitative IgG level was increased to nearly 2500 mg/dL. 24-hour UPEP showed monoclonal protein of 1300 mg per 24 hours which constituted about 86% of all the urinary protein. Bone marrow biopsy showed 20-30% Restricted plasma cells consistent with multiple myeloma Cytogenetics/FISH  Showed +4, +11, and +17    Patient has been treated with CyBord X 2 cycles . Treatment interrupted due to severe c diff colitis with prolonged hospitalization and decline in performance status.  Patient is much improved and back to baseline now. M protein undetected with neg IFE Has been treated with 5 cycles of Rd  Had been on maintenance Revlimid  for more than 2 years until PEs in August 2023.   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 Had an element of obstructive uropathy due to BPH which has resolved. Creatinine continues to be stable.    3) Normocytic anemia likely related to multiple myeloma.-stable. No abdominal pain or discomfort over the right upper quadrant. No fevers or chills.   #4 right lower and middle lobe pulmonary embolism August 2023-Revlimid  possible provoking factor. currently off Revlimid . 4) DM2 - controlled -continue management per PC    5) Concern for skin cancer lesion on forehead    PLAN: - Discussed lab results on 12/01/2023 in detail with patient: -CBC looked stable -blood chemistry shows CKD -Calcium  levels are good -Myeloma Panel does not show any M-protein -Kappa Light Chains are slightly elevated but this is attributed to the CKD -No sign of myeloma progression on the labs or based on clinical symptoms. -Follow-up in 4 months  FOLLOW-UP in 4 months for labs and follow-up with Dr. Onesimo.  The total time spent in the appointment was 20o minutes* .  All of the patient's questions  were answered and the patient knows to call the clinic with any problems, questions, or concerns.  Emaline Onesimo MD MS AAHIVMS [facility] Hematology/Oncology Physician [facility]  *Total Encounter Time as defined by the Centers for Medicare and Medicaid Services includes, in addition to the face-to-face time of a patient visit (documented in the note above) non-face-to-face time: obtaining and reviewing outside history, ordering and reviewing medications, tests or procedures, care coordination (communications with other health care professionals or caregivers) and documentation in the medical record.  I, Alan Blowers, acting as a neurosurgeon for Emaline Onesimo, MD.,have documented all relevant documentation on the behalf of Emaline Onesimo, MD,as directed by  Emaline Onesimo, MD while in the presence of Emaline Onesimo, MD.  I have reviewed the above documentation for accuracy and completeness, and I agree with the above.  Sherril Heyward, MD

## 2023-12-07 ENCOUNTER — Other Ambulatory Visit: Payer: Self-pay

## 2023-12-10 ENCOUNTER — Telehealth: Payer: Self-pay

## 2023-12-10 NOTE — Telephone Encounter (Signed)
 Patient dropped of form in office saying that his patient assistance for his lantus  runs out 01/06/24. Does patient need to re apply his self?

## 2023-12-11 ENCOUNTER — Other Ambulatory Visit: Payer: Self-pay

## 2023-12-11 NOTE — Telephone Encounter (Signed)
 He did not drop off an application he dropped of the letter that was mailed to him informing him that it expires in December

## 2023-12-14 ENCOUNTER — Other Ambulatory Visit: Payer: Self-pay

## 2023-12-15 ENCOUNTER — Other Ambulatory Visit: Payer: Self-pay

## 2023-12-16 ENCOUNTER — Other Ambulatory Visit: Payer: Self-pay

## 2023-12-17 ENCOUNTER — Other Ambulatory Visit: Payer: Self-pay

## 2024-01-18 ENCOUNTER — Other Ambulatory Visit: Payer: Self-pay

## 2024-01-25 ENCOUNTER — Other Ambulatory Visit: Payer: Self-pay | Admitting: Family Medicine

## 2024-01-29 ENCOUNTER — Other Ambulatory Visit: Payer: Self-pay

## 2024-02-08 ENCOUNTER — Other Ambulatory Visit: Payer: Self-pay

## 2024-02-08 ENCOUNTER — Other Ambulatory Visit (HOSPITAL_COMMUNITY): Payer: Self-pay

## 2024-02-11 ENCOUNTER — Other Ambulatory Visit: Payer: Self-pay

## 2024-03-07 ENCOUNTER — Ambulatory Visit: Admitting: Family Medicine

## 2024-03-22 ENCOUNTER — Inpatient Hospital Stay: Attending: Hematology

## 2024-04-05 ENCOUNTER — Inpatient Hospital Stay: Admitting: Hematology

## 2024-08-09 ENCOUNTER — Ambulatory Visit
